# Patient Record
Sex: Male | Born: 1940 | Race: White | Hispanic: No | Smoking: Former smoker
Health system: Southern US, Community
[De-identification: ages and names within clinical notes are randomized; demographics above are authoritative.]

## PROBLEM LIST (undated history)

## (undated) DIAGNOSIS — I8393 Asymptomatic varicose veins of bilateral lower extremities: Secondary | ICD-10-CM

## (undated) DIAGNOSIS — F419 Anxiety disorder, unspecified: Secondary | ICD-10-CM

## (undated) DIAGNOSIS — Z9889 Other specified postprocedural states: Secondary | ICD-10-CM

## (undated) DIAGNOSIS — B029 Zoster without complications: Secondary | ICD-10-CM

## (undated) DIAGNOSIS — Z803 Family history of malignant neoplasm of breast: Secondary | ICD-10-CM

## (undated) DIAGNOSIS — M1990 Unspecified osteoarthritis, unspecified site: Secondary | ICD-10-CM

## (undated) DIAGNOSIS — R519 Headache, unspecified: Secondary | ICD-10-CM

## (undated) DIAGNOSIS — E039 Hypothyroidism, unspecified: Secondary | ICD-10-CM

## (undated) DIAGNOSIS — E785 Hyperlipidemia, unspecified: Secondary | ICD-10-CM

## (undated) DIAGNOSIS — N3281 Overactive bladder: Secondary | ICD-10-CM

## (undated) DIAGNOSIS — G4730 Sleep apnea, unspecified: Secondary | ICD-10-CM

## (undated) DIAGNOSIS — E079 Disorder of thyroid, unspecified: Secondary | ICD-10-CM

## (undated) DIAGNOSIS — C259 Malignant neoplasm of pancreas, unspecified: Secondary | ICD-10-CM

## (undated) DIAGNOSIS — I10 Essential (primary) hypertension: Secondary | ICD-10-CM

## (undated) DIAGNOSIS — Z801 Family history of malignant neoplasm of trachea, bronchus and lung: Secondary | ICD-10-CM

## (undated) DIAGNOSIS — Z8719 Personal history of other diseases of the digestive system: Secondary | ICD-10-CM

## (undated) DIAGNOSIS — J189 Pneumonia, unspecified organism: Secondary | ICD-10-CM

## (undated) DIAGNOSIS — F32A Depression, unspecified: Secondary | ICD-10-CM

## (undated) DIAGNOSIS — I7121 Aneurysm of the ascending aorta, without rupture: Secondary | ICD-10-CM

## (undated) DIAGNOSIS — I729 Aneurysm of unspecified site: Secondary | ICD-10-CM

## (undated) DIAGNOSIS — F329 Major depressive disorder, single episode, unspecified: Secondary | ICD-10-CM

## (undated) HISTORY — DX: Family history of malignant neoplasm of breast: Z80.3

## (undated) HISTORY — PX: ROTATOR CUFF REPAIR: SHX139

## (undated) HISTORY — DX: Depression, unspecified: F32.A

## (undated) HISTORY — DX: Family history of malignant neoplasm of trachea, bronchus and lung: Z80.1

## (undated) HISTORY — DX: Unspecified osteoarthritis, unspecified site: M19.90

## (undated) HISTORY — PX: JOINT REPLACEMENT: SHX530

## (undated) HISTORY — DX: Major depressive disorder, single episode, unspecified: F32.9

## (undated) HISTORY — DX: Hyperlipidemia, unspecified: E78.5

## (undated) HISTORY — PX: VARICOSE VEIN SURGERY: SHX832

## (undated) HISTORY — PX: ELBOW SURGERY: SHX618

## (undated) HISTORY — PX: BILATERAL CARPAL TUNNEL RELEASE: SHX6508

## (undated) HISTORY — PX: CATARACT EXTRACTION, BILATERAL: SHX1313

## (undated) HISTORY — PX: EYE SURGERY: SHX253

## (undated) MED FILL — Dexamethasone Sodium Phosphate Inj 100 MG/10ML: INTRAMUSCULAR | Qty: 1 | Status: AC

---
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
## 1999-02-02 ENCOUNTER — Ambulatory Visit (HOSPITAL_COMMUNITY): Admission: RE | Admit: 1999-02-02 | Discharge: 1999-02-02 | Payer: Self-pay | Admitting: Internal Medicine

## 2005-01-19 ENCOUNTER — Ambulatory Visit: Payer: Self-pay | Admitting: Internal Medicine

## 2005-01-25 ENCOUNTER — Ambulatory Visit: Payer: Self-pay | Admitting: Internal Medicine

## 2005-04-04 ENCOUNTER — Ambulatory Visit: Payer: Self-pay | Admitting: Internal Medicine

## 2006-04-24 ENCOUNTER — Ambulatory Visit: Payer: Self-pay | Admitting: Internal Medicine

## 2006-07-11 ENCOUNTER — Ambulatory Visit: Payer: Self-pay | Admitting: Internal Medicine

## 2006-07-11 LAB — CONVERTED CEMR LAB
AST: 29 units/L (ref 0–37)
Albumin: 3.8 g/dL (ref 3.5–5.2)
Alkaline Phosphatase: 94 units/L (ref 39–117)
Bilirubin, Direct: 0.2 mg/dL (ref 0.0–0.3)
Chol/HDL Ratio, serum: 5.1
Cholesterol: 191 mg/dL (ref 0–200)
LDL Cholesterol: 124 mg/dL — ABNORMAL HIGH (ref 0–99)
Total Bilirubin: 0.8 mg/dL (ref 0.3–1.2)
Total CK: 301 units/L (ref 7–195)
Triglyceride fasting, serum: 150 mg/dL — ABNORMAL HIGH (ref 0–149)
VLDL: 30 mg/dL (ref 0–40)

## 2006-12-25 ENCOUNTER — Ambulatory Visit: Payer: Self-pay | Admitting: Internal Medicine

## 2007-04-12 ENCOUNTER — Emergency Department (HOSPITAL_COMMUNITY): Admission: EM | Admit: 2007-04-12 | Discharge: 2007-04-12 | Payer: Self-pay | Admitting: Family Medicine

## 2007-06-13 ENCOUNTER — Encounter: Payer: Self-pay | Admitting: Internal Medicine

## 2007-10-04 ENCOUNTER — Ambulatory Visit: Payer: Self-pay | Admitting: Internal Medicine

## 2007-10-04 DIAGNOSIS — F411 Generalized anxiety disorder: Secondary | ICD-10-CM

## 2007-10-04 DIAGNOSIS — F329 Major depressive disorder, single episode, unspecified: Secondary | ICD-10-CM

## 2007-10-04 DIAGNOSIS — Z860100 Personal history of colon polyps, unspecified: Secondary | ICD-10-CM | POA: Insufficient documentation

## 2007-10-04 DIAGNOSIS — K219 Gastro-esophageal reflux disease without esophagitis: Secondary | ICD-10-CM

## 2007-10-04 DIAGNOSIS — E785 Hyperlipidemia, unspecified: Secondary | ICD-10-CM

## 2007-11-12 ENCOUNTER — Ambulatory Visit: Payer: Self-pay | Admitting: Internal Medicine

## 2007-11-20 ENCOUNTER — Ambulatory Visit: Payer: Self-pay | Admitting: Internal Medicine

## 2007-11-20 ENCOUNTER — Encounter: Payer: Self-pay | Admitting: Internal Medicine

## 2008-05-21 ENCOUNTER — Encounter: Payer: Self-pay | Admitting: Internal Medicine

## 2008-10-09 ENCOUNTER — Telehealth (INDEPENDENT_AMBULATORY_CARE_PROVIDER_SITE_OTHER): Payer: Self-pay | Admitting: *Deleted

## 2008-11-07 ENCOUNTER — Ambulatory Visit: Payer: Self-pay | Admitting: Internal Medicine

## 2008-11-07 DIAGNOSIS — K5730 Diverticulosis of large intestine without perforation or abscess without bleeding: Secondary | ICD-10-CM | POA: Insufficient documentation

## 2008-11-07 DIAGNOSIS — I10 Essential (primary) hypertension: Secondary | ICD-10-CM | POA: Insufficient documentation

## 2008-11-07 DIAGNOSIS — E0590 Thyrotoxicosis, unspecified without thyrotoxic crisis or storm: Secondary | ICD-10-CM | POA: Insufficient documentation

## 2008-11-12 ENCOUNTER — Encounter: Payer: Self-pay | Admitting: Internal Medicine

## 2008-11-27 ENCOUNTER — Ambulatory Visit: Payer: Self-pay | Admitting: Endocrinology

## 2008-12-30 ENCOUNTER — Encounter (HOSPITAL_COMMUNITY): Admission: RE | Admit: 2008-12-30 | Discharge: 2009-02-11 | Payer: Self-pay | Admitting: Endocrinology

## 2009-01-14 ENCOUNTER — Ambulatory Visit (HOSPITAL_COMMUNITY): Admission: RE | Admit: 2009-01-14 | Discharge: 2009-01-14 | Payer: Self-pay | Admitting: Endocrinology

## 2009-05-25 ENCOUNTER — Ambulatory Visit: Payer: Self-pay | Admitting: Internal Medicine

## 2009-05-25 DIAGNOSIS — R490 Dysphonia: Secondary | ICD-10-CM | POA: Insufficient documentation

## 2009-05-25 DIAGNOSIS — J309 Allergic rhinitis, unspecified: Secondary | ICD-10-CM | POA: Insufficient documentation

## 2009-06-11 ENCOUNTER — Telehealth: Payer: Self-pay | Admitting: Internal Medicine

## 2009-06-11 DIAGNOSIS — J029 Acute pharyngitis, unspecified: Secondary | ICD-10-CM | POA: Insufficient documentation

## 2009-06-25 ENCOUNTER — Encounter: Payer: Self-pay | Admitting: Internal Medicine

## 2009-07-06 ENCOUNTER — Ambulatory Visit: Payer: Self-pay | Admitting: Internal Medicine

## 2009-07-09 ENCOUNTER — Inpatient Hospital Stay (HOSPITAL_COMMUNITY): Admission: EM | Admit: 2009-07-09 | Discharge: 2009-07-11 | Payer: Self-pay | Admitting: Emergency Medicine

## 2009-07-15 ENCOUNTER — Ambulatory Visit: Payer: Self-pay | Admitting: Internal Medicine

## 2009-07-15 DIAGNOSIS — R5383 Other fatigue: Secondary | ICD-10-CM

## 2009-07-15 DIAGNOSIS — R5381 Other malaise: Secondary | ICD-10-CM

## 2009-07-15 DIAGNOSIS — E039 Hypothyroidism, unspecified: Secondary | ICD-10-CM

## 2009-11-30 ENCOUNTER — Ambulatory Visit: Payer: Self-pay | Admitting: Internal Medicine

## 2010-04-20 ENCOUNTER — Ambulatory Visit (HOSPITAL_BASED_OUTPATIENT_CLINIC_OR_DEPARTMENT_OTHER): Admission: RE | Admit: 2010-04-20 | Discharge: 2010-04-20 | Payer: Self-pay | Admitting: Orthopedic Surgery

## 2010-06-04 ENCOUNTER — Ambulatory Visit (HOSPITAL_BASED_OUTPATIENT_CLINIC_OR_DEPARTMENT_OTHER): Admission: RE | Admit: 2010-06-04 | Discharge: 2010-06-04 | Payer: Self-pay | Admitting: Orthopedic Surgery

## 2010-09-12 LAB — CONVERTED CEMR LAB
ALT: 20 units/L (ref 0–53)
Albumin: 3.9 g/dL (ref 3.5–5.2)
Alkaline Phosphatase: 80 units/L (ref 39–117)
Alkaline Phosphatase: 96 units/L (ref 39–117)
BUN: 13 mg/dL (ref 6–23)
Basophils Absolute: 0 10*3/uL (ref 0.0–0.1)
Basophils Relative: 0.1 % (ref 0.0–3.0)
Bilirubin, Direct: 0.1 mg/dL (ref 0.0–0.3)
Bilirubin, Direct: 0.1 mg/dL (ref 0.0–0.3)
CO2: 29 meq/L (ref 19–32)
Calcium: 9.1 mg/dL (ref 8.4–10.5)
Calcium: 9.2 mg/dL (ref 8.4–10.5)
Cholesterol: 239 mg/dL (ref 0–200)
Creatinine, Ser: 1.1 mg/dL (ref 0.4–1.5)
Direct LDL: 171.8 mg/dL
Eosinophils Absolute: 0.3 10*3/uL (ref 0.0–0.7)
Eosinophils Relative: 6.7 % — ABNORMAL HIGH (ref 0.0–5.0)
GFR calc non Af Amer: 64 mL/min
HCT: 40.2 % (ref 39.0–52.0)
HDL: 38.1 mg/dL — ABNORMAL LOW (ref >39.00)
Hemoglobin, Urine: NEGATIVE
Hemoglobin: 13.7 g/dL (ref 13.0–17.0)
Ketones, ur: NEGATIVE mg/dL
Leukocytes, UA: NEGATIVE
Lymphocytes Relative: 38.2 % (ref 12.0–46.0)
Lymphocytes Relative: 42.6 % (ref 12.0–46.0)
MCHC: 34.1 g/dL (ref 30.0–36.0)
MCV: 80.3 fL (ref 78.0–100.0)
Monocytes Absolute: 0.7 10*3/uL (ref 0.1–1.0)
Monocytes Relative: 11.7 % — ABNORMAL HIGH (ref 3.0–11.0)
Neutrophils Relative %: 39.2 % — ABNORMAL LOW (ref 43.0–77.0)
Neutrophils Relative %: 41.8 % — ABNORMAL LOW (ref 43.0–77.0)
PSA: 2.31 ng/mL (ref 0.10–4.00)
PSA: 2.72 ng/mL (ref 0.10–4.00)
Platelets: 191 10*3/uL (ref 150.0–400.0)
RBC: 4.95 M/uL (ref 4.22–5.81)
RDW: 14.3 % (ref 11.5–14.6)
Sodium: 140 meq/L (ref 135–145)
Specific Gravity, Urine: 1.015 (ref 1.000–1.030)
TSH: 0.05 microintl units/mL — ABNORMAL LOW (ref 0.35–5.50)
TSH: 4.17 microintl units/mL (ref 0.35–5.50)
Total Bilirubin: 1 mg/dL (ref 0.3–1.2)
Total CHOL/HDL Ratio: 6
Total Protein: 6.9 g/dL (ref 6.0–8.3)
Triglycerides: 110 mg/dL (ref 0.0–149.0)
Triglycerides: 132 mg/dL (ref 0–149)
Urine Glucose: NEGATIVE mg/dL
Urobilinogen, UA: 0.2 (ref 0.0–1.0)
VLDL: 26 mg/dL (ref 0–40)
WBC: 5 10*3/uL (ref 4.5–10.5)
WBC: 5.1 10*3/uL (ref 4.5–10.5)

## 2010-10-04 NOTE — Op Note (Signed)
NAME:  Angel Keller, Angel Keller                 ACCOUNT NO.:  [id number]  MEDICAL RECORD NO.:  [id number]          PATIENT TYPE:  AMB  LOCATION:  DSC                          FACILITY:  [facility]  PHYSICIAN:  Cindee Salt, M.D.       DATE OF BIRTH:  18-Mar-1941  DATE OF PROCEDURE:  06/04/2010 DATE OF DISCHARGE:                              OPERATIVE REPORT   PREOPERATIVE DIAGNOSES:  Carpal tunnel syndrome, right hand, cubital tunnel syndrome, right elbow.  POSTOPERATIVE DIAGNOSIS:  Carpal tunnel syndrome, right hand, cubital tunnel syndrome, right elbow.  OPERATION:  Decompression median nerve at the wrist, decompression ulnar nerve at the elbow, right arm.  SURGEON:  Cindee Salt, MD  ANESTHESIA:  General with local infiltration.  ANESTHESIOLOGIST:  Janetta Hora. Gelene Mink, MD  HISTORY:  The patient is a 70 year old male with a history of carpal tunnel syndrome, cubital tunnel syndrome, nerve conductions positive. This has not responded to conservative treatment.  He has elected to undergo surgical decompression.  Pre, peri, and postoperative course are discussed along with risks and complications.  He is aware that there is no guarantee with the surgery, possibility of infection, recurrence injury to arteries, nerves, tendons, incomplete relief of symptoms, dystrophy.  In the preoperative area, the patient is seen.  The extremity marked by both the patient and surgeon.  Antibiotics given.  PROCEDURE:  The patient was brought to the operating room where a general anesthetic was carried out without difficulty under the direction of Dr. Gelene Mink.  He was prepped using ChloraPrep, supine position, right arm free.  A 3-minute dry time was allowed.  Time-out taken confirming the patient and procedure.  The limb was exsanguinated with an Esmarch bandage, tourniquet placed high, and the arm was inflated 250 mmHg.  A longitudinal incision was made in the right palm, carried down through the  subcutaneous tissue.  Bleeders were electrocauterized.  Palmar fascia was split.  Superficial palmar arch identified.  The flexor tendon to the ring little finger identified. The ulnar side of median nerve carpal retinaculum was incised with sharp dissection.  A right-angle and Sewall retractor were placed between skin and forearm fascia.  The fascia was released for approximately 1.5 cm proximal to the wrist crease under direct vision.  The canal was explored.  Air compression to the nerve was apparent.  No further lesions were identified.  The wound was irrigated.  The skin was then closed with interrupted 5-0 Vicryl Rapide sutures.  Local infiltration was given with 0.25% Marcaine without epinephrine, approximately 5 mL was used.  A separate incision was then made over the medial epicondyle of the right elbow, carried down through the subcutaneous tissue. Bleeders again electrocauterized with bipolar.  Dissection carried down to [employer].  This was identified.  This was released in its most posterior aspect.  This allowed visualization of the ulnar nerve.  A subcutaneous dissection was then carried between the fascia, the subcutaneous fatty tissue.  Two retractors were placed allowing visualization of the flexor carpi ulnaris.  Flexor carpi ulnaris fasciotomy was then performed.  The muscle was then carefully dissected along its planes  separating the 2 muscle bellies.  The deep fascia was then released after placement of a KMI carpal tunnel retractor and the deep fascia cut with an ENT scissors.  The dissection was then carried proximally.  The knee retractor was placed after a subcutaneous dissection to the fascia.  The ulnar nerve was identified proximally. The KMI retractor placed on top of the nerve.  This was then retracted posteriorly and the fascia was released again with the ENT scissors for approximately 5-6 cm proximally.  The elbow placed through full flexion. No  subluxation to the ulnar nerve was noted.  The wounds were copiously irrigated with saline.  The Osborne fascia was then sutured to the skin, posterior flap with figure-of-eight 4-0 Vicryl sutures, and the skin with a subcuticular 4-0 Vicryl Rapide suture.  A local infiltration was then given with 0.25% Marcaine without epinephrine.  A sterile compressive dressing and long-arm splint applied with the elbow flexed approximately 30 degrees.  On deflation of the tourniquet, all fingers immediately pinked.  He was taken to the recovery room for observation in a satisfactory condition.  He will be discharged home to return to the [facility] of [place] in 1 week on Vicodin.          ______________________________ Cindee Salt, M.D.     GK/MEDQ  D:  06/04/2010  T:  06/05/2010  Job:  [id number]  cc:   Corwin Levins, MD  Electronically Signed by Cindee Salt M.D. on 10/04/2010 12:05:22 PM

## 2010-10-04 NOTE — Op Note (Signed)
NAME:  Angel Keller, Angel Keller                 ACCOUNT NO.:  [id number]  MEDICAL RECORD NO.:  [id number]          PATIENT TYPE:  AMB  LOCATION:  DSC                          FACILITY:  [facility]  PHYSICIAN:  Cindee Salt, M.D.       DATE OF BIRTH:  May 16, 1941  DATE OF PROCEDURE:  04/20/2010 DATE OF DISCHARGE:                              OPERATIVE REPORT  PREOPERATIVE DIAGNOSES:  Carpal tunnel syndrome, left hand.  Cubital tunnel syndrome, left elbow.  POSTOPERATIVE DIAGNOSES:  Carpal tunnel syndrome, left hand.  Cubital tunnel syndrome, left elbow.  OPERATION:  Decompression of left median nerve at the wrist and the ulnar nerve at the elbow.  SURGEON:  Cindee Salt, MD  ANESTHESIA:  General with local infiltration.  ANESTHESIOLOGIST:  Quita Skye. Krista Blue, MD  HISTORY:  The patient is a 70 year old male with history of carpal tunnel syndrome and cubital tunnel syndrome, left arm.  EMG nerve conductions are positive.  He does show moderate atrophy to the thenar intrinsics and intrinsics to his hand, but no clawing.  He is admitted for decompression, possible transposition to the ulnar nerve, decompression to the median nerve at the wrist.  Pre and postoperative course have been discussed along with risks and complications.  He is aware that there is no guarantee with surgery, possibility of infection, recurrence, injury to arteries, nerves, and tendons, incomplete relief of symptoms, and dystrophy.  In the preoperative area, the patient is seen.  The extremity marked by both the patient and surgeon.  Antibiotic given.  PROCEDURE:  The patient is brought to the operating room where a general anesthetic was carried out without difficulty under the direction of Dr. Krista Blue.  He was prepped using DuraPrep, supine position, left arm free. A 3-minute dry time was allowed.  Time-out taken confirming the patient and procedure.  A longitudinal incision was made in the palm, carried down through  subcutaneous tissue after exsanguination of the limb with an Esmarch bandage and inflation of tourniquet to 250 mmHg.  Bleeders were electrocauterized with bipolar.  The palmar fascia was split, superficial palmar arch identified, flexor tendon of the ring and little finger identified to the ulnar side of median nerve.  Carpal retinaculum was incised with sharp dissection.  Right angle and Sewall retractor were placed between skin and forearm fascia.  Fascia released for approximately a centimeter and half proximal to the wrist crease under direct vision.  Canal was explored.  Air compression to the nerve was apparent.  No further lesions were identified.  The wound was irrigated. Skin was closed with interrupted 5-0 Vicryl Rapide sutures.  A separate incision was then made in the medial epicondyle of the left elbow, carried down through subcutaneous tissue.  Bleeders again electrocauterized with bipolar.  The dissection was carried down to the posterior aspect of Osborne fascia.  This was incised, left attached to the epicondyle.  The ulnar nerve was identified.  A subcutaneous dissection between fat and fascia was then proceeded distally over the flexor carpi ulnaris.  Two knee retractors were then placed.  A fasciotomy was then performed to the flexor carpi ulnaris.  The muscle belly was then split with a blunt dissection.  A KMI skid for carpal tunnel release was then placed between the ulnar nerve and the deep fascia of the flexor carpi ulnaris using an angled ENT straight scissors, the fascia was released distally.  This was done for approximately 6 cm distally.  The dissection was then carried proximally again with blunt and sharp dissection.  The subcutaneous tissue was dissected free of the overlying fascia and the ulnar nerve proximally. The fascia was then released after placing the [facility] skid between the ulnar nerve and the fascia.  This was done with the angled ENT  scissors. The nerve was released proximally and distally to palpation.  With flexion of the elbow, no subluxation was noted.  The wound was irrigated and the subcutaneous tissue was closed with interrupted 2-0 Vicryl and skin with interrupted 4-0 Vicryl Rapide sutures.  Sterile compressive dressing and long-arm splint with the fingers free was applied.  The elbow was placed in approximately 40 degrees of flexion.  On deflation of the tourniquet, all fingers immediately pinked.  He was taken to the recovery room for observation in satisfactory condition.  He will be discharged home to return to [facility] [place] in 1 week on Vicodin.          ______________________________ Cindee Salt, M.D.    GK/MEDQ  D:  04/20/2010  T:  04/21/2010  Job:  [id number]  cc:   Oliver Barre, MD  Electronically Signed by Cindee Salt M.D. on 10/04/2010 12:05:19 PM

## 2010-10-27 LAB — BASIC METABOLIC PANEL
BUN: 14 mg/dL (ref 6–23)
CO2: 24 mEq/L (ref 19–32)
Chloride: 106 mEq/L (ref 96–112)
Glucose, Bld: 143 mg/dL — ABNORMAL HIGH (ref 70–99)
Potassium: 4.2 mEq/L (ref 3.5–5.1)
Sodium: 138 mEq/L (ref 135–145)

## 2010-10-28 LAB — BASIC METABOLIC PANEL
BUN: 16 mg/dL (ref 6–23)
CO2: 26 mEq/L (ref 19–32)
Chloride: 106 mEq/L (ref 96–112)
GFR calc non Af Amer: >60 mL/min (ref >60)
Glucose, Bld: 108 mg/dL — ABNORMAL HIGH (ref 70–99)
Potassium: 4.2 mEq/L (ref 3.5–5.1)
Sodium: 137 mEq/L (ref 135–145)

## 2010-11-17 LAB — BASIC METABOLIC PANEL
BUN: 10 mg/dL (ref 6–23)
BUN: 14 mg/dL (ref 6–23)
BUN: 8 mg/dL (ref 6–23)
CO2: 28 mEq/L (ref 19–32)
Calcium: 8.8 mg/dL (ref 8.4–10.5)
Chloride: 101 mEq/L (ref 96–112)
Chloride: 98 mEq/L (ref 96–112)
Creatinine, Ser: 1.69 mg/dL — ABNORMAL HIGH (ref 0.4–1.5)
Creatinine, Ser: 2.09 mg/dL — ABNORMAL HIGH (ref 0.4–1.5)
GFR calc non Af Amer: 40 mL/min — ABNORMAL LOW (ref >60)
Glucose, Bld: 102 mg/dL — ABNORMAL HIGH (ref 70–99)
Glucose, Bld: 122 mg/dL — ABNORMAL HIGH (ref 70–99)
Glucose, Bld: 97 mg/dL (ref 70–99)
Potassium: 3.7 mEq/L (ref 3.5–5.1)
Potassium: 3.9 mEq/L (ref 3.5–5.1)

## 2010-11-17 LAB — GLUCOSE, CAPILLARY: Glucose-Capillary: 135 mg/dL — ABNORMAL HIGH (ref 70–99)

## 2010-11-17 LAB — URINALYSIS, ROUTINE W REFLEX MICROSCOPIC
Glucose, UA: NEGATIVE mg/dL
Hgb urine dipstick: NEGATIVE
Ketones, ur: 15 mg/dL — AB
Protein, ur: NEGATIVE mg/dL
pH: 5 (ref 5.0–8.0)

## 2010-11-17 LAB — CBC
HCT: 38.3 % — ABNORMAL LOW (ref 39.0–52.0)
HCT: 40.7 % (ref 39.0–52.0)
MCHC: 35.3 g/dL (ref 30.0–36.0)
MCV: 87.2 fL (ref 78.0–100.0)
MCV: 88.7 fL (ref 78.0–100.0)
Platelets: 157 10*3/uL (ref 150–400)
Platelets: 167 10*3/uL (ref 150–400)
WBC: 4.6 10*3/uL (ref 4.0–10.5)
WBC: 5.2 10*3/uL (ref 4.0–10.5)

## 2010-11-17 LAB — CK TOTAL AND CKMB (NOT AT ARMC)
CK, MB: 46 ng/mL — ABNORMAL HIGH (ref 0.3–4.0)
Relative Index: 1 (ref 0.0–2.5)

## 2010-11-17 LAB — HEPATIC FUNCTION PANEL
AST: 153 U/L — ABNORMAL HIGH (ref 0–37)
Albumin: 4 g/dL (ref 3.5–5.2)
Total Protein: 7.5 g/dL (ref 6.0–8.3)

## 2010-11-17 LAB — CARDIAC PANEL(CRET KIN+CKTOT+MB+TROPI): Relative Index: 0.9 (ref 0.0–2.5)

## 2010-11-17 LAB — LIPID PANEL
HDL: 44 mg/dL (ref >39)
Total CHOL/HDL Ratio: 5.8 RATIO
Triglycerides: 264 mg/dL — ABNORMAL HIGH (ref <150)
VLDL: 53 mg/dL — ABNORMAL HIGH (ref 0–40)

## 2010-11-17 LAB — DIFFERENTIAL
Basophils Relative: 1 % (ref 0–1)
Eosinophils Absolute: 0.4 10*3/uL (ref 0.0–0.7)
Eosinophils Relative: 8 % — ABNORMAL HIGH (ref 0–5)
Lymphs Abs: 1.3 10*3/uL (ref 0.7–4.0)
Monocytes Relative: 10 % (ref 3–12)

## 2010-11-17 LAB — POCT CARDIAC MARKERS
Myoglobin, poc: >500 ng/mL (ref 12–200)
Troponin i, poc: <0.05 ng/mL (ref 0.00–0.09)

## 2010-11-17 LAB — MAGNESIUM: Magnesium: 2.7 mg/dL — ABNORMAL HIGH (ref 1.5–2.5)

## 2010-11-17 LAB — TSH: TSH: 137.15 u[IU]/mL — ABNORMAL HIGH (ref 0.350–4.500)

## 2014-10-22 ENCOUNTER — Encounter: Payer: Self-pay | Admitting: Internal Medicine

## 2015-03-03 ENCOUNTER — Encounter: Payer: Self-pay | Admitting: Internal Medicine

## 2015-05-07 ENCOUNTER — Encounter: Payer: Self-pay | Admitting: Internal Medicine

## 2015-09-08 ENCOUNTER — Other Ambulatory Visit: Payer: Self-pay | Admitting: Family Medicine

## 2015-09-08 DIAGNOSIS — R519 Headache, unspecified: Secondary | ICD-10-CM

## 2015-09-08 DIAGNOSIS — R51 Headache: Principal | ICD-10-CM

## 2015-09-14 ENCOUNTER — Ambulatory Visit
Admission: RE | Admit: 2015-09-14 | Discharge: 2015-09-14 | Disposition: A | Discharge status: Home / Self Care | Payer: Medicare Other | Source: Ambulatory Visit | Attending: Family Medicine | Admitting: Family Medicine

## 2015-09-14 DIAGNOSIS — R51 Headache: Principal | ICD-10-CM

## 2015-09-14 DIAGNOSIS — R519 Headache, unspecified: Secondary | ICD-10-CM

## 2015-09-15 ENCOUNTER — Emergency Department (HOSPITAL_COMMUNITY)
Admission: EM | Admit: 2015-09-15 | Discharge: 2015-09-15 | Disposition: A | Discharge status: Home / Self Care | Payer: Medicare Other | Attending: Emergency Medicine | Admitting: Emergency Medicine

## 2015-09-15 ENCOUNTER — Telehealth (HOSPITAL_COMMUNITY): Payer: Self-pay

## 2015-09-15 ENCOUNTER — Encounter (HOSPITAL_COMMUNITY): Payer: Self-pay | Admitting: Emergency Medicine

## 2015-09-15 DIAGNOSIS — R42 Dizziness and giddiness: Secondary | ICD-10-CM | POA: Insufficient documentation

## 2015-09-15 DIAGNOSIS — M542 Cervicalgia: Secondary | ICD-10-CM | POA: Insufficient documentation

## 2015-09-15 DIAGNOSIS — R51 Headache: Secondary | ICD-10-CM | POA: Insufficient documentation

## 2015-09-15 DIAGNOSIS — R519 Headache, unspecified: Secondary | ICD-10-CM

## 2015-09-15 DIAGNOSIS — Z79899 Other long term (current) drug therapy: Secondary | ICD-10-CM | POA: Insufficient documentation

## 2015-09-15 DIAGNOSIS — E079 Disorder of thyroid, unspecified: Secondary | ICD-10-CM | POA: Diagnosis not present

## 2015-09-15 DIAGNOSIS — I10 Essential (primary) hypertension: Secondary | ICD-10-CM | POA: Diagnosis not present

## 2015-09-15 DIAGNOSIS — Z7982 Long term (current) use of aspirin: Secondary | ICD-10-CM | POA: Diagnosis not present

## 2015-09-15 HISTORY — DX: Essential (primary) hypertension: I10

## 2015-09-15 HISTORY — DX: Disorder of thyroid, unspecified: E07.9

## 2015-09-15 NOTE — Discharge Instructions (Signed)
As discussed, our interventional radiologist will contact you in the morning regarding the next steps in evaluation for your aneurism.  Please call them if you are not contacted.  Return here for concerning changes in your condition.

## 2015-09-15 NOTE — ED Provider Notes (Signed)
CSN: [id number]     Arrival date & time 09/15/15  1422 History   First MD Initiated Contact with Patient 09/15/15 1553     Chief Complaint  Patient presents with  . Headache     HPI   Patient presents with concern of ongoing headache. Notably, the patient has been evaluated by his primary care physician, had MRI performed yesterday due to his headache. Patient is here with 2 children, who assist with the history of present illness. Over the past 3 months the patient has had new headache. Headache occurs with associated posterior head discomfort, and neck soreness, when the patient is exercising. With rest, symptoms resolved. During the exacerbations, there is no vision change, vomiting, asymmetric weakness. Prior to 3 months ago, the patient had no similar issues. Currently the patient has no symptoms. Yesterday, the patient had MRI. Today, he was informed that he has abnormal results and he is here for evaluation.   Past Medical History  Diagnosis Date  . Hypertension   . Thyroid disease    History reviewed. No pertinent past surgical history. History reviewed. No pertinent family history. Social History  Substance Use Topics  . Smoking status: Never Smoker   . Smokeless tobacco: None  . Alcohol Use: No    Review of Systems  Constitutional:       Per HPI, otherwise negative  HENT:       Per HPI, otherwise negative  Respiratory:       Per HPI, otherwise negative  Cardiovascular:       Per HPI, otherwise negative  Gastrointestinal: Negative for vomiting.  Endocrine:       Negative aside from HPI  Genitourinary:       Neg aside from HPI   Musculoskeletal:       Per HPI, otherwise negative  Skin: Negative.   Neurological: Positive for light-headedness and headaches. Negative for syncope.      Allergies  Review of patient's allergies indicates no known allergies.  Home Medications   Prior to Admission medications   Medication Sig Start Date End Date  Taking? Authorizing Provider  aspirin EC 325 MG tablet Take 325 mg by mouth daily.   Yes Historical Provider, MD  bisoprolol-hydrochlorothiazide (ZIAC) 2.5-6.25 MG tablet Take 1 tablet by mouth daily. 06/20/15  Yes Historical Provider, MD  citalopram (CELEXA) 20 MG tablet Take 20 mg by mouth daily. 06/17/15  Yes Historical Provider, MD  Cyanocobalamin (B-12 PO) Take 1 tablet by mouth daily.   Yes Historical Provider, MD  levothyroxine (SYNTHROID, LEVOTHROID) 150 MCG tablet Take 150 mcg by mouth daily. 09/07/15  Yes Historical Provider, MD  lovastatin (MEVACOR) 20 MG tablet Take 20 mg by mouth daily. 07/25/15  Yes Historical Provider, MD   BP 142/81 mmHg  Pulse 59  Temp(Src) 98.7 F (37.1 C) (Oral)  Resp 20  SpO2 97% Physical Exam  Constitutional: He is oriented to person, place, and time. He appears well-developed. No distress.  HENT:  Head: Normocephalic and atraumatic.  Eyes: Conjunctivae and EOM are normal.  Cardiovascular: Normal rate and regular rhythm.   Pulmonary/Chest: Effort normal. No stridor. No respiratory distress.  Abdominal: He exhibits no distension.  Musculoskeletal: He exhibits no edema.  Neurological: He is alert and oriented to person, place, and time.  Patient moves all extremities spontaneously, has appropriate grip strength, is awake, alert, oriented appropriately, with clear speech, no facial asymmetry  Skin: Skin is warm and dry.  Psychiatric: He has a normal mood and affect.  Nursing note and vitals reviewed.   ED Course  Procedures (including critical care time) Labs Review Labs Reviewed - No data to display  Imaging Review Mr Virgel Paling [id number] Contrast  09/15/2015  ADDENDUM REPORT: 09/15/2015 09:45 ADDENDUM: Study discussed by telephone with Dr. Jeneen Rinks LITTLE on 09/15/2015 at 0935 hours. I am arranging a follow-up conversation between Dr. [name] and Neurointerventionalist Dr. Juliet Rude. Electronically Signed   By: Genevie Ann M.D.   On: 09/15/2015  09:45  09/15/2015  CLINICAL DATA:  75 year old male with headaches after exercising at the gym, especially lifting weights. Initial encounter. EXAM: MRA HEAD WITHOUT CONTRAST TECHNIQUE: Angiographic images of the Circle of Willis were obtained using MRA technique without intravenous contrast. COMPARISON:  None. FINDINGS: Visible brain parenchyma with no ventriculomegaly or intracranial mass effect evident. Cerebral volume is within normal limits for age. Antegrade flow in the posterior fossa with codominant distal vertebral arteries. Normal PICA origins and vertebrobasilar junction. Normal AICA origins, basilar artery, SCA and PCA origins (shared right PCA and SCA origin common normal variant). Posterior communicating arteries are diminutive or absent. Bilateral PCA branches are within normal limits. Antegrade flow in both ICA siphons. No siphon stenosis. Ophthalmic artery origins appear normal. Patent carotid termini. Normal MCA and ACA origins. There is ectasia about the anterior communicating artery, but no associated saccular aneurysm. Mild ACA tortuosity. Visualized bilateral ACA branches otherwise are within normal limits. The left MCA M1 segment, bifurcation, and left MCA branches and visualized left MCA branches are within normal limits. Right MCA origin is within normal limits. Arising from the mid M1 segment and directed inferiorly and posteriorly there is a small 3 mm saccular aneurysm (series 2, image 76 and series 8, image 9). Distal to this the right MCA bifurcation appears normal. Visualized right MCA branches are within normal limits. IMPRESSION: 1. 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 Recommend Neuroendovascular consultation. 2. Ectasia of the anterior communicating artery but no additional intracranial aneurysm identified. 3. No intracranial stenosis. No intracranial atherosclerosis is evident. Electronically Signed: By: Genevie Ann M.D. On: 09/14/2015  17:41   I have personally reviewed and evaluated these images and lab results as part of my medical decision-making.  Update: I discussed the patient's case, MRI with our neuro interventionist. He indicates the patient can follow-up in the morning, and that they will arrange angiography for him, and he will be seen in the clinic tomorrow.  Update: I had a very lengthy conversation with the patient, 2 children about aneurysms today's findings, my conversation with neuro interventional radiology. I offered admission for monitoring overnight, with next day interventional evaluation versus discharge with outpatient follow-up, arranged by interventional radiology. Patient is asymptomatic, states that he would like to go home, will follow up tomorrow, states that he understands return precautions.   MDM   Final diagnoses:  Bad headache   aneurysm  Previously well male presents with 3 months of ongoing exertional headache. Here is awake, alert, asymptomatic. Patient has MRI performed as an outpatient demonstrated 3 mm MCA aneurysm. No evidence for bleed and absent any ongoing neurologic symptoms, there is low suspicion for occult bleed. After discussion with neuro interventional radiology, the patient was discharged in stable condition with next morning follow-up.    Carmin Muskrat, MD 09/15/15 [phone]

## 2015-09-15 NOTE — Telephone Encounter (Signed)
Called to schedule, left a message for pt to call back. AW

## 2015-09-15 NOTE — ED Notes (Signed)
Pt had MRI for HA, and tingling in arm which showed brain aneurysm; pt here for further eval

## 2015-09-16 ENCOUNTER — Ambulatory Visit (HOSPITAL_COMMUNITY)
Admission: RE | Admit: 2015-09-16 | Discharge: 2015-09-16 | Disposition: A | Discharge status: Home / Self Care | Payer: Medicare Other | Source: Ambulatory Visit | Attending: Interventional Radiology | Admitting: Interventional Radiology

## 2015-09-16 ENCOUNTER — Other Ambulatory Visit (HOSPITAL_COMMUNITY): Payer: Self-pay | Admitting: Interventional Radiology

## 2015-09-16 DIAGNOSIS — I671 Cerebral aneurysm, nonruptured: Secondary | ICD-10-CM

## 2015-09-19 ENCOUNTER — Emergency Department (HOSPITAL_COMMUNITY)
Admission: EM | Admit: 2015-09-19 | Discharge: 2015-09-19 | Disposition: A | Discharge status: Home / Self Care | Payer: Medicare Other | Attending: Emergency Medicine | Admitting: Emergency Medicine

## 2015-09-19 ENCOUNTER — Encounter (HOSPITAL_COMMUNITY): Payer: Self-pay | Admitting: Emergency Medicine

## 2015-09-19 DIAGNOSIS — B0233 Zoster keratitis: Secondary | ICD-10-CM | POA: Diagnosis not present

## 2015-09-19 DIAGNOSIS — E039 Hypothyroidism, unspecified: Secondary | ICD-10-CM | POA: Insufficient documentation

## 2015-09-19 DIAGNOSIS — Z79899 Other long term (current) drug therapy: Secondary | ICD-10-CM | POA: Diagnosis not present

## 2015-09-19 DIAGNOSIS — I10 Essential (primary) hypertension: Secondary | ICD-10-CM | POA: Diagnosis not present

## 2015-09-19 DIAGNOSIS — R21 Rash and other nonspecific skin eruption: Secondary | ICD-10-CM | POA: Diagnosis present

## 2015-09-19 DIAGNOSIS — Z7982 Long term (current) use of aspirin: Secondary | ICD-10-CM | POA: Insufficient documentation

## 2015-09-19 HISTORY — DX: Zoster without complications: B02.9

## 2015-09-19 HISTORY — DX: Aneurysm of unspecified site: I72.9

## 2015-09-19 MED ORDER — OXYCODONE-ACETAMINOPHEN 5-325 MG PO TABS: 1.0000 | ORAL_TABLET | Freq: Four times a day (QID) | ORAL | Status: DC | PRN

## 2015-09-19 MED ORDER — FLUORESCEIN SODIUM 1 MG OP STRP
1.0000 | ORAL_STRIP | Freq: Once | OPHTHALMIC | Status: AC
  Administered 2015-09-19: 1 via OPHTHALMIC
  Filled 2015-09-19: qty 1

## 2015-09-19 MED ORDER — VALACYCLOVIR HCL 1 G PO TABS: 1000.0000 mg | ORAL_TABLET | Freq: Three times a day (TID) | ORAL | Status: DC

## 2015-09-19 MED ORDER — PREDNISONE 20 MG PO TABS: 40.0000 mg | ORAL_TABLET | Freq: Every day | ORAL | Status: DC

## 2015-09-19 MED ORDER — ERYTHROMYCIN 5 MG/GM OP OINT: TOPICAL_OINTMENT | OPHTHALMIC | Status: DC

## 2015-09-19 MED ORDER — ONDANSETRON 4 MG PO TBDP
4.0000 mg | ORAL_TABLET | Freq: Once | ORAL | Status: AC
  Administered 2015-09-19: 4 mg via ORAL
  Filled 2015-09-19: qty 1

## 2015-09-19 MED ORDER — OXYCODONE-ACETAMINOPHEN 5-325 MG PO TABS
1.0000 | ORAL_TABLET | Freq: Once | ORAL | Status: AC
Start: 2015-09-19 — End: 2015-09-19
  Administered 2015-09-19: 1 via ORAL
  Filled 2015-09-19: qty 1

## 2015-09-19 MED ORDER — VALACYCLOVIR HCL 500 MG PO TABS
1000.0000 mg | ORAL_TABLET | Freq: Once | ORAL | Status: AC
  Administered 2015-09-19: 1000 mg via ORAL
  Filled 2015-09-19: qty 2

## 2015-09-19 MED ORDER — TETRACAINE HCL 0.5 % OP SOLN
1.0000 [drp] | Freq: Once | OPHTHALMIC | Status: AC
  Administered 2015-09-19: 1 [drp] via OPHTHALMIC
  Filled 2015-09-19: qty 2

## 2015-09-19 NOTE — ED Provider Notes (Signed)
CSN: [id number]     Arrival date & time 09/19/15  1313 History  By signing my name below, I, [name], attest that this documentation has been prepared under the direction and in the presence of Margarita Mail, PA-C. Electronically Signed: [name], ED Scribe. 09/19/2015. 2:33 PM.    Chief Complaint  Patient presents with  . Herpes Zoster  . Aneurysm   The history is provided by the patient, the spouse and a relative. No language interpreter was used.    HPI Comments: Angel Keller is a 75 y.o. male who presents to the Emergency Department complaining of a worsening, blistering, rash to his superior scalp onset 1 week ago. He was initially seen on 1/31 for an ongoing HA and had an MRI performed demonstrating a 11mm MCA aneurysm and has a clipping surgery scheduled with Dr. Estanislado Pandy. Pt now notes a worsening, 8/10, painful rash to his scalp. He is not on any steroid medications. Pt is currently taking medication for his HTN.   PCP: Dr. Tamsen Roers ([place])  Past Medical History  Diagnosis Date  . Hypertension   . Thyroid disease   . Shingles   . Aneurysm ([place])    History reviewed. No pertinent past surgical history. No family history on file. Social History  Substance Use Topics  . Smoking status: Never Smoker   . Smokeless tobacco: None  . Alcohol Use: No    Review of Systems  Skin: Positive for color change, rash and wound.  All other systems reviewed and are negative.   Allergies  Review of patient's allergies indicates no known allergies.  Home Medications   Prior to Admission medications   Medication Sig Start Date End Date Taking? Authorizing Provider  aspirin EC 325 MG tablet Take 325 mg by mouth daily.    Historical Provider, MD  bisoprolol-hydrochlorothiazide (ZIAC) 2.5-6.25 MG tablet Take 1 tablet by mouth daily. 06/20/15   Historical Provider, MD  citalopram (CELEXA) 20 MG tablet Take 20 mg by mouth daily. 06/17/15   Historical  Provider, MD  Cyanocobalamin (B-12 PO) Take 1 tablet by mouth daily.    Historical Provider, MD  levothyroxine (SYNTHROID, LEVOTHROID) 150 MCG tablet Take 150 mcg by mouth daily. 09/07/15   Historical Provider, MD  lovastatin (MEVACOR) 20 MG tablet Take 20 mg by mouth daily. 07/25/15   Historical Provider, MD   BP 145/77 mmHg  Pulse 56  Temp(Src) 97.2 F (36.2 C) (Oral)  Resp 16  Ht 5\' 10"  (1.778 m)  Wt 165 lb 8 oz (75.07 kg)  BMI 23.75 kg/m2  SpO2 97%    Physical Exam  Constitutional: He is oriented to person, place, and time. He appears well-developed.  HENT:  Head: Normocephalic and atraumatic.  Eyes: EOM are normal. Right eye exhibits no chemosis and no exudate. Left eye exhibits no chemosis and no exudate. Right conjunctiva is injected. Left conjunctiva is not injected.  Slit lamp exam:      The right eye shows no corneal flare, no corneal ulcer and no fluorescein uptake.       The left eye shows no corneal flare and no corneal ulcer.  No dendritic lesions.  Pressure  OD:13 OS 13  Neck: Normal range of motion.  Cardiovascular: Normal rate.   Pulmonary/Chest: Effort normal. No respiratory distress.  Abdominal: Soft.  Musculoskeletal: Normal range of motion.  Neurological: He is alert and oriented to person, place, and time.  Skin: Skin is warm and dry. Rash noted. There  is erythema.  Vesicular and crusting eruption on the left parietal and temporal scalp region extending to the face over the right eye  Psychiatric: He has a normal mood and affect.  Nursing note and vitals reviewed.   ED Course  Procedures  DIAGNOSTIC STUDIES: Oxygen Saturation is 97% on RA, normal by my interpretation.    COORDINATION OF CARE: 2:31 PM Discussed next steps with pt and his family. He agreed to the plan.   Labs Review Labs Reviewed - No data to display  Imaging Review No results found. I have personally reviewed and evaluated these images and lab results as part of my medical  decision-making.   EKG Interpretation None      MDM   Final diagnoses:  Herpes zoster keratitis or keratoconjunctivitis    Patient with shingles eruption in the C2 dermatome over the right parietal and temporal region. No dendritic lesions. However, patient had significant relief with tetracaine. No signs of uveitis or iritis. Spoke with Dr. Barnet Pall in 2 advises prednisone, Valtrex, pain medication, will see the patient tomorrow at 2 PM. He will also get erythromycin and in the eye 3 times daily. Discussed reasons to seek immediate medical care with the patient. Her safe for discharge at this time. I personally performed the services described in this documentation, which was scribed in my presence. The recorded information has been reviewed and is accurate.        Margarita Mail, PA-C 09/19/15 1920  Merrily Pew, MD 09/20/15 [phone]

## 2015-09-19 NOTE — ED Notes (Signed)
Pt c/o rash that developed on scalp and eye area this am. Blisters noted on scalp and eye area.

## 2015-09-19 NOTE — ED Notes (Signed)
Declined W/C at D/C and was escorted to lobby by RN. 

## 2015-09-19 NOTE — Discharge Instructions (Signed)
Shingles Shingles, which is also known as herpes zoster, is an infection that causes a painful skin rash and fluid-filled blisters. Shingles is not related to genital herpes, which is a sexually transmitted infection.   Shingles only develops in people who:  Have had chickenpox.  Have received the chickenpox vaccine. (This is rare.) CAUSES Shingles is caused by varicella-zoster virus (VZV). This is the same virus that causes chickenpox. After exposure to VZV, the virus stays in the body in an inactive (dormant) state. Shingles develops if the virus reactivates. This can happen many years after the initial exposure to VZV. It is not known what causes this virus to reactivate. RISK FACTORS People who have had chickenpox or received the chickenpox vaccine are at risk for shingles. Infection is more common in people who:  Are older than age 24.  Have a weakened defense (immune) system, such as those with HIV, AIDS, or cancer.  Are taking medicines that weaken the immune system, such as transplant medicines.  Are under great stress. SYMPTOMS Early symptoms of this condition include itching, tingling, and pain in an area on your skin. Pain may be described as burning, stabbing, or throbbing. A few days or weeks after symptoms start, a painful red rash appears, usually on one side of the body in a bandlike or beltlike pattern. The rash eventually turns into fluid-filled blisters that break open, scab over, and dry up in about 2-3 weeks. At any time during the infection, you may also develop:  A fever.  Chills.  A headache.  An upset stomach. DIAGNOSIS This condition is diagnosed with a skin exam. Sometimes, skin or fluid samples are taken from the blisters before a diagnosis is made. These samples are examined under a microscope or sent to a lab for testing. TREATMENT There is no specific cure for this condition. Your health care provider will probably prescribe medicines to help you  manage pain, recover more quickly, and avoid long-term problems. Medicines may include:  Antiviral drugs.  Anti-inflammatory drugs.  Pain medicines. If the area involved is on your face, you may be referred to a specialist, such as an eye doctor (ophthalmologist) or an ear, nose, and throat (ENT) doctor to help you avoid eye problems, chronic pain, or disability. HOME CARE INSTRUCTIONS Medicines  Take medicines only as directed by your health care provider.  Apply an anti-itch or numbing cream to the affected area as directed by your health care provider. Blister and Rash Care  Take a cool bath or apply cool compresses to the area of the rash or blisters as directed by your health care provider. This may help with pain and itching.  Keep your rash covered with a loose bandage (dressing). Wear loose-fitting clothing to help ease the pain of material rubbing against the rash.  Keep your rash and blisters clean with mild soap and cool water or as directed by your health care provider.  Check your rash every day for signs of infection. These include redness, swelling, and pain that lasts or increases.  Do not pick your blisters.  Do not scratch your rash. General Instructions  Rest as directed by your health care provider.  Keep all follow-up visits as directed by your health care provider. This is important.  Until your blisters scab over, your infection can cause chickenpox in people who have never had it or been vaccinated against it. To prevent this from happening, avoid contact with other people, especially:  Babies.  Pregnant women.  Children  who have eczema.  Elderly people who have transplants.  People who have chronic illnesses, such as leukemia or AIDS. SEEK MEDICAL CARE IF:  Your pain is not relieved with prescribed medicines.  Your pain does not get better after the rash heals.  Your rash looks infected. Signs of infection include redness, swelling, and pain  that lasts or increases. SEEK IMMEDIATE MEDICAL CARE IF:  The rash is on your face or nose.  You have facial pain, pain around your eye area, or loss of feeling on one side of your face.  You have ear pain or you have ringing in your ear.  You have loss of taste.  Your condition gets worse.   This information is not intended to replace advice given to you by your health care provider. Make sure you discuss any questions you have with your health care provider.   Document Released: 08/01/2005 Document Revised: 08/22/2014 Document Reviewed: 06/12/2014 Elsevier Interactive Patient Education 2016 Elsevier Inc.  Herpes Keratitis The term "keratitis" refers to an inflammation of the cornea (clear covering at the front of the eye). When you look at the color of a person's eye, you are looking through the clear cornea to the colored iris, which is inside the eye. The cornea is an extremely sensitive tissue. This is why you immediately blink when something touches your eye, or even if you think something is going to touch your eye.  One of the most common forms of keratitis is produced by the herpes virus. There are different types of herpes infections. Herpes Simplex Virus 1 (HSV-1) usually causes cold sores and eye infections. HSV-2 is usually, but not always, the cause of sexually transmitted herpes infections. There is another type of herpes virus called Herpes Zoster, which is the cause of a painful rash known as "shingles." When shingles affects the face, there may be an associated herpes infection or inflammation in the eye on the same side as the rash. This makes it very important to have your eye checked, but this is typically not a herpes keratitis infection.  Most people, at one time or another, have had some form of herpes virus in their system. Stress, fatigue, sunlight, surgery, illness, the use of certain drugs like steroids, and certain foods are all factors that may make the herpes  virus flare up again, causing a herpes-related illness. One of the places that the virus can cause inflammation is on the surface of the cornea. For this reason, if you have an active herpes infection, such as a cold sore, it is very important to avoid contact to your eyes with your fingers, since the virus may spread. SYMPTOMS  Herpes keratitis almost always occurs in only one eye at a time. Herpes keratitis causes:  Pain in the affected eye.  Extreme light sensitivity.  Eye is typically red and inflamed.  Vision may be blurred.  Eye may tear excessively. DIAGNOSIS  Herpes Keratitis has a very specific pattern of inflammation on the cornea. It is so typical that an eye specialist can tell almost immediately what is wrong, by simply looking at the eye with a special microscope and with a small amount of special green dye placed in the eye. Herpes keratitis forms a branch-like pattern of inflammation known as a "dendrite." For this reason, it is also called "dendritic keratitis." RELATED COMPLICATIONS Without treatment, herpes keratitis can cause scarring of the cornea and loss of vision. It can come back even after it has been successfully treated.  This usually happens during a generalized illness, when all herpes viruses are prone to flare up. The inflammation can also spread to the inside of the eye, causing scarring. The side effects of such scarring can result in complications and such conditions as glaucoma and cataracts. It is very important to know exactly what type of keratitis you have, and to know the cause of any red eye. This is because certain medicines, which are commonly used as eye drops, can make a herpes keratitis infection much worse very fast. Steroid drops (prednisone) for instance, can rapidly make a herpes infection worse, if used at the wrong time, while the virus is still active. TREATMENT  Medicines are available for the treatment of herpes keratitis. The medicines used  will depend on how much of the eye is involved. It may also depend on whether there is live virus still in the eye.  Your caregiver may want you to have a complete physical examination, to be sure that nothing else is going on in your body that allowed the herpes virus to flare up. Even after successful treatment, one third of people with herpes virus will have a recurrence.  HOME CARE INSTRUCTIONS   Take all medicines as instructed. Take pain medicines only as prescribed by your caregiver. Do not self medicate with eye drops, unless instructed to do so.  Keep all appointments as instructed. Remember, this illness is a leading cause of blindness. MAKE SURE YOU:   Understand these instructions.  Will watch your condition.  Will get help right away if you are not doing well or get worse.   This information is not intended to replace advice given to you by your health care provider. Make sure you discuss any questions you have with your health care provider.   Document Released: 04/26/2001 Document Revised: 10/24/2011 Document Reviewed: 01/19/2015 Elsevier Interactive Patient Education [employer].

## 2015-09-22 ENCOUNTER — Other Ambulatory Visit (HOSPITAL_COMMUNITY): Payer: Self-pay | Admitting: Interventional Radiology

## 2015-09-22 DIAGNOSIS — M542 Cervicalgia: Secondary | ICD-10-CM

## 2015-09-22 DIAGNOSIS — R202 Paresthesia of skin: Secondary | ICD-10-CM

## 2015-09-22 DIAGNOSIS — R200 Anesthesia of skin: Secondary | ICD-10-CM

## 2015-09-28 ENCOUNTER — Ambulatory Visit (HOSPITAL_COMMUNITY)
Admission: RE | Admit: 2015-09-28 | Discharge: 2015-09-28 | Disposition: A | Discharge status: Home / Self Care | Payer: Medicare Other | Source: Ambulatory Visit | Attending: Interventional Radiology | Admitting: Interventional Radiology

## 2015-09-28 DIAGNOSIS — R531 Weakness: Secondary | ICD-10-CM | POA: Insufficient documentation

## 2015-09-28 DIAGNOSIS — R200 Anesthesia of skin: Secondary | ICD-10-CM | POA: Insufficient documentation

## 2015-09-28 DIAGNOSIS — M5124 Other intervertebral disc displacement, thoracic region: Secondary | ICD-10-CM | POA: Insufficient documentation

## 2015-09-28 DIAGNOSIS — M47892 Other spondylosis, cervical region: Secondary | ICD-10-CM | POA: Diagnosis not present

## 2015-09-28 DIAGNOSIS — M79602 Pain in left arm: Secondary | ICD-10-CM | POA: Diagnosis not present

## 2015-09-28 DIAGNOSIS — M542 Cervicalgia: Secondary | ICD-10-CM | POA: Diagnosis not present

## 2015-09-28 DIAGNOSIS — R202 Paresthesia of skin: Secondary | ICD-10-CM

## 2015-09-30 ENCOUNTER — Telehealth (HOSPITAL_COMMUNITY): Payer: Self-pay | Admitting: Interventional Radiology

## 2015-09-30 NOTE — Telephone Encounter (Signed)
Called pt, left VM for him to call me back. JM

## 2015-10-01 ENCOUNTER — Other Ambulatory Visit (HOSPITAL_COMMUNITY): Payer: Self-pay | Admitting: Interventional Radiology

## 2015-10-01 DIAGNOSIS — I671 Cerebral aneurysm, nonruptured: Secondary | ICD-10-CM

## 2015-10-12 ENCOUNTER — Other Ambulatory Visit: Payer: Self-pay | Admitting: Radiology

## 2015-10-14 ENCOUNTER — Encounter (HOSPITAL_COMMUNITY)
Admission: RE | Admit: 2015-10-14 | Discharge: 2015-10-14 | Disposition: A | Discharge status: Home / Self Care | Payer: Medicare Other | Source: Ambulatory Visit | Attending: Interventional Radiology | Admitting: Interventional Radiology

## 2015-10-14 ENCOUNTER — Encounter (HOSPITAL_COMMUNITY): Payer: Self-pay

## 2015-10-14 DIAGNOSIS — Z7982 Long term (current) use of aspirin: Secondary | ICD-10-CM | POA: Insufficient documentation

## 2015-10-14 DIAGNOSIS — I10 Essential (primary) hypertension: Secondary | ICD-10-CM | POA: Diagnosis not present

## 2015-10-14 DIAGNOSIS — Z87891 Personal history of nicotine dependence: Secondary | ICD-10-CM | POA: Insufficient documentation

## 2015-10-14 DIAGNOSIS — Z01812 Encounter for preprocedural laboratory examination: Secondary | ICD-10-CM | POA: Diagnosis not present

## 2015-10-14 DIAGNOSIS — Z01818 Encounter for other preprocedural examination: Secondary | ICD-10-CM | POA: Diagnosis not present

## 2015-10-14 DIAGNOSIS — I671 Cerebral aneurysm, nonruptured: Secondary | ICD-10-CM | POA: Diagnosis not present

## 2015-10-14 DIAGNOSIS — E039 Hypothyroidism, unspecified: Secondary | ICD-10-CM | POA: Diagnosis not present

## 2015-10-14 DIAGNOSIS — Z79899 Other long term (current) drug therapy: Secondary | ICD-10-CM | POA: Insufficient documentation

## 2015-10-14 HISTORY — DX: Pneumonia, unspecified organism: J18.9

## 2015-10-14 HISTORY — DX: Overactive bladder: N32.81

## 2015-10-14 HISTORY — DX: Other specified postprocedural states: Z98.890

## 2015-10-14 HISTORY — DX: Hypothyroidism, unspecified: E03.9

## 2015-10-14 HISTORY — DX: Personal history of other diseases of the digestive system: Z87.19

## 2015-10-14 HISTORY — DX: Anxiety disorder, unspecified: F41.9

## 2015-10-14 LAB — CBC WITH DIFFERENTIAL/PLATELET
BASOS ABS: 0 10*3/uL (ref 0.0–0.1)
Basophils Relative: 0 %
EOS ABS: 0.2 10*3/uL (ref 0.0–0.7)
EOS PCT: 5 %
HCT: 41.2 % (ref 39.0–52.0)
Hemoglobin: 14 g/dL (ref 13.0–17.0)
Lymphocytes Relative: 32 %
Lymphs Abs: 1.5 10*3/uL (ref 0.7–4.0)
MCH: 28.5 pg (ref 26.0–34.0)
MCHC: 34 g/dL (ref 30.0–36.0)
MCV: 83.9 fL (ref 78.0–100.0)
Monocytes Absolute: 0.5 10*3/uL (ref 0.1–1.0)
Monocytes Relative: 10 %
Neutro Abs: 2.4 10*3/uL (ref 1.7–7.7)
Neutrophils Relative %: 53 %
PLATELETS: 146 10*3/uL — AB (ref 150–400)
RBC: 4.91 MIL/uL (ref 4.22–5.81)
RDW: 14.4 % (ref 11.5–15.5)
WBC: 4.6 10*3/uL (ref 4.0–10.5)

## 2015-10-14 LAB — COMPREHENSIVE METABOLIC PANEL
ALT: 17 U/L (ref 17–63)
ANION GAP: 8 (ref 5–15)
AST: 27 U/L (ref 15–41)
Albumin: 3.7 g/dL (ref 3.5–5.0)
Alkaline Phosphatase: 58 U/L (ref 38–126)
BILIRUBIN TOTAL: 0.8 mg/dL (ref 0.3–1.2)
BUN: 18 mg/dL (ref 6–20)
CALCIUM: 9.6 mg/dL (ref 8.9–10.3)
CO2: 28 mmol/L (ref 22–32)
Chloride: 104 mmol/L (ref 101–111)
Creatinine, Ser: 1.18 mg/dL (ref 0.61–1.24)
GFR, EST NON AFRICAN AMERICAN: 59 mL/min — AB (ref >60)
Glucose, Bld: 92 mg/dL (ref 65–99)
POTASSIUM: 4.1 mmol/L (ref 3.5–5.1)
SODIUM: 140 mmol/L (ref 135–145)
TOTAL PROTEIN: 6.9 g/dL (ref 6.5–8.1)

## 2015-10-14 LAB — PROTIME-INR
INR: 1.11 (ref 0.00–1.49)
PROTHROMBIN TIME: 14.5 s (ref 11.6–15.2)

## 2015-10-14 LAB — APTT: APTT: 27 s (ref 24–37)

## 2015-10-14 NOTE — Pre-Procedure Instructions (Signed)
Angel Keller  10/14/2015      [facility] [address], [place] [address] Phone: [phone] Fax: [phone]    Your procedure is scheduled on Wednesday October 21 2015.  Report to [facility] Admitting at 700 A.M.  Call this number if you have problems the morning of surgery:  [phone]   Remember:  Do not eat food or drink liquids after midnight Tuesday March 7th .  Take these medicines the morning of surgery with A SIP OF WATER citalopram (celexa), levothyroxine (synthroid), Plavix, aspirin.   Start Plavix on the 3rd per Dr. Arlean Hopping office, continue aspirin.  STOP: ALL Vitamins, Supplements, Effient and Herbal Medications, Fish Oils, NSAIDs (Nonsteroidal Anti-inflammatories such as Ibuprofen, Aleve, or Advil), and Goody's/BC Powders 7 days prior to surgery, until after surgery as directed by your physician.     Do not wear jewelry, make-up or nail polish.  Do not wear lotions, powders, or perfumes.  You may wear deodorant.  Do not shave 48 hours prior to surgery.  Men may shave face and neck.  Do not bring valuables to the hospital.  [facility] is not responsible for any belongings or valuables.  Contacts, dentures or bridgework may not be worn into surgery.  Leave your suitcase in the car.  After surgery it may be brought to your room.  For patients admitted to the hospital, discharge time will be determined by your treatment team.  Patients discharged the day of surgery will not be allowed to drive home.        Preparing for Surgery at [facility]  Before surgery, you can play an important role.  Because skin is not sterile, your skin needs to be as free of germs as possible.  You can reduce the number of germs on your skin by washing with CHG (chlorahexidine gluconate) Soap before surgery.  CHG is an antiseptic cleaner with kills germs and bonds with the skin to continue  killing germs even after washing.   Please do not use if you have an allergy to CHG or antibacterial soaps.  If your skin becomes reddened/irritated stop using the CHG.  Do not shave (including legs and underarms) for at least 48 hours prior to first CHG shower.  It is okay to shave your face.  Please follow these instructions carefully:  1. Shower with CHG Soap the night before surgery and the morning of Surgery. 2. If you choose to wash your hair, wash your hair first as usual with your normal shampoo. 3. After you shampoo, rinse your hair and body thoroughly to remove the Shampoo. 4. Use CHG as you would any other liquid soap. You can apply chg directly to the skin and wash gently with scrungie or a clean washcloth. 5. Apply the CHG Soap to your body ONLY FROM THE NECK DOWN. Do not use on open wounds or open sores. Avoid contact with your eyes, ears, mouth and genitals (private parts). Wash genitals (private parts) with your normal soap. 6. Wash thoroughly, paying special attention to the area where your surgery will be performed. 7. Thoroughly rinse your body with warm water from the neck down. 8. DO NOT shower/wash with your normal soap after using and rinsing off the CHG Soap. 9. Pat yourself dry with a clean towel.  10. Wear clean pajamas.  11. Place clean sheets on your bed the night of your first shower  and do not sleep with pets.  Day of Surgery  Do not apply any lotions/deodorants the morning of surgery. Please wear clean clothes to the hospital/surgery center.   Please read over the following fact sheets that you were given. Pain Booklet, Coughing and Deep Breathing, MRSA Information and Surgical Site Infection Prevention

## 2015-10-16 NOTE — Progress Notes (Signed)
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 Estanislado Pandy.   PCP is Dr. Cathlean Cower.   PMH includes:  HTN, cerebral aneurysm, hypothyroidism. Former smoker. BMI 24.   Medications include: ASA, bisoprolol-hctz, levothyroxine, lovastatin  Preoperative labs reviewed.    EKG 10/14/15: sinus bradycardia (47 bpm)  Nuclear stress test 09/14/15: Myocardial perfusion imaging normal. LV systolic function normal without regional wall motion abnormalities. EF 62%  If no changes, I anticipate pt can proceed with surgery as scheduled.   Willeen Cass, FNP-BC [facility]/Anesthesiology Phone: [phone] 10/16/2015 12:28 PM

## 2015-10-20 ENCOUNTER — Other Ambulatory Visit: Payer: Self-pay | Admitting: General Surgery

## 2015-10-21 ENCOUNTER — Encounter (HOSPITAL_COMMUNITY): Payer: Self-pay | Admitting: Certified Registered Nurse Anesthetist

## 2015-10-21 ENCOUNTER — Encounter (HOSPITAL_COMMUNITY)
Admission: AD | Disposition: A | Discharge status: Home / Self Care | Payer: Self-pay | Source: Ambulatory Visit | Attending: Interventional Radiology

## 2015-10-21 ENCOUNTER — Ambulatory Visit (HOSPITAL_COMMUNITY): Payer: Medicare Other | Admitting: Emergency Medicine

## 2015-10-21 ENCOUNTER — Ambulatory Visit (HOSPITAL_COMMUNITY)
Admission: RE | Admit: 2015-10-21 | Discharge: 2015-10-21 | Disposition: A | Discharge status: Home / Self Care | Payer: Medicare Other | Source: Ambulatory Visit | Attending: Interventional Radiology | Admitting: Interventional Radiology

## 2015-10-21 ENCOUNTER — Inpatient Hospital Stay (HOSPITAL_COMMUNITY)
Admission: AD | Admit: 2015-10-21 | Discharge: 2015-10-22 | DRG: 027 | Disposition: A | Discharge status: Home / Self Care | Payer: Medicare Other | Source: Ambulatory Visit | Attending: Interventional Radiology | Admitting: Interventional Radiology

## 2015-10-21 ENCOUNTER — Ambulatory Visit (HOSPITAL_COMMUNITY): Payer: Medicare Other | Admitting: Certified Registered Nurse Anesthetist

## 2015-10-21 DIAGNOSIS — Z7982 Long term (current) use of aspirin: Secondary | ICD-10-CM

## 2015-10-21 DIAGNOSIS — N3281 Overactive bladder: Secondary | ICD-10-CM | POA: Diagnosis present

## 2015-10-21 DIAGNOSIS — I10 Essential (primary) hypertension: Secondary | ICD-10-CM | POA: Diagnosis present

## 2015-10-21 DIAGNOSIS — Z7902 Long term (current) use of antithrombotics/antiplatelets: Secondary | ICD-10-CM

## 2015-10-21 DIAGNOSIS — I671 Cerebral aneurysm, nonruptured: Secondary | ICD-10-CM

## 2015-10-21 DIAGNOSIS — Z87891 Personal history of nicotine dependence: Secondary | ICD-10-CM

## 2015-10-21 DIAGNOSIS — E039 Hypothyroidism, unspecified: Secondary | ICD-10-CM | POA: Diagnosis present

## 2015-10-21 DIAGNOSIS — Z79899 Other long term (current) drug therapy: Secondary | ICD-10-CM

## 2015-10-21 DIAGNOSIS — F419 Anxiety disorder, unspecified: Secondary | ICD-10-CM | POA: Diagnosis present

## 2015-10-21 HISTORY — PX: RADIOLOGY WITH ANESTHESIA: SHX6223

## 2015-10-21 LAB — MRSA PCR SCREENING: MRSA by PCR: NEGATIVE

## 2015-10-21 LAB — PLATELET INHIBITION P2Y12: Platelet Function  P2Y12: 211 [PRU] (ref 194–418)

## 2015-10-21 LAB — POCT ACTIVATED CLOTTING TIME
ACTIVATED CLOTTING TIME: 193 s
Activated Clotting Time: 183 seconds

## 2015-10-21 LAB — HEPARIN LEVEL (UNFRACTIONATED): Heparin Unfractionated: 0.16 IU/mL — ABNORMAL LOW (ref 0.30–0.70)

## 2015-10-21 SURGERY — RADIOLOGY WITH ANESTHESIA
Anesthesia: Monitor Anesthesia Care

## 2015-10-21 MED ORDER — LIDOCAINE HCL (PF) 1 % IJ SOLN: 20.0000 mL | Freq: Once | INTRAMUSCULAR | Status: DC

## 2015-10-21 MED ORDER — HEPARIN SODIUM (PORCINE) 1000 UNIT/ML IJ SOLN
INTRAMUSCULAR | Status: DC | PRN
  Administered 2015-10-21: 500 [IU] via INTRAVENOUS
  Administered 2015-10-21: 3000 [IU] via INTRAVENOUS
  Administered 2015-10-21: 1000 [IU] via INTRAVENOUS
  Administered 2015-10-21: 500 [IU] via INTRAVENOUS

## 2015-10-21 MED ORDER — PROPOFOL 500 MG/50ML IV EMUL
INTRAVENOUS | Status: DC | PRN
  Administered 2015-10-21: 50 ug/kg/min via INTRAVENOUS

## 2015-10-21 MED ORDER — GLYCOPYRROLATE 0.2 MG/ML IJ SOLN
INTRAMUSCULAR | Status: DC | PRN
  Administered 2015-10-21: .6 mg via INTRAVENOUS

## 2015-10-21 MED ORDER — FENTANYL CITRATE (PF) 100 MCG/2ML IJ SOLN: 25.0000 ug | INTRAMUSCULAR | Status: DC | PRN

## 2015-10-21 MED ORDER — CEFAZOLIN SODIUM-DEXTROSE 2-3 GM-% IV SOLR
2.0000 g | INTRAVENOUS | Status: AC
  Administered 2015-10-21: 2 g via INTRAVENOUS
  Filled 2015-10-21: qty 50

## 2015-10-21 MED ORDER — ACETAMINOPHEN 500 MG PO TABS
1000.0000 mg | ORAL_TABLET | Freq: Four times a day (QID) | ORAL | Status: DC | PRN
  Administered 2015-10-21 – 2015-10-22 (×2): 1000 mg via ORAL
  Filled 2015-10-21 (×2): qty 2

## 2015-10-21 MED ORDER — LACTATED RINGERS IV SOLN
INTRAVENOUS | Status: DC | PRN
  Administered 2015-10-21 (×3): via INTRAVENOUS

## 2015-10-21 MED ORDER — PHENYLEPHRINE HCL 10 MG/ML IJ SOLN
INTRAMUSCULAR | Status: DC | PRN
  Administered 2015-10-21 (×5): 80 ug via INTRAVENOUS

## 2015-10-21 MED ORDER — CLOPIDOGREL BISULFATE 75 MG PO TABS
75.0000 mg | ORAL_TABLET | ORAL | Status: AC
  Administered 2015-10-21: 75 mg via ORAL
  Filled 2015-10-21: qty 1

## 2015-10-21 MED ORDER — SODIUM CHLORIDE 0.9 % IV SOLN
INTRAVENOUS | Status: DC
  Administered 2015-10-21 – 2015-10-22 (×3): via INTRAVENOUS

## 2015-10-21 MED ORDER — ACETAMINOPHEN 160 MG/5ML PO SOLN
325.0000 mg | ORAL | Status: DC | PRN
  Filled 2015-10-21: qty 20.3

## 2015-10-21 MED ORDER — IOHEXOL 300 MG/ML  SOLN
300.0000 mL | Freq: Once | INTRAMUSCULAR | Status: AC | PRN
  Administered 2015-10-21: 120 mL via INTRA_ARTERIAL

## 2015-10-21 MED ORDER — SODIUM CHLORIDE 0.9 % IV SOLN: Freq: Once | INTRAVENOUS | Status: DC

## 2015-10-21 MED ORDER — CLOPIDOGREL BISULFATE 75 MG PO TABS
ORAL_TABLET | ORAL | Status: AC
  Filled 2015-10-21: qty 1

## 2015-10-21 MED ORDER — NITROGLYCERIN 1 MG/10 ML FOR IR/CATH LAB
INTRA_ARTERIAL | Status: AC
  Filled 2015-10-21: qty 10

## 2015-10-21 MED ORDER — ONDANSETRON HCL 4 MG/2ML IJ SOLN
INTRAMUSCULAR | Status: DC | PRN
  Administered 2015-10-21: 4 mg via INTRAVENOUS

## 2015-10-21 MED ORDER — CLOPIDOGREL BISULFATE 75 MG PO TABS
75.0000 mg | ORAL_TABLET | Freq: Every day | ORAL | Status: DC
  Administered 2015-10-22: 75 mg via ORAL
  Filled 2015-10-21: qty 1

## 2015-10-21 MED ORDER — LIDOCAINE HCL 1 % IJ SOLN
INTRAMUSCULAR | Status: AC
  Filled 2015-10-21: qty 20

## 2015-10-21 MED ORDER — VECURONIUM BROMIDE 10 MG IV SOLR
INTRAVENOUS | Status: DC | PRN
  Administered 2015-10-21: 7 mg via INTRAVENOUS
  Administered 2015-10-21: 2 mg via INTRAVENOUS

## 2015-10-21 MED ORDER — FENTANYL CITRATE (PF) 100 MCG/2ML IJ SOLN
INTRAMUSCULAR | Status: DC | PRN
  Administered 2015-10-21 (×2): 50 ug via INTRAVENOUS

## 2015-10-21 MED ORDER — LIDOCAINE HCL (CARDIAC) 20 MG/ML IV SOLN
INTRAVENOUS | Status: DC | PRN
  Administered 2015-10-21: 40 mg via INTRAVENOUS

## 2015-10-21 MED ORDER — NICARDIPINE HCL IN NACL 20-0.86 MG/200ML-% IV SOLN
INTRAVENOUS | Status: DC | PRN
  Administered 2015-10-21: 5 mg/h via INTRAVENOUS

## 2015-10-21 MED ORDER — HEPARIN (PORCINE) IN NACL 100-0.45 UNIT/ML-% IJ SOLN
INTRAMUSCULAR | Status: AC
  Filled 2015-10-21: qty 250

## 2015-10-21 MED ORDER — ACETAMINOPHEN 325 MG PO TABS: 325.0000 mg | ORAL_TABLET | ORAL | Status: DC | PRN

## 2015-10-21 MED ORDER — PHENYLEPHRINE HCL 10 MG/ML IJ SOLN
10.0000 mg | INTRAMUSCULAR | Status: DC | PRN
  Administered 2015-10-21: 50 ug/min via INTRAVENOUS

## 2015-10-21 MED ORDER — EPHEDRINE SULFATE 50 MG/ML IJ SOLN
INTRAMUSCULAR | Status: DC | PRN
  Administered 2015-10-21 (×2): 10 mg via INTRAVENOUS
  Administered 2015-10-21: 5 mg via INTRAVENOUS

## 2015-10-21 MED ORDER — NICARDIPINE HCL IN NACL 20-0.86 MG/200ML-% IV SOLN
5.0000 mg/h | INTRAVENOUS | Status: DC
  Filled 2015-10-21: qty 200

## 2015-10-21 MED ORDER — ONDANSETRON HCL 4 MG/2ML IJ SOLN: 4.0000 mg | Freq: Four times a day (QID) | INTRAMUSCULAR | Status: DC | PRN

## 2015-10-21 MED ORDER — LIDOCAINE HCL (PF) 1 % IJ SOLN
INTRAMUSCULAR | Status: DC | PRN
  Administered 2015-10-21: 8 mL

## 2015-10-21 MED ORDER — ACETAMINOPHEN 650 MG RE SUPP: 650.0000 mg | Freq: Four times a day (QID) | RECTAL | Status: DC | PRN

## 2015-10-21 MED ORDER — NEOSTIGMINE METHYLSULFATE 10 MG/10ML IV SOLN
INTRAVENOUS | Status: DC | PRN
  Administered 2015-10-21: 4 mg via INTRAVENOUS

## 2015-10-21 MED ORDER — OXYCODONE HCL 5 MG/5ML PO SOLN: 5.0000 mg | Freq: Once | ORAL | Status: DC | PRN

## 2015-10-21 MED ORDER — PROPOFOL 10 MG/ML IV BOLUS
INTRAVENOUS | Status: DC | PRN
  Administered 2015-10-21: 70 mg via INTRAVENOUS

## 2015-10-21 MED ORDER — HEPARIN (PORCINE) IN NACL 100-0.45 UNIT/ML-% IJ SOLN
500.0000 [IU]/h | INTRAMUSCULAR | Status: AC
  Administered 2015-10-21: 500 [IU]/h via INTRAVENOUS
  Filled 2015-10-21: qty 250

## 2015-10-21 MED ORDER — ASPIRIN 325 MG PO TABS
325.0000 mg | ORAL_TABLET | Freq: Every day | ORAL | Status: DC
  Administered 2015-10-22: 325 mg via ORAL
  Filled 2015-10-21: qty 1

## 2015-10-21 MED ORDER — OXYCODONE HCL 5 MG PO TABS: 5.0000 mg | ORAL_TABLET | Freq: Once | ORAL | Status: DC | PRN

## 2015-10-21 MED ORDER — LABETALOL HCL 5 MG/ML IV SOLN
INTRAVENOUS | Status: DC | PRN
  Administered 2015-10-21: 10 mg via INTRAVENOUS

## 2015-10-21 MED ORDER — MIDAZOLAM HCL 5 MG/5ML IJ SOLN
INTRAMUSCULAR | Status: DC | PRN
  Administered 2015-10-21: 2 mg via INTRAVENOUS

## 2015-10-21 NOTE — Progress Notes (Signed)
6 [place] Exoseal closure device deployed Right groin by [employer], RT

## 2015-10-21 NOTE — Progress Notes (Signed)
[place] for heparin Indication: s/p cerebral angiogram  No Known Allergies  Patient Measurements: Height: 5\' 10"  (177.8 cm) Weight: 165 lb 12.6 oz (75.2 kg) IBW/kg (Calculated) : 73 Heparin Dosing Weight: 73kg  Vital Signs: Temp: 98.2 F (36.8 C) (03/08 2000) Temp Source: Oral (03/08 2000) BP: 124/77 mmHg (03/08 1900) Pulse Rate: 68 (03/08 1900)  Labs:  Recent Labs  10/21/15 1959  HEPARINUNFRC 0.16*    Estimated Creatinine Clearance: 55.8 mL/min (by C-G formula based on Cr of 1.18).   Assessment: 2 YOM s/p cerebral angiogram to be started on heparin. Started in neuro PACU at 500 units/hr.  No excessive bleeding noted from procedure.  Initial HL is at goal at 0.16 on heparin 500 units/hr. No issues with infusion or bleeding noted.   Goal of Therapy:  Heparin level 0.1-0.25 units/ml Monitor platelets by anticoagulation protocol: Yes   Plan:  Continue heparin 500 units/hr No daily heparin level as infusion to be stopped at 0700 on 3/9 in anticipation of sheath removal  Andrey Cota. Diona Foley, PharmD, [place] Clinical Pharmacist Pager [phone] 10/21/2015 8:26 PM

## 2015-10-21 NOTE — Progress Notes (Signed)
Anesthesia present for case 

## 2015-10-21 NOTE — Procedures (Signed)
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

## 2015-10-21 NOTE — Sedation Documentation (Signed)
6 [place] Exoseal closure device right groin by Marilynn Latino, RT

## 2015-10-21 NOTE — Anesthesia Preprocedure Evaluation (Addendum)
Anesthesia Evaluation  Patient identified by MRN, date of birth, ID band Patient awake    Reviewed: Allergy & Precautions, NPO status , Patient's Chart, lab work & pertinent test results, reviewed documented beta blocker date and time   Airway Mallampati: I  TM Distance: >3 FB Neck ROM: Full    Dental  (+) Partial Lower, Upper Dentures   Pulmonary neg shortness of breath, neg sleep apnea, neg COPD, neg recent URI, former smoker, neg PE   breath sounds clear to auscultation       Cardiovascular hypertension, Pt. on medications and Pt. on home beta blockers  Rhythm:Regular     Neuro/Psych PSYCHIATRIC DISORDERS Anxiety Depression Brain aneurysm with headaches  Neuromuscular disease    GI/Hepatic Neg liver ROS, GERD  Controlled,  Endo/Other  Hypothyroidism   Renal/GU negative Renal ROS     Musculoskeletal negative musculoskeletal ROS (+)   Abdominal   Peds  Hematology negative hematology ROS (+)   Anesthesia Other Findings   Reproductive/Obstetrics                            Anesthesia Physical Anesthesia Plan  ASA: III  Anesthesia Plan: MAC and General   Post-op Pain Management:    Induction: Intravenous  Airway Management Planned: Oral ETT, Natural Airway, Nasal Cannula and Simple Face Mask  Additional Equipment: Arterial line  Intra-op Plan:   Post-operative Plan: Extubation in OR  Informed Consent: I have reviewed the patients History and Physical, chart, labs and discussed the procedure including the risks, benefits and alternatives for the proposed anesthesia with the patient or authorized representative who has indicated his/her understanding and acceptance.   Dental advisory given  Plan Discussed with: CRNA and Surgeon  Anesthesia Plan Comments:         Anesthesia Quick Evaluation

## 2015-10-21 NOTE — Anesthesia Procedure Notes (Signed)
Procedure Name: Intubation Date/Time: 10/21/2015 11:14 AM Performed by: [place] Pink Pre-anesthesia Checklist: Patient identified, Emergency Drugs available, Suction available, Patient being monitored and Timeout performed Patient Re-evaluated:Patient Re-evaluated prior to inductionOxygen Delivery Method: Circle system utilized Preoxygenation: Pre-oxygenation with 100% oxygen Intubation Type: IV induction Ventilation: Oral airway inserted - appropriate to patient size and Two handed mask ventilation required Laryngoscope Size: McGraph and 3 Grade View: Grade I Tube type: Oral Tube size: 8.0 mm Number of attempts: 1 Airway Equipment and Method: Video-laryngoscopy Placement Confirmation: ETT inserted through vocal cords under direct vision,  positive ETCO2 and breath sounds checked- equal and bilateral Secured at: 24 cm Tube secured with: Tape Dental Injury: Teeth and Oropharynx as per pre-operative assessment

## 2015-10-21 NOTE — Anesthesia Postprocedure Evaluation (Signed)
Anesthesia Post Note  Patient: Angel Keller  Procedure(s) Performed: Procedure(s) (LRB): RADIOLOGY WITH ANESTHESIA (N/A)  Patient location during evaluation: PACU Anesthesia Type: General Level of consciousness: awake Pain management: pain level controlled Vital Signs Assessment: post-procedure vital signs reviewed and stable Respiratory status: spontaneous breathing Cardiovascular status: stable Postop Assessment: no signs of nausea or vomiting Anesthetic complications: no    Last Vitals:  Filed Vitals:   10/21/15 1500 10/21/15 1600  BP: 124/70 127/66  Pulse: 71 69  Temp:  36.7 C  Resp: 24 19    Last Pain: There were no vitals filed for this visit.               Allix Blomquist

## 2015-10-21 NOTE — Progress Notes (Signed)
Due to pulse of 54, this nurse asked Claiborne Billings, [place] with Dr. Estanislado Pandy about what dosage of Nimotop to give. Claiborne Billings, [place], paged Dr. Estanislado Pandy, awaiting call back.

## 2015-10-21 NOTE — Transfer of Care (Signed)
Immediate Anesthesia Transfer of Care Note  Patient: Angel Keller  Procedure(s) Performed: Procedure(s): RADIOLOGY WITH ANESTHESIA (N/A)  Patient Location: PACU  Anesthesia Type:General  Level of Consciousness: awake, alert , oriented and patient cooperative  Airway & Oxygen Therapy: Patient Spontanous Breathing and Patient connected to nasal cannula oxygen  Post-op Assessment: Report given to RN, Post -op Vital signs reviewed and stable, Patient moving all extremities X 4 and Patient able to stick tongue midline  Post vital signs: Reviewed and stable  Last Vitals:  Filed Vitals:   10/21/15 1334 10/21/15 1345  BP:    Pulse: 72 72  Temp: 36.3 C   Resp: 20 20    Complications: No apparent anesthesia complications

## 2015-10-21 NOTE — Progress Notes (Signed)
Per Claiborne Billings, [place] whom spoke with Dr. Estanislado Pandy, do not give any Nimotop preop.

## 2015-10-21 NOTE — Progress Notes (Signed)
Pt took 325 mg aspirin and 75 mg Plavix orally today at 0515.

## 2015-10-21 NOTE — H&P (Signed)
Chief Complaint: headaches, 71mm middle cerebral artery aneurysm.   Referring Physician: Dr. Tamsen Roers  Supervising Physician: Dr. Luanne Bras  HPI: Angel Keller is an 75 y.o. 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  He complains of pain in his posterior cervical spine as well as his posterior head after weight lifting.  He was evaluated with an MRI and the above findings were noted.  Dr. Estanislado Pandy sent him for a cervical MRI as well due to the location of pain.  This revealed multi-level neural impingement.  A discussion was had as to whether this small aneurysm was the source of his symptoms vs his neck.  The patient wanted to go ahead and proceed with intervention.  He has been taking his plavix and is present today for his procedure.  Past Medical History:  Past Medical History  Diagnosis Date  . Hypertension   . Thyroid disease   . Shingles   . Aneurysm ([place])   . Hypothyroidism   . Pneumonia   . Anxiety   . Overactive bladder   . Hx of inguinal hernia repair     Past Surgical History:  Past Surgical History  Procedure Laterality Date  . Hernia repair    . Rotator cuff repair Bilateral   . Elbow surgery Bilateral   . Varicose vein surgery    . Bilateral carpal tunnel release    . Eye surgery Bilateral     cataracts    Family History: History reviewed. No pertinent family history.  Social History:  reports that he quit smoking about 47 years ago. He has never used smokeless tobacco. He reports that he does not drink alcohol or use illicit drugs.  Allergies: No Known Allergies  Medications:   Medication List    ASK your doctor about these medications        aspirin EC 325 MG tablet  Take 325 mg by mouth daily.     B-12 PO  Take 1 tablet by mouth daily.     bisoprolol-hydrochlorothiazide 2.5-6.25 MG tablet  Commonly known as:  ZIAC  Take 1 tablet by mouth daily.     citalopram 20 MG tablet  Commonly known as:   CELEXA  Take 20 mg by mouth daily.     clopidogrel 75 MG tablet  Commonly known as:  PLAVIX  Take 75 mg by mouth daily.     levothyroxine 150 MCG tablet  Commonly known as:  SYNTHROID, LEVOTHROID  Take 150 mcg by mouth daily.     lovastatin 20 MG tablet  Commonly known as:  MEVACOR  Take 20 mg by mouth daily.        Please HPI for pertinent positives, otherwise complete 10 system ROS negative.  Mallampati Score: MD Evaluation Airway: WNL Heart: WNL Abdomen: WNL Chest/ Lungs: WNL ASA  Classification: 3 Mallampati/Airway Score: One  Physical Exam: BP 155/82 mmHg  Pulse 54  Temp(Src) 98.5 F (36.9 C) (Oral)  Resp 18  Ht 5\' 10"  (1.778 m)  Wt 163 lb (73.936 kg)  BMI 23.39 kg/m2  SpO2 96% Body mass index is 23.39 kg/(m^2). General: pleasant, WD, WN white male who is laying in bed in NAD HEENT: head is normocephalic, atraumatic.  Sclera are noninjected.  PERRL.  Ears and nose without any masses or lesions.  Mouth is pink but dry Heart: regular rhythm, slightly brady.  Normal s1,s2. No obvious murmurs, gallops, or rubs noted.  Palpable radial and pedal  pulses bilaterally Lungs: CTAB, no wheezes, rhonchi, or rales noted.  Respiratory effort nonlabored Abd: soft, NT, ND, +BS, no masses, hernias, or organomegaly MS: all 4 extremities are symmetrical with no cyanosis, clubbing, or edema. Skin: warm and dry with no masses, lesions, or rashes Psych: A&Ox3 with an appropriate affect.   Labs: No results found for this or any previous visit (from the past 48 hour(s)).  Imaging: No results found.  Assessment/Plan 1. 60mm right middle cerebral artery aneurysm -awaiting P2Y12 lab.  If this is in appropriate range, then will proceed with case today. -other labs have been reviewed from last week and are normal.  -the patient is NPO. - the procedure including risks and complications have been discussed with the patient by Dr. Estanislado Pandy.  The patient understands and is agreeable to  proceed.  Thank you for this interesting consult.  I greatly enjoyed meeting Angel Keller and look forward to participating in their care.  A copy of this report was sent to the requesting provider on this date.  Electronically Signed: Henreitta Cea 10/21/2015, 8:23 AM   I spent a total of 40 Minutes  in face to face in clinical consultation, greater than 50% of which was counseling/coordinating care for 62mm right middle cerebral artery aneurysm.

## 2015-10-21 NOTE — Progress Notes (Signed)
ANTICOAGULATION CONSULT NOTE - Initial Consult  Pharmacy Consult for heparin Indication: s/p cerebral angiogram  No Known Allergies  Patient Measurements: Height: 5\' 10"  (177.8 cm) Weight: 163 lb (73.936 kg) IBW/kg (Calculated) : 73 Heparin Dosing Weight: 73kg  Vital Signs: Temp: 97.3 F (36.3 C) (03/08 1334) Temp Source: Oral (03/08 0747) BP: 155/82 mmHg (03/08 0747) Pulse Rate: 72 (03/08 1345)  Labs: No results for input(s): HGB, HCT, PLT, APTT, LABPROT, INR, HEPARINUNFRC, HEPRLOWMOCWT, CREATININE, CKTOTAL, CKMB, TROPONINI in the last 72 hours.  Estimated Creatinine Clearance: 55.8 mL/min (by C-G formula based on Cr of 1.18).   Assessment: 89 YOM s/p cerebral angiogram to be started on heparin. Started in neuro PACU at 500 units/hr.  No excessive bleeding noted from procedure. No labs today.  Goal of Therapy:  Heparin level 0.1-0.25 units/ml Monitor platelets by anticoagulation protocol: Yes   Plan:  -continue heparin at 500 units/hr -check HL at 2000 tonight -no daily heparin level as infusion to be stopped at 0700 on 3/9 in anticipation of sheath removal  Rober Skeels D. Sylvester Minton, PharmD, BCPS Clinical Pharmacist Pager: [phone] 10/21/2015 2:07 PM

## 2015-10-21 NOTE — Progress Notes (Signed)
Same day post-procedure eval  S/p LVIS jr flow diverting stent of (R)MCA aneurysm Resting well in Neuro ICU Offers no c/o. Denies HA, N/V BP stable, no need for Cardene  BP 124/70 mmHg  Pulse 71  Temp(Src) 98.1 F (36.7 C) (Oral)  Resp 24  Ht 5\' 10"  (1.778 m)  Wt 165 lb 12.6 oz (75.2 kg)  BMI 23.79 kg/m2  SpO2 96% Gen: NAD Neuro: PEERLA, EOMI No drift, fine motor intact, no deficit Tongue midline, Face symmetric Strength 5/5 UE and LE  Lungs: CTA Heart: Regular  Groin: soft, NT, no hematoma Feet warm  S/p LVIS jr flow diverting stent of (R)MCA aneurysm  Anticipate DC in am   Ascencion Dike PA-C Interventional Radiology 10/21/2015 3:49 PM

## 2015-10-22 ENCOUNTER — Encounter (HOSPITAL_COMMUNITY): Payer: Self-pay | Admitting: Interventional Radiology

## 2015-10-22 ENCOUNTER — Other Ambulatory Visit: Payer: Self-pay | Admitting: Radiology

## 2015-10-22 DIAGNOSIS — I671 Cerebral aneurysm, nonruptured: Secondary | ICD-10-CM | POA: Diagnosis present

## 2015-10-22 DIAGNOSIS — I10 Essential (primary) hypertension: Secondary | ICD-10-CM | POA: Diagnosis present

## 2015-10-22 DIAGNOSIS — Z87891 Personal history of nicotine dependence: Secondary | ICD-10-CM | POA: Diagnosis not present

## 2015-10-22 DIAGNOSIS — Z79899 Other long term (current) drug therapy: Secondary | ICD-10-CM | POA: Diagnosis not present

## 2015-10-22 DIAGNOSIS — Z7902 Long term (current) use of antithrombotics/antiplatelets: Secondary | ICD-10-CM | POA: Diagnosis not present

## 2015-10-22 DIAGNOSIS — E039 Hypothyroidism, unspecified: Secondary | ICD-10-CM | POA: Diagnosis present

## 2015-10-22 DIAGNOSIS — N3281 Overactive bladder: Secondary | ICD-10-CM | POA: Diagnosis present

## 2015-10-22 DIAGNOSIS — F419 Anxiety disorder, unspecified: Secondary | ICD-10-CM | POA: Diagnosis present

## 2015-10-22 DIAGNOSIS — Z7982 Long term (current) use of aspirin: Secondary | ICD-10-CM | POA: Diagnosis not present

## 2015-10-22 LAB — CBC WITH DIFFERENTIAL/PLATELET
Basophils Absolute: 0 10*3/uL (ref 0.0–0.1)
Basophils Relative: 0 %
Eosinophils Absolute: 0.2 10*3/uL (ref 0.0–0.7)
Eosinophils Relative: 3 %
HCT: 35 % — ABNORMAL LOW (ref 39.0–52.0)
HEMOGLOBIN: 12 g/dL — AB (ref 13.0–17.0)
LYMPHS PCT: 29 %
Lymphs Abs: 1.5 10*3/uL (ref 0.7–4.0)
MCH: 28.8 pg (ref 26.0–34.0)
MCHC: 34.3 g/dL (ref 30.0–36.0)
MCV: 84.1 fL (ref 78.0–100.0)
MONO ABS: 0.6 10*3/uL (ref 0.1–1.0)
MONOS PCT: 12 %
NEUTROS ABS: 3 10*3/uL (ref 1.7–7.7)
Neutrophils Relative %: 56 %
Platelets: 123 10*3/uL — ABNORMAL LOW (ref 150–400)
RBC: 4.16 MIL/uL — ABNORMAL LOW (ref 4.22–5.81)
RDW: 14.7 % (ref 11.5–15.5)
WBC: 5.3 10*3/uL (ref 4.0–10.5)

## 2015-10-22 LAB — BASIC METABOLIC PANEL
ANION GAP: 7 (ref 5–15)
BUN: 7 mg/dL (ref 6–20)
CO2: 25 mmol/L (ref 22–32)
Calcium: 8.2 mg/dL — ABNORMAL LOW (ref 8.9–10.3)
Chloride: 107 mmol/L (ref 101–111)
Creatinine, Ser: 1 mg/dL (ref 0.61–1.24)
GFR calc Af Amer: >60 mL/min (ref >60)
GFR calc non Af Amer: >60 mL/min (ref >60)
GLUCOSE: 114 mg/dL — AB (ref 65–99)
POTASSIUM: 3.8 mmol/L (ref 3.5–5.1)
Sodium: 139 mmol/L (ref 135–145)

## 2015-10-22 NOTE — Progress Notes (Addendum)
Patient given d/c AVS including info on cerebral aneurysms, angiogram, embolization. Included education on stroke signs/symptoms and risk factors. Information reviewed including d/c medications and questions answered. Daughter present during discussion. Patient's IV d/c'd, pt dressed. Patient d/c home with daughter via wheelchair & RN escort. R thigh pain controlled. Patient belongings including dentures sent with patient & daughter.

## 2015-10-22 NOTE — Discharge Instructions (Signed)
Cerebral Angiogram, Care After °Refer to this sheet in the next few weeks. These instructions provide you with information on caring for yourself after your procedure. Your health care provider may also give you more specific instructions. Your treatment has been planned according to current medical practices, but problems sometimes occur. Call your health care provider if you have any problems or questions after your procedure. °WHAT TO EXPECT AFTER THE PROCEDURE °After your procedure, it is typical to have the following: °· Bruising at the catheter insertion site that usually fades within 1-2 weeks. °· Blood collecting in the tissue (hematoma) that may be painful to the touch. It should usually decrease in size and tenderness within 1-2 weeks. °· A mild headache. °HOME CARE INSTRUCTIONS °· Take medicines only as directed by your health care provider. °· You may shower 24-48 hours after the procedure or as directed by your health care provider. Remove the bandage (dressing) and gently wash the site with plain soap and water. Pat the area dry with a clean towel. Do not rub the site, because this may cause bleeding. °· Do not take baths, swim, or use a hot tub until your health care provider approves. °· Check your insertion site every day for redness, swelling, or drainage. °· Do not apply powder or lotion to the site. °· Do not lift over 10 lb (4.5 kg) for 5 days after your procedure or as directed by your health care provider. °· Ask your health care provider when it is okay to: °¨ Return to work or school. °¨ Resume usual physical activities or sports. °¨ Resume sexual activity. °· Do not drive home if you are discharged the same day as the procedure. Have someone else drive you. °· You may drive 24 hours after the procedure unless otherwise instructed by your health care provider. °· Do not operate machinery or power tools for 24 hours after the procedure or as directed by your health care provider. °· If your  procedure was done as an outpatient procedure, which means that you went home the same day as your procedure, a responsible adult should be with you for the first 24 hours after you arrive home. °· Keep all follow-up visits as directed by your health care provider. This is important. °SEEK MEDICAL CARE IF: °· You have a fever. °· You have chills. °· You have increased bleeding from the catheter insertion site. Hold pressure on the site. °SEEK IMMEDIATE MEDICAL CARE IF: °· You have vision changes or loss of vision. °· You have numbness or weakness on one side of your body. °· You have difficulty talking, or you have slurred speech or cannot speak (aphasia). °· You feel confused or have difficulty remembering. °· You have unusual pain at the catheter insertion site. °· You have redness, warmth, or swelling at the catheter insertion site. °· You have drainage (other than a small amount of blood on the dressing) from the catheter insertion site. °· The catheter insertion site is bleeding, and the bleeding does not stop after 30 minutes of holding steady pressure on the site. °These symptoms may represent a serious problem that is an emergency. Do not wait to see if the symptoms will go away. Get medical help right away. Call your local emergency services (911 in [place] Do not drive yourself to the hospital. °  °This information is not intended to replace advice given to you by your health care provider. Make sure you discuss any questions you have with your   health care provider. °  °Document Released: 12/16/2013 Document Revised: 05/20/2014 Document Reviewed: 12/16/2013 °Elsevier Interactive Patient Education ©2016 [employer] ° °

## 2015-10-22 NOTE — Discharge Summary (Signed)
Physician Discharge Summary      Patient ID: EVERTH LAPINE MRN: [id number] DOB/AGE: 02-08-1941 75 y.o.  Admit date: 10/21/2015 Discharge date: 10/22/2015  Admission Diagnoses: (R)MCA aneurysm  Discharge Diagnoses:  (R)MCA aneurysm   Procedures: Procedure(s): CEREBRAL ANGIOGRAM WITH PLACEMENT OF LVIS JR FLOW DIVERTING STENT ACROSS (R)MCA ANEURYSM  Discharged Condition: good  Hospital Course: Admitted to Neuro ICU post procedure in stable condition. No overnight issues or complications. POD#1 exam stable without deficit. Determined stable for discharge All medications, instructions, and follow up plan reviewed.   Consults: None   Discharge Exam: Blood pressure 110/59, pulse 70, temperature 98.1 F (36.7 C), temperature source Oral, resp. rate 22, height 5\' 10"  (1.778 m), weight 165 lb 12.6 oz (75.2 kg), SpO2 94 %.   Gen: NAD Neuro: PEERLA, EOMI No drift, fine motor intact, no deficit Tongue midline, Face symmetric Strength 5/5 UE and LE  Lungs: CTA Heart: Regular  Groin: soft, NT, no hematoma Feet warm  Disposition: 01-Home or Self Care  Discharge Instructions    Call MD for:  redness, tenderness, or signs of infection (pain, swelling, redness, odor or green/yellow discharge around incision site)    Complete by:  As directed      Call MD for:  severe uncontrolled pain    Complete by:  As directed      Diet - low sodium heart healthy    Complete by:  As directed      Increase activity slowly    Complete by:  As directed      May shower / Bathe    Complete by:  As directed      May walk up steps    Complete by:  As directed      Remove dressing in 24 hours    Complete by:  As directed             Medication List    TAKE these medications        aspirin EC 325 MG tablet  Take 325 mg by mouth daily.     B-12 PO  Take 1 tablet by mouth daily.     bisoprolol-hydrochlorothiazide 2.5-6.25 MG tablet  Commonly known as:  ZIAC  Take 1 tablet by mouth  daily.     citalopram 20 MG tablet  Commonly known as:  CELEXA  Take 20 mg by mouth daily.     clopidogrel 75 MG tablet  Commonly known as:  PLAVIX  Take 75 mg by mouth daily.     levothyroxine 150 MCG tablet  Commonly known as:  SYNTHROID, LEVOTHROID  Take 150 mcg by mouth daily.     lovastatin 20 MG tablet  Commonly known as:  MEVACOR  Take 20 mg by mouth daily.           Follow-up Information    Follow up with DEVESHWAR, Fritz Pickerel, MD. Go in 2 weeks.   Specialty:  Interventional Radiology   Why:  Post procedure eval. Office will call with appointment date/time   Contact information:   [address] [place] [address] [phone]       Signed: Ascencion Dike PA-C 10/22/2015, 11:24 AM

## 2015-10-23 ENCOUNTER — Telehealth: Payer: Self-pay | Admitting: *Deleted

## 2015-10-23 NOTE — Telephone Encounter (Signed)
Pt was on TCM list admitted for (R) MCA aneurysm he had a cerebral angiogram w/placement of LVIS JR flow diverting stent. Pt will be f/u with Dr. Threasa Alpha in 2 wks...Johny Chess

## 2015-10-27 ENCOUNTER — Other Ambulatory Visit: Payer: Self-pay | Admitting: Physician Assistant

## 2015-10-27 DIAGNOSIS — I671 Cerebral aneurysm, nonruptured: Secondary | ICD-10-CM

## 2015-10-27 LAB — PLATELET INHIBITION P2Y12: Platelet Function  P2Y12: 19 [PRU] — ABNORMAL LOW (ref 194–418)

## 2015-11-04 ENCOUNTER — Ambulatory Visit (HOSPITAL_COMMUNITY)
Admission: RE | Admit: 2015-11-04 | Discharge: 2015-11-04 | Disposition: A | Discharge status: Home / Self Care | Payer: Medicare Other | Source: Ambulatory Visit | Attending: Radiology | Admitting: Radiology

## 2015-11-04 DIAGNOSIS — I671 Cerebral aneurysm, nonruptured: Secondary | ICD-10-CM

## 2015-11-20 ENCOUNTER — Other Ambulatory Visit: Payer: Self-pay | Admitting: General Surgery

## 2015-11-20 LAB — PLATELET INHIBITION P2Y12: Platelet Function  P2Y12: 28 [PRU] — ABNORMAL LOW (ref 194–418)

## 2015-11-23 ENCOUNTER — Telehealth (HOSPITAL_COMMUNITY): Payer: Self-pay | Admitting: *Deleted

## 2015-11-23 NOTE — Telephone Encounter (Signed)
Called patient regarding medication per Dr. Arlean Hopping  He is to take ASA 81mg  and he is already taking 37.5mg  Plavix daily

## 2015-11-27 ENCOUNTER — Other Ambulatory Visit (HOSPITAL_COMMUNITY): Payer: Self-pay | Admitting: *Deleted

## 2015-12-04 ENCOUNTER — Telehealth (HOSPITAL_COMMUNITY): Payer: Self-pay | Admitting: *Deleted

## 2015-12-04 NOTE — Telephone Encounter (Signed)
Called and LM for patient to call me back regarding his medication

## 2015-12-07 ENCOUNTER — Telehealth (HOSPITAL_COMMUNITY): Payer: Self-pay | Admitting: *Deleted

## 2015-12-07 NOTE — Telephone Encounter (Signed)
Called patient and gave him the instructions per Dr. Estanislado Pandy that he is to take ASA 81 mg everyday and Plavix 37.5mg  every other day.  He repeated the instructions back to me.  He is to return in 2 weeks for repeat p2y12, he voiced understanding of this as well

## 2015-12-24 ENCOUNTER — Other Ambulatory Visit: Payer: Self-pay | Admitting: General Surgery

## 2015-12-24 LAB — PLATELET INHIBITION P2Y12: Platelet Function  P2Y12: 103 [PRU] — ABNORMAL LOW (ref 194–418)

## 2016-01-05 ENCOUNTER — Telehealth (HOSPITAL_COMMUNITY): Payer: Self-pay | Admitting: *Deleted

## 2016-01-05 NOTE — Telephone Encounter (Signed)
Called patient, per Dr. Estanislado Pandy pt is to take ASA 81mg  daily and Plavix 37.5 mg every other day.  He repeated instruction back to me.

## 2016-01-21 ENCOUNTER — Telehealth (HOSPITAL_COMMUNITY): Payer: Self-pay

## 2016-01-21 NOTE — Telephone Encounter (Signed)
Called to schedule 3 month f/u CT, no answer, voicemail was full. AW

## 2016-01-28 ENCOUNTER — Other Ambulatory Visit (HOSPITAL_COMMUNITY): Payer: Self-pay | Admitting: Interventional Radiology

## 2016-01-28 DIAGNOSIS — I671 Cerebral aneurysm, nonruptured: Secondary | ICD-10-CM

## 2016-02-05 ENCOUNTER — Ambulatory Visit (HOSPITAL_COMMUNITY): Payer: Medicare Other

## 2016-02-05 ENCOUNTER — Ambulatory Visit (HOSPITAL_COMMUNITY)
Admission: RE | Admit: 2016-02-05 | Discharge: 2016-02-05 | Disposition: A | Discharge status: Home / Self Care | Payer: Medicare Other | Source: Ambulatory Visit | Attending: Interventional Radiology | Admitting: Interventional Radiology

## 2016-02-05 DIAGNOSIS — I671 Cerebral aneurysm, nonruptured: Secondary | ICD-10-CM | POA: Diagnosis not present

## 2016-02-05 DIAGNOSIS — I739 Peripheral vascular disease, unspecified: Secondary | ICD-10-CM | POA: Diagnosis not present

## 2016-02-05 DIAGNOSIS — G319 Degenerative disease of nervous system, unspecified: Secondary | ICD-10-CM | POA: Insufficient documentation

## 2016-02-05 LAB — CREATININE, SERUM
CREATININE: 1.14 mg/dL (ref 0.61–1.24)
GFR calc Af Amer: >60 mL/min (ref >60)

## 2016-02-05 MED ORDER — GADOBENATE DIMEGLUMINE 529 MG/ML IV SOLN
10.0000 mL | Freq: Once | INTRAVENOUS | Status: AC | PRN
  Administered 2016-02-05: 10 mL via INTRAVENOUS

## 2016-02-18 ENCOUNTER — Telehealth (HOSPITAL_COMMUNITY): Payer: Self-pay

## 2016-02-18 NOTE — Telephone Encounter (Signed)
Pt agreed to f/u in 6 months with a MRI. AW

## 2016-04-08 ENCOUNTER — Telehealth (HOSPITAL_COMMUNITY): Payer: Self-pay | Admitting: Radiology

## 2016-04-08 NOTE — Telephone Encounter (Signed)
Pt's dentist faxed a request for pt to stop Plavix x3 days for dental extraction. Deveshwar reviewed the request and wants the pt to come have a P2Y12 blood draw first before he will sign the form and allow him to DC Plavix x3 days. I called the pt and left a VM with this information and asked him to call me and let me know when he would be coming by for his P2Y12. JM

## 2016-08-29 ENCOUNTER — Telehealth (HOSPITAL_COMMUNITY): Payer: Self-pay

## 2016-08-29 NOTE — Telephone Encounter (Signed)
Called to schedule 6 month f/u mri, left message. AW

## 2016-10-05 ENCOUNTER — Other Ambulatory Visit (HOSPITAL_COMMUNITY): Payer: Self-pay | Admitting: Interventional Radiology

## 2016-10-05 DIAGNOSIS — I729 Aneurysm of unspecified site: Secondary | ICD-10-CM

## 2016-10-13 ENCOUNTER — Telehealth (HOSPITAL_COMMUNITY): Payer: Self-pay

## 2016-10-13 NOTE — Telephone Encounter (Signed)
Called to cancel mri, left message for pt to return call. AW

## 2016-10-14 ENCOUNTER — Ambulatory Visit (HOSPITAL_COMMUNITY): Admission: RE | Admit: 2016-10-14 | Payer: Medicare HMO | Source: Ambulatory Visit

## 2016-10-14 ENCOUNTER — Other Ambulatory Visit (HOSPITAL_COMMUNITY): Payer: Self-pay | Admitting: Interventional Radiology

## 2016-10-14 DIAGNOSIS — I729 Aneurysm of unspecified site: Secondary | ICD-10-CM

## 2016-10-28 ENCOUNTER — Other Ambulatory Visit (HOSPITAL_COMMUNITY): Payer: Self-pay | Admitting: Radiology

## 2016-10-28 ENCOUNTER — Ambulatory Visit (HOSPITAL_COMMUNITY)
Admission: RE | Admit: 2016-10-28 | Discharge: 2016-10-28 | Disposition: A | Discharge status: Home / Self Care | Payer: Medicare HMO | Source: Ambulatory Visit | Attending: Interventional Radiology | Admitting: Interventional Radiology

## 2016-10-28 DIAGNOSIS — I729 Aneurysm of unspecified site: Secondary | ICD-10-CM

## 2016-10-28 HISTORY — PX: IR GENERIC HISTORICAL: IMG1180011

## 2016-10-28 LAB — PLATELET INHIBITION P2Y12: PLATELET FUNCTION P2Y12: 218 [PRU] (ref 194–418)

## 2016-10-31 ENCOUNTER — Telehealth (HOSPITAL_COMMUNITY): Payer: Self-pay | Admitting: *Deleted

## 2016-10-31 NOTE — Telephone Encounter (Signed)
LM with return number.  Per Dr. Estanislado Pandy pt is to take Plavix 37.5mg  daly and ASA 325mg  daily

## 2016-11-01 ENCOUNTER — Encounter (HOSPITAL_COMMUNITY): Payer: Self-pay | Admitting: Interventional Radiology

## 2017-02-23 ENCOUNTER — Other Ambulatory Visit: Payer: Self-pay | Admitting: Family Medicine

## 2017-03-01 ENCOUNTER — Other Ambulatory Visit: Payer: Self-pay | Admitting: Family Medicine

## 2017-03-02 ENCOUNTER — Other Ambulatory Visit: Payer: Self-pay | Admitting: Family Medicine

## 2017-03-02 DIAGNOSIS — J84115 Respiratory bronchiolitis interstitial lung disease: Secondary | ICD-10-CM

## 2017-03-06 ENCOUNTER — Ambulatory Visit
Admission: RE | Admit: 2017-03-06 | Discharge: 2017-03-06 | Disposition: A | Discharge status: Home / Self Care | Payer: Medicare HMO | Source: Ambulatory Visit | Attending: Family Medicine | Admitting: Family Medicine

## 2017-03-06 DIAGNOSIS — J84115 Respiratory bronchiolitis interstitial lung disease: Secondary | ICD-10-CM

## 2017-06-13 ENCOUNTER — Telehealth (HOSPITAL_COMMUNITY): Payer: Self-pay | Admitting: *Deleted

## 2017-06-13 NOTE — Telephone Encounter (Signed)
Per Jannifer Franklin PA-C called in generic prescription for Plavix.  Pt's pharmacy had faxed request in for this to be done.

## 2017-09-27 ENCOUNTER — Other Ambulatory Visit (HOSPITAL_COMMUNITY): Payer: Self-pay | Admitting: Interventional Radiology

## 2017-09-27 ENCOUNTER — Telehealth (HOSPITAL_COMMUNITY): Payer: Self-pay

## 2017-09-27 DIAGNOSIS — I729 Aneurysm of unspecified site: Secondary | ICD-10-CM

## 2017-09-27 DIAGNOSIS — G4452 New daily persistent headache (NDPH): Secondary | ICD-10-CM

## 2017-09-27 NOTE — Telephone Encounter (Signed)
Called daughter to inform her that we have sent in a request for f/u mri, I will give her a call when I get authorization. AW

## 2017-10-10 ENCOUNTER — Telehealth (HOSPITAL_COMMUNITY): Payer: Self-pay

## 2017-10-10 NOTE — Telephone Encounter (Signed)
Called to schedule mra, left message for daughter to return call. AW

## 2017-10-19 ENCOUNTER — Other Ambulatory Visit (HOSPITAL_COMMUNITY): Payer: Self-pay

## 2017-10-19 ENCOUNTER — Ambulatory Visit (HOSPITAL_COMMUNITY)
Admission: RE | Admit: 2017-10-19 | Discharge: 2017-10-19 | Disposition: A | Discharge status: Home / Self Care | Payer: Medicare HMO | Source: Ambulatory Visit | Attending: Interventional Radiology | Admitting: Interventional Radiology

## 2017-10-19 DIAGNOSIS — G4452 New daily persistent headache (NDPH): Secondary | ICD-10-CM | POA: Insufficient documentation

## 2017-10-19 DIAGNOSIS — I671 Cerebral aneurysm, nonruptured: Secondary | ICD-10-CM

## 2017-10-19 DIAGNOSIS — I729 Aneurysm of unspecified site: Secondary | ICD-10-CM

## 2017-10-19 LAB — PLATELET INHIBITION P2Y12: Platelet Function  P2Y12: 174 [PRU] — ABNORMAL LOW (ref 194–418)

## 2017-10-27 ENCOUNTER — Telehealth (HOSPITAL_COMMUNITY): Payer: Self-pay | Admitting: Radiology

## 2017-10-27 NOTE — Telephone Encounter (Signed)
Called pt's daughter, left VM. Pt will have f/u MRI/MRA in 6 month's time. P2Y12 test was fine. Continue as is per Dr. Estanislado Pandy. JM

## 2018-03-20 ENCOUNTER — Other Ambulatory Visit (HOSPITAL_COMMUNITY): Payer: Self-pay | Admitting: Interventional Radiology

## 2018-04-12 ENCOUNTER — Other Ambulatory Visit (HOSPITAL_COMMUNITY): Payer: Self-pay | Admitting: Interventional Radiology

## 2018-04-12 DIAGNOSIS — I729 Aneurysm of unspecified site: Secondary | ICD-10-CM

## 2018-04-20 ENCOUNTER — Ambulatory Visit (HOSPITAL_COMMUNITY)
Admission: RE | Admit: 2018-04-20 | Discharge: 2018-04-20 | Disposition: A | Discharge status: Home / Self Care | Payer: Medicare HMO | Source: Ambulatory Visit | Attending: Interventional Radiology | Admitting: Interventional Radiology

## 2018-04-20 DIAGNOSIS — I671 Cerebral aneurysm, nonruptured: Secondary | ICD-10-CM | POA: Diagnosis not present

## 2018-04-20 DIAGNOSIS — I729 Aneurysm of unspecified site: Secondary | ICD-10-CM | POA: Diagnosis present

## 2018-04-24 ENCOUNTER — Telehealth (HOSPITAL_COMMUNITY): Payer: Self-pay

## 2018-04-24 NOTE — Telephone Encounter (Signed)
Daughter agreed to have pt f/u in 6 months with mra head w/o. AW

## 2018-05-31 ENCOUNTER — Encounter: Payer: Self-pay | Admitting: Gastroenterology

## 2018-06-29 ENCOUNTER — Encounter (INDEPENDENT_AMBULATORY_CARE_PROVIDER_SITE_OTHER): Payer: Self-pay

## 2018-06-29 ENCOUNTER — Encounter: Payer: Self-pay | Admitting: Gastroenterology

## 2018-06-29 ENCOUNTER — Ambulatory Visit: Payer: Medicare HMO | Admitting: Gastroenterology

## 2018-06-29 ENCOUNTER — Other Ambulatory Visit (INDEPENDENT_AMBULATORY_CARE_PROVIDER_SITE_OTHER): Payer: Medicare HMO

## 2018-06-29 VITALS — BP 120/80 | HR 64 | Ht 66.75 in | Wt 160.1 lb

## 2018-06-29 DIAGNOSIS — R634 Abnormal weight loss: Secondary | ICD-10-CM

## 2018-06-29 LAB — COMPREHENSIVE METABOLIC PANEL
ALT: 12 U/L (ref 0–53)
AST: 21 U/L (ref 0–37)
Albumin: 4.4 g/dL (ref 3.5–5.2)
Alkaline Phosphatase: 68 U/L (ref 39–117)
BUN: 16 mg/dL (ref 6–23)
CALCIUM: 9.7 mg/dL (ref 8.4–10.5)
CHLORIDE: 100 meq/L (ref 96–112)
CO2: 30 meq/L (ref 19–32)
CREATININE: 1.3 mg/dL (ref 0.40–1.50)
GFR: 56.67 mL/min — ABNORMAL LOW (ref >60.00)
GLUCOSE: 91 mg/dL (ref 70–99)
Potassium: 4.3 mEq/L (ref 3.5–5.1)
Sodium: 139 mEq/L (ref 135–145)
Total Bilirubin: 0.7 mg/dL (ref 0.2–1.2)
Total Protein: 7.4 g/dL (ref 6.0–8.3)

## 2018-06-29 LAB — CBC WITH DIFFERENTIAL/PLATELET
BASOS ABS: 0 10*3/uL (ref 0.0–0.1)
BASOS PCT: 0.8 % (ref 0.0–3.0)
EOS ABS: 0.3 10*3/uL (ref 0.0–0.7)
Eosinophils Relative: 4.3 % (ref 0.0–5.0)
HEMATOCRIT: 41.5 % (ref 39.0–52.0)
Hemoglobin: 14.1 g/dL (ref 13.0–17.0)
LYMPHS PCT: 25.6 % (ref 12.0–46.0)
Lymphs Abs: 1.7 10*3/uL (ref 0.7–4.0)
MCHC: 34 g/dL (ref 30.0–36.0)
MCV: 82.2 fl (ref 78.0–100.0)
MONO ABS: 0.8 10*3/uL (ref 0.1–1.0)
Monocytes Relative: 12 % (ref 3.0–12.0)
NEUTROS ABS: 3.7 10*3/uL (ref 1.4–7.7)
NEUTROS PCT: 57.3 % (ref 43.0–77.0)
PLATELETS: 219 10*3/uL (ref 150.0–400.0)
RBC: 5.05 Mil/uL (ref 4.22–5.81)
RDW: 14.5 % (ref 11.5–15.5)
WBC: 6.4 10*3/uL (ref 4.0–10.5)

## 2018-06-29 LAB — TSH: TSH: 2.37 u[IU]/mL (ref 0.35–4.50)

## 2018-06-29 NOTE — Progress Notes (Signed)
Referring Provider: Tamsen Roers, MD Primary Care Physician:  Angel Roers, MD   Reason for Consultation: Weight loss   IMPRESSION:  Weight loss without localizing symptoms History of colon polyps    - Tubular adenomas on index colonoscopy in 2000.     - Hyperplastic polyp was removed in 2004    -   Colonoscopy performed 11/20/2007 showed diverticulosis Plavix after cerebral artery aneurysm embolization 10/21/15  PLAN: CMP, CBC, TSH CT chest/abd/pevis with and without contrast Return to the clinic after the CT scan Consider colonoscopy off Plavix if cross-sectional imaging is negative   HPI: Angel Keller is a 77 y.o. retiree who is seen in consultation at the request of Angel Keller for further evaluation of weight loss.  The history is obtained to the patient, review of his electronic health record, and records provided by Angel Keller.  He has had an unintentional weight loss.  Embolization of right middle cerebral artery region aneurysm 10/21/15 presenting.  He has hyperlipidemia, hypertension, anxiety, depression and a history of colon polyps. He notes significant stress in his life.    Angel Keller has noticed some recent weight loss. He notes some stress, but, doesn't believe this is the cause. At baseline he eats two meals daily. No appetite over the last several months. Continues to eat.  The weight loss started in the peak of the summer. Angel Keller feels it was too hot to eat.   01/29/2018 166 pounds 03/15/2018 161 pounds 04/05/2018 163 pounds 05/14/2018 155 pounds 06/29/18: 160 pounds  He reports a 3 week history of malodorous flatus. Associated large stools. No significant constipation or diarrhea. No other associated symptoms. No identified exacerbating or relieving features.    History of reflux. Does not require medications. Symptoms are rare and very specific to food choices.   Guaiac stool studies were negative x3.  He is worried that he has cancer.   The patient is  previously been seen by Dr. Olevia Keller.  Tubular adenomas were identified on his index colonoscopy in 2000. Hyperplastic polyp was removed in 2004.  Colonoscopy performed 11/20/2007 showed diverticulosis.  No further polyps were removed.   Past Medical History:  Diagnosis Date  . Aneurysm ([place])   . Anxiety   . Hx of inguinal hernia repair   . Hypertension   . Hypothyroidism   . Overactive bladder   . Pneumonia   . Shingles   . Thyroid disease     Past Surgical History:  Procedure Laterality Date  . BILATERAL CARPAL TUNNEL RELEASE    . ELBOW SURGERY Bilateral   . EYE SURGERY Bilateral    cataracts  . HERNIA REPAIR    . IR GENERIC HISTORICAL  10/28/2016   IR RADIOLOGIST EVAL & MGMT 10/28/2016 [facility]  . RADIOLOGY WITH ANESTHESIA N/A 10/21/2015   Procedure: RADIOLOGY WITH ANESTHESIA;  Surgeon: Luanne Bras, MD;  Location: [facility];  Service: Radiology;  Laterality: N/A;  . ROTATOR CUFF REPAIR Bilateral   . VARICOSE VEIN SURGERY      Current Outpatient Medications  Medication Sig Dispense Refill  . carbamazepine (TEGRETOL) 200 MG tablet Take 200 mg by mouth at bedtime.    Marland Kitchen aspirin EC 325 MG tablet Take 81 mg by mouth daily.    . bisoprolol-hydrochlorothiazide (ZIAC) 2.5-6.25 MG tablet Take 1 tablet by mouth daily.    . citalopram (CELEXA) 20 MG tablet Take 20 mg by mouth daily.    . clopidogrel (PLAVIX) 75 MG tablet Take 75 mg by  mouth daily.    . Cyanocobalamin (B-12 PO) Take 1 tablet by mouth daily.    Marland Kitchen levothyroxine (SYNTHROID, LEVOTHROID) 150 MCG tablet Take 150 mcg by mouth daily.    Marland Kitchen lovastatin (MEVACOR) 20 MG tablet Take 20 mg by mouth daily.     No current facility-administered medications for this visit.     Allergies as of 06/29/2018  . (No Known Allergies)    No family history on file.  Social History   Socioeconomic History  . Marital status: Widowed    Spouse name: Not on file  . Number of children: Not on file  . Years of education: Not on file  .  Highest education level: Not on file  Occupational History  . Not on file  Social Needs  . Financial resource strain: Not on file  . Food insecurity:    Worry: Not on file    Inability: Not on file  . Transportation needs:    Medical: Not on file    Non-medical: Not on file  Tobacco Use  . Smoking status: Former Smoker    Packs/day: 0.25    Years: 1.00    Pack years: 0.25    Last attempt to quit: 08/15/1968    Years since quitting: 49.9  . Smokeless tobacco: Never Used  Substance and Sexual Activity  . Alcohol use: No  . Drug use: No  . Sexual activity: Not on file  Lifestyle  . Physical activity:    Days per week: Not on file    Minutes per session: Not on file  . Stress: Not on file  Relationships  . Social connections:    Talks on phone: Not on file    Gets together: Not on file    Attends religious service: Not on file    Active member of club or organization: Not on file    Attends meetings of clubs or organizations: Not on file    Relationship status: Not on file  . Intimate partner violence:    Fear of current or ex partner: Not on file    Emotionally abused: Not on file    Physically abused: Not on file    Forced sexual activity: Not on file  Other Topics Concern  . Not on file  Social History Narrative  . Not on file    Review of Systems: 12 system ROS is negative except as noted above.  There were no vitals filed for this visit.  Physical Exam: Vital signs were reviewed. General:   Alert, well-nourished, pleasant and cooperative in NAD Head:  Normocephalic and atraumatic. Eyes:  Sclera clear, no icterus.   Conjunctiva pink. Mouth:  No deformity or lesions.   Neck:  Supple; no thyromegaly. Lungs:  Clear throughout to auscultation.   No wheezes.  Heart:  Regular rate and rhythm; no murmurs Abdomen:  Soft, nontender, normal bowel sounds. No rebound or guarding. No hepatosplenomegaly Rectal:  Deferred  Msk:  Symmetrical without gross  deformities. Extremities:  No gross deformities or edema. Neurologic:  Alert and  oriented x4;  grossly nonfocal Skin:  No rash or bruise. Multiple tattoos.  Psych:  Alert and cooperative. Normal mood and affect.   Angel Keller L. Tarri Glenn Md, MPH [facility] 06/29/2018, 8:08 AM

## 2018-06-29 NOTE — Patient Instructions (Addendum)
Your provider has requested that you go to the basement level for lab work before leaving today. Press "B" on the elevator. The lab is located at the first door on the left as you exit the elevator.  You have been scheduled for a CT scan of the abdomen and pelvis at [facility] ([address]---this is in the same building as Press photographer).   You are scheduled on 07/16/18 at 11:30am. You should arrive 15 minutes prior to your appointment time for registration. Please follow the written instructions below on the day of your exam:  WARNING: IF YOU ARE ALLERGIC TO IODINE/X-RAY DYE, PLEASE NOTIFY RADIOLOGY IMMEDIATELY AT [phone]! YOU WILL BE GIVEN A 13 HOUR PREMEDICATION PREP.  1) Do not eat or drink anything after 7:30am (4 hours prior to your test) 2) You have been given 2 bottles of oral contrast to drink. The solution may taste better if refrigerated, but do NOT add ice or any other liquid to this solution. Shake well before drinking.    Drink 1 bottle of contrast @ 9:30am (2 hours prior to your exam)  Drink 1 bottle of contrast @ 10:30am (1 hour prior to your exam)  You may take any medications as prescribed with a small amount of water, if necessary. If you take any of the following medications: METFORMIN, GLUCOPHAGE, GLUCOVANCE, AVANDAMET, RIOMET, FORTAMET, [place] MET, JANUMET, GLUMETZA or METAGLIP, you MAY be asked to HOLD this medication 48 hours AFTER the exam.  The purpose of you drinking the oral contrast is to aid in the visualization of your intestinal tract. The contrast solution may cause some diarrhea. Depending on your individual set of symptoms, you may also receive an intravenous injection of x-ray contrast/dye. Plan on being at [facility] for 30 minutes or longer, depending on the type of exam you are having performed.  This test typically takes 30-45 minutes to complete.  If you have any questions regarding your exam or if you need to  reschedule, you may call the CT department at [phone] between the hours of 8:00 am and 5:00 pm, Monday-Friday.  ________________________________________________________________________

## 2018-07-16 ENCOUNTER — Ambulatory Visit (INDEPENDENT_AMBULATORY_CARE_PROVIDER_SITE_OTHER)
Admission: RE | Admit: 2018-07-16 | Discharge: 2018-07-16 | Disposition: A | Discharge status: Home / Self Care | Payer: Medicare HMO | Source: Ambulatory Visit | Attending: Gastroenterology | Admitting: Gastroenterology

## 2018-07-16 DIAGNOSIS — R634 Abnormal weight loss: Secondary | ICD-10-CM

## 2018-07-16 MED ORDER — IOPAMIDOL (ISOVUE-300) INJECTION 61%
100.0000 mL | Freq: Once | INTRAVENOUS | Status: AC | PRN
  Administered 2018-07-16: 100 mL via INTRAVENOUS

## 2018-10-15 ENCOUNTER — Telehealth: Payer: Self-pay

## 2018-10-15 NOTE — Telephone Encounter (Signed)
-----   Message from Marlon Pel, RN sent at 07/16/2018  5:19 PM EST ----- Needs a CT follow up see results from 07/16/18- [place]

## 2018-10-15 NOTE — Telephone Encounter (Signed)
Left message for patient to call back  

## 2018-10-16 NOTE — Telephone Encounter (Signed)
Letter mailed asking patient to call back to schedule.

## 2018-10-17 ENCOUNTER — Telehealth: Payer: Self-pay | Admitting: Gastroenterology

## 2018-10-17 DIAGNOSIS — K769 Liver disease, unspecified: Secondary | ICD-10-CM

## 2018-10-18 NOTE — Telephone Encounter (Signed)
Patient has been scheduled for 11/07/18 2:30.  He will come pick up the contrast and instructions.

## 2018-10-24 ENCOUNTER — Other Ambulatory Visit (HOSPITAL_COMMUNITY): Payer: Self-pay | Admitting: Interventional Radiology

## 2018-10-24 DIAGNOSIS — I729 Aneurysm of unspecified site: Secondary | ICD-10-CM

## 2018-11-01 ENCOUNTER — Ambulatory Visit (HOSPITAL_COMMUNITY): Payer: Medicare HMO

## 2018-11-07 ENCOUNTER — Inpatient Hospital Stay: Admission: RE | Admit: 2018-11-07 | Payer: Medicare HMO | Source: Ambulatory Visit

## 2018-12-03 ENCOUNTER — Ambulatory Visit (HOSPITAL_COMMUNITY): Payer: Medicare HMO

## 2018-12-03 ENCOUNTER — Encounter (HOSPITAL_COMMUNITY): Payer: Self-pay

## 2018-12-27 ENCOUNTER — Other Ambulatory Visit: Payer: Self-pay

## 2018-12-27 DIAGNOSIS — K769 Liver disease, unspecified: Secondary | ICD-10-CM

## 2018-12-27 DIAGNOSIS — R634 Abnormal weight loss: Secondary | ICD-10-CM

## 2019-01-01 ENCOUNTER — Other Ambulatory Visit (INDEPENDENT_AMBULATORY_CARE_PROVIDER_SITE_OTHER): Payer: Medicare HMO

## 2019-01-01 DIAGNOSIS — K769 Liver disease, unspecified: Secondary | ICD-10-CM | POA: Diagnosis not present

## 2019-01-01 DIAGNOSIS — R634 Abnormal weight loss: Secondary | ICD-10-CM

## 2019-01-01 LAB — BUN: BUN: 17 mg/dL (ref 6–23)

## 2019-01-01 LAB — CREATININE, SERUM: Creatinine, Ser: 1.21 mg/dL (ref 0.40–1.50)

## 2019-01-02 ENCOUNTER — Telehealth: Payer: Self-pay | Admitting: Gastroenterology

## 2019-01-02 NOTE — Telephone Encounter (Signed)
Spoke with patient and arranged follow up after Ct on 01/09/2019

## 2019-01-02 NOTE — Telephone Encounter (Signed)
Pt returned your call, pls call him again. °

## 2019-01-03 ENCOUNTER — Telehealth (HOSPITAL_COMMUNITY): Payer: Self-pay

## 2019-01-03 NOTE — Telephone Encounter (Signed)
Called to schedule f/u mra, no answer, left vm. AW 

## 2019-01-08 ENCOUNTER — Telehealth: Payer: Self-pay | Admitting: *Deleted

## 2019-01-08 NOTE — Telephone Encounter (Signed)

## 2019-01-09 ENCOUNTER — Other Ambulatory Visit: Payer: Self-pay

## 2019-01-09 ENCOUNTER — Ambulatory Visit (INDEPENDENT_AMBULATORY_CARE_PROVIDER_SITE_OTHER)
Admission: RE | Admit: 2019-01-09 | Discharge: 2019-01-09 | Disposition: A | Discharge status: Home / Self Care | Payer: Medicare HMO | Source: Ambulatory Visit | Attending: Gastroenterology | Admitting: Gastroenterology

## 2019-01-09 DIAGNOSIS — K769 Liver disease, unspecified: Secondary | ICD-10-CM | POA: Diagnosis not present

## 2019-01-09 MED ORDER — IOHEXOL 300 MG/ML  SOLN
100.0000 mL | Freq: Once | INTRAMUSCULAR | Status: AC | PRN
  Administered 2019-01-09: 100 mL via INTRAVENOUS

## 2019-01-10 NOTE — Progress Notes (Signed)
TELEHEALTH VISIT  Referring Provider: Tamsen Roers, MD Primary Care Physician:  Tamsen Roers, MD   Tele-visit due to COVID-19 pandemic Patient requested visit virtually, consented to the virtual encounter via audio enabled telemedicine application (Doximity) Contact made at: 13:27 01/14/19 Patient verified by name and date of birth Location of patient: Home Location provider:al  Home Names of persons participating: Me, patient, Angel Keller CMA Time spent on telehealth visit: 20 minutes I discussed the limitations of evaluation and management by telemedicine. The patient expressed understanding and agreed to proceed.  Chief complaint:  Weight loss   IMPRESSION:  Weight loss without localizing symptoms - weight now stable    - labs 06/19/18: normal liver enzymes, normal CBC, normal TSH 2.37 Abnormal CT, not worrisome based on follow-up images     - 07/16/18: no malignancy or acute abnormality, subcentimeter low-density lesion right hepatic lobe    - 01/09/19: stable hypodensities in the left lobe and the right lobe thought to be cysts History of colon polyps    - Tubular adenomas on index colonoscopy in 2000.     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 Small cysts in the liver that are unchanged when compared to December.  Records from Dr. [name] show that his weight has stabilized. No further evaluation recommended at this time. Would recommend EGD and colonoscopy off Plavix if weight loss becomes a concern in the future.   PLAN: Colonoscopy and EGD if the weight loss occurs again Follow-up PRN   HPI: Angel Keller is a 78 y.o. male who has been under evaluation for unexplained weight loss.  I met him in consultation June 29, 2018.  The interval history is provided by the patient and review of his electronic health  record. Dr. [name] provided information about his objective weight since his last visit.   A CT of the chest, abdomen and pelvis with contrast was performed July 16, 2018.  The CT scan showed no evidence of malignancy or acute abnormality.  There was subcentimeter low-density lesion in the inferior right hepatic lobe that was too small to fully characterize.  There was also suspected hepatic steatosis, aortic atherosclerosis, and a large prostate. Labs from 06/29/2018 show a normal comprehensive metabolic panel and a normal CBC.  TSH was 2.37.  A follow-up CT of the abdomen and pelvis with contrast 01/09/2019 showed stable hypodensities in the left lobe and right lobe of the liver thought to represent small cysts.  There were no other intra-abdominal abnormalities.  He has been following a healthy diet and is trying exercise. He doesn't feel like his arm muscles are as prominent as they were before he lost weight. He is feeling good. Good energy. Good appetite. He feels like his weight is back to normal. No ongoing GI complaints.   Weight summary: 01/29/2018 166 pounds 03/15/2018 161 pounds 04/05/2018 163 pounds 05/14/2018 155 pounds 06/29/18: 160 pounds 08/28/18: 167 pounds 10/18/18: 168 pounds 11/15/18: 166 pounds  Past Medical History:  Diagnosis Date  . Aneurysm ([place])   . Anxiety   . Arthritis   . Depression   . HLD (hyperlipidemia)   . Hx of inguinal hernia repair   . Hypertension   . Hypothyroidism   . Overactive bladder   . Pneumonia   . Shingles   . Thyroid disease     Past Surgical  History:  Procedure Laterality Date  . BILATERAL CARPAL TUNNEL RELEASE    . CATARACT EXTRACTION, BILATERAL Bilateral   . ELBOW SURGERY Bilateral   . INGUINAL HERNIA REPAIR Right 1951  . IR GENERIC HISTORICAL  10/28/2016   IR RADIOLOGIST EVAL & MGMT 10/28/2016 [facility]-INTERV RAD  . RADIOLOGY WITH ANESTHESIA N/A 10/21/2015   Procedure: RADIOLOGY WITH ANESTHESIA;  Surgeon: Luanne Bras, MD;  Location:  [facility];  Service: Radiology;  Laterality: N/A;  . ROTATOR CUFF REPAIR Bilateral   . VARICOSE VEIN SURGERY      Current Outpatient Medications  Medication Sig Dispense Refill  . aspirin EC 325 MG tablet Take 81 mg by mouth daily.    . bisoprolol-hydrochlorothiazide (ZIAC) 2.5-6.25 MG tablet Take 1 tablet by mouth daily.    . carbamazepine (TEGRETOL) 200 MG tablet Take 200 mg by mouth at bedtime.    . citalopram (CELEXA) 20 MG tablet Take 20 mg by mouth daily.    . clopidogrel (PLAVIX) 75 MG tablet Take 37.5 mg by mouth daily.     . Cyanocobalamin (B-12 PO) Take 1 tablet by mouth daily.    Marland Kitchen levothyroxine (SYNTHROID, LEVOTHROID) 150 MCG tablet Take 150 mcg by mouth daily.    Marland Kitchen lovastatin (MEVACOR) 20 MG tablet Take 20 mg by mouth daily.     No current facility-administered medications for this visit.     Allergies as of 01/14/2019  . (No Known Allergies)    Family History  Problem Relation Age of Onset  . Breast cancer Mother   . Lung cancer Father   . Alcoholism Sister   . Cirrhosis Sister     Social History   Socioeconomic History  . Marital status: Widowed    Spouse name: Not on file  . Number of children: 5  . Years of education: Not on file  . Highest education level: Not on file  Occupational History  . Occupation: retired  Scientific laboratory technician  . Financial resource strain: Not on file  . Food insecurity:    Worry: Not on file    Inability: Not on file  . Transportation needs:    Medical: Not on file    Non-medical: Not on file  Tobacco Use  . Smoking status: Former Smoker    Packs/day: 0.25    Years: 1.00    Pack years: 0.25    Last attempt to quit: 08/15/1968    Years since quitting: 50.4  . Smokeless tobacco: Never Used  Substance and Sexual Activity  . Alcohol use: No  . Drug use: No  . Sexual activity: Not on file  Lifestyle  . Physical activity:    Days per week: Not on file    Minutes per session: Not on file  . Stress: Not on file  Relationships  .  Social connections:    Talks on phone: Not on file    Gets together: Not on file    Attends religious service: Not on file    Active member of club or organization: Not on file    Attends meetings of clubs or organizations: Not on file    Relationship status: Not on file  . Intimate partner violence:    Fear of current or ex partner: Not on file    Emotionally abused: Not on file    Physically abused: Not on file    Forced sexual activity: Not on file  Other Topics Concern  . Not on file  Social History Narrative  . Not on  file    Review of Systems: ALL ROS discussed and all others negative except listed in HPI.  Physical Exam: General: in no acute distress Neuro: Alert and appropriate Psych: Normal affect and normal insight   Brennley Curtice L. Tarri Glenn, MD, MPH [facility] 01/10/2019, 12:34 PM

## 2019-01-14 ENCOUNTER — Other Ambulatory Visit: Payer: Self-pay

## 2019-01-14 ENCOUNTER — Encounter: Payer: Self-pay | Admitting: Gastroenterology

## 2019-01-14 ENCOUNTER — Ambulatory Visit (INDEPENDENT_AMBULATORY_CARE_PROVIDER_SITE_OTHER): Payer: Medicare HMO | Admitting: Gastroenterology

## 2019-01-14 ENCOUNTER — Telehealth: Payer: Self-pay | Admitting: General Surgery

## 2019-01-14 VITALS — Ht 70.0 in | Wt 163.0 lb

## 2019-01-14 DIAGNOSIS — R634 Abnormal weight loss: Secondary | ICD-10-CM

## 2019-01-14 DIAGNOSIS — K7689 Other specified diseases of liver: Secondary | ICD-10-CM | POA: Diagnosis not present

## 2019-01-14 NOTE — Telephone Encounter (Signed)
Pt returned your call to prescreen.  °

## 2019-01-14 NOTE — Patient Instructions (Signed)
Your recent CT scan was very reassuring. The small spots in your liver are cysts. These are not associated with cancer and do not need to be followed over time.   Please let me know if I can provide any additional assistance in the future. In particular, please let me know if you have any additional weight loss.

## 2019-01-14 NOTE — Telephone Encounter (Signed)
Tried to contact the patient to pre-screen for televist. Left a message on voicemail.

## 2019-01-15 ENCOUNTER — Ambulatory Visit (HOSPITAL_COMMUNITY)
Admission: RE | Admit: 2019-01-15 | Discharge: 2019-01-15 | Disposition: A | Discharge status: Home / Self Care | Payer: Medicare HMO | Source: Ambulatory Visit | Attending: Interventional Radiology | Admitting: Interventional Radiology

## 2019-01-15 DIAGNOSIS — I671 Cerebral aneurysm, nonruptured: Secondary | ICD-10-CM | POA: Diagnosis not present

## 2019-01-15 DIAGNOSIS — I729 Aneurysm of unspecified site: Secondary | ICD-10-CM

## 2019-01-29 ENCOUNTER — Other Ambulatory Visit (HOSPITAL_COMMUNITY): Payer: Self-pay | Admitting: Interventional Radiology

## 2019-01-29 DIAGNOSIS — I729 Aneurysm of unspecified site: Secondary | ICD-10-CM

## 2019-02-05 ENCOUNTER — Ambulatory Visit (HOSPITAL_COMMUNITY)
Admission: RE | Admit: 2019-02-05 | Discharge: 2019-02-05 | Disposition: A | Discharge status: Home / Self Care | Payer: Medicare HMO | Source: Ambulatory Visit | Attending: Interventional Radiology | Admitting: Interventional Radiology

## 2019-02-05 ENCOUNTER — Other Ambulatory Visit: Payer: Self-pay

## 2019-02-05 DIAGNOSIS — I729 Aneurysm of unspecified site: Secondary | ICD-10-CM

## 2019-02-05 NOTE — Progress Notes (Signed)
Chief Complaint: Patient was seen in consultation today for right MCA segment aneurysm s/p embolization.  Referring Physician(s): Little, Jeneen Rinks  Supervising Physician: Luanne Bras  Patient Status: [facility] - Out-pt  History of Present Illness: Angel Keller is a 78 y.o. male with a past medical history as below, with pertinent past medical history including hypertension, hyperlipidemia, thyroid disease, depression, and anxiety. He is known to [facility] and has been followed by Dr. Estanislado Pandy since 09/2015. He first presented to our department at the request of Dr. [name] for management of an incidental finding of a right MCA aneurysm. He first consulted with Dr. Estanislado Pandy 09/16/2015 to discuss management options. At that time, he decided to pursue endovascular embolization for management of his aneurysm, and underwent an image-guided cerebral arteriogram with embolization of his right MCA temporal branch aneurysm using a LVIS Jr device 10/21/2015 by Dr. Estanislado Pandy. He has since been followed with routine imaging scans to monitor for changes.  MRA head 01/15/2019: 1. 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 Patent right M1 stent. 2. Otherwise stable and negative intracranial MRA.  Patient presents today for follow-up to discuss results of recent MRA head 01/15/2019. Patient awake and alert sitting in chair with no complaints at this time. Accompanied by daughter. Denies headache, weakness, numbness/tingling, dizziness, vision changes, hearing changes, tinnitus, or speech difficulty.  Patient is currently taking Plavix 75 mg once daily and Aspirin 325 mg once daily.   Past Medical History:  Diagnosis Date   Aneurysm ([place])    Anxiety    Arthritis    Depression    HLD (hyperlipidemia)    Hx of inguinal hernia repair    Hypertension    Hypothyroidism    Overactive bladder     Pneumonia    Shingles    Thyroid disease     Past Surgical History:  Procedure Laterality Date   BILATERAL CARPAL TUNNEL RELEASE     CATARACT EXTRACTION, BILATERAL Bilateral    ELBOW SURGERY Bilateral    INGUINAL HERNIA REPAIR Right 1951   IR GENERIC HISTORICAL  10/28/2016   IR RADIOLOGIST EVAL & MGMT 10/28/2016 [facility]-INTERV RAD   RADIOLOGY WITH ANESTHESIA N/A 10/21/2015   Procedure: RADIOLOGY WITH ANESTHESIA;  Surgeon: Luanne Bras, MD;  Location: [place];  Service: Radiology;  Laterality: N/A;   ROTATOR CUFF REPAIR Bilateral    VARICOSE VEIN SURGERY      Allergies: Patient has no known allergies.  Medications: Prior to Admission medications   Medication Sig Start Date End Date Taking? Authorizing Provider  aspirin EC 325 MG tablet Take 81 mg by mouth daily.    [provider]  bisoprolol-hydrochlorothiazide (ZIAC) 2.5-6.25 MG tablet Take 1 tablet by mouth daily. 06/20/15   [provider]  carbamazepine (TEGRETOL) 200 MG tablet Take 200 mg by mouth at bedtime.    [provider]  Cholecalciferol (D3 ADULT PO) Take 2,000 Units by mouth daily.    [provider]  citalopram (CELEXA) 20 MG tablet Take 20 mg by mouth daily. 06/17/15   [provider]  clopidogrel (PLAVIX) 75 MG tablet Take 37.5 mg by mouth daily.     [provider]  Cyanocobalamin (B-12 PO) Take 1 tablet by mouth daily.    [provider]  escitalopram (LEXAPRO) 20 MG tablet Take 20 mg by mouth daily. 12/22/18   [provider]  levothyroxine (SYNTHROID, LEVOTHROID) 150 MCG tablet  Take 150 mcg by mouth daily. 09/07/15   [provider]  lovastatin (MEVACOR) 20 MG tablet Take 20 mg by mouth daily. 07/25/15   [provider]     Family History  Problem Relation Age of Onset   Breast cancer Mother    Lung cancer Father    Alcoholism Sister    Cirrhosis Sister    Colon cancer Neg Hx    Esophageal cancer Neg Hx     Liver cancer Neg Hx    Pancreatic cancer Neg Hx    Prostate cancer Neg Hx     Social History   Socioeconomic History   Marital status: Widowed    Spouse name: Not on file   Number of children: 5   Years of education: Not on file   Highest education level: Not on file  Occupational History   Occupation: retired  Scientist, product/process development strain: Not on file   Food insecurity    Worry: Not on file    Inability: Not on Lexicographer needs    Medical: Not on file    Non-medical: Not on file  Tobacco Use   Smoking status: Former Smoker    Packs/day: 0.25    Years: 1.00    Pack years: 0.25    Quit date: 08/15/1968    Years since quitting: 50.5   Smokeless tobacco: Never Used  Substance and Sexual Activity   Alcohol use: No   Drug use: No   Sexual activity: Not on file  Lifestyle   Physical activity    Days per week: Not on file    Minutes per session: Not on file   Stress: Not on file  Relationships   Social connections    Talks on phone: Not on file    Gets together: Not on file    Attends religious service: Not on file    Active member of club or organization: Not on file    Attends meetings of clubs or organizations: Not on file    Relationship status: Not on file  Other Topics Concern   Not on file  Social History Narrative   Not on file     Review of Systems: A 12 point ROS discussed and pertinent positives are indicated in the HPI above.  All other systems are negative.  Review of Systems  Constitutional: Negative for chills and fever.  HENT: Negative for hearing loss and tinnitus.   Eyes: Negative for visual disturbance.  Respiratory: Negative for shortness of breath and wheezing.   Cardiovascular: Negative for chest pain and palpitations.  Neurological: Negative for dizziness, speech difficulty, weakness, numbness and headaches.  Psychiatric/Behavioral: Negative for behavioral problems and confusion.    Vital  Signs: BP 140/86 (BP Location: Right Arm)   Physical Exam Vitals signs reviewed.  Constitutional:      General: He is not in acute distress.    Appearance: Normal appearance.  Pulmonary:     Effort: Pulmonary effort is normal. No respiratory distress.  Skin:    General: Skin is warm and dry.  Neurological:     Mental Status: He is alert and oriented to person, place, and time.  Psychiatric:        Mood and Affect: Mood normal.        Behavior: Behavior normal.        Thought Content: Thought content normal.        Judgment: Judgment normal.      Imaging:  Mr Virgel Paling F[facility] Contrast  Result Date: 01/16/2019 CLINICAL DATA:  78 year old male status post endovascular stent treatment of right MCA aneurysm. Aneurysm enlargement suspected on 2019 follow-up. Subsequent encounter. EXAM: MRA HEAD WITHOUT CONTRAST TECHNIQUE: Angiographic images of the Circle of Willis were obtained using MRA technique without intravenous contrast. COMPARISON:  Intracranial MRA 04/20/2018 and earlier. FINDINGS: No intracranial mass effect or ventriculomegaly. Stable antegrade flow in the posterior circulation with fairly codominant vertebral arteries. Patent PICA origins. No distal vertebral or vertebrobasilar junction stenosis. Patent basilar artery without stenosis. Normal SCA and PCA origins (shared origin on the right, normal variant). Posterior communicating arteries are diminutive or absent. Bilateral PCA branches are stable and within normal limits. Stable antegrade flow in both ICA siphons. No ICA stenosis. Normal ophthalmic artery origins. Normal carotid termini. The left MCA bifurcates early. The left M1, bifurcation, and visible left MCA branches are stable and within normal limits. Mild susceptibility artifact at the right M1 segment corresponding to vascular stent. Residual posteriorly and inferiorly situated globular area of flow signal measures 3 millimeters today (series 5, image 133), and was 2 millimeters  in March 2019. Some of the contiguous flow signal between the lesion and a right M2 branch is less apparent today compared to the September exam. Otherwise the right MCA branches are stable and within normal limits. IMPRESSION: 1. 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 Patent right M1 stent. 2. Otherwise stable and negative intracranial MRA. Electronically Signed   By: Genevie Ann M.D.   On: 01/16/2019 09:50   Ct Abdomen Pelvis W Contrast  Result Date: 01/09/2019 CLINICAL DATA:  Follow-up hypodensity within the right hepatic lobe EXAM: CT ABDOMEN AND PELVIS WITH CONTRAST TECHNIQUE: Multidetector CT imaging of the abdomen and pelvis was performed using the standard protocol following bolus administration of intravenous contrast. CONTRAST:  152mL OMNIPAQUE IOHEXOL 300 MG/ML  SOLN COMPARISON:  07/16/2018 FINDINGS: Lower chest: No acute abnormality. Hepatobiliary: Fatty infiltration of the liver is noted. A few tiny hypodensities are noted within the liver 1 within the left lobe on image number 17 of series 2 and 1 within the right lobe on image number 30 of series 2. These are stable in appearance from the prior exam it statistically likely represent small cysts. The left lobe liver lesion was present on a prior CT from 2018. the gallbladder is within normal limits. Pancreas: Unremarkable. No pancreatic ductal dilatation or surrounding inflammatory changes. Spleen: Normal in size without focal abnormality. Adrenals/Urinary Tract: Adrenal glands are within normal limits. Kidneys are well visualized without renal calculi or obstructive changes. Bladder is within normal limits. Stomach/Bowel: Scattered diverticular changes noted without evidence of diverticulitis. No obstructive or inflammatory changes of the bowel are seen. The appendix is not well visualized. No inflammatory changes to suggest  appendicitis are noted. Small bowel is within normal limits. Stomach is unremarkable. Vascular/Lymphatic: Aortic atherosclerosis. No enlarged abdominal or pelvic lymph nodes. Reproductive: Prostate is unremarkable. Other: No abdominal wall hernia or abnormality. No abdominopelvic ascites. Musculoskeletal: Degenerative changes of lumbar spine are noted. IMPRESSION: Hypodensities are again seen within the left lobe and right lobe of the liver. The right lobe lesion is stable from the recent exam from 2019. Left lobe lesion is stable from 2018. Statistically these likely represent small cysts. Chronic changes without acute abnormality. Electronically Signed   By: Inez Catalina M.D.   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 Estanislado Pandy. Dr. Estanislado Pandy was present for consultation.  Discussed neurological symptoms. Patient states that he is asymptomatic at this time.   Discussed results of recent MRA and explained that his aneurysm has shrunk, but there is still residual filling- stable from MRA 6 months prior. Explained that there are two management options moving forward- either routine imaging scans to monitor for changes versus an image-guided cerebral arteriogram with possible embolization of right MCA aneurysm. Discussed procedure, including risks and benefits. Patient asks that he has time to think about which management option to pursue.  Discussed patients hypertension. Patient states that  he is unsure of his recent BP readings- today BP 140/86. Instructed patient to take anti-hypertension medications as prescribed and to follow-up with his PCP regularly for hypertensive management.  Plan for follow-up with an image-guided cerebral arteriogram with intent to treat right MCA aneurysm ASAP versus MRA head (without contrast) in 6 months.  Patient states that he will call us once he decides which management option to pursue. Instructed patient to continue taking Plavix 75 mg once daily and Aspirin 325 mg once daily.  All questions answered and concerns addressed. Patient and daughter convey understanding and agree with plan.  Thank you for this interesting consult.  I greatly enjoyed meeting Angel Keller and look forward to participating in their care.  A copy of this report was sent to the requesting provider on this date.   Electronically Signed: Earley Abide, PA-C 02/05/2019, 1:07 PM   I spent a total of 40 Minutes in face to face in clinical consultation, greater than 50% of which was counseling/coordinating care for right MCA segment aneurysm s/p embolization.

## 2019-02-07 ENCOUNTER — Other Ambulatory Visit (HOSPITAL_COMMUNITY): Payer: Self-pay | Admitting: Interventional Radiology

## 2019-02-07 DIAGNOSIS — I671 Cerebral aneurysm, nonruptured: Secondary | ICD-10-CM

## 2019-02-18 ENCOUNTER — Other Ambulatory Visit (HOSPITAL_COMMUNITY)
Admission: RE | Admit: 2019-02-18 | Discharge: 2019-02-18 | Disposition: A | Discharge status: Home / Self Care | Payer: Medicare HMO | Source: Ambulatory Visit | Attending: Interventional Radiology | Admitting: Interventional Radiology

## 2019-02-18 DIAGNOSIS — Z01812 Encounter for preprocedural laboratory examination: Secondary | ICD-10-CM | POA: Diagnosis not present

## 2019-02-18 DIAGNOSIS — Z1159 Encounter for screening for other viral diseases: Secondary | ICD-10-CM | POA: Insufficient documentation

## 2019-02-19 ENCOUNTER — Other Ambulatory Visit: Payer: Self-pay

## 2019-02-19 ENCOUNTER — Encounter (HOSPITAL_COMMUNITY): Payer: Self-pay | Admitting: *Deleted

## 2019-02-19 ENCOUNTER — Other Ambulatory Visit: Payer: Self-pay | Admitting: Student

## 2019-02-19 LAB — SARS CORONAVIRUS 2 (TAT 6-24 HRS): SARS Coronavirus 2: NEGATIVE

## 2019-02-19 NOTE — Progress Notes (Signed)
Spoke with pt's daughter, Lattie Haw for pre-op call. DPR on file. She states pt has no cardiac history and is not diabetic. Is treated for HTN. PCP is Nile Riggs, FNP. She states her dad has comprehension issues, has been more confused recently. She states she was told by Anderson Malta with Dr. Arlean Hopping office that she could come in with him the morning of surgery.   Pt had Covid 19 test done yesterday, it is negative.   Coronavirus Screening  Have you experienced the following symptoms:  Cough NO Fever (>100.56F)  NO Runny nose NO Sore throat NO Difficulty breathing/shortness of breath  NO  Have you or a family member traveled in the last 14 days and where? NO   Lattie Haw was reminded that hospital visitation restrictions are in effect and the importance of the restrictions. I explained to her that I do not know if she will be allowed to stay with him overnight.

## 2019-02-20 ENCOUNTER — Other Ambulatory Visit (HOSPITAL_COMMUNITY): Payer: Medicare HMO

## 2019-02-20 ENCOUNTER — Ambulatory Visit (HOSPITAL_COMMUNITY)
Admission: RE | Admit: 2019-02-20 | Discharge: 2019-02-20 | Disposition: A | Discharge status: Home / Self Care | Payer: Medicare HMO | Source: Ambulatory Visit | Attending: Interventional Radiology | Admitting: Interventional Radiology

## 2019-02-20 ENCOUNTER — Ambulatory Visit (HOSPITAL_COMMUNITY): Payer: Medicare HMO | Admitting: Anesthesiology

## 2019-02-20 ENCOUNTER — Encounter (HOSPITAL_COMMUNITY): Payer: Self-pay

## 2019-02-20 ENCOUNTER — Other Ambulatory Visit: Payer: Self-pay

## 2019-02-20 ENCOUNTER — Encounter (HOSPITAL_COMMUNITY)
Admission: RE | Disposition: A | Discharge status: Home / Self Care | Payer: Self-pay | Source: Home / Self Care | Attending: Interventional Radiology

## 2019-02-20 ENCOUNTER — Ambulatory Visit (HOSPITAL_COMMUNITY)
Admission: RE | Admit: 2019-02-20 | Discharge: 2019-02-20 | Disposition: A | Discharge status: Home / Self Care | Payer: Medicare HMO | Attending: Interventional Radiology | Admitting: Interventional Radiology

## 2019-02-20 DIAGNOSIS — I10 Essential (primary) hypertension: Secondary | ICD-10-CM | POA: Insufficient documentation

## 2019-02-20 DIAGNOSIS — M1990 Unspecified osteoarthritis, unspecified site: Secondary | ICD-10-CM | POA: Diagnosis not present

## 2019-02-20 DIAGNOSIS — E039 Hypothyroidism, unspecified: Secondary | ICD-10-CM | POA: Insufficient documentation

## 2019-02-20 DIAGNOSIS — Z7902 Long term (current) use of antithrombotics/antiplatelets: Secondary | ICD-10-CM | POA: Diagnosis not present

## 2019-02-20 DIAGNOSIS — I671 Cerebral aneurysm, nonruptured: Secondary | ICD-10-CM | POA: Diagnosis not present

## 2019-02-20 DIAGNOSIS — Z7982 Long term (current) use of aspirin: Secondary | ICD-10-CM | POA: Insufficient documentation

## 2019-02-20 DIAGNOSIS — F419 Anxiety disorder, unspecified: Secondary | ICD-10-CM | POA: Insufficient documentation

## 2019-02-20 DIAGNOSIS — Z87891 Personal history of nicotine dependence: Secondary | ICD-10-CM | POA: Diagnosis not present

## 2019-02-20 DIAGNOSIS — Z79899 Other long term (current) drug therapy: Secondary | ICD-10-CM | POA: Diagnosis not present

## 2019-02-20 DIAGNOSIS — E785 Hyperlipidemia, unspecified: Secondary | ICD-10-CM | POA: Insufficient documentation

## 2019-02-20 DIAGNOSIS — F329 Major depressive disorder, single episode, unspecified: Secondary | ICD-10-CM | POA: Diagnosis not present

## 2019-02-20 DIAGNOSIS — Z79890 Hormone replacement therapy (postmenopausal): Secondary | ICD-10-CM | POA: Insufficient documentation

## 2019-02-20 HISTORY — PX: RADIOLOGY WITH ANESTHESIA: SHX6223

## 2019-02-20 HISTORY — DX: Headache, unspecified: R51.9

## 2019-02-20 HISTORY — PX: IR ANGIO INTRA EXTRACRAN SEL COM CAROTID INNOMINATE BILAT MOD SED: IMG5360

## 2019-02-20 LAB — CBC WITH DIFFERENTIAL/PLATELET
Abs Immature Granulocytes: 0.01 10*3/uL (ref 0.00–0.07)
Basophils Absolute: 0 10*3/uL (ref 0.0–0.1)
Basophils Relative: 1 %
Eosinophils Absolute: 0.4 10*3/uL (ref 0.0–0.5)
Eosinophils Relative: 7 %
HCT: 39.5 % (ref 39.0–52.0)
Hemoglobin: 12.7 g/dL — ABNORMAL LOW (ref 13.0–17.0)
Immature Granulocytes: 0 %
Lymphocytes Relative: 27 %
Lymphs Abs: 1.3 10*3/uL (ref 0.7–4.0)
MCH: 27.4 pg (ref 26.0–34.0)
MCHC: 32.2 g/dL (ref 30.0–36.0)
MCV: 85.3 fL (ref 80.0–100.0)
Monocytes Absolute: 0.6 10*3/uL (ref 0.1–1.0)
Monocytes Relative: 12 %
Neutro Abs: 2.6 10*3/uL (ref 1.7–7.7)
Neutrophils Relative %: 53 %
Platelets: 172 10*3/uL (ref 150–400)
RBC: 4.63 MIL/uL (ref 4.22–5.81)
RDW: 13.8 % (ref 11.5–15.5)
WBC: 4.9 10*3/uL (ref 4.0–10.5)
nRBC: 0 % (ref 0.0–0.2)

## 2019-02-20 LAB — POCT ACTIVATED CLOTTING TIME: Activated Clotting Time: 131 seconds

## 2019-02-20 LAB — URINALYSIS, COMPLETE (UACMP) WITH MICROSCOPIC
Bilirubin Urine: NEGATIVE
Glucose, UA: NEGATIVE mg/dL
Hgb urine dipstick: NEGATIVE
Ketones, ur: NEGATIVE mg/dL
Leukocytes,Ua: NEGATIVE
Nitrite: NEGATIVE
Protein, ur: NEGATIVE mg/dL
Specific Gravity, Urine: 1.021 (ref 1.005–1.030)
pH: 5 (ref 5.0–8.0)

## 2019-02-20 LAB — BASIC METABOLIC PANEL
Anion gap: 11 (ref 5–15)
BUN: 17 mg/dL (ref 8–23)
CO2: 24 mmol/L (ref 22–32)
Calcium: 9.2 mg/dL (ref 8.9–10.3)
Chloride: 101 mmol/L (ref 98–111)
Creatinine, Ser: 1.41 mg/dL — ABNORMAL HIGH (ref 0.61–1.24)
GFR calc Af Amer: 55 mL/min — ABNORMAL LOW (ref >60)
GFR calc non Af Amer: 47 mL/min — ABNORMAL LOW (ref >60)
Glucose, Bld: 107 mg/dL — ABNORMAL HIGH (ref 70–99)
Potassium: 3.7 mmol/L (ref 3.5–5.1)
Sodium: 136 mmol/L (ref 135–145)

## 2019-02-20 LAB — PLATELET INHIBITION P2Y12: Platelet Function  P2Y12: 256 [PRU] (ref 182–335)

## 2019-02-20 LAB — APTT: aPTT: 29 seconds (ref 24–36)

## 2019-02-20 LAB — PROTIME-INR
INR: 1.1 (ref 0.8–1.2)
Prothrombin Time: 14.1 seconds (ref 11.4–15.2)

## 2019-02-20 SURGERY — IR WITH ANESTHESIA
Anesthesia: Monitor Anesthesia Care

## 2019-02-20 MED ORDER — LIDOCAINE HCL 1 % IJ SOLN
INTRAMUSCULAR | Status: AC
  Filled 2019-02-20: qty 20

## 2019-02-20 MED ORDER — ASPIRIN 81 MG PO CHEW
CHEWABLE_TABLET | ORAL | Status: AC
  Filled 2019-02-20: qty 3

## 2019-02-20 MED ORDER — ASPIRIN EC 325 MG PO TBEC
325.0000 mg | DELAYED_RELEASE_TABLET | ORAL | Status: DC
  Filled 2019-02-20: qty 1

## 2019-02-20 MED ORDER — LACTATED RINGERS IV SOLN
INTRAVENOUS | Status: DC | PRN
  Administered 2019-02-20: 09:00:00 via INTRAVENOUS

## 2019-02-20 MED ORDER — ONDANSETRON HCL 4 MG/2ML IJ SOLN: 4.0000 mg | Freq: Once | INTRAMUSCULAR | Status: DC | PRN

## 2019-02-20 MED ORDER — HEPARIN SODIUM (PORCINE) 1000 UNIT/ML IJ SOLN
INTRAMUSCULAR | Status: DC | PRN
  Administered 2019-02-20: 1000 [IU] via INTRAVENOUS

## 2019-02-20 MED ORDER — FENTANYL CITRATE (PF) 250 MCG/5ML IJ SOLN
INTRAMUSCULAR | Status: DC | PRN
  Administered 2019-02-20: 25 ug via INTRAVENOUS

## 2019-02-20 MED ORDER — CLOPIDOGREL BISULFATE 75 MG PO TABS
75.0000 mg | ORAL_TABLET | ORAL | Status: DC
  Filled 2019-02-20: qty 1

## 2019-02-20 MED ORDER — NIMODIPINE 30 MG PO CAPS
0.0000 mg | ORAL_CAPSULE | ORAL | Status: AC
  Administered 2019-02-20: 07:00:00 60 mg via ORAL
  Filled 2019-02-20: qty 2

## 2019-02-20 MED ORDER — MIDAZOLAM HCL 5 MG/5ML IJ SOLN
INTRAMUSCULAR | Status: DC | PRN
  Administered 2019-02-20: 1 mg via INTRAVENOUS

## 2019-02-20 MED ORDER — HYDRALAZINE HCL 20 MG/ML IJ SOLN
INTRAMUSCULAR | Status: DC | PRN
  Administered 2019-02-20 (×3): 5 mg via INTRAVENOUS

## 2019-02-20 MED ORDER — CLOPIDOGREL BISULFATE 300 MG PO TABS
300.0000 mg | ORAL_TABLET | Freq: Once | ORAL | Status: AC
  Administered 2019-02-20: 300 mg via ORAL
  Filled 2019-02-20: qty 1

## 2019-02-20 MED ORDER — ASPIRIN 81 MG PO CHEW
243.0000 mg | CHEWABLE_TABLET | Freq: Once | ORAL | Status: AC
  Administered 2019-02-20: 243 mg via ORAL

## 2019-02-20 MED ORDER — IOHEXOL 300 MG/ML  SOLN: 54.0000 mL | Freq: Once | INTRAMUSCULAR | Status: DC | PRN

## 2019-02-20 MED ORDER — ONDANSETRON HCL 4 MG/2ML IJ SOLN
INTRAMUSCULAR | Status: DC | PRN
  Administered 2019-02-20: 4 mg via INTRAVENOUS

## 2019-02-20 MED ORDER — SODIUM CHLORIDE 0.9 % IV SOLN: INTRAVENOUS | Status: DC

## 2019-02-20 MED ORDER — FENTANYL CITRATE (PF) 100 MCG/2ML IJ SOLN: 25.0000 ug | INTRAMUSCULAR | Status: DC | PRN

## 2019-02-20 MED ORDER — CEFAZOLIN SODIUM-DEXTROSE 2-4 GM/100ML-% IV SOLN
2.0000 g | INTRAVENOUS | Status: AC
  Administered 2019-02-20: 2 g via INTRAVENOUS
  Filled 2019-02-20: qty 100

## 2019-02-20 NOTE — Discharge Instructions (Signed)
Beginning tomorrow 02/21/2019, patient to start taking Plavix 75 mg once daily and Aspirin 325 mg once daily.   DRINK PLENTY OF FLUIDS FOR THE NEXT 2-3 DAYS TO KEEP HYDRATED.  Cerebral Angiogram, Care After This sheet gives you information about how to care for yourself after your procedure. Your health care provider may also give you more specific instructions. If you have problems or questions, contact your health care provider. What can I expect after the procedure? After your procedure, it is common to have:  Bruising and tenderness at the catheter insertion site.  A mild headache. Follow these instructions at home: Insertion site care   Follow instructions from your health care provider about how to take care of the insertion site. Make sure you: ? Wash your hands with soap and water before you change your bandage (dressing). If soap and water are not available, use hand sanitizer. ? Change your dressing as told by your health care provider.   Do not apply powder or lotion to the site.  Do not take baths, swim, or use a hot tub for 5 days.  You may shower 24-48 hours after the procedure or as told by your health care provider. ? Gently wash the site with plain soap and water. ? Pat the area dry with a clean towel. ? Do not rub the site. This may cause bleeding.  Check your incision area every day for signs of infection. Check for: ? Redness, swelling, or pain. ? Fluid or blood. ? Warmth. ? Pus or a bad smell. Activity  Rest as told by your health care provider.  Do not lift anything that is heavier than 10 lb (4.5 kg), or the limit that your health care provider tells you, until he or she says that it is safe.  Do not drive for 24 hours if you were given a medicine to help you relax (sedative).  Do not drive or use heavy machinery while taking prescription pain medicine. General instructions   Return to your normal activities as told by your health care provider.  Ask your health care provider what activities are safe for you.  If the catheter site starts to bleed, lie flat and put pressure on the site.  Drink enough fluid to keep your urine clear or pale yellow. This helps flush the contrast dye from your body.  Take over-the-counter and prescription medicines only as told by your health care provider.  Keep all follow-up visits as directed by your health care provider. This is important. Contact a health care provider if:  You have a fever or chills.  You have redness, swelling, or more pain around your insertion site.  You have fluid or blood coming from your insertion site.  The insertion site feels warm to the touch.  You have pus or a bad smell coming from your insertion site.  You have more bruising around the insertion site.  Blood collects in the tissue around the catheter site (hematoma), and the hematoma may be painful to the touch. Get help right away if:  You have vision changes or loss of vision.  The catheter insertion area swells very fast.  You have numbness or weakness on one side of your body.  You have trouble talking, or you have slurred speech or cannot speak (aphasia).  You feel confused or have trouble remembering.  You have severe pain at the catheter insertion area.  The catheter insertion area is bleeding, and the bleeding does not stop after  30 minutes of holding steady pressure on the site.  The arm or leg where the catheter was inserted is numb, tingling, or cold.  You have chest pain.  You have trouble breathing.  You have a rash.  You have trouble using the arm or leg where the catheter was inserted. These symptoms may represent a serious problem that is an emergency. Do not wait to see if the symptoms will go away. Get medical help right away. Call your local emergency services (911 in [place] Do not drive yourself to the hospital. Summary  After your procedure, it is common to have bruising  and tenderness at the site where the catheter was inserted.  If your catheter insertion site begins to bleed, put direct pressure on it until the bleeding stops.  After your procedure, it is important to rest and drink plenty of fluids.  Return to your normal activities as told by your health care provider. Ask your health care provider what activities are safe for you. This information is not intended to replace advice given to you by your health care provider. Make sure you discuss any questions you have with your health care provider. Document Released: 12/16/2013 Document Revised: 07/14/2017 Document Reviewed: 09/05/2016 Elsevier Patient Education  2020 [employer].

## 2019-02-20 NOTE — Anesthesia Preprocedure Evaluation (Addendum)
Anesthesia Evaluation  Patient identified by MRN, date of birth, ID band Patient awake    Reviewed: Allergy & Precautions, NPO status , Patient's Chart, lab work & pertinent test results, reviewed documented beta blocker date and time   Airway Mallampati: II  TM Distance: >3 FB Neck ROM: Full    Dental  (+) Dental Advisory Given, Partial Lower, Upper Dentures   Pulmonary former smoker,    Pulmonary exam normal breath sounds clear to auscultation       Cardiovascular hypertension, Pt. on home beta blockers and Pt. on medications Normal cardiovascular exam Rhythm:Regular Rate:Normal     Neuro/Psych  Headaches, PSYCHIATRIC DISORDERS Anxiety Depression  BRAIN ANEURYSM MRI 01/16/2019: IMPRESSION: 1. 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 Patent right M1 stent. 2. Otherwise stable and negative intracranial MRA.    GI/Hepatic Neg liver ROS, GERD  ,  Endo/Other  Hypothyroidism   Renal/GU negative Renal ROS     Musculoskeletal  (+) Arthritis ,   Abdominal   Peds  Hematology  (+) Blood dyscrasia (Plavix), ,   Anesthesia Other Findings Day of surgery medications reviewed with the patient.  Reproductive/Obstetrics                            Anesthesia Physical Anesthesia Plan  ASA: III  Anesthesia Plan: General   Post-op Pain Management:    Induction: Intravenous  PONV Risk Score and Plan: 2 and Dexamethasone and Ondansetron  Airway Management Planned: Oral ETT  Additional Equipment: Arterial line  Intra-op Plan:   Post-operative Plan: Extubation in OR  Informed Consent: I have reviewed the patients History and Physical, chart, labs and discussed the procedure including the risks, benefits and alternatives for the proposed anesthesia with the patient or authorized  representative who has indicated his/her understanding and acceptance.     Dental advisory given  Plan Discussed with: CRNA  Anesthesia Plan Comments:         Anesthesia Quick Evaluation

## 2019-02-20 NOTE — Sedation Documentation (Signed)
Patient under the care of Anesthesia

## 2019-02-20 NOTE — H&P (Signed)
Chief Complaint: Patient was seen in consultation today for cerebral arteriogram with possible R MCA aneurysm embolization   Referring Physician(s): Dr Rondel Oh  Supervising Physician: Luanne Bras  Patient Status: [place]  History of Present Illness: Angel Keller. is a 78 y.o. male   Known to [place] temporal branch embolization with LVIS Jr stent 10/2015 Follows with Dr Estanislado Pandy  MRA head 01/15/2019: 1. 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 Patent right M1 stent. 2. Otherwise stable and negative intracranial MRA.  Consulted with Dr Estanislado Pandy 02/05/19 Decision made to return for an image-guided cerebral arteriogram with intent to treat right MCA aneurysm  Scheduled for this today  Past Medical History:  Diagnosis Date  . Aneurysm ([place])   . Anxiety   . Arthritis   . Depression   . Headache   . HLD (hyperlipidemia)   . Hx of inguinal hernia repair   . Hypertension   . Hypothyroidism   . Overactive bladder   . Pneumonia   . Shingles   . Thyroid disease     Past Surgical History:  Procedure Laterality Date  . BILATERAL CARPAL TUNNEL RELEASE    . CATARACT EXTRACTION, BILATERAL Bilateral   . ELBOW SURGERY Bilateral   . INGUINAL HERNIA REPAIR Right 1951  . IR GENERIC HISTORICAL  10/28/2016   IR RADIOLOGIST EVAL & MGMT 10/28/2016 [facility]INTER[facility] RAD  . RADIOLOGY WITH ANESTHESIA N/A 10/21/2015   Procedure: RADIOLOGY WITH ANESTHESIA;  Surgeon: Luanne Bras, MD;  Location: [facility];  Service: Radiology;  Laterality: N/A;  . ROTATOR CUFF REPAIR Bilateral   . VARICOSE VEIN SURGERY      Allergies: Patient has no known allergies.  Medications: Prior to Admission medications   Medication Sig Start Date End Date Taking? Authorizing Provider  aspirin EC 81 MG tablet Take 81 mg by mouth daily.     [provider]   bisoprolol-hydrochlorothiazide (ZIAC) 2.5-6.25 MG tablet Take 1 tablet by mouth daily. 06/20/15   [provider]  Cholecalciferol (D3 ADULT PO) Take 2,000 Units by mouth daily.    [provider]  clopidogrel (PLAVIX) 75 MG tablet Take 37.5 mg by mouth every other day.     [provider]  Cyanocobalamin (B-12 PO) Take 2,000 mcg by mouth daily.     [provider]  escitalopram (LEXAPRO) 20 MG tablet Take 20 mg by mouth daily. 12/22/18   [provider]  levothyroxine (SYNTHROID) 100 MCG tablet Take 100 mcg by mouth daily before breakfast.  09/07/15   [provider]  lovastatin (MEVACOR) 20 MG tablet Take 20 mg by mouth daily. 07/25/15   [provider]     Family History  Problem Relation Age of Onset  . Breast cancer Mother   . Lung cancer Father   . Alcoholism Sister   . Cirrhosis Sister   . Colon cancer Neg Hx   . Esophageal cancer Neg Hx   . Liver cancer Neg Hx   . Pancreatic cancer Neg Hx   . Prostate cancer Neg Hx     Social History   Socioeconomic History  . Marital status: Widowed    Spouse name: Not on file  . Number of children: 5  . Years of education: Not on file  . Highest education level: Not on file  Occupational History  . Occupation: retired  Scientific laboratory technician  . Financial  resource strain: Not on file  . Food insecurity    Worry: Not on file    Inability: Not on file  . Transportation needs    Medical: Not on file    Non-medical: Not on file  Tobacco Use  . Smoking status: Former Smoker    Packs/day: 0.25    Years: 1.00    Pack years: 0.25    Quit date: 08/15/1968    Years since quitting: 50.5  . Smokeless tobacco: Never Used  Substance and Sexual Activity  . Alcohol use: No  . Drug use: No  . Sexual activity: Not on file  Lifestyle  . Physical activity    Days per week: Not on file    Minutes per session: Not on file  . Stress: Not on file  Relationships  . Social Herbalist  on phone: Not on file    Gets together: Not on file    Attends religious service: Not on file    Active member of club or organization: Not on file    Attends meetings of clubs or organizations: Not on file    Relationship status: Not on file  Other Topics Concern  . Not on file  Social History Narrative  . Not on file    Review of Systems: A 12 point ROS discussed and pertinent positives are indicated in the HPI above.  All other systems are negative.  Review of Systems  Constitutional: Negative for activity change, appetite change, fatigue and fever.  HENT: Negative for tinnitus and trouble swallowing.   Eyes: Negative for visual disturbance.  Respiratory: Negative for cough and shortness of breath.   Cardiovascular: Negative for chest pain.  Gastrointestinal: Negative for abdominal pain.  Musculoskeletal: Negative for back pain and gait problem.  Neurological: Negative for dizziness, tremors, seizures, syncope, facial asymmetry, speech difficulty, weakness, light-headedness, numbness and headaches.  Psychiatric/Behavioral: Negative for behavioral problems, confusion and decreased concentration.    Vital Signs: There were no vitals taken for this visit.  Physical Exam Vitals signs reviewed.  Constitutional:      Appearance: Normal appearance.  HENT:     Head: Atraumatic.     Mouth/Throat:     Mouth: Mucous membranes are moist.  Eyes:     Extraocular Movements: Extraocular movements intact.  Cardiovascular:     Rate and Rhythm: Normal rate and regular rhythm.     Heart sounds: Normal heart sounds.  Pulmonary:     Effort: Pulmonary effort is normal.     Breath sounds: Normal breath sounds.  Abdominal:     Palpations: Abdomen is soft.     Tenderness: There is no abdominal tenderness.  Musculoskeletal: Normal range of motion.     Right lower leg: No edema.     Left lower leg: No edema.  Skin:    General: Skin is warm and dry.  Neurological:     Mental Status: He is  alert. He is disoriented.  Psychiatric:        Mood and Affect: Mood normal.        Behavior: Behavior normal.        Thought Content: Thought content normal.        Judgment: Judgment normal.     Imaging: No results found.  Labs:  CBC: Recent Labs    06/29/18 1417 02/20/19 0608  WBC 6.4 4.9  HGB 14.1 12.7*  HCT 41.5 39.5  PLT 219.0 172    COAGS: Recent Labs  02/20/19 0608  INR 1.1  APTT 29    BMP: Recent Labs    06/29/18 1417 01/01/19 1504  NA 139  --   K 4.3  --   CL 100  --   CO2 30  --   GLUCOSE 91  --   BUN 16 17  CALCIUM 9.7  --   CREATININE 1.30 1.21    LIVER FUNCTION TESTS: Recent Labs    06/29/18 1417  BILITOT 0.7  AST 21  ALT 12  ALKPHOS 68  PROT 7.4  ALBUMIN 4.4    TUMOR MARKERS: No results for input(s): AFPTM, CEA, CA199, CHROMGRNA in the last 8760 hours.  Assessment and Plan:  Right middle cerebral artery aneurysm First treated LVIS Jr stent 10/2015 Follow up imaging now revealing enlargement of aneurysm Scheduled now for cerebral arteriogram with R MCA aneurysm embolization Risks and benefits of cerebral angiogram with intervention were discussed with the patient including, but not limited to bleeding, infection, vascular injury, contrast induced renal failure, stroke or even death.  This interventional procedure involves the use of X-rays and because of the nature of the planned procedure, it is possible that we will have prolonged use of X-ray fluoroscopy.  Potential radiation risks to you include (but are not limited to) the following: - A slightly elevated risk for cancer  several years later in life. This risk is typically less than 0.5% percent. This risk is low in comparison to the normal incidence of human cancer, which is 33% for women and 50% for men according to the [employer]. - Radiation induced injury can include skin redness, resembling a rash, tissue breakdown / ulcers and hair loss (which can be  temporary or permanent).   The likelihood of either of these occurring depends on the difficulty of the procedure and whether you are sensitive to radiation due to previous procedures, disease, or genetic conditions.   IF your procedure requires a prolonged use of radiation, you will be notified and given written instructions for further action.  It is your responsibility to monitor the irradiated area for the 2 weeks following the procedure and to notify your physician if you are concerned that you have suffered a radiation induced injury.    All of the patient's questions were answered, patient is agreeable to proceed.  Consent signed and in chart. Pt is aware- if intervention is performed-- he will be admitted overnight for observation into Neuro ICU. Agreeable to proceed  Thank you for this interesting consult.  I greatly enjoyed meeting VASHAWN EKSTEIN Sr. and look forward to participating in their care.  A copy of this report was sent to the requesting provider on this date.  Electronically Signed: Lavonia Drafts, PA-C 02/20/2019, 7:30 AM   I spent a total of    25 Minutes in face to face in clinical consultation, greater than 50% of which was counseling/coordinating care for cerebral arteriogram with possible intervention

## 2019-02-20 NOTE — Progress Notes (Signed)
NIR.  Patient scheduled for an image-guided diagnostic cerebral arteriogram with possible staged embolization of right MCA aneurysm tentatively for this AM with Dr. Estanislado Pandy.  P2Y12 256 PRU this AM. Patient reports taking Plavix 37.5 mg once every other day (although chart records state patient should be taking Plavix 75 mg once daily). Case discussed with Dr. Estanislado Pandy who recommends loading patient with Plavix 300 mg x1 and proceed with image-guided diagnostic cerebral arteriogram this AM- will proceed with staged embolization based on findings, but probable that patient will be rescheduled due to P2Y12 results.  Beginning tomorrow 02/21/2019, patient to start taking Plavix 75 mg once daily and Aspirin 325 mg once daily.  Bea Graff Sally-Anne Wamble, PA-C 02/20/2019, 8:41 AM

## 2019-02-20 NOTE — Procedures (Signed)
S/P bilateral common carotid arteriograms . RT CFA approach . Findings  Approx 2,2 mm x 1.75mm  RT MCA ant temp region aneurysm. Widely patent stent in MCA M1

## 2019-02-20 NOTE — H&P (Deleted)
  The note originally documented on this encounter has been moved the the encounter in which it belongs.  

## 2019-02-20 NOTE — Transfer of Care (Signed)
Immediate Anesthesia Transfer of Care Note  Patient: Angel SHELDON Sr.  Procedure(s) Performed: [employer] (N/A )  Patient Location: PACU  Anesthesia Type:MAC  Level of Consciousness: awake  Airway & Oxygen Therapy: Patient Spontanous Breathing  Post-op Assessment: Report given to RN and Post -op Vital signs reviewed and stable  Post vital signs: Reviewed and stable  Last Vitals:  Vitals Value Taken Time  BP 155/78 02/20/19 1011  Temp    Pulse 63 02/20/19 1014  Resp 17 02/20/19 1014  SpO2 98 % 02/20/19 1014  Vitals shown include unvalidated device data.  Last Pain:  Vitals:   02/20/19 0650  TempSrc:   PainSc: 5       Patients Stated Pain Goal: 3 (17/98/10 2548)  Complications: No apparent anesthesia complications

## 2019-02-20 NOTE — Anesthesia Procedure Notes (Signed)
Arterial Line Insertion Start/End7/03/2019 7:55 AM, 02/20/2019 8:10 AM Performed by: Catalina Gravel, MD, Wilburn Cornelia, CRNA, CRNA  Patient location: Pre-op. Preanesthetic checklist: patient identified, IV checked, site marked, risks and benefits discussed, surgical consent, monitors and equipment checked, pre-op evaluation, timeout performed and anesthesia consent Lidocaine 1% used for infiltration radial was placed Catheter size: 20 G Hand hygiene performed , maximum sterile barriers used  and Seldinger technique used  Attempts: 2 Procedure performed without using ultrasound guided technique. Following insertion, dressing applied. Post procedure assessment: normal and unchanged  Patient tolerated the procedure well with no immediate complications.

## 2019-02-20 NOTE — Anesthesia Postprocedure Evaluation (Signed)
Anesthesia Post Note  Patient: Angel Keller.  Procedure(s) Performed: [employer] (N/A )     Patient location during evaluation: PACU Anesthesia Type: MAC Level of consciousness: awake and alert Pain management: pain level controlled Vital Signs Assessment: post-procedure vital signs reviewed and stable Respiratory status: spontaneous breathing, nonlabored ventilation and respiratory function stable Cardiovascular status: stable and blood pressure returned to baseline Postop Assessment: no apparent nausea or vomiting Anesthetic complications: no    Last Vitals:  Vitals:   02/20/19 1130 02/20/19 1145  BP:    Pulse: (!) 52 (!) 54  Resp: 18 (!) 23  Temp:    SpO2: 97% 97%    Last Pain:  Vitals:   02/20/19 1145  TempSrc:   PainSc: 0-No pain                 Catalina Gravel

## 2019-02-21 ENCOUNTER — Encounter (HOSPITAL_COMMUNITY): Payer: Self-pay | Admitting: Interventional Radiology

## 2019-02-25 ENCOUNTER — Encounter (HOSPITAL_COMMUNITY): Payer: Self-pay | Admitting: Interventional Radiology

## 2019-03-04 ENCOUNTER — Telehealth: Payer: Self-pay | Admitting: Student

## 2019-03-04 NOTE — Telephone Encounter (Signed)
NIR.  Patient with history of right MCA temporal branch aneurysm s/p revascularization using a LVIS Jr device 10/21/2018 by Dr. Estanislado Pandy. MRA demonstrated mildly increased size of aneurysm. Patient then underwent an image-guided diagnostic cerebral arteriogram 02/20/2019 by Dr. Estanislado Pandy, results showed stent patent and stable with aneurysm decreased in size.  Called patient's daughter, Lattie Haw [phone]), at 1410 and reviewed above. Explained that based on findings of diagnostic cerebral arteriogram, Dr. Estanislado Pandy is recommending continued conservative management for aneurysm. Recommend that patient discontinue Plavix/Aspirin 325 mg use and begin taking Aspirin 81 mg once daily. Explained that we will monitor aneurysm with MRI/MRA brain/head (without contrast) 6 months from procedure 02/20/2019. Informed daughter that our schedulers will call her to set up this scan. Lattie Haw states that she will call her father (patient) and instruct on above.  All questions answered and concerns addressed. Daughter conveys understanding and agrees with plan. Please call NIR with questions/concerns.   Bea Graff Xoe Hoe, PA-C 03/04/2019, 2:20 PM

## 2019-09-09 ENCOUNTER — Telehealth (HOSPITAL_COMMUNITY): Payer: Self-pay

## 2019-09-09 NOTE — Telephone Encounter (Signed)
Called daughter to schedule f/u consult, no answer, left vm. AW

## 2019-09-10 ENCOUNTER — Other Ambulatory Visit (HOSPITAL_COMMUNITY): Payer: Self-pay | Admitting: Interventional Radiology

## 2019-09-10 DIAGNOSIS — I729 Aneurysm of unspecified site: Secondary | ICD-10-CM

## 2019-09-19 ENCOUNTER — Ambulatory Visit (HOSPITAL_COMMUNITY)
Admission: RE | Admit: 2019-09-19 | Discharge: 2019-09-19 | Disposition: A | Discharge status: Home / Self Care | Payer: Medicare HMO | Source: Ambulatory Visit | Attending: Interventional Radiology | Admitting: Interventional Radiology

## 2019-09-19 ENCOUNTER — Other Ambulatory Visit: Payer: Self-pay

## 2019-09-19 DIAGNOSIS — I729 Aneurysm of unspecified site: Secondary | ICD-10-CM

## 2019-09-19 NOTE — H&P (Signed)
Chief Complaint: Patient was seen in consultation today for hx of (R) middle cerebral aneurysm  Supervising Physician: Luanne Bras  Patient Status: [facility] - Out-pt  History of Present Illness: Angel Keller. is a 79 y.o. 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 He is here for follow up Last imaging was a diagnostic angiogram on 02/25/19, showing stability of the aneurysm, even a slight decrease in size. We discontinued his Plavix but he remains on ASA 81mg  daily. He reports some chronic posterior headaches that he mainly associates with neck pain, for which he sees an orthopedist. He's very active still. Denies any other neurologic sxs.   Past Medical History:  Diagnosis Date  . Aneurysm ([place])   . Anxiety   . Arthritis   . Depression   . Headache   . HLD (hyperlipidemia)   . Hx of inguinal hernia repair   . Hypertension   . Hypothyroidism   . Overactive bladder   . Pneumonia   . Shingles   . Thyroid disease     Past Surgical History:  Procedure Laterality Date  . BILATERAL CARPAL TUNNEL RELEASE    . CATARACT EXTRACTION, BILATERAL Bilateral   . ELBOW SURGERY Bilateral   . INGUINAL HERNIA REPAIR Right 1951  . IR ANGIO INTRA EXTRACRAN SEL COM CAROTID INNOMINATE BILAT MOD SED  02/20/2019  . IR GENERIC HISTORICAL  10/28/2016   IR RADIOLOGIST EVAL & MGMT 10/28/2016 [facility] RAD  . RADIOLOGY WITH ANESTHESIA N/A 10/21/2015   Procedure: RADIOLOGY WITH ANESTHESIA;  Surgeon: Luanne Bras, MD;  Location: [place];  Service: Radiology;  Laterality: N/A;  . RADIOLOGY WITH ANESTHESIA N/A 02/20/2019   Procedure: [employer];  Surgeon: Luanne Bras, MD;  Location: [place];  Service: Radiology;  Laterality: N/A;  . ROTATOR CUFF REPAIR Bilateral   . VARICOSE VEIN SURGERY      Allergies: Patient has no known allergies.  Medications: Prior to Admission medications   Medication Sig Start Date End Date Taking? Authorizing Provider    aspirin EC 81 MG tablet Take 81 mg by mouth daily.     [provider]  bisoprolol-hydrochlorothiazide (ZIAC) 2.5-6.25 MG tablet Take 1 tablet by mouth daily. 06/20/15   [provider]  Cholecalciferol (D3 ADULT PO) Take 2,000 Units by mouth daily.    [provider]  Cyanocobalamin (B-12 PO) Take 2,000 mcg by mouth daily.     [provider]  escitalopram (LEXAPRO) 20 MG tablet Take 20 mg by mouth daily. 12/22/18   [provider]  levothyroxine (SYNTHROID) 100 MCG tablet Take 100 mcg by mouth daily before breakfast.  09/07/15   [provider]  lovastatin (MEVACOR) 20 MG tablet Take 20 mg by mouth daily. 07/25/15   [provider]     Family History  Problem Relation Age of Onset  . Breast cancer Mother   . Lung cancer Father   . Alcoholism Sister   . Cirrhosis Sister   . Colon cancer Neg Hx   . Esophageal cancer Neg Hx   . Liver cancer Neg Hx   . Pancreatic cancer Neg Hx   . Prostate cancer Neg Hx     Social History   Socioeconomic History  . Marital status: Widowed    Spouse name: Not on file  . Number of children: 5  . Years of education: Not on file  . Highest education level: Not on file  Occupational History  . Occupation: retired  Tobacco Use  .  Smoking status: Former Smoker    Packs/day: 0.25    Years: 1.00    Pack years: 0.25    Quit date: 08/15/1968    Years since quitting: 51.1  . Smokeless tobacco: Never Used  Substance and Sexual Activity  . Alcohol use: No  . Drug use: No  . Sexual activity: Not on file  Other Topics Concern  . Not on file  Social History Narrative  . Not on file   Social Determinants of Health   Financial Resource Strain:   . Difficulty of Paying Living Expenses: Not on file  Food Insecurity:   . Worried About Charity fundraiser in the Last Year: Not on file  . Ran Out of Food in the Last Year: Not on file  Transportation Needs:   . Lack of Transportation (Medical):  Not on file  . Lack of Transportation (Non-Medical): Not on file  Physical Activity:   . Days of Exercise per Week: Not on file  . Minutes of Exercise per Session: Not on file  Stress:   . Feeling of Stress : Not on file  Social Connections:   . Frequency of Communication with Friends and Family: Not on file  . Frequency of Social Gatherings with Friends and Family: Not on file  . Attends Religious Services: Not on file  . Active Member of Clubs or Organizations: Not on file  . Attends Archivist Meetings: Not on file  . Marital Status: Not on file    Review of Systems: A 12 point ROS discussed and pertinent positives are indicated in the HPI above.  All other systems are negative.  Review of Systems  Vital Signs: There were no vitals taken for this visit.  Physical Exam Constitutional:      Appearance: Normal appearance. He is not ill-appearing.  HENT:     Mouth/Throat:     Mouth: Mucous membranes are moist.     Pharynx: Oropharynx is clear.  Cardiovascular:     Rate and Rhythm: Normal rate and regular rhythm.     Heart sounds: Normal heart sounds.  Pulmonary:     Effort: Pulmonary effort is normal. No respiratory distress.     Breath sounds: Normal breath sounds.  Skin:    General: Skin is warm and dry.  Neurological:     General: No focal deficit present.     Mental Status: He is alert and oriented to person, place, and time. Mental status is at baseline.     Cranial Nerves: No cranial nerve deficit.     Motor: No weakness.     Coordination: Coordination normal.     Gait: Gait normal.  Psychiatric:        Mood and Affect: Mood normal.        Thought Content: Thought content normal.     Imaging: No results found.  Labs:  CBC: Recent Labs    02/20/19 0608  WBC 4.9  HGB 12.7*  HCT 39.5  PLT 172    COAGS: Recent Labs    02/20/19 0608  INR 1.1  APTT 29    BMP: Recent Labs    01/01/19 1504 02/20/19 0608  NA  --  136  K  --  3.7    CL  --  101  CO2  --  24  GLUCOSE  --  107*  BUN 17 17  CALCIUM  --  9.2  CREATININE 1.21 1.41*  GFRNONAA  --  47*  GFRAA  --  19*    Assessment and Plan: Hx of (R)MCA aneurysm s/p prior endovascular stent treatment Last diagnostic angio in July was stable. He will continue his ASA 81mg  daily. Will set up MRI/MRA for follow up.   Thank you for this interesting consult.  I greatly enjoyed meeting MANARD GAILES Sr. and look forward to participating in their care.  A copy of this report was sent to the requesting provider on this date.  Electronically Signed: Ascencion Dike, PA-C 09/19/2019, 11:24 AM   I spent a total of 25 minutes in face to face in clinical consultation, greater than 50% of which was counseling/coordinating care for follow up MCA aneurysm

## 2019-10-01 ENCOUNTER — Telehealth (HOSPITAL_COMMUNITY): Payer: Self-pay

## 2019-10-01 ENCOUNTER — Other Ambulatory Visit (HOSPITAL_COMMUNITY): Payer: Self-pay | Admitting: Interventional Radiology

## 2019-10-01 DIAGNOSIS — I671 Cerebral aneurysm, nonruptured: Secondary | ICD-10-CM

## 2019-10-01 NOTE — Telephone Encounter (Signed)
Called daughter to schedule mri, no answer, left vm. AW

## 2019-11-14 ENCOUNTER — Ambulatory Visit (HOSPITAL_COMMUNITY): Admission: RE | Admit: 2019-11-14 | Payer: Medicare HMO | Source: Ambulatory Visit

## 2019-11-14 ENCOUNTER — Other Ambulatory Visit: Payer: Self-pay

## 2019-11-14 ENCOUNTER — Ambulatory Visit (HOSPITAL_COMMUNITY)
Admission: RE | Admit: 2019-11-14 | Discharge: 2019-11-14 | Disposition: A | Discharge status: Home / Self Care | Payer: Medicare HMO | Source: Ambulatory Visit | Attending: Interventional Radiology | Admitting: Interventional Radiology

## 2019-11-14 DIAGNOSIS — I671 Cerebral aneurysm, nonruptured: Secondary | ICD-10-CM | POA: Insufficient documentation

## 2019-11-18 ENCOUNTER — Telehealth (HOSPITAL_COMMUNITY): Payer: Self-pay

## 2019-11-18 NOTE — Telephone Encounter (Signed)
Pt agreed to f/u in 6 months with mra head wo. AW  

## 2019-12-11 ENCOUNTER — Other Ambulatory Visit: Payer: Self-pay | Admitting: Family Medicine

## 2019-12-11 ENCOUNTER — Ambulatory Visit
Admission: RE | Admit: 2019-12-11 | Discharge: 2019-12-11 | Disposition: A | Discharge status: Home / Self Care | Payer: Medicare HMO | Source: Ambulatory Visit | Attending: Family Medicine | Admitting: Family Medicine

## 2019-12-11 DIAGNOSIS — M25571 Pain in right ankle and joints of right foot: Secondary | ICD-10-CM

## 2019-12-11 DIAGNOSIS — M79671 Pain in right foot: Secondary | ICD-10-CM

## 2019-12-11 DIAGNOSIS — M25561 Pain in right knee: Secondary | ICD-10-CM

## 2020-01-19 ENCOUNTER — Other Ambulatory Visit: Payer: Self-pay

## 2020-01-19 ENCOUNTER — Encounter (HOSPITAL_COMMUNITY): Payer: Self-pay | Admitting: Emergency Medicine

## 2020-01-19 ENCOUNTER — Emergency Department (HOSPITAL_COMMUNITY)
Admission: EM | Admit: 2020-01-19 | Discharge: 2020-01-20 | Disposition: A | Discharge status: Home / Self Care | Payer: Medicare HMO | Attending: Emergency Medicine | Admitting: Emergency Medicine

## 2020-01-19 DIAGNOSIS — M25551 Pain in right hip: Secondary | ICD-10-CM | POA: Insufficient documentation

## 2020-01-19 DIAGNOSIS — Z7901 Long term (current) use of anticoagulants: Secondary | ICD-10-CM | POA: Insufficient documentation

## 2020-01-19 DIAGNOSIS — Z7982 Long term (current) use of aspirin: Secondary | ICD-10-CM | POA: Insufficient documentation

## 2020-01-19 DIAGNOSIS — Z87891 Personal history of nicotine dependence: Secondary | ICD-10-CM | POA: Insufficient documentation

## 2020-01-19 DIAGNOSIS — I10 Essential (primary) hypertension: Secondary | ICD-10-CM | POA: Insufficient documentation

## 2020-01-19 DIAGNOSIS — Z79899 Other long term (current) drug therapy: Secondary | ICD-10-CM | POA: Diagnosis not present

## 2020-01-19 DIAGNOSIS — M545 Low back pain: Secondary | ICD-10-CM | POA: Diagnosis present

## 2020-01-19 DIAGNOSIS — E079 Disorder of thyroid, unspecified: Secondary | ICD-10-CM | POA: Diagnosis not present

## 2020-01-19 DIAGNOSIS — M5431 Sciatica, right side: Secondary | ICD-10-CM

## 2020-01-19 DIAGNOSIS — M5441 Lumbago with sciatica, right side: Secondary | ICD-10-CM | POA: Insufficient documentation

## 2020-01-19 MED ORDER — TRAMADOL HCL 50 MG PO TABS
50.0000 mg | ORAL_TABLET | Freq: Once | ORAL | Status: AC
  Administered 2020-01-19: 50 mg via ORAL
  Filled 2020-01-19: qty 1

## 2020-01-19 MED ORDER — PREDNISONE 20 MG PO TABS
ORAL_TABLET | ORAL | 0 refills | Status: DC
Start: 2020-01-19 — End: 2020-07-27

## 2020-01-19 MED ORDER — PREDNISONE 20 MG PO TABS
60.0000 mg | ORAL_TABLET | Freq: Once | ORAL | Status: AC
  Administered 2020-01-19: 60 mg via ORAL
  Filled 2020-01-19: qty 3

## 2020-01-19 MED ORDER — TRAMADOL HCL 50 MG PO TABS: 50.0000 mg | ORAL_TABLET | Freq: Four times a day (QID) | ORAL | 0 refills | Status: DC | PRN

## 2020-01-19 NOTE — Discharge Instructions (Signed)
Take prednisone as prescribed.  You likely have a pinched nerve in your back.  Take Tylenol for pain and Ultram for severe pain  See your doctor and you likely need some physical therapy.  Return to ER if you have worse back pain, trouble walking, numbness, weakness, trouble urinating.

## 2020-01-19 NOTE — ED Provider Notes (Signed)
[place] EMERGENCY DEPARTMENT Provider Note   CSN: [id number] Arrival date & time: 01/19/20  1351     History Chief Complaint  Patient presents with  . Hip Pain  . Groin Pain    Burke Terry. is a 79 y.o. male hx of cerebral aneurysm, hyperlipidemia, hypertension, here presenting with back pain and right hip pain.  Patient states that he was moving heavy furniture.  He states that he has been having gradual onset of right-sided back pain radiating to the right hip the last 2 weeks.  Patient did not take any meds prior to arrival.  Patient denies trouble urinating or trouble walking.  Denies any weakness or numbness.  Denies any trauma or injury.  The history is provided by the patient.       Past Medical History:  Diagnosis Date  . Aneurysm ([place])   . Anxiety   . Arthritis   . Depression   . Headache   . HLD (hyperlipidemia)   . Hx of inguinal hernia repair   . Hypertension   . Hypothyroidism   . Overactive bladder   . Pneumonia   . Shingles   . Thyroid disease     Patient Active Problem List   Diagnosis Date Noted  . 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 HYPOTHYROIDISM 07/15/2009  . FATIGUE 07/15/2009  . SORE THROAT 06/11/2009  . ALLERGIC RHINITIS 05/25/2009  . HOARSENESS 05/25/2009  . HYPERTHYROIDISM 11/07/2008  . HYPERTENSION 11/07/2008  . DIVERTICULOSIS, COLON 11/07/2008  . HYPERLIPIDEMIA 10/04/2007  . ANXIETY 10/04/2007  . DEPRESSION 10/04/2007  . GERD 10/04/2007  . COLONIC POLYPS, HX OF 10/04/2007    Past Surgical History:  Procedure Laterality Date  . BILATERAL CARPAL TUNNEL RELEASE    . CATARACT EXTRACTION, BILATERAL Bilateral   . ELBOW SURGERY Bilateral   . INGUINAL HERNIA REPAIR Right 1951  . IR ANGIO INTRA EXTRACRAN SEL COM CAROTID INNOMINATE BILAT MOD SED  02/20/2019  . IR GENERIC HISTORICAL  10/28/2016   IR RADIOLOGIST EVAL & MGMT 10/28/2016 [facility]  . RADIOLOGY WITH  ANESTHESIA N/A 10/21/2015   Procedure: RADIOLOGY WITH ANESTHESIA;  Surgeon: Luanne Bras, MD;  Location: [place];  Service: Radiology;  Laterality: N/A;  . RADIOLOGY WITH ANESTHESIA N/A 02/20/2019   Procedure: [employer];  Surgeon: Luanne Bras, MD;  Location: [place];  Service: Radiology;  Laterality: N/A;  . ROTATOR CUFF REPAIR Bilateral   . VARICOSE VEIN SURGERY         Family History  Problem Relation Age of Onset  . Breast cancer Mother   . Lung cancer Father   . Alcoholism Sister   . Cirrhosis Sister   . Colon cancer Neg Hx   . Esophageal cancer Neg Hx   . Liver cancer Neg Hx   . Pancreatic cancer Neg Hx   . Prostate cancer Neg Hx     Social History   Tobacco Use  . Smoking status: Former Smoker    Packs/day: 0.25    Years: 1.00    Pack years: 0.25    Quit date: 08/15/1968    Years since quitting: 51.4  . Smokeless tobacco: Never Used  Substance Use Topics  . Alcohol use: No  . Drug use: No    Home Medications Prior to Admission medications   Medication Sig Start Date End Date Taking? Authorizing Provider  aspirin EC 81 MG tablet Take 81 mg by mouth daily.  [provider]  bisoprolol-hydrochlorothiazide (ZIAC) 2.5-6.25 MG tablet Take 1 tablet by mouth daily. 06/20/15   [provider]  Cholecalciferol (D3 ADULT PO) Take 2,000 Units by mouth daily.    [provider]  clopidogrel (PLAVIX) 75 MG tablet Take 37.5 mg by mouth every other day.     [provider]  Cyanocobalamin (B-12 PO) Take 2,000 mcg by mouth daily.     [provider]  escitalopram (LEXAPRO) 20 MG tablet Take 20 mg by mouth daily. 12/22/18   [provider]  levothyroxine (SYNTHROID) 100 MCG tablet Take 100 mcg by mouth daily before breakfast.  09/07/15   [provider]  lovastatin (MEVACOR) 20 MG tablet Take 20 mg by mouth daily. 07/25/15   [provider]    Allergies    Patient has no known allergies.  Review of  Systems   Review of Systems  Musculoskeletal: Positive for back pain.  All other systems reviewed and are negative.   Physical Exam Updated Vital Signs BP 131/75 (BP Location: Right Arm)   Pulse (!) 51   Temp 98.8 F (37.1 C) (Oral)   Resp 14   SpO2 96%   Physical Exam Vitals and nursing note reviewed.  Constitutional:      Comments: Slightly uncomfortable   HENT:     Head: Normocephalic.     Nose: Nose normal.     Mouth/Throat:     Mouth: Mucous membranes are moist.  Eyes:     Extraocular Movements: Extraocular movements intact.     Pupils: Pupils are equal, round, and reactive to light.  Cardiovascular:     Rate and Rhythm: Normal rate and regular rhythm.     Pulses: Normal pulses.     Heart sounds: Normal heart sounds.  Pulmonary:     Effort: Pulmonary effort is normal.     Breath sounds: Normal breath sounds.  Abdominal:     General: Abdomen is flat.     Palpations: Abdomen is soft.  Musculoskeletal:     Cervical back: Normal range of motion.     Comments: Right SI joint versus right paralumbar tenderness.  No saddle anesthesia.  Positive straight leg raise on the right.  Skin:    General: Skin is warm.     Capillary Refill: Capillary refill takes less than 2 seconds.  Neurological:     General: No focal deficit present.     Mental Status: He is alert and oriented to person, place, and time.     Comments: No saddle anesthesia patient has normal strength and sensation bilateral lower extremities.  Patient has normal reflexes bilateral legs.   Psychiatric:        Mood and Affect: Mood normal.        Behavior: Behavior normal.     ED Results / Procedures / Treatments   Labs (all labs ordered are listed, but only abnormal results are displayed) Labs Reviewed - No data to display  EKG None  Radiology No results found.  Procedures Procedures (including critical care time)  Medications Ordered in ED Medications  traMADol (ULTRAM) tablet 50 mg (has no  administration in time range)  predniSONE (DELTASONE) tablet 60 mg (has no administration in time range)    ED Course  I have reviewed the triage vital signs and the nursing notes.  Pertinent labs & imaging results that were available during my care of the patient were reviewed by me and considered in my medical decision making (see chart  for details).    MDM Rules/Calculators/A&P                      JCION BUDDENHAGEN Sr. is a 79 y.o. male here with back pain.  Likely radiculopathy versus SI joint pain.  Patient has no trauma or injury and has no saddle anesthesia.  Will discharge home with a course of steroids and tramadol for pain.  Also recommend physical therapy with his doctor.  Final Clinical Impression(s) / ED Diagnoses Final diagnoses:  None    Rx / DC Orders ED Discharge Orders    None       Drenda Freeze, MD 01/19/20 1624

## 2020-01-19 NOTE — ED Triage Notes (Signed)
C/o R hip and R groin pain x 2 weeks. States pain started after moving heavy furniture.  Denies urinary complaints.

## 2020-02-10 ENCOUNTER — Emergency Department (HOSPITAL_COMMUNITY)
Admission: EM | Admit: 2020-02-10 | Discharge: 2020-02-10 | Disposition: A | Discharge status: Home / Self Care | Payer: Medicare HMO | Attending: Emergency Medicine | Admitting: Emergency Medicine

## 2020-02-10 ENCOUNTER — Emergency Department (HOSPITAL_COMMUNITY): Payer: Medicare HMO

## 2020-02-10 ENCOUNTER — Encounter (HOSPITAL_COMMUNITY): Payer: Self-pay | Admitting: Emergency Medicine

## 2020-02-10 DIAGNOSIS — Z79899 Other long term (current) drug therapy: Secondary | ICD-10-CM | POA: Diagnosis not present

## 2020-02-10 DIAGNOSIS — G44319 Acute post-traumatic headache, not intractable: Secondary | ICD-10-CM | POA: Diagnosis not present

## 2020-02-10 DIAGNOSIS — I10 Essential (primary) hypertension: Secondary | ICD-10-CM

## 2020-02-10 DIAGNOSIS — Y92410 Unspecified street and highway as the place of occurrence of the external cause: Secondary | ICD-10-CM | POA: Diagnosis not present

## 2020-02-10 DIAGNOSIS — S8002XA Contusion of left knee, initial encounter: Secondary | ICD-10-CM | POA: Diagnosis not present

## 2020-02-10 DIAGNOSIS — E039 Hypothyroidism, unspecified: Secondary | ICD-10-CM | POA: Diagnosis not present

## 2020-02-10 DIAGNOSIS — Y999 Unspecified external cause status: Secondary | ICD-10-CM | POA: Insufficient documentation

## 2020-02-10 DIAGNOSIS — Y9389 Activity, other specified: Secondary | ICD-10-CM | POA: Insufficient documentation

## 2020-02-10 DIAGNOSIS — Z7902 Long term (current) use of antithrombotics/antiplatelets: Secondary | ICD-10-CM | POA: Diagnosis not present

## 2020-02-10 DIAGNOSIS — M542 Cervicalgia: Secondary | ICD-10-CM | POA: Diagnosis not present

## 2020-02-10 DIAGNOSIS — S0990XA Unspecified injury of head, initial encounter: Secondary | ICD-10-CM | POA: Diagnosis present

## 2020-02-10 DIAGNOSIS — S8001XA Contusion of right knee, initial encounter: Secondary | ICD-10-CM

## 2020-02-10 DIAGNOSIS — S199XXA Unspecified injury of neck, initial encounter: Secondary | ICD-10-CM | POA: Insufficient documentation

## 2020-02-10 MED ORDER — ACETAMINOPHEN 500 MG PO TABS
1000.0000 mg | ORAL_TABLET | Freq: Once | ORAL | Status: AC
  Administered 2020-02-10: 1000 mg via ORAL
  Filled 2020-02-10: qty 2
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
## 2020-02-10 NOTE — ED Notes (Signed)
Pt transported to Radiology 

## 2020-02-10 NOTE — ED Notes (Signed)
Patient verbalizes understanding of discharge instructions. Opportunity for questioning and answers were provided. Armband removed by staff, pt discharged from ED ambulatory to home.  
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
## 2020-07-27 ENCOUNTER — Other Ambulatory Visit: Payer: Self-pay

## 2020-07-27 ENCOUNTER — Inpatient Hospital Stay (HOSPITAL_COMMUNITY)
Admission: EM | Admit: 2020-07-27 | Discharge: 2020-07-31 | DRG: 177 | Disposition: A | Discharge status: Home / Self Care | Payer: Medicare HMO | Attending: Internal Medicine | Admitting: Internal Medicine

## 2020-07-27 ENCOUNTER — Encounter (HOSPITAL_COMMUNITY): Payer: Self-pay

## 2020-07-27 ENCOUNTER — Emergency Department (HOSPITAL_COMMUNITY): Payer: Medicare HMO

## 2020-07-27 DIAGNOSIS — R0902 Hypoxemia: Secondary | ICD-10-CM

## 2020-07-27 DIAGNOSIS — Z7902 Long term (current) use of antithrombotics/antiplatelets: Secondary | ICD-10-CM

## 2020-07-27 DIAGNOSIS — K219 Gastro-esophageal reflux disease without esophagitis: Secondary | ICD-10-CM | POA: Diagnosis present

## 2020-07-27 DIAGNOSIS — J9601 Acute respiratory failure with hypoxia: Secondary | ICD-10-CM | POA: Diagnosis present

## 2020-07-27 DIAGNOSIS — F419 Anxiety disorder, unspecified: Secondary | ICD-10-CM | POA: Diagnosis present

## 2020-07-27 DIAGNOSIS — E039 Hypothyroidism, unspecified: Secondary | ICD-10-CM | POA: Diagnosis present

## 2020-07-27 DIAGNOSIS — R0602 Shortness of breath: Secondary | ICD-10-CM | POA: Diagnosis present

## 2020-07-27 DIAGNOSIS — Z8619 Personal history of other infectious and parasitic diseases: Secondary | ICD-10-CM

## 2020-07-27 DIAGNOSIS — Z8701 Personal history of pneumonia (recurrent): Secondary | ICD-10-CM | POA: Diagnosis not present

## 2020-07-27 DIAGNOSIS — M109 Gout, unspecified: Secondary | ICD-10-CM | POA: Diagnosis present

## 2020-07-27 DIAGNOSIS — E860 Dehydration: Secondary | ICD-10-CM | POA: Diagnosis present

## 2020-07-27 DIAGNOSIS — Z7982 Long term (current) use of aspirin: Secondary | ICD-10-CM | POA: Diagnosis not present

## 2020-07-27 DIAGNOSIS — N3281 Overactive bladder: Secondary | ICD-10-CM | POA: Diagnosis present

## 2020-07-27 DIAGNOSIS — F32A Depression, unspecified: Secondary | ICD-10-CM | POA: Diagnosis present

## 2020-07-27 DIAGNOSIS — M1990 Unspecified osteoarthritis, unspecified site: Secondary | ICD-10-CM | POA: Diagnosis present

## 2020-07-27 DIAGNOSIS — Z79899 Other long term (current) drug therapy: Secondary | ICD-10-CM

## 2020-07-27 DIAGNOSIS — Z79890 Hormone replacement therapy (postmenopausal): Secondary | ICD-10-CM | POA: Diagnosis not present

## 2020-07-27 DIAGNOSIS — I10 Essential (primary) hypertension: Secondary | ICD-10-CM | POA: Diagnosis present

## 2020-07-27 DIAGNOSIS — U071 COVID-19: Secondary | ICD-10-CM

## 2020-07-27 DIAGNOSIS — N179 Acute kidney failure, unspecified: Secondary | ICD-10-CM | POA: Diagnosis present

## 2020-07-27 DIAGNOSIS — J9600 Acute respiratory failure, unspecified whether with hypoxia or hypercapnia: Secondary | ICD-10-CM

## 2020-07-27 DIAGNOSIS — J1282 Pneumonia due to coronavirus disease 2019: Secondary | ICD-10-CM | POA: Diagnosis present

## 2020-07-27 DIAGNOSIS — E785 Hyperlipidemia, unspecified: Secondary | ICD-10-CM | POA: Diagnosis present

## 2020-07-27 DIAGNOSIS — I82431 Acute embolism and thrombosis of right popliteal vein: Secondary | ICD-10-CM | POA: Diagnosis present

## 2020-07-27 DIAGNOSIS — E872 Acidosis: Secondary | ICD-10-CM | POA: Diagnosis present

## 2020-07-27 DIAGNOSIS — R7989 Other specified abnormal findings of blood chemistry: Secondary | ICD-10-CM | POA: Diagnosis not present

## 2020-07-27 LAB — CBC WITH DIFFERENTIAL/PLATELET
Abs Immature Granulocytes: 0.1 10*3/uL — ABNORMAL HIGH (ref 0.00–0.07)
Basophils Absolute: 0 10*3/uL (ref 0.0–0.1)
Basophils Relative: 0 %
Eosinophils Absolute: 0 10*3/uL (ref 0.0–0.5)
Eosinophils Relative: 0 %
HCT: 41.8 % (ref 39.0–52.0)
Hemoglobin: 14.1 g/dL (ref 13.0–17.0)
Immature Granulocytes: 1 %
Lymphocytes Relative: 9 %
Lymphs Abs: 1 10*3/uL (ref 0.7–4.0)
MCH: 27.5 pg (ref 26.0–34.0)
MCHC: 33.7 g/dL (ref 30.0–36.0)
MCV: 81.5 fL (ref 80.0–100.0)
Monocytes Absolute: 0.9 10*3/uL (ref 0.1–1.0)
Monocytes Relative: 8 %
Neutro Abs: 8.5 10*3/uL — ABNORMAL HIGH (ref 1.7–7.7)
Neutrophils Relative %: 82 %
Platelets: 322 10*3/uL (ref 150–400)
RBC: 5.13 MIL/uL (ref 4.22–5.81)
RDW: 13.3 % (ref 11.5–15.5)
WBC: 10.6 10*3/uL — ABNORMAL HIGH (ref 4.0–10.5)
nRBC: 0 % (ref 0.0–0.2)

## 2020-07-27 LAB — TRIGLYCERIDES: Triglycerides: 125 mg/dL (ref <150)

## 2020-07-27 LAB — FERRITIN: Ferritin: 147 ng/mL (ref 24–336)

## 2020-07-27 LAB — COMPREHENSIVE METABOLIC PANEL
ALT: 95 U/L — ABNORMAL HIGH (ref 0–44)
AST: 111 U/L — ABNORMAL HIGH (ref 15–41)
Albumin: 3.1 g/dL — ABNORMAL LOW (ref 3.5–5.0)
Alkaline Phosphatase: 67 U/L (ref 38–126)
Anion gap: 13 (ref 5–15)
BUN: 34 mg/dL — ABNORMAL HIGH (ref 8–23)
CO2: 25 mmol/L (ref 22–32)
Calcium: 9.4 mg/dL (ref 8.9–10.3)
Chloride: 99 mmol/L (ref 98–111)
Creatinine, Ser: 1.5 mg/dL — ABNORMAL HIGH (ref 0.61–1.24)
GFR, Estimated: 47 mL/min — ABNORMAL LOW (ref >60)
Glucose, Bld: 133 mg/dL — ABNORMAL HIGH (ref 70–99)
Potassium: 4.3 mmol/L (ref 3.5–5.1)
Sodium: 137 mmol/L (ref 135–145)
Total Bilirubin: 1.1 mg/dL (ref 0.3–1.2)
Total Protein: 7.9 g/dL (ref 6.5–8.1)

## 2020-07-27 LAB — D-DIMER, QUANTITATIVE: D-Dimer, Quant: 7.47 ug/mL-FEU — ABNORMAL HIGH (ref 0.00–0.50)

## 2020-07-27 LAB — RESP PANEL BY RT-PCR (FLU A&B, COVID) ARPGX2
Influenza A by PCR: NEGATIVE
Influenza B by PCR: NEGATIVE
SARS Coronavirus 2 by RT PCR: POSITIVE — AB

## 2020-07-27 LAB — C-REACTIVE PROTEIN: CRP: 11 mg/dL — ABNORMAL HIGH (ref <1.0)

## 2020-07-27 LAB — PROCALCITONIN: Procalcitonin: <0.1 ng/mL

## 2020-07-27 LAB — FIBRINOGEN: Fibrinogen: 674 mg/dL — ABNORMAL HIGH (ref 210–475)

## 2020-07-27 LAB — LACTIC ACID, PLASMA: Lactic Acid, Venous: 2.4 mmol/L (ref 0.5–1.9)

## 2020-07-27 LAB — LACTATE DEHYDROGENASE: LDH: 244 U/L — ABNORMAL HIGH (ref 98–192)

## 2020-07-27 MED ORDER — PREDNISONE 20 MG PO TABS: 50.0000 mg | ORAL_TABLET | Freq: Every day | ORAL | Status: DC

## 2020-07-27 MED ORDER — VITAMIN D 25 MCG (1000 UNIT) PO TABS
2000.0000 [IU] | ORAL_TABLET | Freq: Every day | ORAL | Status: DC
  Administered 2020-07-27 – 2020-07-31 (×5): 2000 [IU] via ORAL
  Filled 2020-07-27 (×5): qty 2

## 2020-07-27 MED ORDER — ASCORBIC ACID 500 MG PO TABS
500.0000 mg | ORAL_TABLET | Freq: Every day | ORAL | Status: DC
  Administered 2020-07-27 – 2020-07-31 (×5): 500 mg via ORAL
  Filled 2020-07-27 (×5): qty 1

## 2020-07-27 MED ORDER — POLYETHYLENE GLYCOL 3350 17 G PO PACK: 17.0000 g | PACK | Freq: Every day | ORAL | Status: DC | PRN

## 2020-07-27 MED ORDER — BISOPROLOL-HYDROCHLOROTHIAZIDE 2.5-6.25 MG PO TABS
1.0000 | ORAL_TABLET | Freq: Every day | ORAL | Status: DC
  Filled 2020-07-27: qty 1

## 2020-07-27 MED ORDER — GUAIFENESIN-DM 100-10 MG/5ML PO SYRP: 10.0000 mL | ORAL_SOLUTION | ORAL | Status: DC | PRN

## 2020-07-27 MED ORDER — SODIUM CHLORIDE 0.9 % IV SOLN
100.0000 mg | Freq: Every day | INTRAVENOUS | Status: AC
  Administered 2020-07-28 – 2020-07-31 (×4): 100 mg via INTRAVENOUS
  Filled 2020-07-27 (×5): qty 20

## 2020-07-27 MED ORDER — ASPIRIN EC 81 MG PO TBEC
81.0000 mg | DELAYED_RELEASE_TABLET | Freq: Every day | ORAL | Status: DC
  Administered 2020-07-27 – 2020-07-30 (×4): 81 mg via ORAL
  Filled 2020-07-27 (×4): qty 1

## 2020-07-27 MED ORDER — ALLOPURINOL 300 MG PO TABS
300.0000 mg | ORAL_TABLET | Freq: Every day | ORAL | Status: DC
  Administered 2020-07-28 – 2020-07-31 (×4): 300 mg via ORAL
  Filled 2020-07-27 (×5): qty 1

## 2020-07-27 MED ORDER — SODIUM CHLORIDE 0.9% FLUSH
3.0000 mL | Freq: Two times a day (BID) | INTRAVENOUS | Status: DC
  Administered 2020-07-27 – 2020-07-31 (×8): 3 mL via INTRAVENOUS

## 2020-07-27 MED ORDER — ONDANSETRON HCL 4 MG PO TABS: 4.0000 mg | ORAL_TABLET | Freq: Four times a day (QID) | ORAL | Status: DC | PRN

## 2020-07-27 MED ORDER — ESCITALOPRAM OXALATE 20 MG PO TABS
20.0000 mg | ORAL_TABLET | Freq: Every day | ORAL | Status: DC
  Administered 2020-07-27 – 2020-07-31 (×5): 20 mg via ORAL
  Filled 2020-07-27: qty 2
  Filled 2020-07-27 (×6): qty 1

## 2020-07-27 MED ORDER — LEVOTHYROXINE SODIUM 112 MCG PO TABS
112.0000 ug | ORAL_TABLET | Freq: Every day | ORAL | Status: DC
  Administered 2020-07-28 – 2020-07-31 (×4): 112 ug via ORAL
  Filled 2020-07-27 (×4): qty 1

## 2020-07-27 MED ORDER — ENOXAPARIN SODIUM 40 MG/0.4ML ~~LOC~~ SOLN
40.0000 mg | SUBCUTANEOUS | Status: DC
  Administered 2020-07-27: 22:00:00 40 mg via SUBCUTANEOUS
  Filled 2020-07-27: qty 0.4

## 2020-07-27 MED ORDER — LACTATED RINGERS IV SOLN: INTRAVENOUS | Status: DC

## 2020-07-27 MED ORDER — ACETAMINOPHEN 325 MG PO TABS
650.0000 mg | ORAL_TABLET | Freq: Four times a day (QID) | ORAL | Status: DC | PRN
  Filled 2020-07-27: qty 2

## 2020-07-27 MED ORDER — ONDANSETRON HCL 4 MG/2ML IJ SOLN: 4.0000 mg | Freq: Four times a day (QID) | INTRAMUSCULAR | Status: DC | PRN

## 2020-07-27 MED ORDER — METHYLPREDNISOLONE SODIUM SUCC 125 MG IJ SOLR
1.0000 mg/kg | Freq: Two times a day (BID) | INTRAMUSCULAR | Status: DC
  Administered 2020-07-27: 22:00:00 75 mg via INTRAVENOUS
  Filled 2020-07-27: qty 2

## 2020-07-27 MED ORDER — SODIUM CHLORIDE 0.9 % IV SOLN
200.0000 mg | Freq: Once | INTRAVENOUS | Status: AC
  Administered 2020-07-27: 200 mg via INTRAVENOUS
  Filled 2020-07-27: qty 40

## 2020-07-27 MED ORDER — PRAVASTATIN SODIUM 10 MG PO TABS
20.0000 mg | ORAL_TABLET | Freq: Every day | ORAL | Status: DC
  Administered 2020-07-28 – 2020-07-30 (×3): 20 mg via ORAL
  Filled 2020-07-27 (×3): qty 2

## 2020-07-27 MED ORDER — VITAMIN B-12 1000 MCG PO TABS
1000.0000 ug | ORAL_TABLET | Freq: Every day | ORAL | Status: DC
  Administered 2020-07-27 – 2020-07-31 (×5): 1000 ug via ORAL
  Filled 2020-07-27 (×5): qty 1

## 2020-07-27 MED ORDER — DEXAMETHASONE SODIUM PHOSPHATE 10 MG/ML IJ SOLN
10.0000 mg | Freq: Once | INTRAMUSCULAR | Status: AC
  Administered 2020-07-27: 19:00:00 10 mg via INTRAVENOUS
  Filled 2020-07-27: qty 1

## 2020-07-27 MED ORDER — HYDROCOD POLST-CPM POLST ER 10-8 MG/5ML PO SUER: 5.0000 mL | Freq: Two times a day (BID) | ORAL | Status: DC | PRN

## 2020-07-27 MED ORDER — ZINC SULFATE 220 (50 ZN) MG PO CAPS
220.0000 mg | ORAL_CAPSULE | Freq: Every day | ORAL | Status: DC
  Administered 2020-07-27 – 2020-07-31 (×5): 220 mg via ORAL
  Filled 2020-07-27 (×5): qty 1

## 2020-07-27 NOTE — ED Notes (Signed)
No pt in room at this time

## 2020-07-27 NOTE — H&P (Addendum)
History and Physical    Arad Burston NLG:[id number] DOB: Feb 25, 1941 DOA: 07/27/2020  PCP: Timoteo Gaul, FNP   Patient coming from: Home  I have personally briefly reviewed patient's old medical records in [place]  Chief Complaint: Shortness of breath, cough and fatigue  HPI: Angel Keller. is a 79 y.o. male with medical history significant of hypertension, hyperlipidemia, and hypothyroidism came to ED with worsening shortness of breath, fatigue and cough for 1-2 week.  Patient recently saw his PCP for upper respiratory symptoms and was given prednisone and Zithromax.  He was not tested for Covid.  Patient is vaccinated and received last dose 57-monthago.  Lives with his daughter who is not sick.  Denies any other sick contacts.  Denies any fever or chills.  Per patient he lost his appetite for the past couple of weeks, having some cough and worsening shortness of breath since then.  Had 1 episode of diarrhea today.  Denies any nausea or vomiting.  No urinary symptoms.  ED Course: On arrival he was febrile at 100.1, hypoxic at 88% requiring 2 L of oxygen, labs positive for COVID-19, mild leukocytosis, creatinine at 1.5 and lactic acid of 2.4 and chest x-ray with bilateral infiltrate consistent with COVID-19 pneumonia.  Elevated inflammatory markers, blood cultures obtained.  Review of Systems: As per HPI otherwise 10 point review of systems negative.   Past Medical History:  Diagnosis Date  . Aneurysm ([place]   . Anxiety   . Arthritis   . Depression   . Headache   . HLD (hyperlipidemia)   . Hx of inguinal hernia repair   . Hypertension   . Hypothyroidism   . Overactive bladder   . Pneumonia   . Shingles   . Thyroid disease     Past Surgical History:  Procedure Laterality Date  . BILATERAL CARPAL TUNNEL RELEASE    . CATARACT EXTRACTION, BILATERAL Bilateral   . ELBOW SURGERY Bilateral   . INGUINAL HERNIA REPAIR Right 1951  . IR ANGIO INTRA EXTRACRAN SEL COM  CAROTID INNOMINATE BILAT MOD SED  02/20/2019  . IR GENERIC HISTORICAL  10/28/2016   IR RADIOLOGIST EVAL & MGMT 10/28/2016 [facility]  . RADIOLOGY WITH ANESTHESIA N/A 10/21/2015   Procedure: RADIOLOGY WITH ANESTHESIA;  Surgeon: SLuanne Bras MD;  [place] [facility]  Service: Radiology;  Laterality: N/A;  . RADIOLOGY WITH ANESTHESIA N/A 02/20/2019   Procedure: [employer]  Surgeon: DLuanne Bras MD;  [place] [facility]  Service: Radiology;  Laterality: N/A;  . ROTATOR CUFF REPAIR Bilateral   . VARICOSE VEIN SURGERY       reports that he quit smoking about 51 years ago. He has a 0.25 pack-year smoking history. He has never used smokeless tobacco. He reports that he does not drink alcohol and does not use drugs.  No Known Allergies  Family History  Problem Relation Age of Onset  . Breast cancer Mother   . Lung cancer Father   . Alcoholism Sister   . Cirrhosis Sister   . Colon cancer Neg Hx   . Esophageal cancer Neg Hx   . Liver cancer Neg Hx   . Pancreatic cancer Neg Hx   . Prostate cancer Neg Hx     Prior to Admission medications   Medication Sig Start Date End Date Taking? Authorizing Provider  allopurinol (ZYLOPRIM) 300 MG tablet Take 300 mg by mouth daily. 02/04/20  Yes [provider]  aspirin EC 81 MG tablet Take  81 mg by mouth daily.    Yes [provider]  azithromycin (ZITHROMAX) 250 MG tablet Take 250 mg by mouth daily. 07/20/20  Yes [provider]  benzonatate (TESSALON) 100 MG capsule Take 100 mg by mouth 3 (three) times daily. 07/20/20  Yes [provider]  bisoprolol-hydrochlorothiazide (ZIAC) 2.5-6.25 MG tablet Take 1 tablet by mouth daily. 06/20/15  Yes [provider]  Cholecalciferol (D3 ADULT PO) Take 2,000 Units by mouth daily.   Yes [provider]  Cyanocobalamin (B-12 PO) Take 1,000 mcg by mouth daily.   Yes [provider]  escitalopram (LEXAPRO) 20 MG tablet Take 20 mg by mouth daily. 12/22/18  Yes  [provider]  lovastatin (MEVACOR) 20 MG tablet Take 20 mg by mouth daily. 07/25/15  Yes [provider]  predniSONE (DELTASONE) 5 MG tablet Take 5-30 mg by mouth See admin instructions. Take 6 tablets (30 mg totally) by mouth in day 1; 5 tablets (25 mg totally) by mouth in day 2; 4 tablets (20 mg totally) by mouth in day 3; 3 tablets (15 mg totally) by mouth in day 4; 2 tablets (10 mg totally) by mouth in day 5; 1 tablets (5 mg totally) in day 6   Yes [provider]  SYNTHROID 112 MCG tablet Take 112 mcg by mouth daily. 07/02/20  Yes [provider]  traMADol (ULTRAM) 50 MG tablet Take 1 tablet (50 mg total) by mouth every 6 (six) hours as needed. Patient not taking: No sig reported 01/19/20   Drenda Freeze, MD    Physical Exam: Vitals:   07/27/20 1532 07/27/20 1735 07/27/20 1815 07/27/20 1900  BP: 139/85 (!) 171/81 133/72 (!) 146/76  Pulse: (!) 53 60 (!) 58 (!) 56  Resp: 19 18 (!) 25 15  Temp: 98.7 F (37.1 C)     TempSrc: Oral     SpO2: 94% 93% 98% 95%    General: Vital signs reviewed.  Patient is well-developed and well-nourished, in no acute distress and cooperative with exam.  Head: Normocephalic and atraumatic. Eyes: EOMI, conjunctivae normal, no scleral icterus.  ENMT: Mucous membranes are dry. Neck: Supple, trachea midline, normal ROM,  Cardiovascular: RRR, S1 normal, S2 normal, no murmurs, gallops, or rubs. Pulmonary/Chest: Few scattered rhonchi. Abdominal: Soft, non-tender, non-distended, BS +,  Extremities: No lower extremity edema bilaterally,  pulses symmetric and intact bilaterally. No cyanosis or clubbing. Neurological: A&O x3, Strength is normal and symmetric bilaterally, cranial nerve II-XII are grossly intact, no focal motor deficit, sensory intact to light touch bilaterally.  Skin: Warm, dry and intact. No rashes or erythema. Psychiatric: Normal mood and affect. speech and behavior is normal. Cognition and memory are  normal.   Labs on Admission: I have personally reviewed following labs and imaging studies  CBC: Recent Labs  Lab 07/27/20 1327  WBC 10.6*  NEUTROABS 8.5*  HGB 14.1  HCT 41.8  MCV 81.5  PLT 498   Basic Metabolic Panel: Recent Labs  Lab 07/27/20 1327  NA 137  K 4.3  CL 99  CO2 25  GLUCOSE 133*  BUN 34*  CREATININE 1.50*  CALCIUM 9.4   GFR: CrCl cannot be calculated (Unknown ideal weight.). Liver Function Tests: Recent Labs  Lab 07/27/20 1327  AST 111*  ALT 95*  ALKPHOS 67  BILITOT 1.1  PROT 7.9  ALBUMIN 3.1*   No results for input(s): LIPASE, AMYLASE in the last 168 hours. No results for input(s): AMMONIA in the last 168 hours. Coagulation  Profile: No results for input(s): INR, PROTIME in the last 168 hours. Cardiac Enzymes: No results for input(s): CKTOTAL, CKMB, CKMBINDEX, TROPONINI in the last 168 hours. BNP (last 3 results) No results for input(s): PROBNP in the last 8760 hours. HbA1C: No results for input(s): HGBA1C in the last 72 hours. CBG: No results for input(s): GLUCAP in the last 168 hours. Lipid Profile: Recent Labs    07/27/20 1730  TRIG 125   Thyroid Function Tests: No results for input(s): TSH, T4TOTAL, FREET4, T3FREE, THYROIDAB in the last 72 hours. Anemia Panel: Recent Labs    07/27/20 1730  FERRITIN 147   Urine analysis:    Component Value Date/Time   COLORURINE YELLOW 02/20/2019 0608   APPEARANCEUR CLEAR 02/20/2019 0608   LABSPEC 1.021 02/20/2019 0608   PHURINE 5.0 02/20/2019 0608   GLUCOSEU NEGATIVE 02/20/2019 0608   GLUCOSEU NEGATIVE 11/07/2008 0000   HGBUR NEGATIVE 02/20/2019 0608   BILIRUBINUR NEGATIVE 02/20/2019 0608   KETONESUR NEGATIVE 02/20/2019 0608   PROTEINUR NEGATIVE 02/20/2019 0608   UROBILINOGEN 1.0 07/09/2009 2019   NITRITE NEGATIVE 02/20/2019 0608   LEUKOCYTESUR NEGATIVE 02/20/2019 0240    Radiological Exams on Admission: DG Chest 2 View  Result Date: 07/27/2020 CLINICAL DATA:  Shortness of  breath EXAM: CHEST - 2 VIEW COMPARISON:  03/06/2017 FINDINGS: The heart size and mediastinal contours are within normal limits. Patchy perihilar and bibasilar airspace opacities, left greater than right. No pleural effusion or pneumothorax. The visualized skeletal structures are unremarkable. IMPRESSION: Patchy perihilar and bibasilar airspace opacities, left greater than right. Findings are suspicious for multifocal atypical/viral pneumonia. Electronically Signed   By: Davina Poke D.O.   On: 07/27/2020 12:44    EKG: Independently reviewed.  Sinus rhythm, no acute abnormality.  Assessment/Plan Active Problems:   Pneumonia due to COVID-19 virus   Acute hypoxic respiratory failure secondary to COVID-19 pneumonia.  Patient initially met sepsis criteria with fever, leukocytosis, hypoxia, AKI and lactic acidosis.  Procalcitonin negative.  Blood cultures pending. Elevated inflammatory markers.  CTA was not ordered due to AKI, currently stable but need to rule out PE if worsening hypoxia. -Start him on remdesivir. -Solu-Medrol followed by prednisone. -Monitor inflammatory markers. -Supportive care with antitussives and vitamins. -Supplemental oxygen to keep the saturation above 90%. -Monitor lactic acid. -Follow-up blood cultures.  Hypertension.  Blood pressure within goal at this time.  Patient's takes bisoprolol and HCTZ at home. -Holding home meds with AKI and concern of sepsis. -Hydralazine as needed.  Hypothyroidism. -Continue home dose of Synthroid  History of depression.  No acute concern. -Continue home dose of Lexapro  AKI .  Patient appears dry, poor p.o. intake. -Give him some gentle fluid. -Monitor renal function. -Avoid nephrotoxins   DVT prophylaxis: Lovenox Code Status: Full code Family Communication: Discussed with patient Disposition Plan: Home Consults called: None Admission status: Inpatient   Lorella Nimrod MD [facility]  If 7PM-7AM, please  contact night-coverage [email or web address]  07/27/2020, 8:08 PM   This record has been created using Systems analyst. Errors have been sought and corrected,but may not always be located. Such creation errors do not reflect on the standard of care.

## 2020-07-27 NOTE — ED Triage Notes (Signed)
Pt BIB [facility] from home with ongoing cough, congestion, fatigue and SOB w/exertion x4 weeks. Pt seen his PCP 3 weeks ago, prescribed, prednisone and azithromycin, took 1 pill of each but didn't want to finish the full prescription. Pt reports approx 10 lb weight loss and increased lethargy, usually very active but unable to do anything but sleep lately. Family reports a productive cough with greenish/yellow phlegm. PCP did not test for covid, pt unvaccinated.  90% RA dropped to 88-89% while walking. 96% with 2L [place]   BP 140/74 HR 68 RR 20 98.2 temp

## 2020-07-27 NOTE — ED Provider Notes (Addendum)
[facility] EMERGENCY DEPARTMENT Provider Note   CSN: [id number] Arrival date & time: 07/27/20  1157     History Chief Complaint  Patient presents with  . Shortness of Breath  . Cough  . Fatigue    Angel Keller. is a 79 y.o. male.  HPI   This patient is an 79 year old male, he has a known history of hyperlipidemia, hypothyroidism he currently takes Plavix, antihypertensives, statin and Synthroid and according to the patient was recently given prednisone and Zithromax for a respiratory illness though he was not tested for Covid.  He was vaccinated about 3 months ago, states that he lives with his daughter who has not been sick but over the last month he has had some progressive symptoms, predominantly over the last 2 weeks where he has lost his appetite, lost weight and now become more short of breath with more frequent coughing.  He does not have objective fevers has had occasional diarrhea and nausea, denies rashes, denies headaches, denies blurred vision, no meds given prior to arrival for the specific illness.    Past Medical History:  Diagnosis Date  . Aneurysm ([place])   . Anxiety   . Arthritis   . Depression   . Headache   . HLD (hyperlipidemia)   . Hx of inguinal hernia repair   . Hypertension   . Hypothyroidism   . Overactive bladder   . Pneumonia   . Shingles   . Thyroid disease     Patient Active Problem List   Diagnosis Date Noted  . 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 HYPOTHYROIDISM 07/15/2009  . FATIGUE 07/15/2009  . SORE THROAT 06/11/2009  . ALLERGIC RHINITIS 05/25/2009  . HOARSENESS 05/25/2009  . HYPERTHYROIDISM 11/07/2008  . HYPERTENSION 11/07/2008  . DIVERTICULOSIS, COLON 11/07/2008  . HYPERLIPIDEMIA 10/04/2007  . ANXIETY 10/04/2007  . DEPRESSION 10/04/2007  . GERD 10/04/2007  . COLONIC POLYPS, HX OF 10/04/2007    Past Surgical History:  Procedure Laterality Date  . BILATERAL  CARPAL TUNNEL RELEASE    . CATARACT EXTRACTION, BILATERAL Bilateral   . ELBOW SURGERY Bilateral   . INGUINAL HERNIA REPAIR Right 1951  . IR ANGIO INTRA EXTRACRAN SEL COM CAROTID INNOMINATE BILAT MOD SED  02/20/2019  . IR GENERIC HISTORICAL  10/28/2016   IR RADIOLOGIST EVAL & MGMT 10/28/2016 [facility]  . RADIOLOGY WITH ANESTHESIA N/A 10/21/2015   Procedure: RADIOLOGY WITH ANESTHESIA;  Surgeon: Luanne Bras, MD;  Location: [facility];  Service: Radiology;  Laterality: N/A;  . RADIOLOGY WITH ANESTHESIA N/A 02/20/2019   Procedure: [employer];  Surgeon: Luanne Bras, MD;  Location: [place];  Service: Radiology;  Laterality: N/A;  . ROTATOR CUFF REPAIR Bilateral   . VARICOSE VEIN SURGERY         Family History  Problem Relation Age of Onset  . Breast cancer Mother   . Lung cancer Father   . Alcoholism Sister   . Cirrhosis Sister   . Colon cancer Neg Hx   . Esophageal cancer Neg Hx   . Liver cancer Neg Hx   . Pancreatic cancer Neg Hx   . Prostate cancer Neg Hx     Social History   Tobacco Use  . Smoking status: Former Smoker    Packs/day: 0.25    Years: 1.00    Pack years: 0.25    Quit date: 08/15/1968    Years since quitting: 51.9  . Smokeless tobacco: Never Used  Vaping Use  . Vaping Use: Never used  Substance Use Topics  . Alcohol use: No  . Drug use: No    Home Medications Prior to Admission medications   Medication Sig Start Date End Date Taking? Authorizing Provider  allopurinol (ZYLOPRIM) 300 MG tablet Take 300 mg by mouth daily. 02/04/20  Yes [provider]  aspirin EC 81 MG tablet Take 81 mg by mouth daily.    Yes [provider]  azithromycin (ZITHROMAX) 250 MG tablet Take 250 mg by mouth daily. 07/20/20  Yes [provider]  benzonatate (TESSALON) 100 MG capsule Take 100 mg by mouth 3 (three) times daily. 07/20/20  Yes [provider]  bisoprolol-hydrochlorothiazide (ZIAC) 2.5-6.25 MG tablet Take 1 tablet by mouth daily.  06/20/15  Yes [provider]  Cholecalciferol (D3 ADULT PO) Take 2,000 Units by mouth daily.   Yes [provider]  Cyanocobalamin (B-12 PO) Take 1,000 mcg by mouth daily.   Yes [provider]  escitalopram (LEXAPRO) 20 MG tablet Take 20 mg by mouth daily. 12/22/18  Yes [provider]  lovastatin (MEVACOR) 20 MG tablet Take 20 mg by mouth daily. 07/25/15  Yes [provider]  predniSONE (DELTASONE) 5 MG tablet Take 5-30 mg by mouth See admin instructions. Take 6 tablets (30 mg totally) by mouth in day 1; 5 tablets (25 mg totally) by mouth in day 2; 4 tablets (20 mg totally) by mouth in day 3; 3 tablets (15 mg totally) by mouth in day 4; 2 tablets (10 mg totally) by mouth in day 5; 1 tablets (5 mg totally) in day 6   Yes [provider]  SYNTHROID 112 MCG tablet Take 112 mcg by mouth daily. 07/02/20  Yes [provider]  traMADol (ULTRAM) 50 MG tablet Take 1 tablet (50 mg total) by mouth every 6 (six) hours as needed. Patient not taking: No sig reported 01/19/20   Drenda Freeze, MD    Allergies    Patient has no known allergies.  Review of Systems   Review of Systems  All other systems reviewed and are negative.   Physical Exam Updated Vital Signs BP 133/72   Pulse (!) 58   Temp 98.7 F (37.1 C) (Oral)   Resp (!) 25   SpO2 98%   Physical Exam Vitals and nursing note reviewed.  Constitutional:      General: He is in acute distress.     Appearance: He is well-developed and well-nourished.  HENT:     Head: Normocephalic and atraumatic.     Mouth/Throat:     Mouth: Oropharynx is clear and moist.     Pharynx: No oropharyngeal exudate.  Eyes:     General: No scleral icterus.       Right eye: No discharge.        Left eye: No discharge.     Extraocular Movements: EOM normal.     Conjunctiva/sclera: Conjunctivae normal.     Pupils: Pupils are equal, round, and reactive to light.  Neck:     Thyroid: No thyromegaly.      Vascular: No JVD.  Cardiovascular:     Rate and Rhythm: Normal rate and regular rhythm.     Pulses: Intact distal pulses.     Heart sounds: Normal heart sounds. No murmur heard. No friction rub. No gallop.   Pulmonary:     Effort: Respiratory distress present.     Breath sounds: Rales present. No wheezing.  Abdominal:  General: Bowel sounds are normal. There is no distension.     Palpations: Abdomen is soft. There is no mass.     Tenderness: There is no abdominal tenderness.  Musculoskeletal:        General: No tenderness or edema. Normal range of motion.     Cervical back: Normal range of motion and neck supple.  Lymphadenopathy:     Cervical: No cervical adenopathy.  Skin:    General: Skin is warm and dry.     Findings: No erythema or rash.  Neurological:     Mental Status: He is alert.     Coordination: Coordination normal.  Psychiatric:        Mood and Affect: Mood and affect normal.        Behavior: Behavior normal.     ED Results / Procedures / Treatments   Labs (all labs ordered are listed, but only abnormal results are displayed) Labs Reviewed  RESP PANEL BY RT-PCR (FLU A&B, COVID) ARPGX2 - Abnormal; Notable for the following components:      Result Value   SARS Coronavirus 2 by RT PCR POSITIVE (*)    All other components within normal limits  CBC WITH DIFFERENTIAL/PLATELET - Abnormal; Notable for the following components:   WBC 10.6 (*)    Neutro Abs 8.5 (*)    Abs Immature Granulocytes 0.10 (*)    All other components within normal limits  COMPREHENSIVE METABOLIC PANEL - Abnormal; Notable for the following components:   Glucose, Bld 133 (*)    BUN 34 (*)    Creatinine, Ser 1.50 (*)    Albumin 3.1 (*)    AST 111 (*)    ALT 95 (*)    GFR, Estimated 47 (*)    All other components within normal limits  LACTIC ACID, PLASMA - Abnormal; Notable for the following components:   Lactic Acid, Venous 2.4 (*)    All other components within normal limits   LACTATE DEHYDROGENASE - Abnormal; Notable for the following components:   LDH 244 (*)    All other components within normal limits  C-REACTIVE PROTEIN - Abnormal; Notable for the following components:   CRP 11.0 (*)    All other components within normal limits  CULTURE, BLOOD (ROUTINE X 2)  CULTURE, BLOOD (ROUTINE X 2)  FERRITIN  TRIGLYCERIDES  LACTIC ACID, PLASMA  D-DIMER, QUANTITATIVE (NOT AT [facility])  PROCALCITONIN  FIBRINOGEN    EKG EKG Interpretation  Date/Time:  Monday July 27 2020 19:03:52 EST Ventricular Rate:  56 PR Interval:    QRS Duration: 100 QT Interval:  494 QTC Calculation: 477 R Axis:   39 Text Interpretation: Sinus rhythm Abnormal R-wave progression, early transition Borderline prolonged QT interval Confirmed by Noemi Chapel [phone]) on 07/27/2020 7:07:04 PM   Radiology DG Chest 2 View  Result Date: 07/27/2020 CLINICAL DATA:  Shortness of breath EXAM: CHEST - 2 VIEW COMPARISON:  03/06/2017 FINDINGS: The heart size and mediastinal contours are within normal limits. Patchy perihilar and bibasilar airspace opacities, left greater than right. No pleural effusion or pneumothorax. The visualized skeletal structures are unremarkable. IMPRESSION: Patchy perihilar and bibasilar airspace opacities, left greater than right. Findings are suspicious for multifocal atypical/viral pneumonia. Electronically Signed   By: Davina Poke D.O.   On: 07/27/2020 12:44    Procedures .Critical Care Performed by: Noemi Chapel, MD Authorized by: Noemi Chapel, MD   Critical care provider statement:    Critical care time (minutes):  35   Critical care time  was exclusive of:  Separately billable procedures and treating other patients and teaching time   Critical care was necessary to treat or prevent imminent or life-threatening deterioration of the following conditions:  Respiratory failure   Critical care was time spent personally by me on the following activities:  Blood  draw for specimens, development of treatment plan with patient or surrogate, discussions with consultants, evaluation of patient's response to treatment, examination of patient, obtaining history from patient or surrogate, ordering and performing treatments and interventions, ordering and review of laboratory studies, ordering and review of radiographic studies, pulse oximetry, re-evaluation of patient's condition and review of old charts   (including critical care time)  Medications Ordered in ED Medications  dexamethasone (DECADRON) injection 10 mg (10 mg Intravenous Given 07/27/20 1853)    ED Course  I have reviewed the triage vital signs and the nursing notes.  Pertinent labs & imaging results that were available during my care of the patient were reviewed by me and considered in my medical decision making (see chart for details).    MDM Rules/Calculators/A&P                          This patient appears to be in mild respiratory distress, he has an oxygen level that dropped to 88% with any movement, he is tachypneic and has rales right greater than left.  This is consistent with his chest x-ray which I personally seen viewed and interpreted which show some bibasilar airspace opacities.  His Covid test is positive, his labs are overall unremarkable, he does have some renal insufficiency which is close to his baseline but has now a transaminitis as well.  He will need to be admitted to the hospital for this new diagnosis of COVID-19 acute hypoxic respiratory failure.  Angel Celeste Sr. was evaluated in Emergency Department on 07/27/2020 for the symptoms described in the history of present illness. He was evaluated in the context of the global COVID-19 pandemic, which necessitated consideration that the patient might be at risk for infection with the SARS-CoV-2 virus that causes COVID-19. Institutional protocols and algorithms that pertain to the evaluation of patients at risk for COVID-19 are  in a state of rapid change based on information released by regulatory bodies including the CDC and federal and state organizations. These policies and algorithms were followed during the patient's care in the ED.  Albuterol, Tessalon, Decadron Admission to the hospital Discussed with hospitalist at 6:43 who will admit  Final Clinical Impression(s) / ED Diagnoses Final diagnoses:  Acute respiratory failure due to COVID-19 [facility])  Hypoxia      Noemi Chapel, MD 07/27/20 Daneil Dolin    Noemi Chapel, MD 07/27/20 [phone]

## 2020-07-28 ENCOUNTER — Inpatient Hospital Stay (HOSPITAL_COMMUNITY): Payer: Medicare HMO

## 2020-07-28 DIAGNOSIS — U071 COVID-19: Secondary | ICD-10-CM

## 2020-07-28 DIAGNOSIS — R7989 Other specified abnormal findings of blood chemistry: Secondary | ICD-10-CM

## 2020-07-28 LAB — COMPREHENSIVE METABOLIC PANEL
ALT: 85 U/L — ABNORMAL HIGH (ref 0–44)
AST: 88 U/L — ABNORMAL HIGH (ref 15–41)
Albumin: 2.6 g/dL — ABNORMAL LOW (ref 3.5–5.0)
Alkaline Phosphatase: 58 U/L (ref 38–126)
Anion gap: 10 (ref 5–15)
BUN: 24 mg/dL — ABNORMAL HIGH (ref 8–23)
CO2: 26 mmol/L (ref 22–32)
Calcium: 8.9 mg/dL (ref 8.9–10.3)
Chloride: 100 mmol/L (ref 98–111)
Creatinine, Ser: 1 mg/dL (ref 0.61–1.24)
GFR, Estimated: >60 mL/min (ref >60)
Glucose, Bld: 115 mg/dL — ABNORMAL HIGH (ref 70–99)
Potassium: 4 mmol/L (ref 3.5–5.1)
Sodium: 136 mmol/L (ref 135–145)
Total Bilirubin: 0.7 mg/dL (ref 0.3–1.2)
Total Protein: 6.5 g/dL (ref 6.5–8.1)

## 2020-07-28 LAB — BLOOD CULTURE ID PANEL (REFLEXED) - BCID2

## 2020-07-28 LAB — CBC WITH DIFFERENTIAL/PLATELET
Abs Immature Granulocytes: 0.08 10*3/uL — ABNORMAL HIGH (ref 0.00–0.07)
Basophils Absolute: 0 10*3/uL (ref 0.0–0.1)
Basophils Relative: 0 %
Eosinophils Absolute: 0 10*3/uL (ref 0.0–0.5)
Eosinophils Relative: 0 %
HCT: 38.2 % — ABNORMAL LOW (ref 39.0–52.0)
Hemoglobin: 12.8 g/dL — ABNORMAL LOW (ref 13.0–17.0)
Immature Granulocytes: 1 %
Lymphocytes Relative: 9 %
Lymphs Abs: 0.6 10*3/uL — ABNORMAL LOW (ref 0.7–4.0)
MCH: 26.9 pg (ref 26.0–34.0)
MCHC: 33.5 g/dL (ref 30.0–36.0)
MCV: 80.4 fL (ref 80.0–100.0)
Monocytes Absolute: 0.4 10*3/uL (ref 0.1–1.0)
Monocytes Relative: 6 %
Neutro Abs: 5.8 10*3/uL (ref 1.7–7.7)
Neutrophils Relative %: 84 %
Platelets: 305 10*3/uL (ref 150–400)
RBC: 4.75 MIL/uL (ref 4.22–5.81)
RDW: 13.1 % (ref 11.5–15.5)
WBC: 6.9 10*3/uL (ref 4.0–10.5)
nRBC: 0 % (ref 0.0–0.2)

## 2020-07-28 LAB — OSMOLALITY, URINE: Osmolality, Ur: 970 mOsm/kg — ABNORMAL HIGH (ref 300–900)

## 2020-07-28 LAB — CREATININE, SERUM
Creatinine, Ser: 1.14 mg/dL (ref 0.61–1.24)
GFR, Estimated: >60 mL/min (ref >60)

## 2020-07-28 LAB — OSMOLALITY: Osmolality: 296 mOsm/kg — ABNORMAL HIGH (ref 275–295)

## 2020-07-28 LAB — CBC
HCT: 36.4 % — ABNORMAL LOW (ref 39.0–52.0)
Hemoglobin: 12.3 g/dL — ABNORMAL LOW (ref 13.0–17.0)
MCH: 27.4 pg (ref 26.0–34.0)
MCHC: 33.8 g/dL (ref 30.0–36.0)
MCV: 81.1 fL (ref 80.0–100.0)
Platelets: 288 10*3/uL (ref 150–400)
RBC: 4.49 MIL/uL (ref 4.22–5.81)
RDW: 13.3 % (ref 11.5–15.5)
WBC: 6 10*3/uL (ref 4.0–10.5)
nRBC: 0 % (ref 0.0–0.2)

## 2020-07-28 LAB — URINALYSIS, ROUTINE W REFLEX MICROSCOPIC
Bacteria, UA: NONE SEEN
Bilirubin Urine: NEGATIVE
Glucose, UA: NEGATIVE mg/dL
Ketones, ur: NEGATIVE mg/dL
Leukocytes,Ua: NEGATIVE
Nitrite: NEGATIVE
Protein, ur: NEGATIVE mg/dL
Specific Gravity, Urine: 1.029 (ref 1.005–1.030)
pH: 5 (ref 5.0–8.0)

## 2020-07-28 LAB — MAGNESIUM: Magnesium: 2.3 mg/dL (ref 1.7–2.4)

## 2020-07-28 LAB — BRAIN NATRIURETIC PEPTIDE: B Natriuretic Peptide: 129.9 pg/mL — ABNORMAL HIGH (ref 0.0–100.0)

## 2020-07-28 LAB — CREATININE, URINE, RANDOM: Creatinine, Urine: 146.89 mg/dL

## 2020-07-28 LAB — SODIUM, URINE, RANDOM: Sodium, Ur: 71 mmol/L

## 2020-07-28 LAB — D-DIMER, QUANTITATIVE: D-Dimer, Quant: 3.1 ug/mL-FEU — ABNORMAL HIGH (ref 0.00–0.50)

## 2020-07-28 LAB — C-REACTIVE PROTEIN: CRP: 10.7 mg/dL — ABNORMAL HIGH (ref <1.0)

## 2020-07-28 LAB — MRSA PCR SCREENING: MRSA by PCR: NEGATIVE

## 2020-07-28 LAB — TSH: TSH: 2.145 u[IU]/mL (ref 0.350–4.500)

## 2020-07-28 MED ORDER — BISOPROLOL FUMARATE 5 MG PO TABS
5.0000 mg | ORAL_TABLET | Freq: Every day | ORAL | Status: DC
  Administered 2020-07-28 – 2020-07-31 (×4): 5 mg via ORAL
  Filled 2020-07-28 (×5): qty 1

## 2020-07-28 MED ORDER — PANTOPRAZOLE SODIUM 40 MG PO TBEC
40.0000 mg | DELAYED_RELEASE_TABLET | Freq: Every day | ORAL | Status: DC
  Administered 2020-07-28 – 2020-07-31 (×4): 40 mg via ORAL
  Filled 2020-07-28 (×4): qty 1

## 2020-07-28 MED ORDER — METHYLPREDNISOLONE SODIUM SUCC 125 MG IJ SOLR
60.0000 mg | Freq: Two times a day (BID) | INTRAMUSCULAR | Status: DC
  Administered 2020-07-28 – 2020-07-31 (×7): 60 mg via INTRAVENOUS
  Filled 2020-07-28 (×7): qty 2

## 2020-07-28 MED ORDER — ENOXAPARIN SODIUM 300 MG/3ML IJ SOLN
0.5000 mg/kg | Freq: Two times a day (BID) | INTRAMUSCULAR | Status: DC
  Administered 2020-07-28: 11:00:00 35 mg via SUBCUTANEOUS
  Filled 2020-07-28 (×6): qty 0.35

## 2020-07-28 MED ORDER — ENOXAPARIN SODIUM 80 MG/0.8ML ~~LOC~~ SOLN
75.0000 mg | Freq: Two times a day (BID) | SUBCUTANEOUS | Status: DC
  Administered 2020-07-28 – 2020-07-30 (×4): 75 mg via SUBCUTANEOUS
  Filled 2020-07-28 (×4): qty 0.8

## 2020-07-28 NOTE — Plan of Care (Signed)

## 2020-07-28 NOTE — Progress Notes (Signed)
PHARMACY - PHYSICIAN COMMUNICATION CRITICAL VALUE ALERT - BLOOD CULTURE IDENTIFICATION (BCID)  Angel Keller. is an 79 y.o. male who presented to [facility] on 07/27/2020  Assessment:  89 yom presenting with acute hypoxic respiratory failure due COVID-19 PNA. 1 of 4 BCx now growing coag negative staph.  Name of physician (or Provider) ContactedMarlowe Keller, V via text page  Current antibiotics: none  Changes to prescribed antibiotics recommended:  No change - likely contaminant  Results for orders placed or performed during the hospital encounter of 07/27/20  Blood Culture ID Panel (Reflexed) (Collected: 07/27/2020  5:30 PM)  Result Value Ref Range   Enterococcus faecalis NOT DETECTED NOT DETECTED   Enterococcus Faecium NOT DETECTED NOT DETECTED   Listeria monocytogenes NOT DETECTED NOT DETECTED   Staphylococcus species DETECTED (A) NOT DETECTED   Staphylococcus aureus (BCID) NOT DETECTED NOT DETECTED   Staphylococcus epidermidis NOT DETECTED NOT DETECTED   Staphylococcus lugdunensis NOT DETECTED NOT DETECTED   Streptococcus species NOT DETECTED NOT DETECTED   Streptococcus agalactiae NOT DETECTED NOT DETECTED   Streptococcus pneumoniae NOT DETECTED NOT DETECTED   Streptococcus pyogenes NOT DETECTED NOT DETECTED   A.calcoaceticus-baumannii NOT DETECTED NOT DETECTED   Bacteroides fragilis NOT DETECTED NOT DETECTED   Enterobacterales NOT DETECTED NOT DETECTED   Enterobacter cloacae complex NOT DETECTED NOT DETECTED   Escherichia coli NOT DETECTED NOT DETECTED   Klebsiella aerogenes NOT DETECTED NOT DETECTED   Klebsiella oxytoca NOT DETECTED NOT DETECTED   Klebsiella pneumoniae NOT DETECTED NOT DETECTED   Proteus species NOT DETECTED NOT DETECTED   Salmonella species NOT DETECTED NOT DETECTED   Serratia marcescens NOT DETECTED NOT DETECTED   Haemophilus influenzae NOT DETECTED NOT DETECTED   Neisseria meningitidis NOT DETECTED NOT DETECTED   Pseudomonas aeruginosa NOT DETECTED  NOT DETECTED   Stenotrophomonas maltophilia NOT DETECTED NOT DETECTED   Candida albicans NOT DETECTED NOT DETECTED   Candida auris NOT DETECTED NOT DETECTED   Candida glabrata NOT DETECTED NOT DETECTED   Candida krusei NOT DETECTED NOT DETECTED   Candida parapsilosis NOT DETECTED NOT DETECTED   Candida tropicalis NOT DETECTED NOT DETECTED   Cryptococcus neoformans/gattii NOT DETECTED NOT DETECTED    Angel Keller 07/28/2020  8:02 PM

## 2020-07-28 NOTE — Evaluation (Signed)
Physical Therapy Evaluation Patient Details Name: Angel SMICK Sr. MRN: [id number] DOB: 12-31-1940 Today's Date: 07/28/2020   History of Present Illness  79yo male c/o progressive SOB, fatigue, cough. Hypoxic in the ED on RA, and tested positive for covid. Admitted with acute hypoxic respiratory failure secondary to Covid pneumonia. PMH aneurysm, HLD, HTN, carpal tunnel release, elbow fracture, B rotator cuff repair  Clinical Impression   Patient received in bed, rather surprised to see PT and to learn about need for therapy with Covid, states "I'll be able to whip myself back into shape when I can get back in the gym, I'll be fine.". Pleasant and polite, but needed a lot of encouragement and education for participation in session. Able to mobilize with Min-ModA and HHA of one person (declined using RW today), and demonstrates strong posterior lean with dynamic tasks in standing. Tolerated lateral sidesteps alongside EOB with min-modA and noted difficulty in weight shifting for lateral side stepping. SpO2 desat to 84% with dynamic activities, able to recover well with seated rest/PLB, HR and RR WNL. Left in bed positioned to comfort with all needs met, bed alarm active and RN aware of patient status. Would really benefit from intensive therapies in the CIR setting moving forward.     Follow Up Recommendations CIR;Supervision for mobility/OOB    Equipment Recommendations  Rolling walker with 5" wheels;3in1 (PT)    Recommendations for Other Services       Precautions / Restrictions Precautions Precautions: Fall;Other (comment) Precaution Comments: watch sats, Covid + Restrictions Weight Bearing Restrictions: No      Mobility  Bed Mobility Overal bed mobility: Needs Assistance Bed Mobility: Supine to Sit;Sit to Supine     Supine to sit: Min assist;HOB elevated Sit to supine: Min guard   General bed mobility comments: MinA for BLE management getting OOB, min guard for return to  supine and assist for line management    Transfers Overall transfer level: Needs assistance Equipment used: 1 person hand held assist Transfers: Sit to/from Stand Sit to Stand: Min assist;Mod assist         General transfer comment: ModA to come to upright standing on first attempt with heavy assist for balance due to strong posterior lean; only needed MinA on following attempts but still with posterior bias/unsteadiness  Ambulation/Gait     Assistive device: 1 person hand held assist       General Gait Details: able to take lateral side steps alongside EOB with MinA; desat to 84% and needed seated rest/coached PLB to recover  Stairs            Wheelchair Mobility    Modified Rankin (Stroke Patients Only)       Balance Overall balance assessment: Needs assistance Sitting-balance support: Feet supported;Bilateral upper extremity supported Sitting balance-Leahy Scale: Good     Standing balance support: No upper extremity supported;During functional activity Standing balance-Leahy Scale: Poor Standing balance comment: strong posterior lean and tendency to lose balance backwards                             Pertinent Vitals/Pain Pain Assessment: No/denies pain    Home Living Family/patient expects to be discharged to:: Private residence Living Arrangements: Children Available Help at Discharge: Family;Available 24 hours/day Type of Home: House Home Access: Stairs to enter Entrance Stairs-Rails: Can reach both Entrance Stairs-Number of Steps: 2 on deck Home Layout: One level Home Equipment: None  Prior Function Level of Independence: Independent         Comments: very independent and usually has a workout routine 3 days per week at the gym including strength training     Hand Dominance        Extremity/Trunk Assessment   Upper Extremity Assessment Upper Extremity Assessment: Defer to OT evaluation    Lower Extremity  Assessment Lower Extremity Assessment: Generalized weakness    Cervical / Trunk Assessment Cervical / Trunk Assessment: Kyphotic  Communication   Communication: No difficulties  Cognition Arousal/Alertness: Awake/alert Behavior During Therapy: WFL for tasks assessed/performed Overall Cognitive Status: Within Functional Limits for tasks assessed                                 General Comments: polite and follows cues well, but definitely with severe lack of insight into impairments and covid recovery- keeps telling me that he doesn't feel like he needs PT because "I can get myself back up and running in the gym when I get back to working out just fine"      General Comments General comments (skin integrity, edema, etc.): Spo2 desat to as low as 84% on 2LPM but recovered well with seated rest and PLB, HR and RR WNL    Exercises     Assessment/Plan    PT Assessment Patient needs continued PT services  PT Problem List Decreased strength;Decreased knowledge of use of DME;Decreased activity tolerance;Decreased safety awareness;Decreased balance;Decreased knowledge of precautions;Decreased mobility;Decreased coordination;Cardiopulmonary status limiting activity       PT Treatment Interventions DME instruction;Balance training;Gait training;Stair training;Functional mobility training;Patient/family education;Therapeutic activities;Therapeutic exercise    PT Goals (Current goals can be found in the Care Plan section)  Acute Rehab PT Goals Patient Stated Goal: just go back to the gym and get myself back in shape PT Goal Formulation: With patient Time For Goal Achievement: 08/11/20 Potential to Achieve Goals: Fair    Frequency Min 4X/week   Barriers to discharge        Co-evaluation               AM-PAC PT "6 Clicks" Mobility  Outcome Measure Help needed turning from your back to your side while in a flat bed without using bedrails?: A Little Help needed  moving from lying on your back to sitting on the side of a flat bed without using bedrails?: A Little Help needed moving to and from a bed to a chair (including a wheelchair)?: A Lot Help needed standing up from a chair using your arms (e.g., wheelchair or bedside chair)?: A Lot Help needed to walk in hospital room?: A Lot Help needed climbing 3-5 steps with a railing? : A Lot 6 Click Score: 14    End of Session Equipment Utilized During Treatment: Oxygen Activity Tolerance: Patient tolerated treatment well;Patient limited by fatigue Patient left: in bed;with call bell/phone within reach;with bed alarm set Nurse Communication: Mobility status PT Visit Diagnosis: Unsteadiness on feet (R26.81);Difficulty in walking, not elsewhere classified (R26.2);Muscle weakness (generalized) (M62.81)    Time: 2725-3664 PT Time Calculation (min) (ACUTE ONLY): 25 min   Charges:   PT Evaluation $PT Eval Moderate Complexity: 1 Mod PT Treatments $Therapeutic Activity: 8-22 mins        Windell Norfolk, DPT, PN1   Supplemental Physical Therapist [place]    Pager [phone] Acute Rehab Office [phone]

## 2020-07-28 NOTE — Progress Notes (Signed)
Lower extremity venous has been completed.   Preliminary results in CV Proc.   Abram Sander 07/28/2020 11:44 AM

## 2020-07-28 NOTE — Progress Notes (Signed)
ANTICOAGULATION CONSULT NOTE - Follow Up Consult  Pharmacy Consult for Lovenox Indication: COVID and DVT  No Known Allergies  Patient Measurements: Height: 5\' 10"  (177.8 cm) Weight: 74.8 kg (165 lb) IBW/kg (Calculated) : 73  Vital Signs: Temp: 98.1 F (36.7 C) (12/14 1100) Temp Source: Oral (12/14 1100) BP: 140/90 (12/14 1100) Pulse Rate: 60 (12/14 1100)  Labs: Recent Labs    07/27/20 1327 07/28/20 0111 07/28/20 1238  HGB 14.1 12.3* 12.8*  HCT 41.8 36.4* 38.2*  PLT 322 288 305  CREATININE 1.50* 1.14  --     Estimated Creatinine Clearance: 53.4 mL/min (by C-G formula based on SCr of 1.14 mg/dL).  Assessment: 79 year old male with COVID to begin Lovenox for DVT treatment  Goal of Therapy:  Anti-Xa level 0.6-1 units/ml 4hrs after LMWH dose given Monitor platelets by anticoagulation protocol: Yes   Plan:  Lovenox 30 mg iv x 1 dose now to make 65 mg dose this AM Lovenox 75 mg sq Q 12 hours - to begin tonight  Thank you Anette Guarneri, PharmD  Tad Moore 07/28/2020,1:32 PM

## 2020-07-28 NOTE — Progress Notes (Signed)
PROGRESS NOTE                                                                                                                                                                                                             Patient Demographics:    Angel Keller, is a 79 y.o. male, DOB - 03-10-1941, LYY:[id number]  Outpatient Primary MD for the patient is Timoteo Gaul, FNP    LOS - 1  Admit date - 07/27/2020    Chief Complaint  Patient presents with  . Shortness of Breath  . Cough  . Fatigue       Brief Narrative (HPI from H&P)   Angel Poot. is a 79 y.o. male with medical history significant of hypertension, hyperlipidemia, and hypothyroidism came to ED with worsening shortness of breath, fatigue and cough for 1-2 week.  Patient recently saw his PCP for upper respiratory symptoms and was given prednisone and Zithromax.  To get worse and finally was diagnosed with acute hypoxic respiratory failure due to COVID-19 pneumonia along with severe dehydration and AKI admitted to the hospital   Subjective:    Angel Keller today has, No headache, No chest pain, No abdominal pain - No Nausea, No new weakness tingling or numbness, mild SOB   Assessment  & Plan :     1. Acute Hypoxic Resp. Failure due to Acute Covid 19 Viral Pneumonitis during the ongoing 2020 Covid 19 Pandemic - he is vaccinated according to H&P, seems to have breakthrough infection, currently mild to moderate parenchymal injury.  Placed on IV steroids and Remdesivir.  Continue to monitor.  He has consented for Actemra use if he gets worse.  He is not septic.  Encouraged the patient to sit up in chair in the daytime use I-S and flutter valve for pulmonary toiletry and then prone in bed when at night.  Will advance activity and titrate down oxygen as possible.  Actemra/Baricitinib  off label use - patient was told that if COVID-19 pneumonitis gets worse we might  potentially use Actemra off label, patient denies any known history of active diverticulitis, tuberculosis or hepatitis, understands the risks and benefits and wants to proceed with Actemra treatment if required.     SpO2: 92 % O2 Flow Rate (L/min): 2 L/min  Recent Labs  Lab 07/27/20 1250 07/27/20 1327  07/27/20 1730 07/27/20 1746 07/28/20 0111  WBC  --  10.6*  --   --  6.0  HGB  --  14.1  --   --  12.3*  HCT  --  41.8  --   --  36.4*  PLT  --  322  --   --  288  CRP  --   --  11.0*  --   --   DDIMER  --   --  7.47*  --   --   PROCALCITON  --   --  <0.10  --   --   AST  --  111*  --   --   --   ALT  --  95*  --   --   --   ALKPHOS  --  67  --   --   --   BILITOT  --  1.1  --   --   --   ALBUMIN  --  3.1*  --   --   --   LATICACIDVEN  --   --   --  2.4*  --   SARSCOV2NAA POSITIVE*  --   --   --   --     2.  Elevated D-dimer.  Likely due to inflammation, check leg ultrasound, if hypoxia worsens will check CTA as well for now moderate dose Lovenox weight-based and monitor.   3.  GERD.  On PPI.  4.  Gout.  On allopurinol.  5.  AKI.  Appears prerenal, check renal ultrasound along with urine electrolytes, hydrate, renal function improving.  6.  Dyslipidemia.  On statin.  7. Hypertension.  Resume home dose bisoprolol, hold diuretic.  As needed hydralazine.  8.  Hypothyroidism.  On Synthroid check TSH.  9.  History of depression.  Continue Lexapro.      Condition - Extremely Guarded  Family Communication  :   Daughter Lattie Haw on [phone] -07/28/2020 at 10:02 AM - message left  son Dametri [phone] 07/28/2020 at 10:02 AM.  Mailbox full.  Code Status :  Full  Consults  :  None  Procedures  :   Leg [place] -   PUD Prophylaxis : PPI  Disposition Plan  :    Status is: Inpatient  Remains inpatient appropriate because:IV treatments appropriate due to intensity of illness or inability to take PO   Dispo: The patient is from: Home              Anticipated d/c  is to: Home              Anticipated d/c date is: > 3 days              Patient currently is not medically stable to d/c.  DVT Prophylaxis  :  Lovenox    Lab Results  Component Value Date   PLT 288 07/28/2020    Diet :  Diet Order            Diet Heart Room service appropriate? Yes; Fluid consistency: Thin  Diet effective now                  Inpatient Medications  Scheduled Meds: . allopurinol  300 mg Oral Daily  . vitamin C  500 mg Oral Daily  . aspirin EC  81 mg Oral Daily  . cholecalciferol  2,000 Units Oral Daily  . enoxaparin (LOVENOX) injection  0.5 mg/kg Subcutaneous Q12H  . escitalopram  20 mg Oral Daily  .  levothyroxine  112 mcg Oral Daily  . methylPREDNISolone (SOLU-MEDROL) injection  60 mg Intravenous Q12H  . pantoprazole  40 mg Oral Daily  . pravastatin  20 mg Oral q1800  . sodium chloride flush  3 mL Intravenous Q12H  . vitamin B-12  1,000 mcg Oral Daily  . zinc sulfate  220 mg Oral Daily   Continuous Infusions: . lactated ringers 75 mL/hr at 07/28/20 0608  . remdesivir 100 mg in NS 100 mL     PRN Meds:.acetaminophen, chlorpheniramine-HYDROcodone, guaiFENesin-dextromethorphan, [DISCONTINUED] ondansetron **OR** ondansetron (ZOFRAN) IV, polyethylene glycol  Antibiotics  :    Anti-infectives (From admission, onward)   Start     Dose/Rate Route Frequency Ordered Stop   07/28/20 1000  remdesivir 100 mg in sodium chloride 0.9 % 100 mL IVPB       "Followed by" Linked Group Details   100 mg 200 mL/hr over 30 Minutes Intravenous Daily 07/27/20 1942 08/01/20 0959   07/27/20 1945  remdesivir 200 mg in sodium chloride 0.9% 250 mL IVPB       "Followed by" Linked Group Details   200 mg 580 mL/hr over 30 Minutes Intravenous Once 07/27/20 1942 07/28/20 0010       Time Spent in minutes  30   Lala Lund M.D on 07/28/2020 at 10:01 AM  To page go to [email or web address]   [facility] -  Office  [phone]    See all Orders from today for further  details    Objective:   Vitals:   07/28/20 0745 07/28/20 0830 07/28/20 0900 07/28/20 0940  BP: (!) 146/76 113/64 139/67 (!) 158/80  Pulse: (!) 54 71 100 61  Resp:  14 (!) 23 17  Temp:    98.1 F (36.7 C)  TempSrc:    Oral  SpO2: 94% 90% 98% 92%  Weight:      Height:        Wt Readings from Last 3 Encounters:  07/27/20 74.8 kg  02/20/19 73.9 kg  01/14/19 73.9 kg     Intake/Output Summary (Last 24 hours) at 07/28/2020 1001 Last data filed at 07/27/2020 2145 Gross per 24 hour  Intake 3 ml  Output --  Net 3 ml     Physical Exam  Awake Alert, No new F.N deficits, Normal affect [place].AT,PERRAL Supple Neck,No JVD, No cervical lymphadenopathy appriciated.  Symmetrical Chest wall movement, Good air movement bilaterally, CTAB RRR,No Gallops,Rubs or new Murmurs, No Parasternal Heave +ve B.Sounds, Abd Soft, No tenderness, No organomegaly appriciated, No rebound - guarding or rigidity. No Cyanosis, Clubbing or edema, No new Rash or bruise       Data Review:    CBC Recent Labs  Lab 07/27/20 1327 07/28/20 0111  WBC 10.6* 6.0  HGB 14.1 12.3*  HCT 41.8 36.4*  PLT 322 288  MCV 81.5 81.1  MCH 27.5 27.4  MCHC 33.7 33.8  RDW 13.3 13.3  LYMPHSABS 1.0  --   MONOABS 0.9  --   EOSABS 0.0  --   BASOSABS 0.0  --     Recent Labs  Lab 07/27/20 1327 07/27/20 1730 07/27/20 1746 07/28/20 0111  NA 137  --   --   --   K 4.3  --   --   --   CL 99  --   --   --   CO2 25  --   --   --   GLUCOSE 133*  --   --   --   BUN 34*  --   --   --  CREATININE 1.50*  --   --  1.14  CALCIUM 9.4  --   --   --   AST 111*  --   --   --   ALT 95*  --   --   --   ALKPHOS 67  --   --   --   BILITOT 1.1  --   --   --   ALBUMIN 3.1*  --   --   --   CRP  --  11.0*  --   --   DDIMER  --  7.47*  --   --   PROCALCITON  --  <0.10  --   --   LATICACIDVEN  --   --  2.4*  --      ------------------------------------------------------------------------------------------------------------------ Recent Labs    07/27/20 1730  TRIG 125    No results found for: HGBA1C ------------------------------------------------------------------------------------------------------------------ No results for input(s): TSH, T4TOTAL, T3FREE, THYROIDAB in the last 72 hours.  Invalid input(s): FREET3  Cardiac Enzymes No results for input(s): CKMB, TROPONINI, MYOGLOBIN in the last 168 hours.  Invalid input(s): CK ------------------------------------------------------------------------------------------------------------------ No results found for: BNP  Micro Results Recent Results (from the past 240 hour(s))  Resp Panel by RT-PCR (Flu A&B, Covid) Nasopharyngeal Swab     Status: Abnormal   Collection Time: 07/27/20 12:50 PM   Specimen: Nasopharyngeal Swab; Nasopharyngeal(NP) swabs in vial transport medium  Result Value Ref Range Status   SARS Coronavirus 2 by RT PCR POSITIVE (A) NEGATIVE Final    Comment: emailed L. Berdik RN 14:35 07/27/20 ([email or web address]) (NOTE) SARS-CoV-2 target nucleic acids are DETECTED.  The SARS-CoV-2 RNA is generally detectable in upper respiratory specimens during the acute phase of infection. Positive results are indicative of the presence of the identified virus, but do not rule out bacterial infection or co-infection with other pathogens not detected by the test. Clinical correlation with patient history and other diagnostic information is necessary to determine patient infection status. The expected result is Negative.  Fact Sheet for Patients: EntrepreneurPulse.com.au  Fact Sheet for Healthcare Providers: IncredibleEmployment.be  This test is not yet approved or cleared by the [place] FDA and  has been authorized for detection and/or diagnosis of SARS-CoV-2 by FDA under an Emergency Use Authorization  (EUA).  This EUA will remain in effect (meaning this test can be used) for the duration of  the COVID- 19 declaration under Section 564(b)(1) of the Act, 21 U.S.C. section 360bbb-3(b)(1), unless the authorization is terminated or revoked sooner.     Influenza A by PCR NEGATIVE NEGATIVE Final   Influenza B by PCR NEGATIVE NEGATIVE Final    Comment: (NOTE) The Xpert Xpress SARS-CoV-2/FLU/RSV plus assay is intended as an aid in the diagnosis of influenza from Nasopharyngeal swab specimens and should not be used as a sole basis for treatment. Nasal washings and aspirates are unacceptable for Xpert Xpress SARS-CoV-2/FLU/RSV testing.  Fact Sheet for Patients: EntrepreneurPulse.com.au  Fact Sheet for Healthcare Providers: IncredibleEmployment.be  This test is not yet approved or cleared by the [place] FDA and has been authorized for detection and/or diagnosis of SARS-CoV-2 by FDA under an Emergency Use Authorization (EUA). This EUA will remain in effect (meaning this test can be used) for the duration of the COVID-19 declaration under Section 564(b)(1) of the Act, 21 U.S.C. section 360bbb-3(b)(1), unless the authorization is terminated or revoked.  Performed at [facility], [place] [address], [place], [place] [address]     Radiology Reports DG Chest 2 View  Result Date: 07/27/2020 CLINICAL DATA:  Shortness of breath EXAM: CHEST - 2 VIEW COMPARISON:  03/06/2017 FINDINGS: The heart size and mediastinal contours are within normal limits. Patchy perihilar and bibasilar airspace opacities, left greater than right. No pleural effusion or pneumothorax. The visualized skeletal structures are unremarkable. IMPRESSION: Patchy perihilar and bibasilar airspace opacities, left greater than right. Findings are suspicious for multifocal atypical/viral pneumonia. Electronically Signed   By: Davina Poke D.O.   On: 07/27/2020 12:44

## 2020-07-29 DIAGNOSIS — R0902 Hypoxemia: Secondary | ICD-10-CM

## 2020-07-29 DIAGNOSIS — N179 Acute kidney failure, unspecified: Secondary | ICD-10-CM

## 2020-07-29 LAB — COMPREHENSIVE METABOLIC PANEL
ALT: 98 U/L — ABNORMAL HIGH (ref 0–44)
AST: 100 U/L — ABNORMAL HIGH (ref 15–41)
Albumin: 2.5 g/dL — ABNORMAL LOW (ref 3.5–5.0)
Alkaline Phosphatase: 55 U/L (ref 38–126)
Anion gap: 8 (ref 5–15)
BUN: 25 mg/dL — ABNORMAL HIGH (ref 8–23)
CO2: 25 mmol/L (ref 22–32)
Calcium: 8.8 mg/dL — ABNORMAL LOW (ref 8.9–10.3)
Chloride: 100 mmol/L (ref 98–111)
Creatinine, Ser: 1.01 mg/dL (ref 0.61–1.24)
GFR, Estimated: >60 mL/min (ref >60)
Glucose, Bld: 122 mg/dL — ABNORMAL HIGH (ref 70–99)
Potassium: 4.5 mmol/L (ref 3.5–5.1)
Sodium: 133 mmol/L — ABNORMAL LOW (ref 135–145)
Total Bilirubin: 0.6 mg/dL (ref 0.3–1.2)
Total Protein: 6 g/dL — ABNORMAL LOW (ref 6.5–8.1)

## 2020-07-29 LAB — CULTURE, BLOOD (ROUTINE X 2)

## 2020-07-29 LAB — CBC WITH DIFFERENTIAL/PLATELET
Abs Immature Granulocytes: 0.08 10*3/uL — ABNORMAL HIGH (ref 0.00–0.07)
Basophils Absolute: 0 10*3/uL (ref 0.0–0.1)
Basophils Relative: 0 %
Eosinophils Absolute: 0 10*3/uL (ref 0.0–0.5)
Eosinophils Relative: 0 %
HCT: 36.1 % — ABNORMAL LOW (ref 39.0–52.0)
Hemoglobin: 12 g/dL — ABNORMAL LOW (ref 13.0–17.0)
Immature Granulocytes: 1 %
Lymphocytes Relative: 7 %
Lymphs Abs: 0.6 10*3/uL — ABNORMAL LOW (ref 0.7–4.0)
MCH: 26.8 pg (ref 26.0–34.0)
MCHC: 33.2 g/dL (ref 30.0–36.0)
MCV: 80.8 fL (ref 80.0–100.0)
Monocytes Absolute: 0.5 10*3/uL (ref 0.1–1.0)
Monocytes Relative: 6 %
Neutro Abs: 7.7 10*3/uL (ref 1.7–7.7)
Neutrophils Relative %: 86 %
Platelets: 304 10*3/uL (ref 150–400)
RBC: 4.47 MIL/uL (ref 4.22–5.81)
RDW: 13 % (ref 11.5–15.5)
WBC: 8.9 10*3/uL (ref 4.0–10.5)
nRBC: 0 % (ref 0.0–0.2)

## 2020-07-29 LAB — MAGNESIUM: Magnesium: 2.2 mg/dL (ref 1.7–2.4)

## 2020-07-29 LAB — D-DIMER, QUANTITATIVE: D-Dimer, Quant: 3 ug/mL-FEU — ABNORMAL HIGH (ref 0.00–0.50)

## 2020-07-29 LAB — C-REACTIVE PROTEIN: CRP: 7.9 mg/dL — ABNORMAL HIGH (ref <1.0)

## 2020-07-29 LAB — BRAIN NATRIURETIC PEPTIDE: B Natriuretic Peptide: 138.3 pg/mL — ABNORMAL HIGH (ref 0.0–100.0)

## 2020-07-29 MED ORDER — ENSURE ENLIVE PO LIQD
237.0000 mL | Freq: Two times a day (BID) | ORAL | Status: DC
  Administered 2020-07-29 – 2020-07-31 (×4): 237 mL via ORAL

## 2020-07-29 MED ORDER — HYDRALAZINE HCL 20 MG/ML IJ SOLN
10.0000 mg | Freq: Four times a day (QID) | INTRAMUSCULAR | Status: DC | PRN
  Administered 2020-07-30: 12:00:00 10 mg via INTRAVENOUS
  Filled 2020-07-29 (×2): qty 1

## 2020-07-29 NOTE — Progress Notes (Signed)
Patient is in no distress. Will recheck vitals within the hour. Charge Nurse Vicente Males notified. Will continue to monitor.

## 2020-07-29 NOTE — Evaluation (Signed)
Occupational Therapy Evaluation Patient Details Name: Angel LEICHT Sr. MRN: [id number] DOB: [birth year] Today's Date: 07/29/2020    History of Present Illness 79yo male c/o progressive SOB, fatigue, cough. Hypoxic in the ED on RA, and tested positive for covid. Admitted with acute hypoxic respiratory failure secondary to Covid pneumonia. PMH aneurysm, HLD, HTN, carpal tunnel release, elbow fracture, B rotator cuff repair   Clinical Impression   PTA pt living with daughter and functioning at independent community level. Pt shares that he exercises at the gym 3x per week, drives, and remains quite active. At time of eval, pt up in chair on 2L [place]. He was able to complete sit <> stands with min guard assist. He then complete >500 ft of mobility in hallway on 4L [place] to maintain SpO2 >88%. Educated pt on pursed lip breathing, rest breaks, and pacing self. Issued pt incentive spirometer and provided training for consistent usage. Given current status, currently recommend HHOT at d/c- but pt may progress without. Will continue to follow per POC listed below.     Follow Up Recommendations  Home health OT;Supervision - Intermittent (may progress without)   Equipment Recommendations  Tub/shower seat    Recommendations for Other Services       Precautions / Restrictions Precautions Precautions: Fall Restrictions Weight Bearing Restrictions: (P) No      Mobility Bed Mobility               General bed mobility comments: up in chair, returned to chair    Transfers Overall transfer level: Needs assistance Equipment used: 1 person hand held assist Transfers: Sit to/from Stand Sit to Stand: Min guard         General transfer comment: close guarding for safety and line management    Balance Overall balance assessment: Needs assistance Sitting-balance support: Feet supported;Bilateral upper extremity supported Sitting balance-Leahy Scale: Good     Standing balance support: No  upper extremity supported;During functional activity Standing balance-Leahy Scale: Fair Standing balance comment: high guarded posture. More supportive with some UE support                           ADL either performed or assessed with clinical judgement   ADL Overall ADL's : Needs assistance/impaired Eating/Feeding: Independent;Sitting   Grooming: Supervision/safety;Standing   Upper Body Bathing: Set up;Sitting   Lower Body Bathing: Min guard;Sitting/lateral leans;Sit to/from stand   Upper Body Dressing : Set up;Sitting   Lower Body Dressing: Min guard;Sitting/lateral leans;Sit to/from stand   Toilet Transfer: Min guard;Ambulation;Regular Toilet;Grab bars   Toileting- Clothing Manipulation and Hygiene: Set up;Sitting/lateral lean       Functional mobility during ADLs: Min guard;Cueing for safety       Vision Baseline Vision/History: No visual deficits Patient Visual Report: No change from baseline       Perception     Praxis      Pertinent Vitals/Pain Pain Assessment: No/denies pain     Hand Dominance     Extremity/Trunk Assessment Upper Extremity Assessment Upper Extremity Assessment: Overall WFL for tasks assessed   Lower Extremity Assessment Lower Extremity Assessment: Defer to PT evaluation       Communication Communication Communication: No difficulties   Cognition Arousal/Alertness: Awake/alert Behavior During Therapy: WFL for tasks assessed/performed Overall Cognitive Status: No family/caregiver present to determine baseline cognitive functioning Area of Impairment: Safety/judgement  Safety/Judgement: Decreased awareness of safety;Decreased awareness of deficits     General Comments: overall decreased awareness of current functional status and trajectory of illness. Requires cues as to why therapy is important   General Comments       Exercises Other Exercises Other Exercises: IS training:  ~1750 mL with good technique and min coughing   Shoulder Instructions      Home Living Family/patient expects to be discharged to:: Private residence Living Arrangements: Children Available Help at Discharge: Family;Available 24 hours/day Type of Home: House Home Access: Stairs to enter [employer] of Steps: 2 on deck Entrance Stairs-Rails: Can reach both Home Layout: One level     Bathroom Shower/Tub: Teacher, early years/pre: Standard     Home Equipment: None          Prior Functioning/Environment Level of Independence: Independent        Comments: very independent and usually has a workout routine 3 days per week at the gym including strength training        OT Problem List: Decreased strength;Decreased knowledge of use of DME or AE;Decreased knowledge of precautions;Decreased activity tolerance;Cardiopulmonary status limiting activity;Impaired balance (sitting and/or standing)      OT Treatment/Interventions: Self-care/ADL training;Therapeutic exercise;Patient/family education;Balance training;Energy conservation;Therapeutic activities;DME and/or AE instruction    OT Goals(Current goals can be found in the care plan section) Acute Rehab OT Goals Patient Stated Goal: get back to independent self- going to gym OT Goal Formulation: With patient Time For Goal Achievement: 08/12/20 Potential to Achieve Goals: Good  OT Frequency: Min 3X/week   Barriers to D/C:            Co-evaluation              AM-PAC OT "6 Clicks" Daily Activity     Outcome Measure Help from another person eating meals?: None Help from another person taking care of personal grooming?: None Help from another person toileting, which includes using toliet, bedpan, or urinal?: A Little Help from another person bathing (including washing, rinsing, drying)?: A Little Help from another person to put on and taking off regular upper body clothing?: None Help from another  person to put on and taking off regular lower body clothing?: A Little 6 Click Score: 21   End of Session Equipment Utilized During Treatment: Oxygen Nurse Communication: Mobility status  Activity Tolerance: Patient tolerated treatment well Patient left: in chair  OT Visit Diagnosis: Unsteadiness on feet (R26.81);Other abnormalities of gait and mobility (R26.89);Muscle weakness (generalized) (M62.81)                Time: 6553-7482 OT Time Calculation (min): 60 min Charges:  OT General Charges $OT Visit: 1 Visit OT Evaluation $OT Eval Moderate Complexity: 1 Mod OT Treatments $Self Care/Home Management : 38-52 mins  Zenovia Jarred, MSOT, OTR/L Acute Rehabilitation Services [facility] Office Number: [phone] Pager: [phone]  Zenovia Jarred 07/29/2020, 12:55 PM

## 2020-07-29 NOTE — Progress Notes (Signed)
Vital signs are stable and MEWS score is back in green. Charge Nurse Vicente Males notified. Will continue to monitor.

## 2020-07-29 NOTE — TOC Progression Note (Signed)
Transition of Care (TOC) - Progression Note    Patient Details  Name: Angel KALMAN Sr. MRN: [id number] Date of Birth: 06/25/1941  [facility] CM/SW Contact  Carles Collet, RN Phone Number: 07/29/2020, 3:31 PM  Clinical Narrative:   PT and OT worked w patient again today, he is making great progress. They discussed possible disposition. Home w home health is anticipated.          Expected Discharge Plan and Services                                                 Social Determinants of Health (SDOH) Interventions    Readmission Risk Interventions No flowsheet data found.

## 2020-07-29 NOTE — Progress Notes (Signed)
Initial Nutrition Assessment  DOCUMENTATION CODES:   Not applicable  INTERVENTION:  Provide Ensure Enlive po BID, each supplement provides 350 kcal and 20 grams of protein  Encourage adequate PO intake.   NUTRITION DIAGNOSIS:   Increased nutrient needs related to catabolic illness (COVID) as evidenced by estimated needs.  GOAL:   Patient will meet greater than or equal to 90% of their needs  MONITOR:   PO intake,Supplement acceptance,Skin,Weight trends,Labs,I & O's  REASON FOR ASSESSMENT:   Malnutrition Screening Tool    ASSESSMENT:   79 y.o. male with medical history significant of hypertension, hyperlipidemia, and hypothyroidism presents with shortness of breath, cough and fatigue. Pt diagnosed with acute hypoxic respiratory failure due to COVID-19 pneumonia along with severe dehydration and AKI.  Pt is currently on 2 L nasal cannula. Meal completion has been 75-100%. Pt has been tolerating his po diet well. RD to order nutritional supplements to aid in caloric and protein needs. Pt encouraged to eat his food at meals and to drink his supplements. Unable to complete Nutrition-Focused physical exam at this time. Labs and medications reviewed.   Diet Order:   Diet Order            Diet Heart Room service appropriate? Yes; Fluid consistency: Thin  Diet effective now                 EDUCATION NEEDS:   Not appropriate for education at this time  Skin:  Skin Assessment: Reviewed RN Assessment  Last BM:  Unknown  Height:   Ht Readings from Last 1 Encounters:  07/27/20 5\' 10"  (1.778 m)    Weight:   Wt Readings from Last 1 Encounters:  07/27/20 74.8 kg   BMI:  Body mass index is 23.68 kg/m.  Estimated Nutritional Needs:   Kcal:  1800-2000  Protein:  85-100 grams  Fluid:  >/= 1.8 L/day  Corrin Parker, MS, RD, LDN RD pager number/after hours weekend pager number on Amion.

## 2020-07-29 NOTE — TOC Benefit Eligibility Note (Signed)
Transition of Care [facility]) Benefit Eligibility Note    Patient Details  Name: Angel MUSICK Sr. MRN: [id number] Date of Birth: Feb 03, 1941   Medication/Dose: 1. Xarelto / 2. Eliquis  Covered?: Yes     Prescription Coverage Preferred Pharmacy: [facility] [place] with Person/Company/Phone Number:: [employer]  Co-Pay: $47.00 for 30 day retail (both medications have the same co-pay)  Prior Approval: No      Angel Keller Phone Number: 07/29/2020, 11:53 AM

## 2020-07-29 NOTE — Progress Notes (Signed)
[facility]                                                                              Patient Demographics  Angel Keller, is a 79 y.o. male, DOB - [birth year], WUX:[id number]  Admit date - 07/27/2020   Admitting Physician Lorella Nimrod, MD  Outpatient Primary MD for the patient is Timoteo Gaul, FNP  Outpatient specialists:   LOS - 2  days   Medical records reviewed and are as summarized below:    Chief Complaint  Patient presents with  . Shortness of Breath  . Cough  . Fatigue       Brief summary   Angel Kelleris a 79 y.o.malewith medical history significant ofhypertension, hyperlipidemia, and hypothyroidism came to ED with worsening shortness of breath, fatigue and cough for 1-2week. Patient recently saw his PCP for upper respiratory symptoms and was given prednisone and Zithromax.  To get worse and finally was diagnosed with acute hypoxic respiratory failure due to COVID-19 pneumonia along with severe dehydration and AKI admitted to the hospital   Assessment & Plan     Acute hypoxic respiratory failure due to acute COVID-19 viral pneumonia during the ongoing COVID-19 pandemic- POA - Patient presented with worsening shortness of breath, fatigue and cough for 1 to 2 weeks PTA, diagnosed with COVID-19 pneumonia along with severe dehydration and AKI.  Vaccinated according to the H&P.  - Continue IV Solu-Medrol, remdesivir per pharmacy protocol.  Patient has consented for Actemra use if he gets worse. - Continue Supportive care: vitamin C/zinc, albuterol, Tylenol. - Continue to wean oxygen, ambulatory O2 screening daily as tolerated  - Oxygen - SpO2: 90 % O2 Flow Rate (L/min): 2 L/min - Continue to follow labs as below  Lab Results  Component Value Date   SARSCOV2NAA POSITIVE (A) 07/27/2020   [place] NEGATIVE 02/18/2019     Recent Labs  Lab 07/27/20 1327 07/27/20 1730 07/28/20 1238 07/29/20 0034  DDIMER  --  7.47* 3.10* 3.00*   FERRITIN  --  147  --   --   CRP  --  11.0* 10.7* 7.9*  ALT 95*  --  85* 98*  PROCALCITON  --  <0.10  --   --      Elevated D-dimer, +DVT -Likely due to COVID-19, venous Dopplers lower extremity 07/28/2020 showed age indeterminate DVT involving the right popliteal vein -Continue Lovenox and TOC consulted for benefits check for eliquis -If worsening of hypoxia, will check CTA  GERD Continue PPI  Gout Continue allopurinol  Acute kidney injury, dehydration, lactic acidosis -Creatinine 1.5 at the time of admission, lactic acid 2.4 -Renal ultrasound negative for any obstructive uropathy, appears prerenal -Improving, creatinine 1.0  Dyslipidemia Continue statin  Essential hypertension - continue bisoprolol, as needed hydralazine  Generalized debility PT OT evaluation recommended CIR, consult placed  Code Status: Full CODE STATUS DVT Prophylaxis:  Lovenox  Family Communication: Discussed all imaging results, lab results, explained to the patient. Called patient's daughter Angel Keller [phone], unable to contact, left voicemail message  Disposition Plan:     Status is: Inpatient  Remains inpatient appropriate because:Inpatient level of care appropriate  due to severity of illness   Dispo: The patient is from: Home              Anticipated d/c is to: CIR              Anticipated d/c date is: 2 days              Patient currently is not medically stable to d/c.      Time Spent in minutes   35 minutes  Procedures:  Doppler ultrasound lower extremity  Consultants:   None  Antimicrobials:   Anti-infectives (From admission, onward)   Start     Dose/Rate Route Frequency Ordered Stop   07/28/20 1000  remdesivir 100 mg in sodium chloride 0.9 % 100 mL IVPB       "Followed by" Linked Group Details   100 mg 200 mL/hr over 30 Minutes Intravenous Daily 07/27/20 1942 08/01/20 0959   07/27/20 1945  remdesivir 200 mg in sodium chloride 0.9% 250 mL IVPB       "Followed by"  Linked Group Details   200 mg 580 mL/hr over 30 Minutes Intravenous Once 07/27/20 1942 07/28/20 2315          Medications  Scheduled Meds: . allopurinol  300 mg Oral Daily  . vitamin C  500 mg Oral Daily  . aspirin EC  81 mg Oral Daily  . bisoprolol  5 mg Oral Daily  . cholecalciferol  2,000 Units Oral Daily  . enoxaparin (LOVENOX) injection  75 mg Subcutaneous Q12H  . escitalopram  20 mg Oral Daily  . levothyroxine  112 mcg Oral Daily  . methylPREDNISolone (SOLU-MEDROL) injection  60 mg Intravenous Q12H  . pantoprazole  40 mg Oral Daily  . pravastatin  20 mg Oral q1800  . sodium chloride flush  3 mL Intravenous Q12H  . vitamin B-12  1,000 mcg Oral Daily  . zinc sulfate  220 mg Oral Daily   Continuous Infusions: . lactated ringers 75 mL/hr at 07/29/20 1228  . remdesivir 100 mg in NS 100 mL 100 mg (07/29/20 0807)   PRN Meds:.acetaminophen, chlorpheniramine-HYDROcodone, guaiFENesin-dextromethorphan, [DISCONTINUED] ondansetron **OR** ondansetron (ZOFRAN) IV, polyethylene glycol      Subjective:   [name] was seen and examined today.  No acute complaints, sitting up in the chair.  No  fevers or chills.  Shortness of breath is improving.  Patient denies dizziness, chest pain, abdominal pain, N/V/D/C, new weakness, numbess, tingling. No acute events overnight.    Objective:   Vitals:   07/28/20 1943 07/29/20 0500 07/29/20 0546 07/29/20 0800  BP: 111/66 130/75 131/70 (!) 171/75  Pulse: (!) 55 (!) 46 (!) 45 (!) 51  Resp: 15 (!) 23 19 (!) 22  Temp: 98.1 F (36.7 C) 98 F (36.7 C) 98 F (36.7 C) 98 F (36.7 C)  TempSrc: Axillary Axillary Axillary Oral  SpO2: 95% 94% 91% 90%  Weight:      Height:        Intake/Output Summary (Last 24 hours) at 07/29/2020 1336 Last data filed at 07/29/2020 1334 Gross per 24 hour  Intake 1236.6 ml  Output 1750 ml  Net -513.4 ml     Wt Readings from Last 3 Encounters:  07/27/20 74.8 kg  02/20/19 73.9 kg  01/14/19 73.9 kg      Exam  General: Alert and oriented x 3, NAD  Cardiovascular: S1 S2 auscultated, no murmurs, RRR  Respiratory: Clear to auscultation bilaterally, no wheezing, rales or rhonchi  Gastrointestinal:  Soft, nontender, nondistended, + bowel sounds  Ext: no pedal edema bilaterally  Neuro: no new deficits  Musculoskeletal: No digital cyanosis, clubbing  Skin: No rashes  Psych: Normal affect and demeanor, alert and oriented x3    Data Reviewed:  I have personally reviewed following labs and imaging studies  Micro Results Recent Results (from the past 240 hour(s))  Resp Panel by RT-PCR (Flu A&B, Covid) Nasopharyngeal Swab     Status: Abnormal   Collection Time: 07/27/20 12:50 PM   Specimen: Nasopharyngeal Swab; Nasopharyngeal(NP) swabs in vial transport medium  Result Value Ref Range Status   SARS Coronavirus 2 by RT PCR POSITIVE (A) NEGATIVE Final    Comment: emailed L. Berdik RN 14:35 07/27/20 ([email or web address]) (NOTE) SARS-CoV-2 target nucleic acids are DETECTED.  The SARS-CoV-2 RNA is generally detectable in upper respiratory specimens during the acute phase of infection. Positive results are indicative of the presence of the identified virus, but do not rule out bacterial infection or co-infection with other pathogens not detected by the test. Clinical correlation with patient history and other diagnostic information is necessary to determine patient infection status. The expected result is Negative.  Fact Sheet for Patients: EntrepreneurPulse.com.au  Fact Sheet for Healthcare Providers: IncredibleEmployment.be  This test is not yet approved or cleared by the [place] FDA and  has been authorized for detection and/or diagnosis of SARS-CoV-2 by FDA under an Emergency Use Authorization (EUA).  This EUA will remain in effect (meaning this test can be used) for the duration of  the COVID- 19 declaration under Section 564(b)(1) of the Act,  21 U.S.C. section 360bbb-3(b)(1), unless the authorization is terminated or revoked sooner.     Influenza A by PCR NEGATIVE NEGATIVE Final   Influenza B by PCR NEGATIVE NEGATIVE Final    Comment: (NOTE) The Xpert Xpress SARS-CoV-2/FLU/RSV plus assay is intended as an aid in the diagnosis of influenza from Nasopharyngeal swab specimens and should not be used as a sole basis for treatment. Nasal washings and aspirates are unacceptable for Xpert Xpress SARS-CoV-2/FLU/RSV testing.  Fact Sheet for Patients: EntrepreneurPulse.com.au  Fact Sheet for Healthcare Providers: IncredibleEmployment.be  This test is not yet approved or cleared by the [place] FDA and has been authorized for detection and/or diagnosis of SARS-CoV-2 by FDA under an Emergency Use Authorization (EUA). This EUA will remain in effect (meaning this test can be used) for the duration of the COVID-19 declaration under Section 564(b)(1) of the Act, 21 U.S.C. section 360bbb-3(b)(1), unless the authorization is terminated or revoked.  Performed at [facility], [place] [address] [place], [place] [address]   Blood Culture (routine x 2)     Status: Abnormal   Collection Time: 07/27/20  5:30 PM   Specimen: BLOOD  Result Value Ref Range Status   Specimen Description BLOOD SITE NOT SPECIFIED  Final   Special Requests   Final    BOTTLES DRAWN AEROBIC AND ANAEROBIC Blood Culture results may not be optimal due to an inadequate volume of blood received in culture bottles   Culture  Setup Time   Final    GRAM POSITIVE COCCI IN CLUSTERS AEROBIC BOTTLE ONLY Organism ID to follow CRITICAL RESULT CALLED TO, READ BACK BY AND VERIFIED WITH: PHARMD H VAN DOHLEN 07/28/20 AT 1958 SK    Culture (A)  Final    STAPHYLOCOCCUS HOMINIS THE SIGNIFICANCE OF ISOLATING THIS ORGANISM FROM A SINGLE SET OF BLOOD CULTURES WHEN MULTIPLE SETS ARE DRAWN IS UNCERTAIN. PLEASE NOTIFY THE MICROBIOLOGY  DEPARTMENT WITHIN ONE WEEK IF SPECIATION AND SENSITIVITIES ARE REQUIRED. Performed at [facility], [place] [address] [place], [place] [address]    Report Status 07/29/2020 FINAL  Final  Blood Culture ID Panel (Reflexed)     Status: Abnormal   Collection Time: 07/27/20  5:30 PM  Result Value Ref Range Status   Enterococcus faecalis NOT DETECTED NOT DETECTED Final   Enterococcus Faecium NOT DETECTED NOT DETECTED Final   Listeria monocytogenes NOT DETECTED NOT DETECTED Final   Staphylococcus species DETECTED (A) NOT DETECTED Final    Comment: CRITICAL RESULT CALLED TO, READ BACK BY AND VERIFIED WITH: PHARMD H VAN DOHLEN 07/28/20 AT 1958 SK    Staphylococcus aureus (BCID) NOT DETECTED NOT DETECTED Final   Staphylococcus epidermidis NOT DETECTED NOT DETECTED Final   Staphylococcus lugdunensis NOT DETECTED NOT DETECTED Final   Streptococcus species NOT DETECTED NOT DETECTED Final   Streptococcus agalactiae NOT DETECTED NOT DETECTED Final   Streptococcus pneumoniae NOT DETECTED NOT DETECTED Final   Streptococcus pyogenes NOT DETECTED NOT DETECTED Final   A.calcoaceticus-baumannii NOT DETECTED NOT DETECTED Final   Bacteroides fragilis NOT DETECTED NOT DETECTED Final   Enterobacterales NOT DETECTED NOT DETECTED Final   Enterobacter cloacae complex NOT DETECTED NOT DETECTED Final   Escherichia coli NOT DETECTED NOT DETECTED Final   Klebsiella aerogenes NOT DETECTED NOT DETECTED Final   Klebsiella oxytoca NOT DETECTED NOT DETECTED Final   Klebsiella pneumoniae NOT DETECTED NOT DETECTED Final   Proteus species NOT DETECTED NOT DETECTED Final   Salmonella species NOT DETECTED NOT DETECTED Final   Serratia marcescens NOT DETECTED NOT DETECTED Final   Haemophilus influenzae NOT DETECTED NOT DETECTED Final   Neisseria meningitidis NOT DETECTED NOT DETECTED Final   Pseudomonas aeruginosa NOT DETECTED NOT DETECTED Final   Stenotrophomonas maltophilia NOT DETECTED NOT DETECTED Final   Candida  albicans NOT DETECTED NOT DETECTED Final   Candida auris NOT DETECTED NOT DETECTED Final   Candida glabrata NOT DETECTED NOT DETECTED Final   Candida krusei NOT DETECTED NOT DETECTED Final   Candida parapsilosis NOT DETECTED NOT DETECTED Final   Candida tropicalis NOT DETECTED NOT DETECTED Final   Cryptococcus neoformans/gattii NOT DETECTED NOT DETECTED Final    Comment: Performed at [facility], [address] [place], [place] [address]  Blood Culture (routine x 2)     Status: None (Preliminary result)   Collection Time: 07/27/20  5:35 PM   Specimen: BLOOD LEFT ARM  Result Value Ref Range Status   Specimen Description BLOOD LEFT ARM  Final   Special Requests   Final    BOTTLES DRAWN AEROBIC AND ANAEROBIC Blood Culture results may not be optimal due to an excessive volume of blood received in culture bottles   Culture   Final    NO GROWTH 2 DAYS Performed at [facility], [place] [address], [place], [place] [address]    Report Status PENDING  Incomplete  MRSA PCR Screening     Status: None   Collection Time: 07/28/20 12:14 PM   Specimen: Nasopharyngeal  Result Value Ref Range Status   MRSA by PCR NEGATIVE NEGATIVE Final    Comment:        The GeneXpert MRSA Assay (FDA approved for NASAL specimens only), is one component of a comprehensive MRSA colonization surveillance program. It is not intended to diagnose MRSA infection nor to guide or monitor treatment for MRSA infections. Performed at [facility], [place],  [place] [address]     Radiology Reports DG Chest 2 View  Result Date: 07/27/2020 CLINICAL DATA:  Shortness of breath EXAM: CHEST - 2 VIEW COMPARISON:  03/06/2017 FINDINGS: The heart size and mediastinal contours are within normal limits. Patchy perihilar and bibasilar airspace opacities, left greater than right. No pleural effusion or pneumothorax. The visualized skeletal structures are unremarkable. IMPRESSION: Patchy perihilar and  bibasilar airspace opacities, left greater than right. Findings are suspicious for multifocal atypical/viral pneumonia. Electronically Signed   By: Davina Poke D.O.   On: 07/27/2020 12:44   US RENAL  Result Date: 07/28/2020 CLINICAL DATA:  Acute kidney injury. EXAM: RENAL / URINARY TRACT ULTRASOUND COMPLETE COMPARISON:  None. FINDINGS: Right Kidney: Renal measurements: 10.3 x 4.0 x 5.2 cm = volume: 112 mL. Echogenicity within normal limits. No mass or hydronephrosis visualized. Left Kidney: Renal measurements: 10.3 x 5.2 x 4.3 cm = volume: 122 mL. Echogenicity within normal limits. No mass or hydronephrosis visualized. Bladder: Not visualized on the current exam. Other: None. IMPRESSION: 1. Unremarkable sonographic appearance of the kidneys. 2. Urinary bladder not visualized, may be decompressed. Electronically Signed   By: Keith Rake M.D.   On: 07/28/2020 16:32   VAS [place] LOWER EXTREMITY VENOUS (DVT)  Result Date: 07/28/2020  Lower Venous DVT Study Indications: Covid, rapidly rising ddimer.  Comparison Study: no prior Performing Technologist: Abram Sander RVS  Examination Guidelines: A complete evaluation includes B-mode imaging, spectral Doppler, color Doppler, and power Doppler as needed of all accessible portions of each vessel. Bilateral testing is considered an integral part of a complete examination. Limited examinations for reoccurring indications may be performed as noted. The reflux portion of the exam is performed with the patient in reverse Trendelenburg.  +---------+---------------+---------+-----------+----------+-----------------+ RIGHT    CompressibilityPhasicitySpontaneityPropertiesThrombus Aging    +---------+---------------+---------+-----------+----------+-----------------+ CFV      Full           Yes      Yes                                    +---------+---------------+---------+-----------+----------+-----------------+ SFJ      Full                                                            +---------+---------------+---------+-----------+----------+-----------------+ FV Prox  Full                                                           +---------+---------------+---------+-----------+----------+-----------------+ FV Mid   Full                                                           +---------+---------------+---------+-----------+----------+-----------------+ FV DistalFull                                                           +---------+---------------+---------+-----------+----------+-----------------+  PFV      Full                                                           +---------+---------------+---------+-----------+----------+-----------------+ POP      None           No       No                   Age Indeterminate +---------+---------------+---------+-----------+----------+-----------------+ PTV      Full                                                           +---------+---------------+---------+-----------+----------+-----------------+ PERO     Full                                                           +---------+---------------+---------+-----------+----------+-----------------+   +---------+---------------+---------+-----------+----------+--------------+ LEFT     CompressibilityPhasicitySpontaneityPropertiesThrombus Aging +---------+---------------+---------+-----------+----------+--------------+ CFV      Full           Yes      Yes                                 +---------+---------------+---------+-----------+----------+--------------+ SFJ      Full                                                        +---------+---------------+---------+-----------+----------+--------------+ FV Prox  Full                                                        +---------+---------------+---------+-----------+----------+--------------+ FV Mid   Full                                                         +---------+---------------+---------+-----------+----------+--------------+ FV DistalFull                                                        +---------+---------------+---------+-----------+----------+--------------+ PFV      Full                                                        +---------+---------------+---------+-----------+----------+--------------+  POP      Full           Yes      Yes                                 +---------+---------------+---------+-----------+----------+--------------+ PTV      Full                                                        +---------+---------------+---------+-----------+----------+--------------+ PERO     Full                                                        +---------+---------------+---------+-----------+----------+--------------+     Summary: RIGHT: - Findings consistent with age indeterminate deep vein thrombosis involving the right popliteal vein. - No cystic structure found in the popliteal fossa.  LEFT: - There is no evidence of deep vein thrombosis in the lower extremity.  - No cystic structure found in the popliteal fossa.  *See table(s) above for measurements and observations. Electronically signed by Deitra Mayo MD on 07/28/2020 at 1:46:15 PM.    Final (Updated)     Lab Data:  CBC: Recent Labs  Lab 07/27/20 1327 07/28/20 0111 07/28/20 1238 07/29/20 0034  WBC 10.6* 6.0 6.9 8.9  NEUTROABS 8.5*  --  5.8 7.7  HGB 14.1 12.3* 12.8* 12.0*  HCT 41.8 36.4* 38.2* 36.1*  MCV 81.5 81.1 80.4 80.8  PLT 322 288 305 449   Basic Metabolic Panel: Recent Labs  Lab 07/27/20 1327 07/28/20 0111 07/28/20 1238 07/29/20 0034  NA 137  --  136 133*  K 4.3  --  4.0 4.5  CL 99  --  100 100  CO2 25  --  26 25  GLUCOSE 133*  --  115* 122*  BUN 34*  --  24* 25*  CREATININE 1.50* 1.14 1.00 1.01  CALCIUM 9.4  --  8.9 8.8*  MG  --   --  2.3 2.2   GFR: Estimated Creatinine  Clearance: 60.2 mL/min (by C-G formula based on SCr of 1.01 mg/dL). Liver Function Tests: Recent Labs  Lab 07/27/20 1327 07/28/20 1238 07/29/20 0034  AST 111* 88* 100*  ALT 95* 85* 98*  ALKPHOS 67 58 55  BILITOT 1.1 0.7 0.6  PROT 7.9 6.5 6.0*  ALBUMIN 3.1* 2.6* 2.5*   No results for input(s): LIPASE, AMYLASE in the last 168 hours. No results for input(s): AMMONIA in the last 168 hours. Coagulation Profile: No results for input(s): INR, PROTIME in the last 168 hours. Cardiac Enzymes: No results for input(s): CKTOTAL, CKMB, CKMBINDEX, TROPONINI in the last 168 hours. BNP (last 3 results) No results for input(s): PROBNP in the last 8760 hours. HbA1C: No results for input(s): HGBA1C in the last 72 hours. CBG: No results for input(s): GLUCAP in the last 168 hours. Lipid Profile: Recent Labs    07/27/20 1730  TRIG 125   Thyroid Function Tests: Recent Labs    07/28/20 1238  TSH 2.145   Anemia Panel: Recent Labs    07/27/20 1730  FERRITIN 147   Urine analysis:  Component Value Date/Time   COLORURINE YELLOW 07/28/2020 1256   APPEARANCEUR HAZY (A) 07/28/2020 1256   LABSPEC 1.029 07/28/2020 1256   PHURINE 5.0 07/28/2020 1256   GLUCOSEU NEGATIVE 07/28/2020 1256   GLUCOSEU NEGATIVE 11/07/2008 0000   HGBUR SMALL (A) 07/28/2020 1256   BILIRUBINUR NEGATIVE 07/28/2020 1256   KETONESUR NEGATIVE 07/28/2020 1256   PROTEINUR NEGATIVE 07/28/2020 1256   UROBILINOGEN 1.0 07/09/2009 2019   NITRITE NEGATIVE 07/28/2020 1256   LEUKOCYTESUR NEGATIVE 07/28/2020 1256     Melainie Krinsky M.D. [facility] 07/29/2020, 1:36 PM   Call night coverage person covering after 7pm

## 2020-07-29 NOTE — Progress Notes (Signed)
Inpatient Rehab Admissions Coordinator Note:   Per PT recommendations, pt was screened for CIR candidacy by Shann Medal, PT, DPT.  Note pt positive for Covid on 12/14.  Patients are eligible to be considered for admit to the [place] when cleared from airborne precautions by Acute MD, or Infectious Disease MD.  Otherwise, they will need to be >20 days from their positive test with recovery/improvement in symptoms or 2 negative tests.    If patient meets one of the above criteria while still admitted to the hospital, please feel free to contact me and I will re-assess.   Please contact me with questions.   Shann Medal, PT, DPT [phone] 07/29/20 11:34 AM

## 2020-07-29 NOTE — Progress Notes (Signed)
Physical Therapy Treatment Patient Details Name: Angel WASKO Sr. MRN: [id number] DOB: 1941/01/04 Today's Date: 07/29/2020    History of Present Illness Pt is an 79 y.o. male admitted 07/27/20 with acute hypoxic respiratory failure secondary to COVID PNA. PMH includes HTN, bilateral rotator cuff repair.   PT Comments    Pt progressing well with mobility. Able to perform multiple bouts of transfers and hallway ambulation without DME, supervision for safety with lines and balance. SpO2 92% on 2L O2 [place] (not always reliable pleth) and DOE 2/4. Pt pleasantly declining follow-up with HHPT services. Will continue to follow acutely to address established goals. Encouraged more frequent hallway ambulation with nursing staff.   Follow Up Recommendations  No PT follow up;Supervision - Intermittent (declined HHPT)     Equipment Recommendations  None recommended by PT    Recommendations for Other Services       Precautions / Restrictions Precautions Precautions: Fall;Other (comment) Precaution Comments: Watch SpO2 (difficulty getting reliable pleth 12/15) Restrictions Weight Bearing Restrictions: No    Mobility  Bed Mobility               General bed mobility comments: received sitting in recliner  Transfers Overall transfer level: Needs assistance Equipment used: 1 person hand held assist Transfers: Sit to/from Stand Sit to Stand: Supervision         General transfer comment: close guarding for safety and line management  Ambulation/Gait Ambulation/Gait assistance: Supervision Gait Distance (Feet): 500 Feet Assistive device: None Gait Pattern/deviations: Step-through pattern;Decreased stride length Gait velocity: Decreased   General Gait Details: Slow, mostly steady gait without DME, supervision for safety and assist to manage lines; 2x self-corrected instability, cues for gait mechanics to improve balance, especially with turns; DOE 2/4. Difficulty getting reliable  pleth reading for SpO2, reading 92% on 2L upon return to room   Stairs             Wheelchair Mobility    Modified Rankin (Stroke Patients Only)       Balance Overall balance assessment: Needs assistance Sitting-balance support: Feet supported;Bilateral upper extremity supported Sitting balance-Leahy Scale: Good     Standing balance support: No upper extremity supported;During functional activity Standing balance-Leahy Scale: Good Standing balance comment: high guarded posture. More supportive with some UE support                            Cognition Arousal/Alertness: Awake/alert Behavior During Therapy: WFL for tasks assessed/performed Overall Cognitive Status: No family/caregiver present to determine baseline cognitive functioning Area of Impairment: Safety/judgement                         Safety/Judgement: Decreased awareness of deficits            Exercises Other Exercises Other Exercises: Incentive spirometer x10 (good technique without coughing) - pulling 1000-1282mL after walk (1x up to 2000 mL)    General Comments General comments (skin integrity, edema, etc.): Post-ambulation BP 168/86, HR 47, SpO2 96% on 2L O2 at rest      Pertinent Vitals/Pain Pain Assessment: No/denies pain    Home Living                      Prior Function            PT Goals (current goals can now be found in the care plan section) Acute Rehab PT Goals Patient Stated Goal:  get back to independent self- going to gym Progress towards PT goals: Progressing toward goals    Frequency    Min 3X/week      PT Plan Discharge plan needs to be updated;Frequency needs to be updated    Co-evaluation              AM-PAC PT "6 Clicks" Mobility   Outcome Measure  Help needed turning from your back to your side while in a flat bed without using bedrails?: None Help needed moving from lying on your back to sitting on the side of a flat  bed without using bedrails?: None Help needed moving to and from a bed to a chair (including a wheelchair)?: A Little Help needed standing up from a chair using your arms (e.g., wheelchair or bedside chair)?: A Little Help needed to walk in hospital room?: A Little Help needed climbing 3-5 steps with a railing? : A Little 6 Click Score: 20    End of Session Equipment Utilized During Treatment: Oxygen Activity Tolerance: Patient tolerated treatment well Patient left: in chair;with call bell/phone within reach Nurse Communication: Mobility status PT Visit Diagnosis: Unsteadiness on feet (R26.81);Difficulty in walking, not elsewhere classified (R26.2);Muscle weakness (generalized) (M62.81)     Time: 2010-0712 PT Time Calculation (min) (ACUTE ONLY): 24 min  Charges:  $Gait Training: 8-22 mins $Therapeutic Exercise: 8-22 mins                     Mabeline Caras, PT, DPT Acute Rehabilitation Services  Pager [phone] Office [place] 07/29/2020, 4:45 PM

## 2020-07-29 NOTE — Care Management (Signed)
Bene check sent for Eliquis and Jennye Moccasin

## 2020-07-30 LAB — CBC WITH DIFFERENTIAL/PLATELET
Abs Immature Granulocytes: 0.1 10*3/uL — ABNORMAL HIGH (ref 0.00–0.07)
Basophils Absolute: 0 10*3/uL (ref 0.0–0.1)
Basophils Relative: 0 %
Eosinophils Absolute: 0 10*3/uL (ref 0.0–0.5)
Eosinophils Relative: 0 %
HCT: 36.4 % — ABNORMAL LOW (ref 39.0–52.0)
Hemoglobin: 12.1 g/dL — ABNORMAL LOW (ref 13.0–17.0)
Immature Granulocytes: 1 %
Lymphocytes Relative: 8 %
Lymphs Abs: 0.7 10*3/uL (ref 0.7–4.0)
MCH: 26.9 pg (ref 26.0–34.0)
MCHC: 33.2 g/dL (ref 30.0–36.0)
MCV: 80.9 fL (ref 80.0–100.0)
Monocytes Absolute: 0.3 10*3/uL (ref 0.1–1.0)
Monocytes Relative: 3 %
Neutro Abs: 7.6 10*3/uL (ref 1.7–7.7)
Neutrophils Relative %: 88 %
Platelets: 301 10*3/uL (ref 150–400)
RBC: 4.5 MIL/uL (ref 4.22–5.81)
RDW: 13.2 % (ref 11.5–15.5)
WBC: 8.7 10*3/uL (ref 4.0–10.5)
nRBC: 0 % (ref 0.0–0.2)

## 2020-07-30 LAB — COMPREHENSIVE METABOLIC PANEL
ALT: 74 U/L — ABNORMAL HIGH (ref 0–44)
AST: 45 U/L — ABNORMAL HIGH (ref 15–41)
Albumin: 2.3 g/dL — ABNORMAL LOW (ref 3.5–5.0)
Alkaline Phosphatase: 50 U/L (ref 38–126)
Anion gap: 10 (ref 5–15)
BUN: 28 mg/dL — ABNORMAL HIGH (ref 8–23)
CO2: 25 mmol/L (ref 22–32)
Calcium: 8.6 mg/dL — ABNORMAL LOW (ref 8.9–10.3)
Chloride: 102 mmol/L (ref 98–111)
Creatinine, Ser: 1.04 mg/dL (ref 0.61–1.24)
GFR, Estimated: >60 mL/min (ref >60)
Glucose, Bld: 145 mg/dL — ABNORMAL HIGH (ref 70–99)
Potassium: 4.2 mmol/L (ref 3.5–5.1)
Sodium: 137 mmol/L (ref 135–145)
Total Bilirubin: 0.6 mg/dL (ref 0.3–1.2)
Total Protein: 5.4 g/dL — ABNORMAL LOW (ref 6.5–8.1)

## 2020-07-30 LAB — D-DIMER, QUANTITATIVE: D-Dimer, Quant: 2.43 ug/mL-FEU — ABNORMAL HIGH (ref 0.00–0.50)

## 2020-07-30 LAB — C-REACTIVE PROTEIN: CRP: 3.7 mg/dL — ABNORMAL HIGH (ref <1.0)

## 2020-07-30 LAB — MAGNESIUM: Magnesium: 2.2 mg/dL (ref 1.7–2.4)

## 2020-07-30 LAB — BRAIN NATRIURETIC PEPTIDE: B Natriuretic Peptide: 121.8 pg/mL — ABNORMAL HIGH (ref 0.0–100.0)

## 2020-07-30 MED ORDER — PREDNISONE 10 MG PO TABS: ORAL_TABLET | ORAL | 0 refills | Status: DC

## 2020-07-30 MED ORDER — APIXABAN 5 MG PO TABS: 5.0000 mg | ORAL_TABLET | Freq: Two times a day (BID) | ORAL | Status: DC

## 2020-07-30 MED ORDER — ASPIRIN EC 81 MG PO TBEC: 81.0000 mg | DELAYED_RELEASE_TABLET | Freq: Every day | ORAL | 11 refills | Status: DC

## 2020-07-30 MED ORDER — GUAIFENESIN-DM 100-10 MG/5ML PO SYRP: 10.0000 mL | ORAL_SOLUTION | ORAL | 0 refills | Status: DC | PRN

## 2020-07-30 MED ORDER — ASCORBIC ACID 500 MG PO TABS: 500.0000 mg | ORAL_TABLET | Freq: Every day | ORAL | 0 refills | Status: DC

## 2020-07-30 MED ORDER — APIXABAN 5 MG PO TABS
10.0000 mg | ORAL_TABLET | Freq: Two times a day (BID) | ORAL | Status: DC
  Administered 2020-07-30 – 2020-07-31 (×2): 10 mg via ORAL
  Filled 2020-07-30 (×2): qty 2

## 2020-07-30 MED ORDER — PANTOPRAZOLE SODIUM 40 MG PO TBEC: 40.0000 mg | DELAYED_RELEASE_TABLET | Freq: Every day | ORAL | 0 refills | Status: DC

## 2020-07-30 MED ORDER — ZINC SULFATE 220 (50 ZN) MG PO CAPS: 220.0000 mg | ORAL_CAPSULE | Freq: Every day | ORAL | 0 refills | Status: DC

## 2020-07-30 MED ORDER — APIXABAN 5 MG PO TABS: ORAL_TABLET | ORAL | 4 refills | Status: DC

## 2020-07-30 NOTE — Progress Notes (Signed)
[facility]                                                                              Patient Demographics  Angel Keller, is a 79 y.o. male, DOB - [birth year], JJO:[id number]  Admit date - 07/27/2020   Admitting Physician Lorella Nimrod, MD  Outpatient Primary MD for the patient is Timoteo Gaul, FNP  Outpatient specialists:   LOS - 3  days   Medical records reviewed and are as summarized below:    Chief Complaint  Patient presents with  . Shortness of Breath  . Cough  . Fatigue       Brief summary   Angel Kelleris a 79 y.o.malewith medical history significant ofhypertension, hyperlipidemia, and hypothyroidism came to ED with worsening shortness of breath, fatigue and cough for 1-2week. Patient recently saw his PCP for upper respiratory symptoms and was given prednisone and Zithromax.  To get worse and finally was diagnosed with acute hypoxic respiratory failure due to COVID-19 pneumonia along with severe dehydration and AKI admitted to the hospital   Assessment & Plan     Acute hypoxic respiratory failure due to acute COVID-19 viral pneumonia during the ongoing COVID-19 pandemic- POA - Patient presented with worsening shortness of breath, fatigue and cough for 1 to 2 weeks PTA, diagnosed with COVID-19 pneumonia along with severe dehydration and AKI.  Vaccinated according to the H&P.  - Continue IV Solu-Medrol, remdesivir, 4/5 per pharmacy protocol.  Patient has consented for Actemra use if he gets worse. - Continue Supportive care: vitamin C/zinc, albuterol, Tylenol. -   Home O2 evaluation done, needs 3 L on ambulation.  We will continue to wean O2 as tolerated  - Oxygen - SpO2: 94 % O2 Flow Rate (L/min): (S) 1 L/min - Continue to follow labs as below.  D-dimer trending down.  Lab Results  Component Value Date   SARSCOV2NAA POSITIVE (A) 07/27/2020   [place] NEGATIVE 02/18/2019     Recent Labs  Lab 07/27/20 1327 07/27/20 1730  07/28/20 1238 07/29/20 0034 07/30/20 0253  DDIMER  --  7.47* 3.10* 3.00* 2.43*  FERRITIN  --  147  --   --   --   CRP  --  11.0* 10.7* 7.9* 3.7*  ALT 95*  --  85* 98* 74*  PROCALCITON  --  <0.10  --   --   --      Elevated D-dimer, +DVT -Likely due to COVID-19, venous Dopplers lower extremity 07/28/2020 showed age indeterminate DVT involving the right popliteal vein -started on Eliquis per pharmacy. No worsening hypoxia, so no CTA needed now that he is on full AC.   -Hold aspirin 81 mg daily while on Eliquis  GERD Continue PPI  Gout Continue allopurinol  Acute kidney injury, dehydration, lactic acidosis -Creatinine 1.5 at the time of admission, lactic acid 2.4 -Renal ultrasound negative for any obstructive uropathy, appears prerenal -Creatinine stable 1.0  Dyslipidemia Continue statin  Essential hypertension - continue bisoprolol, as needed hydralazine  Generalized debility Much improving, PT evaluation done, recommended home health  Code Status: Full CODE STATUS DVT Prophylaxis:  Lovenox  Family Communication:  Discussed all imaging results, lab results, explained to the patient. Called patient's daughter Lattie Haw [phone], discussed the plan.  Called back again to update that patient will be discharging home tomorrow once remdesivir 5/5 doses completed  Disposition Plan:     Status is: Inpatient  Remains inpatient appropriate because:Inpatient level of care appropriate due to severity of illness   Dispo: The patient is from: Home              Anticipated d/c is to: Home              Anticipated d/c date is: 1 day              Patient currently is not medically stable to d/c.  On remdesivir 4/5 dose today, likely DC home in a.m.   Time Spent in minutes   35 minutes  Procedures:  Doppler ultrasound lower extremity  Consultants:   None  Antimicrobials:   Anti-infectives (From admission, onward)   Start     Dose/Rate Route Frequency Ordered Stop   07/28/20  1000  remdesivir 100 mg in sodium chloride 0.9 % 100 mL IVPB       "Followed by" Linked Group Details   100 mg 200 mL/hr over 30 Minutes Intravenous Daily 07/27/20 1942 08/01/20 0959   07/27/20 1945  remdesivir 200 mg in sodium chloride 0.9% 250 mL IVPB       "Followed by" Linked Group Details   200 mg 580 mL/hr over 30 Minutes Intravenous Once 07/27/20 1942 07/28/20 2315         Medications  Scheduled Meds: . allopurinol  300 mg Oral Daily  . apixaban  10 mg Oral BID   Followed by  . [START ON 08/07/2020] apixaban  5 mg Oral BID  . vitamin C  500 mg Oral Daily  . bisoprolol  5 mg Oral Daily  . cholecalciferol  2,000 Units Oral Daily  . escitalopram  20 mg Oral Daily  . feeding supplement  237 mL Oral BID BM  . levothyroxine  112 mcg Oral Daily  . methylPREDNISolone (SOLU-MEDROL) injection  60 mg Intravenous Q12H  . pantoprazole  40 mg Oral Daily  . pravastatin  20 mg Oral q1800  . sodium chloride flush  3 mL Intravenous Q12H  . vitamin B-12  1,000 mcg Oral Daily  . zinc sulfate  220 mg Oral Daily   Continuous Infusions: . remdesivir 100 mg in NS 100 mL Stopped (07/30/20 0930)   PRN Meds:.acetaminophen, chlorpheniramine-HYDROcodone, guaiFENesin-dextromethorphan, hydrALAZINE, [DISCONTINUED] ondansetron **OR** ondansetron (ZOFRAN) IV, polyethylene glycol      Subjective:   Angel Keller was seen and examined today.  Sitting up in the chair, not on any O2, feels a whole lot better now.  States his weakness is improving and hoping that he would be able to go home upon discharge.  Shortness of breath is improving.  No fevers. Patient denies dizziness, chest pain, abdominal pain, N/V/D/C, new weakness, numbess, tingling. No acute events overnight.    Objective:   Vitals:   07/30/20 0521 07/30/20 0745 07/30/20 0902 07/30/20 1135  BP: (!) 144/80 (!) 167/92  (!) 176/75  Pulse: (!) 43 (!) 51    Resp: 18 16    Temp: 98 F (36.7 C) 98.7 F (37.1 C)    TempSrc: Axillary Oral     SpO2: 95% 97% 94% 94%  Weight:      Height:        Intake/Output Summary (Last 24 hours) at  07/30/2020 1322 Last data filed at 07/30/2020 1144 Gross per 24 hour  Intake 3949.08 ml  Output 200 ml  Net 3749.08 ml     Wt Readings from Last 3 Encounters:  07/27/20 74.8 kg  02/20/19 73.9 kg  01/14/19 73.9 kg   Physical Exam  General: Alert and oriented x 3, NAD  Cardiovascular: S1 S2 clear, RRR. No pedal edema b/l  Respiratory: CTAB, no wheezing, rales or rhonchi  Gastrointestinal: Soft, nontender, nondistended, NBS  Ext: no pedal edema bilaterally  Neuro: no new deficits  Musculoskeletal: No cyanosis, clubbing  Skin: No rashes  Psych: Normal affect and demeanor, alert and oriented x3     Data Reviewed:  I have personally reviewed following labs and imaging studies  Micro Results Recent Results (from the past 240 hour(s))  Resp Panel by RT-PCR (Flu A&B, Covid) Nasopharyngeal Swab     Status: Abnormal   Collection Time: 07/27/20 12:50 PM   Specimen: Nasopharyngeal Swab; Nasopharyngeal(NP) swabs in vial transport medium  Result Value Ref Range Status   SARS Coronavirus 2 by RT PCR POSITIVE (A) NEGATIVE Final    Comment: emailed L. Berdik RN 14:35 07/27/20 ([email or web address]) (NOTE) SARS-CoV-2 target nucleic acids are DETECTED.  The SARS-CoV-2 RNA is generally detectable in upper respiratory specimens during the acute phase of infection. Positive results are indicative of the presence of the identified virus, but do not rule out bacterial infection or co-infection with other pathogens not detected by the test. Clinical correlation with patient history and other diagnostic information is necessary to determine patient infection status. The expected result is Negative.  Fact Sheet for Patients: EntrepreneurPulse.com.au  Fact Sheet for Healthcare Providers: IncredibleEmployment.be  This test is not yet approved or cleared by the  [place] FDA and  has been authorized for detection and/or diagnosis of SARS-CoV-2 by FDA under an Emergency Use Authorization (EUA).  This EUA will remain in effect (meaning this test can be used) for the duration of  the COVID- 19 declaration under Section 564(b)(1) of the Act, 21 U.S.C. section 360bbb-3(b)(1), unless the authorization is terminated or revoked sooner.     Influenza A by PCR NEGATIVE NEGATIVE Final   Influenza B by PCR NEGATIVE NEGATIVE Final    Comment: (NOTE) The Xpert Xpress SARS-CoV-2/FLU/RSV plus assay is intended as an aid in the diagnosis of influenza from Nasopharyngeal swab specimens and should not be used as a sole basis for treatment. Nasal washings and aspirates are unacceptable for Xpert Xpress SARS-CoV-2/FLU/RSV testing.  Fact Sheet for Patients: EntrepreneurPulse.com.au  Fact Sheet for Healthcare Providers: IncredibleEmployment.be  This test is not yet approved or cleared by the [place] FDA and has been authorized for detection and/or diagnosis of SARS-CoV-2 by FDA under an Emergency Use Authorization (EUA). This EUA will remain in effect (meaning this test can be used) for the duration of the COVID-19 declaration under Section 564(b)(1) of the Act, 21 U.S.C. section 360bbb-3(b)(1), unless the authorization is terminated or revoked.  Performed at [facility], [place] [address], [place], [place] [address]   Blood Culture (routine x 2)     Status: Abnormal   Collection Time: 07/27/20  5:30 PM   Specimen: BLOOD  Result Value Ref Range Status   Specimen Description BLOOD SITE NOT SPECIFIED  Final   Special Requests   Final    BOTTLES DRAWN AEROBIC AND ANAEROBIC Blood Culture results may not be optimal due to an inadequate volume of blood received in culture bottles   Culture  Setup Time   Final    GRAM POSITIVE COCCI IN CLUSTERS AEROBIC BOTTLE ONLY Organism ID to follow CRITICAL RESULT CALLED  TO, READ BACK BY AND VERIFIED WITH: PHARMD H VAN DOHLEN 07/28/20 AT 1958 SK    Culture (A)  Final    STAPHYLOCOCCUS HOMINIS THE SIGNIFICANCE OF ISOLATING THIS ORGANISM FROM A SINGLE SET OF BLOOD CULTURES WHEN MULTIPLE SETS ARE DRAWN IS UNCERTAIN. PLEASE NOTIFY THE MICROBIOLOGY DEPARTMENT WITHIN ONE WEEK IF SPECIATION AND SENSITIVITIES ARE REQUIRED. Performed at [facility], [place] [address], [place], [place] [address]    Report Status 07/29/2020 FINAL  Final  Blood Culture ID Panel (Reflexed)     Status: Abnormal   Collection Time: 07/27/20  5:30 PM  Result Value Ref Range Status   Enterococcus faecalis NOT DETECTED NOT DETECTED Final   Enterococcus Faecium NOT DETECTED NOT DETECTED Final   Listeria monocytogenes NOT DETECTED NOT DETECTED Final   Staphylococcus species DETECTED (A) NOT DETECTED Final    Comment: CRITICAL RESULT CALLED TO, READ BACK BY AND VERIFIED WITH: PHARMD H VAN DOHLEN 07/28/20 AT 1958 SK    Staphylococcus aureus (BCID) NOT DETECTED NOT DETECTED Final   Staphylococcus epidermidis NOT DETECTED NOT DETECTED Final   Staphylococcus lugdunensis NOT DETECTED NOT DETECTED Final   Streptococcus species NOT DETECTED NOT DETECTED Final   Streptococcus agalactiae NOT DETECTED NOT DETECTED Final   Streptococcus pneumoniae NOT DETECTED NOT DETECTED Final   Streptococcus pyogenes NOT DETECTED NOT DETECTED Final   A.calcoaceticus-baumannii NOT DETECTED NOT DETECTED Final   Bacteroides fragilis NOT DETECTED NOT DETECTED Final   Enterobacterales NOT DETECTED NOT DETECTED Final   Enterobacter cloacae complex NOT DETECTED NOT DETECTED Final   Escherichia coli NOT DETECTED NOT DETECTED Final   Klebsiella aerogenes NOT DETECTED NOT DETECTED Final   Klebsiella oxytoca NOT DETECTED NOT DETECTED Final   Klebsiella pneumoniae NOT DETECTED NOT DETECTED Final   Proteus species NOT DETECTED NOT DETECTED Final   Salmonella species NOT DETECTED NOT DETECTED Final   Serratia marcescens  NOT DETECTED NOT DETECTED Final   Haemophilus influenzae NOT DETECTED NOT DETECTED Final   Neisseria meningitidis NOT DETECTED NOT DETECTED Final   Pseudomonas aeruginosa NOT DETECTED NOT DETECTED Final   Stenotrophomonas maltophilia NOT DETECTED NOT DETECTED Final   Candida albicans NOT DETECTED NOT DETECTED Final   Candida auris NOT DETECTED NOT DETECTED Final   Candida glabrata NOT DETECTED NOT DETECTED Final   Candida krusei NOT DETECTED NOT DETECTED Final   Candida parapsilosis NOT DETECTED NOT DETECTED Final   Candida tropicalis NOT DETECTED NOT DETECTED Final   Cryptococcus neoformans/gattii NOT DETECTED NOT DETECTED Final    Comment: Performed at [facility], [address], [place], [place] [address]  Blood Culture (routine x 2)     Status: None (Preliminary result)   Collection Time: 07/27/20  5:35 PM   Specimen: BLOOD LEFT ARM  Result Value Ref Range Status   Specimen Description BLOOD LEFT ARM  Final   Special Requests   Final    BOTTLES DRAWN AEROBIC AND ANAEROBIC Blood Culture results may not be optimal due to an excessive volume of blood received in culture bottles   Culture   Final    NO GROWTH 2 DAYS Performed at [facility], [place] [address] [place], [place] [address]    Report Status PENDING  Incomplete  MRSA PCR Screening     Status: None   Collection Time: 07/28/20 12:14 PM  Specimen: Nasopharyngeal  Result Value Ref Range Status   MRSA by PCR NEGATIVE NEGATIVE Final    Comment:        The GeneXpert MRSA Assay (FDA approved for NASAL specimens only), is one component of a comprehensive MRSA colonization surveillance program. It is not intended to diagnose MRSA infection nor to guide or monitor treatment for MRSA infections. Performed at [facility], [place] [address] [place], [place] [address]     Radiology Reports DG Chest 2 View  Result Date: 07/27/2020 CLINICAL DATA:  Shortness of breath EXAM: CHEST - 2 VIEW COMPARISON:   03/06/2017 FINDINGS: The heart size and mediastinal contours are within normal limits. Patchy perihilar and bibasilar airspace opacities, left greater than right. No pleural effusion or pneumothorax. The visualized skeletal structures are unremarkable. IMPRESSION: Patchy perihilar and bibasilar airspace opacities, left greater than right. Findings are suspicious for multifocal atypical/viral pneumonia. Electronically Signed   By: Davina Poke D.O.   On: 07/27/2020 12:44   US RENAL  Result Date: 07/28/2020 CLINICAL DATA:  Acute kidney injury. EXAM: RENAL / URINARY TRACT ULTRASOUND COMPLETE COMPARISON:  None. FINDINGS: Right Kidney: Renal measurements: 10.3 x 4.0 x 5.2 cm = volume: 112 mL. Echogenicity within normal limits. No mass or hydronephrosis visualized. Left Kidney: Renal measurements: 10.3 x 5.2 x 4.3 cm = volume: 122 mL. Echogenicity within normal limits. No mass or hydronephrosis visualized. Bladder: Not visualized on the current exam. Other: None. IMPRESSION: 1. Unremarkable sonographic appearance of the kidneys. 2. Urinary bladder not visualized, may be decompressed. Electronically Signed   By: Keith Rake M.D.   On: 07/28/2020 16:32   VAS [place] LOWER EXTREMITY VENOUS (DVT)  Result Date: 07/28/2020  Lower Venous DVT Study Indications: Covid, rapidly rising ddimer.  Comparison Study: no prior Performing Technologist: Abram Sander RVS  Examination Guidelines: A complete evaluation includes B-mode imaging, spectral Doppler, color Doppler, and power Doppler as needed of all accessible portions of each vessel. Bilateral testing is considered an integral part of a complete examination. Limited examinations for reoccurring indications may be performed as noted. The reflux portion of the exam is performed with the patient in reverse Trendelenburg.  +---------+---------------+---------+-----------+----------+-----------------+ RIGHT    CompressibilityPhasicitySpontaneityPropertiesThrombus  Aging    +---------+---------------+---------+-----------+----------+-----------------+ CFV      Full           Yes      Yes                                    +---------+---------------+---------+-----------+----------+-----------------+ SFJ      Full                                                           +---------+---------------+---------+-----------+----------+-----------------+ FV Prox  Full                                                           +---------+---------------+---------+-----------+----------+-----------------+ FV Mid   Full                                                           +---------+---------------+---------+-----------+----------+-----------------+  FV DistalFull                                                           +---------+---------------+---------+-----------+----------+-----------------+ PFV      Full                                                           +---------+---------------+---------+-----------+----------+-----------------+ POP      None           No       No                   Age Indeterminate +---------+---------------+---------+-----------+----------+-----------------+ PTV      Full                                                           +---------+---------------+---------+-----------+----------+-----------------+ PERO     Full                                                           +---------+---------------+---------+-----------+----------+-----------------+   +---------+---------------+---------+-----------+----------+--------------+ LEFT     CompressibilityPhasicitySpontaneityPropertiesThrombus Aging +---------+---------------+---------+-----------+----------+--------------+ CFV      Full           Yes      Yes                                 +---------+---------------+---------+-----------+----------+--------------+ SFJ      Full                                                         +---------+---------------+---------+-----------+----------+--------------+ FV Prox  Full                                                        +---------+---------------+---------+-----------+----------+--------------+ FV Mid   Full                                                        +---------+---------------+---------+-----------+----------+--------------+ FV DistalFull                                                        +---------+---------------+---------+-----------+----------+--------------+  PFV      Full                                                        +---------+---------------+---------+-----------+----------+--------------+ POP      Full           Yes      Yes                                 +---------+---------------+---------+-----------+----------+--------------+ PTV      Full                                                        +---------+---------------+---------+-----------+----------+--------------+ PERO     Full                                                        +---------+---------------+---------+-----------+----------+--------------+     Summary: RIGHT: - Findings consistent with age indeterminate deep vein thrombosis involving the right popliteal vein. - No cystic structure found in the popliteal fossa.  LEFT: - There is no evidence of deep vein thrombosis in the lower extremity.  - No cystic structure found in the popliteal fossa.  *See table(s) above for measurements and observations. Electronically signed by Deitra Mayo MD on 07/28/2020 at 1:46:15 PM.    Final (Updated)     Lab Data:  CBC: Recent Labs  Lab 07/27/20 1327 07/28/20 0111 07/28/20 1238 07/29/20 0034 07/30/20 0253  WBC 10.6* 6.0 6.9 8.9 8.7  NEUTROABS 8.5*  --  5.8 7.7 7.6  HGB 14.1 12.3* 12.8* 12.0* 12.1*  HCT 41.8 36.4* 38.2* 36.1* 36.4*  MCV 81.5 81.1 80.4 80.8 80.9  PLT 322 288 305 304 315   Basic Metabolic  Panel: Recent Labs  Lab 07/27/20 1327 07/28/20 0111 07/28/20 1238 07/29/20 0034 07/30/20 0253  NA 137  --  136 133* 137  K 4.3  --  4.0 4.5 4.2  CL 99  --  100 100 102  CO2 25  --  26 25 25   GLUCOSE 133*  --  115* 122* 145*  BUN 34*  --  24* 25* 28*  CREATININE 1.50* 1.14 1.00 1.01 1.04  CALCIUM 9.4  --  8.9 8.8* 8.6*  MG  --   --  2.3 2.2 2.2   GFR: Estimated Creatinine Clearance: 58.5 mL/min (by C-G formula based on SCr of 1.04 mg/dL). Liver Function Tests: Recent Labs  Lab 07/27/20 1327 07/28/20 1238 07/29/20 0034 07/30/20 0253  AST 111* 88* 100* 45*  ALT 95* 85* 98* 74*  ALKPHOS 67 58 55 50  BILITOT 1.1 0.7 0.6 0.6  PROT 7.9 6.5 6.0* 5.4*  ALBUMIN 3.1* 2.6* 2.5* 2.3*   No results for input(s): LIPASE, AMYLASE in the last 168 hours. No results for input(s): AMMONIA in the last 168 hours. Coagulation Profile: No results for input(s): INR, PROTIME in the last 168 hours. Cardiac Enzymes: No results for input(s):  CKTOTAL, CKMB, CKMBINDEX, TROPONINI in the last 168 hours. BNP (last 3 results) No results for input(s): PROBNP in the last 8760 hours. HbA1C: No results for input(s): HGBA1C in the last 72 hours. CBG: No results for input(s): GLUCAP in the last 168 hours. Lipid Profile: Recent Labs    07/27/20 1730  TRIG 125   Thyroid Function Tests: Recent Labs    07/28/20 1238  TSH 2.145   Anemia Panel: Recent Labs    07/27/20 1730  FERRITIN 147   Urine analysis:    Component Value Date/Time   COLORURINE YELLOW 07/28/2020 1256   APPEARANCEUR HAZY (A) 07/28/2020 1256   LABSPEC 1.029 07/28/2020 1256   PHURINE 5.0 07/28/2020 1256   GLUCOSEU NEGATIVE 07/28/2020 1256   GLUCOSEU NEGATIVE 11/07/2008 0000   HGBUR SMALL (A) 07/28/2020 1256   BILIRUBINUR NEGATIVE 07/28/2020 1256   KETONESUR NEGATIVE 07/28/2020 1256   PROTEINUR NEGATIVE 07/28/2020 1256   UROBILINOGEN 1.0 07/09/2009 2019   NITRITE NEGATIVE 07/28/2020 1256   LEUKOCYTESUR NEGATIVE 07/28/2020  1256     Maricela Kawahara M.D. [facility] 07/30/2020, 1:22 PM   Call night coverage person covering after 7pm

## 2020-07-30 NOTE — Progress Notes (Signed)
ANTICOAGULATION CONSULT NOTE - Follow Up Consult  Pharmacy Consult for Lovenox transition to Eliquis Indication: COVID and DVT  No Known Allergies  Patient Measurements: Height: 5\' 10"  (177.8 cm) Weight: 74.8 kg (165 lb) IBW/kg (Calculated) : 73  Vital Signs: Temp: 98.7 F (37.1 C) (12/16 0745) Temp Source: Oral (12/16 0745) BP: 176/75 (12/16 1135) Pulse Rate: 51 (12/16 0745)  Labs: Recent Labs    07/28/20 1238 07/29/20 0034 07/30/20 0253  HGB 12.8* 12.0* 12.1*  HCT 38.2* 36.1* 36.4*  PLT 305 304 301  CREATININE 1.00 1.01 1.04    Estimated Creatinine Clearance: 58.5 mL/min (by C-G formula based on SCr of 1.04 mg/dL).  Assessment: 79 year old male with COVID currently on  Lovenox for DVT treatment for acute RLE DVT.   Pharmacy consulted 12/16 to transition anticoagulation to oral Eliquis.    Lovenox dose last given at 08:50 AM 12/16  Goal of Therapy:  Anti-Xa level 0.6-1 units/ml 4hrs after LMWH dose given Monitor platelets by anticoagulation protocol: Yes   Plan:  Discontinue lovenox now and this evening start Eliquis 10 mg PO BID x 7 days then 5 mg  PO BID for + RLE DVT Co-pay $47 per case manager's benefits assessment.  Thank you Nicole Cella, RPh Clinical Pharmacist [phone] Please check AMION for all [facility] phone numbers After 10:00 PM, call [place] [phone] 07/30/2020,12:52 PM

## 2020-07-30 NOTE — Progress Notes (Signed)
SATURATION QUALIFICATIONS: (This note is used to comply with regulatory documentation for home oxygen)  Patient Saturations on Room Air at Rest = 95%  Patient Saturations on Room Air while Ambulating = 86%  Patient Saturations on 3 Liters of oxygen while Ambulating = 92%  Please briefly explain why patient needs home oxygen:

## 2020-07-30 NOTE — TOC Progression Note (Addendum)
Transition of Care (TOC) - Progression Note    Patient Details  Name: Angel TIMKO Sr. MRN: [id number] Date of Birth: [birth year]  Transition of [facility]) CM/SW [place], RN Phone Number: 07/30/2020, 11:12 AM  Clinical Narrative:    Asked RN for oxygen qualifiers so that oxygen can be ordered.   1250 Spoke to patient, oxygen qualifiers done, oxygen ordered. Patient states his daughter can come pick him up if discharged. Discussed DME< he states not needed, does not feel he needs HH. He has a walk in bathtub.for bathing, showering, Help from daughter. Discussed and educated on oxygen and obtaining oxygen saturation device. Patient has an appointment with primary MD on January 2 already. Will follow up then.        Expected Discharge Plan and Services                                                 Social Determinants of Health (SDOH) Interventions    Readmission Risk Interventions No flowsheet data found.

## 2020-07-31 LAB — CBC WITH DIFFERENTIAL/PLATELET
Abs Immature Granulocytes: 0.12 10*3/uL — ABNORMAL HIGH (ref 0.00–0.07)
Basophils Absolute: 0 10*3/uL (ref 0.0–0.1)
Basophils Relative: 0 %
Eosinophils Absolute: 0 10*3/uL (ref 0.0–0.5)
Eosinophils Relative: 0 %
HCT: 39.1 % (ref 39.0–52.0)
Hemoglobin: 12.8 g/dL — ABNORMAL LOW (ref 13.0–17.0)
Immature Granulocytes: 2 %
Lymphocytes Relative: 8 %
Lymphs Abs: 0.6 10*3/uL — ABNORMAL LOW (ref 0.7–4.0)
MCH: 26.4 pg (ref 26.0–34.0)
MCHC: 32.7 g/dL (ref 30.0–36.0)
MCV: 80.6 fL (ref 80.0–100.0)
Monocytes Absolute: 0.3 10*3/uL (ref 0.1–1.0)
Monocytes Relative: 3 %
Neutro Abs: 6.7 10*3/uL (ref 1.7–7.7)
Neutrophils Relative %: 87 %
Platelets: 339 10*3/uL (ref 150–400)
RBC: 4.85 MIL/uL (ref 4.22–5.81)
RDW: 13.2 % (ref 11.5–15.5)
WBC: 7.7 10*3/uL (ref 4.0–10.5)
nRBC: 0 % (ref 0.0–0.2)

## 2020-07-31 LAB — COMPREHENSIVE METABOLIC PANEL
ALT: 71 U/L — ABNORMAL HIGH (ref 0–44)
AST: 37 U/L (ref 15–41)
Albumin: 2.6 g/dL — ABNORMAL LOW (ref 3.5–5.0)
Alkaline Phosphatase: 47 U/L (ref 38–126)
Anion gap: 10 (ref 5–15)
BUN: 27 mg/dL — ABNORMAL HIGH (ref 8–23)
CO2: 26 mmol/L (ref 22–32)
Calcium: 8.9 mg/dL (ref 8.9–10.3)
Chloride: 101 mmol/L (ref 98–111)
Creatinine, Ser: 1.02 mg/dL (ref 0.61–1.24)
GFR, Estimated: >60 mL/min (ref >60)
Glucose, Bld: 140 mg/dL — ABNORMAL HIGH (ref 70–99)
Potassium: 4.6 mmol/L (ref 3.5–5.1)
Sodium: 137 mmol/L (ref 135–145)
Total Bilirubin: 0.3 mg/dL (ref 0.3–1.2)
Total Protein: 6 g/dL — ABNORMAL LOW (ref 6.5–8.1)

## 2020-07-31 LAB — D-DIMER, QUANTITATIVE: D-Dimer, Quant: 2.18 ug/mL-FEU — ABNORMAL HIGH (ref 0.00–0.50)

## 2020-07-31 LAB — MAGNESIUM: Magnesium: 2.4 mg/dL (ref 1.7–2.4)

## 2020-07-31 LAB — BRAIN NATRIURETIC PEPTIDE: B Natriuretic Peptide: 98.4 pg/mL (ref 0.0–100.0)

## 2020-07-31 LAB — C-REACTIVE PROTEIN: CRP: 2.5 mg/dL — ABNORMAL HIGH (ref <1.0)

## 2020-07-31 NOTE — Discharge Summary (Signed)
Physician Discharge Summary   Patient ID: Angel SUPPLE Sr. MRN: [id number] DOB/AGE: 80-Oct-1942 80 y.o.  Admit date: 07/27/2020 Discharge date: 07/31/2020  Primary Care Physician:  Timoteo Gaul, FNP   Recommendations for Outpatient Follow-up:  1. Follow up with PCP in 1-2 weeks  Home Health: Home O2 3 L Equipment/Devices:  SATURATION QUALIFICATIONS: (This note is used to comply with regulatory documentation for home oxygen)  Patient Saturations on Room Air at Rest = 95%  Patient Saturations on Room Air while Ambulating = 86%  Patient Saturations on 3 Liters of oxygen while Ambulating = 92%   Discharge Condition: stable  CODE STATUS: FULL Diet recommendation: Heart healthy diet   Discharge Diagnoses:   Acute hypoxic respiratory failure due to acute COVID-19 viral pneumonia Acute right popliteal vein DVT GERD Gout Acute kidney injury dehydration, lactic acidosis Dyslipidemia Essential hypertension Generalizability  Consults:  {none    Allergies:  No Known Allergies   DISCHARGE MEDICATIONS: Allergies as of 07/31/2020   No Known Allergies     Medication List    STOP taking these medications   azithromycin 250 MG tablet Commonly known as: ZITHROMAX     TAKE these medications   allopurinol 300 MG tablet Commonly known as: ZYLOPRIM Take 300 mg by mouth daily.   apixaban 5 MG Tabs tablet Commonly known as: ELIQUIS Take eliquis 10 mg (2 tablets) twice a day for 1 week.  Then on 08/07/2020, taper eliquis to 5 mg (1 tab) twice a day for 3 months or until advised to stop by PCP   ascorbic acid 500 MG tablet Commonly known as: VITAMIN C Take 1 tablet (500 mg total) by mouth daily.   aspirin EC 81 MG tablet Take 1 tablet (81 mg total) by mouth daily. Hold while on blood thinner/eliquis for 3 months, then resume What changed: additional instructions Notes to patient: Start back taking when Eliquis therapy has completed.   B-12 PO Take 1,000 mcg  by mouth daily.   benzonatate 100 MG capsule Commonly known as: TESSALON Take 100 mg by mouth 3 (three) times daily. Notes to patient: Can take as needed if coughing has subsided.   bisoprolol-hydrochlorothiazide 2.5-6.25 MG tablet Commonly known as: ZIAC Take 1 tablet by mouth daily.   D3 ADULT PO Take 2,000 Units by mouth daily.   escitalopram 20 MG tablet Commonly known as: LEXAPRO Take 20 mg by mouth daily.   guaiFENesin-dextromethorphan 100-10 MG/5ML syrup Commonly known as: ROBITUSSIN DM Take 10 mLs by mouth every 4 (four) hours as needed for cough.   lovastatin 20 MG tablet Commonly known as: MEVACOR Take 20 mg by mouth daily.   pantoprazole 40 MG tablet Commonly known as: PROTONIX Take 1 tablet (40 mg total) by mouth daily. While on steroids   predniSONE 10 MG tablet Commonly known as: DELTASONE Prednisone dosing: Take  Prednisone 40mg  (4 tabs) x 3 days, then taper to 30mg  (3 tabs) x 3 days, then 20mg  (2 tabs) x 3days, then 10mg  (1 tab) x 3days, then OFF. What changed:   medication strength  how much to take  how to take this  when to take this  additional instructions   Synthroid 112 MCG tablet Generic drug: levothyroxine Take 112 mcg by mouth daily.   zinc sulfate 220 (50 Zn) MG capsule Take 1 capsule (220 mg total) by mouth daily.            Durable Medical Equipment  (From admission, onward)  Start     Ordered   07/30/20 1320  For home use only DME oxygen  Once       Question Answer Comment  Length of Need 12 Months   Mode or (Route) Nasal cannula   Liters per Minute 2   Frequency Continuous (stationary and portable oxygen unit needed)   Oxygen conserving device Yes   Oxygen delivery system Gas      07/30/20 1319           Brief H and P: For complete details please refer to admission H and P, but in brief Angel Kelleris a 79 y.o.malewith medical history significant ofhypertension, hyperlipidemia, and  hypothyroidism came to ED with worsening shortness of breath, fatigue and cough for 1-2week. Patient recently saw his PCP for upper respiratory symptoms and was given prednisone and Zithromax.To get worse and finally was diagnosed with acute hypoxic respiratory failure due to COVID-19 pneumonia along with severe dehydration and AKI admitted to the hospital  Hospital Course:   Acute hypoxic respiratory failure due to acute COVID-19 viral pneumonia during the ongoing COVID-19 pandemic- POA - Patient presented with worsening shortness of breath, fatigue and cough for 1 to 2 weeks PTA, diagnosed with COVID-19 pneumonia along with severe dehydration and AKI.  Vaccinated according to the H&P.  -Patient was placed on IV Solu-Medrol during hospitalization, patient continues on weekly.  The time of discharge.  Patient received four doses of remdesivir, completed on 12/17 . -Placed on Supportive care: vitamin C/zinc, albuterol, Tylenol, incentive spirometry, flutter valve. -  Home O2 evaluation done, needs 3 L on ambulation.  We will continue to wean O2 as tolerated  - Oxygen - SpO2: 94 % O2 Flow Rate (L/min): (S) 1 L/min -D-dimer trending down.  Lab Results  Component Value Date   SARSCOV2NAA POSITIVE (A) 07/27/2020   [place] NEGATIVE 02/18/2019     Recent Labs  Lab 07/27/20 1327 07/27/20 1730 07/28/20 1238 07/29/20 0034 07/30/20 0253 07/31/20 0044  DDIMER  --  7.47* 3.10* 3.00* 2.43* 2.18*  FERRITIN  --  147  --   --   --   --   CRP  --  11.0* 10.7* 7.9* 3.7* 2.5*  ALT 95*  --  85* 98* 74* 71*  PROCALCITON  --  <0.10  --   --   --   --       Elevated D-dimer, +DVT -Likely due to COVID-19, venous Dopplers lower extremity 07/28/2020 showed age indeterminate DVT involving the right popliteal vein -started on Eliquis per pharmacy. No worsening hypoxia, so no CTA needed now that he is on full AC.   -Hold aspirin 81 mg daily while on Eliquis  GERD Continue PPI  Gout Continue  allopurinol  Acute kidney injury, dehydration, lactic acidosis -Creatinine 1.5 at the time of admission, lactic acid 2.4 -Renal ultrasound negative for any obstructive uropathy, appears prerenal -Creatinine stable 1.0 at the time of discharge.  Dyslipidemia Continue statin  Essential hypertension - continue bisoprolol, as needed hydralazine  Generalized debility Much improving, PT evaluation done, recommended home health       Day of Discharge S doing well, no fevers chills chest pain or coughing.  Shortness of breath is improving, excited to go home today  BP (!) 158/86 (BP Location: Right Arm)   Pulse 69   Temp 98.2 F (36.8 C) (Oral)   Resp (!) 21   Ht 5\' 10"  (1.778 m)   Wt 74.8 kg   SpO2 91%  BMI 23.68 kg/m   Physical Exam: General: Alert and awake oriented x3 not in any acute distress. HEENT: anicteric sclera, pupils reactive to light and accommodation CVS: S1-S2 clear no murmur rubs or gallops Chest: clear to auscultation bilaterally, no wheezing rales or rhonchi Abdomen: soft nontender, nondistended, normal bowel sounds Extremities: no cyanosis, clubbing or edema noted bilaterally Neuro: Cranial nerves II-XII intact, no focal neurological deficits    Get Medicines reviewed and adjusted: Please take all your medications with you for your next visit with your Primary MD  Please request your Primary MD to go over all hospital tests and procedure/radiological results at the follow up. Please ask your Primary MD to get all Hospital records sent to his/her office.  If you experience worsening of your admission symptoms, develop shortness of breath, life threatening emergency, suicidal or homicidal thoughts you must seek medical attention immediately by calling 911 or calling your MD immediately  if symptoms less severe.  You must read complete instructions/literature along with all the possible adverse reactions/side effects for all the Medicines you take  and that have been prescribed to you. Take any new Medicines after you have completely understood and accept all the possible adverse reactions/side effects.   Do not drive when taking pain medications.   Do not take more than prescribed Pain, Sleep and Anxiety Medications  Special Instructions: If you have smoked or chewed Tobacco  in the last 2 yrs please stop smoking, stop any regular Alcohol  and or any Recreational drug use.  Wear Seat belts while driving.  Please note  You were cared for by a hospitalist during your hospital stay. Once you are discharged, your primary care physician will handle any further medical issues. Please note that NO REFILLS for any discharge medications will be authorized once you are discharged, as it is imperative that you return to your primary care physician (or establish a relationship with a primary care physician if you do not have one) for your aftercare needs so that they can reassess your need for medications and monitor your lab values.   The results of significant diagnostics from this hospitalization (including imaging, microbiology, ancillary and laboratory) are listed below for reference.      Procedures/Studies:  DG Chest 2 View  Result Date: 07/27/2020 CLINICAL DATA:  Shortness of breath EXAM: CHEST - 2 VIEW COMPARISON:  03/06/2017 FINDINGS: The heart size and mediastinal contours are within normal limits. Patchy perihilar and bibasilar airspace opacities, left greater than right. No pleural effusion or pneumothorax. The visualized skeletal structures are unremarkable. IMPRESSION: Patchy perihilar and bibasilar airspace opacities, left greater than right. Findings are suspicious for multifocal atypical/viral pneumonia. Electronically Signed   By: Davina Poke D.O.   On: 07/27/2020 12:44   US RENAL  Result Date: 07/28/2020 CLINICAL DATA:  Acute kidney injury. EXAM: RENAL / URINARY TRACT ULTRASOUND COMPLETE COMPARISON:  None. FINDINGS:  Right Kidney: Renal measurements: 10.3 x 4.0 x 5.2 cm = volume: 112 mL. Echogenicity within normal limits. No mass or hydronephrosis visualized. Left Kidney: Renal measurements: 10.3 x 5.2 x 4.3 cm = volume: 122 mL. Echogenicity within normal limits. No mass or hydronephrosis visualized. Bladder: Not visualized on the current exam. Other: None. IMPRESSION: 1. Unremarkable sonographic appearance of the kidneys. 2. Urinary bladder not visualized, may be decompressed. Electronically Signed   By: Keith Rake M.D.   On: 07/28/2020 16:32   VAS [place] LOWER EXTREMITY VENOUS (DVT)  Result Date: 07/28/2020  Lower Venous DVT Study Indications:  Covid, rapidly rising ddimer.  Comparison Study: no prior Performing Technologist: Abram Sander RVS  Examination Guidelines: A complete evaluation includes B-mode imaging, spectral Doppler, color Doppler, and power Doppler as needed of all accessible portions of each vessel. Bilateral testing is considered an integral part of a complete examination. Limited examinations for reoccurring indications may be performed as noted. The reflux portion of the exam is performed with the patient in reverse Trendelenburg.  +---------+---------------+---------+-----------+----------+-----------------+ RIGHT    CompressibilityPhasicitySpontaneityPropertiesThrombus Aging    +---------+---------------+---------+-----------+----------+-----------------+ CFV      Full           Yes      Yes                                    +---------+---------------+---------+-----------+----------+-----------------+ SFJ      Full                                                           +---------+---------------+---------+-----------+----------+-----------------+ FV Prox  Full                                                           +---------+---------------+---------+-----------+----------+-----------------+ FV Mid   Full                                                            +---------+---------------+---------+-----------+----------+-----------------+ FV DistalFull                                                           +---------+---------------+---------+-----------+----------+-----------------+ PFV      Full                                                           +---------+---------------+---------+-----------+----------+-----------------+ POP      None           No       No                   Age Indeterminate +---------+---------------+---------+-----------+----------+-----------------+ PTV      Full                                                           +---------+---------------+---------+-----------+----------+-----------------+ PERO     Full                                                           +---------+---------------+---------+-----------+----------+-----------------+   +---------+---------------+---------+-----------+----------+--------------+  LEFT     CompressibilityPhasicitySpontaneityPropertiesThrombus Aging +---------+---------------+---------+-----------+----------+--------------+ CFV      Full           Yes      Yes                                 +---------+---------------+---------+-----------+----------+--------------+ SFJ      Full                                                        +---------+---------------+---------+-----------+----------+--------------+ FV Prox  Full                                                        +---------+---------------+---------+-----------+----------+--------------+ FV Mid   Full                                                        +---------+---------------+---------+-----------+----------+--------------+ FV DistalFull                                                        +---------+---------------+---------+-----------+----------+--------------+ PFV      Full                                                         +---------+---------------+---------+-----------+----------+--------------+ POP      Full           Yes      Yes                                 +---------+---------------+---------+-----------+----------+--------------+ PTV      Full                                                        +---------+---------------+---------+-----------+----------+--------------+ PERO     Full                                                        +---------+---------------+---------+-----------+----------+--------------+     Summary: RIGHT: - Findings consistent with age indeterminate deep vein thrombosis involving the right popliteal vein. - No cystic structure found in the popliteal fossa.  LEFT: - There is no evidence of deep vein thrombosis in the lower extremity.  - No  cystic structure found in the popliteal fossa.  *See table(s) above for measurements and observations. Electronically signed by Deitra Mayo MD on 07/28/2020 at 1:46:15 PM.    Final (Updated)        LAB RESULTS: Basic Metabolic Panel: Recent Labs  Lab 07/30/20 0253 07/31/20 0044  NA 137 137  K 4.2 4.6  CL 102 101  CO2 25 26  GLUCOSE 145* 140*  BUN 28* 27*  CREATININE 1.04 1.02  CALCIUM 8.6* 8.9  MG 2.2 2.4   Liver Function Tests: Recent Labs  Lab 07/30/20 0253 07/31/20 0044  AST 45* 37  ALT 74* 71*  ALKPHOS 50 47  BILITOT 0.6 0.3  PROT 5.4* 6.0*  ALBUMIN 2.3* 2.6*   No results for input(s): LIPASE, AMYLASE in the last 168 hours. No results for input(s): AMMONIA in the last 168 hours. CBC: Recent Labs  Lab 07/30/20 0253 07/31/20 0044  WBC 8.7 7.7  NEUTROABS 7.6 6.7  HGB 12.1* 12.8*  HCT 36.4* 39.1  MCV 80.9 80.6  PLT 301 339   Cardiac Enzymes: No results for input(s): CKTOTAL, CKMB, CKMBINDEX, TROPONINI in the last 168 hours. BNP: Invalid input(s): POCBNP CBG: No results for input(s): GLUCAP in the last 168 hours.     Disposition and Follow-up: Discharge Instructions    Diet -  low sodium heart healthy   Complete by: As directed    Increase activity slowly   Complete by: As directed        DISPOSITION: Home with home health   [place]    Tamsen Roers, MD. Schedule an appointment as soon as possible for a visit.   Specialty: Family Medicine Why: Has an appointment already on January 2nd  Contact information: [address] [place] [address] [phone]                Time coordinating discharge:  35 min  Signed:   Estill Cotta M.D. [facility] 07/31/2020, 1:00 PM

## 2020-07-31 NOTE — Progress Notes (Signed)
Pt's discharge paperwork reviewed with pt. Pt verbalized understanding. Paperwork given to pt and placed with his belongings to take home with him. Pt's daughter notified that pt is up for discharge. Daughter stated she would be here within the hour to pick up pt to transport home.

## 2020-07-31 NOTE — Progress Notes (Signed)
Physical Therapy Treatment Patient Details Name: Angel JABLONOWSKI Sr. MRN: [id number] DOB: Jan 09, 1941 Today's Date: 07/31/2020    History of Present Illness Pt is an 79 y.o. male admitted 07/27/20 with acute hypoxic respiratory failure secondary to COVID PNA. PMH includes HTN, bilateral rotator cuff repair.   PT Comments    Pt progressing well with mobility. Stability and cardiorespiratory function significant improving with activity. SpO2 down to 85-88% on RA with ambulation, pleth unreliable (via finger probe) and pt asymptomatic. With switch to ear probe, SpO2 immediately reading 98% on RA at rest. Further mobility deferred; RN to ambulate pt later with ear probe to monitor for SpO2 needs at d/c.     Follow Up Recommendations  No PT follow up;Supervision - Intermittent     Equipment Recommendations  None recommended by PT    Recommendations for Other Services       Precautions / Restrictions Precautions Precautions: Fall;Other (comment) Precaution Comments: Watch SpO2 (difficulty getting reliable pleth 12/17) - switched to ear probe after walk and reading much better Restrictions Weight Bearing Restrictions: No    Mobility  Bed Mobility               General bed mobility comments: received sitting in recliner  Transfers Overall transfer level: Needs assistance Equipment used: None Transfers: Sit to/from Stand Sit to Stand: Supervision         General transfer comment: Supervision for safety/line management; no overt instability  Ambulation/Gait Ambulation/Gait assistance: Supervision Gait Distance (Feet): 350 Feet Assistive device: None Gait Pattern/deviations: Step-through pattern;Decreased stride length Gait velocity: Decreased   General Gait Details: Slow, steady gait without DME, supervision for safety/line management; no overt instability or LOB. SpO2 down to 85% on RA (unreliable pleth; pt asymptomatic)   Stairs             Wheelchair  Mobility    Modified Rankin (Stroke Patients Only)       Balance Overall balance assessment: Needs assistance Sitting-balance support: Feet supported;Bilateral upper extremity supported Sitting balance-Leahy Scale: Good       Standing balance-Leahy Scale: Good                              Cognition Arousal/Alertness: Awake/alert Behavior During Therapy: WFL for tasks assessed/performed Overall Cognitive Status: No family/caregiver present to determine baseline cognitive functioning Area of Impairment: Safety/judgement                         Safety/Judgement: Decreased awareness of deficits            Exercises      General Comments General comments (skin integrity, edema, etc.): SpO2 85-88% on RA, upon return to room switched to ear probe and immediately reading 98% on RA; pt declined additional mobility secondary to wanting to eat breakfast and RN present to administer meds; RN agreed to have pt walk again later with ear probe to check O2 needs      Pertinent Vitals/Pain Pain Assessment: Faces Faces Pain Scale: Hurts a little bit Pain Location: Eyes Pain Descriptors / Indicators: Tiring Pain Intervention(s): Monitored during session    Home Living                      Prior Function            PT Goals (current goals can now be found in the care plan section) Progress  towards PT goals: Progressing toward goals    Frequency    Min 3X/week      PT Plan Current plan remains appropriate    Co-evaluation              AM-PAC PT "6 Clicks" Mobility   Outcome Measure  Help needed turning from your back to your side while in a flat bed without using bedrails?: None Help needed moving from lying on your back to sitting on the side of a flat bed without using bedrails?: None Help needed moving to and from a bed to a chair (including a wheelchair)?: A Little Help needed standing up from a chair using your arms (e.g.,  wheelchair or bedside chair)?: A Little Help needed to walk in hospital room?: A Little Help needed climbing 3-5 steps with a railing? : A Little 6 Click Score: 20    End of Session   Activity Tolerance: Patient tolerated treatment well Patient left: in chair;with call bell/phone within reach;with nursing/sitter in room Nurse Communication: Mobility status PT Visit Diagnosis: Unsteadiness on feet (R26.81);Difficulty in walking, not elsewhere classified (R26.2);Muscle weakness (generalized) (M62.81)     Time: 1287-8676 PT Time Calculation (min) (ACUTE ONLY): 15 min  Charges:  $Therapeutic Exercise: 8-22 mins                    Angel Keller, PT, DPT Acute Rehabilitation Services  Pager [phone] Office [phone]  Derry Lory 07/31/2020, 9:38 AM

## 2020-07-31 NOTE — Progress Notes (Signed)
SATURATION QUALIFICATIONS: (This note is used to comply with regulatory documentation for home oxygen)  Patient Saturations on Room Air at Rest = 95%  Patient Saturations on Room Air while Ambulating = 84%  Patient Saturations on 2 Liters of oxygen while Ambulating = 93%  Please briefly explain why patient needs home oxygen: Pt needs O2 because he desats while ambulating on room air.

## 2020-07-31 NOTE — Care Management Important Message (Signed)
Important Message  Patient Details  Name: Angel COSENS Sr. MRN: [id number] Date of Birth: [birth year]   Medicare Important Message Given:  Yes - Important Message mailed due to current National Emergency   Verbal consent obtained due to current National Emergency  Relationship to patient: Self Contact Name: Jearld Hemp Call Date: 07/31/20  Time: 1409 Phone: [phone] Outcome: No Answer/Busy Important Message mailed to: Patient address on file    Delorse Lek 07/31/2020, 2:10 PM

## 2020-07-31 NOTE — Progress Notes (Signed)
Pt's daughter has arrived and awaiting down in the lobby to transport pt home via private vehicle. Pt alert and oriented x4 upon discharge in no acute distress. Pt has taken belongings with him. NT rolled pt down via wheelchair.

## 2020-07-31 NOTE — Discharge Instructions (Signed)
Acute Respiratory Failure, Adult  Acute respiratory failure occurs when there is not enough oxygen passing from your lungs to your body. When this happens, your lungs have trouble removing carbon dioxide from the blood. This causes your blood oxygen level to drop too low as carbon dioxide builds up. Acute respiratory failure is a medical emergency. It can develop quickly, but it is temporary if treated promptly. Your lung capacity, or how much air your lungs can hold, may improve with time, exercise, and treatment. What are the causes? There are many possible causes of acute respiratory failure, including:  Lung injury.  Chest injury or damage to the ribs or tissues near the lungs.  Lung conditions that affect the flow of air and blood into and out of the lungs, such as pneumonia, acute respiratory distress syndrome, and cystic fibrosis.  Medical conditions, such as strokes or spinal cord injuries, that affect the muscles and nerves that control breathing.  Blood infection (sepsis).  Inflammation of the pancreas (pancreatitis).  A blood clot in the lungs (pulmonary embolism).  A large-volume blood transfusion.  Burns.  Near-drowning.  Seizure.  Smoke inhalation.  Reaction to medicines.  Alcohol or drug overdose. What increases the risk? This condition is more likely to develop in people who have:  A blocked airway.  Asthma.  A condition or disease that damages or weakens the muscles, nerves, bones, or tissues that are involved in breathing.  A serious infection.  A health problem that blocks the unconscious reflex that is involved in breathing, such as hypothyroidism or sleep apnea.  A lung injury or trauma. What are the signs or symptoms? Trouble breathing is the main symptom of acute respiratory failure. Symptoms may also include:  Rapid breathing.  Restlessness or anxiety.  Skin, lips, or fingernails that appear blue (cyanosis).  Rapid heart  rate.  Abnormal heart rhythms (arrhythmias).  Confusion or changes in behavior.  Tiredness or loss of energy.  Feeling sleepy or having a loss of consciousness. How is this diagnosed? Your health care provider can diagnose acute respiratory failure with a medical history and physical exam. During the exam, your health care provider will listen to your heart and check for crackling or wheezing sounds in your lungs. Your may also have tests to confirm the diagnosis and determine what is causing respiratory failure. These tests may include:  Measuring the amount of oxygen in your blood (pulse oximetry). The measurement comes from a small device that is placed on your finger, earlobe, or toe.  Other blood tests to measure blood gases and to look for signs of infection.  Sampling your cerebral spinal fluid or tracheal fluid to check for infections.  Chest X-ray to look for fluid in spaces that should be filled with air.  Electrocardiogram (ECG) to look at the heart's electrical activity. How is this treated? Treatment for this condition usually takes places in a hospital intensive care unit (ICU). Treatment depends on what is causing the condition. It may include one or more treatments until your symptoms improve. Treatment may include:  Supplemental oxygen. Extra oxygen is given through a tube in the nose, a face mask, or a hood.  A device such as a continuous positive airway pressure (CPAP) or bi-level positive airway pressure (BiPAP or BPAP) machine. This treatment uses mild air pressure to keep the airways open. A mask or other device will be placed over your nose or mouth. A tube that is connected to a motor will deliver oxygen through  the mask.  Ventilator. This treatment helps move air into and out of the lungs. This may be done with a bag and mask or a machine. For this treatment, a tube is placed in your windpipe (trachea) so air and oxygen can flow to the lungs.  Extracorporeal  membrane oxygenation (ECMO). This treatment temporarily takes over the function of the heart and lungs, supplying oxygen and removing carbon dioxide. ECMO gives the lungs a chance to recover. It may be used if a ventilator is not effective.  Tracheostomy. This is a procedure that creates a hole in the neck to insert a breathing tube.  Receiving fluids and medicines.  Rocking the bed to help breathing. Follow these instructions at home:  Take over-the-counter and prescription medicines only as told by your health care provider.  Return to normal activities as told by your health care provider. Ask your health care provider what activities are safe for you.  Keep all follow-up visits as told by your health care provider. This is important. How is this prevented? Treating infections and medical conditions that may lead to acute respiratory failure can help prevent the condition from developing. Contact a health care provider if:  You have a fever.  Your symptoms do not improve or they get worse. Get help right away if:  You are having trouble breathing.  You lose consciousness.  Your have cyanosis or turn blue.  You develop a rapid heart rate.  You are confused. These symptoms may represent a serious problem that is an emergency. Do not wait to see if the symptoms will go away. Get medical help right away. Call your local emergency services (911 in the [place] Do not drive yourself to the hospital. This information is not intended to replace advice given to you by your health care provider. Make sure you discuss any questions you have with your health care provider. Document Revised: 07/14/2017 Document Reviewed: 02/17/2016 Elsevier Patient Education  [place] on my medicine - ELIQUIS (apixaban)  This medication education was reviewed with me or my healthcare representative as part of my discharge preparation.  Why was Eliquis prescribed for you? Eliquis  was prescribed to treat blood clots that may have been found in the veins of your legs (deep vein thrombosis) or in your lungs (pulmonary embolism) and to reduce the risk of them occurring again.  What do You need to know about Eliquis ? The starting dose is 10 mg (two 5 mg tablets) taken TWICE daily for the FIRST SEVEN (7) DAYS, then on 08/07/2020  the dose is reduced to ONE 5 mg tablet taken TWICE daily.  Eliquis may be taken with or without food.   Try to take the dose about the same time in the morning and in the evening. If you have difficulty swallowing the tablet whole please discuss with your pharmacist how to take the medication safely.  Take Eliquis exactly as prescribed and DO NOT stop taking Eliquis without talking to the doctor who prescribed the medication.  Stopping may increase your risk of developing a new blood clot.  Refill your prescription before you run out.  After discharge, you should have regular check-up appointments with your healthcare provider that is prescribing your Eliquis.    What do you do if you miss a dose? If a dose of ELIQUIS is not taken at the scheduled time, take it as soon as possible on the same day and twice-daily administration should be resumed. The dose should  not be doubled to make up for a missed dose.  Important Safety Information A possible side effect of Eliquis is bleeding. You should call your healthcare provider right away if you experience any of the following: ? Bleeding from an injury or your nose that does not stop. ? Unusual colored urine (red or dark brown) or unusual colored stools (red or black). ? Unusual bruising for unknown reasons. ? A serious fall or if you hit your head (even if there is no bleeding).  Some medicines may interact with Eliquis and might increase your risk of bleeding or clotting while on Eliquis. To help avoid this, consult your healthcare provider or pharmacist prior to using any new prescription or  non-prescription medications, including herbals, vitamins, non-steroidal anti-inflammatory drugs (NSAIDs) and supplements.  This website has more information on Eliquis (apixaban): http://www.eliquis.com/eliquis/home

## 2020-08-01 LAB — CULTURE, BLOOD (ROUTINE X 2): Culture: NO GROWTH

## 2020-08-12 ENCOUNTER — Other Ambulatory Visit (HOSPITAL_COMMUNITY): Payer: Self-pay | Admitting: Interventional Radiology

## 2020-08-12 DIAGNOSIS — I729 Aneurysm of unspecified site: Secondary | ICD-10-CM

## 2020-09-04 ENCOUNTER — Other Ambulatory Visit: Payer: Self-pay

## 2020-09-04 ENCOUNTER — Ambulatory Visit (HOSPITAL_COMMUNITY)
Admission: RE | Admit: 2020-09-04 | Discharge: 2020-09-04 | Disposition: A | Discharge status: Home / Self Care | Payer: Medicare HMO | Source: Ambulatory Visit | Attending: Interventional Radiology | Admitting: Interventional Radiology

## 2020-09-04 DIAGNOSIS — I729 Aneurysm of unspecified site: Secondary | ICD-10-CM

## 2020-09-04 DIAGNOSIS — I671 Cerebral aneurysm, nonruptured: Secondary | ICD-10-CM | POA: Insufficient documentation

## 2020-09-07 ENCOUNTER — Telehealth (HOSPITAL_COMMUNITY): Payer: Self-pay

## 2020-09-07 NOTE — Telephone Encounter (Signed)
Pt's daughter agreed to f/u in 6 months with [facility] head wo. AW 

## 2021-07-02 ENCOUNTER — Encounter: Payer: Self-pay | Admitting: Nurse Practitioner

## 2021-07-02 ENCOUNTER — Other Ambulatory Visit: Payer: Self-pay

## 2021-07-02 ENCOUNTER — Ambulatory Visit (INDEPENDENT_AMBULATORY_CARE_PROVIDER_SITE_OTHER): Payer: Medicare HMO | Admitting: Nurse Practitioner

## 2021-07-02 VITALS — BP 133/67 | HR 53 | Temp 97.5°F | Ht 70.0 in | Wt 171.0 lb

## 2021-07-02 DIAGNOSIS — I10 Essential (primary) hypertension: Secondary | ICD-10-CM | POA: Diagnosis not present

## 2021-07-02 DIAGNOSIS — E039 Hypothyroidism, unspecified: Secondary | ICD-10-CM | POA: Diagnosis not present

## 2021-07-02 DIAGNOSIS — K219 Gastro-esophageal reflux disease without esophagitis: Secondary | ICD-10-CM

## 2021-07-02 DIAGNOSIS — J0140 Acute pansinusitis, unspecified: Secondary | ICD-10-CM | POA: Diagnosis not present

## 2021-07-02 DIAGNOSIS — R0602 Shortness of breath: Secondary | ICD-10-CM

## 2021-07-02 DIAGNOSIS — Z7689 Persons encountering health services in other specified circumstances: Secondary | ICD-10-CM

## 2021-07-02 MED ORDER — OMEPRAZOLE 20 MG PO CPDR: 20.0000 mg | DELAYED_RELEASE_CAPSULE | Freq: Every day | ORAL | 3 refills | Status: DC

## 2021-07-02 MED ORDER — AMOXICILLIN 875 MG PO TABS
875.0000 mg | ORAL_TABLET | Freq: Two times a day (BID) | ORAL | 0 refills | Status: DC
Start: 2021-07-02 — End: 2021-07-23

## 2021-07-02 NOTE — Progress Notes (Signed)
New Patient Office Visit  Subjective:  Patient ID: Angel Keller., male    DOB: [birth year]  Age: 80 y.o. MRN: [id number]  CC:  Chief Complaint  Patient presents with   New Patient (Initial Visit)     HPI [place] presents to establish new primary care provider. He is coming from another local provider who has moved most of his care to remote visits. He states that he has been "feeling lousy" for past two weeks. Has been short-winded. Has a bit of a cough. He states that he did have COVID 19 earlier this year with pneumonia in the left lower lung. He states that since he had covid, he has been a little short winded. He states that feeling like this is very unusual as he works out several days per week and has been avid in [employer]. He has history of acid reflux. He attributes this to having ill fitting dentures. Has trouble chewing his food.  The patient states that about two weeks ago he did have blood work to check his thyroid and sugar levels. He has never heard the results of the lab work. He has had overactive thyroid. Does take levothyroxine for this.   Past Medical History:  Diagnosis Date   Aneurysm ([place])    Anxiety    Arthritis    Depression    Headache    HLD (hyperlipidemia)    Hx of inguinal hernia repair    Hypertension    Hypothyroidism    Overactive bladder    Pneumonia    Shingles    Thyroid disease     Past Surgical History:  Procedure Laterality Date   BILATERAL CARPAL TUNNEL RELEASE     CATARACT EXTRACTION, BILATERAL Bilateral    ELBOW SURGERY Bilateral    INGUINAL HERNIA REPAIR Right 1951   IR ANGIO INTRA EXTRACRAN SEL COM CAROTID INNOMINATE BILAT MOD SED  02/20/2019   IR GENERIC HISTORICAL  10/28/2016   IR RADIOLOGIST EVAL & MGMT 10/28/2016 [facility]   RADIOLOGY WITH ANESTHESIA N/A 10/21/2015   Procedure: RADIOLOGY WITH ANESTHESIA;  Surgeon: Luanne Bras, MD;  Location: [facility];  Service: Radiology;  Laterality: N/A;   RADIOLOGY WITH  ANESTHESIA N/A 02/20/2019   Procedure: [employer];  Surgeon: Luanne Bras, MD;  Location: [facility];  Service: Radiology;  Laterality: N/A;   ROTATOR CUFF REPAIR Bilateral    VARICOSE VEIN SURGERY      Family History  Problem Relation Age of Onset   Breast cancer Mother    Lung cancer Father    Alcoholism Sister    Cirrhosis Sister    Colon cancer Neg Hx    Esophageal cancer Neg Hx    Liver cancer Neg Hx    Pancreatic cancer Neg Hx    Prostate cancer Neg Hx     Social History   Socioeconomic History   Marital status: Widowed    Spouse name: Not on file   Number of children: 5   Years of education: Not on file   Highest education level: Not on file  Occupational History   Occupation: retired  Tobacco Use   Smoking status: Former    Packs/day: 0.25    Years: 1.00    Pack years: 0.25    Types: Cigarettes    Quit date: 08/15/1968    Years since quitting: 52.9   Smokeless tobacco: Never  Vaping Use   Vaping Use: Never used  Substance and Sexual Activity  Alcohol use: No   Drug use: No   Sexual activity: Not Currently  Other Topics Concern   Not on file  Social History Narrative   Not on file   Social Determinants of Health   Financial Resource Strain: Not on file  Food Insecurity: Not on file  Transportation Needs: Not on file  Physical Activity: Not on file  Stress: Not on file  Social Connections: Not on file  Intimate Partner Violence: Not on file    ROS Review of Systems  Constitutional:  Positive for fatigue. Negative for activity change, chills and fever.  HENT:  Negative for congestion, postnasal drip, rhinorrhea, sinus pressure, sinus pain, sneezing and sore throat.   Eyes: Negative.   Respiratory:  Positive for cough and shortness of breath. Negative for wheezing.   Cardiovascular:  Positive for chest pain. Negative for palpitations.  Gastrointestinal:  Negative for constipation, diarrhea, nausea and vomiting.       Gerd and heartburn    Endocrine: Negative for cold intolerance, heat intolerance, polydipsia and polyuria.       History of overactive thyroid. Has been treated with radioactive iodine and now takes levothyroxine daily.   Genitourinary:  Negative for dysuria, frequency and urgency.  Musculoskeletal:  Negative for back pain and myalgias.  Skin:  Negative for rash.  Allergic/Immunologic: Negative for environmental allergies.  Neurological:  Negative for dizziness, weakness and headaches.  Psychiatric/Behavioral:  The patient is not nervous/anxious.    Objective:   Today's Vitals   07/02/21 1059  BP: 133/67  Pulse: (!) 53  Temp: (!) 97.5 F (36.4 C)  SpO2: 94%  Weight: 171 lb (77.6 kg)  Height: 5\' 10"  (1.778 m)   Body mass index is 24.54 kg/m.   Physical Exam Vitals and nursing note reviewed.  Constitutional:      Appearance: Normal appearance. He is well-developed.  HENT:     Head: Normocephalic and atraumatic.     Nose: Congestion and rhinorrhea present.     Mouth/Throat:     Mouth: Mucous membranes are moist.     Pharynx: Oropharynx is clear.  Eyes:     Extraocular Movements: Extraocular movements intact.     Conjunctiva/sclera: Conjunctivae normal.     Pupils: Pupils are equal, round, and reactive to light.  Cardiovascular:     Rate and Rhythm: Normal rate and regular rhythm.     Pulses: Normal pulses.     Heart sounds: Normal heart sounds.     Comments: ECG during today's visit showing sinus bradycardia. Is otherwise normal.  Pulmonary:     Effort: Pulmonary effort is normal.     Breath sounds: Wheezing present.  Abdominal:     Palpations: Abdomen is soft.  Musculoskeletal:        General: Normal range of motion.     Cervical back: Normal range of motion and neck supple.  Lymphadenopathy:     Cervical: No cervical adenopathy.  Skin:    General: Skin is warm and dry.     Capillary Refill: Capillary refill takes less than 2 seconds.  Neurological:     General: No focal deficit  present.     Mental Status: He is alert and oriented to person, place, and time.  Psychiatric:        Mood and Affect: Mood normal.        Behavior: Behavior normal.        Thought Content: Thought content normal.        Judgment: Judgment  normal.    Assessment & Plan:  1. Encounter to establish care Appointment today to establish new primary care provider. Will get labs and progress notes from previous primary care provider and update patient chart,   2. Acute non-recurrent pansinusitis Start amoxicillin 875 mg twice daily for next 10 days. Rest and increase fluids. Continue using OTC medication to control symptoms.   - amoxicillin (AMOXIL) 875 MG tablet; Take 1 tablet (875 mg total) by mouth 2 (two) times daily.  Dispense: 20 tablet; Refill: 0  3. Shortness of breath EKG today showing sinus bradycardia without other abnormalities.  Will get chest x-ray for further evaluation.  Trial omeprazole 20 mg daily. - DG Chest 2 View; Future - EKG 12-Lead  4. Gastroesophageal reflux disease without esophagitis Trial of omeprazole 20 mg daily.  Encourage patient to avoid trigger foods and to sleep with head of bed raised at 30 degrees. - omeprazole (PRILOSEC) 20 MG capsule; Take 1 capsule (20 mg total) by mouth daily.  Dispense: 30 capsule; Refill: 3  5. Acquired hypothyroidism Will get most recent labs including thyroid panel.  Adjust levothyroxine as indicated.  6. Essential hypertension Blood pressure stable.  Continue medication as prescribed.   Problem List Items Addressed This Visit       Cardiovascular and Mediastinum   Essential hypertension     Respiratory   Acute non-recurrent pansinusitis   Relevant Medications   amoxicillin (AMOXIL) 875 MG tablet     Digestive   GERD   Relevant Medications   omeprazole (PRILOSEC) 20 MG capsule     Endocrine   Acquired hypothyroidism     Other   Shortness of breath   Relevant Orders   DG Chest 2 View   EKG 12-Lead (Completed)    Other Visit Diagnoses     Encounter to establish care    -  Primary       Outpatient Encounter Medications as of 07/02/2021  Medication Sig   allopurinol (ZYLOPRIM) 300 MG tablet Take 300 mg by mouth daily.   amoxicillin (AMOXIL) 875 MG tablet Take 1 tablet (875 mg total) by mouth 2 (two) times daily.   apixaban (ELIQUIS) 5 MG TABS tablet Take eliquis 10 mg (2 tablets) twice a day for 1 week.  Then on 08/07/2020, taper eliquis to 5 mg (1 tab) twice a day for 3 months or until advised to stop by PCP   ascorbic acid (VITAMIN C) 500 MG tablet Take 1 tablet (500 mg total) by mouth daily.   aspirin EC 81 MG tablet Take 1 tablet (81 mg total) by mouth daily. Hold while on blood thinner/eliquis for 3 months, then resume   benzonatate (TESSALON) 100 MG capsule Take 100 mg by mouth 3 (three) times daily.   bisoprolol-hydrochlorothiazide (ZIAC) 2.5-6.25 MG tablet Take 1 tablet by mouth daily.   Cholecalciferol (D3 ADULT PO) Take 2,000 Units by mouth daily.   Cyanocobalamin (B-12 PO) Take 1,000 mcg by mouth daily.   escitalopram (LEXAPRO) 20 MG tablet Take 20 mg by mouth daily.   guaiFENesin-dextromethorphan (ROBITUSSIN DM) 100-10 MG/5ML syrup Take 10 mLs by mouth every 4 (four) hours as needed for cough.   lovastatin (MEVACOR) 20 MG tablet Take 20 mg by mouth daily.   omeprazole (PRILOSEC) 20 MG capsule Take 1 capsule (20 mg total) by mouth daily.   predniSONE (DELTASONE) 10 MG tablet Prednisone dosing: Take  Prednisone 40mg  (4 tabs) x 3 days, then taper to 30mg  (3 tabs) x 3 days, then 20mg  (  2 tabs) x 3days, then 10mg  (1 tab) x 3days, then OFF.   SYNTHROID 112 MCG tablet Take 112 mcg by mouth daily.   zinc sulfate 220 (50 Zn) MG capsule Take 1 capsule (220 mg total) by mouth daily.   [DISCONTINUED] pantoprazole (PROTONIX) 40 MG tablet Take 1 tablet (40 mg total) by mouth daily. While on steroids   No facility-administered encounter medications on file as of 07/02/2021.    Follow-up: Return in  about 4 weeks (around 07/30/2021) for health maintenance exam - we need to get records and recent labs from Dr. Eddie Dibbles office.   Ronnell Freshwater, NP  This note was dictated using Systems analyst. Rapid proofreading was performed to expedite the delivery of the information. Despite proofreading, phonetic errors will occur which are common with this voice recognition software. Please take this into consideration. If there are any concerns, please contact our office.

## 2021-07-05 NOTE — Progress Notes (Signed)
Reviewed with patient during visit

## 2021-07-11 DIAGNOSIS — R0602 Shortness of breath: Secondary | ICD-10-CM | POA: Insufficient documentation

## 2021-07-11 DIAGNOSIS — J0140 Acute pansinusitis, unspecified: Secondary | ICD-10-CM | POA: Insufficient documentation

## 2021-07-12 ENCOUNTER — Other Ambulatory Visit: Payer: Self-pay | Admitting: Nurse Practitioner

## 2021-07-12 ENCOUNTER — Telehealth: Payer: Self-pay | Admitting: Nurse Practitioner

## 2021-07-12 DIAGNOSIS — K219 Gastro-esophageal reflux disease without esophagitis: Secondary | ICD-10-CM

## 2021-07-12 MED ORDER — PANTOPRAZOLE SODIUM 40 MG PO TBEC: 40.0000 mg | DELAYED_RELEASE_TABLET | Freq: Every day | ORAL | 2 refills | Status: DC

## 2021-07-12 NOTE — Telephone Encounter (Signed)
Patient called back and stated he was on the phone when you tried calling him, when you get a chance do you mind calling him back? [phone]

## 2021-07-12 NOTE — Telephone Encounter (Signed)
Called pt no answer no VM to leave a message

## 2021-07-12 NOTE — Telephone Encounter (Signed)
Patient called stating that he has indigestion extremely bad and the omeprazole is not helping at all. Is there anything else he can take? [phone]

## 2021-07-12 NOTE — Telephone Encounter (Signed)
Please let the patient know that he can stop omeprazole. Will do trial of pantoprazole 40mg  daily. I sent new prescription to his pharmacy, [facility] [address]. Thanks.

## 2021-07-13 NOTE — Telephone Encounter (Signed)
This has been taken care of. AS, CMA

## 2021-07-14 ENCOUNTER — Ambulatory Visit: Payer: Medicare HMO | Admitting: Nurse Practitioner

## 2021-07-18 ENCOUNTER — Emergency Department (HOSPITAL_COMMUNITY): Payer: Medicare HMO

## 2021-07-18 ENCOUNTER — Inpatient Hospital Stay (HOSPITAL_COMMUNITY)
Admission: EM | Admit: 2021-07-18 | Discharge: 2021-07-23 | DRG: 445 | Disposition: A | Discharge status: Home / Self Care | Payer: Medicare HMO | Attending: Internal Medicine | Admitting: Internal Medicine

## 2021-07-18 ENCOUNTER — Other Ambulatory Visit: Payer: Self-pay

## 2021-07-18 ENCOUNTER — Encounter (HOSPITAL_COMMUNITY): Payer: Self-pay | Admitting: Emergency Medicine

## 2021-07-18 DIAGNOSIS — K760 Fatty (change of) liver, not elsewhere classified: Secondary | ICD-10-CM | POA: Diagnosis present

## 2021-07-18 DIAGNOSIS — K8689 Other specified diseases of pancreas: Secondary | ICD-10-CM

## 2021-07-18 DIAGNOSIS — E039 Hypothyroidism, unspecified: Secondary | ICD-10-CM | POA: Diagnosis not present

## 2021-07-18 DIAGNOSIS — Z8619 Personal history of other infectious and parasitic diseases: Secondary | ICD-10-CM

## 2021-07-18 DIAGNOSIS — E785 Hyperlipidemia, unspecified: Secondary | ICD-10-CM | POA: Diagnosis present

## 2021-07-18 DIAGNOSIS — I2510 Atherosclerotic heart disease of native coronary artery without angina pectoris: Secondary | ICD-10-CM | POA: Diagnosis present

## 2021-07-18 DIAGNOSIS — K219 Gastro-esophageal reflux disease without esophagitis: Secondary | ICD-10-CM | POA: Diagnosis present

## 2021-07-18 DIAGNOSIS — K831 Obstruction of bile duct: Principal | ICD-10-CM | POA: Diagnosis present

## 2021-07-18 DIAGNOSIS — Z539 Procedure and treatment not carried out, unspecified reason: Secondary | ICD-10-CM | POA: Diagnosis not present

## 2021-07-18 DIAGNOSIS — Z7982 Long term (current) use of aspirin: Secondary | ICD-10-CM

## 2021-07-18 DIAGNOSIS — Z79890 Hormone replacement therapy (postmenopausal): Secondary | ICD-10-CM

## 2021-07-18 DIAGNOSIS — R718 Other abnormality of red blood cells: Secondary | ICD-10-CM | POA: Diagnosis present

## 2021-07-18 DIAGNOSIS — Z79899 Other long term (current) drug therapy: Secondary | ICD-10-CM

## 2021-07-18 DIAGNOSIS — G8929 Other chronic pain: Secondary | ICD-10-CM | POA: Diagnosis present

## 2021-07-18 DIAGNOSIS — F32A Depression, unspecified: Secondary | ICD-10-CM | POA: Diagnosis not present

## 2021-07-18 DIAGNOSIS — Z87891 Personal history of nicotine dependence: Secondary | ICD-10-CM

## 2021-07-18 DIAGNOSIS — Z8616 Personal history of COVID-19: Secondary | ICD-10-CM

## 2021-07-18 DIAGNOSIS — Z800 Family history of malignant neoplasm of digestive organs: Secondary | ICD-10-CM

## 2021-07-18 DIAGNOSIS — I10 Essential (primary) hypertension: Secondary | ICD-10-CM | POA: Diagnosis present

## 2021-07-18 DIAGNOSIS — M549 Dorsalgia, unspecified: Secondary | ICD-10-CM | POA: Diagnosis present

## 2021-07-18 DIAGNOSIS — F419 Anxiety disorder, unspecified: Secondary | ICD-10-CM | POA: Diagnosis not present

## 2021-07-18 DIAGNOSIS — N3281 Overactive bladder: Secondary | ICD-10-CM | POA: Diagnosis present

## 2021-07-18 DIAGNOSIS — Z8701 Personal history of pneumonia (recurrent): Secondary | ICD-10-CM

## 2021-07-18 DIAGNOSIS — D689 Coagulation defect, unspecified: Secondary | ICD-10-CM | POA: Diagnosis present

## 2021-07-18 DIAGNOSIS — E876 Hypokalemia: Secondary | ICD-10-CM | POA: Diagnosis present

## 2021-07-18 DIAGNOSIS — M1990 Unspecified osteoarthritis, unspecified site: Secondary | ICD-10-CM | POA: Diagnosis present

## 2021-07-18 DIAGNOSIS — R1013 Epigastric pain: Secondary | ICD-10-CM | POA: Diagnosis not present

## 2021-07-18 DIAGNOSIS — R17 Unspecified jaundice: Secondary | ICD-10-CM

## 2021-07-18 DIAGNOSIS — Z7901 Long term (current) use of anticoagulants: Secondary | ICD-10-CM

## 2021-07-18 DIAGNOSIS — Z20822 Contact with and (suspected) exposure to covid-19: Secondary | ICD-10-CM | POA: Diagnosis present

## 2021-07-18 DIAGNOSIS — E871 Hypo-osmolality and hyponatremia: Secondary | ICD-10-CM | POA: Diagnosis present

## 2021-07-18 DIAGNOSIS — I700 Atherosclerosis of aorta: Secondary | ICD-10-CM | POA: Diagnosis present

## 2021-07-18 DIAGNOSIS — D490 Neoplasm of unspecified behavior of digestive system: Secondary | ICD-10-CM | POA: Diagnosis present

## 2021-07-18 LAB — CBC WITH DIFFERENTIAL/PLATELET
Abs Immature Granulocytes: 0.07 10*3/uL (ref 0.00–0.07)
Basophils Absolute: 0 10*3/uL (ref 0.0–0.1)
Basophils Relative: 0 %
Eosinophils Absolute: 0.1 10*3/uL (ref 0.0–0.5)
Eosinophils Relative: 1 %
HCT: 37.3 % — ABNORMAL LOW (ref 39.0–52.0)
Hemoglobin: 12.3 g/dL — ABNORMAL LOW (ref 13.0–17.0)
Immature Granulocytes: 1 %
Lymphocytes Relative: 18 %
Lymphs Abs: 1.5 10*3/uL (ref 0.7–4.0)
MCH: 26.9 pg (ref 26.0–34.0)
MCHC: 33 g/dL (ref 30.0–36.0)
MCV: 81.6 fL (ref 80.0–100.0)
Monocytes Absolute: 1.1 10*3/uL — ABNORMAL HIGH (ref 0.1–1.0)
Monocytes Relative: 12 %
Neutro Abs: 5.9 10*3/uL (ref 1.7–7.7)
Neutrophils Relative %: 68 %
Platelets: 214 10*3/uL (ref 150–400)
RBC: 4.57 MIL/uL (ref 4.22–5.81)
RDW: 20.2 % — ABNORMAL HIGH (ref 11.5–15.5)
WBC: 8.6 10*3/uL (ref 4.0–10.5)
nRBC: 0 % (ref 0.0–0.2)

## 2021-07-18 LAB — URINALYSIS, ROUTINE W REFLEX MICROSCOPIC
Bacteria, UA: NONE SEEN
Glucose, UA: NEGATIVE mg/dL
Ketones, ur: NEGATIVE mg/dL
Leukocytes,Ua: NEGATIVE
Nitrite: NEGATIVE
Protein, ur: 100 mg/dL — AB
Specific Gravity, Urine: 1.026 (ref 1.005–1.030)
pH: 5 (ref 5.0–8.0)

## 2021-07-18 LAB — COMPREHENSIVE METABOLIC PANEL
ALT: 134 U/L — ABNORMAL HIGH (ref 0–44)
AST: 92 U/L — ABNORMAL HIGH (ref 15–41)
Albumin: 3.2 g/dL — ABNORMAL LOW (ref 3.5–5.0)
Alkaline Phosphatase: 385 U/L — ABNORMAL HIGH (ref 38–126)
Anion gap: 8 (ref 5–15)
BUN: 14 mg/dL (ref 8–23)
CO2: 25 mmol/L (ref 22–32)
Calcium: 9.2 mg/dL (ref 8.9–10.3)
Chloride: 100 mmol/L (ref 98–111)
Creatinine, Ser: 1.27 mg/dL — ABNORMAL HIGH (ref 0.61–1.24)
GFR, Estimated: 57 mL/min — ABNORMAL LOW (ref >60)
Glucose, Bld: 156 mg/dL — ABNORMAL HIGH (ref 70–99)
Potassium: 4 mmol/L (ref 3.5–5.1)
Sodium: 133 mmol/L — ABNORMAL LOW (ref 135–145)
Total Bilirubin: 15.9 mg/dL — ABNORMAL HIGH (ref 0.3–1.2)
Total Protein: 7 g/dL (ref 6.5–8.1)

## 2021-07-18 LAB — HEPATITIS PANEL, ACUTE
HCV Ab: NONREACTIVE
Hep A IgM: NONREACTIVE
Hep B C IgM: NONREACTIVE
Hepatitis B Surface Ag: NONREACTIVE

## 2021-07-18 LAB — RESP PANEL BY RT-PCR (FLU A&B, COVID) ARPGX2
Influenza A by PCR: NEGATIVE
Influenza B by PCR: NEGATIVE
SARS Coronavirus 2 by RT PCR: NEGATIVE

## 2021-07-18 LAB — ETHANOL: Alcohol, Ethyl (B): <10 mg/dL (ref <10)

## 2021-07-18 LAB — LIPASE, BLOOD: Lipase: 69 U/L — ABNORMAL HIGH (ref 11–51)

## 2021-07-18 LAB — LACTATE DEHYDROGENASE: LDH: 172 U/L (ref 98–192)

## 2021-07-18 LAB — TROPONIN I (HIGH SENSITIVITY): Troponin I (High Sensitivity): 11 ng/L (ref <18)

## 2021-07-18 LAB — ACETAMINOPHEN LEVEL: Acetaminophen (Tylenol), Serum: <10 ug/mL — ABNORMAL LOW (ref 10–30)

## 2021-07-18 MED ORDER — PRAVASTATIN SODIUM 10 MG PO TABS: 20.0000 mg | ORAL_TABLET | Freq: Every day | ORAL | Status: DC

## 2021-07-18 MED ORDER — BISOPROLOL-HYDROCHLOROTHIAZIDE 2.5-6.25 MG PO TABS
1.0000 | ORAL_TABLET | Freq: Every day | ORAL | Status: DC
  Administered 2021-07-19 – 2021-07-23 (×5): 1 via ORAL
  Filled 2021-07-18 (×5): qty 1

## 2021-07-18 MED ORDER — ESCITALOPRAM OXALATE 20 MG PO TABS
20.0000 mg | ORAL_TABLET | Freq: Every day | ORAL | Status: DC
  Administered 2021-07-19 – 2021-07-23 (×5): 20 mg via ORAL
  Filled 2021-07-18 (×2): qty 1
  Filled 2021-07-18: qty 2
  Filled 2021-07-18 (×3): qty 1

## 2021-07-18 MED ORDER — SODIUM CHLORIDE 0.9% FLUSH
3.0000 mL | Freq: Two times a day (BID) | INTRAVENOUS | Status: DC
  Administered 2021-07-19 – 2021-07-23 (×9): 3 mL via INTRAVENOUS

## 2021-07-18 MED ORDER — IOHEXOL 300 MG/ML  SOLN
80.0000 mL | Freq: Once | INTRAMUSCULAR | Status: AC | PRN
  Administered 2021-07-18: 15:00:00 80 mL via INTRAVENOUS

## 2021-07-18 MED ORDER — ASPIRIN EC 81 MG PO TBEC: 81.0000 mg | DELAYED_RELEASE_TABLET | Freq: Every day | ORAL | Status: DC

## 2021-07-18 MED ORDER — SODIUM CHLORIDE 0.9 % IV SOLN: INTRAVENOUS | Status: AC

## 2021-07-18 MED ORDER — LEVOTHYROXINE SODIUM 112 MCG PO TABS: 112.0000 ug | ORAL_TABLET | Freq: Every day | ORAL | Status: DC

## 2021-07-18 MED ORDER — ONDANSETRON HCL 4 MG/2ML IJ SOLN
4.0000 mg | Freq: Four times a day (QID) | INTRAMUSCULAR | Status: DC | PRN
  Administered 2021-07-21: 4 mg via INTRAVENOUS
  Filled 2021-07-18: qty 2

## 2021-07-18 MED ORDER — PANTOPRAZOLE SODIUM 40 MG PO TBEC
40.0000 mg | DELAYED_RELEASE_TABLET | Freq: Every day | ORAL | Status: DC
  Administered 2021-07-19 – 2021-07-23 (×5): 40 mg via ORAL
  Filled 2021-07-18 (×5): qty 1

## 2021-07-18 MED ORDER — ALLOPURINOL 300 MG PO TABS: 300.0000 mg | ORAL_TABLET | Freq: Every day | ORAL | Status: DC

## 2021-07-18 MED ORDER — ACETAMINOPHEN 650 MG RE SUPP: 650.0000 mg | Freq: Four times a day (QID) | RECTAL | Status: DC | PRN

## 2021-07-18 MED ORDER — LEVOTHYROXINE SODIUM 112 MCG PO TABS
112.0000 ug | ORAL_TABLET | Freq: Every day | ORAL | Status: DC
Start: 2021-07-19 — End: 2021-07-23
  Administered 2021-07-19 – 2021-07-23 (×5): 112 ug via ORAL
  Filled 2021-07-18 (×6): qty 1

## 2021-07-18 MED ORDER — POLYETHYLENE GLYCOL 3350 17 G PO PACK: 17.0000 g | PACK | Freq: Every day | ORAL | Status: DC | PRN

## 2021-07-18 MED ORDER — ACETAMINOPHEN 325 MG PO TABS
650.0000 mg | ORAL_TABLET | Freq: Four times a day (QID) | ORAL | Status: DC | PRN
  Administered 2021-07-18 – 2021-07-22 (×3): 650 mg via ORAL
  Filled 2021-07-18 (×4): qty 2

## 2021-07-18 MED ORDER — FAMOTIDINE 20 MG PO TABS
20.0000 mg | ORAL_TABLET | Freq: Every day | ORAL | Status: DC
  Administered 2021-07-18 – 2021-07-23 (×6): 20 mg via ORAL
  Filled 2021-07-18 (×6): qty 1

## 2021-07-18 NOTE — ED Notes (Signed)
Pt alert, NAD, calm, itneractive, resps e/u. EDP at [facility]. Family at [facility].

## 2021-07-18 NOTE — ED Provider Notes (Signed)
Emergency Medicine Provider Triage Evaluation Note  Angel Keller Sr. , Keller 81 y.o. male  was evaluated in triage.  Pt complains of continuous epigastric pain x3 weeks.  His family is at bedside and notes that he has appeared jaundiced since Thanksgiving.  He has associated nausea.  He denies alcohol use, Tylenol, IV drug use, or hepatitis.  He denies chest pain, shortness of breath, vomiting, fever, chills.  He still has his gallbladder.  Review of Systems  Positive: Epigastric pain Negative: Fever, chills  Physical Exam  BP (!) 171/79   Pulse (!) 52   Temp (!) 97.4 F (36.3 C) (Oral)   Resp 12   SpO2 97%  Gen:   Awake, no distress   Resp:  Normal effort  MSK:   Moves extremities without difficulty  Other:  Mild tenderness to palpation to epigastric region.  Jaundice noted to entire body.  Scleral icterus noted bilaterally.  Medical Decision Making  Medically screening exam initiated at 12:23 PM.  Appropriate orders placed.  Angel Celeste Sr. was informed that the remainder of the evaluation will be completed by another provider, this initial triage assessment does not replace that evaluation, and the importance of remaining in the ED until their evaluation is complete.    Angel Earwood A, PA-C 07/18/21 1226    Angel Muskrat, MD 07/18/21 1323

## 2021-07-18 NOTE — ED Notes (Signed)
Gold save tube sent to main lab

## 2021-07-18 NOTE — ED Provider Notes (Signed)
[facility] EMERGENCY DEPARTMENT Provider Note   CSN: [id number] Arrival date & time: 07/18/21  1150     History Chief Complaint  Patient presents with   Abdominal Pain   Jaundice    Angel Keller. is a 80 y.o. male.  HPI Patient presents with his son who assists with history.  Patient notes that he is stubborn, thus has waited 3 weeks for evaluation.  About 3 weeks ago he began having dyspepsia.  He saw a physician was started on appropriate meds, but has had no change in his condition.  He describes discomfort in the xiphoid region, nonradiating, unsettled, uncomfortable.  There is associated new early satiety, and jaundice with scleral icterus that developed over the past 2 days.    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[place]) 07/18/2021   Pancreatic mass 07/18/2021   Acute non-recurrent pansinusitis 07/11/2021   Shortness of breath 07/11/2021   Acute respiratory failure due to COVID-19 [facility]) 07/27/2020   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   Acquired hypothyroidism 07/15/2009   FATIGUE 07/15/2009   SORE THROAT 06/11/2009   ALLERGIC RHINITIS 05/25/2009   HOARSENESS 05/25/2009   HYPERTHYROIDISM 11/07/2008   Essential hypertension 11/07/2008   DIVERTICULOSIS, COLON 11/07/2008   HLD (hyperlipidemia) 10/04/2007   Anxiety 10/04/2007   Depression 10/04/2007   GERD 10/04/2007   COLONIC POLYPS, HX OF 10/04/2007    Past Surgical History:  Procedure Laterality Date   BILATERAL CARPAL TUNNEL RELEASE     CATARACT EXTRACTION, BILATERAL Bilateral    ELBOW SURGERY Bilateral    INGUINAL HERNIA  REPAIR Right 1951   IR ANGIO INTRA EXTRACRAN SEL COM CAROTID INNOMINATE BILAT MOD SED  02/20/2019   IR GENERIC HISTORICAL  10/28/2016   IR RADIOLOGIST EVAL & MGMT 10/28/2016 [facility]-INTERV RAD   RADIOLOGY WITH ANESTHESIA N/A 10/21/2015   Procedure: RADIOLOGY WITH ANESTHESIA;  Surgeon: Luanne Bras, MD;  Location: [place];  Service: Radiology;  Laterality: N/A;   RADIOLOGY WITH ANESTHESIA N/A 02/20/2019   Procedure: [employer];  Surgeon: Luanne Bras, MD;  Location: [place];  Service: Radiology;  Laterality: N/A;   ROTATOR CUFF REPAIR Bilateral    VARICOSE VEIN SURGERY         Family History  Problem Relation Age of Onset   Breast cancer Mother    Lung cancer Father    Alcoholism Sister    Cirrhosis Sister    Colon cancer Neg Hx    Esophageal cancer Neg Hx    Liver cancer Neg Hx    Pancreatic cancer Neg Hx    Prostate cancer Neg Hx     Social History   Tobacco Use   Smoking status: Former    Packs/day: 0.25    Years: 1.00    Pack years: 0.25    Types: Cigarettes    Quit date: 08/15/1968    Years since quitting: 52.9   Smokeless tobacco: Never  Vaping Use   Vaping Use: Never used  Substance Use Topics  Alcohol use: No   Drug use: No    Home Medications Prior to Admission medications   Medication Sig Start Date End Date Taking? Authorizing Provider  allopurinol (ZYLOPRIM) 300 MG tablet Take 300 mg by mouth daily. 02/04/20   [provider]  amoxicillin (AMOXIL) 875 MG tablet Take 1 tablet (875 mg total) by mouth 2 (two) times daily. 07/02/21   Ronnell Freshwater, NP  apixaban (ELIQUIS) 5 MG TABS tablet Take eliquis 10 mg (2 tablets) twice a day for 1 week.  Then on 08/07/2020, taper eliquis to 5 mg (1 tab) twice a day for 3 months or until advised to stop by PCP 07/30/20   Rai, Vernelle Emerald, MD  ascorbic acid (VITAMIN C) 500 MG tablet Take 1 tablet (500 mg total) by mouth daily. 07/31/20   Rai, Vernelle Emerald, MD  aspirin EC 81 MG tablet Take 1 tablet (81 mg total) by  mouth daily. Hold while on blood thinner/eliquis for 3 months, then resume 07/30/20   Rai, Ripudeep K, MD  benzonatate (TESSALON) 100 MG capsule Take 100 mg by mouth 3 (three) times daily. 07/20/20   [provider]  bisoprolol-hydrochlorothiazide (ZIAC) 2.5-6.25 MG tablet Take 1 tablet by mouth daily. 06/20/15   [provider]  Cholecalciferol (D3 ADULT PO) Take 2,000 Units by mouth daily.    [provider]  Cyanocobalamin (B-12 PO) Take 1,000 mcg by mouth daily.    [provider]  escitalopram (LEXAPRO) 20 MG tablet Take 20 mg by mouth daily. 12/22/18   [provider]  guaiFENesin-dextromethorphan (ROBITUSSIN DM) 100-10 MG/5ML syrup Take 10 mLs by mouth every 4 (four) hours as needed for cough. 07/30/20   Rai, Vernelle Emerald, MD  lovastatin (MEVACOR) 20 MG tablet Take 20 mg by mouth daily. 07/25/15   [provider]  pantoprazole (PROTONIX) 40 MG tablet Take 1 tablet (40 mg total) by mouth daily. 07/12/21   Ronnell Freshwater, NP  predniSONE (DELTASONE) 10 MG tablet Prednisone dosing: Take  Prednisone 40mg  (4 tabs) x 3 days, then taper to 30mg  (3 tabs) x 3 days, then 20mg  (2 tabs) x 3days, then 10mg  (1 tab) x 3days, then OFF. 07/30/20   Rai, Ripudeep K, MD  SYNTHROID 112 MCG tablet Take 112 mcg by mouth daily. 07/02/20   [provider]  zinc sulfate 220 (50 Zn) MG capsule Take 1 capsule (220 mg total) by mouth daily. 07/31/20   Mendel Corning, MD    Allergies    Patient has no known allergies.  Review of Systems   Review of Systems  Constitutional:        Per HPI, otherwise negative  HENT:         Per HPI, otherwise negative  Respiratory:         Per HPI, otherwise negative  Cardiovascular:        Per HPI, otherwise negative  Gastrointestinal:  Positive for nausea. Negative for vomiting.  Endocrine:       Negative aside from HPI  Genitourinary:        Neg aside from HPI   Musculoskeletal:        Per HPI, otherwise negative   Skin:  Positive for color change.  Neurological:  Negative for syncope.   Physical Exam Updated Vital Signs BP (!) 155/72   Pulse (!) 57   Temp (!) 97.5 F (36.4 C) (Oral)   Resp (!) 24   SpO2 99%   Physical Exam Vitals and nursing  note reviewed.  Constitutional:      General: He is not in acute distress.    Appearance: He is well-developed.  HENT:     Head: Normocephalic and atraumatic.  Eyes:     General: Scleral icterus present.     Conjunctiva/sclera: Conjunctivae normal.  Cardiovascular:     Rate and Rhythm: Normal rate and regular rhythm.  Pulmonary:     Effort: Pulmonary effort is normal. No respiratory distress.     Breath sounds: No stridor.  Abdominal:     General: There is no distension.     Tenderness: There is no abdominal tenderness.  Skin:    General: Skin is warm and dry.     Coloration: Skin is jaundiced.  Neurological:     Mental Status: He is alert and oriented to person, place, and time.    ED Results / Procedures / Treatments   Labs (all labs ordered are listed, but only abnormal results are displayed) Labs Reviewed  CBC WITH DIFFERENTIAL/PLATELET - Abnormal; Notable for the following components:      Result Value   Hemoglobin 12.3 (*)    HCT 37.3 (*)    RDW 20.2 (*)    Monocytes Absolute 1.1 (*)    All other components within normal limits  COMPREHENSIVE METABOLIC PANEL - Abnormal; Notable for the following components:   Sodium 133 (*)    Glucose, Bld 156 (*)    Creatinine, Ser 1.27 (*)    Albumin 3.2 (*)    AST 92 (*)    ALT 134 (*)    Alkaline Phosphatase 385 (*)    Total Bilirubin 15.9 (*)    GFR, Estimated 57 (*)    All other components within normal limits  LIPASE, BLOOD - Abnormal; Notable for the following components:   Lipase 69 (*)    All other components within normal limits  URINALYSIS, ROUTINE W REFLEX MICROSCOPIC - Abnormal; Notable for the following components:   Color, Urine AMBER (*)    Hgb urine dipstick SMALL  (*)    Bilirubin Urine MODERATE (*)    Protein, ur 100 (*)    All other components within normal limits  ACETAMINOPHEN LEVEL - Abnormal; Notable for the following components:   Acetaminophen (Tylenol), Serum <10 (*)    All other components within normal limits  RESP PANEL BY RT-PCR (FLU A&B, COVID) ARPGX2  ETHANOL  HEPATITIS PANEL, ACUTE  LACTATE DEHYDROGENASE  HAPTOGLOBIN  COMPREHENSIVE METABOLIC PANEL  CBC  CANCER ANTIGEN 19-9  TROPONIN I (HIGH SENSITIVITY)    EKG None  Radiology DG Chest 2 View  Result Date: 07/18/2021 CLINICAL DATA:  Epigastric pain and nausea. EXAM: CHEST - 2 VIEW COMPARISON:  July 27, 2020 FINDINGS: Tortuosity of the aorta. Cardiomediastinal silhouette is normal. Mediastinal contours appear intact. Linear peribronchial opacities in bilateral lung bases, left greater than right. Osseous structures are without acute abnormality. Soft tissues are grossly normal. IMPRESSION: Linear peribronchial opacities in bilateral lung bases, left greater than right. This may represent atelectasis, bronchitic changes or atypical pneumonia. Electronically Signed   By: Fidela Salisbury M.D.   On: 07/18/2021 13:50   CT Abdomen Pelvis W Contrast  Result Date: 07/18/2021 CLINICAL DATA:  Epigastric pain. EXAM: CT ABDOMEN AND PELVIS WITH CONTRAST TECHNIQUE: Multidetector CT imaging of the abdomen and pelvis was performed using the standard protocol following bolus administration of intravenous contrast. CONTRAST:  82mL OMNIPAQUE IOHEXOL 300 MG/ML  SOLN COMPARISON:  Jan 09, 2019 FINDINGS: Lower chest: Heavy calcific atherosclerotic  disease of the coronary arteries. Hepatobiliary: Intrahepatic and extrahepatic biliary ductal dilation. Otherwise normal attenuation of the liver. The gallbladder is normally distended. The extrahepatic common bile duct measures 1.4 cm. Pancreas: Suspected hypoattenuated mass within the proximal body of the pancreas measures 2.3 x 2.6 x 2.0 cm. There is a  downstream dilation of the main pancreatic duct and its branches. It is likely the cause for the biliary ductal dilation as well as it compresses the ampulla. Spleen: Normal in size without focal abnormality. Adrenals/Urinary Tract: Adrenal glands are unremarkable. Kidneys are normal, without renal calculi, focal lesion, or hydronephrosis. Bladder is unremarkable. Stomach/Bowel: Stomach is within normal limits. No evidence of bowel wall thickening, distention, or inflammatory changes. Vascular/Lymphatic: Aortic atherosclerosis. No enlarged abdominal or pelvic lymph nodes. Reproductive: Prostate is unremarkable. Other: No abdominal wall hernia or abnormality. No abdominopelvic ascites. Musculoskeletal: Spondylosis of the thoracolumbar spine. IMPRESSION: 1. Suspected solid mass within the proximal body of the pancreas measures 2.3 x 2.6 x 2.0 cm. There is downstream dilation of the main pancreatic duct and its branches. There is also intra and extrahepatic biliary ductal dilation. Further evaluation with MRI of the abdomen, pancreatic protocol, when clinically feasible, may be considered. 2. 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 3. Aortic atherosclerosis. Aortic Atherosclerosis (ICD10-I70.0). These results were called by telephone at the time of interpretation on 07/18/2021 at 4:03 pm to provider Carmin Muskrat , who verbally acknowledged these results. Electronically Signed   By: Fidela Salisbury M.D.   On: 07/18/2021 16:03    Procedures Procedures   Medications Ordered in ED Medications  allopurinol (ZYLOPRIM) tablet 300 mg (has no administration in time range)  aspirin EC tablet 81 mg (has no administration in time range)  bisoprolol-hydrochlorothiazide (ZIAC) 2.5-6.25 MG per tablet 1 tablet (has no administration in time range)  pravastatin (PRAVACHOL) tablet 20 mg (has no administration in time range)  levothyroxine (SYNTHROID) tablet 112 mcg (has no administration in time  range)  pantoprazole (PROTONIX) EC tablet 40 mg (has no administration in time range)  sodium chloride flush (NS) 0.9 % injection 3 mL (has no administration in time range)  acetaminophen (TYLENOL) tablet 650 mg (has no administration in time range)    Or  acetaminophen (TYLENOL) suppository 650 mg (has no administration in time range)  0.9 %  sodium chloride infusion (has no administration in time range)  polyethylene glycol (MIRALAX / GLYCOLAX) packet 17 g (has no administration in time range)  iohexol (OMNIPAQUE) 300 MG/ML solution 80 mL (80 mLs Intravenous Contrast Given 07/18/21 1448)    ED Course  I have reviewed the triage vital signs and the nursing notes.  Pertinent labs & imaging results that were available during my care of the patient were reviewed by me and considered in my medical decision making (see chart for details).  Cardiac 50s sinus bradycardia abnormal Pulse ox 99% room air normal Update: I discussed the patient's case with the radiologist.  I reviewed the images myself, patient found to have intra-abdominal mass concern for pancreatic cancer with ductal dilation.  Discussed the patient's case with our gastroenterology colleague, Dr. Benson Norway.  GI will follow as a consulting team with anticipated stenting, further evaluation of malignancy.  Update: Patient in no distress, he, his son are aware of all findings, need for admission.  I also discussed his case with our internal medicine colleagues.  MDM Rules/Calculators/A&P Adult male presents with jaundice, early satiety, clinical concern for malignancy consistent with findings on today's evaluation with  hyperbilirubinemia, pancreatic mass all consistent/concerning for pancreatic cancer.  After consultation with multiple colleagues patient admitted for further monitoring, management. Final Clinical Impression(s) / ED Diagnoses Final diagnoses:  Pancreatic mass  Hyperbilirubinemia  Jaundice  MDM Number of Diagnoses or  Management Options Hyperbilirubinemia: new, needed workup Jaundice: new, needed workup Pancreatic mass: new, needed workup   Amount and/or Complexity of Data Reviewed Clinical lab tests: reviewed and ordered Tests in the radiology section of CPT: ordered and reviewed Tests in the medicine section of CPT: ordered and reviewed Discussion of test results with the performing providers: yes Decide to obtain previous medical records or to obtain history from someone other than the patient: yes Obtain history from someone other than the patient: yes Review and summarize past medical records: yes Discuss the patient with other providers: yes Independent visualization of images, tracings, or specimens: yes  Risk of Complications, Morbidity, and/or Mortality Presenting problems: high Diagnostic procedures: high Management options: high  Critical Care Total time providing critical care: 30-74 minutes (45)  Patient Progress Patient progress: stable     Carmin Muskrat, MD 07/18/21 2009

## 2021-07-18 NOTE — ED Triage Notes (Signed)
C/o epigastric pain x 3 weeks with SOB.  Pt jaundice- he didn't realize he was.  Family noticed it around Thanksgiving.  No ETOH.

## 2021-07-18 NOTE — ED Notes (Addendum)
EDP into see, pt updated, pending GI consult, and admitting.

## 2021-07-18 NOTE — H&P (Addendum)
History and Physical   [name] TIW:[id number] DOB: 1940-12-19 DOA: 07/18/2021  PCP: Ronnell Freshwater, NP   Patient coming from: Home  Chief Complaint: Epigastric pain  HPI: Angel Keller. is a 80 y.o. male with medical history significant of thyroid disease, anxiety, depression, diverticulosis, hypertension, GERD, hyperlipidemia, aneurysm presenting with ongoing epigastric pain.  Patient has had 3 weeks of epigastric pain.  Son also helps provide history.  He saw Dr. On 18 November was started on a PPI.  This did not seem to improve his symptoms.  He had some skin changes since around Thanksgiving but has been getting worse and has early satiety as well now.  He also reports some nausea in addition to epigastric pain.  Patient has fevers, chills, chest pain, shortness of breath, constipation, diarrhea, vomiting.   ED Course: Vital signs in the ED significant for blood pressure in the 250N 397 systolic, heart rate in the 40s to 50s, temperature 97.5.  Lab work-up showed CMP with sodium 133, creatinine 1.27 from baseline around 1, glucose 156, AST and ALT chronically elevated at 92 and 134 respectively.  Alk phos now significantly abated at 385 and T bili 15.9.  Lipase elevated at 69.  CBC with hemoglobin stable at 12.3.  Troponin normal.  LDH normal.  Rest were panel flu and COVID pending.  Ethanol level and Tylenol level normal.  Hepatitis panel negative.  Urinalysis showed hemoglobin, bilirubin, protein.  Haptoglobin level pending.  Chest x-ray showed linear basilar opacities left greater than right suspicious for atelectasis versus chronic sciatic changes versus atypical pneumonia.  CT of the abdomen pelvis shows no suspicious solid mass in the body of the pancreas 2 x 2 x 2 cm with pancreatic ductal dilation and biliary ductal dilation.  Recommending MRI for further follow-up.  GI consulted in the ED will see the patient we will plan to do a stent for relief of this obstructive  symptoms and likely the above-mentioned MRI.   Review of Systems: As per HPI otherwise all other systems reviewed and are negative.  Past Medical History:  Diagnosis Date   Aneurysm ([place])    Anxiety    Arthritis    Depression    Headache    HLD (hyperlipidemia)    Hx of inguinal hernia repair    Hypertension    Hypothyroidism    Overactive bladder    Pneumonia    Shingles    Thyroid disease     Past Surgical History:  Procedure Laterality Date   BILATERAL CARPAL TUNNEL RELEASE     CATARACT EXTRACTION, BILATERAL Bilateral    ELBOW SURGERY Bilateral    INGUINAL HERNIA REPAIR Right 1951   IR ANGIO INTRA EXTRACRAN SEL COM CAROTID INNOMINATE BILAT MOD SED  02/20/2019   IR GENERIC HISTORICAL  10/28/2016   IR RADIOLOGIST EVAL & MGMT 10/28/2016 [facility]   RADIOLOGY WITH ANESTHESIA N/A 10/21/2015   Procedure: RADIOLOGY WITH ANESTHESIA;  Surgeon: Luanne Bras, MD;  Location: [facility];  Service: Radiology;  Laterality: N/A;   RADIOLOGY WITH ANESTHESIA N/A 02/20/2019   Procedure: [employer];  Surgeon: Luanne Bras, MD;  Location: [facility];  Service: Radiology;  Laterality: N/A;   ROTATOR CUFF REPAIR Bilateral    VARICOSE VEIN SURGERY      Social History  reports that he quit smoking about 52 years ago. He has a 0.25 pack-year smoking history. He has never used smokeless tobacco. He reports that he does not drink alcohol and does  not use drugs.  No Known Allergies  Family History  Problem Relation Age of Onset   Breast cancer Mother    Lung cancer Father    Alcoholism Sister    Cirrhosis Sister    Colon cancer Neg Hx    Esophageal cancer Neg Hx    Liver cancer Neg Hx    Pancreatic cancer Neg Hx    Prostate cancer Neg Hx   Reviewed on admission  Prior to Admission medications   Medication Sig Start Date End Date Taking? Authorizing Provider  allopurinol (ZYLOPRIM) 300 MG tablet Take 300 mg by mouth daily. 02/04/20   [provider]  amoxicillin (AMOXIL) 875 MG  tablet Take 1 tablet (875 mg total) by mouth 2 (two) times daily. 07/02/21   Ronnell Freshwater, NP  apixaban (ELIQUIS) 5 MG TABS tablet Take eliquis 10 mg (2 tablets) twice a day for 1 week.  Then on 08/07/2020, taper eliquis to 5 mg (1 tab) twice a day for 3 months or until advised to stop by PCP 07/30/20   Rai, Vernelle Emerald, MD  ascorbic acid (VITAMIN C) 500 MG tablet Take 1 tablet (500 mg total) by mouth daily. 07/31/20   Rai, Vernelle Emerald, MD  aspirin EC 81 MG tablet Take 1 tablet (81 mg total) by mouth daily. Hold while on blood thinner/eliquis for 3 months, then resume 07/30/20   Rai, Ripudeep K, MD  benzonatate (TESSALON) 100 MG capsule Take 100 mg by mouth 3 (three) times daily. 07/20/20   [provider]  bisoprolol-hydrochlorothiazide (ZIAC) 2.5-6.25 MG tablet Take 1 tablet by mouth daily. 06/20/15   [provider]  Cholecalciferol (D3 ADULT PO) Take 2,000 Units by mouth daily.    [provider]  Cyanocobalamin (B-12 PO) Take 1,000 mcg by mouth daily.    [provider]  escitalopram (LEXAPRO) 20 MG tablet Take 20 mg by mouth daily. 12/22/18   [provider]  guaiFENesin-dextromethorphan (ROBITUSSIN DM) 100-10 MG/5ML syrup Take 10 mLs by mouth every 4 (four) hours as needed for cough. 07/30/20   Rai, Vernelle Emerald, MD  lovastatin (MEVACOR) 20 MG tablet Take 20 mg by mouth daily. 07/25/15   [provider]  pantoprazole (PROTONIX) 40 MG tablet Take 1 tablet (40 mg total) by mouth daily. 07/12/21   Ronnell Freshwater, NP  predniSONE (DELTASONE) 10 MG tablet Prednisone dosing: Take  Prednisone 80m (4 tabs) x 3 days, then taper to 35m(3 tabs) x 3 days, then 2032m2 tabs) x 3days, then 35m65m tab) x 3days, then OFF. 07/30/20   Rai, Ripudeep K, MD  SYNTHROID 112 MCG tablet Take 112 mcg by mouth daily. 07/02/20   [provider]  zinc sulfate 220 (50 Zn) MG capsule Take 1 capsule (220 mg total) by mouth daily. 07/31/20   Rai,Mendel Corning     Physical Exam: Vitals:   07/18/21 1845 07/18/21 1900 07/18/21 1927 07/18/21 1945  BP: (!) 193/95 (!) 193/97  (!) 155/72  Pulse: (!) 55 (!) 52  (!) 57  Resp: 17 19  (!) 24  Temp:      TempSrc:      SpO2: 97% 97% 97% 99%   Physical Exam Constitutional:      General: He is not in acute distress.    Appearance: Normal appearance.  HENT:     Head: Normocephalic and atraumatic.     Mouth/Throat:     Mouth: Mucous membranes are moist.     Pharynx:  Oropharynx is clear.  Eyes:     General: Scleral icterus present.     Extraocular Movements: Extraocular movements intact.     Pupils: Pupils are equal, round, and reactive to light.  Cardiovascular:     Rate and Rhythm: Regular rhythm. Bradycardia present.     Pulses: Normal pulses.     Heart sounds: Normal heart sounds.  Pulmonary:     Effort: Pulmonary effort is normal. No respiratory distress.     Breath sounds: Normal breath sounds.  Abdominal:     General: Bowel sounds are normal. There is no distension.     Palpations: Abdomen is soft.     Tenderness: There is abdominal tenderness.  Musculoskeletal:        General: No swelling or deformity.  Skin:    General: Skin is warm and dry.     Coloration: Skin is jaundiced.  Neurological:     General: No focal deficit present.     Mental Status: Mental status is at baseline.   Labs on Admission: I have personally reviewed following labs and imaging studies  CBC: Recent Labs  Lab 07/18/21 1225  WBC 8.6  NEUTROABS 5.9  HGB 12.3*  HCT 37.3*  MCV 81.6  PLT 778    Basic Metabolic Panel: Recent Labs  Lab 07/18/21 1225  NA 133*  K 4.0  CL 100  CO2 25  GLUCOSE 156*  BUN 14  CREATININE 1.27*  CALCIUM 9.2    GFR: CrCl cannot be calculated (Unknown ideal weight.).  Liver Function Tests: Recent Labs  Lab 07/18/21 1225  AST 92*  ALT 134*  ALKPHOS 385*  BILITOT 15.9*  PROT 7.0  ALBUMIN 3.2*    Urine analysis:    Component Value Date/Time   COLORURINE  AMBER (A) 07/18/2021 Dewy Rose 07/18/2021 1223   LABSPEC 1.026 07/18/2021 1223   PHURINE 5.0 07/18/2021 1223   GLUCOSEU NEGATIVE 07/18/2021 1223   GLUCOSEU NEGATIVE 11/07/2008 0000   HGBUR SMALL (A) 07/18/2021 1223   BILIRUBINUR MODERATE (A) 07/18/2021 1223   KETONESUR NEGATIVE 07/18/2021 1223   PROTEINUR 100 (A) 07/18/2021 1223   UROBILINOGEN 1.0 07/09/2009 2019   NITRITE NEGATIVE 07/18/2021 1223   LEUKOCYTESUR NEGATIVE 07/18/2021 1223    Radiological Exams on Admission: DG Chest 2 View  Result Date: 07/18/2021 CLINICAL DATA:  Epigastric pain and nausea. EXAM: CHEST - 2 VIEW COMPARISON:  July 27, 2020 FINDINGS: Tortuosity of the aorta. Cardiomediastinal silhouette is normal. Mediastinal contours appear intact. Linear peribronchial opacities in bilateral lung bases, left greater than right. Osseous structures are without acute abnormality. Soft tissues are grossly normal. IMPRESSION: Linear peribronchial opacities in bilateral lung bases, left greater than right. This may represent atelectasis, bronchitic changes or atypical pneumonia. Electronically Signed   By: Fidela Salisbury M.D.   On: 07/18/2021 13:50   CT Abdomen Pelvis W Contrast  Result Date: 07/18/2021 CLINICAL DATA:  Epigastric pain. EXAM: CT ABDOMEN AND PELVIS WITH CONTRAST TECHNIQUE: Multidetector CT imaging of the abdomen and pelvis was performed using the standard protocol following bolus administration of intravenous contrast. 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 Hepatobiliary: Intrahepatic and extrahepatic biliary ductal dilation. Otherwise normal attenuation of the liver. The gallbladder is normally distended. The extrahepatic common bile duct measures 1.4 cm. Pancreas: Suspected hypoattenuated mass within the proximal body of the pancreas measures 2.3 x 2.6 x 2.0 cm. There is  a  downstream dilation of the main pancreatic duct and its branches. It is likely the cause for the biliary ductal dilation as well as it compresses the ampulla. Spleen: Normal in size without focal abnormality. Adrenals/Urinary Tract: Adrenal glands are unremarkable. Kidneys are normal, without renal calculi, focal lesion, or hydronephrosis. Bladder is unremarkable. Stomach/Bowel: Stomach is within normal limits. No evidence of bowel wall thickening, distention, or inflammatory changes. Vascular/Lymphatic: Aortic atherosclerosis. No enlarged abdominal or pelvic lymph nodes. Reproductive: Prostate is unremarkable. Other: No abdominal wall hernia or abnormality. No abdominopelvic ascites. Musculoskeletal: Spondylosis of the thoracolumbar spine. IMPRESSION: 1. Suspected solid mass within the proximal body of the pancreas measures 2.3 x 2.6 x 2.0 cm. There is downstream dilation of the main pancreatic duct and its branches. There is also intra and extrahepatic biliary ductal dilation. Further evaluation with MRI of the abdomen, pancreatic protocol, when clinically feasible, may be considered. 2. 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 3. Aortic atherosclerosis. Aortic Atherosclerosis (ICD10-I70.0). These results were called by telephone at the time of interpretation on 07/18/2021 at 4:03 pm to provider Carmin Muskrat , who verbally acknowledged these results. Electronically Signed   By: Fidela Salisbury M.D.   On: 07/18/2021 16:03    EKG: Independently reviewed.  Sinus bradycardia at 49 bpm.  Similar to previous.  Assessment/Plan Principal Problem:   Obstructive jaundice Active Problems:   Acquired hypothyroidism   HLD (hyperlipidemia)   Anxiety   Depression   Essential hypertension   Pancreatic mass  Obstructive jaundice Pancreatic mass > Patient presented with epigastric pain with newly developing jaundice and early satiety. > CT in the ED showed evidence of pancreatic mass  with pancreatic and biliary ductal dilation. > GI consulted in the ED plan for stenting to relieve obstruction and likely MRI. - During admission patient will benefit from getting follow-up arranged with oncology. - Admit to telemetry - Appreciate GI recommendations - Trend LFTs - CA 19-9   Hypothyroidism - Continue home Synthroid  Hypertension - Continue home bisoprolol-hydrochlorothiazide  Hyperlipidemia - Continue home statin  Anxiety Depression - Continue home Lexapro  DVT prophylaxis: SCDs Code Status:   Full Family Communication: Son updated at bedside Disposition Plan:   Patient is from:  Home  Anticipated DC to:  Home  Anticipated DC date:  1 to 5 days  Anticipated DC barriers: None  Consults called:  GI, consulted by EDP.   Admission status:  Observation, telemetry   Severity of Illness: The appropriate patient status for this patient is OBSERVATION. Observation status is judged to be reasonable and necessary in order to provide the required intensity of service to ensure the patient's safety. The patient's presenting symptoms, physical exam findings, and initial radiographic and laboratory data in the context of their medical condition is felt to place them at decreased risk for further clinical deterioration. Furthermore, it is anticipated that the patient will be medically stable for discharge from the hospital within 2 midnights of admission.    Marcelyn Bruins MD [facility]  How to contact the [facility] Attending or Consulting provider [place] or covering provider during after hours [place], for this patient?   Check the care team in [facility] and look for a) attending/consulting [facility] provider listed and b) the [facility] team listed Log into [email or web address] and use [place]'s universal password to access. If you do not have the password, please contact the hospital operator. Locate the [facility] provider you are looking for under [facility] and  page to a number that  you can be directly reached. If you still have difficulty reaching the provider, please page the [facility] (Director on Call) for the Hospitalists listed on amion for assistance.  07/18/2021, 8:52 PM

## 2021-07-18 NOTE — ED Notes (Signed)
Pt ambulated to restroom with steady gait.

## 2021-07-19 ENCOUNTER — Encounter (HOSPITAL_COMMUNITY): Payer: Self-pay | Admitting: Internal Medicine

## 2021-07-19 DIAGNOSIS — F32A Depression, unspecified: Secondary | ICD-10-CM | POA: Diagnosis present

## 2021-07-19 DIAGNOSIS — K760 Fatty (change of) liver, not elsewhere classified: Secondary | ICD-10-CM | POA: Diagnosis present

## 2021-07-19 DIAGNOSIS — D689 Coagulation defect, unspecified: Secondary | ICD-10-CM | POA: Diagnosis present

## 2021-07-19 DIAGNOSIS — F419 Anxiety disorder, unspecified: Secondary | ICD-10-CM | POA: Diagnosis present

## 2021-07-19 DIAGNOSIS — I10 Essential (primary) hypertension: Secondary | ICD-10-CM | POA: Diagnosis present

## 2021-07-19 DIAGNOSIS — Z87891 Personal history of nicotine dependence: Secondary | ICD-10-CM | POA: Diagnosis not present

## 2021-07-19 DIAGNOSIS — I2510 Atherosclerotic heart disease of native coronary artery without angina pectoris: Secondary | ICD-10-CM | POA: Diagnosis present

## 2021-07-19 DIAGNOSIS — Z8616 Personal history of COVID-19: Secondary | ICD-10-CM | POA: Diagnosis not present

## 2021-07-19 DIAGNOSIS — E785 Hyperlipidemia, unspecified: Secondary | ICD-10-CM | POA: Diagnosis present

## 2021-07-19 DIAGNOSIS — Z800 Family history of malignant neoplasm of digestive organs: Secondary | ICD-10-CM | POA: Diagnosis not present

## 2021-07-19 DIAGNOSIS — R634 Abnormal weight loss: Secondary | ICD-10-CM

## 2021-07-19 DIAGNOSIS — K831 Obstruction of bile duct: Secondary | ICD-10-CM | POA: Diagnosis present

## 2021-07-19 DIAGNOSIS — R1013 Epigastric pain: Secondary | ICD-10-CM | POA: Diagnosis present

## 2021-07-19 DIAGNOSIS — K8689 Other specified diseases of pancreas: Secondary | ICD-10-CM | POA: Diagnosis not present

## 2021-07-19 DIAGNOSIS — G8929 Other chronic pain: Secondary | ICD-10-CM | POA: Diagnosis present

## 2021-07-19 DIAGNOSIS — E871 Hypo-osmolality and hyponatremia: Secondary | ICD-10-CM | POA: Diagnosis present

## 2021-07-19 DIAGNOSIS — E876 Hypokalemia: Secondary | ICD-10-CM | POA: Diagnosis present

## 2021-07-19 DIAGNOSIS — R718 Other abnormality of red blood cells: Secondary | ICD-10-CM | POA: Diagnosis present

## 2021-07-19 DIAGNOSIS — D490 Neoplasm of unspecified behavior of digestive system: Secondary | ICD-10-CM | POA: Diagnosis present

## 2021-07-19 DIAGNOSIS — K219 Gastro-esophageal reflux disease without esophagitis: Secondary | ICD-10-CM | POA: Diagnosis present

## 2021-07-19 DIAGNOSIS — N3281 Overactive bladder: Secondary | ICD-10-CM | POA: Diagnosis present

## 2021-07-19 DIAGNOSIS — Z8701 Personal history of pneumonia (recurrent): Secondary | ICD-10-CM | POA: Diagnosis not present

## 2021-07-19 DIAGNOSIS — I700 Atherosclerosis of aorta: Secondary | ICD-10-CM | POA: Diagnosis present

## 2021-07-19 DIAGNOSIS — E039 Hypothyroidism, unspecified: Secondary | ICD-10-CM | POA: Diagnosis present

## 2021-07-19 DIAGNOSIS — Z20822 Contact with and (suspected) exposure to covid-19: Secondary | ICD-10-CM | POA: Diagnosis present

## 2021-07-19 DIAGNOSIS — Z539 Procedure and treatment not carried out, unspecified reason: Secondary | ICD-10-CM | POA: Diagnosis not present

## 2021-07-19 DIAGNOSIS — M549 Dorsalgia, unspecified: Secondary | ICD-10-CM | POA: Diagnosis present

## 2021-07-19 LAB — COMPREHENSIVE METABOLIC PANEL
ALT: 98 U/L — ABNORMAL HIGH (ref 0–44)
AST: 65 U/L — ABNORMAL HIGH (ref 15–41)
Albumin: 2.5 g/dL — ABNORMAL LOW (ref 3.5–5.0)
Alkaline Phosphatase: 315 U/L — ABNORMAL HIGH (ref 38–126)
Anion gap: 8 (ref 5–15)
BUN: 10 mg/dL (ref 8–23)
CO2: 24 mmol/L (ref 22–32)
Calcium: 8.2 mg/dL — ABNORMAL LOW (ref 8.9–10.3)
Chloride: 102 mmol/L (ref 98–111)
Creatinine, Ser: 0.97 mg/dL (ref 0.61–1.24)
GFR, Estimated: >60 mL/min (ref >60)
Glucose, Bld: 105 mg/dL — ABNORMAL HIGH (ref 70–99)
Potassium: 3.3 mmol/L — ABNORMAL LOW (ref 3.5–5.1)
Sodium: 134 mmol/L — ABNORMAL LOW (ref 135–145)
Total Bilirubin: 12.9 mg/dL — ABNORMAL HIGH (ref 0.3–1.2)
Total Protein: 5.8 g/dL — ABNORMAL LOW (ref 6.5–8.1)

## 2021-07-19 LAB — CBC
HCT: 33.4 % — ABNORMAL LOW (ref 39.0–52.0)
Hemoglobin: 11 g/dL — ABNORMAL LOW (ref 13.0–17.0)
MCH: 27 pg (ref 26.0–34.0)
MCHC: 32.9 g/dL (ref 30.0–36.0)
MCV: 82.1 fL (ref 80.0–100.0)
Platelets: 172 10*3/uL (ref 150–400)
RBC: 4.07 MIL/uL — ABNORMAL LOW (ref 4.22–5.81)
RDW: 20.3 % — ABNORMAL HIGH (ref 11.5–15.5)
WBC: 5.6 10*3/uL (ref 4.0–10.5)
nRBC: 0 % (ref 0.0–0.2)

## 2021-07-19 LAB — PROTIME-INR
INR: 2 — ABNORMAL HIGH (ref 0.8–1.2)
Prothrombin Time: 22.8 seconds — ABNORMAL HIGH (ref 11.4–15.2)

## 2021-07-19 LAB — HAPTOGLOBIN: Haptoglobin: 152 mg/dL (ref 38–329)

## 2021-07-19 MED ORDER — PHYTONADIONE 5 MG PO TABS
5.0000 mg | ORAL_TABLET | Freq: Once | ORAL | Status: AC
  Administered 2021-07-19: 5 mg via ORAL
  Filled 2021-07-19: qty 1

## 2021-07-19 MED ORDER — HYDRALAZINE HCL 20 MG/ML IJ SOLN: 10.0000 mg | Freq: Once | INTRAMUSCULAR | Status: AC

## 2021-07-19 MED ORDER — HYDRALAZINE HCL 20 MG/ML IJ SOLN
INTRAMUSCULAR | Status: AC
  Administered 2021-07-19: 10 mg via INTRAVENOUS
  Filled 2021-07-19: qty 1

## 2021-07-19 MED ORDER — POTASSIUM CHLORIDE CRYS ER 20 MEQ PO TBCR
20.0000 meq | EXTENDED_RELEASE_TABLET | Freq: Once | ORAL | Status: AC
  Administered 2021-07-19: 20 meq via ORAL
  Filled 2021-07-19: qty 1

## 2021-07-19 NOTE — Consult Note (Addendum)
[facility] Consult: 8:13 AM 07/19/2021  LOS: 0 days    Referring Provider: Dr Starla Link  Primary Care Physician:  Ronnell Freshwater, NP Primary Gastroenterologist:  Dr. Delfin Edis >> [place] MD.      Reason for Consultation:  Obstructive jaundice, pancreatic mass.     HPI: Angel Keller. is a 80 y.o. male.  PMH htn.  Hld.  Hypothyroidism.   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  Covid 19 PNA, admission for 5 d in 07/2020.   2009 Colonoscopy.  Fup adenomatous polyps 2004.  Int hemorrhoids.  Diverticulosis.  No polyps.  Hypodensity in right hepatic lobe per CT of 07/2018, follow-up CTAP 12/2018 showed fatty liver and stable hypodensities in the left and right hepatic lobes, statistically likely to represent small cysts.  Aortic atherosclerosis, no aneurysm.  For about 2 weeks experiencing what he felt was heartburn.  This was refractory to his usual daily omeprazole.  PCP initiated trial pantoprazole 40 mg daily 11/28, this also gave no relief.  Did not take any pain meds.  Nausea but no emesis.  Anorexia.  Dark-colored urine.  Not sure if he has lost weight.  Fatigue.  Normally goes to the gym 3 x weekly and does weight work but not much cardio, but lately does not have the energy to do this.  Gets fatigued and somewhat short of breath with activities around the house.  No cough.  Family members noticed jaundice around Thanksgiving, 10 days ago. Tachycardic in the 40s to 33s.  Normotensive.  No fever.  No tachypnea.  No hypoxia. T bili 12.9.  Alk phos 315.  AST/ALT 65/98.  Renal function, glucose WNL.  Sodium 134.  Potassium 3.3.  Hgb 11.  Acute hepatitis serologies negative/nonreactive.  ETOH <10.   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  Associated dilation of main PD  and branches as well as intra and extrahepatic biliary ductal system.  Heavy burden of atherosclerotic CAD and aortic atherosclerosis.  Retired from working in a [employer].  Quit smoking cigarettes and drinking alcohol in the 1960s.  Widowed since 2007.  Lives with his unemployed 79 something y/O daughter who sounds like she drinks alcohol excessively.  Has another daughter as well. Family history of pancreatic cancer in his father.  Cirrhosis, alcoholism in sister.     Past Medical History:  Diagnosis Date   Aneurysm ([place])    Anxiety    Arthritis    Depression    Headache    HLD (hyperlipidemia)    Hx of inguinal hernia repair    Hypertension    Hypothyroidism    Overactive bladder    Pneumonia    Shingles    Thyroid disease     Past Surgical History:  Procedure Laterality Date   BILATERAL CARPAL TUNNEL RELEASE     CATARACT EXTRACTION, BILATERAL Bilateral    ELBOW SURGERY Bilateral    INGUINAL HERNIA REPAIR Right 1951   IR ANGIO INTRA EXTRACRAN SEL COM CAROTID INNOMINATE BILAT  MOD SED  02/20/2019   IR GENERIC HISTORICAL  10/28/2016   IR RADIOLOGIST EVAL & MGMT 10/28/2016 [facility]-INTERV RAD   RADIOLOGY WITH ANESTHESIA N/A 10/21/2015   Procedure: RADIOLOGY WITH ANESTHESIA;  Surgeon: Luanne Bras, MD;  Location: [place];  Service: Radiology;  Laterality: N/A;   RADIOLOGY WITH ANESTHESIA N/A 02/20/2019   Procedure: [employer];  Surgeon: Luanne Bras, MD;  Location: [place];  Service: Radiology;  Laterality: N/A;   ROTATOR CUFF REPAIR Bilateral    VARICOSE VEIN SURGERY      Prior to Admission medications   Medication Sig Start Date End Date Taking? Authorizing Provider  aspirin EC 81 MG tablet Take 1 tablet (81 mg total) by mouth daily. Hold while on blood thinner/eliquis for 3 months, then resume Patient taking differently: Take 81 mg by mouth daily. 07/30/20  Yes Rai, Ripudeep K, MD  bisoprolol-hydrochlorothiazide (ZIAC) 2.5-6.25 MG tablet Take 1 tablet by mouth every  morning. 06/20/15  Yes [provider]  Cholecalciferol (VITAMIN D3) 50 MCG (2000 UT) TABS Take 2,000 Units by mouth daily.   Yes [provider]  escitalopram (LEXAPRO) 20 MG tablet Take 20 mg by mouth daily. 12/22/18  Yes [provider]  lovastatin (MEVACOR) 20 MG tablet Take 20 mg by mouth at bedtime. 07/25/15  Yes [provider]  pantoprazole (PROTONIX) 40 MG tablet Take 1 tablet (40 mg total) by mouth daily. 07/12/21  Yes Ronnell Freshwater, NP  SYNTHROID 112 MCG tablet Take 112 mcg by mouth daily before breakfast. 07/02/20  Yes [provider]  vitamin B-12 (CYANOCOBALAMIN) 1000 MCG tablet Take 2,000 mcg by mouth daily.   Yes [provider]  allopurinol (ZYLOPRIM) 300 MG tablet Take 300 mg by mouth daily. Patient not taking: Reported on 07/18/2021 02/04/20   [provider]  amoxicillin (AMOXIL) 875 MG tablet Take 1 tablet (875 mg total) by mouth 2 (two) times daily. Patient not taking: Reported on 07/18/2021 07/02/21   Ronnell Freshwater, NP    Scheduled Meds:  bisoprolol-hydrochlorothiazide  1 tablet Oral Daily   escitalopram  20 mg Oral Daily   famotidine  20 mg Oral Daily   levothyroxine  112 mcg Oral Daily   pantoprazole  40 mg Oral Daily   sodium chloride flush  3 mL Intravenous Q12H   Infusions:  PRN Meds: acetaminophen **OR** acetaminophen, ondansetron (ZOFRAN) IV, polyethylene glycol   Allergies as of 07/18/2021   (No Known Allergies)    Family History  Problem Relation Age of Onset   Breast cancer Mother    Lung cancer Father    Alcoholism Sister    Cirrhosis Sister    Colon cancer Neg Hx    Esophageal cancer Neg Hx    Liver cancer Neg Hx    Pancreatic cancer Neg Hx    Prostate cancer Neg Hx     Social History   Socioeconomic History   Marital status: Widowed    Spouse name: Not on file   Number of children: 5   Years of education: Not on file   Highest education level: Not on file   Occupational History   Occupation: retired  Tobacco Use   Smoking status: Former    Packs/day: 0.25    Years: 1.00    Pack years: 0.25    Types: Cigarettes    Quit date: 08/15/1968    Years since quitting: 52.9   Smokeless tobacco: Never  Vaping Use   Vaping Use: Never used  Substance and Sexual  Activity   Alcohol use: No   Drug use: No   Sexual activity: Not Currently  Other Topics Concern   Not on file  Social History Narrative   Not on file   Social Determinants of Health   Financial Resource Strain: Not on file  Food Insecurity: Not on file  Transportation Needs: Not on file  Physical Activity: Not on file  Stress: Not on file  Social Connections: Not on file  Intimate Partner Violence: Not on file    REVIEW OF SYSTEMS: Constitutional: Fatigue as per HPI. ENT:  No nose bleeds Pulm: Mild shortness of breath but more just fatigue with activity.  No productive cough. CV:  No palpitations, no LE edema.  No angina GU: Dark-colored urine.  No dysuria.  No hematuria, no frequency GI: See HPI. Heme: No unusual bleeding or bruising. Transfusions: None Neuro:  No headaches, no peripheral tingling or numbness.  No syncope, no seizures. Derm:  No pruritus. no rash or sores.  Endocrine:  No sweats or chills.  No polyuria or dysuria Immunization: Received 1 COVID-19 vaccination prior to having contracted the disease a year ago, no subsequent immunizations. Travel: Not queried.   PHYSICAL EXAM: Vital signs in last 24 hours: Vitals:   07/19/21 0645 07/19/21 0700  BP: (!) 150/81 (!) 178/77  Pulse: (!) 48 (!) 48  Resp: (!) 22 18  Temp:    SpO2: 96% 97%   Wt Readings from Last 3 Encounters:  07/02/21 77.6 kg  07/27/20 74.8 kg  02/20/19 73.9 kg    General: Patient is jaundiced but otherwise looks well and is resting quietly in the ER Head: No facial asymmetry or swelling.  No signs of head trauma. Eyes: Scleral icterus Ears: Mild hearing loss. Nose: No congestion  or discharge Mouth: Oral mucosa is moist, pink, clear.  Tongue is midline.  Full denture above, partial denture below. Neck: No JVD, no masses, no thyromegaly Lungs: Clear bilaterally without labored breathing or cough. Heart: RRR.  No MRG.  S1, S2 present. Abdomen: Soft, nondistended.  Abdominal musculature firm.  No tenderness.  No masses, organomegaly, hernias appreciated..   Rectal: Deferred Musc/Skeltl: No muscular atrophy.  No gross joint deformities other than some arthritic changes in the fingers. Extremities: No CCE. Neurologic: Alert.  Oriented x3.  Moves all 4 limbs.  No tremors or gross weakness. Skin: Jaundiced.  No telangiectasia, no rash, no sores. Tattoos: Multiple tattoos on both arms, professional quality. Nodes: No cervical or inguinal adenopathy Psych: Pleasant, cooperative, fluid speech.  Intake/Output from previous day: 12/04 0701 - 12/05 0700 In: 1000 [I.V.:1000] Out: -  Intake/Output this shift: No intake/output data recorded.  LAB RESULTS: Recent Labs    07/18/21 1225 07/19/21 0431  WBC 8.6 5.6  HGB 12.3* 11.0*  HCT 37.3* 33.4*  PLT 214 172   BMET Lab Results  Component Value Date   NA 134 (L) 07/19/2021   NA 133 (L) 07/18/2021   NA 137 07/31/2020   K 3.3 (L) 07/19/2021   K 4.0 07/18/2021   K 4.6 07/31/2020   CL 102 07/19/2021   CL 100 07/18/2021   CL 101 07/31/2020   CO2 24 07/19/2021   CO2 25 07/18/2021   CO2 26 07/31/2020   GLUCOSE 105 (H) 07/19/2021   GLUCOSE 156 (H) 07/18/2021   GLUCOSE 140 (H) 07/31/2020   BUN 10 07/19/2021   BUN 14 07/18/2021   BUN 27 (H) 07/31/2020   CREATININE 0.97 07/19/2021   CREATININE 1.27 (H)  07/18/2021   CREATININE 1.02 07/31/2020   CALCIUM 8.2 (L) 07/19/2021   CALCIUM 9.2 07/18/2021   CALCIUM 8.9 07/31/2020   LFT Recent Labs    07/18/21 1225 07/19/21 0431  PROT 7.0 5.8*  ALBUMIN 3.2* 2.5*  AST 92* 65*  ALT 134* 98*  ALKPHOS 385* 315*  BILITOT 15.9* 12.9*   PT/INR Lab Results  Component  Value Date   INR 1.1 02/20/2019   INR 1.11 10/14/2015   Hepatitis Panel Recent Labs    07/18/21 1229  HEPBSAG NON REACTIVE  HCVAB NON REACTIVE  HEPAIGM NON REACTIVE  HEPBIGM NON REACTIVE   C-Diff No components found for: CDIFF Lipase     Component Value Date/Time   LIPASE 69 (H) 07/18/2021 1225    Drugs of Abuse  No results found for: LABOPIA, COCAINSCRNUR, LABBENZ, AMPHETMU, THCU, LABBARB   RADIOLOGY STUDIES: DG Chest 2 View  Result Date: 07/18/2021 CLINICAL DATA:  Epigastric pain and nausea. EXAM: CHEST - 2 VIEW COMPARISON:  July 27, 2020 FINDINGS: Tortuosity of the aorta. Cardiomediastinal silhouette is normal. Mediastinal contours appear intact. Linear peribronchial opacities in bilateral lung bases, left greater than right. Osseous structures are without acute abnormality. Soft tissues are grossly normal. IMPRESSION: Linear peribronchial opacities in bilateral lung bases, left greater than right. This may represent atelectasis, bronchitic changes or atypical pneumonia. Electronically Signed   By: Fidela Salisbury M.D.   On: 07/18/2021 13:50   CT Abdomen Pelvis W Contrast  Result Date: 07/18/2021 CLINICAL DATA:  Epigastric pain. EXAM: CT ABDOMEN AND PELVIS WITH CONTRAST TECHNIQUE: Multidetector CT imaging of the abdomen and pelvis was performed using the standard protocol following bolus administration of intravenous contrast. CONTRAST:  81m OMNIPAQUE IOHEXOL 300 MG/ML  SOLN COMPARISON:  Jan 09, 2019 FINDINGS: Lower chest: Heavy calcific atherosclerotic disease of the coronary arteries. Hepatobiliary: Intrahepatic and extrahepatic biliary ductal dilation. Otherwise normal attenuation of the liver. The gallbladder is normally distended. The extrahepatic common bile duct measures 1.4 cm. Pancreas: Suspected hypoattenuated mass within the proximal body of the pancreas measures 2.3 x 2.6 x 2.0 cm. There is a downstream dilation of the main pancreatic duct and its branches. It  is likely the cause for the biliary ductal dilation as well as it compresses the ampulla. Spleen: Normal in size without focal abnormality. Adrenals/Urinary Tract: Adrenal glands are unremarkable. Kidneys are normal, without renal calculi, focal lesion, or hydronephrosis. Bladder is unremarkable. Stomach/Bowel: Stomach is within normal limits. No evidence of bowel wall thickening, distention, or inflammatory changes. Vascular/Lymphatic: Aortic atherosclerosis. No enlarged abdominal or pelvic lymph nodes. Reproductive: Prostate is unremarkable. Other: No abdominal wall hernia or abnormality. No abdominopelvic ascites. Musculoskeletal: Spondylosis of the thoracolumbar spine. IMPRESSION: 1. Suspected solid mass within the proximal body of the pancreas measures 2.3 x 2.6 x 2.0 cm. There is downstream dilation of the main pancreatic duct and its branches. There is also intra and extrahepatic biliary ductal dilation. Further evaluation with MRI of the abdomen, pancreatic protocol, when clinically feasible, may be considered. 2. 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 3. Aortic atherosclerosis. Aortic Atherosclerosis (ICD10-I70.0). These results were called by telephone at the time of interpretation on 07/18/2021 at 4:03 pm to provider RCarmin Muskrat, who verbally acknowledged these results. Electronically Signed   By: DFidela SalisburyM.D.   On: 07/18/2021 16:03     IMPRESSION:     Pancreatic mass w obst jaundice.  Sxs started w epigastric discomfort. Heartburn refractory to PPI.  CA 19-9 and INR are  pndg.      Hyponatremia, non-critical.    Hypokalemia.      PLAN:       ERCP w attempt to stent and brush cytology, hopefully tmrw.  Allow po in take til midnight today.      Please do not order Lovenox, SQ Heparin for now.  None in place currently.  Ok for PAS stockings.      Ordered 20 meq Potassium, recheck levels in AM.     Azucena Freed  07/19/2021, 8:13 AM Phone [phone]  [phone]  I have reviewed the entire case in detail with the above APP and discussed the plan in detail.  Therefore, I agree with the diagnoses recorded above. In addition,  I have personally interviewed and examined the patient and have personally reviewed any abdominal/pelvic CT scan images.  My additional thoughts are as follows:  This patient's daughter was at the bedside throughout the entire visit. He is jaundiced and otherwise not acutely ill-appearing.  Lately has had a burning epigastric discomfort, decreased oral intake due to the abdominal pain and some weight loss of unspecified amount.  His daughter noticed that he seemed to be losing weight.  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 The obstruction is also causing mild coagulopathy with INR of 2.0 today.  CA 19-9 reportedly pending.  I discussed the overall situation with Mr. Nakanishi and his daughter.  He is aware of the plan for an ERCP tomorrow with my partner Dr. Silvano Rusk.  The goal will be for biliary access, obtain brushings in place a plastic biliary stent.  5 mg of oral vitamin K ordered for today, repeat INR tomorrow morning along with LFTs.  ERCP described in detail along with risks and benefits and he was agreeable.  The benefits and risks of the planned procedure were described in detail with the patient or (when appropriate) their health care proxy.  Risks were outlined as including, but not limited to, bleeding, infection, perforation, adverse medication reaction leading to cardiac or pulmonary decompensation, pancreatitis (if ERCP).  The limitation of incomplete mucosal visualization was also discussed.  No guarantees or warranties were given.  He is aware that if biliary cannulation is unsuccessful, percutaneous biliary access with drain will be needed.  Patient may need outpatient EUS in near future if brushings nondiagnostic and for staging.   Nelida Meuse  III [phone]

## 2021-07-19 NOTE — Progress Notes (Signed)
   07/19/21 2151  Assess: MEWS Score  Temp 98 F (36.7 C)  BP (!) 181/75  Pulse Rate (!) 57  ECG Heart Rate (!) 56  Resp 20  Level of Consciousness Alert  SpO2 98 %  Assess: MEWS Score  MEWS Temp 0  MEWS Systolic 0  MEWS Pulse 0  MEWS RR 0  MEWS LOC 0  MEWS Score 0  MEWS Score Color Green    Second set of Yellows MEWS vitals late due to patient in bathroom. VS due at 2144

## 2021-07-19 NOTE — Progress Notes (Signed)
Patient oriented to the unit. Skin assessment performed with CN Denali Sharma B. Patient hooked up to wall monitor. Patient c/o of being cold and extra blankets provided.

## 2021-07-19 NOTE — Progress Notes (Signed)
   07/19/21 1944  Assess: MEWS Score  Temp 98.3 F (36.8 C)  BP (!) 202/88  Pulse Rate (!) 59  ECG Heart Rate (!) 59  Resp 19  Level of Consciousness Alert  SpO2 98 %  Assess: MEWS Score  MEWS Temp 0  MEWS Systolic 2  MEWS Pulse 0  MEWS RR 0  MEWS LOC 0  MEWS Score 2  MEWS Score Color Yellow  Assess: if the MEWS score is Yellow or Red  Were vital signs taken at a resting state? Yes  Focused Assessment No change from prior assessment  Early Detection of Sepsis Score *See Row Information* Low  MEWS guidelines implemented *See Row Information* Yes  Treat  MEWS Interventions Escalated (See documentation below)  Pain Scale 0-10  Pain Score 0  Take Vital Signs  Increase Vital Sign Frequency  Yellow: Q 2hr X 2 then Q 4hr X 2, if remains yellow, continue Q 4hrs  Escalate  MEWS: Escalate Yellow: discuss with charge nurse/RN and consider discussing with provider and RRT  Notify: Charge Nurse/RN  Name of Charge Nurse/RN Notified Elmyra Ricks RN  Date Charge Nurse/RN Notified 07/19/21  Time Charge Nurse/RN Notified 2030  Notify: Provider  Provider Name/Title Dr. Tonie Griffith  Date Provider Notified 07/19/21  Time Provider Notified 2000  Notification Type Page  Notification Reason Other (Comment) (sBP >200)  Provider response See new orders  Date of Provider Response 07/19/21  Time of Provider Response 2005  Document  Patient Outcome Stabilized after interventions    MEWS Yellow. In part due to low HR, which pt runs bradycardiac. As well as elevated BP. Called provider to make aware of BP and HR. Charge RN made aware of MEWS. Hydralazine 10mg  IV given once to patient. Will continue to follows MEWS protocol.

## 2021-07-19 NOTE — ED Notes (Addendum)
Na

## 2021-07-19 NOTE — H&P (View-Only) (Signed)
[facility] Consult: 8:13 AM 07/19/2021  LOS: 0 days    Referring Provider: Dr Starla Link  Primary Care Physician:  Ronnell Freshwater, NP Primary Gastroenterologist:  Dr. Delfin Edis >> [place] MD.      Reason for Consultation:  Obstructive jaundice, pancreatic mass.     HPI: Angel Keller. is a 80 y.o. male.  PMH htn.  Hld.  Hypothyroidism.   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  Covid 19 PNA, admission for 5 d in 07/2020.   2009 Colonoscopy.  Fup adenomatous polyps 2004.  Int hemorrhoids.  Diverticulosis.  No polyps.  Hypodensity in right hepatic lobe per CT of 07/2018, follow-up CTAP 12/2018 showed fatty liver and stable hypodensities in the left and right hepatic lobes, statistically likely to represent small cysts.  Aortic atherosclerosis, no aneurysm.  For about 2 weeks experiencing what he felt was heartburn.  This was refractory to his usual daily omeprazole.  PCP initiated trial pantoprazole 40 mg daily 11/28, this also gave no relief.  Did not take any pain meds.  Nausea but no emesis.  Anorexia.  Dark-colored urine.  Not sure if he has lost weight.  Fatigue.  Normally goes to the gym 3 x weekly and does weight work but not much cardio, but lately does not have the energy to do this.  Gets fatigued and somewhat short of breath with activities around the house.  No cough.  Family members noticed jaundice around Thanksgiving, 10 days ago. Tachycardic in the 40s to 33s.  Normotensive.  No fever.  No tachypnea.  No hypoxia. T bili 12.9.  Alk phos 315.  AST/ALT 65/98.  Renal function, glucose WNL.  Sodium 134.  Potassium 3.3.  Hgb 11.  Acute hepatitis serologies negative/nonreactive.  ETOH <10.   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  Associated dilation of main PD  and branches as well as intra and extrahepatic biliary ductal system.  Heavy burden of atherosclerotic CAD and aortic atherosclerosis.  Retired from working in a [employer].  Quit smoking cigarettes and drinking alcohol in the 1960s.  Widowed since 2007.  Lives with his unemployed 79 something y/O daughter who sounds like she drinks alcohol excessively.  Has another daughter as well. Family history of pancreatic cancer in his father.  Cirrhosis, alcoholism in sister.     Past Medical History:  Diagnosis Date   Aneurysm ([place])    Anxiety    Arthritis    Depression    Headache    HLD (hyperlipidemia)    Hx of inguinal hernia repair    Hypertension    Hypothyroidism    Overactive bladder    Pneumonia    Shingles    Thyroid disease     Past Surgical History:  Procedure Laterality Date   BILATERAL CARPAL TUNNEL RELEASE     CATARACT EXTRACTION, BILATERAL Bilateral    ELBOW SURGERY Bilateral    INGUINAL HERNIA REPAIR Right 1951   IR ANGIO INTRA EXTRACRAN SEL COM CAROTID INNOMINATE BILAT  MOD SED  02/20/2019   IR GENERIC HISTORICAL  10/28/2016   IR RADIOLOGIST EVAL & MGMT 10/28/2016 [facility]INTER[facility] RAD   RADIOLOGY WITH ANESTHESIA N/A 10/21/2015   Procedure: RADIOLOGY WITH ANESTHESIA;  Surgeon: Luanne Bras, MD;  Location: [place];  Service: Radiology;  Laterality: N/A;   RADIOLOGY WITH ANESTHESIA N/A 02/20/2019   Procedure: [employer];  Surgeon: Luanne Bras, MD;  Location: [place];  Service: Radiology;  Laterality: N/A;   ROTATOR CUFF REPAIR Bilateral    VARICOSE VEIN SURGERY      Prior to Admission medications   Medication Sig Start Date End Date Taking? Authorizing Provider  aspirin EC 81 MG tablet Take 1 tablet (81 mg total) by mouth daily. Hold while on blood thinner/eliquis for 3 months, then resume Patient taking differently: Take 81 mg by mouth daily. 07/30/20  Yes Rai, Ripudeep K, MD  bisoprolol-hydrochlorothiazide (ZIAC) 2.5-6.25 MG tablet Take 1 tablet by mouth every  morning. 06/20/15  Yes [provider]  Cholecalciferol (VITAMIN D3) 50 MCG (2000 UT) TABS Take 2,000 Units by mouth daily.   Yes [provider]  escitalopram (LEXAPRO) 20 MG tablet Take 20 mg by mouth daily. 12/22/18  Yes [provider]  lovastatin (MEVACOR) 20 MG tablet Take 20 mg by mouth at bedtime. 07/25/15  Yes [provider]  pantoprazole (PROTONIX) 40 MG tablet Take 1 tablet (40 mg total) by mouth daily. 07/12/21  Yes Ronnell Freshwater, NP  SYNTHROID 112 MCG tablet Take 112 mcg by mouth daily before breakfast. 07/02/20  Yes [provider]  vitamin B-12 (CYANOCOBALAMIN) 1000 MCG tablet Take 2,000 mcg by mouth daily.   Yes [provider]  allopurinol (ZYLOPRIM) 300 MG tablet Take 300 mg by mouth daily. Patient not taking: Reported on 07/18/2021 02/04/20   [provider]  amoxicillin (AMOXIL) 875 MG tablet Take 1 tablet (875 mg total) by mouth 2 (two) times daily. Patient not taking: Reported on 07/18/2021 07/02/21   Ronnell Freshwater, NP    Scheduled Meds:  bisoprolol-hydrochlorothiazide  1 tablet Oral Daily   escitalopram  20 mg Oral Daily   famotidine  20 mg Oral Daily   levothyroxine  112 mcg Oral Daily   pantoprazole  40 mg Oral Daily   sodium chloride flush  3 mL Intravenous Q12H   Infusions:  PRN Meds: acetaminophen **OR** acetaminophen, ondansetron (ZOFRAN) IV, polyethylene glycol   Allergies as of 07/18/2021   (No Known Allergies)    Family History  Problem Relation Age of Onset   Breast cancer Mother    Lung cancer Father    Alcoholism Sister    Cirrhosis Sister    Colon cancer Neg Hx    Esophageal cancer Neg Hx    Liver cancer Neg Hx    Pancreatic cancer Neg Hx    Prostate cancer Neg Hx     Social History   Socioeconomic History   Marital status: Widowed    Spouse name: Not on file   Number of children: 5   Years of education: Not on file   Highest education level: Not on file   Occupational History   Occupation: retired  Tobacco Use   Smoking status: Former    Packs/day: 0.25    Years: 1.00    Pack years: 0.25    Types: Cigarettes    Quit date: 08/15/1968    Years since quitting: 52.9   Smokeless tobacco: Never  Vaping Use   Vaping Use: Never used  Substance and Sexual  Activity   Alcohol use: No   Drug use: No   Sexual activity: Not Currently  Other Topics Concern   Not on file  Social History Narrative   Not on file   Social Determinants of Health   Financial Resource Strain: Not on file  Food Insecurity: Not on file  Transportation Needs: Not on file  Physical Activity: Not on file  Stress: Not on file  Social Connections: Not on file  Intimate Partner Violence: Not on file    REVIEW OF SYSTEMS: Constitutional: Fatigue as per HPI. ENT:  No nose bleeds Pulm: Mild shortness of breath but more just fatigue with activity.  No productive cough. CV:  No palpitations, no LE edema.  No angina GU: Dark-colored urine.  No dysuria.  No hematuria, no frequency GI: See HPI. Heme: No unusual bleeding or bruising. Transfusions: None Neuro:  No headaches, no peripheral tingling or numbness.  No syncope, no seizures. Derm:  No pruritus. no rash or sores.  Endocrine:  No sweats or chills.  No polyuria or dysuria Immunization: Received 1 COVID-19 vaccination prior to having contracted the disease a year ago, no subsequent immunizations. Travel: Not queried.   PHYSICAL EXAM: Vital signs in last 24 hours: Vitals:   07/19/21 0645 07/19/21 0700  BP: (!) 150/81 (!) 178/77  Pulse: (!) 48 (!) 48  Resp: (!) 22 18  Temp:    SpO2: 96% 97%   Wt Readings from Last 3 Encounters:  07/02/21 77.6 kg  07/27/20 74.8 kg  02/20/19 73.9 kg    General: Patient is jaundiced but otherwise looks well and is resting quietly in the ER Head: No facial asymmetry or swelling.  No signs of head trauma. Eyes: Scleral icterus Ears: Mild hearing loss. Nose: No congestion  or discharge Mouth: Oral mucosa is moist, pink, clear.  Tongue is midline.  Full denture above, partial denture below. Neck: No JVD, no masses, no thyromegaly Lungs: Clear bilaterally without labored breathing or cough. Heart: RRR.  No MRG.  S1, S2 present. Abdomen: Soft, nondistended.  Abdominal musculature firm.  No tenderness.  No masses, organomegaly, hernias appreciated..   Rectal: Deferred Musc/Skeltl: No muscular atrophy.  No gross joint deformities other than some arthritic changes in the fingers. Extremities: No CCE. Neurologic: Alert.  Oriented x3.  Moves all 4 limbs.  No tremors or gross weakness. Skin: Jaundiced.  No telangiectasia, no rash, no sores. Tattoos: Multiple tattoos on both arms, professional quality. Nodes: No cervical or inguinal adenopathy Psych: Pleasant, cooperative, fluid speech.  Intake/Output from previous day: 12/04 0701 - 12/05 0700 In: 1000 [I.V.:1000] Out: -  Intake/Output this shift: No intake/output data recorded.  LAB RESULTS: Recent Labs    07/18/21 1225 07/19/21 0431  WBC 8.6 5.6  HGB 12.3* 11.0*  HCT 37.3* 33.4*  PLT 214 172   BMET Lab Results  Component Value Date   NA 134 (L) 07/19/2021   NA 133 (L) 07/18/2021   NA 137 07/31/2020   K 3.3 (L) 07/19/2021   K 4.0 07/18/2021   K 4.6 07/31/2020   CL 102 07/19/2021   CL 100 07/18/2021   CL 101 07/31/2020   CO2 24 07/19/2021   CO2 25 07/18/2021   CO2 26 07/31/2020   GLUCOSE 105 (H) 07/19/2021   GLUCOSE 156 (H) 07/18/2021   GLUCOSE 140 (H) 07/31/2020   BUN 10 07/19/2021   BUN 14 07/18/2021   BUN 27 (H) 07/31/2020   CREATININE 0.97 07/19/2021   CREATININE 1.27 (H)  07/18/2021   CREATININE 1.02 07/31/2020   CALCIUM 8.2 (L) 07/19/2021   CALCIUM 9.2 07/18/2021   CALCIUM 8.9 07/31/2020   LFT Recent Labs    07/18/21 1225 07/19/21 0431  PROT 7.0 5.8*  ALBUMIN 3.2* 2.5*  AST 92* 65*  ALT 134* 98*  ALKPHOS 385* 315*  BILITOT 15.9* 12.9*   PT/INR Lab Results  Component  Value Date   INR 1.1 02/20/2019   INR 1.11 10/14/2015   Hepatitis Panel Recent Labs    07/18/21 1229  HEPBSAG NON REACTIVE  HCVAB NON REACTIVE  HEPAIGM NON REACTIVE  HEPBIGM NON REACTIVE   C-Diff No components found for: CDIFF Lipase     Component Value Date/Time   LIPASE 69 (H) 07/18/2021 1225    Drugs of Abuse  No results found for: LABOPIA, COCAINSCRNUR, LABBENZ, AMPHETMU, THCU, LABBARB   RADIOLOGY STUDIES: DG Chest 2 View  Result Date: 07/18/2021 CLINICAL DATA:  Epigastric pain and nausea. EXAM: CHEST - 2 VIEW COMPARISON:  July 27, 2020 FINDINGS: Tortuosity of the aorta. Cardiomediastinal silhouette is normal. Mediastinal contours appear intact. Linear peribronchial opacities in bilateral lung bases, left greater than right. Osseous structures are without acute abnormality. Soft tissues are grossly normal. IMPRESSION: Linear peribronchial opacities in bilateral lung bases, left greater than right. This may represent atelectasis, bronchitic changes or atypical pneumonia. Electronically Signed   By: Fidela Salisbury M.D.   On: 07/18/2021 13:50   CT Abdomen Pelvis W Contrast  Result Date: 07/18/2021 CLINICAL DATA:  Epigastric pain. EXAM: CT ABDOMEN AND PELVIS WITH CONTRAST TECHNIQUE: Multidetector CT imaging of the abdomen and pelvis was performed using the standard protocol following bolus administration of intravenous contrast. CONTRAST:  81m OMNIPAQUE IOHEXOL 300 MG/ML  SOLN COMPARISON:  Jan 09, 2019 FINDINGS: Lower chest: Heavy calcific atherosclerotic disease of the coronary arteries. Hepatobiliary: Intrahepatic and extrahepatic biliary ductal dilation. Otherwise normal attenuation of the liver. The gallbladder is normally distended. The extrahepatic common bile duct measures 1.4 cm. Pancreas: Suspected hypoattenuated mass within the proximal body of the pancreas measures 2.3 x 2.6 x 2.0 cm. There is a downstream dilation of the main pancreatic duct and its branches. It  is likely the cause for the biliary ductal dilation as well as it compresses the ampulla. Spleen: Normal in size without focal abnormality. Adrenals/Urinary Tract: Adrenal glands are unremarkable. Kidneys are normal, without renal calculi, focal lesion, or hydronephrosis. Bladder is unremarkable. Stomach/Bowel: Stomach is within normal limits. No evidence of bowel wall thickening, distention, or inflammatory changes. Vascular/Lymphatic: Aortic atherosclerosis. No enlarged abdominal or pelvic lymph nodes. Reproductive: Prostate is unremarkable. Other: No abdominal wall hernia or abnormality. No abdominopelvic ascites. Musculoskeletal: Spondylosis of the thoracolumbar spine. IMPRESSION: 1. Suspected solid mass within the proximal body of the pancreas measures 2.3 x 2.6 x 2.0 cm. There is downstream dilation of the main pancreatic duct and its branches. There is also intra and extrahepatic biliary ductal dilation. Further evaluation with MRI of the abdomen, pancreatic protocol, when clinically feasible, may be considered. 2. 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 3. Aortic atherosclerosis. Aortic Atherosclerosis (ICD10-I70.0). These results were called by telephone at the time of interpretation on 07/18/2021 at 4:03 pm to provider RCarmin Muskrat, who verbally acknowledged these results. Electronically Signed   By: DFidela SalisburyM.D.   On: 07/18/2021 16:03     IMPRESSION:     Pancreatic mass w obst jaundice.  Sxs started w epigastric discomfort. Heartburn refractory to PPI.  CA 19-9 and INR are  pndg.      Hyponatremia, non-critical.    Hypokalemia.      PLAN:       ERCP w attempt to stent and brush cytology, hopefully tmrw.  Allow po in take til midnight today.      Please do not order Lovenox, SQ Heparin for now.  None in place currently.  Ok for PAS stockings.      Ordered 20 meq Potassium, recheck levels in AM.     Azucena Freed  07/19/2021, 8:13 AM Phone [phone]  [phone]  I have reviewed the entire case in detail with the above APP and discussed the plan in detail.  Therefore, I agree with the diagnoses recorded above. In addition,  I have personally interviewed and examined the patient and have personally reviewed any abdominal/pelvic CT scan images.  My additional thoughts are as follows:  This patient's daughter was at the bedside throughout the entire visit. He is jaundiced and otherwise not acutely ill-appearing.  Lately has had a burning epigastric discomfort, decreased oral intake due to the abdominal pain and some weight loss of unspecified amount.  His daughter noticed that he seemed to be losing weight.  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 The obstruction is also causing mild coagulopathy with INR of 2.0 today.  CA 19-9 reportedly pending.  I discussed the overall situation with Angel Keller and his daughter.  He is aware of the plan for an ERCP tomorrow with my partner Dr. Silvano Rusk.  The goal will be for biliary access, obtain brushings in place a plastic biliary stent.  5 mg of oral vitamin K ordered for today, repeat INR tomorrow morning along with LFTs.  ERCP described in detail along with risks and benefits and he was agreeable.  The benefits and risks of the planned procedure were described in detail with the patient or (when appropriate) their health care proxy.  Risks were outlined as including, but not limited to, bleeding, infection, perforation, adverse medication reaction leading to cardiac or pulmonary decompensation, pancreatitis (if ERCP).  The limitation of incomplete mucosal visualization was also discussed.  No guarantees or warranties were given.  He is aware that if biliary cannulation is unsuccessful, percutaneous biliary access with drain will be needed.  Patient may need outpatient EUS in near future if brushings nondiagnostic and for staging.   Nelida Meuse  III [phone]

## 2021-07-19 NOTE — Progress Notes (Signed)
Patient ID: Angel Baze., male   DOB: 08-28-1940, 80 y.o.   MRN: [id number]  PROGRESS NOTE    Angel Keller  [id number]:[id number] DOB: 09/16/1940 DOA: 07/18/2021 PCP: Ronnell Freshwater, NP   Brief Narrative:  80 y.o. male with medical history significant of thyroid disease, anxiety, depression, diverticulosis, hypertension, GERD, hyperlipidemia presented with worsening abdominal pain.  On presentation, AST was 92 and ALT was 134; alkaline phosphatase was 385, total bili of 15.9, lipase of 69.  CT of the abdomen and pelvis showed suspicious mass in the body of pancreas 2 x 2 x 2 cm with pancreatic ductal dilatation and biliary ductal dilatation; recommending MRI for further follow-up.  GI was consulted.  Assessment & Plan:   Obstructive jaundice Pancreatic mass Elevated LFTs -Presented with bilirubin of 15.9; bilirubin 12.9 this morning.  LFTs mildly improving. -GI evaluation pending: Might need stenting and likely MRI of the abdomen. -Trend LFTs  Hypothyroidism -Continue Synthroid  Hypertension -Blood pressure on the higher side.  Continue bisoprolol/hydrochlorothiazide  Anxiety/depression -Continue Lexapro  Hyperlipidemia -Hold pravastatin because of increased LFTs  DVT prophylaxis: SCDs Code Status: Full Family Communication: None at bedside Disposition Plan: Status is: Observation  The patient will require care spanning > 2 midnights and should be moved to inpatient because: Of elevated LFTs.  Need for possible biliary stenting/MRI of abdomen  Consultants: GI  Procedures: None  Antimicrobials: None   Subjective: Patient seen and examined at bedside.  Denies any overnight fever, nausea, vomiting.  Still complains of intermittent abdominal pain.  Objective: Vitals:   07/19/21 0615 07/19/21 0630 07/19/21 0645 07/19/21 0700  BP: (!) 163/78 (!) 160/75 (!) 150/81 (!) 178/77  Pulse: (!) 48 (!) 47 (!) 48 (!) 48  Resp: 18 17 (!) 22 18  Temp:      TempSrc:      SpO2:  97% 97% 96% 97%    Intake/Output Summary (Last 24 hours) at 07/19/2021 0741 Last data filed at 07/19/2021 0453 Gross per 24 hour  Intake 1000 ml  Output --  Net 1000 ml   There were no vitals filed for this visit.  Examination:  General exam: Appears calm and comfortable.  Currently on room air.  Mildly tender in the epigastric region. Respiratory system: Bilateral decreased breath sounds at bases Cardiovascular system: S1 & S2 heard, mildly bradycardic intermittently Gastrointestinal system: Abdomen is nondistended, soft and nontender. Normal bowel sounds heard. Extremities: No cyanosis, clubbing, edema  Central nervous system: Alert and oriented. No focal neurological deficits. Moving extremities Skin: No rashes, lesions or ulcers Psychiatry: Judgement and insight appear normal. Mood & affect appropriate.     Data Reviewed: I have personally reviewed following labs and imaging studies  CBC: Recent Labs  Lab 07/18/21 1225 07/19/21 0431  WBC 8.6 5.6  NEUTROABS 5.9  --   HGB 12.3* 11.0*  HCT 37.3* 33.4*  MCV 81.6 82.1  PLT 214 413   Basic Metabolic Panel: Recent Labs  Lab 07/18/21 1225 07/19/21 0431  NA 133* 134*  K 4.0 3.3*  CL 100 102  CO2 25 24  GLUCOSE 156* 105*  BUN 14 10  CREATININE 1.27* 0.97  CALCIUM 9.2 8.2*   GFR: CrCl cannot be calculated (Unknown ideal weight.). Liver Function Tests: Recent Labs  Lab 07/18/21 1225 07/19/21 0431  AST 92* 65*  ALT 134* 98*  ALKPHOS 385* 315*  BILITOT 15.9* 12.9*  PROT 7.0 5.8*  ALBUMIN 3.2* 2.5*   Recent Labs  Lab 07/18/21 1225  LIPASE 69*   No results for input(s): AMMONIA in the last 168 hours. Coagulation Profile: No results for input(s): INR, PROTIME in the last 168 hours. Cardiac Enzymes: No results for input(s): CKTOTAL, CKMB, CKMBINDEX, TROPONINI in the last 168 hours. BNP (last 3 results) No results for input(s): PROBNP in the last 8760 hours. HbA1C: No results for input(s): HGBA1C in the  last 72 hours. CBG: No results for input(s): GLUCAP in the last 168 hours. Lipid Profile: No results for input(s): CHOL, HDL, LDLCALC, TRIG, CHOLHDL, LDLDIRECT in the last 72 hours. Thyroid Function Tests: No results for input(s): TSH, T4TOTAL, FREET4, T3FREE, THYROIDAB in the last 72 hours. Anemia Panel: No results for input(s): VITAMINB12, FOLATE, FERRITIN, TIBC, IRON, RETICCTPCT in the last 72 hours. Sepsis Labs: No results for input(s): PROCALCITON, LATICACIDVEN in the last 168 hours.  Recent Results (from the past 240 hour(s))  Resp Panel by RT-PCR (Flu A&B, Covid) Nasopharyngeal Swab     Status: None   Collection Time: 07/18/21  4:13 PM   Specimen: Nasopharyngeal Swab; Nasopharyngeal(NP) swabs in vial transport medium  Result Value Ref Range Status   SARS Coronavirus 2 by RT PCR NEGATIVE NEGATIVE Final    Comment: (NOTE) SARS-CoV-2 target nucleic acids are NOT DETECTED.  The SARS-CoV-2 RNA is generally detectable in upper respiratory specimens during the acute phase of infection. The lowest concentration of SARS-CoV-2 viral copies this assay can detect is 138 copies/mL. A negative result does not preclude SARS-Cov-2 infection and should not be used as the sole basis for treatment or other patient management decisions. A negative result may occur with  improper specimen collection/handling, submission of specimen other than nasopharyngeal swab, presence of viral mutation(s) within the areas targeted by this assay, and inadequate number of viral copies(<138 copies/mL). A negative result must be combined with clinical observations, patient history, and epidemiological information. The expected result is Negative.  Fact Sheet for Patients:  EntrepreneurPulse.com.au  Fact Sheet for Healthcare Providers:  IncredibleEmployment.be  This test is no t yet approved or cleared by the [place] FDA and  has been authorized for detection and/or  diagnosis of SARS-CoV-2 by FDA under an Emergency Use Authorization (EUA). This EUA will remain  in effect (meaning this test can be used) for the duration of the COVID-19 declaration under Section 564(b)(1) of the Act, 21 U.S.C.section 360bbb-3(b)(1), unless the authorization is terminated  or revoked sooner.       Influenza A by PCR NEGATIVE NEGATIVE Final   Influenza B by PCR NEGATIVE NEGATIVE Final    Comment: (NOTE) The Xpert Xpress SARS-CoV-2/FLU/RSV plus assay is intended as an aid in the diagnosis of influenza from Nasopharyngeal swab specimens and should not be used as a sole basis for treatment. Nasal washings and aspirates are unacceptable for Xpert Xpress SARS-CoV-2/FLU/RSV testing.  Fact Sheet for Patients: EntrepreneurPulse.com.au  Fact Sheet for Healthcare Providers: IncredibleEmployment.be  This test is not yet approved or cleared by the [place] FDA and has been authorized for detection and/or diagnosis of SARS-CoV-2 by FDA under an Emergency Use Authorization (EUA). This EUA will remain in effect (meaning this test can be used) for the duration of the COVID-19 declaration under Section 564(b)(1) of the Act, 21 U.S.C. section 360bbb-3(b)(1), unless the authorization is terminated or revoked.  Performed at [facility], [place] [address], [place], [place] [address]          Radiology Studies: DG Chest 2 View  Result Date: 07/18/2021 CLINICAL DATA:  Epigastric pain  and nausea. EXAM: CHEST - 2 VIEW COMPARISON:  July 27, 2020 FINDINGS: Tortuosity of the aorta. Cardiomediastinal silhouette is normal. Mediastinal contours appear intact. Linear peribronchial opacities in bilateral lung bases, left greater than right. Osseous structures are without acute abnormality. Soft tissues are grossly normal. IMPRESSION: Linear peribronchial opacities in bilateral lung bases, left greater than right. This may represent  atelectasis, bronchitic changes or atypical pneumonia. Electronically Signed   By: Fidela Salisbury M.D.   On: 07/18/2021 13:50   CT Abdomen Pelvis W Contrast  Result Date: 07/18/2021 CLINICAL DATA:  Epigastric pain. EXAM: CT ABDOMEN AND PELVIS WITH CONTRAST TECHNIQUE: Multidetector CT imaging of the abdomen and pelvis was performed using the standard protocol following bolus administration of intravenous contrast. 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 Hepatobiliary: Intrahepatic and extrahepatic biliary ductal dilation. Otherwise normal attenuation of the liver. The gallbladder is normally distended. The extrahepatic common bile duct measures 1.4 cm. Pancreas: Suspected hypoattenuated mass within the proximal body of the pancreas measures 2.3 x 2.6 x 2.0 cm. There is a downstream dilation of the main pancreatic duct and its branches. It is likely the cause for the biliary ductal dilation as well as it compresses the ampulla. Spleen: Normal in size without focal abnormality. Adrenals/Urinary Tract: Adrenal glands are unremarkable. Kidneys are normal, without renal calculi, focal lesion, or hydronephrosis. Bladder is unremarkable. Stomach/Bowel: Stomach is within normal limits. No evidence of bowel wall thickening, distention, or inflammatory changes. Vascular/Lymphatic: Aortic atherosclerosis. No enlarged abdominal or pelvic lymph nodes. Reproductive: Prostate is unremarkable. Other: No abdominal wall hernia or abnormality. No abdominopelvic ascites. Musculoskeletal: Spondylosis of the thoracolumbar spine. IMPRESSION: 1. Suspected solid mass within the proximal body of the pancreas measures 2.3 x 2.6 x 2.0 cm. There is downstream dilation of the main pancreatic duct and its branches. There is also intra and extrahepatic biliary ductal dilation. Further evaluation with MRI of the  abdomen, pancreatic protocol, when clinically feasible, may be considered. 2. 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 3. Aortic atherosclerosis. Aortic Atherosclerosis (ICD10-I70.0). These results were called by telephone at the time of interpretation on 07/18/2021 at 4:03 pm to provider Carmin Muskrat , who verbally acknowledged these results. Electronically Signed   By: Fidela Salisbury M.D.   On: 07/18/2021 16:03        Scheduled Meds:  aspirin EC  81 mg Oral Daily   bisoprolol-hydrochlorothiazide  1 tablet Oral Daily   escitalopram  20 mg Oral Daily   famotidine  20 mg Oral Daily   levothyroxine  112 mcg Oral Daily   pantoprazole  40 mg Oral Daily   pravastatin  20 mg Oral q1800   sodium chloride flush  3 mL Intravenous Q12H   Continuous Infusions:  sodium chloride 125 mL/hr at 07/19/21 0453          Aline August, MD [facility] 07/19/2021, 7:41 AM

## 2021-07-20 ENCOUNTER — Encounter (HOSPITAL_COMMUNITY)
Admission: EM | Disposition: A | Discharge status: Home / Self Care | Payer: Self-pay | Source: Home / Self Care | Attending: Internal Medicine

## 2021-07-20 ENCOUNTER — Inpatient Hospital Stay (HOSPITAL_COMMUNITY): Payer: Medicare HMO | Admitting: Certified Registered"

## 2021-07-20 ENCOUNTER — Inpatient Hospital Stay (HOSPITAL_COMMUNITY): Payer: Medicare HMO

## 2021-07-20 ENCOUNTER — Encounter (HOSPITAL_COMMUNITY): Payer: Self-pay | Admitting: Internal Medicine

## 2021-07-20 DIAGNOSIS — K831 Obstruction of bile duct: Secondary | ICD-10-CM | POA: Diagnosis not present

## 2021-07-20 DIAGNOSIS — I10 Essential (primary) hypertension: Secondary | ICD-10-CM | POA: Diagnosis not present

## 2021-07-20 DIAGNOSIS — K8689 Other specified diseases of pancreas: Secondary | ICD-10-CM | POA: Diagnosis not present

## 2021-07-20 HISTORY — PX: ENDOSCOPIC RETROGRADE CHOLANGIOPANCREATOGRAPHY (ERCP) WITH PROPOFOL: SHX5810

## 2021-07-20 LAB — CBC WITH DIFFERENTIAL/PLATELET
Abs Immature Granulocytes: 0.02 10*3/uL (ref 0.00–0.07)
Basophils Absolute: 0 10*3/uL (ref 0.0–0.1)
Basophils Relative: 0 %
Eosinophils Absolute: 0.3 10*3/uL (ref 0.0–0.5)
Eosinophils Relative: 4 %
HCT: 35.3 % — ABNORMAL LOW (ref 39.0–52.0)
Hemoglobin: 12.3 g/dL — ABNORMAL LOW (ref 13.0–17.0)
Immature Granulocytes: 0 %
Lymphocytes Relative: 15 %
Lymphs Abs: 1 10*3/uL (ref 0.7–4.0)
MCH: 27.8 pg (ref 26.0–34.0)
MCHC: 34.8 g/dL (ref 30.0–36.0)
MCV: 79.7 fL — ABNORMAL LOW (ref 80.0–100.0)
Monocytes Absolute: 0.8 10*3/uL (ref 0.1–1.0)
Monocytes Relative: 12 %
Neutro Abs: 4.6 10*3/uL (ref 1.7–7.7)
Neutrophils Relative %: 69 %
Platelets: 196 10*3/uL (ref 150–400)
RBC: 4.43 MIL/uL (ref 4.22–5.81)
RDW: 20.4 % — ABNORMAL HIGH (ref 11.5–15.5)
WBC: 6.7 10*3/uL (ref 4.0–10.5)
nRBC: 0 % (ref 0.0–0.2)

## 2021-07-20 LAB — MAGNESIUM: Magnesium: 2.1 mg/dL (ref 1.7–2.4)

## 2021-07-20 LAB — PROTIME-INR
INR: 2 — ABNORMAL HIGH (ref 0.8–1.2)
Prothrombin Time: 22.6 seconds — ABNORMAL HIGH (ref 11.4–15.2)

## 2021-07-20 LAB — COMPREHENSIVE METABOLIC PANEL
ALT: 100 U/L — ABNORMAL HIGH (ref 0–44)
AST: 72 U/L — ABNORMAL HIGH (ref 15–41)
Albumin: 2.6 g/dL — ABNORMAL LOW (ref 3.5–5.0)
Alkaline Phosphatase: 344 U/L — ABNORMAL HIGH (ref 38–126)
Anion gap: 8 (ref 5–15)
BUN: 9 mg/dL (ref 8–23)
CO2: 25 mmol/L (ref 22–32)
Calcium: 8.8 mg/dL — ABNORMAL LOW (ref 8.9–10.3)
Chloride: 100 mmol/L (ref 98–111)
Creatinine, Ser: 1.15 mg/dL (ref 0.61–1.24)
GFR, Estimated: >60 mL/min (ref >60)
Glucose, Bld: 147 mg/dL — ABNORMAL HIGH (ref 70–99)
Potassium: 3.8 mmol/L (ref 3.5–5.1)
Sodium: 133 mmol/L — ABNORMAL LOW (ref 135–145)
Total Bilirubin: 13.2 mg/dL — ABNORMAL HIGH (ref 0.3–1.2)
Total Protein: 6.2 g/dL — ABNORMAL LOW (ref 6.5–8.1)

## 2021-07-20 LAB — CANCER ANTIGEN 19-9: CA 19-9: 203 U/mL — ABNORMAL HIGH (ref 0–35)

## 2021-07-20 SURGERY — ENDOSCOPIC RETROGRADE CHOLANGIOPANCREATOGRAPHY (ERCP) WITH PROPOFOL
Anesthesia: General

## 2021-07-20 MED ORDER — ROCURONIUM BROMIDE 10 MG/ML (PF) SYRINGE
PREFILLED_SYRINGE | INTRAVENOUS | Status: DC | PRN
  Administered 2021-07-20: 60 mg via INTRAVENOUS

## 2021-07-20 MED ORDER — GLUCAGON HCL RDNA (DIAGNOSTIC) 1 MG IJ SOLR
INTRAMUSCULAR | Status: AC
  Filled 2021-07-20: qty 2

## 2021-07-20 MED ORDER — SUGAMMADEX SODIUM 200 MG/2ML IV SOLN
INTRAVENOUS | Status: DC | PRN
  Administered 2021-07-20: 200 mg via INTRAVENOUS

## 2021-07-20 MED ORDER — SODIUM CHLORIDE 0.9 % IV SOLN
1.0000 g | Freq: Once | INTRAVENOUS | Status: DC
  Filled 2021-07-20: qty 10

## 2021-07-20 MED ORDER — INDOMETHACIN 50 MG RE SUPP
RECTAL | Status: AC
  Filled 2021-07-20: qty 2

## 2021-07-20 MED ORDER — PHENYLEPHRINE HCL-NACL 20-0.9 MG/250ML-% IV SOLN
INTRAVENOUS | Status: DC | PRN
  Administered 2021-07-20: 25 ug/min via INTRAVENOUS

## 2021-07-20 MED ORDER — SODIUM CHLORIDE 0.9 % IV SOLN
INTRAVENOUS | Status: AC
  Filled 2021-07-20: qty 8

## 2021-07-20 MED ORDER — DEXAMETHASONE SODIUM PHOSPHATE 10 MG/ML IJ SOLN
INTRAMUSCULAR | Status: DC | PRN
  Administered 2021-07-20: 4 mg via INTRAVENOUS

## 2021-07-20 MED ORDER — ONDANSETRON HCL 4 MG/2ML IJ SOLN
INTRAMUSCULAR | Status: DC | PRN
  Administered 2021-07-20: 4 mg via INTRAVENOUS

## 2021-07-20 MED ORDER — HYDRALAZINE HCL 20 MG/ML IJ SOLN: 5.0000 mg | INTRAMUSCULAR | Status: DC | PRN

## 2021-07-20 MED ORDER — EPHEDRINE SULFATE-NACL 50-0.9 MG/10ML-% IV SOSY
PREFILLED_SYRINGE | INTRAVENOUS | Status: DC | PRN
  Administered 2021-07-20: 10 mg via INTRAVENOUS
  Administered 2021-07-20: 5 mg via INTRAVENOUS

## 2021-07-20 MED ORDER — INDOMETHACIN 50 MG RE SUPP: 100.0000 mg | Freq: Once | RECTAL | Status: DC

## 2021-07-20 MED ORDER — PROPOFOL 10 MG/ML IV BOLUS
INTRAVENOUS | Status: DC | PRN
  Administered 2021-07-20: 140 mg via INTRAVENOUS
  Administered 2021-07-20: 20 mg via INTRAVENOUS

## 2021-07-20 MED ORDER — SODIUM CHLORIDE 0.9 % IV SOLN
3.0000 g | Freq: Once | INTRAVENOUS | Status: AC
  Administered 2021-07-20: 3 g via INTRAVENOUS

## 2021-07-20 MED ORDER — LACTATED RINGERS IV SOLN: INTRAVENOUS | Status: DC

## 2021-07-20 NOTE — Interval H&P Note (Signed)
History and Physical Interval Note:  07/20/2021 1:40 PM  Angel Celeste Sr.  has presented today for surgery, with the diagnosis of Pancreatic mass.  Obstructive jaundice..  The various methods of treatment have been discussed with the patient and family. After consideration of risks, benefits and other options for treatment, the patient has consented to  Procedure(s): ENDOSCOPIC RETROGRADE CHOLANGIOPANCREATOGRAPHY (ERCP) WITH PROPOFOL (N/A) as a surgical intervention.  The patient's history has been reviewed, patient examined, no change in status, stable for surgery.  I have reviewed the patient's chart and labs.  Questions were answered to the patient's satisfaction.     Silvano Rusk

## 2021-07-20 NOTE — Progress Notes (Signed)
Patient ID: Angel Keller., male   DOB: [birth year], 79 y.o.   MRN: [id number]  PROGRESS NOTE    Marcin Holte  [id number]:[id number] DOB: May 20, 1941 DOA: 07/18/2021 PCP: Ronnell Freshwater, NP   Brief Narrative:  80 y.o. male with medical history significant of thyroid disease, anxiety, depression, diverticulosis, hypertension, GERD, hyperlipidemia presented with worsening abdominal pain.  On presentation, AST was 92 and ALT was 134; alkaline phosphatase was 385, total bili of 15.9, lipase of 69.  CT of the abdomen and pelvis showed suspicious mass in the body of pancreas 2 x 2 x 2 cm with pancreatic ductal dilatation and biliary ductal dilatation; recommending MRI for further follow-up.  GI was consulted.  Assessment & Plan:   Obstructive jaundice Pancreatic mass Elevated LFTs -Presented with bilirubin of 15.9; bilirubin 13.2 this morning.  AST/ALT and alkaline phosphatase also elevated -GI following and planning for possible ERCP today -Trend LFTs  Hypothyroidism -Continue Synthroid  Hypertension -Blood pressure on the higher side.  Continue bisoprolol/hydrochlorothiazide.  We will use hydralazine as needed  Anxiety/depression -Continue Lexapro  Hyperlipidemia -Hold pravastatin because of increased LFTs  DVT prophylaxis: SCDs Code Status: Full Family Communication: None at bedside Disposition Plan: Status is: inpatient because: Of elevated LFTs.  Need for possible ERCP today Consultants: GI  Procedures: None  Antimicrobials: None   Subjective: Patient seen and examined at bedside.  No worsening shortness of breath, vomiting, fever reported.  Still complains of upper abdominal pain intermittently. Objective: Vitals:   07/19/21 2344 07/20/21 0344 07/20/21 0347 07/20/21 0649  BP: (!) 167/78 (!) 152/89  (!) 194/83  Pulse: (!) 54 (!) 56  (!) 50  Resp: 18 20  18   Temp: 98.2 F (36.8 C) 98.1 F (36.7 C)    TempSrc: Axillary Axillary    SpO2: 96% 97%  97%  Weight:   80.4  kg     Intake/Output Summary (Last 24 hours) at 07/20/2021 0719 Last data filed at 07/20/2021 0422 Gross per 24 hour  Intake 480 ml  Output 1675 ml  Net -1195 ml    Filed Weights   07/20/21 0347  Weight: 80.4 kg    Examination:  General exam: On room air.  No distress.   Respiratory system: Decreased breath sounds at bases bilaterally cardiovascular system: Bradycardic; S1-S2 heard  gastrointestinal system: Abdomen is distended slightly, soft and mildly tender in the upper quadrant.  Bowel sounds are heard Extremities: No clubbing or lower extremity edema   Data Reviewed: I have personally reviewed following labs and imaging studies  CBC: Recent Labs  Lab 07/18/21 1225 07/19/21 0431 07/20/21 0049  WBC 8.6 5.6 6.7  NEUTROABS 5.9  --  4.6  HGB 12.3* 11.0* 12.3*  HCT 37.3* 33.4* 35.3*  MCV 81.6 82.1 79.7*  PLT 214 172 810    Basic Metabolic Panel: Recent Labs  Lab 07/18/21 1225 07/19/21 0431 07/20/21 0049  NA 133* 134* 133*  K 4.0 3.3* 3.8  CL 100 102 100  CO2 25 24 25   GLUCOSE 156* 105* 147*  BUN 14 10 9   CREATININE 1.27* 0.97 1.15  CALCIUM 9.2 8.2* 8.8*  MG  --   --  2.1    GFR: Estimated Creatinine Clearance: 52 mL/min (by C-G formula based on SCr of 1.15 mg/dL). Liver Function Tests: Recent Labs  Lab 07/18/21 1225 07/19/21 0431 07/20/21 0049  AST 92* 65* 72*  ALT 134* 98* 100*  ALKPHOS 385* 315* 344*  BILITOT 15.9* 12.9* 13.2*  PROT  7.0 5.8* 6.2*  ALBUMIN 3.2* 2.5* 2.6*    Recent Labs  Lab 07/18/21 1225  LIPASE 69*    No results for input(s): AMMONIA in the last 168 hours. Coagulation Profile: Recent Labs  Lab 07/19/21 1007 07/20/21 0049  INR 2.0* 2.0*   Cardiac Enzymes: No results for input(s): CKTOTAL, CKMB, CKMBINDEX, TROPONINI in the last 168 hours. BNP (last 3 results) No results for input(s): PROBNP in the last 8760 hours. HbA1C: No results for input(s): HGBA1C in the last 72 hours. CBG: No results for input(s): GLUCAP  in the last 168 hours. Lipid Profile: No results for input(s): CHOL, HDL, LDLCALC, TRIG, CHOLHDL, LDLDIRECT in the last 72 hours. Thyroid Function Tests: No results for input(s): TSH, T4TOTAL, FREET4, T3FREE, THYROIDAB in the last 72 hours. Anemia Panel: No results for input(s): VITAMINB12, FOLATE, FERRITIN, TIBC, IRON, RETICCTPCT in the last 72 hours. Sepsis Labs: No results for input(s): PROCALCITON, LATICACIDVEN in the last 168 hours.  Recent Results (from the past 240 hour(s))  Resp Panel by RT-PCR (Flu A&B, Covid) Nasopharyngeal Swab     Status: None   Collection Time: 07/18/21  4:13 PM   Specimen: Nasopharyngeal Swab; Nasopharyngeal(NP) swabs in vial transport medium  Result Value Ref Range Status   SARS Coronavirus 2 by RT PCR NEGATIVE NEGATIVE Final    Comment: (NOTE) SARS-CoV-2 target nucleic acids are NOT DETECTED.  The SARS-CoV-2 RNA is generally detectable in upper respiratory specimens during the acute phase of infection. The lowest concentration of SARS-CoV-2 viral copies this assay can detect is 138 copies/mL. A negative result does not preclude SARS-Cov-2 infection and should not be used as the sole basis for treatment or other patient management decisions. A negative result may occur with  improper specimen collection/handling, submission of specimen other than nasopharyngeal swab, presence of viral mutation(s) within the areas targeted by this assay, and inadequate number of viral copies(<138 copies/mL). A negative result must be combined with clinical observations, patient history, and epidemiological information. The expected result is Negative.  Fact Sheet for Patients:  EntrepreneurPulse.com.au  Fact Sheet for Healthcare Providers:  IncredibleEmployment.be  This test is no t yet approved or cleared by the [place] FDA and  has been authorized for detection and/or diagnosis of SARS-CoV-2 by FDA under an Emergency Use  Authorization (EUA). This EUA will remain  in effect (meaning this test can be used) for the duration of the COVID-19 declaration under Section 564(b)(1) of the Act, 21 U.S.C.section 360bbb-3(b)(1), unless the authorization is terminated  or revoked sooner.       Influenza A by PCR NEGATIVE NEGATIVE Final   Influenza B by PCR NEGATIVE NEGATIVE Final    Comment: (NOTE) The Xpert Xpress SARS-CoV-2/FLU/RSV plus assay is intended as an aid in the diagnosis of influenza from Nasopharyngeal swab specimens and should not be used as a sole basis for treatment. Nasal washings and aspirates are unacceptable for Xpert Xpress SARS-CoV-2/FLU/RSV testing.  Fact Sheet for Patients: EntrepreneurPulse.com.au  Fact Sheet for Healthcare Providers: IncredibleEmployment.be  This test is not yet approved or cleared by the [place] FDA and has been authorized for detection and/or diagnosis of SARS-CoV-2 by FDA under an Emergency Use Authorization (EUA). This EUA will remain in effect (meaning this test can be used) for the duration of the COVID-19 declaration under Section 564(b)(1) of the Act, 21 U.S.C. section 360bbb-3(b)(1), unless the authorization is terminated or revoked.  Performed at [facility], [place] [address] [place], [place] [address]  Radiology Studies: DG Chest 2 View  Result Date: 07/18/2021 CLINICAL DATA:  Epigastric pain and nausea. EXAM: CHEST - 2 VIEW COMPARISON:  July 27, 2020 FINDINGS: Tortuosity of the aorta. Cardiomediastinal silhouette is normal. Mediastinal contours appear intact. Linear peribronchial opacities in bilateral lung bases, left greater than right. Osseous structures are without acute abnormality. Soft tissues are grossly normal. IMPRESSION: Linear peribronchial opacities in bilateral lung bases, left greater than right. This may represent atelectasis, bronchitic changes or atypical pneumonia.  Electronically Signed   By: Fidela Salisbury M.D.   On: 07/18/2021 13:50   CT Abdomen Pelvis W Contrast  Result Date: 07/18/2021 CLINICAL DATA:  Epigastric pain. EXAM: CT ABDOMEN AND PELVIS WITH CONTRAST TECHNIQUE: Multidetector CT imaging of the abdomen and pelvis was performed using the standard protocol following bolus administration of intravenous contrast. 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 Hepatobiliary: Intrahepatic and extrahepatic biliary ductal dilation. Otherwise normal attenuation of the liver. The gallbladder is normally distended. The extrahepatic common bile duct measures 1.4 cm. Pancreas: Suspected hypoattenuated mass within the proximal body of the pancreas measures 2.3 x 2.6 x 2.0 cm. There is a downstream dilation of the main pancreatic duct and its branches. It is likely the cause for the biliary ductal dilation as well as it compresses the ampulla. Spleen: Normal in size without focal abnormality. Adrenals/Urinary Tract: Adrenal glands are unremarkable. Kidneys are normal, without renal calculi, focal lesion, or hydronephrosis. Bladder is unremarkable. Stomach/Bowel: Stomach is within normal limits. No evidence of bowel wall thickening, distention, or inflammatory changes. Vascular/Lymphatic: Aortic atherosclerosis. No enlarged abdominal or pelvic lymph nodes. Reproductive: Prostate is unremarkable. Other: No abdominal wall hernia or abnormality. No abdominopelvic ascites. Musculoskeletal: Spondylosis of the thoracolumbar spine. IMPRESSION: 1. Suspected solid mass within the proximal body of the pancreas measures 2.3 x 2.6 x 2.0 cm. There is downstream dilation of the main pancreatic duct and its branches. There is also intra and extrahepatic biliary ductal dilation. Further evaluation with MRI of the abdomen, pancreatic protocol, when clinically feasible, may  be considered. 2. 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 3. Aortic atherosclerosis. Aortic Atherosclerosis (ICD10-I70.0). These results were called by telephone at the time of interpretation on 07/18/2021 at 4:03 pm to provider Carmin Muskrat , who verbally acknowledged these results. Electronically Signed   By: Fidela Salisbury M.D.   On: 07/18/2021 16:03        Scheduled Meds:  bisoprolol-hydrochlorothiazide  1 tablet Oral Daily   escitalopram  20 mg Oral Daily   famotidine  20 mg Oral Daily   levothyroxine  112 mcg Oral Daily   pantoprazole  40 mg Oral Daily   sodium chloride flush  3 mL Intravenous Q12H   Continuous Infusions:          Aline August, MD [facility] 07/20/2021, 7:19 AM

## 2021-07-20 NOTE — Op Note (Signed)
[facility] Patient Name: Angel Keller Procedure Date : 07/20/2021 MRN: [id number] Attending MD: Gatha Mayer , MD Date of Birth: 11/05/1940 CSN: [id number] Age: 80 Admit Type: Inpatient Procedure:                ERCP Indications:              Jaundice, Tumor of the head of pancreas Providers:                Gatha Mayer, MD, Lurline Del, RN, Lodema Hong                            Technician, Technician, Tyna Jaksch Technician Referring MD:              Medicines:                General Anesthesia, Unasyn 1.5 g IV Complications:            No immediate complications. Estimated Blood Loss:     Estimated blood loss was minimal. Procedure:                Pre-Anesthesia Assessment:                           - Prior to the procedure, a History and Physical                            was performed, and patient medications and                            allergies were reviewed. The patient's tolerance of                            previous anesthesia was also reviewed. The risks                            and benefits of the procedure and the sedation                            options and risks were discussed with the patient.                            All questions were answered, and informed consent                            was obtained. Prior Anticoagulants: The patient has                            taken no previous anticoagulant or antiplatelet                            agents. ASA Grade Assessment: III - A patient with                            severe systemic disease. After reviewing the risks  and benefits, the patient was deemed in                            satisfactory condition to undergo the procedure.                           After obtaining informed consent, the scope was                            passed under direct vision. Throughout the                            procedure, the patient's blood pressure, pulse, and                             oxygen saturations were monitored continuously. The                            [id number]-Q190V ([id number]) Olympus duodenoscope was                            introduced through the mouth, The procedure was                            aborted. The scope was not inserted. bile ducts                            Medications were duodenum. The ERCP was performed                            with difficulty due to challenging cannulation. The                            patient tolerated the procedure well. Scope In: Scope Out: Findings:      The scout film was normal. Esophagus not seen well. Stomach grossly       normal. Papilla seen in duodenum, normal size. Some white mucoid fluid       draining at times but no bile. Easily seated sphincterotome in papilla.       Hydrotome wire passed multiple times without success and also tried a       IT trainer. Wire would pass and abut suspected tumor I think then       would track in submucosal areas. It passed through more proximal mucosa       to re-enter the lumen 2 times and so did the sphincterotome on one       occasion when Revolution wire used. Procedure aborted. Impression:               - The examination was suspicious for a biliary                            stricture in the bile duct.                           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                           - Attempts at a cholangiogram failed. Recommendation:           - Resume regular diet.                           - Return patient to hospital ward for ongoing care.                           - Next options would be ERCP by another physician                            with advanced cannulation skills (needle-knife may                            be needed) vs IR and percutaneous approach. We will                            sort out by tomorrow AM.                           Will make NPO after  MN Procedure Code(s):        --- Professional ---                           20355, 53, Endoscopic retrograde                            cholangiopancreatography (ERCP); diagnostic,                            including collection of specimen(s) by brushing or                            washing, when performed (separate procedure) Diagnosis Code(s):        --- Professional ---                           R17, Unspecified jaundice                           D49.0, Neoplasm of unspecified behavior of                            digestive system CPT copyright 2019 American Medical Association. All rights reserved. The codes documented in this report are preliminary and upon coder review may  be revised to meet current compliance requirements. Gatha Mayer, MD 07/20/2021 3:09:16 PM This report has been signed electronically. Number of Addenda: 0

## 2021-07-20 NOTE — Anesthesia Procedure Notes (Signed)

## 2021-07-20 NOTE — Progress Notes (Signed)
Initial Nutrition Assessment  DOCUMENTATION CODES:   Not applicable  INTERVENTION:   Advance diet as medically appropriate Encourage good PO intake  NUTRITION DIAGNOSIS:   Increased nutrient needs related to acute illness as evidenced by estimated needs.  GOAL:   Patient will meet greater than or equal to 90% of their needs  MONITOR:   Diet advancement, Skin, Labs, PO intake  REASON FOR ASSESSMENT:   Malnutrition Screening Tool    ASSESSMENT:   80 y.o. male presented with ongoing epigastric pain; pt was seen during November and started on a PPI. PMH includes GERD, HTN, hypothyroidism, and diverticulitis. Pt admitted with a new pancreatic mass and obstructive jaundice.   12/06 - Endoscopic retrograde cholangiopancreatography  RD attempted to see pt x2.  Per EMR, pt consumed 75% of his dinner on 12/05.   Per EMR, pt has not had any weight loss within the past year.   Medications reviewed and include: Pepcid, Protonix Labs reviewed: Sodium 133  NUTRITION - FOCUSED PHYSICAL EXAM:  Deferred to follow-up.  Diet Order:   Diet Order             Diet NPO time specified Except for: Sips with Meds  Diet effective midnight                   EDUCATION NEEDS:   No education needs have been identified at this time  Skin:  Skin Assessment: Reviewed RN Assessment  Last BM:  07/19/2021  Height:   Ht Readings from Last 1 Encounters:  07/20/21 5\' 10"  (1.778 m)    Weight:   Wt Readings from Last 1 Encounters:  07/20/21 80.4 kg    Ideal Body Weight:  75.5 kg  BMI:  Body mass index is 25.43 kg/m.  Estimated Nutritional Needs:   Kcal:  2000-2200  Protein:  100-115 grams  Fluid:  > 2 L    Aaryanna Hyden Louie Casa, RD, LDN Clinical Dietitian See [facility] for contact information.

## 2021-07-20 NOTE — Progress Notes (Signed)
IR procedure request for biliary drain placement.   80 y.o. male inpatient. History of Hx of diverticulosis, HTN, GERD, HLD, presented to the ED at [facility] with abdominal pain X 3 weeks. Found to have a pancreatic mass with biliary obstruction.CT abd pelvis from 12.4.22 reads  Suspected solid mass within the proximal body of the pancreas measures 2.3 x 2.6 x 2.0 cm. There is downstream dilation of the main pancreatic duct and its branches. There is also intra and extrahepatic biliary ductal dilation. Further evaluation with MRI of the abdomen, pancreatic protocol, when clinically feasible, may be considered. Attempted ERCP stent placement on 12.6.22 was unsuccessful. Total bilirubin 13.2, AST 72, ALT 100, GFR 56.67, Alkaline phos 344, lipase 69. NKDA.  IR consulted for possible bilary drain placement. Case has been reviewed and procedure approved by Dr. Everlean Alstrom.  Patient tentatively scheduled for 12.7.22.  Team instructed to: Keep Patient to be NPO after midnight Hold prophylactic anticoagulation 24 hours prior to scheduled procedure.  IR will call patient when ready.

## 2021-07-20 NOTE — Anesthesia Preprocedure Evaluation (Addendum)
Anesthesia Evaluation  Patient identified by MRN, date of birth, ID band Patient awake    Reviewed: Allergy & Precautions, NPO status , Patient's Chart, lab work & pertinent test results, reviewed documented beta blocker date and time   History of Anesthesia Complications Negative for: history of anesthetic complications  Airway Mallampati: II  TM Distance: >3 FB Neck ROM: Full    Dental  (+) Dental Advisory Given, Edentulous Upper, Edentulous Lower   Pulmonary shortness of breath, neg COPD, former smoker,    breath sounds clear to auscultation       Cardiovascular hypertension, Pt. on medications and Pt. on home beta blockers (-) angina(-) Past MI and (-) CHF  Rhythm:Regular     Neuro/Psych  Headaches, PSYCHIATRIC DISORDERS Anxiety Depression    GI/Hepatic GERD  Medicated and Controlled,Lab Results      Component                Value               Date                      ALT                      100 (H)             07/20/2021                AST                      72 (H)              07/20/2021                ALKPHOS                  344 (H)             07/20/2021                BILITOT                  13.2 (H)            07/20/2021              Endo/Other  Hypothyroidism   Renal/GU Lab Results      Component                Value               Date                      CREATININE               1.15                07/20/2021                Musculoskeletal  (+) Arthritis ,   Abdominal   Peds  Hematology   Anesthesia Other Findings   Reproductive/Obstetrics                            Anesthesia Physical Anesthesia Plan  ASA: 3  Anesthesia Plan: General   Post-op Pain Management:    Induction: Intravenous  PONV Risk Score and Plan: 2 and Ondansetron and Dexamethasone  Airway Management Planned: Oral ETT  Additional Equipment: None  Intra-op Plan:   Post-operative Plan:  Extubation in OR  Informed Consent: I have reviewed the patients History and Physical, chart, labs and discussed the procedure including the risks, benefits and alternatives for the proposed anesthesia with the patient or authorized representative who has indicated his/her understanding and acceptance.     Dental advisory given  Plan Discussed with: CRNA and Anesthesiologist  Anesthesia Plan Comments:         Anesthesia Quick Evaluation

## 2021-07-20 NOTE — Transfer of Care (Signed)
Immediate Anesthesia Transfer of Care Note  Patient: Angel SEAMAN Sr.  Procedure(s) Performed: ENDOSCOPIC RETROGRADE CHOLANGIOPANCREATOGRAPHY (ERCP) WITH PROPOFOL ERCP aborted  Patient Location: PACU  Anesthesia Type:General  Level of Consciousness: drowsy  Airway & Oxygen Therapy: Patient Spontanous Breathing and Patient connected to face mask oxygen  Post-op Assessment: Report given to RN and Post -op Vital signs reviewed and stable  Post vital signs: Reviewed and stable  Last Vitals:  Vitals Value Taken Time  BP 189/72 07/20/21 1458  Temp    Pulse 62 07/20/21 1459  Resp 20 07/20/21 1459  SpO2 100 % 07/20/21 1459  Vitals shown include unvalidated device data.  Last Pain:  Vitals:   07/20/21 0800  TempSrc:   PainSc: 0-No pain         Complications: No notable events documented.

## 2021-07-20 NOTE — Plan of Care (Signed)

## 2021-07-21 ENCOUNTER — Encounter (HOSPITAL_COMMUNITY): Payer: Self-pay | Admitting: Internal Medicine

## 2021-07-21 DIAGNOSIS — E039 Hypothyroidism, unspecified: Secondary | ICD-10-CM | POA: Diagnosis not present

## 2021-07-21 DIAGNOSIS — I10 Essential (primary) hypertension: Secondary | ICD-10-CM | POA: Diagnosis not present

## 2021-07-21 DIAGNOSIS — K831 Obstruction of bile duct: Secondary | ICD-10-CM | POA: Diagnosis not present

## 2021-07-21 DIAGNOSIS — F419 Anxiety disorder, unspecified: Secondary | ICD-10-CM | POA: Diagnosis not present

## 2021-07-21 DIAGNOSIS — K8689 Other specified diseases of pancreas: Secondary | ICD-10-CM | POA: Diagnosis not present

## 2021-07-21 LAB — CBC
HCT: 36.7 % — ABNORMAL LOW (ref 39.0–52.0)
Hemoglobin: 12.3 g/dL — ABNORMAL LOW (ref 13.0–17.0)
MCH: 26.6 pg (ref 26.0–34.0)
MCHC: 33.5 g/dL (ref 30.0–36.0)
MCV: 79.4 fL — ABNORMAL LOW (ref 80.0–100.0)
Platelets: 235 10*3/uL (ref 150–400)
RBC: 4.62 MIL/uL (ref 4.22–5.81)
RDW: 20.2 % — ABNORMAL HIGH (ref 11.5–15.5)
WBC: 5.6 10*3/uL (ref 4.0–10.5)
nRBC: 0 % (ref 0.0–0.2)

## 2021-07-21 LAB — COMPREHENSIVE METABOLIC PANEL
ALT: 103 U/L — ABNORMAL HIGH (ref 0–44)
AST: 91 U/L — ABNORMAL HIGH (ref 15–41)
Albumin: 2.5 g/dL — ABNORMAL LOW (ref 3.5–5.0)
Alkaline Phosphatase: 415 U/L — ABNORMAL HIGH (ref 38–126)
Anion gap: 10 (ref 5–15)
BUN: 13 mg/dL (ref 8–23)
CO2: 26 mmol/L (ref 22–32)
Calcium: 9.1 mg/dL (ref 8.9–10.3)
Chloride: 98 mmol/L (ref 98–111)
Creatinine, Ser: 1.23 mg/dL (ref 0.61–1.24)
GFR, Estimated: 59 mL/min — ABNORMAL LOW (ref >60)
Glucose, Bld: 168 mg/dL — ABNORMAL HIGH (ref 70–99)
Potassium: 4.9 mmol/L (ref 3.5–5.1)
Sodium: 134 mmol/L — ABNORMAL LOW (ref 135–145)
Total Bilirubin: 12.9 mg/dL — ABNORMAL HIGH (ref 0.3–1.2)
Total Protein: 6.3 g/dL — ABNORMAL LOW (ref 6.5–8.1)

## 2021-07-21 LAB — PROTIME-INR
INR: 1.7 — ABNORMAL HIGH (ref 0.8–1.2)
Prothrombin Time: 20.4 seconds — ABNORMAL HIGH (ref 11.4–15.2)

## 2021-07-21 LAB — MAGNESIUM: Magnesium: 2.4 mg/dL (ref 1.7–2.4)

## 2021-07-21 MED ORDER — LOPERAMIDE HCL 2 MG PO CAPS
2.0000 mg | ORAL_CAPSULE | Freq: Once | ORAL | Status: AC
  Administered 2021-07-21: 2 mg via ORAL
  Filled 2021-07-21: qty 1

## 2021-07-21 MED ORDER — VITAMIN K1 10 MG/ML IJ SOLN
5.0000 mg | Freq: Every day | INTRAVENOUS | Status: AC
  Administered 2021-07-21 – 2021-07-23 (×3): 5 mg via INTRAVENOUS
  Filled 2021-07-21 (×3): qty 0.5

## 2021-07-21 MED ORDER — SODIUM CHLORIDE 0.9 % IV SOLN
2.0000 g | INTRAVENOUS | Status: AC
  Administered 2021-07-22: 2 g via INTRAVENOUS
  Filled 2021-07-21 (×2): qty 2

## 2021-07-21 NOTE — Progress Notes (Addendum)
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 He is an 80 y.o. male  with PMH of diverticulosis, anxiety/depression, hypothyroidism, HTN, GERD, HLD, who presented to the ED at [facility] on 12/4 with abdominal pain X 3 weeks. He was found to have a pancreatic mass with biliary obstruction.CT abd pelvis from 07/18/21 revealed: suspected solid mass within the proximal body of the pancreas measures 2.3 x 2.6 x 2.0 cm. There is downstream dilation of the main pancreatic duct and its branches. There is also intra and extrahepatic biliary ductal dilation. Attempted ERCP stent placement on 12/6 was unsuccessful. Request now received for PTC with biliary drain placement. Current labs include WBC 5.6, HGB 12.3, PLTS 235K, PT 20.4/INR 1.7, CREAT 1.23, T BILI 12.9(13.2). He denies fever,HA,CP,dyspnea, cough, vomiting or bleeding. He does have epigastric pain, occ nausea, chronic back pain.    Past Medical History:  Diagnosis Date   Aneurysm ([place])    Anxiety    Arthritis    Depression    Headache    HLD (hyperlipidemia)    Hx of inguinal hernia repair    Hypertension    Hypothyroidism    Overactive bladder    Pneumonia    Shingles    Thyroid disease    Past Surgical History:  Procedure Laterality Date   BILATERAL CARPAL TUNNEL RELEASE     CATARACT EXTRACTION, BILATERAL Bilateral    ELBOW SURGERY Bilateral    INGUINAL HERNIA REPAIR Right 1951   IR ANGIO INTRA EXTRACRAN SEL COM CAROTID INNOMINATE BILAT MOD SED  02/20/2019   IR GENERIC HISTORICAL  10/28/2016   IR RADIOLOGIST EVAL & MGMT 10/28/2016 [facility]-INTERV RAD   RADIOLOGY WITH ANESTHESIA N/A 10/21/2015   Procedure: RADIOLOGY WITH ANESTHESIA;  Surgeon: Luanne Bras, MD;  Location: [place];  Service:  Radiology;  Laterality: N/A;   RADIOLOGY WITH ANESTHESIA N/A 02/20/2019   Procedure: [employer];  Surgeon: Luanne Bras, MD;  Location: [place];  Service: Radiology;  Laterality: N/A;   ROTATOR CUFF REPAIR Bilateral    VARICOSE VEIN SURGERY        Allergies: Patient has no known allergies.  Medications: Prior to Admission medications   Medication Sig Start Date End Date Taking? Authorizing Provider  aspirin EC 81 MG tablet Take 1 tablet (81 mg total) by mouth daily. Hold while on blood thinner/eliquis for 3 months, then resume Patient taking differently: Take 81 mg by mouth daily. 07/30/20  Yes Rai, Ripudeep K, MD  bisoprolol-hydrochlorothiazide (ZIAC) 2.5-6.25 MG tablet Take 1 tablet by mouth every morning. 06/20/15  Yes [provider]  Cholecalciferol (VITAMIN D3) 50 MCG (2000 UT) TABS Take 2,000 Units by mouth daily.   Yes [provider]  escitalopram (LEXAPRO) 20 MG tablet Take 20 mg by mouth daily. 12/22/18  Yes [provider]  lovastatin (MEVACOR) 20 MG tablet Take 20 mg by mouth at bedtime. 07/25/15  Yes [provider]  pantoprazole (PROTONIX) 40 MG tablet Take 1 tablet (40 mg total) by mouth daily. 07/12/21  Yes Ronnell Freshwater, NP  SYNTHROID 112 MCG tablet Take 112 mcg by mouth daily before breakfast. 07/02/20  Yes [provider]  vitamin B-12 (CYANOCOBALAMIN) 1000 MCG tablet Take 2,000 mcg by mouth daily.   Yes [provider]  allopurinol (ZYLOPRIM) 300 MG tablet Take 300  mg by mouth daily. Patient not taking: Reported on 07/18/2021 02/04/20   [provider]  amoxicillin (AMOXIL) 875 MG tablet Take 1 tablet (875 mg total) by mouth 2 (two) times daily. Patient not taking: Reported on 07/18/2021 07/02/21   Ronnell Freshwater, NP     Vital Signs: BP (!) 163/127 (BP Location: Left Arm)   Pulse 62   Temp 97.6 F (36.4 C) (Oral)   Resp 17   Ht _0  (1.778 m)   Wt 174 lb 9.7 oz (79.2 kg)   SpO2 95%   BMI  25.05 kg/m   Physical Exam awake/alert; scleral icterus; chest- sl dim BS bases; heart- RRR; abd- soft,+BS, tender epigastric region to palpation; no LE edema  Imaging: DG Chest 2 View  Result Date: 07/18/2021 CLINICAL DATA:  Epigastric pain and nausea. EXAM: CHEST - 2 VIEW COMPARISON:  July 27, 2020 FINDINGS: Tortuosity of the aorta. Cardiomediastinal silhouette is normal. Mediastinal contours appear intact. Linear peribronchial opacities in bilateral lung bases, left greater than right. Osseous structures are without acute abnormality. Soft tissues are grossly normal. IMPRESSION: Linear peribronchial opacities in bilateral lung bases, left greater than right. This may represent atelectasis, bronchitic changes or atypical pneumonia. Electronically Signed   By: Fidela Salisbury M.D.   On: 07/18/2021 13:50   CT Abdomen Pelvis W Contrast  Result Date: 07/18/2021 CLINICAL DATA:  Epigastric pain. EXAM: CT ABDOMEN AND PELVIS WITH CONTRAST TECHNIQUE: Multidetector CT imaging of the abdomen and pelvis was performed using the standard protocol following bolus administration of intravenous contrast. 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 Hepatobiliary: Intrahepatic and extrahepatic biliary ductal dilation. Otherwise normal attenuation of the liver. The gallbladder is normally distended. The extrahepatic common bile duct measures 1.4 cm. Pancreas: Suspected hypoattenuated mass within the proximal body of the pancreas measures 2.3 x 2.6 x 2.0 cm. There is a downstream dilation of the main pancreatic duct and its branches. It is likely the cause for the biliary ductal dilation as well as it compresses the ampulla. Spleen: Normal in size without focal abnormality. Adrenals/Urinary Tract: Adrenal glands are unremarkable. Kidneys are normal, without renal calculi, focal lesion, or  hydronephrosis. Bladder is unremarkable. Stomach/Bowel: Stomach is within normal limits. No evidence of bowel wall thickening, distention, or inflammatory changes. Vascular/Lymphatic: Aortic atherosclerosis. No enlarged abdominal or pelvic lymph nodes. Reproductive: Prostate is unremarkable. Other: No abdominal wall hernia or abnormality. No abdominopelvic ascites. Musculoskeletal: Spondylosis of the thoracolumbar spine. IMPRESSION: 1. Suspected solid mass within the proximal body of the pancreas measures 2.3 x 2.6 x 2.0 cm. There is downstream dilation of the main pancreatic duct and its branches. There is also intra and extrahepatic biliary ductal dilation. Further evaluation with MRI of the abdomen, pancreatic protocol, when clinically feasible, may be considered. 2. 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 3. Aortic atherosclerosis. Aortic Atherosclerosis (ICD10-I70.0). These results were called by telephone at the time of interpretation on 07/18/2021 at 4:03 pm to provider RCarmin Muskrat, who verbally acknowledged these results. Electronically Signed   By: DFidela SalisburyM.D.   On: 07/18/2021 16:03   DG ERCP  Result Date: 07/20/2021 CLINICAL DATA:  Pancreatic mass.  ERCP. EXAM: ERCP TECHNIQUE: Multiple spot images obtained with the fluoroscopic device and submitted for interpretation post-procedure. FLUOROSCOPY TIME:  1 minute, 58 seconds (23.6 mGy) COMPARISON:  CT abdomen and pelvis - 07/18/2021 FINDINGS: Single spot  intraoperative fluoroscopic image of the right upper abdominal quadrant is provided for review. There is no definitive opacification the CBD or pancreatic duct. IMPRESSION: Nondiagnostic ERCP as above. Correlation with the operative report is advised. These images were submitted for radiologic interpretation only. Please see the procedural report for the amount of contrast and the fluoroscopy time utilized. Electronically Signed   By: Sandi Mariscal M.D.   On:  07/20/2021 15:01    Labs:  CBC: Recent Labs    07/18/21 1225 07/19/21 0431 07/20/21 0049 07/21/21 0114  WBC 8.6 5.6 6.7 5.6  HGB 12.3* 11.0* 12.3* 12.3*  HCT 37.3* 33.4* 35.3* 36.7*  PLT 214 172 196 235    COAGS: Recent Labs    07/19/21 1007 07/20/21 0049 07/21/21 0114  INR 2.0* 2.0* 1.7*    BMP: Recent Labs    07/18/21 1225 07/19/21 0431 07/20/21 0049 07/21/21 0114  NA 133* 134* 133* 134*  K 4.0 3.3* 3.8 4.9  CL 100 102 100 98  CO2 _0 GLUCOSE 156* 105* 147* 168*  BUN _1 CALCIUM 9.2 8.2* 8.8* 9.1  CREATININE 1.27* 0.97 1.15 1.23  GFRNONAA 57* >60 >60 59*    LIVER FUNCTION TESTS: Recent Labs    07/18/21 1225 07/19/21 0431 07/20/21 0049 07/21/21 0114  BILITOT 15.9* 12.9* 13.2* 12.9*  AST 92* 65* 72* 91*  ALT 134* 98* 100* 103*  ALKPHOS 385* 315* 344* 415*  PROT 7.0 5.8* 6.2* 6.3*  ALBUMIN 3.2* 2.5* 2.6* 2.5*    Assessment and Plan: Pt known to NIR service from 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 He is an 80 y.o. male  with PMH of diverticulosis, anxiety/depression, hypothyroidism, HTN, GERD, HLD, who presented to the ED at [facility] on 12/4 with abdominal pain X 3 weeks. He was found to have a pancreatic mass with biliary obstruction.CT abd pelvis from 07/18/21 revealed: suspected solid mass within the proximal body of the pancreas measures 2.3 x 2.6 x 2.0 cm. There is downstream dilation of the main pancreatic duct and its branches. There is also intra and extrahepatic biliary ductal dilation. Attempted ERCP stent placement on 12/6 was unsuccessful. Request now received for PTC with biliary drain placement. Current labs include WBC 5.6, HGB 12.3, PLTS 235K, PT 20.4/INR 1.7, CREAT 1.23, T BILI 12.9(13.2). CA 19-9 203. Imaging studies have been reviewed by Dr. Kathlene Cote. Details/risks of procedure, incl but not limited to, internal bleeding, infection, injury to adjacent structures d/w pt with his understanding  and consent. Dr. Kathlene Cote would like INR lower than 1.7 before proceeding with case. GI team notified and they plan to order vitamin K. If INR remains elevated may need FFP. Will tent plan case for 12/8.   Electronically Signed: D. Rowe Robert, PA-C 07/21/2021, 8:58 AM   I spent a total of 30 minutes at the the patient's bedside AND on the patient's hospital floor or unit, greater than 50% of which was counseling/coordinating care for percutaneous transhepatic cholangiogram with biliary drain placement    Patient ID: [name], male   DOB: 18-May-1941, 80 y.o.   MRN: [id number]

## 2021-07-21 NOTE — Anesthesia Postprocedure Evaluation (Signed)
Anesthesia Post Note  Patient: Angel WAAS Sr.  Procedure(s) Performed: ENDOSCOPIC RETROGRADE CHOLANGIOPANCREATOGRAPHY (ERCP) WITH PROPOFOL ERCP aborted     Patient location during evaluation: Endoscopy Anesthesia Type: General Level of consciousness: patient cooperative and awake Pain management: pain level controlled Vital Signs Assessment: post-procedure vital signs reviewed and stable Respiratory status: spontaneous breathing, nonlabored ventilation, respiratory function stable and patient connected to nasal cannula oxygen Cardiovascular status: blood pressure returned to baseline and stable Postop Assessment: no apparent nausea or vomiting Anesthetic complications: no   No notable events documented.  Last Vitals:  Vitals:   07/21/21 0747 07/21/21 1227  BP: (!) 163/127 129/70  Pulse: 62 (!) 50  Resp: 17 18  Temp: 36.4 C 36.7 C  SpO2: 95%     Last Pain:  Vitals:   07/21/21 1227  TempSrc: Oral  PainSc:                  Jamesetta Greenhalgh

## 2021-07-21 NOTE — Progress Notes (Signed)
PROGRESS NOTE        PATIENT DETAILS Name: Angel Keller. Age: 80 y.o. Sex: male Date of Birth: Dec 10, 1940 Admit Date: 07/18/2021 Admitting Physician Aline August, MD [id number], Greer Ee, NP  Brief Narrative: Patient is a 80 y.o. 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  See below for further details.  Subjective: Continues to have upper abdominal discomfort.  Objective: Vitals: Blood pressure (!) 163/127, pulse 62, temperature 97.6 F (36.4 C), temperature source Oral, resp. rate 17, height 5\' 10"  (1.778 m), weight 79.2 kg, SpO2 95 %.   Exam: Gen Exam:Alert awake-not in any distress HEENT:atraumatic, normocephalic Chest: B/L clear to auscultation anteriorly CVS:S1S2 regular Abdomen:soft non tender, non distended Extremities:no edema Neurology: Non focal Skin: no rash  Pertinent Labs/Radiology: Recent Labs  Lab 07/18/21 1225 07/19/21 0431 07/20/21 0049 07/21/21 0114  WBC 8.6 5.6 6.7 5.6  HGB 12.3* 11.0* 12.3* 12.3*  PLT 214 172 196 235  NA 133* 134* 133* 134*  K 4.0 3.3* 3.8 4.9  CREATININE 1.27* 0.97 1.15 1.23  AST 92* 65* 72* 91*  ALT 134* 98* 100* 103*  ALKPHOS 385* 315* 344* 415*  BILITOT 15.9* 12.9* 13.2* 12.9*     Assessment/Plan: 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  Hypothyroidism: Continue Synthroid  HTN: BP relatively stable-continue bisoprolol and HCTZ.  HLD: Hold statins due to elevated LFTs.  Mood disorder: Stable-continue Lexapro  Nutrition Status: Nutrition Problem: Increased nutrient needs Etiology: acute illness Signs/Symptoms: estimated needs Interventions: MVI  BMI Estimated body mass index is 25.05 kg/m as calculated from the following:   Height as of  this encounter: 5\' 10"  (1.778 m).   Weight as of this encounter: 79.2 kg.   Procedures: None Consults: GI, IR DVT Prophylaxis: SCDs Code Status:Full code  Family Communication: None at bedside  Time spent: 25- minutes-Greater than 50% of this time was spent in counseling, explanation of diagnosis, planning of further management, and coordination of care.   Disposition Plan: Status is: Inpatient  Remains inpatient appropriate because: Obstructive jaundice due to pancreatic mass-failed ERCP-for percutaneous biliary drain placement by IR on 12/8.    Diet: Diet Order             Diet NPO time specified Except for: Sips with Meds  Diet effective midnight           Diet Carb Modified Fluid consistency: Thin; Room service appropriate? Yes  Diet effective now                     Antimicrobial agents: Anti-infectives (From admission, onward)    Start     Dose/Rate Route Frequency Ordered Stop   07/22/21 0600  cefOXitin (MEFOXIN) 2 g in sodium chloride 0.9 % 100 mL IVPB        2 g 200 mL/hr over 30 Minutes Intravenous On call 07/21/21 0936 07/23/21 0600   07/21/21 0600  cefTRIAXone (ROCEPHIN) 1 g in sodium chloride 0.9 % 100 mL IVPB        1 g 200 mL/hr over 30 Minutes Intravenous  Once 07/20/21 1651     07/20/21 1245  Ampicillin-Sulbactam (UNASYN) 3 g in sodium chloride 0.9 % 100 mL  IVPB        3 g 200 mL/hr over 30 Minutes Intravenous  Once 07/20/21 1240 07/20/21 1439        MEDICATIONS: Scheduled Meds:  bisoprolol-hydrochlorothiazide  1 tablet Oral Daily   escitalopram  20 mg Oral Daily   famotidine  20 mg Oral Daily   indomethacin  100 mg Rectal Once   levothyroxine  112 mcg Oral Daily   pantoprazole  40 mg Oral Daily   sodium chloride flush  3 mL Intravenous Q12H   Continuous Infusions:  [START ON 07/22/2021] cefOXitin     cefTRIAXone (ROCEPHIN)  IV     phytonadione (VITAMIN K) IV     PRN Meds:.acetaminophen **OR** acetaminophen, hydrALAZINE, ondansetron  (ZOFRAN) IV, polyethylene glycol   I have personally reviewed following labs and imaging studies  LABORATORY DATA: CBC: Recent Labs  Lab 07/18/21 1225 07/19/21 0431 07/20/21 0049 07/21/21 0114  WBC 8.6 5.6 6.7 5.6  NEUTROABS 5.9  --  4.6  --   HGB 12.3* 11.0* 12.3* 12.3*  HCT 37.3* 33.4* 35.3* 36.7*  MCV 81.6 82.1 79.7* 79.4*  PLT 214 172 196 902    Basic Metabolic Panel: Recent Labs  Lab 07/18/21 1225 07/19/21 0431 07/20/21 0049 07/21/21 0114  NA 133* 134* 133* 134*  K 4.0 3.3* 3.8 4.9  CL 100 102 100 98  CO2 25 24 25 26   GLUCOSE 156* 105* 147* 168*  BUN 14 10 9 13   CREATININE 1.27* 0.97 1.15 1.23  CALCIUM 9.2 8.2* 8.8* 9.1  MG  --   --  2.1 2.4    GFR: Estimated Creatinine Clearance: 48.6 mL/min (by C-G formula based on SCr of 1.23 mg/dL).  Liver Function Tests: Recent Labs  Lab 07/18/21 1225 07/19/21 0431 07/20/21 0049 07/21/21 0114  AST 92* 65* 72* 91*  ALT 134* 98* 100* 103*  ALKPHOS 385* 315* 344* 415*  BILITOT 15.9* 12.9* 13.2* 12.9*  PROT 7.0 5.8* 6.2* 6.3*  ALBUMIN 3.2* 2.5* 2.6* 2.5*   Recent Labs  Lab 07/18/21 1225  LIPASE 69*   No results for input(s): AMMONIA in the last 168 hours.  Coagulation Profile: Recent Labs  Lab 07/19/21 1007 07/20/21 0049 07/21/21 0114  INR 2.0* 2.0* 1.7*    Cardiac Enzymes: No results for input(s): CKTOTAL, CKMB, CKMBINDEX, TROPONINI in the last 168 hours.  BNP (last 3 results) No results for input(s): PROBNP in the last 8760 hours.  Lipid Profile: No results for input(s): CHOL, HDL, LDLCALC, TRIG, CHOLHDL, LDLDIRECT in the last 72 hours.  Thyroid Function Tests: No results for input(s): TSH, T4TOTAL, FREET4, T3FREE, THYROIDAB in the last 72 hours.  Anemia Panel: No results for input(s): VITAMINB12, FOLATE, FERRITIN, TIBC, IRON, RETICCTPCT in the last 72 hours.  Urine analysis:    Component Value Date/Time   COLORURINE AMBER (A) 07/18/2021 1223   APPEARANCEUR CLEAR 07/18/2021 1223    LABSPEC 1.026 07/18/2021 1223   PHURINE 5.0 07/18/2021 1223   GLUCOSEU NEGATIVE 07/18/2021 1223   GLUCOSEU NEGATIVE 11/07/2008 0000   HGBUR SMALL (A) 07/18/2021 1223   BILIRUBINUR MODERATE (A) 07/18/2021 1223   KETONESUR NEGATIVE 07/18/2021 1223   PROTEINUR 100 (A) 07/18/2021 1223   UROBILINOGEN 1.0 07/09/2009 2019   NITRITE NEGATIVE 07/18/2021 1223   LEUKOCYTESUR NEGATIVE 07/18/2021 1223    Sepsis Labs: Lactic Acid, Venous    Component Value Date/Time   LATICACIDVEN 2.4 (HH) 07/27/2020 1746    MICROBIOLOGY: Recent Results (from the past 240 hour(s))  Resp Panel by RT-PCR (Flu  A&B, Covid) Nasopharyngeal Swab     Status: None   Collection Time: 07/18/21  4:13 PM   Specimen: Nasopharyngeal Swab; Nasopharyngeal(NP) swabs in vial transport medium  Result Value Ref Range Status   SARS Coronavirus 2 by RT PCR NEGATIVE NEGATIVE Final    Comment: (NOTE) SARS-CoV-2 target nucleic acids are NOT DETECTED.  The SARS-CoV-2 RNA is generally detectable in upper respiratory specimens during the acute phase of infection. The lowest concentration of SARS-CoV-2 viral copies this assay can detect is 138 copies/mL. A negative result does not preclude SARS-Cov-2 infection and should not be used as the sole basis for treatment or other patient management decisions. A negative result may occur with  improper specimen collection/handling, submission of specimen other than nasopharyngeal swab, presence of viral mutation(s) within the areas targeted by this assay, and inadequate number of viral copies(<138 copies/mL). A negative result must be combined with clinical observations, patient history, and epidemiological information. The expected result is Negative.  Fact Sheet for Patients:  EntrepreneurPulse.com.au  Fact Sheet for Healthcare Providers:  IncredibleEmployment.be  This test is no t yet approved or cleared by the [place] FDA and  has been  authorized for detection and/or diagnosis of SARS-CoV-2 by FDA under an Emergency Use Authorization (EUA). This EUA will remain  in effect (meaning this test can be used) for the duration of the COVID-19 declaration under Section 564(b)(1) of the Act, 21 U.S.C.section 360bbb-3(b)(1), unless the authorization is terminated  or revoked sooner.       Influenza A by PCR NEGATIVE NEGATIVE Final   Influenza B by PCR NEGATIVE NEGATIVE Final    Comment: (NOTE) The Xpert Xpress SARS-CoV-2/FLU/RSV plus assay is intended as an aid in the diagnosis of influenza from Nasopharyngeal swab specimens and should not be used as a sole basis for treatment. Nasal washings and aspirates are unacceptable for Xpert Xpress SARS-CoV-2/FLU/RSV testing.  Fact Sheet for Patients: EntrepreneurPulse.com.au  Fact Sheet for Healthcare Providers: IncredibleEmployment.be  This test is not yet approved or cleared by the [place] FDA and has been authorized for detection and/or diagnosis of SARS-CoV-2 by FDA under an Emergency Use Authorization (EUA). This EUA will remain in effect (meaning this test can be used) for the duration of the COVID-19 declaration under Section 564(b)(1) of the Act, 21 U.S.C. section 360bbb-3(b)(1), unless the authorization is terminated or revoked.  Performed at [facility], [place] [address], [place], [place] [address]     RADIOLOGY STUDIES/RESULTS: DG ERCP  Result Date: 07/20/2021 CLINICAL DATA:  Pancreatic mass.  ERCP. EXAM: ERCP TECHNIQUE: Multiple spot images obtained with the fluoroscopic device and submitted for interpretation post-procedure. FLUOROSCOPY TIME:  1 minute, 58 seconds (23.6 mGy) COMPARISON:  CT abdomen and pelvis - 07/18/2021 FINDINGS: Single spot intraoperative fluoroscopic image of the right upper abdominal quadrant is provided for review. There is no definitive opacification the CBD or pancreatic duct. IMPRESSION:  Nondiagnostic ERCP as above. Correlation with the operative report is advised. These images were submitted for radiologic interpretation only. Please see the procedural report for the amount of contrast and the fluoroscopy time utilized. Electronically Signed   By: Sandi Mariscal M.D.   On: 07/20/2021 15:01     LOS: 2 days   Oren Binet, MD  [facility]    To contact the attending provider between 7A-7P or the covering provider during after hours 7P-7A, please log into the web site [email or web address] and access using universal [place] password for that web site. If you  do not have the password, please call the hospital operator.  07/21/2021, 11:46 AM

## 2021-07-21 NOTE — Progress Notes (Signed)
Daily Rounding Note  07/21/2021, 12:00 PM  LOS: 2 days   SUBJECTIVE:   Chief complaint:  jaundice.  nausea   Stools loose and watery.  No signif abd pain.  Tolerating solid food though feels nauseated at times. IR delayed placing perc drain due to INR of 1.7, prefer it be 1.6 or less.  On schedule for tmrw.  IV Vit K ordered along w blood type in case FFP needed to correct INR.    OBJECTIVE:         Vital signs in last 24 hours:    Temp:  [97.5 F (36.4 C)-98.6 F (37 C)] 97.6 F (36.4 C) (12/07 0747) Pulse Rate:  [50-67] 62 (12/07 0747) Resp:  [13-22] 17 (12/07 0747) BP: (118-189)/(64-127) 163/127 (12/07 0747) SpO2:  [94 %-100 %] 95 % (12/07 0747) Weight:  [79.2 kg-80.4 kg] 79.2 kg (12/07 0454) Last BM Date: 07/20/21 Filed Weights   07/20/21 0347 07/20/21 1252 07/21/21 0454  Weight: 80.4 kg 80.4 kg 79.2 kg   General: Jaundiced.  Comfortable.  Looks somewhat frail Heart: RRR. Chest: Clear bilaterally without labored breathing Abdomen: Soft without tenderness.  No distention.  Active bowel sounds Extremities: No CCE Neuro/Psych: Appropriate.  Follows commands.  Hard of hearing.  Intake/Output from previous day: 12/06 0701 - 12/07 0700 In: 600 [I.V.:500; IV Piggyback:100] Out: 900 [Urine:900]  Intake/Output this shift: Total I/O In: -  Out: 700 [Urine:700]  Lab Results: Recent Labs    07/19/21 0431 07/20/21 0049 07/21/21 0114  WBC 5.6 6.7 5.6  HGB 11.0* 12.3* 12.3*  HCT 33.4* 35.3* 36.7*  PLT 172 196 235   BMET Recent Labs    07/19/21 0431 07/20/21 0049 07/21/21 0114  NA 134* 133* 134*  K 3.3* 3.8 4.9  CL 102 100 98  CO2 24 25 26   GLUCOSE 105* 147* 168*  BUN 10 9 13   CREATININE 0.97 1.15 1.23  CALCIUM 8.2* 8.8* 9.1   LFT Recent Labs    07/19/21 0431 07/20/21 0049 07/21/21 0114  PROT 5.8* 6.2* 6.3*  ALBUMIN 2.5* 2.6* 2.5*  AST 65* 72* 91*  ALT 98* 100* 103*  ALKPHOS 315* 344*  415*  BILITOT 12.9* 13.2* 12.9*   PT/INR Recent Labs    07/20/21 0049 07/21/21 0114  LABPROT 22.6* 20.4*  INR 2.0* 1.7*   Hepatitis Panel Recent Labs    07/18/21 1229  HEPBSAG NON REACTIVE  HCVAB NON REACTIVE  HEPAIGM NON REACTIVE  HEPBIGM NON REACTIVE    Studies/Results: DG ERCP  Result Date: 07/20/2021 CLINICAL DATA:  Pancreatic mass.  ERCP. EXAM: ERCP TECHNIQUE: Multiple spot images obtained with the fluoroscopic device and submitted for interpretation post-procedure. FLUOROSCOPY TIME:  1 minute, 58 seconds (23.6 mGy) COMPARISON:  CT abdomen and pelvis - 07/18/2021 FINDINGS: Single spot intraoperative fluoroscopic image of the right upper abdominal quadrant is provided for review. There is no definitive opacification the CBD or pancreatic duct. IMPRESSION: Nondiagnostic ERCP as above. Correlation with the operative report is advised. These images were submitted for radiologic interpretation only. Please see the procedural report for the amount of contrast and the fluoroscopy time utilized. Electronically Signed   By: Sandi Mariscal M.D.   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  Plans for IR to place percutaneous drain delayed  by elevated INR.  Overall rising and or stable LFTs.  Coagulopathy.  INR did improve from 2 to 1.7 after receiving vitamin K 5 mg p.o. yesterday, 3 days of IV vitamin K initiated today.  Hyponatremia, noncritical.  Na 134.  Microcytosis with mild anemia, Hb 12.3   PLAN   Recheck PT/INR in the morning.  Type and screen ordered in case we need to give FFP to correct INR.    Angel Keller  07/21/2021, 12:00 PM Phone [phone]

## 2021-07-22 ENCOUNTER — Inpatient Hospital Stay (HOSPITAL_COMMUNITY): Payer: Medicare HMO

## 2021-07-22 DIAGNOSIS — E039 Hypothyroidism, unspecified: Secondary | ICD-10-CM | POA: Diagnosis not present

## 2021-07-22 DIAGNOSIS — K831 Obstruction of bile duct: Secondary | ICD-10-CM | POA: Diagnosis not present

## 2021-07-22 DIAGNOSIS — I10 Essential (primary) hypertension: Secondary | ICD-10-CM | POA: Diagnosis not present

## 2021-07-22 DIAGNOSIS — F419 Anxiety disorder, unspecified: Secondary | ICD-10-CM | POA: Diagnosis not present

## 2021-07-22 HISTORY — PX: IR INT EXT BILIARY DRAIN WITH CHOLANGIOGRAM: IMG6044

## 2021-07-22 HISTORY — PX: IR ENDOLUMINAL BX OF BILIARY TREE: IMG6053

## 2021-07-22 LAB — COMPREHENSIVE METABOLIC PANEL
ALT: 98 U/L — ABNORMAL HIGH (ref 0–44)
AST: 90 U/L — ABNORMAL HIGH (ref 15–41)
Albumin: 2.5 g/dL — ABNORMAL LOW (ref 3.5–5.0)
Alkaline Phosphatase: 408 U/L — ABNORMAL HIGH (ref 38–126)
Anion gap: 9 (ref 5–15)
BUN: 18 mg/dL (ref 8–23)
CO2: 25 mmol/L (ref 22–32)
Calcium: 8.7 mg/dL — ABNORMAL LOW (ref 8.9–10.3)
Chloride: 99 mmol/L (ref 98–111)
Creatinine, Ser: 1.17 mg/dL (ref 0.61–1.24)
GFR, Estimated: >60 mL/min (ref >60)
Glucose, Bld: 114 mg/dL — ABNORMAL HIGH (ref 70–99)
Potassium: 3.5 mmol/L (ref 3.5–5.1)
Sodium: 133 mmol/L — ABNORMAL LOW (ref 135–145)
Total Bilirubin: 13.5 mg/dL — ABNORMAL HIGH (ref 0.3–1.2)
Total Protein: 5.9 g/dL — ABNORMAL LOW (ref 6.5–8.1)

## 2021-07-22 LAB — PROTIME-INR
INR: 1.2 (ref 0.8–1.2)
Prothrombin Time: 15.5 seconds — ABNORMAL HIGH (ref 11.4–15.2)

## 2021-07-22 MED ORDER — SODIUM CHLORIDE 0.9% FLUSH
5.0000 mL | Freq: Three times a day (TID) | INTRAVENOUS | Status: DC
  Administered 2021-07-22 – 2021-07-23 (×3): 5 mL

## 2021-07-22 MED ORDER — MORPHINE SULFATE (PF) 2 MG/ML IV SOLN
1.0000 mg | INTRAVENOUS | Status: DC | PRN
  Administered 2021-07-22 (×2): 1 mg via INTRAVENOUS
  Filled 2021-07-22 (×2): qty 1

## 2021-07-22 MED ORDER — MIDAZOLAM HCL 2 MG/2ML IJ SOLN
INTRAMUSCULAR | Status: AC | PRN
  Administered 2021-07-22: .5 mg via INTRAVENOUS
  Administered 2021-07-22: 1 mg via INTRAVENOUS
  Administered 2021-07-22: .5 mg via INTRAVENOUS

## 2021-07-22 MED ORDER — LIDOCAINE HCL 1 % IJ SOLN
INTRAMUSCULAR | Status: AC
  Administered 2021-07-22: 4 mL via SUBCUTANEOUS
  Filled 2021-07-22: qty 20

## 2021-07-22 MED ORDER — FENTANYL CITRATE (PF) 100 MCG/2ML IJ SOLN
INTRAMUSCULAR | Status: AC
  Filled 2021-07-22: qty 4

## 2021-07-22 MED ORDER — MIDAZOLAM HCL 2 MG/2ML IJ SOLN
INTRAMUSCULAR | Status: AC
  Filled 2021-07-22: qty 4

## 2021-07-22 MED ORDER — HYDRALAZINE HCL 20 MG/ML IJ SOLN
INTRAMUSCULAR | Status: AC
  Administered 2021-07-22: 5 mg via INTRAVENOUS
  Filled 2021-07-22: qty 1

## 2021-07-22 MED ORDER — FENTANYL CITRATE (PF) 100 MCG/2ML IJ SOLN
INTRAMUSCULAR | Status: AC | PRN
  Administered 2021-07-22: 50 ug via INTRAVENOUS
  Administered 2021-07-22: 25 ug via INTRAVENOUS
  Administered 2021-07-22: 50 ug via INTRAVENOUS

## 2021-07-22 NOTE — Care Management Important Message (Signed)
Important Message  Patient Details  Name: Angel VASQUES Sr. MRN: [id number] Date of Birth: 06-07-1941   Medicare Important Message Given:  Yes     [name] 07/22/2021, 1:56 PM

## 2021-07-22 NOTE — Progress Notes (Signed)
PROGRESS NOTE        PATIENT DETAILS Name: Angel Keller. Age: 80 y.o. Sex: male Date of Birth: March 15, 1941 Admit Date: 07/18/2021 Admitting Physician Aline August, MD ION:GEXBMW, Greer Ee, NP  Brief Narrative: Patient is a 80 y.o. 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  See below for further details.  Subjective: Some mild epigastric discomfort continues.  But no major issues overnight.  Objective: Vitals: Blood pressure (!) 167/81, pulse (!) 52, temperature 97.7 F (36.5 C), temperature source Oral, resp. rate 17, height 5\' 10"  (1.778 m), weight 75.9 kg, SpO2 97 %.   Exam: Gen Exam:Alert awake-not in any distress HEENT:atraumatic, normocephalic Chest: B/L clear to auscultation anteriorly CVS:S1S2 regular Abdomen: Mild upper abdominal discomfort without any peritoneal signs. 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  LFTs slowly downtrending.  Hypothyroidism: Continue Synthroid  HTN: BP relatively stable-continue bisoprolol and HCTZ.  HLD: Hold statins due to elevated LFTs.  Mood disorder: Stable-continue Lexapro  Nutrition Status: Nutrition Problem: Increased nutrient needs Etiology: acute  illness Signs/Symptoms: estimated needs Interventions: MVI  BMI Estimated body mass index is 24.01 kg/m as calculated from the following:   Height as of this encounter: 5\' 10"  (1.778 m).   Weight as of this encounter: 75.9 kg.   Procedures: None Consults: GI, IR DVT Prophylaxis: SCDs Code Status:Full code  Family Communication: None at bedside  Time spent: 25- minutes-Greater than 50% of this time was spent in counseling, explanation of diagnosis, planning of further management, and coordination of care.   Disposition Plan: Status is: Inpatient  Remains inpatient appropriate because: Obstructive jaundice due to pancreatic mass-failed ERCP-for percutaneous biliary drain placement by IR on 12/8.    Diet: Diet Order             Diet NPO time specified Except for: Sips with Meds  Diet effective midnight                     Antimicrobial agents: Anti-infectives (From admission, onward)    Start     Dose/Rate Route Frequency Ordered Stop   07/22/21 0600  cefOXitin (MEFOXIN) 2 g in sodium chloride 0.9 % 100 mL IVPB        2 g 200 mL/hr over 30 Minutes Intravenous On call 07/21/21 0936 07/23/21 0600   07/21/21 0600  cefTRIAXone (ROCEPHIN) 1 g in sodium chloride 0.9 % 100 mL IVPB        1 g 200 mL/hr over 30 Minutes Intravenous  Once 07/20/21 1651     07/20/21 1245  Ampicillin-Sulbactam (UNASYN)  3 g in sodium chloride 0.9 % 100 mL IVPB        3 g 200 mL/hr over 30 Minutes Intravenous  Once 07/20/21 1240 07/20/21 1439        MEDICATIONS: Scheduled Meds:  bisoprolol-hydrochlorothiazide  1 tablet Oral Daily   escitalopram  20 mg Oral Daily   famotidine  20 mg Oral Daily   indomethacin  100 mg Rectal Once   levothyroxine  112 mcg Oral Daily   pantoprazole  40 mg Oral Daily   sodium chloride flush  3 mL Intravenous Q12H   Continuous Infusions:  cefOXitin Stopped (07/22/21 1004)   cefTRIAXone (ROCEPHIN)  IV     phytonadione (VITAMIN K) IV 5 mg (07/22/21 1000)    PRN Meds:.acetaminophen **OR** acetaminophen, hydrALAZINE, ondansetron (ZOFRAN) IV, polyethylene glycol   I have personally reviewed following labs and imaging studies  LABORATORY DATA: CBC: Recent Labs  Lab 07/18/21 1225 07/19/21 0431 07/20/21 0049 07/21/21 0114  WBC 8.6 5.6 6.7 5.6  NEUTROABS 5.9  --  4.6  --   HGB 12.3* 11.0* 12.3* 12.3*  HCT 37.3* 33.4* 35.3* 36.7*  MCV 81.6 82.1 79.7* 79.4*  PLT 214 172 196 235     Basic Metabolic Panel: Recent Labs  Lab 07/18/21 1225 07/19/21 0431 07/20/21 0049 07/21/21 0114 07/22/21 0157  NA 133* 134* 133* 134* 133*  K 4.0 3.3* 3.8 4.9 3.5  CL 100 102 100 98 99  CO2 25 24 25 26 25   GLUCOSE 156* 105* 147* 168* 114*  BUN 14 10 9 13 18   CREATININE 1.27* 0.97 1.15 1.23 1.17  CALCIUM 9.2 8.2* 8.8* 9.1 8.7*  MG  --   --  2.1 2.4  --      GFR: Estimated Creatinine Clearance: 51.1 mL/min (by C-G formula based on SCr of 1.17 mg/dL).  Liver Function Tests: Recent Labs  Lab 07/18/21 1225 07/19/21 0431 07/20/21 0049 07/21/21 0114 07/22/21 0157  AST 92* 65* 72* 91* 90*  ALT 134* 98* 100* 103* 98*  ALKPHOS 385* 315* 344* 415* 408*  BILITOT 15.9* 12.9* 13.2* 12.9* 13.5*  PROT 7.0 5.8* 6.2* 6.3* 5.9*  ALBUMIN 3.2* 2.5* 2.6* 2.5* 2.5*    Recent Labs  Lab 07/18/21 1225  LIPASE 69*    No results for input(s): AMMONIA in the last 168 hours.  Coagulation Profile: Recent Labs  Lab 07/19/21 1007 07/20/21 0049 07/21/21 0114 07/22/21 0157  INR 2.0* 2.0* 1.7* 1.2     Cardiac Enzymes: No results for input(s): CKTOTAL, CKMB, CKMBINDEX, TROPONINI in the last 168 hours.  BNP (last 3 results) No results for input(s): PROBNP in the last 8760 hours.  Lipid Profile: No results for input(s): CHOL, HDL, LDLCALC, TRIG, CHOLHDL, LDLDIRECT in the last 72 hours.  Thyroid Function Tests: No results for input(s): TSH, T4TOTAL, FREET4, T3FREE, THYROIDAB in the last 72 hours.  Anemia Panel: No results for input(s):  VITAMINB12, FOLATE, FERRITIN, TIBC, IRON, RETICCTPCT in the last 72 hours.  Urine analysis:    Component Value Date/Time   COLORURINE AMBER (A) 07/18/2021 1223   APPEARANCEUR CLEAR 07/18/2021 1223   LABSPEC 1.026 07/18/2021 1223   PHURINE 5.0 07/18/2021 1223   GLUCOSEU NEGATIVE 07/18/2021 1223   GLUCOSEU NEGATIVE 11/07/2008 0000   HGBUR SMALL (A) 07/18/2021 1223   BILIRUBINUR MODERATE (A) 07/18/2021 1223   KETONESUR NEGATIVE 07/18/2021 1223   PROTEINUR 100 (A) 07/18/2021 1223   UROBILINOGEN 1.0 07/09/2009 2019   NITRITE NEGATIVE 07/18/2021 1223   LEUKOCYTESUR NEGATIVE 07/18/2021 1223  Sepsis Labs: Lactic Acid, Venous    Component Value Date/Time   LATICACIDVEN 2.4 (HH) 07/27/2020 1746    MICROBIOLOGY: Recent Results (from the past 240 hour(s))  Resp Panel by RT-PCR (Flu A&B, Covid) Nasopharyngeal Swab     Status: None   Collection Time: 07/18/21  4:13 PM   Specimen: Nasopharyngeal Swab; Nasopharyngeal(NP) swabs in vial transport medium  Result Value Ref Range Status   SARS Coronavirus 2 by RT PCR NEGATIVE NEGATIVE Final    Comment: (NOTE) SARS-CoV-2 target nucleic acids are NOT DETECTED.  The SARS-CoV-2 RNA is generally detectable in upper respiratory specimens during the acute phase of infection. The lowest concentration of SARS-CoV-2 viral copies this assay can detect is 138 copies/mL. A negative result does not preclude SARS-Cov-2 infection and should not be used as the sole basis for treatment or other patient management decisions. A negative result may occur with  improper specimen collection/handling, submission of specimen other than nasopharyngeal swab, presence of viral mutation(s) within the areas targeted by this assay, and inadequate number of viral copies(<138 copies/mL). A negative result must be combined with clinical observations, patient history, and epidemiological information. The expected result is Negative.  Fact Sheet for Patients:   EntrepreneurPulse.com.au  Fact Sheet for Healthcare Providers:  IncredibleEmployment.be  This test is no t yet approved or cleared by the [place] FDA and  has been authorized for detection and/or diagnosis of SARS-CoV-2 by FDA under an Emergency Use Authorization (EUA). This EUA will remain  in effect (meaning this test can be used) for the duration of the COVID-19 declaration under Section 564(b)(1) of the Act, 21 U.S.C.section 360bbb-3(b)(1), unless the authorization is terminated  or revoked sooner.       Influenza A by PCR NEGATIVE NEGATIVE Final   Influenza B by PCR NEGATIVE NEGATIVE Final    Comment: (NOTE) The Xpert Xpress SARS-CoV-2/FLU/RSV plus assay is intended as an aid in the diagnosis of influenza from Nasopharyngeal swab specimens and should not be used as a sole basis for treatment. Nasal washings and aspirates are unacceptable for Xpert Xpress SARS-CoV-2/FLU/RSV testing.  Fact Sheet for Patients: EntrepreneurPulse.com.au  Fact Sheet for Healthcare Providers: IncredibleEmployment.be  This test is not yet approved or cleared by the [place] FDA and has been authorized for detection and/or diagnosis of SARS-CoV-2 by FDA under an Emergency Use Authorization (EUA). This EUA will remain in effect (meaning this test can be used) for the duration of the COVID-19 declaration under Section 564(b)(1) of the Act, 21 U.S.C. section 360bbb-3(b)(1), unless the authorization is terminated or revoked.  Performed at [facility], [place] [address] [place], [place] [address]     RADIOLOGY STUDIES/RESULTS: DG ERCP  Result Date: 07/20/2021 CLINICAL DATA:  Pancreatic mass.  ERCP. EXAM: ERCP TECHNIQUE: Multiple spot images obtained with the fluoroscopic device and submitted for interpretation post-procedure. FLUOROSCOPY TIME:  1 minute, 58 seconds (23.6 mGy) COMPARISON:  CT abdomen and pelvis  - 07/18/2021 FINDINGS: Single spot intraoperative fluoroscopic image of the right upper abdominal quadrant is provided for review. There is no definitive opacification the CBD or pancreatic duct. IMPRESSION: Nondiagnostic ERCP as above. Correlation with the operative report is advised. These images were submitted for radiologic interpretation only. Please see the procedural report for the amount of contrast and the fluoroscopy time utilized. Electronically Signed   By: Sandi Mariscal M.D.   On: 07/20/2021 15:01     LOS: 3 days   Oren Binet, MD  [facility]  To contact the attending provider between 7A-7P or the covering provider during after hours 7P-7A, please log into the web site [email or web address] and access using universal [place] password for that web site. If you do not have the password, please call the hospital operator.  07/22/2021, 10:55 AM

## 2021-07-22 NOTE — Procedures (Signed)
Interventional Radiology Procedure Note  Procedure:  1.) Percutaneous transhepatic cholangiogram 2.) Transhepatic brush biopsy x3 3.) Placement of 39F int/ext biliary drain  Complications: None  Estimated Blood Loss: None  Recommendations: - Path sent - Drain to bag until Br trending down, can then cap   Signed,  Criselda Peaches, MD

## 2021-07-23 DIAGNOSIS — I10 Essential (primary) hypertension: Secondary | ICD-10-CM | POA: Diagnosis not present

## 2021-07-23 DIAGNOSIS — E039 Hypothyroidism, unspecified: Secondary | ICD-10-CM | POA: Diagnosis not present

## 2021-07-23 DIAGNOSIS — K831 Obstruction of bile duct: Secondary | ICD-10-CM | POA: Diagnosis not present

## 2021-07-23 LAB — COMPREHENSIVE METABOLIC PANEL
ALT: 99 U/L — ABNORMAL HIGH (ref 0–44)
AST: 82 U/L — ABNORMAL HIGH (ref 15–41)
Albumin: 2.6 g/dL — ABNORMAL LOW (ref 3.5–5.0)
Alkaline Phosphatase: 460 U/L — ABNORMAL HIGH (ref 38–126)
Anion gap: 11 (ref 5–15)
BUN: 18 mg/dL (ref 8–23)
CO2: 26 mmol/L (ref 22–32)
Calcium: 8.7 mg/dL — ABNORMAL LOW (ref 8.9–10.3)
Chloride: 96 mmol/L — ABNORMAL LOW (ref 98–111)
Creatinine, Ser: 1.13 mg/dL (ref 0.61–1.24)
GFR, Estimated: >60 mL/min (ref >60)
Glucose, Bld: 157 mg/dL — ABNORMAL HIGH (ref 70–99)
Potassium: 3.3 mmol/L — ABNORMAL LOW (ref 3.5–5.1)
Sodium: 133 mmol/L — ABNORMAL LOW (ref 135–145)
Total Bilirubin: 10.1 mg/dL — ABNORMAL HIGH (ref 0.3–1.2)
Total Protein: 6.4 g/dL — ABNORMAL LOW (ref 6.5–8.1)

## 2021-07-23 MED ORDER — POTASSIUM CHLORIDE CRYS ER 20 MEQ PO TBCR
40.0000 meq | EXTENDED_RELEASE_TABLET | Freq: Once | ORAL | Status: AC
  Administered 2021-07-23: 40 meq via ORAL
  Filled 2021-07-23: qty 2

## 2021-07-23 MED ORDER — OXYCODONE HCL 5 MG PO TABS: 5.0000 mg | ORAL_TABLET | Freq: Four times a day (QID) | ORAL | 0 refills | Status: DC | PRN

## 2021-07-23 MED ORDER — OXYCODONE HCL 5 MG PO TABS
5.0000 mg | ORAL_TABLET | Freq: Four times a day (QID) | ORAL | Status: DC | PRN
  Administered 2021-07-23: 5 mg via ORAL
  Filled 2021-07-23: qty 1

## 2021-07-23 NOTE — Progress Notes (Signed)
[facility] Progress Note  CC:  Jaundice, pancreas mass  Subjective:  Feels ok.  Pain in RUQ where PTC placed and some epigastric discomfort.  Objective:  Vital signs in last 24 hours: Temp:  [97.7 F (36.5 C)-98.6 F (37 C)] 97.7 F (36.5 C) (12/09 0725) Pulse Rate:  [52-85] 58 (12/09 0725) Resp:  [15-22] 15 (12/09 0725) BP: (114-206)/(70-95) 155/75 (12/09 0725) SpO2:  [91 %-100 %] 91 % (12/09 0725) Weight:  [79.5 kg] 79.5 kg (12/09 0500) Last BM Date: 07/22/21 General:  Alert, in NAD; jaundice noted. Heart:  Regular rate and rhythm; no murmurs Pulm:  CTAB.  No W/R/R. Abdomen:  Soft, non-distended.  BS present.  Mild RUQ and epigastric TTP. Extremities:  Without edema. Neurologic:  Alert and oriented x 4;  grossly normal neurologically. Psych:  Alert and cooperative. Normal mood and affect.  Intake/Output from previous day: 12/08 0701 - 12/09 0700 In: 200 [IV Piggyback:200] Out: 1960 [Urine:1350; Drains:610] Intake/Output this shift: Total I/O In: -  Out: 200 [Urine:200]  Lab Results: Recent Labs    07/21/21 0114  WBC 5.6  HGB 12.3*  HCT 36.7*  PLT 235   BMET Recent Labs    07/21/21 0114 07/22/21 0157 07/23/21 0253  NA 134* 133* 133*  K 4.9 3.5 3.3*  CL 98 99 96*  CO2 '26 25 26  ' GLUCOSE 168* 114* 157*  BUN '13 18 18  ' CREATININE 1.23 1.17 1.13  CALCIUM 9.1 8.7* 8.7*   LFT Recent Labs    07/23/21 0253  PROT 6.4*  ALBUMIN 2.6*  AST 82*  ALT 99*  ALKPHOS 460*  BILITOT 10.1*   PT/INR Recent Labs    07/21/21 0114 07/22/21 0157  LABPROT 20.4* 15.5*  INR 1.7* 1.2   IR INT EXT BILIARY DRAIN WITH CHOLANGIOGRAM  Result Date: 07/22/2021 INDICATION: 80 year old male with obstructed jaundice and 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 He presents for percutaneous transhepatic cholangiogram, percutaneous biliary drain placement and brush biopsy if possible. EXAM: IR ENDOLUMINAL BIOPSY OF BILIARY TREE; IR INT-EXT BILIARY DRAIN W/  CHOLANGIOGRAM MEDICATIONS: In patient currently on antibiotics. ANESTHESIA/SEDATION: Moderate (conscious) sedation was employed during this procedure. A total of Versed 2 mg and Fentanyl 150 mcg was administered intravenously. Moderate Sedation Time: 33 minutes. The patient's level of consciousness and vital signs were monitored continuously by radiology nursing throughout the procedure under my direct supervision. FLUOROSCOPY TIME:  Fluoroscopy Time: 8 minutes 6 seconds (68 mGy). COMPLICATIONS: None immediate. PROCEDURE: Informed written consent was obtained from the patient. After a thorough discussion of the procedural risks, benefits and alternatives. All questions were addressed. Maximal Sterile Barrier Technique was utilized including caps, mask, sterile gowns, sterile gloves, sterile drape, hand hygiene and skin antiseptic. A timeout was performed prior to the initiation of the procedure. the liver was interrogated with ultrasound. Intrahepatic biliary ductal dilatation is visualized. A suitable peripheral radicle in the right hemi liver was identified. Local anesthesia was attained by infiltration with 1% lidocaine. A small dermatotomy was made. Under real-time ultrasound guidance, a 21 gauge trocar needle was used to puncture the peripheral biliary radicle. A transhepatic percutaneous cholangiogram was then performed confirming good access of the biliary tree. There is marked intra and extrahepatic biliary ductal dilatation. The cystic duct is patent. Contrast opacifies the gallbladder. Both the right and left ducts are opacified. There is complete occlusion of the common bile duct distally. A 0.018 wire was advanced into the common bile duct. The soft tissue tract was dilated utilizing  the transitional sheath. An angled 4 French glide catheter and roadrunner wire were then advanced coaxially and used to successfully navigate through the obstructed distal common bile duct and into the duodenum. The road  runner wire was exchanged for an Amplatz wire. The transitional sheath was exchanged for an 8 [place] Brite tip sheath which was advanced into the distal common bile duct just proximal to the stenosis. Brush biopsy was then performed 3 times. Specimens were placed in both formalin and saline and delivered to pathology for further analysis. The 8 French sheath was removed. A 10 French internal/external biliary drainage catheter was then advanced over the wire and formed with the locking loop in the duodenum. Contrast injection confirms excellent placement and adequate drainage. The catheter was connected to gravity bag drainage and secured to the skin with 0 Prolene suture. The patient tolerated the procedure well. IMPRESSION: 1. 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 2. Successful brush biopsy of obstructing lesion x3. 3. Successful placement of a 10 French internal/external biliary drainage catheter. PLAN: 1. Follow bilirubin. When significant downward trend is clear, the bag should be capped. Recommend capping bag before discharge home if possible. 2. Follow-up in IR in 4-6 weeks for initial biliary tube check and exchange. If initial brush biopsies are negative, repeat biopsy could be considered at that time. Signed, Criselda Peaches, MD, [facility] Radiology Electronically Signed   By: Jacqulynn Cadet M.D.   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 He presents for percutaneous transhepatic cholangiogram, percutaneous biliary drain placement and brush biopsy if possible. EXAM: IR ENDOLUMINAL BIOPSY OF BILIARY TREE; IR INT-EXT BILIARY DRAIN W/ CHOLANGIOGRAM MEDICATIONS: In patient currently on antibiotics. ANESTHESIA/SEDATION: Moderate (conscious)  sedation was employed during this procedure. A total of Versed 2 mg and Fentanyl 150 mcg was administered intravenously. Moderate Sedation Time: 33 minutes. The patient's level of consciousness and vital signs were monitored continuously by radiology nursing throughout the procedure under my direct supervision. FLUOROSCOPY TIME:  Fluoroscopy Time: 8 minutes 6 seconds (68 mGy). COMPLICATIONS: None immediate. PROCEDURE: Informed written consent was obtained from the patient. After a thorough discussion of the procedural risks, benefits and alternatives. All questions were addressed. Maximal Sterile Barrier Technique was utilized including caps, mask, sterile gowns, sterile gloves, sterile drape, hand hygiene and skin antiseptic. A timeout was performed prior to the initiation of the procedure. the liver was interrogated with ultrasound. Intrahepatic biliary ductal dilatation is visualized. A suitable peripheral radicle in the right hemi liver was identified. Local anesthesia was attained by infiltration with 1% lidocaine. A small dermatotomy was made. Under real-time ultrasound guidance, a 21 gauge trocar needle was used to puncture the peripheral biliary radicle. A transhepatic percutaneous cholangiogram was then performed confirming good access of the biliary tree. There is marked intra and extrahepatic biliary ductal dilatation. The cystic duct is patent. Contrast opacifies the gallbladder. Both the right and left ducts are opacified. There is complete occlusion of the common bile duct distally. A 0.018 wire was advanced into the common bile duct. The soft tissue tract was dilated utilizing the transitional sheath. An angled 4 French glide catheter and roadrunner wire were then advanced coaxially and used to successfully navigate through the obstructed distal common bile duct and into the duodenum. The road runner wire was exchanged for an Amplatz wire. The  transitional sheath was exchanged for an 8 [place] Brite  tip sheath which was advanced into the distal common bile duct just proximal to the stenosis. Brush biopsy was then performed 3 times. Specimens were placed in both formalin and saline and delivered to pathology for further analysis. The 8 French sheath was removed. A 10 French internal/external biliary drainage catheter was then advanced over the wire and formed with the locking loop in the duodenum. Contrast injection confirms excellent placement and adequate drainage. The catheter was connected to gravity bag drainage and secured to the skin with 0 Prolene suture. The patient tolerated the procedure well. IMPRESSION: 1. 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 2. Successful brush biopsy of obstructing lesion x3. 3. Successful placement of a 10 French internal/external biliary drainage catheter. PLAN: 1. Follow bilirubin. When significant downward trend is clear, the bag should be capped. Recommend capping bag before discharge home if possible. 2. Follow-up in IR in 4-6 weeks for initial biliary tube check and exchange. If initial brush biopsies are negative, repeat biopsy could be considered at that time. Signed, Criselda Peaches, MD, [facility] Radiology Electronically Signed   By: Jacqulynn Cadet M.D.   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  Plans for IR to place percutaneous drain delayed by elevated INR.  PTC finally placed on 12/8.  Total bili down-trending.   Coagulopathy.  INR did improve from 2 to 1.7 after receiving vitamin K 5 mg p.o. yesterday, 3 days of IV vitamin K initiated 12/7.   Hyponatremia, noncritical.  Na 133 today.   Microcytosis with mild anemia, Hb 12.3 grams on 12/7.  -IR  to follow-up with the patient for drain management. -Dr. Rush Landmark is aware of the patient and will likely arrange for EUS as outpatient.   LOS: 4 days   Laban Emperor. Dynastie Knoop  07/23/2021, 11:06 AM

## 2021-07-23 NOTE — Progress Notes (Signed)
Patient discharging home. Vital signs stable at time of discharge as reflected in discharge summary. Discharge instructions given and verbal understanding returned. Patient discharging with drainage bag. No questions at this time.

## 2021-07-23 NOTE — TOC Transition Note (Signed)
Transition of Care [facility]) - CM/SW Discharge Note   Patient Details  Name: Angel MINUS Sr. MRN: [id number] Date of Birth: [birth year]  [facility] CM/SW Contact:  Verdell Carmine, RN Phone Number: 07/23/2021, 1:05 PM   Clinical Narrative:     IR was in room giving patient instructions about the drain. He will DC tonight or tomorrow. He states he has no needs for DME. He feels he has had good instruction. And knows what to do with T tube.   Final next level of care: Home/Self Care Barriers to Discharge: No Barriers Identified   Patient Goals and CMS Choice        Discharge Placement             Home self- care          Discharge Plan and Services   Discharge Planning Services: CM Consult                                 Social Determinants of Health (SDOH) Interventions     Readmission Risk Interventions No flowsheet data found.

## 2021-07-23 NOTE — Progress Notes (Signed)
Referring Physician(s): Dr. Carlean Purl  Supervising Physician: Mir, Sharen Heck  Patient Status:  [facility] - In-pt  Chief Complaint: Abdominal pain/obstructive jaundice/pancreatic mass s/p percutaneous transhepatic cholangiogram, transhepatic brush biopsy x3 and placement of a 10 Fr internal/external biliary drain.   Subjective: Patient awake/alert and sitting up in bed finishing his lunch. He states he is feeling better today. Approximately 200 ml of dark green bile in gravity bag.   Allergies: Patient has no known allergies.  Medications: Prior to Admission medications   Medication Sig Start Date End Date Taking? Authorizing Provider  aspirin EC 81 MG tablet Take 1 tablet (81 mg total) by mouth daily. Hold while on blood thinner/eliquis for 3 months, then resume Patient taking differently: Take 81 mg by mouth daily. 07/30/20  Yes Rai, Ripudeep K, MD  bisoprolol-hydrochlorothiazide (ZIAC) 2.5-6.25 MG tablet Take 1 tablet by mouth every morning. 06/20/15  Yes [provider]  Cholecalciferol (VITAMIN D3) 50 MCG (2000 UT) TABS Take 2,000 Units by mouth daily.   Yes [provider]  escitalopram (LEXAPRO) 20 MG tablet Take 20 mg by mouth daily. 12/22/18  Yes [provider]  lovastatin (MEVACOR) 20 MG tablet Take 20 mg by mouth at bedtime. 07/25/15  Yes [provider]  pantoprazole (PROTONIX) 40 MG tablet Take 1 tablet (40 mg total) by mouth daily. 07/12/21  Yes Ronnell Freshwater, NP  SYNTHROID 112 MCG tablet Take 112 mcg by mouth daily before breakfast. 07/02/20  Yes [provider]  vitamin B-12 (CYANOCOBALAMIN) 1000 MCG tablet Take 2,000 mcg by mouth daily.   Yes [provider]  allopurinol (ZYLOPRIM) 300 MG tablet Take 300 mg by mouth daily. Patient not taking: Reported on 07/18/2021 02/04/20   [provider]  amoxicillin (AMOXIL) 875 MG tablet Take 1 tablet (875 mg total) by mouth 2 (two) times daily. Patient not taking: Reported  on 07/18/2021 07/02/21   Ronnell Freshwater, NP     Vital Signs: BP (!) 155/75 (BP Location: Left Arm)   Pulse (!) 58   Temp 97.7 F (36.5 C) (Oral)   Resp 15   Ht 5\' 10"  (1.778 m)   Wt 175 lb 4.3 oz (79.5 kg)   SpO2 91%   BMI 25.15 kg/m   Physical Exam Constitutional:      General: He is not in acute distress.    Appearance: He is underweight. He is not ill-appearing.  Abdominal:     Comments: RUQ drain to gravity. Approximately 200 ml of dark green bile in gravity bag. Dressing is clean and dry.   Skin:    General: Skin is warm and dry.     Coloration: Skin is jaundiced.  Neurological:     Mental Status: He is alert and oriented to person, place, and time.    Imaging: DG ERCP  Result Date: 07/20/2021 CLINICAL DATA:  Pancreatic mass.  ERCP. EXAM: ERCP TECHNIQUE: Multiple spot images obtained with the fluoroscopic device and submitted for interpretation post-procedure. FLUOROSCOPY TIME:  1 minute, 58 seconds (23.6 mGy) COMPARISON:  CT abdomen and pelvis - 07/18/2021 FINDINGS: Single spot intraoperative fluoroscopic image of the right upper abdominal quadrant is provided for review. There is no definitive opacification the CBD or pancreatic duct. IMPRESSION: Nondiagnostic ERCP as above. Correlation with the operative report is advised. These images were submitted for radiologic interpretation only. Please see the procedural report for the amount of contrast and the fluoroscopy time utilized. Electronically Signed   By: Eldridge Abrahams.D.  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 He presents for percutaneous transhepatic cholangiogram, percutaneous biliary drain placement and brush biopsy if possible. EXAM: IR ENDOLUMINAL BIOPSY OF BILIARY TREE; IR INT-EXT BILIARY DRAIN W/ CHOLANGIOGRAM MEDICATIONS: In patient currently on antibiotics.  ANESTHESIA/SEDATION: Moderate (conscious) sedation was employed during this procedure. A total of Versed 2 mg and Fentanyl 150 mcg was administered intravenously. Moderate Sedation Time: 33 minutes. The patient's level of consciousness and vital signs were monitored continuously by radiology nursing throughout the procedure under my direct supervision. FLUOROSCOPY TIME:  Fluoroscopy Time: 8 minutes 6 seconds (68 mGy). COMPLICATIONS: None immediate. PROCEDURE: Informed written consent was obtained from the patient. After a thorough discussion of the procedural risks, benefits and alternatives. All questions were addressed. Maximal Sterile Barrier Technique was utilized including caps, mask, sterile gowns, sterile gloves, sterile drape, hand hygiene and skin antiseptic. A timeout was performed prior to the initiation of the procedure. the liver was interrogated with ultrasound. Intrahepatic biliary ductal dilatation is visualized. A suitable peripheral radicle in the right hemi liver was identified. Local anesthesia was attained by infiltration with 1% lidocaine. A small dermatotomy was made. Under real-time ultrasound guidance, a 21 gauge trocar needle was used to puncture the peripheral biliary radicle. A transhepatic percutaneous cholangiogram was then performed confirming good access of the biliary tree. There is marked intra and extrahepatic biliary ductal dilatation. The cystic duct is patent. Contrast opacifies the gallbladder. Both the right and left ducts are opacified. There is complete occlusion of the common bile duct distally. A 0.018 wire was advanced into the common bile duct. The soft tissue tract was dilated utilizing the transitional sheath. An angled 4 French glide catheter and roadrunner wire were then advanced coaxially and used to successfully navigate through the obstructed distal common bile duct and into the duodenum. The road runner wire was exchanged for an Amplatz wire. The transitional  sheath was exchanged for an 8 [place] Brite tip sheath which was advanced into the distal common bile duct just proximal to the stenosis. Brush biopsy was then performed 3 times. Specimens were placed in both formalin and saline and delivered to pathology for further analysis. The 8 French sheath was removed. A 10 French internal/external biliary drainage catheter was then advanced over the wire and formed with the locking loop in the duodenum. Contrast injection confirms excellent placement and adequate drainage. The catheter was connected to gravity bag drainage and secured to the skin with 0 Prolene suture. The patient tolerated the procedure well. IMPRESSION: 1. 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 2. Successful brush biopsy of obstructing lesion x3. 3. Successful placement of a 10 French internal/external biliary drainage catheter. PLAN: 1. Follow bilirubin. When significant downward trend is clear, the bag should be capped. Recommend capping bag before discharge home if possible. 2. Follow-up in IR in 4-6 weeks for initial biliary tube check and exchange. If initial brush biopsies are negative, repeat biopsy could be considered at that time. Signed, Criselda Peaches, MD, [facility] Radiology Electronically Signed   By: Jacqulynn Cadet M.D.   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 He presents for percutaneous transhepatic cholangiogram, percutaneous biliary drain placement and brush biopsy if possible. EXAM:  IR ENDOLUMINAL BIOPSY OF BILIARY TREE; IR INT-EXT BILIARY DRAIN W/ CHOLANGIOGRAM MEDICATIONS: In patient currently on antibiotics. ANESTHESIA/SEDATION: Moderate (conscious) sedation was employed during this procedure. A total of Versed 2 mg  and Fentanyl 150 mcg was administered intravenously. Moderate Sedation Time: 33 minutes. The patient's level of consciousness and vital signs were monitored continuously by radiology nursing throughout the procedure under my direct supervision. FLUOROSCOPY TIME:  Fluoroscopy Time: 8 minutes 6 seconds (68 mGy). COMPLICATIONS: None immediate. PROCEDURE: Informed written consent was obtained from the patient. After a thorough discussion of the procedural risks, benefits and alternatives. All questions were addressed. Maximal Sterile Barrier Technique was utilized including caps, mask, sterile gowns, sterile gloves, sterile drape, hand hygiene and skin antiseptic. A timeout was performed prior to the initiation of the procedure. the liver was interrogated with ultrasound. Intrahepatic biliary ductal dilatation is visualized. A suitable peripheral radicle in the right hemi liver was identified. Local anesthesia was attained by infiltration with 1% lidocaine. A small dermatotomy was made. Under real-time ultrasound guidance, a 21 gauge trocar needle was used to puncture the peripheral biliary radicle. A transhepatic percutaneous cholangiogram was then performed confirming good access of the biliary tree. There is marked intra and extrahepatic biliary ductal dilatation. The cystic duct is patent. Contrast opacifies the gallbladder. Both the right and left ducts are opacified. There is complete occlusion of the common bile duct distally. A 0.018 wire was advanced into the common bile duct. The soft tissue tract was dilated utilizing the transitional sheath. An angled 4 French glide catheter and roadrunner wire were then advanced coaxially and used to successfully navigate through the obstructed distal common bile duct and into the duodenum. The road runner wire was exchanged for an Amplatz wire. The transitional sheath was exchanged for an 8 [place] Brite tip sheath which was advanced into the distal common bile duct just  proximal to the stenosis. Brush biopsy was then performed 3 times. Specimens were placed in both formalin and saline and delivered to pathology for further analysis. The 8 French sheath was removed. A 10 French internal/external biliary drainage catheter was then advanced over the wire and formed with the locking loop in the duodenum. Contrast injection confirms excellent placement and adequate drainage. The catheter was connected to gravity bag drainage and secured to the skin with 0 Prolene suture. The patient tolerated the procedure well. IMPRESSION: 1. 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 2. Successful brush biopsy of obstructing lesion x3. 3. Successful placement of a 10 French internal/external biliary drainage catheter. PLAN: 1. Follow bilirubin. When significant downward trend is clear, the bag should be capped. Recommend capping bag before discharge home if possible. 2. Follow-up in IR in 4-6 weeks for initial biliary tube check and exchange. If initial brush biopsies are negative, repeat biopsy could be considered at that time. Signed, Criselda Peaches, MD, [facility] Radiology Electronically Signed   By: Jacqulynn Cadet M.D.   On: 07/22/2021 17:16    Labs:  CBC: Recent Labs    07/18/21 1225 07/19/21 0431 07/20/21 0049 07/21/21 0114  WBC 8.6 5.6 6.7 5.6  HGB 12.3* 11.0* 12.3* 12.3*  HCT 37.3* 33.4* 35.3* 36.7*  PLT 214 172 196 235    COAGS: Recent Labs    07/19/21 1007 07/20/21 0049 07/21/21 0114 07/22/21 0157  INR 2.0* 2.0* 1.7* 1.2    BMP: Recent Labs    07/20/21 0049 07/21/21 0114 07/22/21 0157 07/23/21  0253  NA 133* 134* 133* 133*  K 3.8 4.9 3.5 3.3*  CL 100 98 99 96*  CO2 25 26 25 26   GLUCOSE 147* 168* 114* 157*  BUN 9 13 18 18   CALCIUM 8.8* 9.1 8.7* 8.7*  CREATININE 1.15 1.23 1.17 1.13  GFRNONAA >60 59* >60 >60    LIVER FUNCTION  TESTS: Recent Labs    07/20/21 0049 07/21/21 0114 07/22/21 0157 07/23/21 0253  BILITOT 13.2* 12.9* 13.5* 10.1*  AST 72* 91* 90* 82*  ALT 100* 103* 98* 99*  ALKPHOS 344* 415* 408* 460*  PROT 6.2* 6.3* 5.9* 6.4*  ALBUMIN 2.6* 2.5* 2.5* 2.6*    Assessment and Plan:  Abdominal pain/obstructive jaundice/pancreatic mass s/p percutaneous transhepatic cholangiogram, transhepatic brush biopsy x3 and placement of a 10 Fr internal/external biliary drain.   Drain Location: RUQ Size: Fr size: 10 Fr Date of placement: 07/22/21 Currently to: Drain collection device: gravity 24 hour output:  Output by Drain (mL) 07/21/21 0701 - 07/21/21 1900 07/21/21 1901 - 07/22/21 0700 07/22/21 0701 - 07/22/21 1900 07/22/21 1901 - 07/23/21 0700 07/23/21 0701 - 07/23/21 1107  Biliary Tube Cook slip-coat 10 Fr. RLQ   60 550 200 ml    Interval imaging/drain manipulation:  None  Current examination: Insertion site unremarkable. Dressed appropriately.  Approximately 200 ml dark green bile in gravity bag.   Plan:  Patient has pending discharge plans for this afternoon. Bilirubin level is down to 10.1 (13.5 yesterday). IR has capped the drain and a gravity bag was given to the patient with instructions to reconnect the drain to the gravity bag if he develops abdominal pain or leaking around the drain site. He has been instructed to call IR if this happens so that he can receive further instructions.   The drain site must remain clean/dry. Ok to shower as long as an occlusive dressing is placed over the site. The patient was instructed to change the dressing daily or as needed.   This information was shared with his bedside nurse Claiborne Billings. I called the patient's daughter Lattie Haw to also share these discharge instructions and left a voice mail message requesting a call back. She has the number to the IR APP office at [facility].   If the patient tolerates the drain being capped IR will plan to see him in approximately 4  weeks for a drain evaluation, to check labs including bilirubin level and perform possible repeat brush biopsy depending on the results of the samples obtained yesterday.   A scheduler from our office will call the patient with a date/time of his follow up appointment.    Electronically Signed: Soyla Dryer, AGACNP-BC [phone] 07/23/2021, 10:47 AM   I spent a total of 25 Minutes at the the patient's bedside AND on the patient's hospital floor or unit, greater than 50% of which was counseling/coordinating care for internal/external biliary drain.

## 2021-07-23 NOTE — TOC Initial Note (Signed)
Transition of Care (TOC) - Initial/Assessment Note    Patient Details  Name: Angel WOOLEVER Sr. MRN: [id number] Date of Birth: 21-Dec-1940  Transition of Care [facility]) CM/SW Contact:    Verdell Carmine, RN Phone Number: 07/23/2021, 11:33 AM  Clinical Narrative:                 80 year old patient presented with jaundice CT scan reveals mass pancreatic. Had a failed ERCP, IR placed a T-tube for drainage. Called patient to discuss discharge planning. IR NP in with patient currently. Will call patient back  Expected Discharge Plan: [place] Barriers to Discharge: Continued Medical Work up   Patient Goals and CMS Choice        Expected Discharge Plan and Services Expected Discharge Plan: [place]   Discharge Planning Services: CM Consult   Living arrangements for the past 2 months: Single Family Home                                      Prior Living Arrangements/Services Living arrangements for the past 2 months: Single Family Home   Patient language and need for interpreter reviewed:: Yes        Need for Family Participation in Patient Care: Yes (Comment) Care giver support system in place?: Yes (comment)   Criminal Activity/Legal Involvement Pertinent to Current Situation/Hospitalization: No - Comment as needed  Activities of Daily Living Home Assistive Devices/Equipment: None ADL Screening (condition at time of admission) Patient's cognitive ability adequate to safely complete daily activities?: Yes Is the patient deaf or have difficulty hearing?: No Does the patient have difficulty seeing, even when wearing glasses/contacts?: No Does the patient have difficulty concentrating, remembering, or making decisions?: No Patient able to express need for assistance with ADLs?: Yes Does the patient have difficulty dressing or bathing?: No Independently performs ADLs?: Yes (appropriate for developmental age) Does the patient have  difficulty walking or climbing stairs?: No Weakness of Legs: None Weakness of Arms/Hands: None  Permission Sought/Granted                  Emotional Assessment       Orientation: : Oriented to Self, Oriented to Place, Oriented to  Time, Oriented to Situation Alcohol / Substance Use: Not Applicable Psych Involvement: No (comment)  Admission diagnosis:  Hyperbilirubinemia [E80.6] Jaundice [R17] Pancreatic mass [K86.89] Obstructive jaundice [K83.1] 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 ([place]) [K83.1, C80.1] Patient Active Problem List   Diagnosis Date Noted   Obstructive jaundice 07/18/2021   Pancreatic mass 07/18/2021   Acute non-recurrent pansinusitis 07/11/2021   Shortness of breath 07/11/2021   Acute respiratory failure due to COVID-19 [facility]) 07/27/2020   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   Acquired hypothyroidism 07/15/2009   FATIGUE 07/15/2009   SORE THROAT 06/11/2009   ALLERGIC RHINITIS 05/25/2009   HOARSENESS 05/25/2009   HYPERTHYROIDISM 11/07/2008   Essential hypertension 11/07/2008   DIVERTICULOSIS, COLON 11/07/2008   HLD (hyperlipidemia) 10/04/2007   Anxiety 10/04/2007   Depression 10/04/2007   GERD 10/04/2007   COLONIC POLYPS, HX OF 10/04/2007   PCP:  Ronnell Freshwater, NP Pharmacy:   [facility] [place], [place] [address] Phone: [phone] Fax: [phone]  [facility] [place], [place]  [place] [address] Phone: [phone] Fax: [phone]  [place], [place] [address] Phone: [phone] Fax: [phone]     Social Determinants of Health (SDOH) Interventions    Readmission Risk Interventions No flowsheet data found.

## 2021-07-23 NOTE — Discharge Instructions (Signed)
If you develop abdominal pain please reconnect the drainage catheter to the gravity bag provided to you at discharge and call our office with further instructions. The drain site must be kept clean and dry. Change the dressing daily or as needed. Do not submerge the site in water. It's ok to shower but the site must be covered with an occlusive dressing. If you notice any pain, redness, swelling or leakage around the drain site please call our office.

## 2021-07-23 NOTE — Discharge Summary (Signed)
PATIENT DETAILS Name: Angel Keller. Age: 80 y.o. Sex: male Date of Birth: 1941-02-28 MRN: [id number]. Admitting Physician: Aline August, MD SJG:GEZMOQ, Greer Ee, NP  Admit Date: 07/18/2021 Discharge date: 07/23/2021  Recommendations for Outpatient Follow-up:  Follow up with PCP in 1-2 weeks Please obtain CMP/CBC in one week Please ensure follow-up with IR and gastroenterology. Follow cytology from bile duct brushing  Admitted From:  Home  Disposition: [place]: No  Equipment/Devices: None  Discharge Condition: Stable  CODE STATUS: FULL CODE  Diet recommendation:  Diet Order             Diet - low sodium heart healthy           Diet regular Room service appropriate? Yes; Fluid consistency: Thin  Diet effective now                    Brief Summary: Patient is a 80 y.o. 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  See below for further details.  Brief Hospital Course: Obstructive jaundice: Due to pancreatic neoplasm/mass-unsuccessful ERCP on 12/6-IR performed percutaneous drain placement on 12/8.  Thankfully-LFTs downtrending after drain placement.Evaluated by both IR and GI on 12/9-we will plan for discharge later today-patient will follow-up with IR and GI in the outpatient setting for further continued care.  Spoke with case manager-patient feels comfortable taking care of biliary drain himself and does not want home health RN.  Cytology from bile duct brushings pending at the time of discharge and will need to be followed.  Once formal diagnosis of malignancy obtained-we will need outpatient oncology evaluation.    Hypokalemia: Will be repleted prior to discharge-PCP to repeat electrolytes next week.  Hyponatremia: Mild-asymptomatic-continue to monitor closely.  Hypothyroidism: Continue  Synthroid  HTN: BP relatively stable-continue bisoprolol and HCTZ.   HLD: Hold statins due to elevated LFTs.  Mood disorder: Stable-continue Lexapro   Nutrition Status: Nutrition Problem: Increased nutrient needs Etiology: acute illness Signs/Symptoms: estimated needs Interventions: MVI   BMI Estimated body mass index is 24.01 kg/m as calculated from the following:   Height as of this encounter: 5\' 10"  (1.778 m).   Weight as of this encounter: 75.9 kg.    Procedures 12/6>> ERCP-unsuccessful 12/8>> percutaneous transhepatic cholangiogram with internal/external biliary drain placement.  Transhepatic brush biopsy x3.  Discharge Diagnoses:  Principal Problem:   Obstructive jaundice Active Problems:   Acquired hypothyroidism   HLD (hyperlipidemia)   Anxiety   Depression   Essential hypertension   Pancreatic mass   Discharge Instructions:  Activity:  As tolerated with Full fall precautions use walker/cane & assistance as needed   Discharge Instructions     Call MD for:  persistant nausea and vomiting   Complete by: As directed    Call MD for:  redness, tenderness, or signs of infection (pain, swelling, redness, odor or green/yellow discharge around incision site)   Complete by: As directed    Call MD for:  severe uncontrolled pain   Complete by: As directed    Diet - low sodium heart healthy   Complete by: As directed    Discharge instructions   Complete by: As directed    Follow with Primary MD  Ronnell Freshwater, NP in 1-2 weeks  Please get a complete blood count and chemistry panel checked by your Primary MD at your next visit,  and again as instructed by your Primary MD.  Get Medicines reviewed and adjusted: Please take all your medications with you for your next visit with your Primary MD  Laboratory/radiological data: Please request your Primary MD to go over all hospital tests and procedure/radiological results at the follow up, please ask your Primary  MD to get all Hospital records sent to his/her office.  In some cases, they will be blood work, cultures and biopsy results pending at the time of your discharge. Please request that your primary care M.D. follows up on these results.  Also Note the following: If you experience worsening of your admission symptoms, develop shortness of breath, life threatening emergency, suicidal or homicidal thoughts you must seek medical attention immediately by calling 911 or calling your MD immediately  if symptoms less severe.  You must read complete instructions/literature along with all the possible adverse reactions/side effects for all the Medicines you take and that have been prescribed to you. Take any new Medicines after you have completely understood and accpet all the possible adverse reactions/side effects.   Do not drive when taking Pain medications or sleeping medications (Benzodaizepines)  Do not take more than prescribed Pain, Sleep and Anxiety Medications. It is not advisable to combine anxiety,sleep and pain medications without talking with your primary care practitioner  Special Instructions: If you have smoked or chewed Tobacco  in the last 2 yrs please stop smoking, stop any regular Alcohol  and or any Recreational drug use.  Wear Seat belts while driving.  Please note: You were cared for by a hospitalist during your hospital stay. Once you are discharged, your primary care physician will handle any further medical issues. Please note that NO REFILLS for any discharge medications will be authorized once you are discharged, as it is imperative that you return to your primary care physician (or establish a relationship with a primary care physician if you do not have one) for your post hospital discharge needs so that they can reassess your need for medications and monitor your lab values.   1.  You will receive follow-up care at [place] [facility] and GI clinic.  2.  Pathology/cytology from brush  biopsy is still pending-please have your primary care practitioner follow-up on these results.  3.The drain site must remain clean/dry. Ok to shower as long as an occlusive dressing is placed over the site.Change the dressing daily or as needed.   Increase activity slowly   Complete by: As directed    No dressing needed   Complete by: As directed       Allergies as of 07/23/2021   No Known Allergies      Medication List     STOP taking these medications    allopurinol 300 MG tablet Commonly known as: ZYLOPRIM   amoxicillin 875 MG tablet Commonly known as: AMOXIL   lovastatin 20 MG tablet Commonly known as: MEVACOR       TAKE these medications    aspirin EC 81 MG tablet Take 1 tablet (81 mg total) by mouth daily. Hold while on blood thinner/eliquis for 3 months, then resume What changed: additional instructions   bisoprolol-hydrochlorothiazide 2.5-6.25 MG tablet Commonly known as: ZIAC Take 1 tablet by mouth every morning.   escitalopram 20 MG tablet Commonly known as: LEXAPRO Take 20 mg by mouth daily.   oxyCODONE 5 MG immediate release tablet Commonly known as: Oxy IR/ROXICODONE Take 1 tablet (5 mg total) by mouth every 6 (six) hours as needed for  moderate pain.   pantoprazole 40 MG tablet Commonly known as: PROTONIX Take 1 tablet (40 mg total) by mouth daily.   Synthroid 112 MCG tablet Generic drug: levothyroxine Take 112 mcg by mouth daily before breakfast.   vitamin B-12 1000 MCG tablet Commonly known as: CYANOCOBALAMIN Take 2,000 mcg by mouth daily.   Vitamin D3 50 MCG (2000 UT) Tabs Take 2,000 Units by mouth daily.               Discharge Care Instructions  (From admission, onward)           Start     Ordered   07/23/21 0000  No dressing needed        07/23/21 1402            Follow-up Information     [place] Follow up.   Why: Please follow up with [facility] in approximately 4  weeks. A scheduler from our office will call you with a date/time of your appointment. If you develop abdominal pain please re-connect the drain to the gravity bag and call our office. Please call our office with any questions prior to your visit. Contact information: [place] [address] [phone]                No Known Allergies    Consultations: GI, interventional radiology.  Other Procedures/Studies: DG Chest 2 View  Result Date: 07/18/2021 CLINICAL DATA:  Epigastric pain and nausea. EXAM: CHEST - 2 VIEW COMPARISON:  July 27, 2020 FINDINGS: Tortuosity of the aorta. Cardiomediastinal silhouette is normal. Mediastinal contours appear intact. Linear peribronchial opacities in bilateral lung bases, left greater than right. Osseous structures are without acute abnormality. Soft tissues are grossly normal. IMPRESSION: Linear peribronchial opacities in bilateral lung bases, left greater than right. This may represent atelectasis, bronchitic changes or atypical pneumonia. Electronically Signed   By: Fidela Salisbury M.D.   On: 07/18/2021 13:50   CT Abdomen Pelvis W Contrast  Result Date: 07/18/2021 CLINICAL DATA:  Epigastric pain. EXAM: CT ABDOMEN AND PELVIS WITH CONTRAST TECHNIQUE: Multidetector CT imaging of the abdomen and pelvis was performed using the standard protocol following bolus administration of intravenous contrast. 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 Hepatobiliary: Intrahepatic and extrahepatic biliary ductal dilation. Otherwise normal attenuation of the liver. The gallbladder is normally distended. The extrahepatic common bile duct measures 1.4 cm. Pancreas: Suspected hypoattenuated mass within the proximal body of the pancreas measures 2.3 x 2.6 x 2.0 cm. There is a downstream dilation of the main pancreatic duct and its  branches. It is likely the cause for the biliary ductal dilation as well as it compresses the ampulla. Spleen: Normal in size without focal abnormality. Adrenals/Urinary Tract: Adrenal glands are unremarkable. Kidneys are normal, without renal calculi, focal lesion, or hydronephrosis. Bladder is unremarkable. Stomach/Bowel: Stomach is within normal limits. No evidence of bowel wall thickening, distention, or inflammatory changes. Vascular/Lymphatic: Aortic atherosclerosis. No enlarged abdominal or pelvic lymph nodes. Reproductive: Prostate is unremarkable. Other: No abdominal wall hernia or abnormality. No abdominopelvic ascites. Musculoskeletal: Spondylosis of the thoracolumbar spine. IMPRESSION: 1. Suspected solid mass within the proximal body of the pancreas measures 2.3 x 2.6 x 2.0 cm. There is downstream dilation of the main pancreatic duct and its branches. There is also intra and extrahepatic biliary ductal dilation. Further evaluation with MRI of the abdomen,  pancreatic protocol, when clinically feasible, may be considered. 2. 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 3. Aortic atherosclerosis. Aortic Atherosclerosis (ICD10-I70.0). These results were called by telephone at the time of interpretation on 07/18/2021 at 4:03 pm to provider Carmin Muskrat , who verbally acknowledged these results. Electronically Signed   By: Fidela Salisbury M.D.   On: 07/18/2021 16:03   DG ERCP  Result Date: 07/20/2021 CLINICAL DATA:  Pancreatic mass.  ERCP. EXAM: ERCP TECHNIQUE: Multiple spot images obtained with the fluoroscopic device and submitted for interpretation post-procedure. FLUOROSCOPY TIME:  1 minute, 58 seconds (23.6 mGy) COMPARISON:  CT abdomen and pelvis - 07/18/2021 FINDINGS: Single spot intraoperative fluoroscopic image of the right upper abdominal quadrant is provided for review. There is no definitive opacification the CBD or pancreatic duct. IMPRESSION: Nondiagnostic ERCP as  above. Correlation with the operative report is advised. These images were submitted for radiologic interpretation only. Please see the procedural report for the amount of contrast and the fluoroscopy time utilized. Electronically Signed   By: Sandi Mariscal M.D.   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 He presents for percutaneous transhepatic cholangiogram, percutaneous biliary drain placement and brush biopsy if possible. EXAM: IR ENDOLUMINAL BIOPSY OF BILIARY TREE; IR INT-EXT BILIARY DRAIN W/ CHOLANGIOGRAM MEDICATIONS: In patient currently on antibiotics. ANESTHESIA/SEDATION: Moderate (conscious) sedation was employed during this procedure. A total of Versed 2 mg and Fentanyl 150 mcg was administered intravenously. Moderate Sedation Time: 33 minutes. The patient's level of consciousness and vital signs were monitored continuously by radiology nursing throughout the procedure under my direct supervision. FLUOROSCOPY TIME:  Fluoroscopy Time: 8 minutes 6 seconds (68 mGy). COMPLICATIONS: None immediate. PROCEDURE: Informed written consent was obtained from the patient. After a thorough discussion of the procedural risks, benefits and alternatives. All questions were addressed. Maximal Sterile Barrier Technique was utilized including caps, mask, sterile gowns, sterile gloves, sterile drape, hand hygiene and skin antiseptic. A timeout was performed prior to the initiation of the procedure. the liver was interrogated with ultrasound. Intrahepatic biliary ductal dilatation is visualized. A suitable peripheral radicle in the right hemi liver was identified. Local anesthesia was attained by infiltration with 1% lidocaine. A small dermatotomy was made. Under real-time ultrasound guidance, a 21 gauge trocar needle was used to puncture the peripheral biliary radicle. A  transhepatic percutaneous cholangiogram was then performed confirming good access of the biliary tree. There is marked intra and extrahepatic biliary ductal dilatation. The cystic duct is patent. Contrast opacifies the gallbladder. Both the right and left ducts are opacified. There is complete occlusion of the common bile duct distally. A 0.018 wire was advanced into the common bile duct. The soft tissue tract was dilated utilizing the transitional sheath. An angled 4 French glide catheter and roadrunner wire were then advanced coaxially and used to successfully navigate through the obstructed distal common bile duct and into the duodenum. The road runner wire was exchanged for an Amplatz wire. The transitional sheath was exchanged for an 8 [place] Brite tip sheath which was advanced into the distal common bile duct just proximal to the stenosis. Brush biopsy was then performed 3 times. Specimens were placed in both formalin and saline and delivered to pathology for further analysis. The 8 French sheath was removed. A 10 French internal/external biliary drainage catheter was then advanced over the wire and formed with the locking loop in  the duodenum. Contrast injection confirms excellent placement and adequate drainage. The catheter was connected to gravity bag drainage and secured to the skin with 0 Prolene suture. The patient tolerated the procedure well. IMPRESSION: 1. 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 2. Successful brush biopsy of obstructing lesion x3. 3. Successful placement of a 10 French internal/external biliary drainage catheter. PLAN: 1. Follow bilirubin. When significant downward trend is clear, the bag should be capped. Recommend capping bag before discharge home if possible. 2. Follow-up in IR in 4-6 weeks for initial biliary tube check and exchange. If initial brush biopsies are negative, repeat biopsy could be considered at that  time. Signed, Criselda Peaches, MD, [place] [facility] Electronically Signed   By: Jacqulynn Cadet M.D.   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 He presents for percutaneous transhepatic cholangiogram, percutaneous biliary drain placement and brush biopsy if possible. EXAM: IR ENDOLUMINAL BIOPSY OF BILIARY TREE; IR INT-EXT BILIARY DRAIN W/ CHOLANGIOGRAM MEDICATIONS: In patient currently on antibiotics. ANESTHESIA/SEDATION: Moderate (conscious) sedation was employed during this procedure. A total of Versed 2 mg and Fentanyl 150 mcg was administered intravenously. Moderate Sedation Time: 33 minutes. The patient's level of consciousness and vital signs were monitored continuously by radiology nursing throughout the procedure under my direct supervision. FLUOROSCOPY TIME:  Fluoroscopy Time: 8 minutes 6 seconds (68 mGy). COMPLICATIONS: None immediate. PROCEDURE: Informed written consent was obtained from the patient. After a thorough discussion of the procedural risks, benefits and alternatives. All questions were addressed. Maximal Sterile Barrier Technique was utilized including caps, mask, sterile gowns, sterile gloves, sterile drape, hand hygiene and skin antiseptic. A timeout was performed prior to the initiation of the procedure. the liver was interrogated with ultrasound. Intrahepatic biliary ductal dilatation is visualized. A suitable peripheral radicle in the right hemi liver was identified. Local anesthesia was attained by infiltration with 1% lidocaine. A small dermatotomy was made. Under real-time ultrasound guidance, a 21 gauge trocar needle was used to puncture the peripheral biliary radicle. A transhepatic percutaneous cholangiogram was then performed confirming good access of the biliary tree. There is  marked intra and extrahepatic biliary ductal dilatation. The cystic duct is patent. Contrast opacifies the gallbladder. Both the right and left ducts are opacified. There is complete occlusion of the common bile duct distally. A 0.018 wire was advanced into the common bile duct. The soft tissue tract was dilated utilizing the transitional sheath. An angled 4 French glide catheter and roadrunner wire were then advanced coaxially and used to successfully navigate through the obstructed distal common bile duct and into the duodenum. The road runner wire was exchanged for an Amplatz wire. The transitional sheath was exchanged for an 8 [place] Brite tip sheath which was advanced into the distal common bile duct just proximal to the stenosis. Brush biopsy was then performed 3 times. Specimens were placed in both formalin and saline and delivered to pathology for further analysis. The 8 French sheath was removed. A 10 French internal/external biliary drainage catheter was then advanced over the wire and formed with the locking loop in the duodenum. Contrast injection confirms excellent placement and adequate drainage. The catheter was connected to gravity bag drainage and secured to the skin with 0 Prolene suture. The patient tolerated the procedure well. IMPRESSION: 1. Percutaneous transhepatic cholangiogram demonstrates complete occlusion of the  distal common bile duct. 2. Successful brush biopsy of obstructing lesion x3. 3. Successful placement of a 10 French internal/external biliary drainage catheter. PLAN: 1. Follow bilirubin. When significant downward trend is clear, the bag should be capped. Recommend capping bag before discharge home if possible. 2. Follow-up in IR in 4-6 weeks for initial biliary tube check and exchange. If initial brush biopsies are negative, repeat biopsy could be considered at that time. Signed, Criselda Peaches, MD, [place] [facility]  Electronically Signed   By: Jacqulynn Cadet M.D.   On: 07/22/2021 17:16     TODAY-DAY OF DISCHARGE:  Subjective:   Virgle Arth today has no headache,no chest abdominal pain,no new weakness tingling or numbness, feels much better wants to go home today.   Objective:   Blood pressure 122/70, pulse (!) 52, temperature 98 F (36.7 C), temperature source Oral, resp. rate 17, height 5\' 10"  (1.778 m), weight 79.5 kg, SpO2 96 %.  Intake/Output Summary (Last 24 hours) at 07/23/2021 1403 Last data filed at 07/23/2021 1201 Gross per 24 hour  Intake 440 ml  Output 1360 ml  Net -920 ml   Filed Weights   07/21/21 0454 07/22/21 0500 07/23/21 0500  Weight: 79.2 kg 75.9 kg 79.5 kg    Exam: Awake Alert, Oriented *3, No new F.N deficits, Normal affect [place].AT,PERRAL Supple Neck,No JVD, No cervical lymphadenopathy appriciated.  Symmetrical Chest wall movement, Good air movement bilaterally, CTAB RRR,No Gallops,Rubs or new Murmurs, No Parasternal Heave +ve B.Sounds, Abd Soft, Non tender, No organomegaly appriciated, No rebound -guarding or rigidity. No Cyanosis, Clubbing or edema, No new Rash or bruise   PERTINENT RADIOLOGIC STUDIES: IR INT EXT BILIARY DRAIN WITH CHOLANGIOGRAM  Result Date: 07/22/2021 INDICATION: 80 year old male with obstructed jaundice and 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 He presents for percutaneous transhepatic cholangiogram, percutaneous biliary drain placement and brush biopsy if possible. EXAM: IR ENDOLUMINAL BIOPSY OF BILIARY TREE; IR INT-EXT BILIARY DRAIN W/ CHOLANGIOGRAM MEDICATIONS: In patient currently on antibiotics. ANESTHESIA/SEDATION: Moderate (conscious) sedation was employed during this procedure. A total of Versed 2 mg and Fentanyl 150 mcg was administered intravenously. Moderate Sedation Time: 33 minutes. The patient's level of consciousness and vital signs were monitored continuously by radiology nursing throughout the procedure under my direct  supervision. FLUOROSCOPY TIME:  Fluoroscopy Time: 8 minutes 6 seconds (68 mGy). COMPLICATIONS: None immediate. PROCEDURE: Informed written consent was obtained from the patient. After a thorough discussion of the procedural risks, benefits and alternatives. All questions were addressed. Maximal Sterile Barrier Technique was utilized including caps, mask, sterile gowns, sterile gloves, sterile drape, hand hygiene and skin antiseptic. A timeout was performed prior to the initiation of the procedure. the liver was interrogated with ultrasound. Intrahepatic biliary ductal dilatation is visualized. A suitable peripheral radicle in the right hemi liver was identified. Local anesthesia was attained by infiltration with 1% lidocaine. A small dermatotomy was made. Under real-time ultrasound guidance, a 21 gauge trocar needle was used to puncture the peripheral biliary radicle. A transhepatic percutaneous cholangiogram was then performed confirming good access of the biliary tree. There is marked intra and extrahepatic biliary ductal dilatation. The cystic duct is patent. Contrast opacifies the gallbladder. Both the right and left ducts are opacified. There is complete occlusion of the common bile duct distally. A 0.018 wire was advanced into the common bile duct. The soft tissue tract was dilated utilizing the transitional sheath. An angled 4 French glide catheter and roadrunner wire were then advanced coaxially and  used to successfully navigate through the obstructed distal common bile duct and into the duodenum. The road runner wire was exchanged for an Amplatz wire. The transitional sheath was exchanged for an 8 [place] Brite tip sheath which was advanced into the distal common bile duct just proximal to the stenosis. Brush biopsy was then performed 3 times. Specimens were placed in both formalin and saline and delivered to pathology for further analysis. The 8 French sheath was removed. A 10 French internal/external  biliary drainage catheter was then advanced over the wire and formed with the locking loop in the duodenum. Contrast injection confirms excellent placement and adequate drainage. The catheter was connected to gravity bag drainage and secured to the skin with 0 Prolene suture. The patient tolerated the procedure well. IMPRESSION: 1. 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 2. Successful brush biopsy of obstructing lesion x3. 3. Successful placement of a 10 French internal/external biliary drainage catheter. PLAN: 1. Follow bilirubin. When significant downward trend is clear, the bag should be capped. Recommend capping bag before discharge home if possible. 2. Follow-up in IR in 4-6 weeks for initial biliary tube check and exchange. If initial brush biopsies are negative, repeat biopsy could be considered at that time. Signed, Criselda Peaches, MD, [facility] Radiology Electronically Signed   By: Jacqulynn Cadet M.D.   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 He presents for percutaneous transhepatic cholangiogram, percutaneous biliary drain placement and brush biopsy if possible. EXAM: IR ENDOLUMINAL BIOPSY OF BILIARY TREE; IR INT-EXT BILIARY DRAIN W/ CHOLANGIOGRAM MEDICATIONS: In patient currently on antibiotics. ANESTHESIA/SEDATION: Moderate (conscious) sedation was employed during this procedure. A total of Versed 2 mg and Fentanyl 150 mcg was administered intravenously. Moderate Sedation Time: 33 minutes. The patient's level of consciousness and vital signs were monitored continuously by radiology nursing throughout the procedure under my direct supervision. FLUOROSCOPY TIME:  Fluoroscopy Time: 8 minutes 6 seconds (68 mGy). COMPLICATIONS: None  immediate. PROCEDURE: Informed written consent was obtained from the patient. After a thorough discussion of the procedural risks, benefits and alternatives. All questions were addressed. Maximal Sterile Barrier Technique was utilized including caps, mask, sterile gowns, sterile gloves, sterile drape, hand hygiene and skin antiseptic. A timeout was performed prior to the initiation of the procedure. the liver was interrogated with ultrasound. Intrahepatic biliary ductal dilatation is visualized. A suitable peripheral radicle in the right hemi liver was identified. Local anesthesia was attained by infiltration with 1% lidocaine. A small dermatotomy was made. Under real-time ultrasound guidance, a 21 gauge trocar needle was used to puncture the peripheral biliary radicle. A transhepatic percutaneous cholangiogram was then performed confirming good access of the biliary tree. There is marked intra and extrahepatic biliary ductal dilatation. The cystic duct is patent. Contrast opacifies the gallbladder. Both the right and left ducts are opacified. There is complete occlusion of the common bile duct distally. A 0.018 wire was advanced into the common bile duct. The soft tissue tract was dilated utilizing the transitional sheath. An angled 4 French glide catheter and roadrunner wire were then advanced coaxially and used to successfully navigate through the obstructed distal common bile duct and into the duodenum. The road runner wire was exchanged for an Amplatz wire. The transitional sheath was exchanged for an 8 [place] Brite tip sheath which was advanced into the distal  common bile duct just proximal to the stenosis. Brush biopsy was then performed 3 times. Specimens were placed in both formalin and saline and delivered to pathology for further analysis. The 8 French sheath was removed. A 10 French internal/external biliary drainage catheter was then advanced over the wire and formed with the locking loop in the  duodenum. Contrast injection confirms excellent placement and adequate drainage. The catheter was connected to gravity bag drainage and secured to the skin with 0 Prolene suture. The patient tolerated the procedure well. IMPRESSION: 1. 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 2. Successful brush biopsy of obstructing lesion x3. 3. Successful placement of a 10 French internal/external biliary drainage catheter. PLAN: 1. Follow bilirubin. When significant downward trend is clear, the bag should be capped. Recommend capping bag before discharge home if possible. 2. Follow-up in IR in 4-6 weeks for initial biliary tube check and exchange. If initial brush biopsies are negative, repeat biopsy could be considered at that time. Signed, Criselda Peaches, MD, [facility] Radiology Electronically Signed   By: Jacqulynn Cadet M.D.   On: 07/22/2021 17:16     PERTINENT LAB RESULTS: CBC: Recent Labs    07/21/21 0114  WBC 5.6  HGB 12.3*  HCT 36.7*  PLT 235   CMET CMP     Component Value Date/Time   NA 133 (L) 07/23/2021 0253   K 3.3 (L) 07/23/2021 0253   CL 96 (L) 07/23/2021 0253   CO2 26 07/23/2021 0253   GLUCOSE 157 (H) 07/23/2021 0253   BUN 18 07/23/2021 0253   CREATININE 1.13 07/23/2021 0253   CALCIUM 8.7 (L) 07/23/2021 0253   PROT 6.4 (L) 07/23/2021 0253   ALBUMIN 2.6 (L) 07/23/2021 0253   AST 82 (H) 07/23/2021 0253   ALT 99 (H) 07/23/2021 0253   ALKPHOS 460 (H) 07/23/2021 0253   BILITOT 10.1 (H) 07/23/2021 0253   GFRNONAA >60 07/23/2021 0253   GFRAA 55 (L) 02/20/2019 0608    GFR Estimated Creatinine Clearance: 52.9 mL/min (by C-G formula based on SCr of 1.13 mg/dL). No results for input(s): LIPASE, AMYLASE in the last 72 hours. No results for input(s): CKTOTAL, CKMB, CKMBINDEX, TROPONINI in the last 72 hours. Invalid input(s): POCBNP No results for input(s): DDIMER  in the last 72 hours. No results for input(s): HGBA1C in the last 72 hours. No results for input(s): CHOL, HDL, LDLCALC, TRIG, CHOLHDL, LDLDIRECT in the last 72 hours. No results for input(s): TSH, T4TOTAL, T3FREE, THYROIDAB in the last 72 hours.  Invalid input(s): FREET3 No results for input(s): VITAMINB12, FOLATE, FERRITIN, TIBC, IRON, RETICCTPCT in the last 72 hours. Coags: Recent Labs    07/21/21 0114 07/22/21 0157  INR 1.7* 1.2   Microbiology: Recent Results (from the past 240 hour(s))  Resp Panel by RT-PCR (Flu A&B, Covid) Nasopharyngeal Swab     Status: None   Collection Time: 07/18/21  4:13 PM   Specimen: Nasopharyngeal Swab; Nasopharyngeal(NP) swabs in vial transport medium  Result Value Ref Range Status   SARS Coronavirus 2 by RT PCR NEGATIVE NEGATIVE Final    Comment: (NOTE) SARS-CoV-2 target nucleic acids are NOT DETECTED.  The SARS-CoV-2 RNA is generally detectable in upper respiratory specimens during the acute phase of infection. The lowest concentration of SARS-CoV-2 viral copies this assay can detect is 138 copies/mL. A negative result does not preclude SARS-Cov-2 infection and should not be used as the sole basis for treatment or other  patient management decisions. A negative result may occur with  improper specimen collection/handling, submission of specimen other than nasopharyngeal swab, presence of viral mutation(s) within the areas targeted by this assay, and inadequate number of viral copies(<138 copies/mL). A negative result must be combined with clinical observations, patient history, and epidemiological information. The expected result is Negative.  Fact Sheet for Patients:  EntrepreneurPulse.com.au  Fact Sheet for Healthcare Providers:  IncredibleEmployment.be  This test is no t yet approved or cleared by the [place] FDA and  has been authorized for detection and/or diagnosis of SARS-CoV-2 by FDA under  an Emergency Use Authorization (EUA). This EUA will remain  in effect (meaning this test can be used) for the duration of the COVID-19 declaration under Section 564(b)(1) of the Act, 21 U.S.C.section 360bbb-3(b)(1), unless the authorization is terminated  or revoked sooner.       Influenza A by PCR NEGATIVE NEGATIVE Final   Influenza B by PCR NEGATIVE NEGATIVE Final    Comment: (NOTE) The Xpert Xpress SARS-CoV-2/FLU/RSV plus assay is intended as an aid in the diagnosis of influenza from Nasopharyngeal swab specimens and should not be used as a sole basis for treatment. Nasal washings and aspirates are unacceptable for Xpert Xpress SARS-CoV-2/FLU/RSV testing.  Fact Sheet for Patients: EntrepreneurPulse.com.au  Fact Sheet for Healthcare Providers: IncredibleEmployment.be  This test is not yet approved or cleared by the [place] FDA and has been authorized for detection and/or diagnosis of SARS-CoV-2 by FDA under an Emergency Use Authorization (EUA). This EUA will remain in effect (meaning this test can be used) for the duration of the COVID-19 declaration under Section 564(b)(1) of the Act, 21 U.S.C. section 360bbb-3(b)(1), unless the authorization is terminated or revoked.  Performed at [facility], [place] [address] [place], [place] [address]     FURTHER DISCHARGE INSTRUCTIONS:  Get Medicines reviewed and adjusted: Please take all your medications with you for your next visit with your Primary MD  Laboratory/radiological data: Please request your Primary MD to go over all hospital tests and procedure/radiological results at the follow up, please ask your Primary MD to get all Hospital records sent to his/her office.  In some cases, they will be blood work, cultures and biopsy results pending at the time of your discharge. Please request that your primary care M.D. goes through all the records of your hospital data and follows up  on these results.  Also Note the following: If you experience worsening of your admission symptoms, develop shortness of breath, life threatening emergency, suicidal or homicidal thoughts you must seek medical attention immediately by calling 911 or calling your MD immediately  if symptoms less severe.  You must read complete instructions/literature along with all the possible adverse reactions/side effects for all the Medicines you take and that have been prescribed to you. Take any new Medicines after you have completely understood and accpet all the possible adverse reactions/side effects.   Do not drive when taking Pain medications or sleeping medications (Benzodaizepines)  Do not take more than prescribed Pain, Sleep and Anxiety Medications. It is not advisable to combine anxiety,sleep and pain medications without talking with your primary care practitioner  Special Instructions: If you have smoked or chewed Tobacco  in the last 2 yrs please stop smoking, stop any regular Alcohol  and or any Recreational drug use.  Wear Seat belts while driving.  Please note: You were cared for by a hospitalist during your hospital stay. Once you are discharged, your primary  care physician will handle any further medical issues. Please note that NO REFILLS for any discharge medications will be authorized once you are discharged, as it is imperative that you return to your primary care physician (or establish a relationship with a primary care physician if you do not have one) for your post hospital discharge needs so that they can reassess your need for medications and monitor your lab values.  Total Time spent coordinating discharge including counseling, education and face to face time equals 35 minutes.  SignedOren Binet 07/23/2021 2:03 PM

## 2021-07-27 ENCOUNTER — Other Ambulatory Visit: Payer: Self-pay

## 2021-07-27 ENCOUNTER — Encounter: Payer: Medicare HMO | Admitting: Nurse Practitioner

## 2021-07-27 ENCOUNTER — Ambulatory Visit (INDEPENDENT_AMBULATORY_CARE_PROVIDER_SITE_OTHER): Payer: Medicare HMO | Admitting: Nurse Practitioner

## 2021-07-27 VITALS — BP 152/65 | HR 50 | Temp 95.4°F | Ht 70.0 in | Wt 155.5 lb

## 2021-07-27 DIAGNOSIS — R791 Abnormal coagulation profile: Secondary | ICD-10-CM

## 2021-07-27 DIAGNOSIS — R110 Nausea: Secondary | ICD-10-CM

## 2021-07-27 DIAGNOSIS — R945 Abnormal results of liver function studies: Secondary | ICD-10-CM

## 2021-07-27 DIAGNOSIS — K831 Obstruction of bile duct: Secondary | ICD-10-CM | POA: Diagnosis not present

## 2021-07-27 DIAGNOSIS — E039 Hypothyroidism, unspecified: Secondary | ICD-10-CM

## 2021-07-27 DIAGNOSIS — Z09 Encounter for follow-up examination after completed treatment for conditions other than malignant neoplasm: Secondary | ICD-10-CM

## 2021-07-27 DIAGNOSIS — D378 Neoplasm of uncertain behavior of other specified digestive organs: Secondary | ICD-10-CM

## 2021-07-27 DIAGNOSIS — D509 Iron deficiency anemia, unspecified: Secondary | ICD-10-CM

## 2021-07-27 LAB — CYTOLOGY - NON PAP

## 2021-07-27 MED ORDER — ONDANSETRON 4 MG PO TBDP: 4.0000 mg | ORAL_TABLET | Freq: Three times a day (TID) | ORAL | 1 refills | Status: DC | PRN

## 2021-07-27 NOTE — Progress Notes (Signed)
Established Patient Office Visit  Subjective:  Patient ID: Angel Reali., male    DOB: March 26, 1941  Age: 80 y.o. MRN: [id number]  CC:  Chief Complaint  Patient presents with   Follow-up     HPI Angel LANCON Sr. presents for hospital follow up. Was in the hospital for six days due to obstructive jaundice. Did have CT scan showing the following: 1. Suspected solid mass within the proximal body of the pancreas measures 2.3 x 2.6 x 2.0 cm. There is downstream dilation of the main pancreatic duct and its branches. There is also intra and extrahepatic biliary ductal dilation. Further evaluation with MRI of the abdomen, pancreatic protocol, when clinically feasible, may be considered. 2. 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 3. Aortic atherosclerosis. 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  Patient unable to eat or drink. Feels like there is a great deal of pressure on upper abdomen preventing him from eating. Losing weight. Feels weak. He is very nauseated also.  Needs to have recheck of CMP, CBC, and thyroid panel.   Past Medical History:  Diagnosis Date   Aneurysm ([place])    2017   Anxiety    Arthritis    Depression    Headache    HLD (hyperlipidemia)    Hx of inguinal hernia repair    Hypertension    Hypothyroidism    Overactive bladder    Pneumonia    Shingles    Sleep apnea    Thyroid disease    Varicose veins of both lower extremities     Past Surgical History:  Procedure Laterality Date   BILATERAL CARPAL TUNNEL RELEASE     CATARACT EXTRACTION, BILATERAL Bilateral    ELBOW SURGERY Bilateral    ENDOSCOPIC RETROGRADE CHOLANGIOPANCREATOGRAPHY (ERCP) WITH PROPOFOL N/A 07/20/2021   Procedure: ENDOSCOPIC RETROGRADE CHOLANGIOPANCREATOGRAPHY (ERCP) WITH PROPOFOL;  Surgeon: Gatha Mayer, MD;  Location: [facility]COPY;  Service: Endoscopy;  Laterality: N/A;   ESOPHAGOGASTRODUODENOSCOPY (EGD) WITH  PROPOFOL N/A 08/05/2021   Procedure: ESOPHAGOGASTRODUODENOSCOPY (EGD) WITH PROPOFOL;  Surgeon: Rush Landmark Telford Nab., MD;  Location: [place];  Service: Gastroenterology;  Laterality: N/A;   EUS N/A 08/05/2021   Procedure: UPPER ENDOSCOPIC ULTRASOUND (EUS) RADIAL;  Surgeon: Irving Copas., MD;  Location: [place];  Service: Gastroenterology;  Laterality: N/A;   FINE NEEDLE ASPIRATION  08/05/2021   Procedure: FINE NEEDLE ASPIRATION (FNA) LINEAR;  Surgeon: Irving Copas., MD;  Location: [place];  Service: Gastroenterology;;   INGUINAL HERNIA REPAIR Right 1951   IR ANGIO INTRA EXTRACRAN SEL COM CAROTID INNOMINATE BILAT MOD SED  02/20/2019   IR ENDOLUMINAL BX OF BILIARY TREE  07/22/2021   IR GENERIC HISTORICAL  10/28/2016   IR RADIOLOGIST EVAL & MGMT 10/28/2016 [facility]   IR INT EXT BILIARY DRAIN WITH CHOLANGIOGRAM  07/22/2021   RADIOLOGY WITH ANESTHESIA N/A 10/21/2015   Procedure: RADIOLOGY WITH ANESTHESIA;  Surgeon: Luanne Bras, MD;  Location: [place];  Service: Radiology;  Laterality: N/A;   RADIOLOGY WITH ANESTHESIA N/A 02/20/2019   Procedure: [employer];  Surgeon: Luanne Bras, MD;  Location: [facility];  Service: Radiology;  Laterality: N/A;   ROTATOR CUFF REPAIR Bilateral    VARICOSE VEIN SURGERY      Family History  Problem Relation Age of Onset   Breast cancer Mother    Lung cancer Father    Alcoholism Sister    Cirrhosis Sister    Colon cancer Neg Hx    Esophageal  cancer Neg Hx    Liver cancer Neg Hx    Pancreatic cancer Neg Hx    Prostate cancer Neg Hx     Social History   Socioeconomic History   Marital status: Widowed    Spouse name: Not on file   Number of children: 5   Years of education: Not on file   Highest education level: Not on file  Occupational History   Occupation: retired  Tobacco Use   Smoking status: Former    Packs/day: 0.25    Years: 1.00    Pack years: 0.25    Types: Cigarettes    Quit date: 08/15/1961     Years since quitting: 60.0   Smokeless tobacco: Never  Vaping Use   Vaping Use: Never used  Substance and Sexual Activity   Alcohol use: No   Drug use: No   Sexual activity: Not Currently  Other Topics Concern   Not on file  Social History Narrative   Not on file   Social Determinants of Health   Financial Resource Strain: Not on file  Food Insecurity: Not on file  Transportation Needs: Not on file  Physical Activity: Not on file  Stress: Not on file  Social Connections: Not on file  Intimate Partner Violence: Not on file    Outpatient Medications Prior to Visit  Medication Sig Dispense Refill   aspirin EC 81 MG tablet Take 1 tablet (81 mg total) by mouth daily. Hold while on blood thinner/eliquis for 3 months, then resume (Patient taking differently: Take 81 mg by mouth daily.) 30 tablet 11   bisoprolol-hydrochlorothiazide (ZIAC) 2.5-6.25 MG tablet Take 1 tablet by mouth every morning.     Cholecalciferol (VITAMIN D3) 50 MCG (2000 UT) TABS Take 2,000 Units by mouth daily.     escitalopram (LEXAPRO) 20 MG tablet Take 20 mg by mouth daily.     oxyCODONE (OXY IR/ROXICODONE) 5 MG immediate release tablet Take 1 tablet (5 mg total) by mouth every 6 (six) hours as needed for moderate pain. 10 tablet 0   pantoprazole (PROTONIX) 40 MG tablet Take 1 tablet (40 mg total) by mouth daily. 30 tablet 2   SYNTHROID 112 MCG tablet Take 112 mcg by mouth daily before breakfast.     vitamin B-12 (CYANOCOBALAMIN) 1000 MCG tablet Take 2,000 mcg by mouth daily.     No facility-administered medications prior to visit.    No Known Allergies  ROS Review of Systems  Constitutional:  Positive for fatigue and unexpected weight change. Negative for activity change, chills and fever.       Patient has had a 21 pound weight loss over the past few weeks. Has been unable to eat due to abdominal discomfort and nausea .  HENT:  Negative for congestion, postnasal drip, rhinorrhea, sinus pressure, sinus  pain, sneezing and sore throat.   Eyes: Negative.   Respiratory:  Negative for cough, shortness of breath and wheezing.   Cardiovascular:  Negative for chest pain and palpitations.  Gastrointestinal:  Positive for abdominal distention, abdominal pain and nausea. Negative for constipation, diarrhea and vomiting.  Endocrine: Negative for cold intolerance, heat intolerance, polydipsia and polyuria.  Genitourinary:  Negative for dysuria, frequency and urgency.  Musculoskeletal:  Negative for back pain and myalgias.  Skin:  Positive for color change. Negative for rash.       Yellow tint to the skin. Patient does have obstructive jaundice.   Allergic/Immunologic: Negative for environmental allergies.  Neurological:  Positive for weakness. Negative  for dizziness and headaches.  Psychiatric/Behavioral:  The patient is not nervous/anxious.      Objective:    Physical Exam Vitals and nursing note reviewed.  Constitutional:      Appearance: Normal appearance. He is well-developed. He is ill-appearing.  HENT:     Head: Normocephalic and atraumatic.     Nose: Nose normal.     Mouth/Throat:     Mouth: Mucous membranes are moist.  Eyes:     Extraocular Movements: Extraocular movements intact.     Conjunctiva/sclera: Conjunctivae normal.     Pupils: Pupils are equal, round, and reactive to light.  Neck:     Vascular: No carotid bruit.  Cardiovascular:     Rate and Rhythm: Normal rate and regular rhythm.     Pulses: Normal pulses.     Heart sounds: Normal heart sounds.  Pulmonary:     Effort: Pulmonary effort is normal.     Breath sounds: Normal breath sounds.  Abdominal:     Palpations: Abdomen is soft. There is no mass.     Tenderness: There is abdominal tenderness. There is no guarding or rebound.     Hernia: No hernia is present.     Comments: Intact drainage catheter in right upper quadrant of the abdomen. Covered with clean and dry dressing .  Musculoskeletal:        General: Normal  range of motion.     Cervical back: Normal range of motion and neck supple.  Lymphadenopathy:     Cervical: No cervical adenopathy.  Skin:    General: Skin is warm and dry.     Capillary Refill: Capillary refill takes less than 2 seconds.  Neurological:     General: No focal deficit present.     Mental Status: He is alert and oriented to person, place, and time.  Psychiatric:        Mood and Affect: Mood normal.        Behavior: Behavior normal.        Thought Content: Thought content normal.        Judgment: Judgment normal.    Today's Vitals   07/27/21 1615  BP: (!) 152/65  Pulse: (!) 50  Temp: (!) 95.4 F (35.2 C)  SpO2: 95%  Weight: 155 lb 8 oz (70.5 kg)  Height: '5\' 10"'  (1.778 m)   Body mass index is 22.31 kg/m.   Wt Readings from Last 3 Encounters:  08/05/21 154 lb 5.2 oz (70 kg)  07/30/21 154 lb 12.8 oz (70.2 kg)  07/27/21 155 lb 8 oz (70.5 kg)     Health Maintenance Due  Topic Date Due   Zoster Vaccines- Shingrix (1 of 2) Never done   Pneumonia Vaccine 62+ Years old (2 - PCV) 11/07/2009   TETANUS/TDAP  11/08/2018   COVID-19 Vaccine (3 - Booster for Pfizer series) 03/11/2020   INFLUENZA VACCINE  03/15/2021    There are no preventive care reminders to display for this patient.  Lab Results  Component Value Date   TSH 4.060 07/27/2021   Lab Results  Component Value Date   WBC 8.5 07/27/2021   HGB 13.2 07/27/2021   HCT 39.8 07/27/2021   MCV 82 07/27/2021   PLT 356 07/27/2021   Lab Results  Component Value Date   NA 135 07/27/2021   K 4.5 07/27/2021   CO2 26 07/27/2021   GLUCOSE 133 (H) 07/27/2021   BUN 21 07/27/2021   CREATININE 1.18 07/27/2021   BILITOT 7.8 (H) 07/27/2021  ALKPHOS 460 (H) 07/27/2021   AST 92 (H) 07/27/2021   ALT 99 (H) 07/27/2021   PROT 7.0 07/27/2021   ALBUMIN 4.1 07/27/2021   CALCIUM 9.5 07/27/2021   ANIONGAP 11 07/23/2021   EGFR 62 07/27/2021   GFR 56.67 (L) 06/29/2018   Lab Results  Component Value Date   CHOL  (H) 07/10/2009    257        ATP III CLASSIFICATION:  <200     mg/dL   Desirable  200-239  mg/dL   Borderline High  >=240    mg/dL   High          Lab Results  Component Value Date   HDL 44 07/10/2009   Lab Results  Component Value Date   LDLCALC (H) 07/10/2009    160        Total Cholesterol/HDL:CHD Risk Coronary Heart Disease Risk Table                     Men   Women  1/2 Average Risk   3.4   3.3  Average Risk       5.0   4.4  2 X Average Risk   9.6   7.1  3 X Average Risk  23.4   11.0        Use the calculated Patient Ratio above and the CHD Risk Table to determine the patient's CHD Risk.        ATP III CLASSIFICATION (LDL):  <100     mg/dL   Optimal  100-129  mg/dL   Near or Above                    Optimal  130-159  mg/dL   Borderline  160-189  mg/dL   High  >190     mg/dL   Very High   Lab Results  Component Value Date   TRIG 125 07/27/2020   Lab Results  Component Value Date   CHOLHDL 5.8 07/10/2009   No results found for: HGBA1C    Assessment & Plan:  1. Hospital discharge follow-up Recent 6 day hospital stay due to obstructive jaundice. reviewed progress notes, labs, and imaging studies from hospitalization.   2. Obstructive jaundice Obstructive jaundice with unclear cause. Refer to interventional radiology and GI for further evaluation and treatment. CMP drawn during today's office visit.  - Ambulatory referral to Interventional Radiology - Ambulatory referral to Gastroenterology - Comp Met (CMET)  3. Nonspecific abnormal results of liver function study CMP drawn during today's visit.  - Comp Met (CMET)  4. Neoplasm of uncertain behavior of pancreas Reviewed results of biopsy done during patient's hospitalization. There were rare cells seen which were consistent with biliary malignancy. Will refer to oncology and GI for further evaluation and treatment.  - Ambulatory referral to Gastroenterology - Ambulatory referral to Hematology /  Oncology  5. Nausea Zofran 9m ODT started. May take up to three times daily if needed for nausea. The 'BRAT' diet is suggested, then progress to diet as tolerated as symptoms abate. - ondansetron (ZOFRAN-ODT) 4 MG disintegrating tablet; Take 1 tablet (4 mg total) by mouth every 8 (eight) hours as needed for nausea or vomiting.  Dispense: 30 tablet; Refill: 1  6. Acquired hypothyroidism Check thyroid panel and adjust levothyroxine dosing as indicated.  - TSH + free T4  7. Iron deficiency anemia, unspecified iron deficiency anemia type Check CBC for further evaluation.  - CBC  8. Abnormal  INR Check INR for further evaluation.  - Protime-INR; Future   Problem List Items Addressed This Visit       Digestive   Obstructive jaundice   Relevant Orders   Ambulatory referral to Interventional Radiology   Ambulatory referral to Gastroenterology   Comp Met (CMET) (Completed)   Neoplasm of uncertain behavior of pancreas   Relevant Orders   Ambulatory referral to Gastroenterology   Ambulatory referral to Hematology / Oncology     Endocrine   Acquired hypothyroidism   Relevant Orders   TSH + free T4 (Completed)     Other   Nonspecific abnormal results of liver function study   Relevant Orders   Comp Met (CMET) (Completed)   Nausea   Relevant Medications   ondansetron (ZOFRAN-ODT) 4 MG disintegrating tablet   Iron deficiency anemia   Relevant Orders   CBC (Completed)   Abnormal INR   Relevant Orders   Protime-INR   Other Visit Diagnoses     Hospital discharge follow-up    -  Primary       Meds ordered this encounter  Medications   ondansetron (ZOFRAN-ODT) 4 MG disintegrating tablet    Sig: Take 1 tablet (4 mg total) by mouth every 8 (eight) hours as needed for nausea or vomiting.    Dispense:  30 tablet    Refill:  1    Order Specific Question:   Supervising Provider    Answer:   Beatrice Lecher D [[id number]]     Follow-up: Return for as scheduled.    Ronnell Freshwater, NP

## 2021-07-28 ENCOUNTER — Telehealth: Payer: Self-pay | Admitting: Physician Assistant

## 2021-07-28 LAB — COMPREHENSIVE METABOLIC PANEL WITH GFR
ALT: 99 IU/L — ABNORMAL HIGH (ref 0–44)
AST: 92 IU/L — ABNORMAL HIGH (ref 0–40)
Albumin/Globulin Ratio: 1.4 (ref 1.2–2.2)
Albumin: 4.1 g/dL (ref 3.6–4.6)
Alkaline Phosphatase: 460 IU/L — ABNORMAL HIGH (ref 44–121)
BUN/Creatinine Ratio: 18 (ref 10–24)
BUN: 21 mg/dL (ref 8–27)
Bilirubin Total: 7.8 mg/dL — ABNORMAL HIGH (ref 0.0–1.2)
CO2: 26 mmol/L (ref 20–29)
Calcium: 9.5 mg/dL (ref 8.6–10.2)
Chloride: 92 mmol/L — ABNORMAL LOW (ref 96–106)
Creatinine, Ser: 1.18 mg/dL (ref 0.76–1.27)
Globulin, Total: 2.9 g/dL (ref 1.5–4.5)
Glucose: 133 mg/dL — ABNORMAL HIGH (ref 70–99)
Potassium: 4.5 mmol/L (ref 3.5–5.2)
Sodium: 135 mmol/L (ref 134–144)
Total Protein: 7 g/dL (ref 6.0–8.5)
eGFR: 62 mL/min/1.73

## 2021-07-28 LAB — CBC
Hematocrit: 39.8 % (ref 37.5–51.0)
Hemoglobin: 13.2 g/dL (ref 13.0–17.7)
MCH: 27.2 pg (ref 26.6–33.0)
MCHC: 33.2 g/dL (ref 31.5–35.7)
MCV: 82 fL (ref 79–97)
Platelets: 356 x10E3/uL (ref 150–450)
RBC: 4.85 x10E6/uL (ref 4.14–5.80)
RDW: 16.5 % — ABNORMAL HIGH (ref 11.6–15.4)
WBC: 8.5 x10E3/uL (ref 3.4–10.8)

## 2021-07-28 LAB — TSH+FREE T4
Free T4: 1.81 ng/dL — ABNORMAL HIGH (ref 0.82–1.77)
TSH: 4.06 u[IU]/mL (ref 0.450–4.500)

## 2021-07-28 NOTE — Telephone Encounter (Signed)
Scheduled appt per 12/13 referral. Called pt, no answer. Left msg with appt date and time. Requested for pt to call me back to confirm appt.

## 2021-07-28 NOTE — Telephone Encounter (Signed)
Spoke to pt's daughter, she is aware of appt date and time.

## 2021-07-29 ENCOUNTER — Telehealth: Payer: Self-pay | Admitting: Gastroenterology

## 2021-07-29 NOTE — Progress Notes (Signed)
[place] [phone]   [phone]  CONSULT NOTE  REFERRING PHYSICIAN: Leretha Pol NP  REASON FOR CONSULTATION:  Suspicious Pancreatic Cancer   HPI Angel Keller. is a 80 y.o. male with a past medical history significant for hypertension, hyperlipidemia, middle cerebral artery aneurysm, GERD, diverticulosis, and hypothyroidism is referred to the clinic for suspicious pancreatic cancer.   The patient establish care with his new primary care provider on 07/02/2021.  The patient reported feeling "lousy" at an appointment. He reported 2 weeks of  dyspnea on exertion and mild cough.  He also had been reporting indigestion.  He was trialed on omeprazole. He continued to have persistent heartburn and his PCP initiated trial pantoprazole 40 mg daily 11/28 without significant relief.   The patient then presented to the emergency room on 07/18/2021 with a chief complaint of 3 weeks of epigastric pain, shortness of breath, early satiety, dyspepsia.  The patient was jaundice upon admission without realizing it. The patient's family reports that he started appearing jaundice around Thanksgiving with scleral icterus 2 days prior to admission. He denies any history of alcohol use, Tylenol, IV drug use, or hepatitis.  He localizes the pain to the xiphoid region and describes it is a nonradiating unsettling/uncomfortable sensation.    A CT scan of the abdomen and pelvis was performed in the emergency room on 07/18/2021 which showed a suspected solid mass within the proximal body of the pancreas measuring 2.3 x 2.6 x 2.0 cm.  There was some downstream dilation of the main pancreatic duct and its branches.  There is also intra and extrahepatic biliary ductal dilation.  Labs on admission showed slightly low hemoglobin at 12.3, creatinine 1.27, albumin 3.2, AST 92, and ALT 134, Alk phos 385, and bilirubin 15.9.  His baseline CA 19.9 was 203.  The patient had an ERCP on  07/20/2021 which was unsuccessful.  The exam was suspicious for biliary stricture in the bile duct and suspicious for carcinoma of the head of the pancreas but attempts at cholangiogram failed.  IR placed a percutaneous drain on 07/22/2021. He is scheduled to see IR again on 09/18/21.  It is documented in the hospital note that home health was offered to the patient as well as education on how to drain the biliary catheter.  He reportedly declined home health due to feeling like he could drain it himself.  The patient and his daughter do not have any recollection of this discussion.  They reportedly have not drained this since being in the hospital.  They have 1 bag with the biliary drain.  The final pathology ([id number]) is suspicious malignancy with rare cells with cytologic atypia. This is suspicious for adenocarcinoma but not diagnostic.   Overall, the patient feels "the same" and "no better but no worse".  He still reports some shortness of breath.  He also reports persistent indigestion/heartburn but he states that he is stubborn and does not like taking pills and only resumed taking his Protonix 2 days ago.  He rates his indigestion/epigastric discomfort as an 8 out of 10.  He was given a prescription for oxycodone at the hospital but only took 1 tablet upon returning home.  He thinks it may have helped.  He continues to endorse fatigue.  He also reports some nausea without vomiting.  His primary care provider gave him prescription for Zofran a few days ago but he is not taking that at this time.  His last bowel  movement was last night but reports some constipation.  He was encouraged to take MiraLAX previously.  Over the last 3 weeks, he estimates that he has lost close to 20 pounds.  His daughter is going to pick up some Ensure on the way home today.  He has some dark urine but this is improving.  He denies any fever, chills, or night sweats.  Denies any chest pain or significant cough.  The patient  reports that his family history consists of a mother who had metastatic breast cancer in her 17s.  The patient's father passed away secondary to tuberculosis.  The patient sister passed away secondary to liver cirrhosis.  He denies any other family history of cancer.  Up until recently when he "got sick", the patient was very active.  He went to the gym 3 times a week and did weight exercises.  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  Lives with his daughter, Angel Keller.  The patient is accompanied by his daughter Angel Keller today.  Angel Keller lives less than 10 minutes away from the patient.  He has 2 other living children.  He is widowed.  He used to work for an [employer].  The patient reports a brief smoking history less than a pack a day of cigarettes for only a few months in the year 1963.  He also quit drinking alcohol in the 1960s.  Denies any street drug use.  The patient states that he does not use any assistive devices for ambulation and he is independent with his activities of daily living, although he has been more lightheaded with standing too quickly lately which has slowed him down from his baseline.    Past Medical History:  Diagnosis Date   Aneurysm ([place])    Anxiety    Arthritis    Depression    Headache    HLD (hyperlipidemia)    Hx of inguinal hernia repair    Hypertension    Hypothyroidism    Overactive bladder    Pneumonia    Shingles    Thyroid disease     Past Surgical History:  Procedure Laterality Date   BILATERAL CARPAL TUNNEL RELEASE     CATARACT EXTRACTION, BILATERAL Bilateral    ELBOW SURGERY Bilateral    ENDOSCOPIC RETROGRADE CHOLANGIOPANCREATOGRAPHY (ERCP) WITH PROPOFOL N/A 07/20/2021   Procedure: ENDOSCOPIC RETROGRADE CHOLANGIOPANCREATOGRAPHY (ERCP) WITH PROPOFOL;  Surgeon: Gatha Mayer, MD;  Location: [facility] ENDOSCOPY;  Service: Endoscopy;  Laterality: N/A;   INGUINAL HERNIA REPAIR Right 1951   IR ANGIO INTRA EXTRACRAN SEL COM  CAROTID INNOMINATE BILAT MOD SED  02/20/2019   IR ENDOLUMINAL BX OF BILIARY TREE  07/22/2021   IR GENERIC HISTORICAL  10/28/2016   IR RADIOLOGIST EVAL & MGMT 10/28/2016 [facility]   IR INT EXT BILIARY DRAIN WITH CHOLANGIOGRAM  07/22/2021   RADIOLOGY WITH ANESTHESIA N/A 10/21/2015   Procedure: RADIOLOGY WITH ANESTHESIA;  Surgeon: Luanne Bras, MD;  Location: [place];  Service: Radiology;  Laterality: N/A;   RADIOLOGY WITH ANESTHESIA N/A 02/20/2019   Procedure: [employer];  Surgeon: Luanne Bras, MD;  Location: [place];  Service: Radiology;  Laterality: N/A;   ROTATOR CUFF REPAIR Bilateral    VARICOSE VEIN SURGERY      Family History  Problem Relation Age of Onset   Breast cancer Mother    Lung cancer Father    Alcoholism Sister    Cirrhosis Sister    Colon cancer Neg Hx  Esophageal cancer Neg Hx    Liver cancer Neg Hx    Pancreatic cancer Neg Hx    Prostate cancer Neg Hx     Social History Social History   Tobacco Use   Smoking status: Former    Packs/day: 0.25    Years: 1.00    Pack years: 0.25    Types: Cigarettes    Quit date: 08/15/1961    Years since quitting: 59.9   Smokeless tobacco: Never  Vaping Use   Vaping Use: Never used  Substance Use Topics   Alcohol use: No   Drug use: No    No Known Allergies  Current Outpatient Medications  Medication Sig Dispense Refill   aspirin EC 81 MG tablet Take 1 tablet (81 mg total) by mouth daily. Hold while on blood thinner/eliquis for 3 months, then resume (Patient taking differently: Take 81 mg by mouth daily.) 30 tablet 11   bisoprolol-hydrochlorothiazide (ZIAC) 2.5-6.25 MG tablet Take 1 tablet by mouth every morning.     Cholecalciferol (VITAMIN D3) 50 MCG (2000 UT) TABS Take 2,000 Units by mouth daily.     escitalopram (LEXAPRO) 20 MG tablet Take 20 mg by mouth daily.     ondansetron (ZOFRAN-ODT) 4 MG disintegrating tablet Take 1 tablet (4 mg total) by mouth every 8 (eight) hours as needed for nausea or vomiting. 30  tablet 1   oxyCODONE (OXY IR/ROXICODONE) 5 MG immediate release tablet Take 1 tablet (5 mg total) by mouth every 6 (six) hours as needed for moderate pain. 10 tablet 0   pantoprazole (PROTONIX) 40 MG tablet Take 1 tablet (40 mg total) by mouth daily. 30 tablet 2   SYNTHROID 112 MCG tablet Take 112 mcg by mouth daily before breakfast.     vitamin B-12 (CYANOCOBALAMIN) 1000 MCG tablet Take 2,000 mcg by mouth daily.     No current facility-administered medications for this visit.    REVIEW OF SYSTEMS:   Review of Systems  Constitutional: Positive for fatigue, decreased appetite, and weight loss.  Negative for appetite change, chills, and fever.  HENT: Negative for mouth sores, nosebleeds, sore throat and trouble swallowing.   Eyes: Positive for scleral icterus.  Respiratory: Positive for shortness of breath.  Negative for cough, hemoptysis,  and wheezing.   Cardiovascular: Negative for chest pain and leg swelling.  Gastrointestinal: Positive for epigastric discomfort.  Positive for mild nausea.  Positive for occasional constipation.  Negative for diarrhea and vomiting.  Genitourinary:  Positive for dark urine (improving compared to prior ). Negative for bladder incontinence, difficulty urinating, dysuria, frequency and hematuria.   Musculoskeletal: Negative for back pain, gait problem, neck pain and neck stiffness.  Skin: Negative for itching and rash.  Positive for jaundice. Neurological: Negative for dizziness, extremity weakness, gait problem, headaches, light-headedness and seizures.  Hematological: Negative for adenopathy. Does not bruise/bleed easily.  Psychiatric/Behavioral: Negative for confusion, depression and sleep disturbance. The patient is not nervous/anxious.     PHYSICAL EXAMINATION:  Blood pressure 114/66, pulse 61, temperature 98.2 F (36.8 C), temperature source Tympanic, resp. rate 18, height '5\' 10"'  (1.778 m), weight 154 lb 12.8 oz (70.2 kg), SpO2 100 %.  ECOG  PERFORMANCE STATUS: 1-2  Physical Exam  Constitutional: Oriented to person, place, and time and thin appearing male and in no distress.  HENT:  Head: Normocephalic and atraumatic.  Mouth/Throat: Oropharynx is clear and moist. No oropharyngeal exudate.  Eyes: Conjunctivae are normal. Right eye exhibits no discharge. Left eye exhibits no discharge.  Scleral  icterus noted Neck: Normal range of motion. Neck supple.  Cardiovascular: Bradycardic, regular rhythm, normal heart sounds and intact distal pulses.   Pulmonary/Chest: Effort normal and breath sounds normal. No respiratory distress. No wheezes. No rales.  Abdominal: Soft. Bowel sounds are normal. Exhibits no distension and no mass. There is no tenderness.  Biliary drainage tube in place.Tube capped.  Musculoskeletal: Normal range of motion. Exhibits no edema.  Lymphadenopathy:    No cervical adenopathy.  Neurological: Alert and oriented to person, place, and time. Exhibits muscle wasting. Gait normal. Coordination normal.  Skin: Patient is visibly jaundiced. skin is warm and dry. No rash noted. Not diaphoretic. No erythema. No pallor.  Psychiatric: Mood, memory and judgment normal.  Vitals reviewed.  LABORATORY DATA: Lab Results  Component Value Date   WBC 8.5 07/27/2021   HGB 13.2 07/27/2021   HCT 39.8 07/27/2021   MCV 82 07/27/2021   PLT 356 07/27/2021      Chemistry      Component Value Date/Time   NA 135 07/27/2021 1627   K 4.5 07/27/2021 1627   CL 92 (L) 07/27/2021 1627   CO2 26 07/27/2021 1627   BUN 21 07/27/2021 1627   CREATININE 1.18 07/27/2021 1627      Component Value Date/Time   CALCIUM 9.5 07/27/2021 1627   ALKPHOS 460 (H) 07/27/2021 1627   AST 92 (H) 07/27/2021 1627   ALT 99 (H) 07/27/2021 1627   BILITOT 7.8 (H) 07/27/2021 1627       RADIOGRAPHIC STUDIES: DG Chest 2 View  Result Date: 07/18/2021 CLINICAL DATA:  Epigastric pain and nausea. EXAM: CHEST - 2 VIEW COMPARISON:  July 27, 2020 FINDINGS:  Tortuosity of the aorta. Cardiomediastinal silhouette is normal. Mediastinal contours appear intact. Linear peribronchial opacities in bilateral lung bases, left greater than right. Osseous structures are without acute abnormality. Soft tissues are grossly normal. IMPRESSION: Linear peribronchial opacities in bilateral lung bases, left greater than right. This may represent atelectasis, bronchitic changes or atypical pneumonia. Electronically Signed   By: Fidela Salisbury M.D.   On: 07/18/2021 13:50   CT Abdomen Pelvis W Contrast  Result Date: 07/18/2021 CLINICAL DATA:  Epigastric pain. EXAM: CT ABDOMEN AND PELVIS WITH CONTRAST TECHNIQUE: Multidetector CT imaging of the abdomen and pelvis was performed using the standard protocol following bolus administration of intravenous contrast. 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 Hepatobiliary: Intrahepatic and extrahepatic biliary ductal dilation. Otherwise normal attenuation of the liver. The gallbladder is normally distended. The extrahepatic common bile duct measures 1.4 cm. Pancreas: Suspected hypoattenuated mass within the proximal body of the pancreas measures 2.3 x 2.6 x 2.0 cm. There is a downstream dilation of the main pancreatic duct and its branches. It is likely the cause for the biliary ductal dilation as well as it compresses the ampulla. Spleen: Normal in size without focal abnormality. Adrenals/Urinary Tract: Adrenal glands are unremarkable. Kidneys are normal, without renal calculi, focal lesion, or hydronephrosis. Bladder is unremarkable. Stomach/Bowel: Stomach is within normal limits. No evidence of bowel wall thickening, distention, or inflammatory changes. Vascular/Lymphatic: Aortic atherosclerosis. No enlarged abdominal or pelvic lymph nodes. Reproductive: Prostate is unremarkable. Other: No abdominal wall hernia or  abnormality. No abdominopelvic ascites. Musculoskeletal: Spondylosis of the thoracolumbar spine. IMPRESSION: 1. Suspected solid mass within the proximal body of the pancreas measures 2.3 x 2.6 x 2.0 cm. There is downstream dilation of the main pancreatic duct  and its branches. There is also intra and extrahepatic biliary ductal dilation. Further evaluation with MRI of the abdomen, pancreatic protocol, when clinically feasible, may be considered. 2. 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 3. Aortic atherosclerosis. Aortic Atherosclerosis (ICD10-I70.0). These results were called by telephone at the time of interpretation on 07/18/2021 at 4:03 pm to provider Carmin Muskrat , who verbally acknowledged these results. Electronically Signed   By: Fidela Salisbury M.D.   On: 07/18/2021 16:03   DG ERCP  Result Date: 07/20/2021 CLINICAL DATA:  Pancreatic mass.  ERCP. EXAM: ERCP TECHNIQUE: Multiple spot images obtained with the fluoroscopic device and submitted for interpretation post-procedure. FLUOROSCOPY TIME:  1 minute, 58 seconds (23.6 mGy) COMPARISON:  CT abdomen and pelvis - 07/18/2021 FINDINGS: Single spot intraoperative fluoroscopic image of the right upper abdominal quadrant is provided for review. There is no definitive opacification the CBD or pancreatic duct. IMPRESSION: Nondiagnostic ERCP as above. Correlation with the operative report is advised. These images were submitted for radiologic interpretation only. Please see the procedural report for the amount of contrast and the fluoroscopy time utilized. Electronically Signed   By: Sandi Mariscal M.D.   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 He presents for percutaneous transhepatic cholangiogram, percutaneous biliary drain placement and brush biopsy if possible. EXAM: IR ENDOLUMINAL  BIOPSY OF BILIARY TREE; IR INT-EXT BILIARY DRAIN W/ CHOLANGIOGRAM MEDICATIONS: In patient currently on antibiotics. ANESTHESIA/SEDATION: Moderate (conscious) sedation was employed during this procedure. A total of Versed 2 mg and Fentanyl 150 mcg was administered intravenously. Moderate Sedation Time: 33 minutes. The patient's level of consciousness and vital signs were monitored continuously by radiology nursing throughout the procedure under my direct supervision. FLUOROSCOPY TIME:  Fluoroscopy Time: 8 minutes 6 seconds (68 mGy). COMPLICATIONS: None immediate. PROCEDURE: Informed written consent was obtained from the patient. After a thorough discussion of the procedural risks, benefits and alternatives. All questions were addressed. Maximal Sterile Barrier Technique was utilized including caps, mask, sterile gowns, sterile gloves, sterile drape, hand hygiene and skin antiseptic. A timeout was performed prior to the initiation of the procedure. the liver was interrogated with ultrasound. Intrahepatic biliary ductal dilatation is visualized. A suitable peripheral radicle in the right hemi liver was identified. Local anesthesia was attained by infiltration with 1% lidocaine. A small dermatotomy was made. Under real-time ultrasound guidance, a 21 gauge trocar needle was used to puncture the peripheral biliary radicle. A transhepatic percutaneous cholangiogram was then performed confirming good access of the biliary tree. There is marked intra and extrahepatic biliary ductal dilatation. The cystic duct is patent. Contrast opacifies the gallbladder. Both the right and left ducts are opacified. There is complete occlusion of the common bile duct distally. A 0.018 wire was advanced into the common bile duct. The soft tissue tract was dilated utilizing the transitional sheath. An angled 4 French glide catheter and roadrunner wire were then advanced coaxially and used to successfully navigate through the obstructed distal  common bile duct and into the duodenum. The road runner wire was exchanged for an Amplatz wire. The transitional sheath was exchanged for an 8 [place] Brite tip sheath which was advanced into the distal common bile duct just proximal to the stenosis. Brush biopsy was then performed 3 times. Specimens were placed in both formalin and saline and delivered to pathology for further analysis. The 8 French sheath was removed. A  10 French internal/external biliary drainage catheter was then advanced over the wire and formed with the locking loop in the duodenum. Contrast injection confirms excellent placement and adequate drainage. The catheter was connected to gravity bag drainage and secured to the skin with 0 Prolene suture. The patient tolerated the procedure well. IMPRESSION: 1. 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 2. Successful brush biopsy of obstructing lesion x3. 3. Successful placement of a 10 French internal/external biliary drainage catheter. PLAN: 1. Follow bilirubin. When significant downward trend is clear, the bag should be capped. Recommend capping bag before discharge home if possible. 2. Follow-up in IR in 4-6 weeks for initial biliary tube check and exchange. If initial brush biopsies are negative, repeat biopsy could be considered at that time. Signed, Criselda Peaches, MD, [facility] Electronically Signed   By: Jacqulynn Cadet M.D.   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 He presents for percutaneous transhepatic cholangiogram, percutaneous biliary drain placement and brush biopsy if possible. EXAM: IR ENDOLUMINAL BIOPSY OF BILIARY TREE; IR INT-EXT BILIARY DRAIN W/ CHOLANGIOGRAM MEDICATIONS: In patient currently on  antibiotics. ANESTHESIA/SEDATION: Moderate (conscious) sedation was employed during this procedure. A total of Versed 2 mg and Fentanyl 150 mcg was administered intravenously. Moderate Sedation Time: 33 minutes. The patient's level of consciousness and vital signs were monitored continuously by radiology nursing throughout the procedure under my direct supervision. FLUOROSCOPY TIME:  Fluoroscopy Time: 8 minutes 6 seconds (68 mGy). COMPLICATIONS: None immediate. PROCEDURE: Informed written consent was obtained from the patient. After a thorough discussion of the procedural risks, benefits and alternatives. All questions were addressed. Maximal Sterile Barrier Technique was utilized including caps, mask, sterile gowns, sterile gloves, sterile drape, hand hygiene and skin antiseptic. A timeout was performed prior to the initiation of the procedure. the liver was interrogated with ultrasound. Intrahepatic biliary ductal dilatation is visualized. A suitable peripheral radicle in the right hemi liver was identified. Local anesthesia was attained by infiltration with 1% lidocaine. A small dermatotomy was made. Under real-time ultrasound guidance, a 21 gauge trocar needle was used to puncture the peripheral biliary radicle. A transhepatic percutaneous cholangiogram was then performed confirming good access of the biliary tree. There is marked intra and extrahepatic biliary ductal dilatation. The cystic duct is patent. Contrast opacifies the gallbladder. Both the right and left ducts are opacified. There is complete occlusion of the common bile duct distally. A 0.018 wire was advanced into the common bile duct. The soft tissue tract was dilated utilizing the transitional sheath. An angled 4 French glide catheter and roadrunner wire were then advanced coaxially and used to successfully navigate through the obstructed distal common bile duct and into the duodenum. The road runner wire was exchanged for an Amplatz wire. The  transitional sheath was exchanged for an 8 [place] Brite tip sheath which was advanced into the distal common bile duct just proximal to the stenosis. Brush biopsy was then performed 3 times. Specimens were placed in both formalin and saline and delivered to pathology for further analysis. The 8 French sheath was removed. A 10 French internal/external biliary drainage catheter was then advanced over the wire and formed with the locking loop in the duodenum. Contrast injection confirms excellent placement and adequate drainage. The catheter was connected to gravity bag drainage and secured to the skin with  0 Prolene suture. The patient tolerated the procedure well. IMPRESSION: 1. 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 2. Successful brush biopsy of obstructing lesion x3. 3. Successful placement of a 10 French internal/external biliary drainage catheter. PLAN: 1. Follow bilirubin. When significant downward trend is clear, the bag should be capped. Recommend capping bag before discharge home if possible. 2. Follow-up in IR in 4-6 weeks for initial biliary tube check and exchange. If initial brush biopsies are negative, repeat biopsy could be considered at that time. Signed, Criselda Peaches, MD, [facility] Radiology Electronically Signed   By: Jacqulynn Cadet M.D.   On: 07/22/2021 17:16    ASSESSMENT: This is a very pleasant 80 year old male with:  Suspicious for pancreatic adenocarcinoma -The patient presented with epigastric pain and obstructive jaundice, bilirubin upon admission 15.9 -07/18/2021 which showed a suspected solid mass within the proximal body of the pancreas measuring 2.3 x 2.6 x 2.0 cm with some downstream dilation of the main pancreatic duct and its branches and intra and extrahepatic biliary ductal dilation.  -Attempted ERCP which was unsuccessful.  Patient has  percutaneous biliary drain. The patient and his daughter have not drained this since being discharged from the hospital. -The final pathology from attempted ERCP shows ([id number]) suspicious for malignancy with rare cells with cytologic atypia suspicious for adenocarcinoma the pancreas, although nondiagnostic. -Baseline CA 19.9 elevated at 203 -The patient was seen with Dr. Burr Medico today.  Dr. Burr Medico had a lengthy discussion with the patient about his current condition and recommended treatment options. -Dr. Burr Medico discussed that this is an aggressive cancer in nature, prognosis, and risk of recurrence.  -The patient requires a CT of the chest to complete the staging work-up.  Additionally, Dr. Burr Medico has reached out to Dr. Zenia Resides from [facility] surgery who recommended a CT abdomen pelvis with pancreatic protocol to better assess if any vascular involvement for positive operative planning.  I have placed the orders.  Dr. Zenia Resides stated she would review the patient's imaging early next week and will be happy to see the patient for consultation.  We will hold off on having the patient have EUS at that this time until the patient can be evaluated by surgery.  Since the biopsies are non diagnostic and as long as he is in whole to undergo surgery and if there is no vascular invasion, surgery mention he may be a candidate for upfront surgery but she will determine this after reviewing his imaging.  I have placed a referral to [place] surgery. -Dr. Burr Medico discussed the role of neoadjuvant or adjuvant chemotherapy.  Dr. Burr Medico briefly discussed that she would consider the patient for the 2 drug regimen (gemcitabine and Abraxane) if Dr. Zenia Resides recommends proceeding with neoadjuvant chemotherapy.  She will discuss this in more detail at a future appointment when the time is appropriate -We will see the patient back for follow-up visit after evaluated by surgery and after he has completed the staging work-up with  imaging. -He had labs performed earlier this week on 07/27/2021 by his PCP.  His bilirubin is downtrending at 7.8.  We will arrange for repeat CBC and CMP before his CT scan next week. -Follow-up after surgery consultation and imaging  2.  Epigastric pain and obstructive jaundice -The patient has a percutaneous biliary drain due to jaundice.  Per hospital notes, the patient declined home health services. -The patient and his daughter have not drained his biliary  drain since being in the hospital.  No significant tenderness to palpation on exam today. -The patient and his daughter were given the number to the [facility] drain clinic for further instructions regarding his external biliary drain. -The patient has oxycodone if needed for pain.  He is only taking 1 tablet since being discharged in the hospital.  Most of his discomfort is related to heartburn/indigestion.  Advised to take Tums and Protonix if needed.  -If he needs to take oxycodone for pain, discussed that he may do so every 6 hours as needed.  If he needs a refill of this, advised to call the clinic for refill. -Reviewed constipation is a common side effect of pain medication and reviewed constipation education with the patient.  3.  Family history of malignancy -Dr. Burr Medico discussed that 5% of pancreatic cancer may have a heritable component which would be important for his family/children -We will refer the patient to genetic counseling  4. Weight loss -The patient estimates that he lost 20 pounds in the last 3 weeks -The patient's daughter is going to pick up Ensure on the way home from the clinic today -Dr. Burr Medico encouraged the patient to have a high-calorie high-protein diet -Referral placed for nutritionist evaluation  PLAN: -CT chest ordered -CT abdomen and pelvis with pancreatic protocol ordered -Referral to central [place] surgery -Referral to nutrition -Referral to genetic counseling -Follow up after surgery evaluation    The patient voices understanding of current disease status and treatment options and is in agreement with the current care plan.  All questions were answered. The patient knows to call the clinic with any problems, questions or concerns. We can certainly see the patient much sooner if necessary.  Thank you so much for allowing me to participate in the care of Angel Keller.. I will continue to follow up the patient with you and assist in his care.  Disclaimer: This note was dictated with voice recognition software. Similar sounding words can inadvertently be transcribed and may not be corrected upon review.   Drexler Maland L Wynette Jersey July 30, 2021, 4:14 PM  Addendum  I have seen the patient, examined him. I agree with the assessment and and plan and have edited the notes.   80 yo male with minimal past medical history and a good baseline health, independent and active, presented with epigastric pain and significant weight loss.  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 Saralyn Pilar today), but not diagnostic.  His staging CT abdomen pelvis was not done with pancreatic protocol, will need to repeat a CT with pancreatic protocol to evaluate vascular involvement, and get a CT chest to complete staging.  The resectability is to be determined.  I discussed the overall high risk of recurrence after surgical resection, and the role of neoadjuvant or adjuvant chemotherapy.  If neoadjuvant chemo is needed, patient would not need EUS for pancreatic mass biopsy for definitive diagnosis, and more tissue for MMR/MSI.  Although he is 84, he still has good performance status, will be a candidate for moderate intensive chemotherapy, such as gemcitabine and Abraxane, and probably a candidate for [facility]  surgery. I will see him back after his CT scan.  I have reached out to pancreatobiliary surgeon Dr. Zenia Resides and Dr. Barry Dienes for their input.  Case will be reviewed in our  GI tumor board after CT scan. All questions were answered.   Truitt Merle MD 07/30/2021

## 2021-07-29 NOTE — Telephone Encounter (Signed)
Patient's daughter, Lattie Haw, called and scheduled an appointment for patient 08/23/21.  She feels he needs to be seen sooner rather than later.  He was discharged from the hospital this past Friday.  He has a tumor on his pancreas, a tube in his bowel duct, and is still very jaundiced.  Can you please call patient's daughter and advise?  Thank you.

## 2021-07-30 ENCOUNTER — Inpatient Hospital Stay: Payer: Medicare HMO | Attending: Physician Assistant | Admitting: Physician Assistant

## 2021-07-30 ENCOUNTER — Ambulatory Visit: Payer: Medicare HMO | Admitting: Physician Assistant

## 2021-07-30 ENCOUNTER — Encounter: Payer: Self-pay | Admitting: Physician Assistant

## 2021-07-30 ENCOUNTER — Telehealth: Payer: Self-pay

## 2021-07-30 ENCOUNTER — Other Ambulatory Visit: Payer: Self-pay

## 2021-07-30 VITALS — BP 114/66 | HR 61 | Temp 98.2°F | Resp 18 | Ht 70.0 in | Wt 154.8 lb

## 2021-07-30 DIAGNOSIS — R0602 Shortness of breath: Secondary | ICD-10-CM | POA: Diagnosis not present

## 2021-07-30 DIAGNOSIS — Z801 Family history of malignant neoplasm of trachea, bronchus and lung: Secondary | ICD-10-CM | POA: Insufficient documentation

## 2021-07-30 DIAGNOSIS — Z87891 Personal history of nicotine dependence: Secondary | ICD-10-CM | POA: Insufficient documentation

## 2021-07-30 DIAGNOSIS — K8689 Other specified diseases of pancreas: Secondary | ICD-10-CM | POA: Diagnosis present

## 2021-07-30 DIAGNOSIS — R634 Abnormal weight loss: Secondary | ICD-10-CM | POA: Insufficient documentation

## 2021-07-30 DIAGNOSIS — R5383 Other fatigue: Secondary | ICD-10-CM | POA: Insufficient documentation

## 2021-07-30 DIAGNOSIS — Z803 Family history of malignant neoplasm of breast: Secondary | ICD-10-CM | POA: Insufficient documentation

## 2021-07-30 DIAGNOSIS — K831 Obstruction of bile duct: Secondary | ICD-10-CM | POA: Diagnosis not present

## 2021-07-30 DIAGNOSIS — R110 Nausea: Secondary | ICD-10-CM | POA: Diagnosis not present

## 2021-07-30 DIAGNOSIS — K5900 Constipation, unspecified: Secondary | ICD-10-CM | POA: Insufficient documentation

## 2021-07-30 NOTE — Telephone Encounter (Signed)
The patient is seeing C Heilingotter, PA-C of oncology today  He has cytology suspicious for cancer so may need additional diagnostic eval depending upon what is decided with oncology  That would be EUS most likely   Jaundice will take a while to go away  Next steps in GI would be procedures - possibly as above   I do not think he needs appointment sooner at this time until we have the next diagnostic +/- therapeutic steps mapped out  He probably will not need the [facility] appointment in January, actually

## 2021-07-30 NOTE — Telephone Encounter (Signed)
-----  Message from Milus Banister, MD sent at 07/30/2021 10:46 AM EST ----- Regarding: RE: EUS [place], let us know if you want, need EUS after you review it next week.   ----- Message ----- From: Dwan Bolt, MD Sent: 07/30/2021  10:44 AM EST To: Milus Banister, MD, Stark Klein, MD, # Subject: RE: EUS                                        Was his CT done with a pancreas protocol? Thats the best imaging for operative planning, if he has already had that an MRI isnt necessary for surgical planning. Im leaving town at the moment and cant look at the imaging, but if biopsies are nondiagnostic and he doesnt have vascular invasion, he is a candidate for up front surgery. I will look at the imaging as soon as I get back Monday morning and am happy to see him. Thanks, Wilburn Cornelia ----- Message ----- From: Truitt Merle, MD Sent: 07/30/2021  10:34 AM EST To: Milus Banister, MD, Stark Klein, MD, # Subject: RE: EUS                                        Dellia Cloud and Chester Holstein,  I discussed with Dr. Saralyn Pilar, his bile duct brushing is suspicious for adenocarcinoma, but not diagnostic. I also need tissue for MMR, could you get him in for EUS and biopsy of his pancreatic cancer? Unless Dorris Fetch or Wilburn Cornelia can take him to OR upfront.   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 He is 80 yo.   Thanks   Krista Blue  ----- Message ----- From: Royston Bake, RN Sent: 07/30/2021   9:09 AM EST To: Timothy Lasso, RN, Truitt Merle, MD, # Subject: EUS                                            Chong Sicilian,  We are seeing Mr Mcelreath this afternoon for a consult.  What is the status of his EUS?  Thanks  KB

## 2021-07-30 NOTE — Telephone Encounter (Signed)
Dr. Carlean Purl,  This pt POT appears complex and certainly ongoing with many providers involved in his care. Given pt dtr concern, does this pt f/u appt need to be expedited or is 08/23/21 appropriate. Dr. Tarri Glenn does not have any availabilities sooner than 1/9 and if needing to be expedited, will likely need to be scheduled with an APP. Please advise.

## 2021-07-30 NOTE — Patient Instructions (Signed)
Summary: -It is nice meeting both of you today.  I know we covered a lot of important information at this visit today.  Here is a summary of the main take away points: -When they did the seizure in the hospital and attempted for biopsy, a pathologist will look at this under microscope.  They looked at it under the microscope, it looks like the sample was suspicious for pancreatic cancer but not confirmatory. -A surgeon may be able to take this out depending on the location, blood vessel involvement, and her overall health.  We will refer you to a surgeon, likely Dr. Zenia Resides, at [facility] surgery for evaluation.  Sometimes the surgeon may like you to get chemotherapy before or after surgery.  Additionally, we discussed your case and reviewed the imaging with multiple different types of doctors including surgeons, medical oncologist, radiation doctors, gastrointestinal doctors, etc.  All of these different doctors come up with a consensus of the best treatment option for you.  Imaging: -There is still some work to be done with scans before determining what treatment is the best for you.  We need to do a scan of your chest/lungs to ensure that none of the presumed cancer has spread to the lungs.  Additionally, we need to rescan the abdomen and pelvis with a certain protocol that helps the surgeon see your blood vessels better.  We are hoping to have this scan done next week.  Santiago Glad will be working behind the scenes to ensure that your scan gets scheduled appropriately, however, if you need to call the radiology/scanning department for some reason, there [phone]  Referrals: -I have placed an urgent referral to [place] surgery to meet with the surgeon. -I have also referred you to a member the nutritionist team.  Please ensure that you try to drink boost or Ensure.  The goal is to eat high-calorie/high-protein foods -I have also referred you to a genetic counselor as 5% of pancreatic  cancers may be heritable.  This would be important information for your family/children regarding their cancer risk  Medication: -Please call our clinic [phone] if you need a refill of your pain medication.  This is to be taken every 6 hours if needed for significant pain.  Continue to take your Protonix.   Follow up: Santiago Glad will arrange for follow-up visit after we have the imaging and after the surgeon has assessed you.  We would like to do lab work before your scan to recheck your liver labs and bilirubin  Please make sure that you follow-up with interventional radiology regarding draining from the catheter.  Santiago Glad has provided you with the number.

## 2021-07-30 NOTE — Telephone Encounter (Signed)
Called pt dtr and informed her of Dr. Celesta Aver response. For now we will plan to keep appt as scheduled and will await further direction by general surgery. It is possible that this appt may need to be canceled and rescheduled to a later date. Verbalized acceptance and understanding.

## 2021-07-30 NOTE — Progress Notes (Signed)
I met with Mr Runkles and his daughter, Lattie Haw during his consultation with Cassie Heillingoepter, PA and Dr Burr Medico.  I explained my role as a nurse navigator and provided my contact information.  I provided the number to the [facility] clinic [phone] for further instructions for his external biliary drain.  I explained I will schedule his CT scan of the chest, abdomen, and pelvis and call Lattie Haw with the appt information.  Oral Ct contrast was given to them as well as verbal and written instructions. All questions were answered.  Lattie Haw verbalized understanding.

## 2021-08-02 ENCOUNTER — Telehealth: Payer: Self-pay | Admitting: Physician Assistant

## 2021-08-02 ENCOUNTER — Other Ambulatory Visit: Payer: Self-pay

## 2021-08-02 DIAGNOSIS — K8689 Other specified diseases of pancreas: Secondary | ICD-10-CM

## 2021-08-02 NOTE — Telephone Encounter (Signed)
Mansouraty, Telford Nab., MD  Dwan Bolt, MD; Milus Banister, MD; Royston Bake, RN; Timothy Lasso, RN Cc: Stark Klein, MD; Truitt Merle, MD; Gatha Mayer, MD; Timothy Lasso, RN; Heilingoetter, Cassandra L, PA-C; 1 other SA,  We will work on getting the patient scheduled for EUS.  Lucas Winograd, can you see about adding this patient on for Thursday of this week (at [facility]) so that I can attempt EUS with FNA/FNB of pancreatic mass.  Otherwise please offer the patient next available EUS with DJ or myself.  Please update [place] when able.  Thanks.  GM

## 2021-08-02 NOTE — Telephone Encounter (Signed)
-----  Message from Dwan Bolt, MD sent at 08/02/2021  7:39 AM EST ----- Regarding: RE: EUS I reviewed his imaging, I think it would be helpful to have EUS to obtain a tissue diagnosis. It doesn't look like he has vascular invasion but based on the size of the mass I would prefer for him to have neoadjuvant chemo prior to surgery. Thank you, Wilburn Cornelia   ----- Message ----- From: Milus Banister, MD Sent: 07/30/2021  10:47 AM EST To: Stark Klein, MD, Dwan Bolt, MD, # Subject: RE: EUS                                        Osvaldo Shipper, let us know if you want, need EUS after you review it next week.   ----- Message ----- From: Dwan Bolt, MD Sent: 07/30/2021  10:44 AM EST To: Milus Banister, MD, Stark Klein, MD, # Subject: RE: EUS                                        Was his CT done with a pancreas protocol? Thats the best imaging for operative planning, if he has already had that an MRI isnt necessary for surgical planning. Im leaving town at the moment and cant look at the imaging, but if biopsies are nondiagnostic and he doesnt have vascular invasion, he is a candidate for up front surgery. I will look at the imaging as soon as I get back Monday morning and am happy to see him. Thanks, Wilburn Cornelia ----- Message ----- From: Truitt Merle, MD Sent: 07/30/2021  10:34 AM EST To: Milus Banister, MD, Stark Klein, MD, # Subject: RE: EUS                                        Dellia Cloud and Chester Holstein,  I discussed with Dr. Saralyn Pilar, his bile duct brushing is suspicious for adenocarcinoma, but not diagnostic. I also need tissue for MMR, could you get him in for EUS and biopsy of his pancreatic cancer? Unless Dorris Fetch or Wilburn Cornelia can take him to OR upfront.   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 He is 80 yo.   Thanks   Krista Blue  -----  Message ----- From: Royston Bake, RN Sent: 07/30/2021   9:09 AM EST To: Timothy Lasso, RN, Truitt Merle, MD, # Subject: EUS                                            Chong Sicilian,  We are seeing Mr Xu this afternoon for a consult.  What is the status of his EUS?  Thanks  KB

## 2021-08-02 NOTE — Telephone Encounter (Signed)
Scheduled per sch msg. Called, not able to leave msg. Mailed printout  °

## 2021-08-02 NOTE — Progress Notes (Signed)
Please lett he patient know that his liver enzymes continue to be moderately elevated. He should have appointment with GI in near future. His blood count has improved. Thyroid panel is stable and no changes need to be made to levothyroxine dose at this time. Thanks so much.   -HB

## 2021-08-02 NOTE — Telephone Encounter (Signed)
The pt has been scheduled for 08/05/21 at 10 am at [facility] with GM.  The pt's daughter has been advised and instructed.  She will call if she has any questions or concerns.

## 2021-08-02 NOTE — Telephone Encounter (Signed)
FYI the pt has been scheduled for EUS on 12/22 with GM.

## 2021-08-03 ENCOUNTER — Ambulatory Visit: Payer: Medicare HMO | Admitting: Nurse Practitioner

## 2021-08-03 ENCOUNTER — Other Ambulatory Visit: Payer: Self-pay

## 2021-08-03 ENCOUNTER — Encounter (HOSPITAL_COMMUNITY): Payer: Self-pay | Admitting: Gastroenterology

## 2021-08-03 NOTE — Progress Notes (Signed)
Angel Keller wants  his daughter  to talk with me, patient is very hard of hearing. Angel Keller, daughter states that patient does not complain of shortness of breath at rest, denies chest pain.  Angel Keller wants to take medications after procedure, I encouraged daughter to have patient  take Synthroid.  I instructed patient to shower with antibiotic soap, if it is available.  Dry off with a clean towel. Do not put lotion, powder, cologne or deodorant or makeup.No jewelry or piercings. Men may shave their face and neck. Woman should not shave. No nail polish, artificial or acrylic nails. Wear clean clothes, brush your teeth. Glasses, contact lens,dentures or partials may not be worn in the OR. If you need to wear them, please bring a case for glasses, do not wear contacts or bring a case, the hospital does not have contact cases, dentures or partials will have to be removed , make sure they are clean, we will provide a denture cup to put them in. You will need some one to drive you home and a responsible person over the age of 74 to stay with you for the first 24 hours after surgery.

## 2021-08-04 NOTE — Progress Notes (Signed)
I spoke with Angel Keller' daughter Angel Keller.  I reviewed Ct scan and lab date time, location, and instructions as well as f/u appt with Dr Burr Medico.  She verbalized understanding.

## 2021-08-05 ENCOUNTER — Ambulatory Visit (HOSPITAL_COMMUNITY): Payer: Medicare HMO | Admitting: Anesthesiology

## 2021-08-05 ENCOUNTER — Ambulatory Visit (HOSPITAL_COMMUNITY)
Admission: RE | Admit: 2021-08-05 | Discharge: 2021-08-05 | Disposition: A | Discharge status: Home / Self Care | Payer: Medicare HMO | Attending: Gastroenterology | Admitting: Gastroenterology

## 2021-08-05 ENCOUNTER — Encounter (HOSPITAL_COMMUNITY): Payer: Self-pay | Admitting: Gastroenterology

## 2021-08-05 ENCOUNTER — Encounter (HOSPITAL_COMMUNITY)
Admission: RE | Disposition: A | Discharge status: Home / Self Care | Payer: Self-pay | Source: Home / Self Care | Attending: Gastroenterology

## 2021-08-05 ENCOUNTER — Other Ambulatory Visit: Payer: Self-pay

## 2021-08-05 ENCOUNTER — Telehealth: Payer: Self-pay | Admitting: Gastroenterology

## 2021-08-05 DIAGNOSIS — C250 Malignant neoplasm of head of pancreas: Secondary | ICD-10-CM | POA: Diagnosis present

## 2021-08-05 DIAGNOSIS — K7689 Other specified diseases of liver: Secondary | ICD-10-CM | POA: Diagnosis not present

## 2021-08-05 DIAGNOSIS — G4730 Sleep apnea, unspecified: Secondary | ICD-10-CM | POA: Insufficient documentation

## 2021-08-05 DIAGNOSIS — I10 Essential (primary) hypertension: Secondary | ICD-10-CM | POA: Diagnosis not present

## 2021-08-05 DIAGNOSIS — E039 Hypothyroidism, unspecified: Secondary | ICD-10-CM | POA: Diagnosis not present

## 2021-08-05 DIAGNOSIS — F32A Depression, unspecified: Secondary | ICD-10-CM | POA: Insufficient documentation

## 2021-08-05 DIAGNOSIS — M1990 Unspecified osteoarthritis, unspecified site: Secondary | ICD-10-CM | POA: Insufficient documentation

## 2021-08-05 DIAGNOSIS — K8689 Other specified diseases of pancreas: Secondary | ICD-10-CM

## 2021-08-05 DIAGNOSIS — K3189 Other diseases of stomach and duodenum: Secondary | ICD-10-CM | POA: Insufficient documentation

## 2021-08-05 DIAGNOSIS — F419 Anxiety disorder, unspecified: Secondary | ICD-10-CM | POA: Diagnosis not present

## 2021-08-05 DIAGNOSIS — K869 Disease of pancreas, unspecified: Secondary | ICD-10-CM

## 2021-08-05 DIAGNOSIS — Z9689 Presence of other specified functional implants: Secondary | ICD-10-CM | POA: Diagnosis not present

## 2021-08-05 DIAGNOSIS — R519 Headache, unspecified: Secondary | ICD-10-CM | POA: Insufficient documentation

## 2021-08-05 DIAGNOSIS — Z87891 Personal history of nicotine dependence: Secondary | ICD-10-CM | POA: Diagnosis not present

## 2021-08-05 DIAGNOSIS — K2289 Other specified disease of esophagus: Secondary | ICD-10-CM | POA: Diagnosis not present

## 2021-08-05 DIAGNOSIS — K831 Obstruction of bile duct: Secondary | ICD-10-CM

## 2021-08-05 DIAGNOSIS — K30 Functional dyspepsia: Secondary | ICD-10-CM | POA: Diagnosis not present

## 2021-08-05 DIAGNOSIS — K219 Gastro-esophageal reflux disease without esophagitis: Secondary | ICD-10-CM | POA: Diagnosis not present

## 2021-08-05 HISTORY — DX: Sleep apnea, unspecified: G47.30

## 2021-08-05 HISTORY — PX: ESOPHAGOGASTRODUODENOSCOPY (EGD) WITH PROPOFOL: SHX5813

## 2021-08-05 HISTORY — PX: EUS: SHX5427

## 2021-08-05 HISTORY — DX: Asymptomatic varicose veins of bilateral lower extremities: I83.93

## 2021-08-05 HISTORY — PX: FINE NEEDLE ASPIRATION: SHX5430

## 2021-08-05 SURGERY — UPPER ENDOSCOPIC ULTRASOUND (EUS) RADIAL
Anesthesia: General

## 2021-08-05 MED ORDER — SODIUM CHLORIDE 0.9 % IV SOLN: 3.0000 g | Freq: Once | INTRAVENOUS | Status: DC

## 2021-08-05 MED ORDER — AMISULPRIDE (ANTIEMETIC) 5 MG/2ML IV SOLN: 10.0000 mg | Freq: Once | INTRAVENOUS | Status: DC | PRN

## 2021-08-05 MED ORDER — OXYCODONE HCL 5 MG/5ML PO SOLN: 5.0000 mg | Freq: Once | ORAL | Status: DC | PRN

## 2021-08-05 MED ORDER — EPHEDRINE SULFATE-NACL 50-0.9 MG/10ML-% IV SOSY
PREFILLED_SYRINGE | INTRAVENOUS | Status: DC | PRN
  Administered 2021-08-05: 5 mg via INTRAVENOUS

## 2021-08-05 MED ORDER — SODIUM CHLORIDE 0.9 % IV SOLN: INTRAVENOUS | Status: DC

## 2021-08-05 MED ORDER — PROPOFOL 500 MG/50ML IV EMUL
INTRAVENOUS | Status: DC | PRN
  Administered 2021-08-05: 100 ug/kg/min via INTRAVENOUS

## 2021-08-05 MED ORDER — FENTANYL CITRATE (PF) 100 MCG/2ML IJ SOLN
INTRAMUSCULAR | Status: DC | PRN
  Administered 2021-08-05: 50 ug via INTRAVENOUS

## 2021-08-05 MED ORDER — SUCRALFATE 1 GM/10ML PO SUSP: 1.0000 g | Freq: Two times a day (BID) | ORAL | 2 refills | Status: DC

## 2021-08-05 MED ORDER — LACTATED RINGERS IV SOLN
INTRAVENOUS | Status: DC
Start: 2021-08-05 — End: 2021-08-05

## 2021-08-05 MED ORDER — OXYCODONE HCL 5 MG PO TABS: 5.0000 mg | ORAL_TABLET | Freq: Once | ORAL | Status: DC | PRN

## 2021-08-05 MED ORDER — PROMETHAZINE HCL 25 MG/ML IJ SOLN: 6.2500 mg | INTRAMUSCULAR | Status: DC | PRN

## 2021-08-05 MED ORDER — PROPOFOL 10 MG/ML IV BOLUS
INTRAVENOUS | Status: DC | PRN
  Administered 2021-08-05: 20 mg via INTRAVENOUS

## 2021-08-05 MED ORDER — HYDROMORPHONE HCL 1 MG/ML IJ SOLN: 0.2500 mg | INTRAMUSCULAR | Status: DC | PRN

## 2021-08-05 MED ORDER — INDOMETHACIN 50 MG RE SUPP: 100.0000 mg | Freq: Once | RECTAL | Status: DC

## 2021-08-05 NOTE — Anesthesia Preprocedure Evaluation (Addendum)
Anesthesia Evaluation  Patient identified by MRN, date of birth, ID band Patient awake    Reviewed: Allergy & Precautions, NPO status , Patient's Chart, lab work & pertinent test results, reviewed documented beta blocker date and time   History of Anesthesia Complications Negative for: history of anesthetic complications  Airway Mallampati: II  TM Distance: >3 FB Neck ROM: Full    Dental  (+) Dental Advisory Given, Edentulous Upper, Edentulous Lower   Pulmonary shortness of breath, sleep apnea , neg COPD, former smoker,    breath sounds clear to auscultation       Cardiovascular hypertension, Pt. on medications and Pt. on home beta blockers (-) angina(-) Past MI and (-) CHF  Rhythm:Regular     Neuro/Psych  Headaches, PSYCHIATRIC DISORDERS Anxiety Depression    GI/Hepatic GERD  Medicated and Controlled,Lab Results      Component                Value               Date                      ALT                      100 (H)             07/20/2021                AST                      72 (H)              07/20/2021                ALKPHOS                  344 (H)             07/20/2021                BILITOT                  13.2 (H)            07/20/2021              Endo/Other  Hypothyroidism   Renal/GU Lab Results      Component                Value               Date                      CREATININE               1.15                07/20/2021                Musculoskeletal  (+) Arthritis , Osteoarthritis,    Abdominal   Peds  Hematology   Anesthesia Other Findings   Reproductive/Obstetrics                             Anesthesia Physical  Anesthesia Plan  ASA: 3  Anesthesia Plan: MAC   Post-op Pain Management:    Induction: Intravenous  PONV Risk Score and Plan: 1 and Ondansetron and Treatment may vary due  to age or medical condition  Airway Management Planned: Nasal  Cannula  Additional Equipment: None  Intra-op Plan:   Post-operative Plan:   Informed Consent: I have reviewed the patients History and Physical, chart, labs and discussed the procedure including the risks, benefits and alternatives for the proposed anesthesia with the patient or authorized representative who has indicated his/her understanding and acceptance.     Dental advisory given  Plan Discussed with: CRNA and Anesthesiologist  Anesthesia Plan Comments:        Anesthesia Quick Evaluation

## 2021-08-05 NOTE — H&P (Signed)
GASTROENTEROLOGY PROCEDURE H&P NOTE   Primary Care Physician: Ronnell Freshwater, NP  HPI: Angel Keller. is a 80 y.o. 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  Patient has issues with chronic indigestion for months that is not helped with PPI therapy.  Past Medical History:  Diagnosis Date   Aneurysm ([place])    2017   Anxiety    Arthritis    Depression    Headache    HLD (hyperlipidemia)    Hx of inguinal hernia repair    Hypertension    Hypothyroidism    Overactive bladder    Pneumonia    Shingles    Sleep apnea    Thyroid disease    Varicose veins of both lower extremities    Past Surgical History:  Procedure Laterality Date   BILATERAL CARPAL TUNNEL RELEASE     CATARACT EXTRACTION, BILATERAL Bilateral    ELBOW SURGERY Bilateral    ENDOSCOPIC RETROGRADE CHOLANGIOPANCREATOGRAPHY (ERCP) WITH PROPOFOL N/A 07/20/2021   Procedure: ENDOSCOPIC RETROGRADE CHOLANGIOPANCREATOGRAPHY (ERCP) WITH PROPOFOL;  Surgeon: Gatha Mayer, MD;  Location: [facility] ENDOSCOPY;  Service: Endoscopy;  Laterality: N/A;   INGUINAL HERNIA REPAIR Right 1951   IR ANGIO INTRA EXTRACRAN SEL COM CAROTID INNOMINATE BILAT MOD SED  02/20/2019   IR ENDOLUMINAL BX OF BILIARY TREE  07/22/2021   IR GENERIC HISTORICAL  10/28/2016   IR RADIOLOGIST EVAL & MGMT 10/28/2016 [facility]INTER[facility] RAD   IR INT EXT BILIARY DRAIN WITH CHOLANGIOGRAM  07/22/2021   RADIOLOGY WITH ANESTHESIA N/A 10/21/2015   Procedure: RADIOLOGY WITH ANESTHESIA;  Surgeon: Luanne Bras, MD;  Location: [place];  Service: Radiology;  Laterality: N/A;   RADIOLOGY WITH ANESTHESIA N/A 02/20/2019   Procedure: [employer];  Surgeon: Luanne Bras, MD;  Location: [place];  Service: Radiology;  Laterality: N/A;   ROTATOR CUFF REPAIR Bilateral    VARICOSE VEIN SURGERY     Current Facility-Administered Medications   Medication Dose Route Frequency Provider Last Rate Last Admin   0.9 %  sodium chloride infusion   Intravenous Continuous Mansouraty, Telford Nab., MD       amisulpride (BARHEMSYS) injection 10 mg  10 mg Intravenous Once PRN Lynda Rainwater, MD       Ampicillin-Sulbactam (UNASYN) 3 g in sodium chloride 0.9 % 100 mL IVPB  3 g Intravenous Once Gribbin, Sarah J, PA-C       HYDROmorphone (DILAUDID) injection 0.25-0.5 mg  0.25-0.5 mg Intravenous Q5 min PRN Lynda Rainwater, MD       indomethacin (INDOCIN) 50 MG suppository 100 mg  100 mg Rectal Once Vena Rua, PA-C       lactated ringers infusion   Intravenous Continuous Mansouraty, Telford Nab., MD 600 mL/hr at 08/05/21 1028 Rate Change at 08/05/21 1028   oxyCODONE (Oxy IR/ROXICODONE) immediate release tablet 5 mg  5 mg Oral Once PRN Lynda Rainwater, MD       Or   oxyCODONE (ROXICODONE) 5 MG/5ML solution 5 mg  5 mg Oral Once PRN Lynda Rainwater, MD       promethazine [facility]) injection 6.25-12.5 mg  6.25-12.5 mg Intravenous Q15 min PRN Lynda Rainwater, MD       Current Outpatient Medications  Medication Sig Dispense Refill   aspirin EC 81 MG tablet Take 1 tablet (81 mg total) by mouth daily. Hold while on blood thinner/eliquis for 3 months,  then resume (Patient taking differently: Take 81 mg by mouth daily.) 30 tablet 11   bisoprolol-hydrochlorothiazide (ZIAC) 2.5-6.25 MG tablet Take 1 tablet by mouth every morning.     Cholecalciferol (VITAMIN D3) 50 MCG (2000 UT) TABS Take 2,000 Units by mouth daily.     escitalopram (LEXAPRO) 20 MG tablet Take 20 mg by mouth daily.     ondansetron (ZOFRAN-ODT) 4 MG disintegrating tablet Take 1 tablet (4 mg total) by mouth every 8 (eight) hours as needed for nausea or vomiting. 30 tablet 1   oxyCODONE (OXY IR/ROXICODONE) 5 MG immediate release tablet Take 1 tablet (5 mg total) by mouth every 6 (six) hours as needed for moderate pain. 10 tablet 0   pantoprazole (PROTONIX) 40 MG tablet Take 1  tablet (40 mg total) by mouth daily. 30 tablet 2   sucralfate (CARAFATE) 1 GM/10ML suspension Take 10 mLs (1 g total) by mouth 2 (two) times daily. 420 mL 2   SYNTHROID 112 MCG tablet Take 112 mcg by mouth daily before breakfast.     vitamin B-12 (CYANOCOBALAMIN) 1000 MCG tablet Take 2,000 mcg by mouth daily.      Current Facility-Administered Medications:    0.9 %  sodium chloride infusion, , Intravenous, Continuous, Mansouraty, Telford Nab., MD   amisulpride (BARHEMSYS) injection 10 mg, 10 mg, Intravenous, Once PRN, Lynda Rainwater, MD   Ampicillin-Sulbactam (UNASYN) 3 g in sodium chloride 0.9 % 100 mL IVPB, 3 g, Intravenous, Once, Gribbin, Sarah J, PA-C   HYDROmorphone (DILAUDID) injection 0.25-0.5 mg, 0.25-0.5 mg, Intravenous, Q5 min PRN, Lynda Rainwater, MD   indomethacin (INDOCIN) 50 MG suppository 100 mg, 100 mg, Rectal, Once, Gribbin, Sarah J, PA-C   lactated ringers infusion, , Intravenous, Continuous, Mansouraty, Telford Nab., MD, Last Rate: 600 mL/hr at 08/05/21 1028, Rate Change at 08/05/21 1028   oxyCODONE (Oxy IR/ROXICODONE) immediate release tablet 5 mg, 5 mg, Oral, Once PRN **OR** oxyCODONE (ROXICODONE) 5 MG/5ML solution 5 mg, 5 mg, Oral, Once PRN, Lynda Rainwater, MD   promethazine [facility]) injection 6.25-12.5 mg, 6.25-12.5 mg, Intravenous, Q15 min PRN, Lynda Rainwater, MD  Current Outpatient Medications:    aspirin EC 81 MG tablet, Take 1 tablet (81 mg total) by mouth daily. Hold while on blood thinner/eliquis for 3 months, then resume (Patient taking differently: Take 81 mg by mouth daily.), Disp: 30 tablet, Rfl: 11   bisoprolol-hydrochlorothiazide (ZIAC) 2.5-6.25 MG tablet, Take 1 tablet by mouth every morning., Disp: , Rfl:    Cholecalciferol (VITAMIN D3) 50 MCG (2000 UT) TABS, Take 2,000 Units by mouth daily., Disp: , Rfl:    escitalopram (LEXAPRO) 20 MG tablet, Take 20 mg by mouth daily., Disp: , Rfl:    ondansetron (ZOFRAN-ODT) 4 MG disintegrating tablet,  Take 1 tablet (4 mg total) by mouth every 8 (eight) hours as needed for nausea or vomiting., Disp: 30 tablet, Rfl: 1   oxyCODONE (OXY IR/ROXICODONE) 5 MG immediate release tablet, Take 1 tablet (5 mg total) by mouth every 6 (six) hours as needed for moderate pain., Disp: 10 tablet, Rfl: 0   pantoprazole (PROTONIX) 40 MG tablet, Take 1 tablet (40 mg total) by mouth daily., Disp: 30 tablet, Rfl: 2   sucralfate (CARAFATE) 1 GM/10ML suspension, Take 10 mLs (1 g total) by mouth 2 (two) times daily., Disp: 420 mL, Rfl: 2   SYNTHROID 112 MCG tablet, Take 112 mcg by mouth daily before breakfast., Disp: , Rfl:    vitamin B-12 (CYANOCOBALAMIN) 1000 MCG tablet, Take 2,000  mcg by mouth daily., Disp: , Rfl:  No Known Allergies Family History  Problem Relation Age of Onset   Breast cancer Mother    Lung cancer Father    Alcoholism Sister    Cirrhosis Sister    Colon cancer Neg Hx    Esophageal cancer Neg Hx    Liver cancer Neg Hx    Pancreatic cancer Neg Hx    Prostate cancer Neg Hx    Social History   Socioeconomic History   Marital status: Widowed    Spouse name: Not on file   Number of children: 5   Years of education: Not on file   Highest education level: Not on file  Occupational History   Occupation: retired  Tobacco Use   Smoking status: Former    Packs/day: 0.25    Years: 1.00    Pack years: 0.25    Types: Cigarettes    Quit date: 08/15/1961    Years since quitting: 60.0   Smokeless tobacco: Never  Vaping Use   Vaping Use: Never used  Substance and Sexual Activity   Alcohol use: No   Drug use: No   Sexual activity: Not Currently  Other Topics Concern   Not on file  Social History Narrative   Not on file   Social Determinants of Health   Financial Resource Strain: Not on file  Food Insecurity: Not on file  Transportation Needs: Not on file  Physical Activity: Not on file  Stress: Not on file  Social Connections: Not on file  Intimate Partner Violence: Not on file     Physical Exam: Today's Vitals   08/05/21 0848 08/05/21 1033 08/05/21 1048  BP: (!) 163/69 (!) 146/75 (!) 163/77  Pulse: (!) 54 (!) 52 (!) 52  Resp: 20 20 19   Temp: (!) 97.4 F (36.3 C) 97.9 F (36.6 C) 97.9 F (36.6 C)  TempSrc: Temporal    SpO2: 98% 100% 99%  Weight: 70 kg    Height: 5\' 10"  (1.778 m)    PainSc: 0-No pain 0-No pain 0-No pain   Body mass index is 22.14 kg/m. GEN: NAD EYE: Sclerae anicteric ENT: MMM CV: Non-tachycardic GI: Soft, NT/ND NEURO:  Alert & Oriented x 3  Lab Results: No results for input(s): WBC, HGB, HCT, PLT in the last 72 hours. BMET No results for input(s): NA, K, CL, CO2, GLUCOSE, BUN, CREATININE, CALCIUM in the last 72 hours. LFT No results for input(s): PROT, ALBUMIN, AST, ALT, ALKPHOS, BILITOT, BILIDIR, IBILI in the last 72 hours. PT/INR No results for input(s): LABPROT, INR in the last 72 hours.   Impression / Plan: This is a 80 y.o.male who presents for EGD/EUS to evaluate and confirm pancreatic malignancy with recent failed ERCP by other provider requiring PBD placement with brushings suspicious for adenocarcinoma.  Patient has issues with chronic indigestion for months that is not helped with PPI therapy.  The risks of an EUS including intestinal perforation, bleeding, infection, aspiration, and medication effects were discussed as was the possibility it may not give a definitive diagnosis if a biopsy is performed.  When a biopsy of the pancreas is done as part of the EUS, there is an additional risk of pancreatitis at the rate of about 1-2%.  It was explained that procedure related pancreatitis is typically mild, although it can be severe and even life threatening, which is why we do not perform random pancreatic biopsies and only biopsy a lesion/area we feel is concerning enough to warrant the  risk.   The risks and benefits of endoscopic evaluation/treatment were discussed with the patient and/or family; these include but are not  limited to the risk of perforation, infection, bleeding, missed lesions, lack of diagnosis, severe illness requiring hospitalization, as well as anesthesia and sedation related illnesses.  The patient's history has been reviewed, patient examined, no change in status, and deemed stable for procedure.  The patient and/or family is agreeable to proceed.    Justice Britain, MD [facility] Office # [phone]

## 2021-08-05 NOTE — Op Note (Addendum)
[facility] Patient Name: Angel Keller Procedure Date : 08/05/2021 MRN: [id number] Attending MD: Justice Britain , MD Date of Birth: 20-Mar-1941 CSN: [id number] Age: 80 Admit Type: Outpatient Procedure:                Upper EUS Indications:              Abnormal liver function test, Suspected pancreatic                            neoplasm Providers:                Justice Britain, MD, Grace Isaac, RN, Benetta Spar, Technician, Virgilio Belling. Huel Cote, CRNA Referring MD:             Gatha Mayer, MD, Thornton Park MD, MD,                            [place] Verdene Lennert MD, MD, Truitt Merle Medicines:                Monitored Anesthesia Care Complications:            No immediate complications. Estimated Blood Loss:     Estimated blood loss was minimal. Procedure:                Pre-Anesthesia Assessment:                           - Prior to the procedure, a History and Physical                            was performed, and patient medications and                            allergies were reviewed. The patient's tolerance of                            previous anesthesia was also reviewed. The risks                            and benefits of the procedure and the sedation                            options and risks were discussed with the patient.                            All questions were answered, and informed consent                            was obtained. Prior Anticoagulants: The patient has                            taken no previous anticoagulant or antiplatelet  agents except for aspirin. ASA Grade Assessment:                            III - A patient with severe systemic disease. After                            reviewing the risks and benefits, the patient was                            deemed in satisfactory condition to undergo the                            procedure.                            After obtaining informed consent, the endoscope was                            passed under direct vision. Throughout the                            procedure, the patient's blood pressure, pulse, and                            oxygen saturations were monitored continuously. The                            [id number]-[id number] ([id number]) Olympus endoscope was introduced                            through the mouth, and advanced to the second part                            of duodenum. The [id number]-[id number] ([id number]) Olympus                            duodenoscope was introduced through the mouth, and                            advanced to the area of papilla. The [id number]                            ([id number]) Olympus linear ultrasound scope was                            introduced through the mouth, and advanced to the                            duodenum for ultrasound examination from the                            stomach and duodenum. The upper EUS was  accomplished without difficulty. The patient                            tolerated the procedure. Scope In: Scope Out: Findings:      ENDOSCOPIC FINDING: :      No gross lesions were noted in the entire esophagus.      The Z-line was irregular and was found 40 cm from the incisors.      Patchy mildly erythematous mucosa without bleeding was found in the       gastric antrum.      No other gross lesions were noted in the entire examined stomach.      A moderate extrinsic deformity was found in the duodenal bulb.      A percutaneous biliary drainage catheter was found at the major papilla.      ENDOSONOGRAPHIC FINDING: :      An irregular mass was identified in the pancreatic head. The mass was       hypoechoic. The mass measured 38 mm by 28 mm in maximal cross-sectional       diameter. The outer margins were irregular. There was sonographic       evidence suggesting abutment of the likely left gastric artery. An        intact interface was seen between the mass and the superior mesenteric       artery, celiac trunk and hepatic artery suggesting a lack of invasion.       The remainder of the pancreas was examined. The endosonographic       appearance of parenchyma and the upstream pancreatic duct indicated duct       dilation (PDN - 5.4 mm, PDB - 6.0 mm, PDT - 3.2 mm), parenchymal       atrophy, lobularity, and parenchymal calcifications (noted only       distally). Fine needle biopsy was performed. Color Doppler imaging was       utilized prior to needle puncture to confirm a lack of significant       vascular structures within the needle path. Seven passes were made with       the Acquire 22 gauge ultrasound core biopsy needle using a transduodenal       approach. Visible cores of tissue were obtained. Preliminary cytologic       examination and touch preps were performed. Final cytology results are       pending.      There was dilation in the common bile duct which measured up to 15 mm.      Moderate hyperechoic material consistent with sludge was visualized       endosonographically in the common bile duct.      One percutaneous biliary drain was visualized endosonographically in the       common bile duct and in the common hepatic duct with extension into the       duodenum.      A small cyst was found in the visualized portion of the liver and       measured 5 mm by 5 mm in maximal cross-sectional diameter. The cyst was       anechoic. It was without septae.      Endosonographic imaging in the visualized portion of the liver showed no       other mass-lesions.      No malignant-appearing lymph nodes were visualized in the celiac region       (  level 20), peripancreatic region and porta hepatis region.      The celiac region was visualized. Impression:               EGD Impression:                           - No gross lesions in esophagus. Z-line irregular,                            40 cm from the  incisors.                           - Erythematous mucosa in the antrum. No other gross                            lesions in the stomach.                           - Duodenal extrinsic impression in bulb.                           - Percutaneous biliary drain at ampulla.                           EUS Impression:                           - A mass was identified in the pancreatic head.                            Cytology results are pending. However, the                            endosonographic appearance is suspicious for                            adenocarcinoma. This was staged T2 N0 Mx by                            endosonographic criteria. The staging applies if                            malignancy is confirmed. Fine needle biopsy                            performed. See above for arterial and venous                            discussion                           - There was dilation in the common bile duct which                            measured up to 15 mm. Hyperechoic material  consistent with sludge was visualized                            endosonographically in the common bile duct.                            Plastic biliary drain was visualized                            endosonographically in the common bile duct and in                            the common hepatic duct.                           - A small cyst was found in the visualized portion                            of the liver and measured 5 mm by 5 mm but no other                            masses/lesions noted.                           - No malignant-appearing lymph nodes were                            visualized in the celiac region (level 20),                            peripancreatic region and porta hepatis region. Recommendation:           - The patient will be observed post-procedure,                            until all discharge criteria are met.                            - Discharge patient to home.                           - Patient has a contact number available for                            emergencies. The signs and symptoms of potential                            delayed complications were discussed with the                            patient. Return to normal activities tomorrow.                            Written discharge instructions were provided to the  patient.                           - Low fat diet.                           - Monitor for signs/symptoms of pancreatitis,                            bleeding, perforation, and infection. If issues                            please call our number to get further assistance as                            needed.                           - Observe patient's clinical course.                           - Start Carafate twice daily and increase up to                            four times daily. He has been experieincing                            heartburn/indigestion for weeks and has not had                            improvement with PPI twice daily therapy. I suspect                            this is more than just indigestion/functional                            dyspepsia and probably a result of his underlying                            likely malignancy.                           - Continue current medications as prescribed.                           - Await cytology results.                           - The findings and recommendations were discussed                            with the patient.                           - The findings and recommendations were discussed  with the patient's family. Procedure Code(s):        --- Professional ---                           [phone], Esophagogastroduodenoscopy, flexible,                            transoral; with transendoscopic ultrasound-guided                            intramural or transmural  fine needle                            aspiration/biopsy(s), (includes endoscopic                            ultrasound examination limited to the esophagus,                            stomach or duodenum, and adjacent structures) Diagnosis Code(s):        --- Professional ---                           K22.8, Other specified diseases of esophagus                           K31.89, Other diseases of stomach and duodenum                           T18.3XXS, Foreign body in small intestine, sequela                           K86.89, Other specified diseases of pancreas                           K83.8, Other specified diseases of biliary tract                           K76.89, Other specified diseases of liver                           I89.9, Noninfective disorder of lymphatic vessels                            and lymph nodes, unspecified                           R94.5, Abnormal results of liver function studies CPT copyright 2019 American Medical Association. All rights reserved. The codes documented in this report are preliminary and upon coder review may  be revised to meet current compliance requirements. Justice Britain, MD 08/05/2021 10:51:10 AM Number of Addenda: 0

## 2021-08-05 NOTE — Transfer of Care (Signed)
Immediate Anesthesia Transfer of Care Note  Patient: Angel NAVES Sr.  Procedure(s) Performed: UPPER ENDOSCOPIC ULTRASOUND (EUS) RADIAL FINE NEEDLE ASPIRATION (FNA) LINEAR  Patient Location: PACU  Anesthesia Type:MAC  Level of Consciousness: awake, alert  and oriented  Airway & Oxygen Therapy: Patient Spontanous Breathing and Patient connected to nasal cannula oxygen  Post-op Assessment: Report given to RN and Post -op Vital signs reviewed and stable  Post vital signs: Reviewed and stable  Last Vitals:  Vitals Value Taken Time  BP 146/75 08/05/21 1033  Temp    Pulse 52 08/05/21 1036  Resp 20 08/05/21 1036  SpO2 99 % 08/05/21 1036  Vitals shown include unvalidated device data.  Last Pain:  Vitals:   08/05/21 0848  TempSrc: Temporal  PainSc: 0-No pain         Complications: No notable events documented.

## 2021-08-05 NOTE — Anesthesia Postprocedure Evaluation (Signed)
Anesthesia Post Note  Patient: RAN TULLIS Sr.  Procedure(s) Performed: UPPER ENDOSCOPIC ULTRASOUND (EUS) RADIAL FINE NEEDLE ASPIRATION (FNA) LINEAR     Patient location during evaluation: PACU Anesthesia Type: General Level of consciousness: awake and alert Pain management: pain level controlled Vital Signs Assessment: post-procedure vital signs reviewed and stable Respiratory status: spontaneous breathing, nonlabored ventilation and respiratory function stable Cardiovascular status: blood pressure returned to baseline and stable Postop Assessment: no apparent nausea or vomiting Anesthetic complications: no   No notable events documented.  Last Vitals:  Vitals:   08/05/21 1033 08/05/21 1048  BP: (!) 146/75 (!) 163/77  Pulse: (!) 52 (!) 52  Resp: 20 19  Temp: 36.6 C 36.6 C  SpO2: 100% 99%    Last Pain:  Vitals:   08/05/21 1048  TempSrc:   PainSc: 0-No pain                 Lynda Rainwater

## 2021-08-05 NOTE — Anesthesia Procedure Notes (Signed)
Procedure Name: MAC Date/Time: 08/05/2021 9:21 AM Performed by: Mariea Clonts, CRNA Pre-anesthesia Checklist: Patient identified, Emergency Drugs available, Suction available, Patient being monitored and Timeout performed Patient Re-evaluated:Patient Re-evaluated prior to induction Oxygen Delivery Method: Nasal cannula

## 2021-08-05 NOTE — Telephone Encounter (Signed)
Please send Pepcid 20 mg to be used once at bedtime and once at another time during the day. Thanks. GM

## 2021-08-05 NOTE — Telephone Encounter (Signed)
Inbound call from patient daughter states Sucralfate is $95 and patient can not afford it.

## 2021-08-05 NOTE — Telephone Encounter (Signed)
Please send Carafate tablets. Please give them information on how to make it a slurry. This should not be as much of a cost issue. Thanks. GM

## 2021-08-06 ENCOUNTER — Other Ambulatory Visit: Payer: Self-pay

## 2021-08-06 LAB — CYTOLOGY - NON PAP

## 2021-08-06 MED ORDER — FAMOTIDINE 20 MG PO TABS: 20.0000 mg | ORAL_TABLET | Freq: Two times a day (BID) | ORAL | 1 refills | Status: DC

## 2021-08-06 NOTE — Telephone Encounter (Signed)
Left VM for patient's daughter to call back.

## 2021-08-09 ENCOUNTER — Encounter (HOSPITAL_COMMUNITY): Payer: Self-pay | Admitting: Gastroenterology

## 2021-08-09 DIAGNOSIS — R110 Nausea: Secondary | ICD-10-CM | POA: Insufficient documentation

## 2021-08-09 DIAGNOSIS — D509 Iron deficiency anemia, unspecified: Secondary | ICD-10-CM | POA: Insufficient documentation

## 2021-08-09 DIAGNOSIS — R945 Abnormal results of liver function studies: Secondary | ICD-10-CM | POA: Insufficient documentation

## 2021-08-09 DIAGNOSIS — D378 Neoplasm of uncertain behavior of other specified digestive organs: Secondary | ICD-10-CM | POA: Insufficient documentation

## 2021-08-09 DIAGNOSIS — R791 Abnormal coagulation profile: Secondary | ICD-10-CM | POA: Insufficient documentation

## 2021-08-10 ENCOUNTER — Encounter: Payer: Self-pay | Admitting: Gastroenterology

## 2021-08-10 NOTE — Telephone Encounter (Signed)
Called pharmacy and confirmed that patient has picked up the prescription of Pepcid 20mg  BID.  Called and left another message for daughter regarding substitution for Carafate due to cost of suspension and unavailability of tablets with Pepcid BID. Asked her to call with any questions.

## 2021-08-11 ENCOUNTER — Other Ambulatory Visit: Payer: Self-pay

## 2021-08-11 NOTE — Progress Notes (Signed)
The proposed treatment discussed in conference is for discussion purpose only and is not a binding recommendation.  The patients have not been physically examined, or presented with their treatment options.  Therefore, final treatment plans cannot be decided.  

## 2021-08-12 ENCOUNTER — Ambulatory Visit (HOSPITAL_COMMUNITY): Admission: RE | Admit: 2021-08-12 | Payer: Medicare HMO | Source: Ambulatory Visit

## 2021-08-12 ENCOUNTER — Ambulatory Visit (HOSPITAL_COMMUNITY)
Admission: RE | Admit: 2021-08-12 | Discharge: 2021-08-12 | Disposition: A | Discharge status: Home / Self Care | Payer: Medicare HMO | Source: Ambulatory Visit | Attending: Physician Assistant | Admitting: Physician Assistant

## 2021-08-12 ENCOUNTER — Other Ambulatory Visit: Payer: Self-pay

## 2021-08-12 ENCOUNTER — Other Ambulatory Visit (HOSPITAL_COMMUNITY): Payer: Self-pay

## 2021-08-12 ENCOUNTER — Inpatient Hospital Stay: Payer: Medicare HMO

## 2021-08-12 DIAGNOSIS — K8689 Other specified diseases of pancreas: Secondary | ICD-10-CM | POA: Diagnosis present

## 2021-08-12 LAB — CBC WITH DIFFERENTIAL (CANCER CENTER ONLY)
Abs Immature Granulocytes: 0.03 10*3/uL (ref 0.00–0.07)
Basophils Absolute: 0 10*3/uL (ref 0.0–0.1)
Basophils Relative: 0 %
Eosinophils Absolute: 0.3 10*3/uL (ref 0.0–0.5)
Eosinophils Relative: 4 %
HCT: 37.2 % — ABNORMAL LOW (ref 39.0–52.0)
Hemoglobin: 12.3 g/dL — ABNORMAL LOW (ref 13.0–17.0)
Immature Granulocytes: 0 %
Lymphocytes Relative: 40 %
Lymphs Abs: 2.9 10*3/uL (ref 0.7–4.0)
MCH: 26.7 pg (ref 26.0–34.0)
MCHC: 33.1 g/dL (ref 30.0–36.0)
MCV: 80.9 fL (ref 80.0–100.0)
Monocytes Absolute: 0.7 10*3/uL (ref 0.1–1.0)
Monocytes Relative: 10 %
Neutro Abs: 3.3 10*3/uL (ref 1.7–7.7)
Neutrophils Relative %: 46 %
Platelet Count: 217 10*3/uL (ref 150–400)
RBC: 4.6 MIL/uL (ref 4.22–5.81)
RDW: 15.4 % (ref 11.5–15.5)
WBC Count: 7.2 10*3/uL (ref 4.0–10.5)
nRBC: 0 % (ref 0.0–0.2)

## 2021-08-12 LAB — CMP (CANCER CENTER ONLY)
ALT: 36 U/L (ref 0–44)
AST: 35 U/L (ref 15–41)
Albumin: 3.7 g/dL (ref 3.5–5.0)
Alkaline Phosphatase: 176 U/L — ABNORMAL HIGH (ref 38–126)
Anion gap: 8 (ref 5–15)
BUN: 25 mg/dL — ABNORMAL HIGH (ref 8–23)
CO2: 28 mmol/L (ref 22–32)
Calcium: 9.4 mg/dL (ref 8.9–10.3)
Chloride: 99 mmol/L (ref 98–111)
Creatinine: 1.39 mg/dL — ABNORMAL HIGH (ref 0.61–1.24)
GFR, Estimated: 51 mL/min — ABNORMAL LOW (ref >60)
Glucose, Bld: 125 mg/dL — ABNORMAL HIGH (ref 70–99)
Potassium: 4.8 mmol/L (ref 3.5–5.1)
Sodium: 135 mmol/L (ref 135–145)
Total Bilirubin: 3.2 mg/dL — ABNORMAL HIGH (ref 0.3–1.2)
Total Protein: 7 g/dL (ref 6.5–8.1)

## 2021-08-12 MED ORDER — SODIUM CHLORIDE (PF) 0.9 % IJ SOLN
INTRAMUSCULAR | Status: AC
  Filled 2021-08-12: qty 50

## 2021-08-12 MED ORDER — IOHEXOL 350 MG/ML SOLN
80.0000 mL | Freq: Once | INTRAVENOUS | Status: AC | PRN
  Administered 2021-08-12: 16:00:00 80 mL via INTRAVENOUS

## 2021-08-12 MED ORDER — NORMAL SALINE FLUSH 0.9 % IV SOLN
10.0000 mL | Freq: Every day | INTRAVENOUS | 0 refills | Status: DC
  Filled 2021-08-12: qty 300, 30d supply, fill #0

## 2021-08-13 ENCOUNTER — Encounter (HOSPITAL_COMMUNITY): Payer: Self-pay | Admitting: Surgery

## 2021-08-13 ENCOUNTER — Other Ambulatory Visit: Payer: Self-pay

## 2021-08-13 ENCOUNTER — Ambulatory Visit: Payer: Self-pay | Admitting: Surgery

## 2021-08-13 NOTE — Progress Notes (Addendum)
COVID swab appointment: N/A  COVID Vaccine Completed:  Yes x2 Date COVID Vaccine completed: 12-18-19 01-15-20 Has received booster: COVID vaccine manufacturer: Pfizer      Date of COVID positive in last 90 days:  No  PCP - Leretha Pol, NP Cardiologist - N/A  Chest x-ray - 07-18-21 Epic EKG - 07-20-21 Epic Stress Test - N/A ECHO - N/A Cardiac Cath - N/A Pacemaker/ICD device last checked: Spinal Cord Stimulator:  Sleep Study - Denies sleep apnea CPAP -   Fasting Blood Sugar - N/A Checks Blood Sugar _____ times a day  Blood Thinner Instructions: Aspirin Instructions:  ASA 81 mg.  Daughter will check to see if he needs to stop Last Dose:  08-13-21  Activity level:   Current difficulty climbing stairs and doing ADLs due to fatigue, weakness and shortness of breath.    Anesthesia review: Shortness of breath recently with diagnosis of pancreatic cancer.  Hx of cerebral artery aneurysm.    Patient has mild shortness of breath.  Denies fever, cough and chest pain at PAT appointment (completed over the phone)  Patient verbalized understanding of instructions that were given to them at the PAT appointment. Patient was also instructed that they will need to review over the PAT instructions again at home before surgery.

## 2021-08-14 ENCOUNTER — Other Ambulatory Visit: Payer: Self-pay | Admitting: Hematology

## 2021-08-14 DIAGNOSIS — C250 Malignant neoplasm of head of pancreas: Secondary | ICD-10-CM

## 2021-08-14 DIAGNOSIS — C251 Malignant neoplasm of body of pancreas: Secondary | ICD-10-CM | POA: Insufficient documentation

## 2021-08-14 DIAGNOSIS — C259 Malignant neoplasm of pancreas, unspecified: Secondary | ICD-10-CM | POA: Insufficient documentation

## 2021-08-14 MED ORDER — PROCHLORPERAZINE MALEATE 10 MG PO TABS: 10.0000 mg | ORAL_TABLET | Freq: Four times a day (QID) | ORAL | 1 refills | Status: DC | PRN

## 2021-08-14 MED ORDER — ONDANSETRON HCL 8 MG PO TABS: 8.0000 mg | ORAL_TABLET | Freq: Two times a day (BID) | ORAL | 1 refills | Status: DC | PRN

## 2021-08-14 MED ORDER — LIDOCAINE-PRILOCAINE 2.5-2.5 % EX CREA: TOPICAL_CREAM | CUTANEOUS | 3 refills | Status: DC

## 2021-08-14 NOTE — Progress Notes (Signed)
START ON PATHWAY REGIMEN - Pancreatic Adenocarcinoma     A cycle is every 28 days:     Nab-paclitaxel (protein bound)      Gemcitabine   **Always confirm dose/schedule in your pharmacy ordering system**  Patient Characteristics: Preoperative (Clinical Staging), Borderline Resectable, PS = 0,1, BRCA1/2 and PALB2 Mutation Absent/Unknown Therapeutic Status: Preoperative (Clinical Staging) AJCC T Category: cT2 AJCC N Category: cN0 Resectability Status: Borderline Resectable AJCC M Category: cM0 AJCC 8 Stage Grouping: IB ECOG Performance Status: 1 BRCA1/2 Mutation Status: Awaiting Test Results PALB2 Mutation Status: Awaiting Test Results Intent of Therapy: Curative Intent, Discussed with Patient

## 2021-08-17 ENCOUNTER — Encounter (HOSPITAL_COMMUNITY): Payer: Self-pay | Admitting: Surgery

## 2021-08-17 ENCOUNTER — Inpatient Hospital Stay: Payer: Medicare HMO | Attending: Physician Assistant | Admitting: Hematology

## 2021-08-17 ENCOUNTER — Telehealth: Payer: Self-pay | Admitting: Hematology

## 2021-08-17 ENCOUNTER — Telehealth: Payer: Medicare HMO | Admitting: Hematology

## 2021-08-17 ENCOUNTER — Inpatient Hospital Stay: Payer: Medicare HMO | Admitting: Dietician

## 2021-08-17 ENCOUNTER — Encounter: Payer: Self-pay | Admitting: Hematology

## 2021-08-17 DIAGNOSIS — C251 Malignant neoplasm of body of pancreas: Secondary | ICD-10-CM | POA: Diagnosis not present

## 2021-08-17 DIAGNOSIS — C250 Malignant neoplasm of head of pancreas: Secondary | ICD-10-CM | POA: Insufficient documentation

## 2021-08-17 DIAGNOSIS — Z79899 Other long term (current) drug therapy: Secondary | ICD-10-CM | POA: Insufficient documentation

## 2021-08-17 DIAGNOSIS — Z5111 Encounter for antineoplastic chemotherapy: Secondary | ICD-10-CM | POA: Insufficient documentation

## 2021-08-17 NOTE — Progress Notes (Signed)
[place]   Telephone:(336) [phone] Fax:(336) [phone]   Clinic Follow up Note   Patient Care Team: Ronnell Freshwater, NP as PCP - General (Family Medicine) Truitt Merle, MD as Consulting Physician (Oncology) Royston Bake, RN as Oncology Nurse Navigator (Oncology) 08/17/2021  I connected with Angel Keller on 08/17/21 at 12:00 PM EST by telephone and verified that I am speaking with the correct person using two identifiers.   I discussed the limitations, risks, security and privacy concerns of performing an evaluation and management service by telephone and the availability of in person appointments. I also discussed with the patient that there may be a patient responsible charge related to this service. The patient expressed understanding and agreed to proceed.   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 -Pancreatic protocol CT scan showed 3.6 x 2.6 cm pancreatic head mass, partially involving multiple vessels, including SMV, portal vein, and hepatic artery.  This is borderline resectable. -Patient was evaluated by pancreatobiliary surgeon Dr. Zenia Resides, she recommends neoadjuvant chemotherapy, which I agree. -Due to her advanced age, I do not think she is a candidate for FOLFIRINOX.  I recommend gemcitabine and Abraxane on day 1, 8 every 21 days as a neoadjuvant chemo.  If he does not tolerate well, will change to every 2 weeks. --Chemotherapy consent: Side effects including but does not not limited to, fatigue, nausea, vomiting, diarrhea, hair loss, neuropathy, fluid retention, renal and kidney dysfunction, neutropenic fever, needed for blood transfusion,  bleeding, were discussed with patient in great detail. He agrees to proceed. -The goal of chemo is curative -Class tomorrow -Port placement on January 5th, plan to start chemo on January 6  2.  Epigastric pain and obstructive jaundice -Status post PTC, currently not draining -Repeated lab last week on August 12, 2021 bilirubin was 3.2 which is much improved   PLAN -recent biopsy and CT scan findings discussed with patient and his daughter -Plan to start first cycle chemotherapy gemcitabine and Abraxane on 1/6. Due to her advanced age, will reduce Abraxane to 100 mg/m -f/u on C1D8   CHIEF COMPLAINT: f/u pancreatic cancer   SUMMARY OF ONCOLOGIC HISTORY: Oncology History Overview Note   Cancer Staging  Pancreatic cancer [facility]) Staging form: Exocrine Pancreas, AJCC 8th Edition - Clinical stage from 08/05/2021: Stage IB (cT2, cN0, cM0) - Signed by Truitt Merle, MD on 08/17/2021 Stage prefix: Initial diagnosis Total positive nodes: 0     Pancreatic mass  07/18/2021 Initial Diagnosis   Pancreatic mass   07/18/2021 Imaging   CT Abdomen Pelvis W Contrast  IMPRESSION: 1. Suspected solid mass within the proximal body of the pancreas measures 2.3 x 2.6 x 2.0 cm. There is downstream dilation of the main pancreatic duct and its branches. There is also intra and extrahepatic biliary ductal dilation. Further evaluation with MRI of the abdomen, pancreatic protocol, when clinically feasible, may be considered. 2. 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 3. Aortic atherosclerosis   07/20/2021 Procedure   DG ERCP  IMPRESSION: Nondiagnostic ERCP as above. Correlation with the operative report is advised.  - The examination was suspicious for a biliary stricture in the bile duct. - Examination was suspicious for carcinoma of the  head of the pancreas. - Attempts at a cholangiogram failed.   07/22/2021 Pathology Results   CASE: [id number]   FINAL MICROSCOPIC  DIAGNOSIS:  - Suspicious for malignancy  - See comment   DIAGNOSTIC COMMENTS:  There are rare cells with cytologic atypia suspicious for  adenocarcinoma.  Dr. Vic Ripper agrees.    07/22/2021 Procedure   IR INT EXT BILIARY DRAIN WITH CHOLANGIOGRAM ( IMPRESSION: 1. 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 2. Successful brush biopsy of obstructing lesion x3. 3. Successful placement of a 10 French internal/external biliary drainage catheter.   PLAN: 1. Follow bilirubin. When significant downward trend is clear, the bag should be capped. Recommend capping bag before discharge home if possible. 2. Follow-up in IR in 4-6 weeks for initial biliary tube check and exchange. If initial brush biopsies are negative, repeat biopsy could be considered at that time.   Pancreatic cancer ([place])  08/05/2021 Cancer Staging   Staging form: Exocrine Pancreas, AJCC 8th Edition - Clinical stage from 08/05/2021: Stage IB (cT2, cN0, cM0) - Signed by Truitt Merle, MD on 08/17/2021 Stage prefix: Initial diagnosis Total positive nodes: 0    08/14/2021 Initial Diagnosis   Pancreatic cancer ([place])   08/20/2021 -  Chemotherapy   Patient is on Treatment Plan : PANCREATIC Abraxane / Gemcitabine D1,8,15 q28d       CURRENT THERAPY: pending neoadjuvant chemotherapy gemcitabine and Abraxane on day 1, 8 every 21 days  INTERVAL HISTORY: Patient is scheduled for a virtual visit for follow-up and finalize his treatment plan.  I spoke with both patient and his daughter on the phone.  Angel Keller is clinically stable, still has intermittent epigastric/chest pain, especially after meals.  He is on Pepcid and pantoprazole.  He is not sure if he has lost more weight.  His daughter canceled his nutrition appointment this morning because she can not bring him in today. No other new complaints.   REVIEW OF SYSTEMS:   Constitutional: Denies fevers, chills or abnormal weight  loss Eyes: Denies blurriness of vision Ears, nose, mouth, throat, and face: Denies mucositis or sore throat Respiratory: Denies cough, dyspnea or wheezes Cardiovascular: Denies palpitation, chest discomfort or lower extremity swelling Gastrointestinal:  see HPI  Skin: Denies abnormal skin rashes Lymphatics: Denies new lymphadenopathy or easy bruising Neurological:Denies numbness, tingling or new weaknesses Behavioral/Psych: Mood is stable, no new changes  All other systems were reviewed with the patient and are negative.  MEDICAL HISTORY:  Past Medical History:  Diagnosis Date   Aneurysm ([place])    2017   Anxiety    Arthritis    Depression    Headache    HLD (hyperlipidemia)    Hx of inguinal hernia repair    Hypertension    Hypothyroidism    Overactive bladder    Pancreatic cancer ([facility])    Pneumonia    Shingles    Sleep apnea    Patient denies   Thoracic ascending aortic aneurysm    mildly dilated 4.0cm per 08/12/21 CT   Thyroid disease    Varicose veins of both lower extremities     SURGICAL HISTORY: Past Surgical History:  Procedure Laterality Date   BILATERAL CARPAL TUNNEL RELEASE     CATARACT EXTRACTION, BILATERAL Bilateral    ELBOW SURGERY Bilateral    ENDOSCOPIC RETROGRADE CHOLANGIOPANCREATOGRAPHY (ERCP) WITH PROPOFOL N/A 07/20/2021   Procedure: ENDOSCOPIC RETROGRADE CHOLANGIOPANCREATOGRAPHY (ERCP) WITH PROPOFOL;  Surgeon: Gatha Mayer, MD;  Location: [facility] ENDOSCOPY;  Service: Endoscopy;  Laterality:  N/A;   ESOPHAGOGASTRODUODENOSCOPY (EGD) WITH PROPOFOL N/A 08/05/2021   Procedure: ESOPHAGOGASTRODUODENOSCOPY (EGD) WITH PROPOFOL;  Surgeon: Rush Landmark Telford Nab., MD;  Location: [place];  Service: Gastroenterology;  Laterality: N/A;   EUS N/A 08/05/2021   Procedure: UPPER ENDOSCOPIC ULTRASOUND (EUS) RADIAL;  Surgeon: Irving Copas., MD;  Location: [place];  Service: Gastroenterology;  Laterality: N/A;   FINE NEEDLE ASPIRATION  08/05/2021    Procedure: FINE NEEDLE ASPIRATION (FNA) LINEAR;  Surgeon: Irving Copas., MD;  Location: [place];  Service: Gastroenterology;;   INGUINAL HERNIA REPAIR Right 1951   IR ANGIO INTRA EXTRACRAN SEL COM CAROTID INNOMINATE BILAT MOD SED  02/20/2019   IR ENDOLUMINAL BX OF BILIARY TREE  07/22/2021   IR GENERIC HISTORICAL  10/28/2016   IR RADIOLOGIST EVAL & MGMT 10/28/2016 [facility]   IR INT EXT BILIARY DRAIN WITH CHOLANGIOGRAM  07/22/2021   RADIOLOGY WITH ANESTHESIA N/A 10/21/2015   Procedure: RADIOLOGY WITH ANESTHESIA;  Surgeon: Luanne Bras, MD;  Location: [place];  Service: Radiology;  Laterality: N/A;   RADIOLOGY WITH ANESTHESIA N/A 02/20/2019   Procedure: [employer];  Surgeon: Luanne Bras, MD;  Location: [place];  Service: Radiology;  Laterality: N/A;   ROTATOR CUFF REPAIR Bilateral    VARICOSE VEIN SURGERY      I have reviewed the social history and family history with the patient and they are unchanged from previous note.  ALLERGIES:  has No Known Allergies.  MEDICATIONS:  Current Outpatient Medications  Medication Sig Dispense Refill   aspirin EC 81 MG tablet Take 1 tablet (81 mg total) by mouth daily. Hold while on blood thinner/eliquis for 3 months, then resume (Patient taking differently: Take 81 mg by mouth daily.) 30 tablet 11   bisoprolol-hydrochlorothiazide (ZIAC) 2.5-6.25 MG tablet Take 1 tablet by mouth every morning.     Cholecalciferol (VITAMIN D3) 50 MCG (2000 UT) TABS Take 2,000 Units by mouth daily.     escitalopram (LEXAPRO) 20 MG tablet Take 20 mg by mouth daily.     famotidine (PEPCID) 20 MG tablet Take 1 tablet (20 mg total) by mouth 2 (two) times daily. 60 tablet 1   lidocaine-prilocaine (EMLA) cream Apply to affected area once 30 g 3   ondansetron (ZOFRAN) 8 MG tablet Take 1 tablet (8 mg total) by mouth 2 (two) times daily as needed (Nausea or vomiting). 30 tablet 1   ondansetron (ZOFRAN-ODT) 4 MG disintegrating tablet Take 1 tablet (4 mg total) by  mouth every 8 (eight) hours as needed for nausea or vomiting. 30 tablet 1   oxyCODONE (OXY IR/ROXICODONE) 5 MG immediate release tablet Take 1 tablet (5 mg total) by mouth every 6 (six) hours as needed for moderate pain. 10 tablet 0   pantoprazole (PROTONIX) 40 MG tablet Take 1 tablet (40 mg total) by mouth daily. 30 tablet 2   prochlorperazine (COMPAZINE) 10 MG tablet Take 1 tablet (10 mg total) by mouth every 6 (six) hours as needed (Nausea or vomiting). 30 tablet 1   Sodium Chloride Flush (NORMAL SALINE FLUSH) 0.9 % SOLN Flush tube once daily with 1 syringe. 300 mL 0   sucralfate (CARAFATE) 1 GM/10ML suspension Take 10 mLs (1 g total) by mouth 2 (two) times daily. (Patient taking differently: Take 1 g by mouth daily.) 420 mL 2   SYNTHROID 112 MCG tablet Take 112 mcg by mouth daily before breakfast.     vitamin B-12 (CYANOCOBALAMIN) 1000 MCG tablet Take 1,000 mcg by mouth 2 (two) times daily.  No current facility-administered medications for this visit.    PHYSICAL EXAMINATION: ECOG PERFORMANCE STATUS: 1 - Symptomatic but completely ambulatory  No exam today   LABORATORY DATA:  I have reviewed the data as listed CBC Latest Ref Rng & Units 08/12/2021 07/27/2021 07/21/2021  WBC 4.0 - 10.5 K/uL 7.2 8.5 5.6  Hemoglobin 13.0 - 17.0 g/dL 12.3(L) 13.2 12.3(L)  Hematocrit 39.0 - 52.0 % 37.2(L) 39.8 36.7(L)  Platelets 150 - 400 K/uL 217 356 235     CMP Latest Ref Rng & Units 08/12/2021 07/27/2021 07/23/2021  Glucose 70 - 99 mg/dL 125(H) 133(H) 157(H)  BUN 8 - 23 mg/dL 25(H) 21 18  Creatinine 0.61 - 1.24 mg/dL 1.39(H) 1.18 1.13  Sodium 135 - 145 mmol/L 135 135 133(L)  Potassium 3.5 - 5.1 mmol/L 4.8 4.5 3.3(L)  Chloride 98 - 111 mmol/L 99 92(L) 96(L)  CO2 22 - 32 mmol/L '28 26 26  ' Calcium 8.9 - 10.3 mg/dL 9.4 9.5 8.7(L)  Total Protein 6.5 - 8.1 g/dL 7.0 7.0 6.4(L)  Total Bilirubin 0.3 - 1.2 mg/dL 3.2(H) 7.8(H) 10.1(H)  Alkaline Phos 38 - 126 U/L 176(H) 460(H) 460(H)  AST 15 - 41 U/L 35  92(H) 82(H)  ALT 0 - 44 U/L 36 99(H) 99(H)      RADIOGRAPHIC STUDIES: I have personally reviewed the radiological images as listed and agreed with the findings in the report. No results found.   ASSESSMENT & PLAN:  No problem-specific Assessment & Plan notes found for this encounter.   No orders of the defined types were placed in this encounter.   I discussed the assessment and treatment plan with the patient. The patient was provided an opportunity to ask questions and all were answered. The patient agreed with the plan and demonstrated an understanding of the instructions.   The patient was advised to call back or seek an in-person evaluation if the symptoms worsen or if the condition fails to improve as anticipated.  I provided 30 minutes of non face-to-face telephone visit time during this encounter, and > 50% was spent counseling as documented under my assessment & plan.    Truitt Merle, MD 08/17/21

## 2021-08-17 NOTE — Anesthesia Preprocedure Evaluation (Addendum)
Anesthesia Evaluation  Patient identified by MRN, date of birth, ID band Patient awake    Reviewed: Allergy & Precautions, NPO status , Patient's Chart, lab work & pertinent test results  History of Anesthesia Complications Negative for: history of anesthetic complications  Airway Mallampati: I  TM Distance: >3 FB Neck ROM: Full    Dental  (+) Edentulous Upper, Missing, Loose, Dental Advisory Given   Pulmonary former smoker,    breath sounds clear to auscultation       Cardiovascular hypertension, Pt. on medications + Peripheral Vascular Disease (asc aorta 4.0cm)   Rhythm:Regular Rate:Normal     Neuro/Psych  Headaches, Anxiety Depression H/o cerebral aneurysm    GI/Hepatic Neg liver ROS, GERD  Controlled,Pancreatic cancer   Endo/Other  Hypothyroidism   Renal/GU negative Renal ROS     Musculoskeletal  (+) Arthritis ,   Abdominal   Peds  Hematology negative hematology ROS (+)   Anesthesia Other Findings   Reproductive/Obstetrics                           Anesthesia Physical Anesthesia Plan  ASA: 3  Anesthesia Plan: General   Post-op Pain Management: Minimal or no pain anticipated   Induction: Intravenous  PONV Risk Score and Plan: 2 and Ondansetron and Dexamethasone  Airway Management Planned: LMA  Additional Equipment: None  Intra-op Plan:   Post-operative Plan:   Informed Consent: I have reviewed the patients History and Physical, chart, labs and discussed the procedure including the risks, benefits and alternatives for the proposed anesthesia with the patient or authorized representative who has indicated his/her understanding and acceptance.     Dental advisory given  Plan Discussed with: CRNA and Surgeon  Anesthesia Plan Comments: (See APP note by Durel Salts, FNP )      Anesthesia Quick Evaluation

## 2021-08-17 NOTE — Telephone Encounter (Signed)
Cancelled 01/03 appointment, called and left a voicemail to reschedule nutrition appointment.

## 2021-08-17 NOTE — Progress Notes (Signed)
Anesthesia Chart Review:   Case: [id number] Date/Time: 08/19/21 1045   Procedure: INSERTION PORT-A-CATH - LMA   Anesthesia type: General   Pre-op diagnosis: PANCREATIC CANCER   Location: [facility] ROOM 04 / [facility]   Surgeons: Dwan Bolt, MD       DISCUSSION: Pt is 81 years old with hx HTN, cerebral aneurysms (s/p stenting 2017, embolization 2020), OSA, pancreatic cancer  VS: Ht '5\' 9"'  (1.753 m)    Wt 69.4 kg    BMI 22.59 kg/m   PROVIDERS: - PCP is Ronnell Freshwater, NP - Oncologist is Truitt Merle, MD   LABS: Labs reviewed: Acceptable for surgery. - CBC w/diff 08/12/21: hgb 12.3, hct 37.2. Platelets and WBC normal - CMP 08/12/21: glucose 125, Cr 1.39. Alk phos 176; total bilirubin 3.2   IMAGES: CT chest 08/12/21:  1. Poorly marginated heterogeneous hypoenhancing 3.6 cm pancreatic head mass, mildly increased in size since 07/18/2021 CT, compatible with known primary pancreatic adenocarcinoma. Partial pancreatic tumor involvement of the SMV, proximal portosplenic venous confluence, main portal vein and hepatic artery as detailed. 2. Percutaneous right abdominal approach internal external biliary drain terminates in the descending duodenal lumen. Mild central intrahepatic biliary ductal dilatation, improved from 07/18/2021 CT. CBD diameter 9 mm, decreased. 3. No evidence of metastatic disease in the chest, abdomen or pelvis. 4. Mildly dilated 4.0 cm ascending thoracic aorta.  Recommend annual imaging followup by CTA or MRA.  5. 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 6. Three-vessel coronary atherosclerosis. 7. Aortic Atherosclerosis (ICD10-I70.0).   CXR 07/18/21:  - Linear peribronchial opacities in bilateral lung bases, left greater than right. This may represent atelectasis, bronchitic changes or atypical pneumonia.   MR angio head 09/04/20:  1. Status post right MCA M1-M2 posterior division  stenting. Persistent and normal flow related signal distal to the stent. 2. Stable 3 mm right M1 segment aneurysm arising at the takeoff of the anterior temporal branch. 3. No new aneurysm or other vascular abnormality.  EKG 07/18/21: sinus rhythm   CV: N/A   Past Medical History:  Diagnosis Date   Aneurysm ([place])    2017   Anxiety    Arthritis    Depression    Headache    HLD (hyperlipidemia)    Hx of inguinal hernia repair    Hypertension    Hypothyroidism    Overactive bladder    Pancreatic cancer ([place])    Pneumonia    Shingles    Sleep apnea    Patient denies   Thoracic ascending aortic aneurysm    mildly dilated 4.0cm per 08/12/21 CT   Thyroid disease    Varicose veins of both lower extremities     Past Surgical History:  Procedure Laterality Date   BILATERAL CARPAL TUNNEL RELEASE     CATARACT EXTRACTION, BILATERAL Bilateral    ELBOW SURGERY Bilateral    ENDOSCOPIC RETROGRADE CHOLANGIOPANCREATOGRAPHY (ERCP) WITH PROPOFOL N/A 07/20/2021   Procedure: ENDOSCOPIC RETROGRADE CHOLANGIOPANCREATOGRAPHY (ERCP) WITH PROPOFOL;  Surgeon: Gatha Mayer, MD;  Location: [facility] ENDOSCOPY;  Service: Endoscopy;  Laterality: N/A;   ESOPHAGOGASTRODUODENOSCOPY (EGD) WITH PROPOFOL N/A 08/05/2021   Procedure: ESOPHAGOGASTRODUODENOSCOPY (EGD) WITH PROPOFOL;  Surgeon: Rush Landmark Telford Nab., MD;  Location: [place];  Service: Gastroenterology;  Laterality: N/A;   EUS N/A 08/05/2021   Procedure: UPPER ENDOSCOPIC ULTRASOUND (EUS) RADIAL;  Surgeon: Irving Copas., MD;  Location: [place];  Service: Gastroenterology;  Laterality: N/A;   FINE NEEDLE ASPIRATION  08/05/2021   Procedure: FINE NEEDLE ASPIRATION (FNA) LINEAR;  Surgeon: Irving Copas., MD;  Location: [place];  Service: Gastroenterology;;   INGUINAL HERNIA REPAIR Right 1951   IR ANGIO INTRA EXTRACRAN SEL COM CAROTID INNOMINATE BILAT MOD SED  02/20/2019   IR ENDOLUMINAL BX OF BILIARY TREE  07/22/2021   IR  GENERIC HISTORICAL  10/28/2016   IR RADIOLOGIST EVAL & MGMT 10/28/2016 [facility]   IR INT EXT BILIARY DRAIN WITH CHOLANGIOGRAM  07/22/2021   RADIOLOGY WITH ANESTHESIA N/A 10/21/2015   Procedure: RADIOLOGY WITH ANESTHESIA;  Surgeon: Luanne Bras, MD;  Location: [place];  Service: Radiology;  Laterality: N/A;   RADIOLOGY WITH ANESTHESIA N/A 02/20/2019   Procedure: [employer];  Surgeon: Luanne Bras, MD;  Location: [place];  Service: Radiology;  Laterality: N/A;   ROTATOR CUFF REPAIR Bilateral    VARICOSE VEIN SURGERY      MEDICATIONS: No current facility-administered medications for this encounter.    aspirin EC 81 MG tablet   bisoprolol-hydrochlorothiazide (ZIAC) 2.5-6.25 MG tablet   Cholecalciferol (VITAMIN D3) 50 MCG (2000 UT) TABS   escitalopram (LEXAPRO) 20 MG tablet   famotidine (PEPCID) 20 MG tablet   ondansetron (ZOFRAN-ODT) 4 MG disintegrating tablet   oxyCODONE (OXY IR/ROXICODONE) 5 MG immediate release tablet   pantoprazole (PROTONIX) 40 MG tablet   sucralfate (CARAFATE) 1 GM/10ML suspension   SYNTHROID 112 MCG tablet   vitamin B-12 (CYANOCOBALAMIN) 1000 MCG tablet   lidocaine-prilocaine (EMLA) cream   ondansetron (ZOFRAN) 8 MG tablet   prochlorperazine (COMPAZINE) 10 MG tablet   Sodium Chloride Flush (NORMAL SALINE FLUSH) 0.9 % SOLN    If no changes, I anticipate pt can proceed with surgery as scheduled.   Angel Cass, PhD, FNP-BC [facility]/Anesthesiology Phone: [phone] 08/17/2021 10:47 AM

## 2021-08-17 NOTE — Telephone Encounter (Signed)
Scheduled per 12/30 and provider orders, called patient and left a voicemail regarding all upcoming appointments. Informed patient we are still waiting on add-ons to be answered and his schedule may look incomplete.

## 2021-08-18 ENCOUNTER — Other Ambulatory Visit: Payer: Self-pay

## 2021-08-18 ENCOUNTER — Inpatient Hospital Stay: Payer: Medicare HMO

## 2021-08-18 ENCOUNTER — Ambulatory Visit: Payer: Medicare HMO | Admitting: Hematology

## 2021-08-19 ENCOUNTER — Encounter (HOSPITAL_COMMUNITY)
Admission: RE | Disposition: A | Discharge status: Home / Self Care | Payer: Self-pay | Source: Ambulatory Visit | Attending: Surgery

## 2021-08-19 ENCOUNTER — Telehealth: Payer: Self-pay | Admitting: Emergency Medicine

## 2021-08-19 ENCOUNTER — Ambulatory Visit (HOSPITAL_COMMUNITY): Payer: Medicare HMO

## 2021-08-19 ENCOUNTER — Encounter (HOSPITAL_COMMUNITY): Payer: Self-pay | Admitting: Surgery

## 2021-08-19 ENCOUNTER — Ambulatory Visit (HOSPITAL_COMMUNITY): Payer: Medicare HMO | Admitting: Emergency Medicine

## 2021-08-19 ENCOUNTER — Ambulatory Visit (HOSPITAL_COMMUNITY)
Admission: RE | Admit: 2021-08-19 | Discharge: 2021-08-19 | Disposition: A | Discharge status: Home / Self Care | Payer: Medicare HMO | Source: Ambulatory Visit | Attending: Surgery | Admitting: Surgery

## 2021-08-19 ENCOUNTER — Other Ambulatory Visit: Payer: Self-pay | Admitting: Surgery

## 2021-08-19 DIAGNOSIS — I10 Essential (primary) hypertension: Secondary | ICD-10-CM | POA: Diagnosis not present

## 2021-08-19 DIAGNOSIS — I671 Cerebral aneurysm, nonruptured: Secondary | ICD-10-CM | POA: Diagnosis not present

## 2021-08-19 DIAGNOSIS — K219 Gastro-esophageal reflux disease without esophagitis: Secondary | ICD-10-CM | POA: Insufficient documentation

## 2021-08-19 DIAGNOSIS — G4733 Obstructive sleep apnea (adult) (pediatric): Secondary | ICD-10-CM | POA: Insufficient documentation

## 2021-08-19 DIAGNOSIS — Z95828 Presence of other vascular implants and grafts: Secondary | ICD-10-CM

## 2021-08-19 DIAGNOSIS — E039 Hypothyroidism, unspecified: Secondary | ICD-10-CM | POA: Diagnosis not present

## 2021-08-19 DIAGNOSIS — Z87891 Personal history of nicotine dependence: Secondary | ICD-10-CM | POA: Diagnosis not present

## 2021-08-19 DIAGNOSIS — Z79899 Other long term (current) drug therapy: Secondary | ICD-10-CM | POA: Diagnosis not present

## 2021-08-19 DIAGNOSIS — C259 Malignant neoplasm of pancreas, unspecified: Secondary | ICD-10-CM | POA: Diagnosis present

## 2021-08-19 DIAGNOSIS — C250 Malignant neoplasm of head of pancreas: Secondary | ICD-10-CM

## 2021-08-19 HISTORY — PX: PORTACATH PLACEMENT: SHX2246

## 2021-08-19 HISTORY — DX: Aneurysm of the ascending aorta, without rupture: I71.21

## 2021-08-19 HISTORY — DX: Malignant neoplasm of pancreas, unspecified: C25.9

## 2021-08-19 SURGERY — INSERTION, TUNNELED CENTRAL VENOUS DEVICE, WITH PORT
Anesthesia: General | Site: Chest

## 2021-08-19 MED ORDER — LIDOCAINE HCL (PF) 2 % IJ SOLN
INTRAMUSCULAR | Status: AC
  Filled 2021-08-19: qty 5

## 2021-08-19 MED ORDER — 0.9 % SODIUM CHLORIDE (POUR BTL) OPTIME
TOPICAL | Status: DC | PRN
Start: 2021-08-19 — End: 2021-08-19
  Administered 2021-08-19: 1000 mL

## 2021-08-19 MED ORDER — PROPOFOL 10 MG/ML IV BOLUS
INTRAVENOUS | Status: DC | PRN
  Administered 2021-08-19: 150 mg via INTRAVENOUS

## 2021-08-19 MED ORDER — ORAL CARE MOUTH RINSE: 15.0000 mL | Freq: Once | OROMUCOSAL | Status: AC

## 2021-08-19 MED ORDER — ONDANSETRON HCL 4 MG/2ML IJ SOLN
INTRAMUSCULAR | Status: AC
  Filled 2021-08-19: qty 2

## 2021-08-19 MED ORDER — EPHEDRINE 5 MG/ML INJ
INTRAVENOUS | Status: AC
  Filled 2021-08-19: qty 5

## 2021-08-19 MED ORDER — LACTATED RINGERS IV SOLN: INTRAVENOUS | Status: DC

## 2021-08-19 MED ORDER — CEFAZOLIN SODIUM-DEXTROSE 2-4 GM/100ML-% IV SOLN
2.0000 g | INTRAVENOUS | Status: AC
  Administered 2021-08-19: 2 g via INTRAVENOUS
  Filled 2021-08-19: qty 100

## 2021-08-19 MED ORDER — FENTANYL CITRATE (PF) 100 MCG/2ML IJ SOLN
INTRAMUSCULAR | Status: AC
  Filled 2021-08-19: qty 2

## 2021-08-19 MED ORDER — EPHEDRINE SULFATE-NACL 50-0.9 MG/10ML-% IV SOSY
PREFILLED_SYRINGE | INTRAVENOUS | Status: DC | PRN
  Administered 2021-08-19 (×2): 10 mg via INTRAVENOUS
  Administered 2021-08-19: 5 mg via INTRAVENOUS

## 2021-08-19 MED ORDER — HEPARIN 6000 UNIT IRRIGATION SOLUTION
Freq: Once | Status: AC
  Administered 2021-08-19: 1
  Filled 2021-08-19: qty 6000

## 2021-08-19 MED ORDER — LIDOCAINE 2% (20 MG/ML) 5 ML SYRINGE
INTRAMUSCULAR | Status: DC | PRN
  Administered 2021-08-19: 20 mg via INTRAVENOUS

## 2021-08-19 MED ORDER — PROPOFOL 10 MG/ML IV BOLUS
INTRAVENOUS | Status: AC
  Filled 2021-08-19: qty 20

## 2021-08-19 MED ORDER — BUPIVACAINE-EPINEPHRINE 0.25% -1:200000 IJ SOLN
INTRAMUSCULAR | Status: DC | PRN
  Administered 2021-08-19: 7 mL

## 2021-08-19 MED ORDER — HEPARIN SOD (PORK) LOCK FLUSH 100 UNIT/ML IV SOLN
INTRAVENOUS | Status: DC | PRN
  Administered 2021-08-19: 500 [IU] via INTRAVENOUS

## 2021-08-19 MED ORDER — FENTANYL CITRATE (PF) 100 MCG/2ML IJ SOLN
INTRAMUSCULAR | Status: DC | PRN
  Administered 2021-08-19: 25 ug via INTRAVENOUS

## 2021-08-19 MED ORDER — DEXAMETHASONE SODIUM PHOSPHATE 10 MG/ML IJ SOLN
INTRAMUSCULAR | Status: DC | PRN
Start: 2021-08-19 — End: 2021-08-19
  Administered 2021-08-19: 10 mg via INTRAVENOUS

## 2021-08-19 MED ORDER — FENTANYL CITRATE PF 50 MCG/ML IJ SOSY: 25.0000 ug | PREFILLED_SYRINGE | INTRAMUSCULAR | Status: DC | PRN

## 2021-08-19 MED ORDER — ONDANSETRON HCL 4 MG/2ML IJ SOLN
INTRAMUSCULAR | Status: DC | PRN
  Administered 2021-08-19: 4 mg via INTRAVENOUS

## 2021-08-19 MED ORDER — BUPIVACAINE-EPINEPHRINE (PF) 0.25% -1:200000 IJ SOLN
INTRAMUSCULAR | Status: AC
  Filled 2021-08-19: qty 30

## 2021-08-19 MED ORDER — HEPARIN SOD (PORK) LOCK FLUSH 100 UNIT/ML IV SOLN
INTRAVENOUS | Status: AC
  Filled 2021-08-19: qty 5

## 2021-08-19 MED ORDER — CHLORHEXIDINE GLUCONATE 0.12 % MT SOLN
15.0000 mL | Freq: Once | OROMUCOSAL | Status: AC
  Administered 2021-08-19: 15 mL via OROMUCOSAL

## 2021-08-19 SURGICAL SUPPLY — 44 items
ADH SKN CLS APL DERMABOND .7 (GAUZE/BANDAGES/DRESSINGS)
APL PRP STRL LF DISP 70% ISPRP (MISCELLANEOUS) ×1
APL SKNCLS STERI-STRIP NONHPOA (GAUZE/BANDAGES/DRESSINGS) ×1
BAG COUNTER SPONGE SURGICOUNT (BAG) IMPLANT
BAG DECANTER FOR FLEXI CONT (MISCELLANEOUS) ×3 IMPLANT
BAG SPNG CNTER NS LX DISP (BAG)
BENZOIN TINCTURE PRP APPL 2/3 (GAUZE/BANDAGES/DRESSINGS) ×2 IMPLANT
BLADE SURG 15 STRL LF DISP TIS (BLADE) ×1 IMPLANT
BLADE SURG 15 STRL SS (BLADE) ×2
BLADE SURG SZ11 CARB STEEL (BLADE) ×3 IMPLANT
CHLORAPREP W/TINT 26 (MISCELLANEOUS) ×2 IMPLANT
CLSR STERI-STRIP ANTIMIC 1/2X4 (GAUZE/BANDAGES/DRESSINGS) ×1 IMPLANT
COVER SURGICAL LIGHT HANDLE (MISCELLANEOUS) ×3 IMPLANT
DECANTER SPIKE VIAL GLASS SM (MISCELLANEOUS) ×2 IMPLANT
DERMABOND ADVANCED (GAUZE/BANDAGES/DRESSINGS)
DERMABOND ADVANCED .7 DNX12 (GAUZE/BANDAGES/DRESSINGS) ×1 IMPLANT
DRAPE C-ARM 42X120 X-RAY (DRAPES) ×3 IMPLANT
DRAPE LAPAROSCOPIC ABDOMINAL (DRAPES) ×2 IMPLANT
DRSG TEGADERM 4X4.75 (GAUZE/BANDAGES/DRESSINGS) ×1 IMPLANT
ELECT REM PT RETURN 15FT ADLT (MISCELLANEOUS) ×3 IMPLANT
GAUZE 4X4 16PLY ~~LOC~~+RFID DBL (SPONGE) ×2 IMPLANT
GAUZE SPONGE 4X4 12PLY STRL (GAUZE/BANDAGES/DRESSINGS) ×2 IMPLANT
GLOVE SURG POLYISO LF SZ5.5 (GLOVE) ×2 IMPLANT
GLOVE SURG POLYISO LF SZ6 (GLOVE) ×3 IMPLANT
GOWN STRL REUS W/TWL LRG LVL3 (GOWN DISPOSABLE) ×2 IMPLANT
GOWN STRL REUS W/TWL XL LVL3 (GOWN DISPOSABLE) ×2 IMPLANT
KIT BASIN OR (CUSTOM PROCEDURE TRAY) ×2 IMPLANT
KIT PORT POWER 8FR ISP CVUE (Port) ×2 IMPLANT
KIT TURNOVER KIT A (KITS) ×1 IMPLANT
NEEDLE HYPO 22GX1.5 SAFETY (NEEDLE) ×2 IMPLANT
PACK BASIC VI WITH GOWN DISP (CUSTOM PROCEDURE TRAY) ×2 IMPLANT
PENCIL SMOKE EVACUATOR (MISCELLANEOUS) IMPLANT
SUT MNCRL AB 4-0 PS2 18 (SUTURE) ×3 IMPLANT
SUT PROLENE 2 0 SH DA (SUTURE) ×4 IMPLANT
SUT VIC AB 2-0 SH 18 (SUTURE) IMPLANT
SUT VIC AB 2-0 SH 27 (SUTURE)
SUT VIC AB 2-0 SH 27X BRD (SUTURE) IMPLANT
SUT VIC AB 3-0 SH 27 (SUTURE) ×2
SUT VIC AB 3-0 SH 27X BRD (SUTURE) ×1 IMPLANT
SYR 10ML LL (SYRINGE) ×2 IMPLANT
SYR 20ML LL LF (SYRINGE) ×3 IMPLANT
TAPE PAPER 3X10 WHT MICROPORE (GAUZE/BANDAGES/DRESSINGS) ×1 IMPLANT
TOWEL OR 17X26 10 PK STRL BLUE (TOWEL DISPOSABLE) ×3 IMPLANT
TOWEL OR NON WOVEN STRL DISP B (DISPOSABLE) ×2 IMPLANT

## 2021-08-19 NOTE — Telephone Encounter (Signed)
MTG-015 - Tissue and Bodily Fluids: Translational Medicine: Discovery and Evaluation of Biomarkers/Pharmacogenomics for the Diagnosis and Personalized Management of Patients   08/19/21  Patient's daughter, Lattie Haw, called back regarding this study.  She states that the patient was interested in participating in this research study.  Scheduled appointment to consent to the study for tomorrow at 12:00pm prior to his existing lab appointment at 12:45pm.  She denied questions at this time.  Clabe Seal Clinical Research Coordinator I  08/19/21  2:32 PM

## 2021-08-19 NOTE — Telephone Encounter (Signed)
MTG-015 - Tissue and Bodily Fluids: Translational Medicine: Discovery and Evaluation of Biomarkers/Pharmacogenomics for the Diagnosis and Personalized Management of Patients   08/19/21  Dr. Burr Medico referred this patient for the MTG 3505-A study.  The patient is planning to start treatment tomorrow.  Called patient to introduce study.  No answer, left voicemail requesting return call.  Clabe Seal Clinical Research Coordinator I  08/19/21  2:20 PM

## 2021-08-19 NOTE — Discharge Instructions (Signed)
SURGERY DISCHARGE INSTRUCTIONS: PORT-A-CATH PLACEMENT  Activity You may resume your usual activities as tolerated Ok to shower in 48 hours, but do not bathe or submerge incisions underwater. Do not drive while taking narcotic pain medication.  Wound Care Your port has been left accessed so that it can be used for your first chemotherapy infusion tomorrow - please leave this dressing in place and do not remove it or shower over it. The needle and dressing will be removed after you finish your first infusion. Your incision is covered with small white strips (steristrips) - these will fall off after about 1 week. If they have not fallen off after 7 days, you may peel them off. You may shower in 48 hours and allow warm soapy water to run over your incisions. Gently pat dry. Do not submerge your incision underwater for 2 weeks Monitor your incision for any new redness, tenderness, or drainage.  When to Call us: Fever greater than 100.5 New redness, drainage, or swelling at incision site Severe pain, nausea, or vomiting Shortness of breath, difficulty breathing  For questions or concerns, please call the [facility] Surgery office at [phone].

## 2021-08-19 NOTE — Transfer of Care (Signed)
Immediate Anesthesia Transfer of Care Note  Patient: Angel STOGSDILL Sr.  Procedure(s) Performed: INSERTION PORT-A-CATH (Chest)  Patient Location: PACU  Anesthesia Type:General  Level of Consciousness: awake, alert , oriented and patient cooperative  Airway & Oxygen Therapy: Patient Spontanous Breathing and Patient connected to face mask oxygen  Post-op Assessment: Report given to RN, Post -op Vital signs reviewed and stable and Patient moving all extremities  Post vital signs: Reviewed and stable  Last Vitals:  Vitals Value Taken Time  BP 162/75 08/19/21 1122  Temp    Pulse 64 08/19/21 1126  Resp 20 08/19/21 1126  SpO2 100 % 08/19/21 1126  Vitals shown include unvalidated device data.  Last Pain:  Vitals:   08/19/21 0906  TempSrc:   PainSc: 0-No pain         Complications: No notable events documented.

## 2021-08-19 NOTE — H&P (Signed)
Angel Celeste Sr. is an 81 y.o. male.   Chief Complaint: pancreatic cancer HPI: Angel Keller is an 81 yo male who was recently diagnosed with pancreatic cancer after presenting with obstructive jaundice and weight loss. He underwent ERCP, which was not successful, and had a PTC with placement of an internal/external biliary drain on 12/8. He will be undergoing neoadjuvant chemotherapy and presents today for port placement. He had staging scans on 12/29, which showed no metastatic disease. CT pancreas protocol showed some distortion of the SMV/PV and abutment of the hepatic artery.  Past Medical History:  Diagnosis Date   Aneurysm ([place])    2017   Anxiety    Arthritis    Depression    Headache    HLD (hyperlipidemia)    Hx of inguinal hernia repair    Hypertension    Hypothyroidism    Overactive bladder    Pancreatic cancer (HCC)    Pneumonia    Shingles    Sleep apnea    Patient denies   Thoracic ascending aortic aneurysm    mildly dilated 4.0cm per 08/12/21 CT   Thyroid disease    Varicose veins of both lower extremities     Past Surgical History:  Procedure Laterality Date   BILATERAL CARPAL TUNNEL RELEASE     CATARACT EXTRACTION, BILATERAL Bilateral    ELBOW SURGERY Bilateral    ENDOSCOPIC RETROGRADE CHOLANGIOPANCREATOGRAPHY (ERCP) WITH PROPOFOL N/A 07/20/2021   Procedure: ENDOSCOPIC RETROGRADE CHOLANGIOPANCREATOGRAPHY (ERCP) WITH PROPOFOL;  Surgeon: Gatha Mayer, MD;  Location[facility] ENDOSCOPY;  Service: Endoscopy;  Laterality: N/A;   ESOPHAGOGASTRODUODENOSCOPY (EGD) WITH PROPOFOL N/A 08/05/2021   Procedure: ESOPHAGOGASTRODUODENOSCOPY (EGD) WITH PROPOFOL;  Surgeon: Rush Landmark Telford Nab., MD;  Location: [place];  Service: Gastroenterology;  Laterality: N/A;   EUS N/A 08/05/2021   Procedure: UPPER ENDOSCOPIC ULTRASOUND (EUS) RADIAL;  Surgeon: Irving Copas., MD;  Location: [place];  Service: Gastroenterology;  Laterality: N/A;   FINE NEEDLE ASPIRATION   08/05/2021   Procedure: FINE NEEDLE ASPIRATION (FNA) LINEAR;  Surgeon: Irving Copas., MD;  Location: [place];  Service: Gastroenterology;;   INGUINAL HERNIA REPAIR Right 1951   IR ANGIO INTRA EXTRACRAN SEL COM CAROTID INNOMINATE BILAT MOD SED  02/20/2019   IR ENDOLUMINAL BX OF BILIARY TREE  07/22/2021   IR GENERIC HISTORICAL  10/28/2016   IR RADIOLOGIST EVAL & MGMT 10/28/2016 [facility]INTER[facility] RAD   IR INT EXT BILIARY DRAIN WITH CHOLANGIOGRAM  07/22/2021   RADIOLOGY WITH ANESTHESIA N/A 10/21/2015   Procedure: RADIOLOGY WITH ANESTHESIA;  Surgeon: Luanne Bras, MD;  Location: [place];  Service: Radiology;  Laterality: N/A;   RADIOLOGY WITH ANESTHESIA N/A 02/20/2019   Procedure: [employer];  Surgeon: Luanne Bras, MD;  Location: [place];  Service: Radiology;  Laterality: N/A;   ROTATOR CUFF REPAIR Bilateral    VARICOSE VEIN SURGERY      Family History  Problem Relation Age of Onset   Breast cancer Mother    Lung cancer Father    Alcoholism Sister    Cirrhosis Sister    Colon cancer Neg Hx    Esophageal cancer Neg Hx    Liver cancer Neg Hx    Pancreatic cancer Neg Hx    Prostate cancer Neg Hx    Social History:  reports that he quit smoking about 60 years ago. His smoking use included cigarettes. He has a 0.25 pack-year smoking history. He has never used smokeless tobacco. He reports that he does not drink alcohol and does not use  drugs.  Allergies: No Known Allergies  Medications Prior to Admission  Medication Sig Dispense Refill   aspirin EC 81 MG tablet Take 1 tablet (81 mg total) by mouth daily. Hold while on blood thinner/eliquis for 3 months, then resume (Patient taking differently: Take 81 mg by mouth daily.) 30 tablet 11   bisoprolol-hydrochlorothiazide (ZIAC) 2.5-6.25 MG tablet Take 1 tablet by mouth every morning.     Cholecalciferol (VITAMIN D3) 50 MCG (2000 UT) TABS Take 2,000 Units by mouth daily.     escitalopram (LEXAPRO) 20 MG tablet Take 20 mg by mouth daily.      famotidine (PEPCID) 20 MG tablet Take 1 tablet (20 mg total) by mouth 2 (two) times daily. 60 tablet 1   ondansetron (ZOFRAN-ODT) 4 MG disintegrating tablet Take 1 tablet (4 mg total) by mouth every 8 (eight) hours as needed for nausea or vomiting. 30 tablet 1   pantoprazole (PROTONIX) 40 MG tablet Take 1 tablet (40 mg total) by mouth daily. 30 tablet 2   sucralfate (CARAFATE) 1 GM/10ML suspension Take 10 mLs (1 g total) by mouth 2 (two) times daily. (Patient taking differently: Take 1 g by mouth daily.) 420 mL 2   SYNTHROID 112 MCG tablet Take 112 mcg by mouth daily before breakfast.     vitamin B-12 (CYANOCOBALAMIN) 1000 MCG tablet Take 1,000 mcg by mouth 2 (two) times daily.     lidocaine-prilocaine (EMLA) cream Apply to affected area once 30 g 3   ondansetron (ZOFRAN) 8 MG tablet Take 1 tablet (8 mg total) by mouth 2 (two) times daily as needed (Nausea or vomiting). 30 tablet 1   oxyCODONE (OXY IR/ROXICODONE) 5 MG immediate release tablet Take 1 tablet (5 mg total) by mouth every 6 (six) hours as needed for moderate pain. 10 tablet 0   prochlorperazine (COMPAZINE) 10 MG tablet Take 1 tablet (10 mg total) by mouth every 6 (six) hours as needed (Nausea or vomiting). 30 tablet 1   Sodium Chloride Flush (NORMAL SALINE FLUSH) 0.9 % SOLN Flush tube once daily with 1 syringe. 300 mL 0    No results found for this or any previous visit (from the past 48 hour(s)). No results found.  Review of Systems  Blood pressure (!) 172/97, pulse (!) 55, temperature 97.8 F (36.6 C), temperature source Oral, resp. rate 15, height '5\' 9"'  (1.753 m), weight 69.4 kg, SpO2 97 %. Physical Exam Vitals reviewed.  Constitutional:      General: He is not in acute distress. HENT:     Head: Normocephalic and atraumatic.  Eyes:     General: No scleral icterus.    Conjunctiva/sclera: Conjunctivae normal.  Pulmonary:     Effort: Pulmonary effort is normal. No respiratory distress.  Musculoskeletal:     Cervical  back: Normal range of motion.  Skin:    General: Skin is warm and dry.     Coloration: Skin is not jaundiced.  Neurological:     General: No focal deficit present.     Mental Status: He is alert and oriented to person, place, and time.  Psychiatric:        Mood and Affect: Mood normal.        Behavior: Behavior normal.        Thought Content: Thought content normal.     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 Proceed with portacath placement. Patient is to begin chemotherapy  tomorrow, so I will leave his port accessed. I discussed the details of the procedure with the patient including the risk of pneumothorax and he agrees to proceed. Plan for CXR in PACU and discharge home.  Dwan Bolt, MD 08/19/2021, 9:50 AM

## 2021-08-19 NOTE — Op Note (Signed)
Date: 08/19/21  Patient: Angel RENDLEMAN Sr. MRN: [id number]  Preoperative Diagnosis: Pancreatic cancer Postoperative Diagnosis: Same  Procedure: Port-a-cath insertion  Surgeon: Michaelle Birks, MD  EBL: Minimal  Anesthesia: General LMA  Specimens: None  Indications: Angel Keller is an 81 yo male with a recently-diagnosed borderline-resectable pancreatic cancer. He will begin neoadjuvant chemotherapy and presents for port placement.  Findings: 8-Fr single-lumen port port placed via the right subclavian vein under fluoroscopic guidance, with a total catheter length of 16cm.  Procedure details: Informed consent was obtained in the preoperative area prior to the procedure. The patient was brought to the operating room and placed on the table in the supine position. General anesthesia was induced and appropriate lines and drains were placed for intraoperative monitoring. Perioperative antibiotics were administered per SCIP guidelines. The chest and neck were prepped and draped in the usual sterile fashion. A pre-procedure timeout was taken verifying patient identity, surgical site and procedure to be performed.  The patient was placed in Trendelenberg and the right subclavian vein was accessed with a large-bore needle. A guidewire was inserted and advanced, and position in the SVC was confirmed fluoroscopically. The needle was removed and the wire was clipped to the drapes to secure its position. Next a place for the port was chosen on the upper lateral right chest, a small skin incision was made, and a subcutaneous pocket was created with cautery. The port and catheter were then flushed and brought onto the field. Three 2-0 prolene sutures were used to secure the port in the subcutaneous pocket to the pectoralis fascia, but the sutures were not tied down. The port was placed in the pocket and the attached cathether was tunneled beneath the skin to the wire exit site. The catheter was then measured using  fluoro - it was placed over the skin adjacent to the guidewire, and marked externally at the cavoatrial junction. The catheter was then cut at this location, which was at [place]. The dilator and sheath were then advanced over the guidewire under fluoroscopic guidance, and the wire and dilator were removed. The end of the catheter was inserted through the sheath and advanced, and the sheath was peeled away. The port was then accessed with a Huber needle, and blood was aspirated and the port was flushed with heparinized saline. A final fluoroscopic image confirmed appropriate position of the catheter tip within the SVC, without kinking of the catheter. The prolene sutures were tied down. The skin was closed with a deep dermal layer of interrupted 3-0 Vicryl suture, followed by a running subcuticular 4-0 monocryl suture. Steristrips were applied. The port was then accessed and a final flush of 500 units heparin (100 units/mL) was given via the port. The port was left accessed and covered with a sterile dressing, to be used tomorrow for chemotherapy.  The patient tolerated the procedure well with no apparent complications. All counts were correct x2 at the end of the procedure. The patient was extubated and taken to PACU in stable condition.  Michaelle Birks, MD 08/19/21 11:23 AM

## 2021-08-19 NOTE — Anesthesia Procedure Notes (Signed)
Procedure Name: LMA Insertion Date/Time: 08/19/2021 10:22 AM Performed by: Sharlette Dense, CRNA Patient Re-evaluated:Patient Re-evaluated prior to induction Oxygen Delivery Method: Circle system utilized Preoxygenation: Pre-oxygenation with 100% oxygen Induction Type: IV induction LMA: LMA inserted LMA Size: 4.0 Number of attempts: 1 Placement Confirmation: positive ETCO2 and breath sounds checked- equal and bilateral Tube secured with: Tape Dental Injury: Teeth and Oropharynx as per pre-operative assessment

## 2021-08-19 NOTE — Anesthesia Postprocedure Evaluation (Addendum)
Anesthesia Post Note  Patient: Angel AUTHEMENT Sr.  Procedure(s) Performed: INSERTION PORT-A-CATH (Chest)     Patient location during evaluation: PACU Anesthesia Type: General Level of consciousness: awake and alert and patient cooperative Pain management: pain level controlled Vital Signs Assessment: post-procedure vital signs reviewed and stable Respiratory status: spontaneous breathing, nonlabored ventilation and respiratory function stable Cardiovascular status: blood pressure returned to baseline and stable Postop Assessment: no apparent nausea or vomiting Anesthetic complications: no   No notable events documented.  Last Vitals:  Vitals:   08/19/21 1145 08/19/21 1208  BP: (!) 154/68 (!) 179/91  Pulse: 66 66  Resp: 18 17  Temp: (!) 36.4 C   SpO2: 96% 97%    Last Pain:  Vitals:   08/19/21 1208  TempSrc:   PainSc: 0-No pain                 Kamdin Follett,E. Eligh Rybacki

## 2021-08-20 ENCOUNTER — Other Ambulatory Visit: Payer: Medicare HMO

## 2021-08-20 ENCOUNTER — Inpatient Hospital Stay: Payer: Medicare HMO

## 2021-08-20 ENCOUNTER — Encounter (HOSPITAL_COMMUNITY): Payer: Self-pay | Admitting: Surgery

## 2021-08-20 ENCOUNTER — Other Ambulatory Visit: Payer: Self-pay | Admitting: Hematology

## 2021-08-20 ENCOUNTER — Inpatient Hospital Stay: Payer: Medicare HMO | Admitting: Emergency Medicine

## 2021-08-20 ENCOUNTER — Other Ambulatory Visit: Payer: Self-pay

## 2021-08-20 VITALS — BP 126/72 | HR 51 | Temp 98.0°F | Resp 18

## 2021-08-20 DIAGNOSIS — C251 Malignant neoplasm of body of pancreas: Secondary | ICD-10-CM

## 2021-08-20 DIAGNOSIS — C250 Malignant neoplasm of head of pancreas: Secondary | ICD-10-CM

## 2021-08-20 DIAGNOSIS — Z95828 Presence of other vascular implants and grafts: Secondary | ICD-10-CM

## 2021-08-20 DIAGNOSIS — Z79899 Other long term (current) drug therapy: Secondary | ICD-10-CM | POA: Diagnosis not present

## 2021-08-20 DIAGNOSIS — Z5111 Encounter for antineoplastic chemotherapy: Secondary | ICD-10-CM | POA: Diagnosis present

## 2021-08-20 LAB — CMP (CANCER CENTER ONLY)
ALT: 35 U/L (ref 0–44)
AST: 48 U/L — ABNORMAL HIGH (ref 15–41)
Albumin: 3.8 g/dL (ref 3.5–5.0)
Alkaline Phosphatase: 126 U/L (ref 38–126)
Anion gap: 9 (ref 5–15)
BUN: 33 mg/dL — ABNORMAL HIGH (ref 8–23)
CO2: 30 mmol/L (ref 22–32)
Calcium: 9.4 mg/dL (ref 8.9–10.3)
Chloride: 96 mmol/L — ABNORMAL LOW (ref 98–111)
Creatinine: 1.28 mg/dL — ABNORMAL HIGH (ref 0.61–1.24)
GFR, Estimated: 56 mL/min — ABNORMAL LOW (ref >60)
Glucose, Bld: 103 mg/dL — ABNORMAL HIGH (ref 70–99)
Potassium: 4.1 mmol/L (ref 3.5–5.1)
Sodium: 135 mmol/L (ref 135–145)
Total Bilirubin: 2.2 mg/dL — ABNORMAL HIGH (ref 0.3–1.2)
Total Protein: 7.1 g/dL (ref 6.5–8.1)

## 2021-08-20 LAB — CBC WITH DIFFERENTIAL (CANCER CENTER ONLY)
Abs Immature Granulocytes: 0.03 10*3/uL (ref 0.00–0.07)
Basophils Absolute: 0 10*3/uL (ref 0.0–0.1)
Basophils Relative: 0 %
Eosinophils Absolute: 0.1 10*3/uL (ref 0.0–0.5)
Eosinophils Relative: 1 %
HCT: 34.6 % — ABNORMAL LOW (ref 39.0–52.0)
Hemoglobin: 11.6 g/dL — ABNORMAL LOW (ref 13.0–17.0)
Immature Granulocytes: 0 %
Lymphocytes Relative: 35 %
Lymphs Abs: 2.6 10*3/uL (ref 0.7–4.0)
MCH: 26.7 pg (ref 26.0–34.0)
MCHC: 33.5 g/dL (ref 30.0–36.0)
MCV: 79.5 fL — ABNORMAL LOW (ref 80.0–100.0)
Monocytes Absolute: 0.8 10*3/uL (ref 0.1–1.0)
Monocytes Relative: 11 %
Neutro Abs: 3.9 10*3/uL (ref 1.7–7.7)
Neutrophils Relative %: 53 %
Platelet Count: 232 10*3/uL (ref 150–400)
RBC: 4.35 MIL/uL (ref 4.22–5.81)
RDW: 14.8 % (ref 11.5–15.5)
WBC Count: 7.5 10*3/uL (ref 4.0–10.5)
nRBC: 0 % (ref 0.0–0.2)

## 2021-08-20 LAB — RESEARCH LABS

## 2021-08-20 MED ORDER — PROCHLORPERAZINE MALEATE 10 MG PO TABS
10.0000 mg | ORAL_TABLET | Freq: Once | ORAL | Status: AC
  Administered 2021-08-20: 10 mg via ORAL
  Filled 2021-08-20: qty 1

## 2021-08-20 MED ORDER — SODIUM CHLORIDE 0.9 % IV SOLN: Freq: Once | INTRAVENOUS | Status: AC

## 2021-08-20 MED ORDER — SODIUM CHLORIDE 0.9% FLUSH
10.0000 mL | INTRAVENOUS | Status: DC | PRN
  Administered 2021-08-20: 10 mL

## 2021-08-20 MED ORDER — HEPARIN SOD (PORK) LOCK FLUSH 100 UNIT/ML IV SOLN
500.0000 [IU] | Freq: Once | INTRAVENOUS | Status: AC | PRN
  Administered 2021-08-20: 500 [IU]

## 2021-08-20 MED ORDER — SODIUM CHLORIDE 0.9 % IV SOLN
800.0000 mg/m2 | Freq: Once | INTRAVENOUS | Status: AC
  Administered 2021-08-20: 1482 mg via INTRAVENOUS
  Filled 2021-08-20: qty 38.98

## 2021-08-20 MED ORDER — SODIUM CHLORIDE 0.9% FLUSH
10.0000 mL | Freq: Once | INTRAVENOUS | Status: AC
  Administered 2021-08-20: 10 mL

## 2021-08-20 NOTE — Progress Notes (Signed)
Pharmacist Chemotherapy Monitoring - Initial Assessment    Anticipated start date: 08/27/21   The following has been reviewed per standard work regarding the patient's treatment regimen: The patient's diagnosis, treatment plan and drug doses, and organ/hematologic function Lab orders and baseline tests specific to treatment regimen  The treatment plan start date, drug sequencing, and pre-medications Prior authorization status  Patient's documented medication list, including drug-drug interaction screen and prescriptions for anti-emetics and supportive care specific to the treatment regimen The drug concentrations, fluid compatibility, administration routes, and timing of the medications to be used The patient's access for treatment and lifetime cumulative dose history, if applicable  The patient's medication allergies and previous infusion related reactions, if applicable   Changes made to treatment plan:  N/A  Follow up needed:  N/A   Philomena Course, RPH, 08/20/2021  12:39 PM

## 2021-08-20 NOTE — Patient Instructions (Signed)
[facility]  Discharge Instructions: Thank you for choosing [facility] to provide your oncology and hematology care.   If you have a lab appointment with the [facility], please go directly to the [facility] and check in at the registration area.   Wear comfortable clothing and clothing appropriate for easy access to any Portacath or PICC line.   We strive to give you quality time with your provider. You may need to reschedule your appointment if you arrive late (15 or more minutes).  Arriving late affects you and other patients whose appointments are after yours.  Also, if you miss three or more appointments without notifying the office, you may be dismissed from the clinic at the providers discretion.      For prescription refill requests, have your pharmacy contact our office and allow 72 hours for refills to be completed.    Today you received the following chemotherapy and/or immunotherapy agents: Gemcitabine.       To help prevent nausea and vomiting after your treatment, we encourage you to take your nausea medication as directed.  BELOW ARE SYMPTOMS THAT SHOULD BE REPORTED IMMEDIATELY: *FEVER GREATER THAN 100.4 F (38 C) OR HIGHER *CHILLS OR SWEATING *NAUSEA AND VOMITING THAT IS NOT CONTROLLED WITH YOUR NAUSEA MEDICATION *UNUSUAL SHORTNESS OF BREATH *UNUSUAL BRUISING OR BLEEDING *URINARY PROBLEMS (pain or burning when urinating, or frequent urination) *BOWEL PROBLEMS (unusual diarrhea, constipation, pain near the anus) TENDERNESS IN MOUTH AND THROAT WITH OR WITHOUT PRESENCE OF ULCERS (sore throat, sores in mouth, or a toothache) UNUSUAL RASH, SWELLING OR PAIN  UNUSUAL VAGINAL DISCHARGE OR ITCHING   Items with * indicate a potential emergency and should be followed up as soon as possible or go to the Emergency Department if any problems should occur.  Please show the CHEMOTHERAPY ALERT CARD or IMMUNOTHERAPY ALERT CARD at check-in  to the Emergency Department and triage nurse.  Should you have questions after your visit or need to cancel or reschedule your appointment, please contact [facility]  Dept: [phone]  and follow the prompts.  Office hours are 8:00 a.m. to 4:30 p.m. Monday - Friday. Please note that voicemails left after 4:00 p.m. may not be returned until the following business day.  We are closed weekends and major holidays. You have access to a nurse at all times for urgent questions. Please call the main number to the clinic Dept: [phone] and follow the prompts.   For any non-urgent questions, you may also contact your provider using MyChart. We now offer e-Visits for anyone 47 and older to request care online for non-urgent symptoms. For details visit [email or web address].   Also download the MyChart app! Go to the app store, search "MyChart", open the app, select [facility], and log in with your MyChart username and password.  Due to Covid, a mask is required upon entering the hospital/clinic. If you do not have a mask, one will be given to you upon arrival. For doctor visits, patients may have 1 support person aged 47 or older with them. For treatment visits, patients cannot have anyone with them due to current Covid guidelines and our immunocompromised population.   Gemcitabine injection What is this medication? GEMCITABINE (jem SYE ta been) is a chemotherapy drug. This medicine is used to treat many types of cancer like breast cancer, lung cancer, pancreatic cancer, and ovarian cancer. This medicine may be used for other purposes; ask your health care provider  or pharmacist if you have questions. COMMON BRAND NAME(S): Gemzar, Infugem What should I tell my care team before I take this medication? They need to know if you have any of these conditions: blood disorders infection kidney disease liver disease lung or breathing disease, like asthma recent or ongoing  radiation therapy an unusual or allergic reaction to gemcitabine, other chemotherapy, other medicines, foods, dyes, or preservatives pregnant or trying to get pregnant breast-feeding How should I use this medication? This drug is given as an infusion into a vein. It is administered in a hospital or clinic by a specially trained health care professional. Talk to your pediatrician regarding the use of this medicine in children. Special care may be needed. Overdosage: If you think you have taken too much of this medicine contact a poison control center or emergency room at once. NOTE: This medicine is only for you. Do not share this medicine with others. What if I miss a dose? It is important not to miss your dose. Call your doctor or health care professional if you are unable to keep an appointment. What may interact with this medication? medicines to increase blood counts like filgrastim, pegfilgrastim, sargramostim some other chemotherapy drugs like cisplatin vaccines Talk to your doctor or health care professional before taking any of these medicines: acetaminophen aspirin ibuprofen ketoprofen naproxen This list may not describe all possible interactions. Give your health care provider a list of all the medicines, herbs, non-prescription drugs, or dietary supplements you use. Also tell them if you smoke, drink alcohol, or use illegal drugs. Some items may interact with your medicine. What should I watch for while using this medication? Visit your doctor for checks on your progress. This drug may make you feel generally unwell. This is not uncommon, as chemotherapy can affect healthy cells as well as cancer cells. Report any side effects. Continue your course of treatment even though you feel ill unless your doctor tells you to stop. In some cases, you may be given additional medicines to help with side effects. Follow all directions for their use. Call your doctor or health care  professional for advice if you get a fever, chills or sore throat, or other symptoms of a cold or flu. Do not treat yourself. This drug decreases your body's ability to fight infections. Try to avoid being around people who are sick. This medicine may increase your risk to bruise or bleed. Call your doctor or health care professional if you notice any unusual bleeding. Be careful brushing and flossing your teeth or using a toothpick because you may get an infection or bleed more easily. If you have any dental work done, tell your dentist you are receiving this medicine. Avoid taking products that contain aspirin, acetaminophen, ibuprofen, naproxen, or ketoprofen unless instructed by your doctor. These medicines may hide a fever. Do not become pregnant while taking this medicine or for 6 months after stopping it. Women should inform their doctor if they wish to become pregnant or think they might be pregnant. Men should not father a child while taking this medicine and for 3 months after stopping it. There is a potential for serious side effects to an unborn child. Talk to your health care professional or pharmacist for more information. Do not breast-feed an infant while taking this medicine or for at least 1 week after stopping it. Men should inform their doctors if they wish to father a child. This medicine may lower sperm counts. Talk with your doctor or health  care professional if you are concerned about your fertility. What side effects may I notice from receiving this medication? Side effects that you should report to your doctor or health care professional as soon as possible: allergic reactions like skin rash, itching or hives, swelling of the face, lips, or tongue breathing problems pain, redness, or irritation at site where injected signs and symptoms of a dangerous change in heartbeat or heart rhythm like chest pain; dizziness; fast or irregular heartbeat; palpitations; feeling faint or  lightheaded, falls; breathing problems signs of decreased platelets or bleeding - bruising, pinpoint red spots on the skin, black, tarry stools, blood in the urine signs of decreased red blood cells - unusually weak or tired, feeling faint or lightheaded, falls signs of infection - fever or chills, cough, sore throat, pain or difficulty passing urine signs and symptoms of kidney injury like trouble passing urine or change in the amount of urine signs and symptoms of liver injury like dark yellow or brown urine; general ill feeling or flu-like symptoms; light-colored stools; loss of appetite; nausea; right upper belly pain; unusually weak or tired; yellowing of the eyes or skin swelling of ankles, feet, hands Side effects that usually do not require medical attention (report to your doctor or health care professional if they continue or are bothersome): constipation diarrhea hair loss loss of appetite nausea rash vomiting This list may not describe all possible side effects. Call your doctor for medical advice about side effects. You may report side effects to FDA at 1-800-FDA-1088. Where should I keep my medication? This drug is given in a hospital or clinic and will not be stored at home. NOTE: This sheet is a summary. It may not cover all possible information. If you have questions about this medicine, talk to your doctor, pharmacist, or health care provider.  2022 Elsevier/Gold Standard (2017-10-25 00:00:00)

## 2021-08-20 NOTE — Research (Signed)
MTG-015 - Tissue and Bodily Fluids: Translational Medicine: Discovery and Evaluation of Biomarkers/Pharmacogenomics for the Diagnosis and Personalized Management of Patients   This Nurse has reviewed this patient's inclusion and exclusion criteria and confirmed Angel Celeste Sr. is eligible for study participation.  Patient will continue with enrollment.  Vickii Penna, RN, BSN, CPN Clinical Research Nurse I [phone]  08/20/2021 12:25 PM

## 2021-08-20 NOTE — Research (Signed)
MTG-015 - Tissue and Bodily Fluids: Translational Medicine: Discovery and Evaluation of Biomarkers/Pharmacogenomics for the Diagnosis and Personalized Management of Patients   08/20/21  Informed Consent: Patient Angel GAUTREAU Sr. was identified by Dr. Burr Medico as a potential candidate for the above listed study.  This Clinical Research Coordinator met with Angel Shartzer., MRN [id number], on 08/20/21 in a manner and location that ensures patient privacy to discuss participation in the above listed research study.  Patient is Accompanied by his daughter, Angel Keller .  A copy of the informed consent document and separate HIPAA Authorization was provided to the patient.  Patient reads, speaks, and understands [place].    Patient was provided with the business card of this Coordinator and encouraged to contact the research team with any questions.  Patient was provided the option of taking informed consent documents home to review and was encouraged to review at their convenience with their support network, including other care providers. Patient is comfortable with making a decision regarding study participation today.  As outlined in the informed consent form, this Coordinator and [place]. discussed the purpose of the research study, the investigational nature of the study, study procedures and requirements for study participation, potential risks and benefits of study participation, as well as alternatives to participation. This study is not blinded. The patient understands participation is voluntary and they may withdraw from study participation at any time.  This study does not involve randomization.  This study does not involve an investigational drug or device. This study does not involve a placebo. Patient understands enrollment is pending full eligibility review.   Confidentiality and how the patient's information will be used as part of study participation were discussed.  Patient was informed there  is reimbursement provided for their time and effort spent on trial participation.  The patient is encouraged to discuss research study participation with their insurance provider to determine what costs they may incur as part of study participation, including research related injury.    All questions were answered to patient's satisfaction.  The informed consent and separate HIPAA Authorization was reviewed page by page.  The patient's mental and emotional status is appropriate to provide informed consent, and the patient verbalizes an understanding of study participation.  Patient has agreed to participate in the above listed research study and has voluntarily signed the informed consent IRB approved 07/05/2021 and separate HIPAA Authorization, version 5, IRB aproved 07/05/2021  on 08/20/21 at 12:25 PM.  The patient was provided with a copy of the signed informed consent form and separate HIPAA Authorization for their reference.  No study specific procedures were obtained prior to the signing of the informed consent document.  Approximately 20 minutes were spent with the patient reviewing the informed consent documents.  Patient was not requested to complete a Release of Information form.  Eligibility:  This Coordinator has reviewed this patient's inclusion and exclusion criteria and confirmed Angel Celeste Sr. is eligible for study participation.  Patient will continue with enrollment.  Medication Review: Reviewed the patient's medication list for accuracy.  Patient states there have been no changes to the medication list.    Specimen Collection:  Research blood specimen was collected at 1312.  Gift Card:  The patient was given a $50 visa gift card.   Clabe Seal Clinical Research Coordinator I  08/20/21 12:30 PM

## 2021-08-21 LAB — CANCER ANTIGEN 19-9: CA 19-9: 302 U/mL — ABNORMAL HIGH (ref 0–35)

## 2021-08-23 ENCOUNTER — Ambulatory Visit: Payer: Medicare HMO | Admitting: Physician Assistant

## 2021-08-23 ENCOUNTER — Ambulatory Visit: Payer: Medicare HMO | Admitting: Hematology

## 2021-08-23 ENCOUNTER — Ambulatory Visit: Payer: Medicare HMO | Admitting: Gastroenterology

## 2021-08-23 ENCOUNTER — Ambulatory Visit: Payer: Medicare HMO

## 2021-08-23 ENCOUNTER — Other Ambulatory Visit: Payer: Medicare HMO

## 2021-08-24 ENCOUNTER — Ambulatory Visit: Payer: Medicare HMO

## 2021-08-24 ENCOUNTER — Ambulatory Visit: Payer: Medicare HMO | Admitting: Hematology

## 2021-08-24 ENCOUNTER — Other Ambulatory Visit: Payer: Medicare HMO

## 2021-08-26 ENCOUNTER — Ambulatory Visit (INDEPENDENT_AMBULATORY_CARE_PROVIDER_SITE_OTHER): Payer: Medicare HMO | Admitting: Nurse Practitioner

## 2021-08-26 ENCOUNTER — Other Ambulatory Visit: Payer: Self-pay

## 2021-08-26 ENCOUNTER — Encounter: Payer: Self-pay | Admitting: Nurse Practitioner

## 2021-08-26 VITALS — BP 106/62 | HR 62 | Temp 97.3°F | Ht 69.0 in | Wt 149.4 lb

## 2021-08-26 DIAGNOSIS — R634 Abnormal weight loss: Secondary | ICD-10-CM

## 2021-08-26 DIAGNOSIS — C251 Malignant neoplasm of body of pancreas: Secondary | ICD-10-CM | POA: Diagnosis not present

## 2021-08-26 DIAGNOSIS — Z0000 Encounter for general adult medical examination without abnormal findings: Secondary | ICD-10-CM

## 2021-08-26 DIAGNOSIS — F439 Reaction to severe stress, unspecified: Secondary | ICD-10-CM

## 2021-08-26 DIAGNOSIS — E039 Hypothyroidism, unspecified: Secondary | ICD-10-CM | POA: Diagnosis not present

## 2021-08-26 MED ORDER — SYNTHROID 112 MCG PO TABS: 112.0000 ug | ORAL_TABLET | Freq: Every day | ORAL | 1 refills | Status: DC

## 2021-08-26 MED ORDER — MIRTAZAPINE 7.5 MG PO TABS: 7.5000 mg | ORAL_TABLET | Freq: Every day | ORAL | 0 refills | Status: DC

## 2021-08-26 NOTE — Progress Notes (Signed)
Subjective:   Angel Keller. is a 81 y.o. male who presents for Medicare Annual/Subsequent preventive examination. 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 Has had one chemotherapy treatment and will see his oncologist tomorrow.  He is concerned about 6 pound weight loss since his last visit with me. Has limited appetite since just prior to his diagnosis of pancreatic cancer. He states that he doesn't get hungry as often and gets full very fast. Continues to have epigastric pain. He is also moderately constipated. Did do trial of miralax daily for seven days. This was effective. Stopped fr a little while and then becae very constipated. Has restarted the miralax, but is taking a while to get bowels back on track. States that he spends a lot of time in the bathroom. Takes him a very long time to produce just a small amount of hard stool. States that it is painful to have a bowel movement.  States that he is easily fatigued and winded. This is new since his recent diagnosis of pancreatic cancer.   Review of Systems    Review of Systems  Constitutional:  Positive for malaise/fatigue and weight loss. Negative for fever.       Six pounds weight loss since his last visit   HENT:  Negative for congestion, ear discharge, ear pain, hearing loss and sore throat.   Eyes: Negative.   Respiratory:  Positive for shortness of breath. Negative for cough and wheezing.   Cardiovascular:  Negative for chest pain and orthopnea.  Gastrointestinal:  Positive for abdominal pain, constipation and heartburn. Negative for blood in stool, diarrhea, nausea and vomiting.  Genitourinary:  Negative for dysuria, flank pain, frequency and urgency.  Musculoskeletal:  Negative for back pain and myalgias.  Skin:  Negative for itching and rash.  Neurological:  Negative for dizziness, weakness and headaches.  Endo/Heme/Allergies:        Hypothyroid  Psychiatric/Behavioral:  Positive for substance abuse.  Negative for depression. The patient is nervous/anxious.          Objective:   Physical Exam Vitals and nursing note reviewed.  Constitutional:      Appearance: Normal appearance. He is well-developed. He is ill-appearing.  HENT:     Head: Normocephalic and atraumatic.     Nose: Nose normal.     Mouth/Throat:     Mouth: Mucous membranes are moist.     Pharynx: Oropharynx is clear.  Eyes:     Extraocular Movements: Extraocular movements intact.     Conjunctiva/sclera: Conjunctivae normal.     Pupils: Pupils are equal, round, and reactive to light.  Cardiovascular:     Rate and Rhythm: Normal rate and regular rhythm.     Pulses: Normal pulses.     Heart sounds: Normal heart sounds.     Comments: Ort a cath present In right upper chest  Pulmonary:     Effort: Pulmonary effort is normal.     Breath sounds: Normal breath sounds.  Abdominal:     General: Bowel sounds are normal. There is distension.     Palpations: Abdomen is soft. There is no mass.     Tenderness: There is abdominal tenderness. There is no guarding or rebound.     Hernia: No hernia is present.     Comments: Intact drain present in right upper quadrant of the abdomen. Insertion site covered with a clean and dry dressing.   Musculoskeletal:        General: Normal range  of motion.     Cervical back: Normal range of motion and neck supple.  Lymphadenopathy:     Cervical: No cervical adenopathy.  Skin:    General: Skin is warm and dry.     Capillary Refill: Capillary refill takes less than 2 seconds.  Neurological:     General: No focal deficit present.     Mental Status: He is alert and oriented to person, place, and time. Mental status is at baseline.  Psychiatric:        Mood and Affect: Mood normal.        Behavior: Behavior normal.        Thought Content: Thought content normal.        Judgment: Judgment normal.    Today's Vitals   08/26/21 1327  BP: 106/62  Pulse: 62  Temp: (!) 97.3 F (36.3 C)   SpO2: 97%  Weight: 149 lb 6.4 oz (67.8 kg)  Height: 5\' 9"  (1.753 m)   Body mass index is 22.06 kg/m.    Advanced Directives 08/13/2021 08/05/2021 07/30/2021 07/19/2021 07/28/2020 07/27/2020 02/20/2019  Does Patient Have a Medical Advance Directive? No Yes Yes Yes - No No  Type of Advance Directive - [place];Living will [place];Living will [place] - - -  Does patient want to make changes to medical advance directive? - - No - Patient declined No - Patient declined - - -  Copy of [place] in Chart? - No - copy requested No - copy requested No - copy requested - - -  Would patient like information on creating a medical advance directive? - - - - No - Patient declined - No - Patient declined    Current Medications (verified) Outpatient Encounter Medications as of 08/26/2021  Medication Sig   aspirin EC 81 MG tablet Take 1 tablet (81 mg total) by mouth daily. Hold while on blood thinner/eliquis for 3 months, then resume (Patient taking differently: Take 81 mg by mouth daily.)   bisoprolol-hydrochlorothiazide (ZIAC) 2.5-6.25 MG tablet Take 1 tablet by mouth every morning.   Cholecalciferol (VITAMIN D3) 50 MCG (2000 UT) TABS Take 2,000 Units by mouth daily.   escitalopram (LEXAPRO) 20 MG tablet Take 20 mg by mouth daily.   famotidine (PEPCID) 20 MG tablet Take 1 tablet (20 mg total) by mouth 2 (two) times daily.   lidocaine-prilocaine (EMLA) cream Apply to affected area once   mirtazapine (REMERON) 7.5 MG tablet Take 1 tablet (7.5 mg total) by mouth at bedtime.   ondansetron (ZOFRAN) 8 MG tablet Take 1 tablet (8 mg total) by mouth 2 (two) times daily as needed (Nausea or vomiting).   ondansetron (ZOFRAN-ODT) 4 MG disintegrating tablet Take 1 tablet (4 mg total) by mouth every 8 (eight) hours as needed for nausea or vomiting.   oxyCODONE (OXY IR/ROXICODONE) 5 MG immediate release tablet Take 1 tablet (5 mg total) by  mouth every 6 (six) hours as needed for moderate pain.   pantoprazole (PROTONIX) 40 MG tablet Take 1 tablet (40 mg total) by mouth daily.   prochlorperazine (COMPAZINE) 10 MG tablet Take 1 tablet (10 mg total) by mouth every 6 (six) hours as needed (Nausea or vomiting).   Sodium Chloride Flush (NORMAL SALINE FLUSH) 0.9 % SOLN Flush tube once daily with 1 syringe.   sucralfate (CARAFATE) 1 GM/10ML suspension Take 10 mLs (1 g total) by mouth 2 (two) times daily. (Patient taking differently: Take 1 g by mouth  daily.)   vitamin B-12 (CYANOCOBALAMIN) 1000 MCG tablet Take 1,000 mcg by mouth 2 (two) times daily.   [DISCONTINUED] SYNTHROID 112 MCG tablet Take 112 mcg by mouth daily before breakfast.   SYNTHROID 112 MCG tablet Take 1 tablet (112 mcg total) by mouth daily before breakfast.   No facility-administered encounter medications on file as of 08/26/2021.    Allergies (verified) Patient has no known allergies.   History: Past Medical History:  Diagnosis Date   Aneurysm ([place])    2017   Anxiety    Arthritis    Depression    Headache    HLD (hyperlipidemia)    Hx of inguinal hernia repair    Hypertension    Hypothyroidism    Overactive bladder    Pancreatic cancer (H[facility])    Pneumonia    Shingles    Sleep apnea    Patient denies   Thoracic ascending aortic aneurysm    mildly dilated 4.0cm per 08/12/21 CT   Thyroid disease    Varicose veins of both lower extremities    Past Surgical History:  Procedure Laterality Date   BILATERAL CARPAL TUNNEL RELEASE     CATARACT EXTRACTION, BILATERAL Bilateral    ELBOW SURGERY Bilateral    ENDOSCOPIC RETROGRADE CHOLANGIOPANCREATOGRAPHY (ERCP) WITH PROPOFOL N/A 07/20/2021   Procedure: ENDOSCOPIC RETROGRADE CHOLANGIOPANCREATOGRAPHY (ERCP) WITH PROPOFOL;  Surgeon: Gatha Mayer, MD;  Location[facility] ENDOSCOPY;  Service: Endoscopy;  Laterality: N/A;   ESOPHAGOGASTRODUODENOSCOPY (EGD) WITH PROPOFOL N/A 08/05/2021   Procedure:  ESOPHAGOGASTRODUODENOSCOPY (EGD) WITH PROPOFOL;  Surgeon: Rush Landmark Telford Nab., MD;  Location: [place];  Service: Gastroenterology;  Laterality: N/A;   EUS N/A 08/05/2021   Procedure: UPPER ENDOSCOPIC ULTRASOUND (EUS) RADIAL;  Surgeon: Irving Copas., MD;  Location: [place];  Service: Gastroenterology;  Laterality: N/A;   FINE NEEDLE ASPIRATION  08/05/2021   Procedure: FINE NEEDLE ASPIRATION (FNA) LINEAR;  Surgeon: Irving Copas., MD;  Location: [place];  Service: Gastroenterology;;   INGUINAL HERNIA REPAIR Right 1951   IR ANGIO INTRA EXTRACRAN SEL COM CAROTID INNOMINATE BILAT MOD SED  02/20/2019   IR ENDOLUMINAL BX OF BILIARY TREE  07/22/2021   IR GENERIC HISTORICAL  10/28/2016   IR RADIOLOGIST EVAL & MGMT 10/28/2016 [facility]   IR INT EXT BILIARY DRAIN WITH CHOLANGIOGRAM  07/22/2021   PORTACATH PLACEMENT N/A 08/19/2021   Procedure: INSERTION PORT-A-CATH;  Surgeon: Dwan Bolt, MD;  Location: [facility];  Service: General;  Laterality: N/A;  LMA   RADIOLOGY WITH ANESTHESIA N/A 10/21/2015   Procedure: RADIOLOGY WITH ANESTHESIA;  Surgeon: Luanne Bras, MD;  Location: [place];  Service: Radiology;  Laterality: N/A;   RADIOLOGY WITH ANESTHESIA N/A 02/20/2019   Procedure: [employer];  Surgeon: Luanne Bras, MD;  Location: [place];  Service: Radiology;  Laterality: N/A;   ROTATOR CUFF REPAIR Bilateral    VARICOSE VEIN SURGERY     Family History  Problem Relation Age of Onset   Breast cancer Mother    Lung cancer Father    Alcoholism Sister    Cirrhosis Sister    Colon cancer Neg Hx    Esophageal cancer Neg Hx    Liver cancer Neg Hx    Pancreatic cancer Neg Hx    Prostate cancer Neg Hx    Social History   Socioeconomic History   Marital status: Widowed    Spouse name: Not on file   Number of children: 5   Years of education: Not on file   Highest education level:  Not on file  Occupational History   Occupation: retired  Tobacco Use   Smoking  status: Former    Packs/day: 0.25    Years: 1.00    Pack years: 0.25    Types: Cigarettes    Quit date: 08/15/1961    Years since quitting: 60.0   Smokeless tobacco: Never  Vaping Use   Vaping Use: Never used  Substance and Sexual Activity   Alcohol use: No   Drug use: No   Sexual activity: Not Currently  Other Topics Concern   Not on file  Social History Narrative   Not on file   Social Determinants of Health   Financial Resource Strain: Not on file  Food Insecurity: Not on file  Transportation Needs: Not on file  Physical Activity: Not on file  Stress: Not on file  Social Connections: Not on file    Tobacco Counseling Patient is a former smoker   Diabetic?no  Activities of Daily Living In your present state of health, do you have any difficulty performing the following activities: 08/26/2021 08/13/2021  Hearing? N -  [place]? Y -  Difficulty concentrating or making decisions? N -  Walking or climbing stairs? N -  Comment - -  Dressing or bathing? N -  Doing errands, shopping? N N  Some recent data might be hidden    Patient Care Team: Ronnell Freshwater, NP as PCP - General (Family Medicine) Truitt Merle, MD as Consulting Physician (Oncology) Royston Bake, RN as Oncology Nurse Navigator (Oncology)  Indicate any recent Medical Services you may have received from other than [facility] providers in the past year (date may be approximate).     Assessment:  1. Encounter for Medicare annual wellness exam Annual medicare wellness visit today  2. Acquired hypothyroidism Will have patient continue 146mcg daily. Recheck thyroid panel in 3 to 4 months and adjust levothyroxine dose as indicated - SYNTHROID 112 MCG tablet; Take 1 tablet (112 mcg total) by mouth daily before breakfast.  Dispense: 90 tablet; Refill: 1  3. Malignant neoplasm of body of pancreas [facility]) Patient now being treated for pancreatic cancer stage 1B. Due to have 2nd chemotherapy treatment  tomorrow.   4. Abnormal weight loss Patient concerned about 6 pound weight loss since last visit. Explained that fatigue and weight loss is common when undergoing chemotherapy treatment. Trial of mirtazapine 7.5mg  every evening. Will reassess I n6 weeks.   5. Situational stress Trial of remeron 7.5mg  every evening. Continue lexapro as previously prescribed. Reassess at next visit  - mirtazapine (REMERON) 7.5 MG tablet; Take 1 tablet (7.5 mg total) by mouth at bedtime.  Dispense: 90 tablet; Refill: 0   Hearing/Vision screen No results found.     Goals Addressed   None   Depression Screen PHQ 2/9 Scores 08/26/2021 07/02/2021  PHQ - 2 Score 4 1  PHQ- 9 Score 10 3    Fall Risk Fall Risk  08/26/2021 07/02/2021  Falls in the past year? 0 0  Number falls in past yr: 0 0  Injury with Fall? 0 0  Follow up Falls evaluation completed Falls evaluation completed    [place]:  Any stairs in or around the home? No  If so, are there any without handrails? No  Home free of loose throw rugs in walkways, pet beds, electrical cords, etc? Yes  Adequate lighting in your home to reduce risk of falls? Yes   ASSISTIVE DEVICES UTILIZED  TO PREVENT FALLS:  Life alert? No  Use of a cane, walker or w/c? No  Grab bars in the bathroom? No  Shower chair or bench in shower? No  Elevated toilet seat or a handicapped toilet? No   TIMED UP AND GO:  Was the test performed? Yes .  Length of time to ambulate 10 feet: 15 sec.   Gait steady and fast without use of assistive device  Cognitive Function:     6CIT Screen 08/26/2021  What Year? 0 points  What time? 0 points  Count back from 20 0 points  Months in reverse 0 points  Repeat phrase 0 points    Immunizations Immunization History  Administered Date(s) Administered   H1N1 11/07/2008   Influenza Whole 06/13/2007, 04/23/2009   Influenza, High Dose Seasonal PF 04/09/2018   PFIZER(Purple Top)SARS-COV-2  Vaccination 12/18/2019, 01/15/2020   Pneumococcal Polysaccharide-23 11/07/2008   Td 11/07/2008    TDAP status: Due, Education has been provided regarding the importance of this vaccine. Advised may receive this vaccine at local pharmacy or Health Dept. Aware to provide a copy of the vaccination record if obtained from local pharmacy or Health Dept. Verbalized acceptance and understanding.  Flu Vaccine status: Due, Education has been provided regarding the importance of this vaccine. Advised may receive this vaccine at local pharmacy or Health Dept. Aware to provide a copy of the vaccination record if obtained from local pharmacy or Health Dept. Verbalized acceptance and understanding.  Pneumococcal vaccine status: Due, Education has been provided regarding the importance of this vaccine. Advised may receive this vaccine at local pharmacy or Health Dept. Aware to provide a copy of the vaccination record if obtained from local pharmacy or Health Dept. Verbalized acceptance and understanding.  Covid-19 vaccine status: Completed vaccines  Qualifies for Shingles Vaccine? Yes   Zostavax completed No   Shingrix Completed?: No.    Education has been provided regarding the importance of this vaccine. Patient has been advised to call insurance company to determine out of pocket expense if they have not yet received this vaccine. Advised may also receive vaccine at local pharmacy or Health Dept. Verbalized acceptance and understanding.  Screening Tests Health Maintenance  Topic Date Due   Zoster Vaccines- Shingrix (1 of 2) Never done   Pneumonia Vaccine 60+ Years old (2 - PCV) 11/07/2009   TETANUS/TDAP  11/08/2018   COVID-19 Vaccine (3 - Pfizer risk series) 02/12/2020   INFLUENZA VACCINE  03/15/2021   HPV VACCINES  Aged Out    Health Maintenance  Health Maintenance Due  Topic Date Due   Zoster Vaccines- Shingrix (1 of 2) Never done   Pneumonia Vaccine 76+ Years old (2 - PCV) 11/07/2009    TETANUS/TDAP  11/08/2018   COVID-19 Vaccine (3 - Pfizer risk series) 02/12/2020   INFLUENZA VACCINE  03/15/2021    Colorectal cancer screening: No longer required.   Lung Cancer Screening: (Low Dose CT Chest recommended if Age 66-80 years, 30 pack-year currently smoking OR have quit w/in 15years.) does not qualify.   Lung Cancer Screening Referral: no   Additional Screening:  Hepatitis C Screening: does not qualify; Completed no  Vision Screening: Recommended annual ophthalmology exams for early detection of glaucoma and other disorders of the eye. Is the patient up to date with their annual eye exam?  Yes  Who is the provider or what is the name of the office in which the patient attends annual eye exams? [facility] If pt is not established  with a provider, would they like to be referred to a provider to establish care? No .   Dental Screening: Recommended annual dental exams for proper oral hygiene  Community Resource Referral / Chronic Care Management: CRR required this visit?  No   CCM required this visit?  No      Plan:     I have personally reviewed and noted the following in the patients chart:   Medical and social history Use of alcohol, tobacco or illicit drugs  Current medications and supplements including opioid prescriptions. Patient is not currently taking opioid prescriptions. Functional ability and status Nutritional status Physical activity Advanced directives List of other physicians Hospitalizations, surgeries, and ER visits in previous 12 months Vitals Screenings to include cognitive, depression, and falls Referrals and appointments  In addition, I have reviewed and discussed with patient certain preventive protocols, quality metrics, and best practice recommendations. A written personalized care plan for preventive services as well as general preventive health recommendations were provided to patient.     Leretha Pol, FNP-c  08/26/2021

## 2021-08-27 ENCOUNTER — Ambulatory Visit: Payer: Medicare HMO

## 2021-08-27 ENCOUNTER — Inpatient Hospital Stay: Payer: Medicare HMO | Admitting: Hematology

## 2021-08-27 ENCOUNTER — Inpatient Hospital Stay: Payer: Medicare HMO

## 2021-08-27 ENCOUNTER — Other Ambulatory Visit: Payer: Self-pay

## 2021-08-27 ENCOUNTER — Inpatient Hospital Stay (HOSPITAL_COMMUNITY)
Admission: EM | Admit: 2021-08-27 | Discharge: 2021-08-31 | DRG: 871 | Disposition: A | Discharge status: Home / Self Care | Payer: Medicare HMO | Attending: Internal Medicine | Admitting: Internal Medicine

## 2021-08-27 ENCOUNTER — Emergency Department (HOSPITAL_COMMUNITY): Payer: Medicare HMO

## 2021-08-27 VITALS — BP 141/73 | HR 60 | Temp 98.6°F | Resp 17 | Wt 146.6 lb

## 2021-08-27 DIAGNOSIS — R509 Fever, unspecified: Secondary | ICD-10-CM | POA: Diagnosis not present

## 2021-08-27 DIAGNOSIS — C251 Malignant neoplasm of body of pancreas: Secondary | ICD-10-CM | POA: Diagnosis present

## 2021-08-27 DIAGNOSIS — I959 Hypotension, unspecified: Secondary | ICD-10-CM

## 2021-08-27 DIAGNOSIS — E876 Hypokalemia: Secondary | ICD-10-CM

## 2021-08-27 DIAGNOSIS — E039 Hypothyroidism, unspecified: Secondary | ICD-10-CM | POA: Diagnosis present

## 2021-08-27 DIAGNOSIS — Z9221 Personal history of antineoplastic chemotherapy: Secondary | ICD-10-CM

## 2021-08-27 DIAGNOSIS — E871 Hypo-osmolality and hyponatremia: Secondary | ICD-10-CM

## 2021-08-27 DIAGNOSIS — F32A Depression, unspecified: Secondary | ICD-10-CM | POA: Diagnosis present

## 2021-08-27 DIAGNOSIS — K831 Obstruction of bile duct: Secondary | ICD-10-CM | POA: Diagnosis present

## 2021-08-27 DIAGNOSIS — E860 Dehydration: Secondary | ICD-10-CM | POA: Diagnosis present

## 2021-08-27 DIAGNOSIS — F419 Anxiety disorder, unspecified: Secondary | ICD-10-CM | POA: Diagnosis present

## 2021-08-27 DIAGNOSIS — Z801 Family history of malignant neoplasm of trachea, bronchus and lung: Secondary | ICD-10-CM

## 2021-08-27 DIAGNOSIS — R6510 Systemic inflammatory response syndrome (SIRS) of non-infectious origin without acute organ dysfunction: Secondary | ICD-10-CM | POA: Diagnosis present

## 2021-08-27 DIAGNOSIS — E43 Unspecified severe protein-calorie malnutrition: Secondary | ICD-10-CM | POA: Insufficient documentation

## 2021-08-27 DIAGNOSIS — I10 Essential (primary) hypertension: Secondary | ICD-10-CM | POA: Diagnosis present

## 2021-08-27 DIAGNOSIS — Z811 Family history of alcohol abuse and dependence: Secondary | ICD-10-CM

## 2021-08-27 DIAGNOSIS — Z7982 Long term (current) use of aspirin: Secondary | ICD-10-CM

## 2021-08-27 DIAGNOSIS — N3281 Overactive bladder: Secondary | ICD-10-CM | POA: Diagnosis present

## 2021-08-27 DIAGNOSIS — Z20822 Contact with and (suspected) exposure to covid-19: Secondary | ICD-10-CM | POA: Diagnosis present

## 2021-08-27 DIAGNOSIS — A419 Sepsis, unspecified organism: Secondary | ICD-10-CM | POA: Diagnosis not present

## 2021-08-27 DIAGNOSIS — Z95828 Presence of other vascular implants and grafts: Secondary | ICD-10-CM

## 2021-08-27 DIAGNOSIS — C259 Malignant neoplasm of pancreas, unspecified: Secondary | ICD-10-CM | POA: Diagnosis present

## 2021-08-27 DIAGNOSIS — Z7901 Long term (current) use of anticoagulants: Secondary | ICD-10-CM

## 2021-08-27 DIAGNOSIS — E785 Hyperlipidemia, unspecified: Secondary | ICD-10-CM | POA: Diagnosis present

## 2021-08-27 DIAGNOSIS — Z79899 Other long term (current) drug therapy: Secondary | ICD-10-CM

## 2021-08-27 DIAGNOSIS — Z6372 Alcoholism and drug addiction in family: Secondary | ICD-10-CM

## 2021-08-27 DIAGNOSIS — R9431 Abnormal electrocardiogram [ECG] [EKG]: Secondary | ICD-10-CM

## 2021-08-27 DIAGNOSIS — Z79890 Hormone replacement therapy (postmenopausal): Secondary | ICD-10-CM

## 2021-08-27 DIAGNOSIS — Z8507 Personal history of malignant neoplasm of pancreas: Secondary | ICD-10-CM

## 2021-08-27 DIAGNOSIS — Z87891 Personal history of nicotine dependence: Secondary | ICD-10-CM

## 2021-08-27 DIAGNOSIS — C250 Malignant neoplasm of head of pancreas: Secondary | ICD-10-CM

## 2021-08-27 DIAGNOSIS — Z803 Family history of malignant neoplasm of breast: Secondary | ICD-10-CM

## 2021-08-27 DIAGNOSIS — I480 Paroxysmal atrial fibrillation: Secondary | ICD-10-CM

## 2021-08-27 LAB — CBC WITH DIFFERENTIAL/PLATELET
Abs Immature Granulocytes: 0.05 10*3/uL (ref 0.00–0.07)
Basophils Absolute: 0 10*3/uL (ref 0.0–0.1)
Basophils Relative: 0 %
Eosinophils Absolute: 0 10*3/uL (ref 0.0–0.5)
Eosinophils Relative: 0 %
HCT: 33.5 % — ABNORMAL LOW (ref 39.0–52.0)
Hemoglobin: 11.3 g/dL — ABNORMAL LOW (ref 13.0–17.0)
Immature Granulocytes: 1 %
Lymphocytes Relative: 17 %
Lymphs Abs: 0.7 10*3/uL (ref 0.7–4.0)
MCH: 27.1 pg (ref 26.0–34.0)
MCHC: 33.7 g/dL (ref 30.0–36.0)
MCV: 80.3 fL (ref 80.0–100.0)
Monocytes Absolute: 0.2 10*3/uL (ref 0.1–1.0)
Monocytes Relative: 5 %
Neutro Abs: 3.1 10*3/uL (ref 1.7–7.7)
Neutrophils Relative %: 77 %
Platelets: 141 10*3/uL — ABNORMAL LOW (ref 150–400)
RBC: 4.17 MIL/uL — ABNORMAL LOW (ref 4.22–5.81)
RDW: 14.3 % (ref 11.5–15.5)
WBC: 4 10*3/uL (ref 4.0–10.5)
nRBC: 0 % (ref 0.0–0.2)

## 2021-08-27 LAB — CMP (CANCER CENTER ONLY)
ALT: 38 U/L (ref 0–44)
AST: 36 U/L (ref 15–41)
Albumin: 3.7 g/dL (ref 3.5–5.0)
Alkaline Phosphatase: 128 U/L — ABNORMAL HIGH (ref 38–126)
Anion gap: 9 (ref 5–15)
BUN: 24 mg/dL — ABNORMAL HIGH (ref 8–23)
CO2: 28 mmol/L (ref 22–32)
Calcium: 9.2 mg/dL (ref 8.9–10.3)
Chloride: 95 mmol/L — ABNORMAL LOW (ref 98–111)
Creatinine: 1.21 mg/dL (ref 0.61–1.24)
GFR, Estimated: >60 mL/min (ref >60)
Glucose, Bld: 108 mg/dL — ABNORMAL HIGH (ref 70–99)
Potassium: 3.9 mmol/L (ref 3.5–5.1)
Sodium: 132 mmol/L — ABNORMAL LOW (ref 135–145)
Total Bilirubin: 2.2 mg/dL — ABNORMAL HIGH (ref 0.3–1.2)
Total Protein: 7.2 g/dL (ref 6.5–8.1)

## 2021-08-27 LAB — LACTIC ACID, PLASMA: Lactic Acid, Venous: 1.4 mmol/L (ref 0.5–1.9)

## 2021-08-27 LAB — CBC WITH DIFFERENTIAL (CANCER CENTER ONLY)
Abs Immature Granulocytes: 0.01 10*3/uL (ref 0.00–0.07)
Basophils Absolute: 0 10*3/uL (ref 0.0–0.1)
Basophils Relative: 0 %
Eosinophils Absolute: 0 10*3/uL (ref 0.0–0.5)
Eosinophils Relative: 0 %
HCT: 32.9 % — ABNORMAL LOW (ref 39.0–52.0)
Hemoglobin: 11.3 g/dL — ABNORMAL LOW (ref 13.0–17.0)
Immature Granulocytes: 0 %
Lymphocytes Relative: 38 %
Lymphs Abs: 1.8 10*3/uL (ref 0.7–4.0)
MCH: 27 pg (ref 26.0–34.0)
MCHC: 34.3 g/dL (ref 30.0–36.0)
MCV: 78.7 fL — ABNORMAL LOW (ref 80.0–100.0)
Monocytes Absolute: 0.3 10*3/uL (ref 0.1–1.0)
Monocytes Relative: 7 %
Neutro Abs: 2.6 10*3/uL (ref 1.7–7.7)
Neutrophils Relative %: 55 %
Platelet Count: 175 10*3/uL (ref 150–400)
RBC: 4.18 MIL/uL — ABNORMAL LOW (ref 4.22–5.81)
RDW: 14.2 % (ref 11.5–15.5)
WBC Count: 4.7 10*3/uL (ref 4.0–10.5)
nRBC: 0 % (ref 0.0–0.2)

## 2021-08-27 LAB — COMPREHENSIVE METABOLIC PANEL
ALT: 38 U/L (ref 0–44)
AST: 37 U/L (ref 15–41)
Albumin: 3.1 g/dL — ABNORMAL LOW (ref 3.5–5.0)
Alkaline Phosphatase: 112 U/L (ref 38–126)
Anion gap: 9 (ref 5–15)
BUN: 30 mg/dL — ABNORMAL HIGH (ref 8–23)
CO2: 25 mmol/L (ref 22–32)
Calcium: 8.4 mg/dL — ABNORMAL LOW (ref 8.9–10.3)
Chloride: 97 mmol/L — ABNORMAL LOW (ref 98–111)
Creatinine, Ser: 1.1 mg/dL (ref 0.61–1.24)
GFR, Estimated: >60 mL/min (ref >60)
Glucose, Bld: 145 mg/dL — ABNORMAL HIGH (ref 70–99)
Potassium: 3.3 mmol/L — ABNORMAL LOW (ref 3.5–5.1)
Sodium: 131 mmol/L — ABNORMAL LOW (ref 135–145)
Total Bilirubin: 2.2 mg/dL — ABNORMAL HIGH (ref 0.3–1.2)
Total Protein: 6.6 g/dL (ref 6.5–8.1)

## 2021-08-27 LAB — PROTIME-INR
INR: 1.3 — ABNORMAL HIGH (ref 0.8–1.2)
Prothrombin Time: 15.7 seconds — ABNORMAL HIGH (ref 11.4–15.2)

## 2021-08-27 LAB — APTT: aPTT: 32 seconds (ref 24–36)

## 2021-08-27 MED ORDER — LACTATED RINGERS IV SOLN: INTRAVENOUS | Status: DC

## 2021-08-27 MED ORDER — SODIUM CHLORIDE 0.9% FLUSH
10.0000 mL | Freq: Once | INTRAVENOUS | Status: AC
  Administered 2021-08-27: 10 mL

## 2021-08-27 MED ORDER — SODIUM CHLORIDE 0.9 % IV SOLN: Freq: Once | INTRAVENOUS | Status: AC

## 2021-08-27 MED ORDER — LACTATED RINGERS IV BOLUS (SEPSIS)
500.0000 mL | Freq: Once | INTRAVENOUS | Status: AC
  Administered 2021-08-27: 500 mL via INTRAVENOUS

## 2021-08-27 MED ORDER — SODIUM CHLORIDE 0.9 % IV SOLN
800.0000 mg/m2 | Freq: Once | INTRAVENOUS | Status: AC
  Administered 2021-08-27: 1482 mg via INTRAVENOUS
  Filled 2021-08-27: qty 38.98

## 2021-08-27 MED ORDER — PROCHLORPERAZINE MALEATE 10 MG PO TABS
10.0000 mg | ORAL_TABLET | Freq: Once | ORAL | Status: AC
  Administered 2021-08-27: 10 mg via ORAL

## 2021-08-27 MED ORDER — ACETAMINOPHEN 325 MG PO TABS
650.0000 mg | ORAL_TABLET | Freq: Once | ORAL | Status: AC
  Administered 2021-08-27: 650 mg via ORAL
  Filled 2021-08-27: qty 2

## 2021-08-27 NOTE — Progress Notes (Signed)
Ok to proceed with gemzar today with bilirubin of 2.2 per Dr. Burr Medico. We are holding abraxane at this time.

## 2021-08-27 NOTE — ED Triage Notes (Signed)
Pt biba from home after second chemo tx yesterday for pancreatic ca. Pt spiked a fever today of 100.8

## 2021-08-27 NOTE — ED Provider Notes (Signed)
[facility] DEPT Provider Note   CSN: [id number] Arrival date & time: 08/27/21  2138     History  Chief Complaint  Patient presents with   Fever    On chemo    Angel Keller. is a 81 y.o. male.   Fever Associated symptoms: no chest pain and no dysuria    Patient has a history of hypertension, thyroid disease, hyperlipidemia, thoracic ascending aortic aneurysm and pancreatic cancer currently undergoing active treatments.  Patient presented today after feeling very chilled.  He had an infusion today at the cancer center.  He was treated with Gemzar.  Patient states when he was home today he suddenly became very chilled and cold.  He could not warm up.  He started shaking.  Patient came to the ED for evaluation was noted to have a temperature 101.  He denies any trouble with coughing or sore throat.  He is not having any vomiting or diarrhea.  He has not noticed any rashes.  He does have indwelling catheters and lines and has not noticed any redness or drainage at the sites.  Home Medications Prior to Admission medications   Medication Sig Start Date End Date Taking? Authorizing Provider  aspirin EC 81 MG tablet Take 1 tablet (81 mg total) by mouth daily. Hold while on blood thinner/eliquis for 3 months, then resume Patient taking differently: Take 81 mg by mouth daily. 07/30/20   Rai, Vernelle Emerald, MD  bisoprolol-hydrochlorothiazide (ZIAC) 2.5-6.25 MG tablet Take 1 tablet by mouth every morning. 06/20/15   [provider]  Cholecalciferol (VITAMIN D3) 50 MCG (2000 UT) TABS Take 2,000 Units by mouth daily.    [provider]  escitalopram (LEXAPRO) 20 MG tablet Take 20 mg by mouth daily. 12/22/18   [provider]  famotidine (PEPCID) 20 MG tablet Take 1 tablet (20 mg total) by mouth 2 (two) times daily. 08/06/21   Mansouraty, Telford Nab., MD  lidocaine-prilocaine (EMLA) cream Apply to affected area once 08/14/21   Truitt Merle, MD   mirtazapine (REMERON) 7.5 MG tablet Take 1 tablet (7.5 mg total) by mouth at bedtime. 08/26/21   Ronnell Freshwater, NP  ondansetron (ZOFRAN) 8 MG tablet Take 1 tablet (8 mg total) by mouth 2 (two) times daily as needed (Nausea or vomiting). 08/14/21   Truitt Merle, MD  ondansetron (ZOFRAN-ODT) 4 MG disintegrating tablet Take 1 tablet (4 mg total) by mouth every 8 (eight) hours as needed for nausea or vomiting. 07/27/21   Ronnell Freshwater, NP  oxyCODONE (OXY IR/ROXICODONE) 5 MG immediate release tablet Take 1 tablet (5 mg total) by mouth every 6 (six) hours as needed for moderate pain. 07/23/21   Ghimire, Henreitta Leber, MD  pantoprazole (PROTONIX) 40 MG tablet Take 1 tablet (40 mg total) by mouth daily. 07/12/21   Ronnell Freshwater, NP  prochlorperazine (COMPAZINE) 10 MG tablet Take 1 tablet (10 mg total) by mouth every 6 (six) hours as needed (Nausea or vomiting). 08/14/21   Truitt Merle, MD  Sodium Chloride Flush (NORMAL SALINE FLUSH) 0.9 % SOLN Flush tube once daily with 1 syringe. 08/12/21 09/11/21  Truitt Merle, MD  sucralfate (CARAFATE) 1 GM/10ML suspension Take 10 mLs (1 g total) by mouth 2 (two) times daily. Patient taking differently: Take 1 g by mouth daily. 08/05/21 08/05/22  Mansouraty, Telford Nab., MD  SYNTHROID 112 MCG tablet Take 1 tablet (112 mcg total) by mouth daily before breakfast. 08/26/21   Ronnell Freshwater, NP  vitamin B-12 (CYANOCOBALAMIN) 1000 MCG tablet Take 1,000 mcg by mouth 2 (two) times daily.    [provider]      Allergies    Patient has no known allergies.    Review of Systems   Review of Systems  Constitutional:  Positive for fever.  Respiratory:  Negative for shortness of breath.   Cardiovascular:  Negative for chest pain.  Gastrointestinal:  Negative for abdominal pain.  Genitourinary:  Negative for dysuria.   Physical Exam Updated Vital Signs BP 131/66    Pulse 87    Temp (!) 101 F (38.3 C) (Oral)    Resp (!) 23    SpO2 98%  Physical Exam Vitals and  nursing note reviewed.  Constitutional:      Appearance: He is well-developed. He is not diaphoretic.  HENT:     Head: Normocephalic and atraumatic.     Right Ear: External ear normal.     Left Ear: External ear normal.  Eyes:     General: No scleral icterus.       Right eye: No discharge.        Left eye: No discharge.     Conjunctiva/sclera: Conjunctivae normal.  Neck:     Trachea: No tracheal deviation.  Cardiovascular:     Rate and Rhythm: Normal rate and regular rhythm.     Comments: Port-A-Cath right chest wall, no erythema noted Pulmonary:     Effort: Pulmonary effort is normal. No respiratory distress.     Breath sounds: Normal breath sounds. No stridor. No wheezing or rales.  Abdominal:     General: Bowel sounds are normal. There is no distension.     Palpations: Abdomen is soft.     Tenderness: There is no abdominal tenderness. There is no guarding or rebound.     Comments: Abdominal biliary drain right upper quadrant without erythema or discharge  Musculoskeletal:        General: No tenderness or deformity.     Cervical back: Neck supple.  Skin:    General: Skin is warm and dry.     Findings: No rash.  Neurological:     General: No focal deficit present.     Mental Status: He is alert.     Cranial Nerves: No cranial nerve deficit (no facial droop, extraocular movements intact, no slurred speech).     Sensory: No sensory deficit.     Motor: No abnormal muscle tone or seizure activity.     Coordination: Coordination normal.  Psychiatric:        Mood and Affect: Mood normal.    ED Results / Procedures / Treatments   Labs (all labs ordered are listed, but only abnormal results are displayed) Labs Reviewed  COMPREHENSIVE METABOLIC PANEL - Abnormal; Notable for the following components:      Result Value   Sodium 131 (*)    Potassium 3.3 (*)    Chloride 97 (*)    Glucose, Bld 145 (*)    BUN 30 (*)    Calcium 8.4 (*)    Albumin 3.1 (*)    Total Bilirubin 2.2  (*)    All other components within normal limits  CBC WITH DIFFERENTIAL/PLATELET - Abnormal; Notable for the following components:   RBC 4.17 (*)    Hemoglobin 11.3 (*)    HCT 33.5 (*)    Platelets 141 (*)    All other components within normal limits  PROTIME-INR - Abnormal; Notable for the following components:  Prothrombin Time 15.7 (*)    INR 1.3 (*)    All other components within normal limits  CULTURE, BLOOD (ROUTINE X 2)  CULTURE, BLOOD (ROUTINE X 2)  URINE CULTURE  RESP PANEL BY RT-PCR (FLU A&B, COVID) ARPGX2  LACTIC ACID, PLASMA  APTT  URINALYSIS, ROUTINE W REFLEX MICROSCOPIC    EKG EKG Interpretation  Date/Time:  Friday August 27 2021 21:48:53 EST Ventricular Rate:  91 PR Interval:  206 QRS Duration: 93 QT Interval:  416 QTC Calculation: 512 R Axis:   17 Text Interpretation: Sinus rhythm Borderline repolarization abnormality Prolonged QT interval , new since last tracing Since last tracing rate faster Confirmed by Dorie Rank [phone]) on 08/27/2021 10:18:40 PM  Radiology DG Chest Port 1 View  Result Date: 08/27/2021 CLINICAL DATA:  Questionable sepsis - evaluate for abnormality EXAM: PORTABLE CHEST 1 VIEW.  Patient is rotated COMPARISON:  Chest x-ray 08/19/2021, CT chest 08/12/2021 FINDINGS: Right chest wall Port-A-Cath with tip overlying the distal superior vena cava. The heart and mediastinal contours are unchanged. Atherosclerotic plaque No focal consolidation. No pulmonary edema. No pleural effusion. No pneumothorax. No acute osseous abnormality. IMPRESSION: 1. No active disease. 2.  Aortic Atherosclerosis (ICD10-I70.0). Electronically Signed   By: Iven Finn M.D.   On: 08/27/2021 23:07    Procedures Procedures    Medications Ordered in ED Medications  lactated ringers infusion ( Intravenous New Bag/Given 08/27/21 2245)  lactated ringers bolus 500 mL (500 mLs Intravenous New Bag/Given 08/27/21 2239)  acetaminophen (TYLENOL) tablet 650 mg (650 mg Oral Given  08/27/21 2244)    ED Course/ Medical Decision Making/ A&P Clinical Course as of 08/27/21 2355  Fri Aug 27, 2021  2330 CBC without leukocytosis, no neutropenia [JK]  6045 Metabolic panel shows hyponatremia, not significantly [JK]  2331 Chest x-ray without acute findings [JK]  2334 Initial lactic acid level normal. [JK]    Clinical Course User Index [JK] Dorie Rank, MD                           Medical Decision Making  Pt with recent chemo treatment, fever this evening.  NO focal sx to suggest source of infection.  At risk for severe infection with his recent chemo.  No neutropenia or leukocytosis.  No pna.  Vitals stable, lactic acid normal.  No signs of evolving sepsis. COvid and UA pending.  Pt overall feels well, possible the fever is from the chemo.  Care turned over to Dr Tyrone Nine pending ua and covid.        Final Clinical Impression(s) / ED Diagnoses Final diagnoses:  Fever, unspecified fever cause    Rx / DC Orders ED Discharge Orders     None         Dorie Rank, MD 08/27/21 2355

## 2021-08-27 NOTE — Progress Notes (Signed)
[facility]   [phone] [phone]   Clinic Follow up Note   Patient Care Team: Ronnell Freshwater, NP as PCP - General (Family Medicine) Truitt Merle, MD as Consulting Physician (Oncology) Royston Bake, RN as Oncology Nurse Navigator (Oncology)  Date of Service:  08/27/2021  CHIEF COMPLAINT: f/u of pancreatic cancer  CURRENT THERAPY:  Neoadjuvant chemotherapy gemcitabine and Abraxane on day 1, 8 every 21 days, started with gemcitabine alone on 08/20/21 for first cycle due to hyperbilirubinemia.  ASSESSMENT & PLAN:  Angel Kossman. is a 81 y.o. male with   1. 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 -Pancreatic protocol CT scan showed 3.6 x 2.6 cm pancreatic head mass, partially involving multiple vessels, including SMV, portal vein, and hepatic artery.  This is borderline resectable. -Patient was evaluated by Dr. Zenia Resides, who recommends neoadjuvant chemotherapy, which I agree. -he began gemcitabine alone on 08/20/21, and he will receive gemcitabine for C1D8 today. He tolerated chemo moderate well, PS remains to be low  -plan to add Abraxane next cycle if her LFT improves   2. Symptom Management: loss of appetite, weight loss, constipation, taste/smell change -he reports loss of appetite causing weight loss. I will refer him to our dietician. I encouraged him to take the mirtazapine. -he reports he has had two bowel movements in the last week. He states he has started miralax with no relief. I advised them to get magnesium citrate. -he reports a terrible lingering smell and a bad taste since starting chemo.   3. Epigastric pain and obstructive jaundice -Status post PTC, currently not draining. We will arrange f/u with IR. -T bili improving, 2.2  on 08/20/21 -he reports his urine color is back to normal, but his sclera is still yellowed today     PLAN -proceed with C1D8 gemcitabine today at reduced dose 835m/m2 -will reach out to IR for f/u of PTC -reschedule genetic counseling on 08/31/21 per pt's request  -lab, flush, f/u, and C2 gem/abraxane in 2 weeks   No problem-specific Assessment & Plan notes found for this encounter.   SUMMARY OF ONCOLOGIC HISTORY: Oncology History Overview Note   Cancer Staging  Pancreatic cancer ([facility] Staging form: Exocrine Pancreas, AJCC 8th Edition - Clinical stage from 08/05/2021: Stage IB (cT2, cN0, cM0) - Signed by FTruitt Merle MD on 08/17/2021 Stage prefix: Initial diagnosis Total positive nodes: 0     Pancreatic mass  07/18/2021 Initial Diagnosis   Pancreatic mass   07/18/2021 Imaging   CT Abdomen Pelvis W Contrast  IMPRESSION: 1. Suspected solid mass within the proximal body of the pancreas measures 2.3 x 2.6 x 2.0 cm. There is downstream dilation of the main pancreatic duct and its branches. There is also intra and extrahepatic biliary ductal dilation. Further evaluation with MRI of the abdomen, pancreatic protocol, when clinically feasible, may be considered. 2. 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 3. Aortic atherosclerosis   07/20/2021 Procedure   DG ERCP  IMPRESSION: Nondiagnostic ERCP as above. Correlation with the operative report is advised.  - The examination was suspicious for a biliary stricture in the bile duct. - Examination was suspicious for carcinoma of the head of the pancreas. - Attempts at a cholangiogram failed.   07/22/2021 Pathology Results   CASE: [id number]   FINAL MICROSCOPIC DIAGNOSIS:  -  Suspicious for malignancy  - See comment   DIAGNOSTIC COMMENTS:  There are rare cells with cytologic atypia suspicious for  adenocarcinoma.  Dr. Vic Ripper agrees.    07/22/2021 Procedure   IR INT EXT BILIARY DRAIN WITH CHOLANGIOGRAM  ( IMPRESSION: 1. 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 2. Successful brush biopsy of obstructing lesion x3. 3. Successful placement of a 10 French internal/external biliary drainage catheter.   PLAN: 1. Follow bilirubin. When significant downward trend is clear, the bag should be capped. Recommend capping bag before discharge home if possible. 2. Follow-up in IR in 4-6 weeks for initial biliary tube check and exchange. If initial brush biopsies are negative, repeat biopsy could be considered at that time.   Pancreatic cancer ([place])  08/05/2021 Cancer Staging   Staging form: Exocrine Pancreas, AJCC 8th Edition - Clinical stage from 08/05/2021: Stage IB (cT2, cN0, cM0) - Signed by Truitt Merle, MD on 08/17/2021 Stage prefix: Initial diagnosis Total positive nodes: 0    08/14/2021 Initial Diagnosis   Pancreatic cancer ([place])   08/20/2021 -  Chemotherapy   Patient is on Treatment Plan : PANCREATIC Abraxane / Gemcitabine D1,8,15 q28d        INTERVAL HISTORY:  Angel Celeste Sr. is here for a follow up of pancreatic cancer. He was last seen by me on 08/17/21. He presents to the clinic accompanied by his daughter. She reports he has lost weight due to a loss of appetite. They report he has broken his denture and that he needs to have some teeth pulled.    All other systems were reviewed with the patient and are negative.  MEDICAL HISTORY:  Past Medical History:  Diagnosis Date   Aneurysm ([place])    2017   Anxiety    Arthritis    Depression    Headache    HLD (hyperlipidemia)    Hx of inguinal hernia repair    Hypertension    Hypothyroidism    Overactive bladder    Pancreatic cancer ([facility])    Pneumonia    Shingles    Sleep apnea    Patient denies   Thoracic ascending aortic aneurysm    mildly dilated 4.0cm per 08/12/21 CT   Thyroid disease    Varicose veins of both lower extremities     SURGICAL HISTORY: Past  Surgical History:  Procedure Laterality Date   BILATERAL CARPAL TUNNEL RELEASE     CATARACT EXTRACTION, BILATERAL Bilateral    ELBOW SURGERY Bilateral    ENDOSCOPIC RETROGRADE CHOLANGIOPANCREATOGRAPHY (ERCP) WITH PROPOFOL N/A 07/20/2021   Procedure: ENDOSCOPIC RETROGRADE CHOLANGIOPANCREATOGRAPHY (ERCP) WITH PROPOFOL;  Surgeon: Gatha Mayer, MD;  Location: [facility] ENDOSCOPY;  Service: Endoscopy;  Laterality: N/A;   ESOPHAGOGASTRODUODENOSCOPY (EGD) WITH PROPOFOL N/A 08/05/2021   Procedure: ESOPHAGOGASTRODUODENOSCOPY (EGD) WITH PROPOFOL;  Surgeon: Rush Landmark Telford Nab., MD;  Location: [facility];  Service: Gastroenterology;  Laterality: N/A;   EUS N/A 08/05/2021   Procedure: UPPER ENDOSCOPIC ULTRASOUND (EUS) RADIAL;  Surgeon: Irving Copas., MD;  Location: [facility];  Service: Gastroenterology;  Laterality: N/A;   FINE NEEDLE ASPIRATION  08/05/2021   Procedure: FINE NEEDLE ASPIRATION (FNA) LINEAR;  Surgeon: Irving Copas., MD;  Location: [facility];  Service: Gastroenterology;;   INGUINAL HERNIA REPAIR Right 1951   IR ANGIO INTRA EXTRACRAN SEL COM CAROTID INNOMINATE BILAT MOD SED  02/20/2019   IR ENDOLUMINAL BX OF BILIARY TREE  07/22/2021   IR GENERIC HISTORICAL  10/28/2016   IR RADIOLOGIST EVAL &  MGMT 10/28/2016 [facility]INTER[facility]   IR INT EXT BILIARY DRAIN WITH CHOLANGIOGRAM  07/22/2021   PORTACATH PLACEMENT N/A 08/19/2021   Procedure: INSERTION PORT-A-CATH;  Surgeon: Dwan Bolt, MD;  Location: [facility];  Service: General;  Laterality: N/A;  LMA   RADIOLOGY WITH ANESTHESIA N/A 10/21/2015   Procedure: RADIOLOGY WITH ANESTHESIA;  Surgeon: Luanne Bras, MD;  Location: [facility];  Service: Radiology;  Laterality: N/A;   RADIOLOGY WITH ANESTHESIA N/A 02/20/2019   Procedure: [employer];  Surgeon: Luanne Bras, MD;  Location: [facility];  Service: Radiology;  Laterality: N/A;   ROTATOR CUFF REPAIR Bilateral    VARICOSE VEIN SURGERY      I have reviewed the social history and family  history with the patient and they are unchanged from previous note.  ALLERGIES:  has No Known Allergies.  MEDICATIONS:  Current Outpatient Medications  Medication Sig Dispense Refill   aspirin EC 81 MG tablet Take 1 tablet (81 mg total) by mouth daily. Hold while on blood thinner/eliquis for 3 months, then resume (Patient taking differently: Take 81 mg by mouth daily.) 30 tablet 11   bisoprolol-hydrochlorothiazide (ZIAC) 2.5-6.25 MG tablet Take 1 tablet by mouth every morning.     Cholecalciferol (VITAMIN D3) 50 MCG (2000 UT) TABS Take 2,000 Units by mouth daily.     escitalopram (LEXAPRO) 20 MG tablet Take 20 mg by mouth daily.     famotidine (PEPCID) 20 MG tablet Take 1 tablet (20 mg total) by mouth 2 (two) times daily. 60 tablet 1   lidocaine-prilocaine (EMLA) cream Apply to affected area once 30 g 3   mirtazapine (REMERON) 7.5 MG tablet Take 1 tablet (7.5 mg total) by mouth at bedtime. 90 tablet 0   ondansetron (ZOFRAN) 8 MG tablet Take 1 tablet (8 mg total) by mouth 2 (two) times daily as needed (Nausea or vomiting). 30 tablet 1   ondansetron (ZOFRAN-ODT) 4 MG disintegrating tablet Take 1 tablet (4 mg total) by mouth every 8 (eight) hours as needed for nausea or vomiting. 30 tablet 1   oxyCODONE (OXY IR/ROXICODONE) 5 MG immediate release tablet Take 1 tablet (5 mg total) by mouth every 6 (six) hours as needed for moderate pain. 10 tablet 0   pantoprazole (PROTONIX) 40 MG tablet Take 1 tablet (40 mg total) by mouth daily. 30 tablet 2   prochlorperazine (COMPAZINE) 10 MG tablet Take 1 tablet (10 mg total) by mouth every 6 (six) hours as needed (Nausea or vomiting). 30 tablet 1   Sodium Chloride Flush (NORMAL SALINE FLUSH) 0.9 % SOLN Flush tube once daily with 1 syringe. 300 mL 0   sucralfate (CARAFATE) 1 GM/10ML suspension Take 10 mLs (1 g total) by mouth 2 (two) times daily. (Patient taking differently: Take 1 g by mouth daily.) 420 mL 2   SYNTHROID 112 MCG tablet Take 1 tablet (112 mcg  total) by mouth daily before breakfast. 90 tablet 1   vitamin B-12 (CYANOCOBALAMIN) 1000 MCG tablet Take 1,000 mcg by mouth 2 (two) times daily.     No current facility-administered medications for this visit.    PHYSICAL EXAMINATION: ECOG PERFORMANCE STATUS: 2 - Symptomatic, <50% confined to bed  Vitals:   08/27/21 1107  BP: (!) 141/73  Pulse: 60  Resp: 17  Temp: 98.6 F (37 C)  SpO2: 97%   Wt Readings from Last 3 Encounters:  08/27/21 146 lb 9 oz (66.5 kg)  08/26/21 149 lb 6.4 oz (67.8 kg)  08/19/21 153 lb (69.4 kg)  GENERAL:alert, no distress and comfortable SKIN: skin color normal, no rashes or significant lesions EYES: normal, Conjunctiva are pink and non-injected, sclera clear  NEURO: alert & oriented x 3 with fluent speech  LABORATORY DATA:  I have reviewed the data as listed CBC Latest Ref Rng & Units 08/20/2021 08/12/2021 07/27/2021  WBC 4.0 - 10.5 K/uL 7.5 7.2 8.5  Hemoglobin 13.0 - 17.0 g/dL 11.6(L) 12.3(L) 13.2  Hematocrit 39.0 - 52.0 % 34.6(L) 37.2(L) 39.8  Platelets 150 - 400 K/uL 232 217 356     CMP Latest Ref Rng & Units 08/20/2021 08/12/2021 07/27/2021  Glucose 70 - 99 mg/dL 103(H) 125(H) 133(H)  BUN 8 - 23 mg/dL 33(H) 25(H) 21  Creatinine 0.61 - 1.24 mg/dL 1.28(H) 1.39(H) 1.18  Sodium 135 - 145 mmol/L 135 135 135  Potassium 3.5 - 5.1 mmol/L 4.1 4.8 4.5  Chloride 98 - 111 mmol/L 96(L) 99 92(L)  CO2 22 - 32 mmol/L '30 28 26  ' Calcium 8.9 - 10.3 mg/dL 9.4 9.4 9.5  Total Protein 6.5 - 8.1 g/dL 7.1 7.0 7.0  Total Bilirubin 0.3 - 1.2 mg/dL 2.2(H) 3.2(H) 7.8(H)  Alkaline Phos 38 - 126 U/L 126 176(H) 460(H)  AST 15 - 41 U/L 48(H) 35 92(H)  ALT 0 - 44 U/L 35 36 99(H)      RADIOGRAPHIC STUDIES: I have personally reviewed the radiological images as listed and agreed with the findings in the report. No results found.    No orders of the defined types were placed in this encounter.  All questions were answered. The patient knows to call the clinic  with any problems, questions or concerns. No barriers to learning was detected. The total time spent in the appointment was 30 minutes.     Truitt Merle, MD 08/27/2021   I, Wilburn Mylar, am acting as scribe for Truitt Merle, MD.   I have reviewed the above documentation for accuracy and completeness, and I agree with the above.

## 2021-08-27 NOTE — Patient Instructions (Signed)
[facility]  Discharge Instructions: Thank you for choosing [facility] to provide your oncology and hematology care.   If you have a lab appointment with the [facility], please go directly to the [facility] and check in at the registration area.   Wear comfortable clothing and clothing appropriate for easy access to any Portacath or PICC line.   We strive to give you quality time with your provider. You may need to reschedule your appointment if you arrive late (15 or more minutes).  Arriving late affects you and other patients whose appointments are after yours.  Also, if you miss three or more appointments without notifying the office, you may be dismissed from the clinic at the providers discretion.      For prescription refill requests, have your pharmacy contact our office and allow 72 hours for refills to be completed.    Today you received the following chemotherapy and/or immunotherapy agents: Gemcitabine.       To help prevent nausea and vomiting after your treatment, we encourage you to take your nausea medication as directed.  BELOW ARE SYMPTOMS THAT SHOULD BE REPORTED IMMEDIATELY: *FEVER GREATER THAN 100.4 F (38 C) OR HIGHER *CHILLS OR SWEATING *NAUSEA AND VOMITING THAT IS NOT CONTROLLED WITH YOUR NAUSEA MEDICATION *UNUSUAL SHORTNESS OF BREATH *UNUSUAL BRUISING OR BLEEDING *URINARY PROBLEMS (pain or burning when urinating, or frequent urination) *BOWEL PROBLEMS (unusual diarrhea, constipation, pain near the anus) TENDERNESS IN MOUTH AND THROAT WITH OR WITHOUT PRESENCE OF ULCERS (sore throat, sores in mouth, or a toothache) UNUSUAL RASH, SWELLING OR PAIN  UNUSUAL VAGINAL DISCHARGE OR ITCHING   Items with * indicate a potential emergency and should be followed up as soon as possible or go to the Emergency Department if any problems should occur.  Please show the CHEMOTHERAPY ALERT CARD or IMMUNOTHERAPY ALERT CARD at check-in  to the Emergency Department and triage nurse.  Should you have questions after your visit or need to cancel or reschedule your appointment, please contact [facility]  Dept: [phone]  and follow the prompts.  Office hours are 8:00 a.m. to 4:30 p.m. Monday - Friday. Please note that voicemails left after 4:00 p.m. may not be returned until the following business day.  We are closed weekends and major holidays. You have access to a nurse at all times for urgent questions. Please call the main number to the clinic Dept: [phone] and follow the prompts.   For any non-urgent questions, you may also contact your provider using MyChart. We now offer e-Visits for anyone 29 and older to request care online for non-urgent symptoms. For details visit [email or web address].   Also download the MyChart app! Go to the app store, search "MyChart", open the app, select [facility], and log in with your MyChart username and password.  Due to Covid, a mask is required upon entering the hospital/clinic. If you do not have a mask, one will be given to you upon arrival. For doctor visits, patients may have 1 support person aged 41 or older with them. For treatment visits, patients cannot have anyone with them due to current Covid guidelines and our immunocompromised population.   Gemcitabine injection What is this medication? GEMCITABINE (jem SYE ta been) is a chemotherapy drug. This medicine is used to treat many types of cancer like breast cancer, lung cancer, pancreatic cancer, and ovarian cancer. This medicine may be used for other purposes; ask your health care provider  or pharmacist if you have questions. COMMON BRAND NAME(S): Gemzar, Infugem What should I tell my care team before I take this medication? They need to know if you have any of these conditions: blood disorders infection kidney disease liver disease lung or breathing disease, like asthma recent or ongoing  radiation therapy an unusual or allergic reaction to gemcitabine, other chemotherapy, other medicines, foods, dyes, or preservatives pregnant or trying to get pregnant breast-feeding How should I use this medication? This drug is given as an infusion into a vein. It is administered in a hospital or clinic by a specially trained health care professional. Talk to your pediatrician regarding the use of this medicine in children. Special care may be needed. Overdosage: If you think you have taken too much of this medicine contact a poison control center or emergency room at once. NOTE: This medicine is only for you. Do not share this medicine with others. What if I miss a dose? It is important not to miss your dose. Call your doctor or health care professional if you are unable to keep an appointment. What may interact with this medication? medicines to increase blood counts like filgrastim, pegfilgrastim, sargramostim some other chemotherapy drugs like cisplatin vaccines Talk to your doctor or health care professional before taking any of these medicines: acetaminophen aspirin ibuprofen ketoprofen naproxen This list may not describe all possible interactions. Give your health care provider a list of all the medicines, herbs, non-prescription drugs, or dietary supplements you use. Also tell them if you smoke, drink alcohol, or use illegal drugs. Some items may interact with your medicine. What should I watch for while using this medication? Visit your doctor for checks on your progress. This drug may make you feel generally unwell. This is not uncommon, as chemotherapy can affect healthy cells as well as cancer cells. Report any side effects. Continue your course of treatment even though you feel ill unless your doctor tells you to stop. In some cases, you may be given additional medicines to help with side effects. Follow all directions for their use. Call your doctor or health care  professional for advice if you get a fever, chills or sore throat, or other symptoms of a cold or flu. Do not treat yourself. This drug decreases your body's ability to fight infections. Try to avoid being around people who are sick. This medicine may increase your risk to bruise or bleed. Call your doctor or health care professional if you notice any unusual bleeding. Be careful brushing and flossing your teeth or using a toothpick because you may get an infection or bleed more easily. If you have any dental work done, tell your dentist you are receiving this medicine. Avoid taking products that contain aspirin, acetaminophen, ibuprofen, naproxen, or ketoprofen unless instructed by your doctor. These medicines may hide a fever. Do not become pregnant while taking this medicine or for 6 months after stopping it. Women should inform their doctor if they wish to become pregnant or think they might be pregnant. Men should not father a child while taking this medicine and for 3 months after stopping it. There is a potential for serious side effects to an unborn child. Talk to your health care professional or pharmacist for more information. Do not breast-feed an infant while taking this medicine or for at least 1 week after stopping it. Men should inform their doctors if they wish to father a child. This medicine may lower sperm counts. Talk with your doctor or health  care professional if you are concerned about your fertility. What side effects may I notice from receiving this medication? Side effects that you should report to your doctor or health care professional as soon as possible: allergic reactions like skin rash, itching or hives, swelling of the face, lips, or tongue breathing problems pain, redness, or irritation at site where injected signs and symptoms of a dangerous change in heartbeat or heart rhythm like chest pain; dizziness; fast or irregular heartbeat; palpitations; feeling faint or  lightheaded, falls; breathing problems signs of decreased platelets or bleeding - bruising, pinpoint red spots on the skin, black, tarry stools, blood in the urine signs of decreased red blood cells - unusually weak or tired, feeling faint or lightheaded, falls signs of infection - fever or chills, cough, sore throat, pain or difficulty passing urine signs and symptoms of kidney injury like trouble passing urine or change in the amount of urine signs and symptoms of liver injury like dark yellow or brown urine; general ill feeling or flu-like symptoms; light-colored stools; loss of appetite; nausea; right upper belly pain; unusually weak or tired; yellowing of the eyes or skin swelling of ankles, feet, hands Side effects that usually do not require medical attention (report to your doctor or health care professional if they continue or are bothersome): constipation diarrhea hair loss loss of appetite nausea rash vomiting This list may not describe all possible side effects. Call your doctor for medical advice about side effects. You may report side effects to FDA at 1-800-FDA-1088. Where should I keep my medication? This drug is given in a hospital or clinic and will not be stored at home. NOTE: This sheet is a summary. It may not cover all possible information. If you have questions about this medicine, talk to your doctor, pharmacist, or health care provider.  2022 [employer]vier/Gold Standard (2017-10-25 00:00:00)

## 2021-08-28 ENCOUNTER — Encounter (HOSPITAL_COMMUNITY): Payer: Self-pay | Admitting: Family Medicine

## 2021-08-28 ENCOUNTER — Encounter: Payer: Self-pay | Admitting: Hematology

## 2021-08-28 ENCOUNTER — Inpatient Hospital Stay (HOSPITAL_COMMUNITY): Payer: Medicare HMO

## 2021-08-28 DIAGNOSIS — E876 Hypokalemia: Secondary | ICD-10-CM

## 2021-08-28 DIAGNOSIS — I959 Hypotension, unspecified: Secondary | ICD-10-CM | POA: Diagnosis present

## 2021-08-28 DIAGNOSIS — F32A Depression, unspecified: Secondary | ICD-10-CM | POA: Diagnosis present

## 2021-08-28 DIAGNOSIS — E871 Hypo-osmolality and hyponatremia: Secondary | ICD-10-CM | POA: Diagnosis present

## 2021-08-28 DIAGNOSIS — I4891 Unspecified atrial fibrillation: Secondary | ICD-10-CM

## 2021-08-28 DIAGNOSIS — E039 Hypothyroidism, unspecified: Secondary | ICD-10-CM | POA: Diagnosis present

## 2021-08-28 DIAGNOSIS — K831 Obstruction of bile duct: Secondary | ICD-10-CM | POA: Diagnosis present

## 2021-08-28 DIAGNOSIS — Z20822 Contact with and (suspected) exposure to covid-19: Secondary | ICD-10-CM | POA: Diagnosis present

## 2021-08-28 DIAGNOSIS — Z811 Family history of alcohol abuse and dependence: Secondary | ICD-10-CM | POA: Diagnosis not present

## 2021-08-28 DIAGNOSIS — R509 Fever, unspecified: Secondary | ICD-10-CM | POA: Diagnosis present

## 2021-08-28 DIAGNOSIS — Z803 Family history of malignant neoplasm of breast: Secondary | ICD-10-CM | POA: Diagnosis not present

## 2021-08-28 DIAGNOSIS — I480 Paroxysmal atrial fibrillation: Secondary | ICD-10-CM | POA: Diagnosis present

## 2021-08-28 DIAGNOSIS — R9431 Abnormal electrocardiogram [ECG] [EKG]: Secondary | ICD-10-CM

## 2021-08-28 DIAGNOSIS — Z7901 Long term (current) use of anticoagulants: Secondary | ICD-10-CM | POA: Diagnosis not present

## 2021-08-28 DIAGNOSIS — I10 Essential (primary) hypertension: Secondary | ICD-10-CM | POA: Diagnosis present

## 2021-08-28 DIAGNOSIS — A419 Sepsis, unspecified organism: Secondary | ICD-10-CM | POA: Diagnosis present

## 2021-08-28 DIAGNOSIS — E860 Dehydration: Secondary | ICD-10-CM | POA: Diagnosis present

## 2021-08-28 DIAGNOSIS — R6510 Systemic inflammatory response syndrome (SIRS) of non-infectious origin without acute organ dysfunction: Secondary | ICD-10-CM | POA: Diagnosis present

## 2021-08-28 DIAGNOSIS — E43 Unspecified severe protein-calorie malnutrition: Secondary | ICD-10-CM | POA: Diagnosis present

## 2021-08-28 DIAGNOSIS — Z7982 Long term (current) use of aspirin: Secondary | ICD-10-CM | POA: Diagnosis not present

## 2021-08-28 DIAGNOSIS — Z9221 Personal history of antineoplastic chemotherapy: Secondary | ICD-10-CM | POA: Diagnosis not present

## 2021-08-28 DIAGNOSIS — Z801 Family history of malignant neoplasm of trachea, bronchus and lung: Secondary | ICD-10-CM | POA: Diagnosis not present

## 2021-08-28 DIAGNOSIS — N3281 Overactive bladder: Secondary | ICD-10-CM | POA: Diagnosis present

## 2021-08-28 DIAGNOSIS — C251 Malignant neoplasm of body of pancreas: Secondary | ICD-10-CM | POA: Diagnosis present

## 2021-08-28 DIAGNOSIS — E785 Hyperlipidemia, unspecified: Secondary | ICD-10-CM | POA: Diagnosis present

## 2021-08-28 DIAGNOSIS — Z6372 Alcoholism and drug addiction in family: Secondary | ICD-10-CM | POA: Diagnosis not present

## 2021-08-28 DIAGNOSIS — Z8507 Personal history of malignant neoplasm of pancreas: Secondary | ICD-10-CM | POA: Diagnosis not present

## 2021-08-28 DIAGNOSIS — Z87891 Personal history of nicotine dependence: Secondary | ICD-10-CM | POA: Diagnosis not present

## 2021-08-28 LAB — BLOOD CULTURE ID PANEL (REFLEXED) - BCID2

## 2021-08-28 LAB — RESP PANEL BY RT-PCR (FLU A&B, COVID) ARPGX2
Influenza A by PCR: NEGATIVE
Influenza B by PCR: NEGATIVE
SARS Coronavirus 2 by RT PCR: NEGATIVE

## 2021-08-28 LAB — URINALYSIS, ROUTINE W REFLEX MICROSCOPIC
Bacteria, UA: NONE SEEN
Bilirubin Urine: NEGATIVE
Glucose, UA: NEGATIVE mg/dL
Ketones, ur: NEGATIVE mg/dL
Leukocytes,Ua: NEGATIVE
Nitrite: NEGATIVE
Protein, ur: NEGATIVE mg/dL
Specific Gravity, Urine: 1.015 (ref 1.005–1.030)
pH: 5 (ref 5.0–8.0)

## 2021-08-28 LAB — ECHOCARDIOGRAM COMPLETE
AR max vel: 2.97 cm2
AV Area VTI: 2.71 cm2
AV Area mean vel: 2.77 cm2
AV Mean grad: 4 mmHg
AV Peak grad: 8.4 mmHg
Ao pk vel: 1.45 m/s
Area-P 1/2: 2.87 cm2
Height: 69 in
S' Lateral: 3.4 cm
Weight: 2384 oz

## 2021-08-28 LAB — BASIC METABOLIC PANEL
Anion gap: 5 (ref 5–15)
BUN: 23 mg/dL (ref 8–23)
CO2: 25 mmol/L (ref 22–32)
Calcium: 7.5 mg/dL — ABNORMAL LOW (ref 8.9–10.3)
Chloride: 106 mmol/L (ref 98–111)
Creatinine, Ser: 0.94 mg/dL (ref 0.61–1.24)
GFR, Estimated: >60 mL/min (ref >60)
Glucose, Bld: 125 mg/dL — ABNORMAL HIGH (ref 70–99)
Potassium: 3.5 mmol/L (ref 3.5–5.1)
Sodium: 136 mmol/L (ref 135–145)

## 2021-08-28 LAB — CBC
HCT: 28.7 % — ABNORMAL LOW (ref 39.0–52.0)
Hemoglobin: 9.6 g/dL — ABNORMAL LOW (ref 13.0–17.0)
MCH: 27.4 pg (ref 26.0–34.0)
MCHC: 33.4 g/dL (ref 30.0–36.0)
MCV: 82 fL (ref 80.0–100.0)
Platelets: 109 10*3/uL — ABNORMAL LOW (ref 150–400)
RBC: 3.5 MIL/uL — ABNORMAL LOW (ref 4.22–5.81)
RDW: 14.3 % (ref 11.5–15.5)
WBC: 4.8 10*3/uL (ref 4.0–10.5)
nRBC: 0 % (ref 0.0–0.2)

## 2021-08-28 LAB — MAGNESIUM: Magnesium: 2.1 mg/dL (ref 1.7–2.4)

## 2021-08-28 LAB — MRSA NEXT GEN BY PCR, NASAL: MRSA by PCR Next Gen: NOT DETECTED

## 2021-08-28 LAB — PROCALCITONIN: Procalcitonin: 0.78 ng/mL

## 2021-08-28 MED ORDER — LACTATED RINGERS IV BOLUS (SEPSIS): 2000.0000 mL | Freq: Once | INTRAVENOUS | Status: DC

## 2021-08-28 MED ORDER — LACTATED RINGERS IV SOLN: INTRAVENOUS | Status: AC

## 2021-08-28 MED ORDER — ACETAMINOPHEN 650 MG RE SUPP: 650.0000 mg | Freq: Four times a day (QID) | RECTAL | Status: DC | PRN

## 2021-08-28 MED ORDER — APIXABAN 5 MG PO TABS
5.0000 mg | ORAL_TABLET | Freq: Two times a day (BID) | ORAL | Status: DC
  Administered 2021-08-28 – 2021-08-31 (×7): 5 mg via ORAL
  Filled 2021-08-28 (×7): qty 1

## 2021-08-28 MED ORDER — SODIUM CHLORIDE 0.9 % IV SOLN: 2.0000 g | Freq: Two times a day (BID) | INTRAVENOUS | Status: DC

## 2021-08-28 MED ORDER — LACTATED RINGERS IV BOLUS
1000.0000 mL | Freq: Once | INTRAVENOUS | Status: AC
  Administered 2021-08-28: 1000 mL via INTRAVENOUS

## 2021-08-28 MED ORDER — HYDROXYZINE HCL 25 MG PO TABS
25.0000 mg | ORAL_TABLET | Freq: Once | ORAL | Status: AC
  Administered 2021-08-28: 25 mg via ORAL
  Filled 2021-08-28: qty 1

## 2021-08-28 MED ORDER — SODIUM CHLORIDE 0.9 % IV SOLN
2.0000 g | Freq: Once | INTRAVENOUS | Status: AC
  Administered 2021-08-28: 2 g via INTRAVENOUS
  Filled 2021-08-28: qty 2

## 2021-08-28 MED ORDER — PROCHLORPERAZINE MALEATE 10 MG PO TABS
10.0000 mg | ORAL_TABLET | Freq: Four times a day (QID) | ORAL | Status: DC | PRN
  Administered 2021-08-30: 10 mg via ORAL
  Filled 2021-08-28: qty 1

## 2021-08-28 MED ORDER — POTASSIUM CHLORIDE CRYS ER 20 MEQ PO TBCR
40.0000 meq | EXTENDED_RELEASE_TABLET | Freq: Once | ORAL | Status: AC
  Administered 2021-08-28: 40 meq via ORAL
  Filled 2021-08-28: qty 2

## 2021-08-28 MED ORDER — SODIUM CHLORIDE 0.9 % IV SOLN
2.0000 g | Freq: Two times a day (BID) | INTRAVENOUS | Status: DC
  Administered 2021-08-28 – 2021-08-29 (×3): 2 g via INTRAVENOUS
  Filled 2021-08-28 (×3): qty 2

## 2021-08-28 MED ORDER — SODIUM CHLORIDE 0.9 % IV BOLUS
2000.0000 mL | Freq: Once | INTRAVENOUS | Status: AC
  Administered 2021-08-28: 2000 mL via INTRAVENOUS

## 2021-08-28 MED ORDER — ASPIRIN EC 81 MG PO TBEC
81.0000 mg | DELAYED_RELEASE_TABLET | Freq: Every day | ORAL | Status: DC
  Administered 2021-08-28 – 2021-08-30 (×3): 81 mg via ORAL
  Filled 2021-08-28 (×4): qty 1

## 2021-08-28 MED ORDER — LEVOTHYROXINE SODIUM 112 MCG PO TABS
112.0000 ug | ORAL_TABLET | Freq: Every day | ORAL | Status: DC
  Administered 2021-08-28 – 2021-08-31 (×4): 112 ug via ORAL
  Filled 2021-08-28 (×4): qty 1

## 2021-08-28 MED ORDER — LEVOTHYROXINE SODIUM 112 MCG PO TABS: 112.0000 ug | ORAL_TABLET | Freq: Every day | ORAL | Status: DC

## 2021-08-28 MED ORDER — OXYCODONE HCL 5 MG PO TABS
5.0000 mg | ORAL_TABLET | Freq: Four times a day (QID) | ORAL | Status: DC | PRN
  Administered 2021-08-28: 5 mg via ORAL
  Filled 2021-08-28: qty 1

## 2021-08-28 MED ORDER — IOHEXOL 350 MG/ML SOLN
75.0000 mL | Freq: Once | INTRAVENOUS | Status: AC | PRN
  Administered 2021-08-28: 75 mL via INTRAVENOUS

## 2021-08-28 MED ORDER — SENNOSIDES-DOCUSATE SODIUM 8.6-50 MG PO TABS
1.0000 | ORAL_TABLET | Freq: Every evening | ORAL | Status: DC | PRN
  Administered 2021-08-30: 1 via ORAL
  Filled 2021-08-28: qty 1

## 2021-08-28 MED ORDER — ACETAMINOPHEN 325 MG PO TABS
650.0000 mg | ORAL_TABLET | Freq: Four times a day (QID) | ORAL | Status: DC | PRN
  Administered 2021-08-28 – 2021-08-29 (×3): 650 mg via ORAL
  Filled 2021-08-28 (×3): qty 2

## 2021-08-28 NOTE — ED Notes (Signed)
Went in to check on patient. Patient had just finished up with Echocardiogram. Member finishing meal tray. Denies pain or any needs at this time. Resting in bed with side rails up with call bell in reach. JRP, RN

## 2021-08-28 NOTE — ED Provider Notes (Signed)
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  Had chemotherapy performed today.  I received patient in signout from Dr. Tomi Bamberger.  Patient clinically looking well plan for UA chest x-ray COVID test and then if unremarkable likely will discharge home with close follow-up.  His UA and chest x-ray resulted without obvious infection.  However I was notified by the nursing staff that his blood pressure had trended downwards.  In the 96P systolic.  Code sepsis was initiated.  We will give broad-spectrum antibiotics.  Per the family the patient has not been eating and drinking very well recently.  Could be hypovolemia but with fever must consider infectious source.  Will discuss with medicine for admission.  Blood pressure has improved with IV fluids.  Patient feeling mildly better.  CRITICAL CARE Performed by: Cecilio Asper   Total critical care time: 35 minutes  Critical care time was exclusive of separately billable procedures and treating other patients.  Critical care was necessary to treat or prevent imminent or life-threatening deterioration.  Critical care was time spent personally by me on the following activities: development of treatment plan with patient and/or surrogate as well as nursing, discussions with consultants, evaluation of patient's response to treatment, examination of patient, obtaining history from patient or surrogate, ordering and performing treatments and interventions, ordering and review of laboratory studies, ordering and review of radiographic studies, pulse oximetry and re-evaluation of patient's condition.      Deno Etienne, DO 08/28/21 [phone]

## 2021-08-28 NOTE — H&P (Signed)
History and Physical    Angel Keller MBT:[id number] DOB: [birth year] DOA: 08/27/2021  PCP: Ronnell Freshwater, NP   Patient coming from: Home  Chief Complaint: Fever, fatigue  HPI: Angel Keller. is a 81 y.o. male with medical history significant for pancreatic cancer, HLD, HTN, hypothyroidism who presents for evaluation of fever that started last night after having chemotherapy of Gemzar for his pancreatic cancer.  Patient had a work-up in the emergency room for source of fever with a negative UA and negative chest x-ray.  While in the emergency room his blood pressure dropped to the 80/40 range and he required IV fluid hydration to bring it back to baseline.  He was started on broad-spectrum antibiotic for suspected sepsis although patient has no source of infection.  He denies any abdominal pain, cough, shortness of breath he reports he has lost 20 to 25 pounds in the last 3 to 4 weeks and has decreased appetite.  Denies any rash.  Denies any loss of consciousness.  He has not had chest pain or pressure but he occasionally has palpitations he states.  He reports a few years ago he had a blood clot in his calf and was on Eliquis for a few months but has been off of it for the past few years.  He does have a Port-A-Cath with no surrounding erythema or drainage.  ED Course: Tmax 101 F, BP 80-140/48-80, HR 45-95, RR 20-27.  Chest x-ray is negative for infiltrate or consolidation.  Urinalysis is negative.  Lab work shows WBC of 4000 hemoglobin 11.3 hematocrit 33.5 platelets 141,000 lactic acid 1.4 sodium 131 potassium 3.3 chloride 97 bicarb 25 creatinine 1.10 BUN 30 glucose 145 alkaline phosphatase 112 AST 37 ALT 38 bilirubin 2.2 albumin 3.1.  COVID swab negative.  Influenza A and B are negative.  There is noted while examining the patient that he has an irregular heart rhythm and is found to be in atrial fibrillation with normal heart rate.  Started on Eliquis.  Hospitalist service asked to admit  patient for further management  Review of Systems:  General: Reports fever, chills. Denies night sweats.  Denies dizziness.  HENT: Denies head trauma, headache, denies change in hearing, tinnitus. Denies nasal bleeding. Denies sore throat  Denies difficulty swallowing Eyes: Denies blurry vision, pain in eye, drainage.  Denies discoloration of eyes. Neck: Denies pain.  Denies swelling.  Denies pain with movement. Cardiovascular: Reports palpitations intermittently. Denies chest pain.  Denies edema.  Denies orthopnea Respiratory: Denies shortness of breath, cough.  Denies wheezing.  Denies sputum production Gastrointestinal: Denies abdominal pain, swelling. Denies vomiting, diarrhea.  Denies melena.  Denies hematemesis. Musculoskeletal: Denies limitation of movement.  Denies deformity or swelling. Denies arthralgias or myalgias. Genitourinary: Denies pelvic pain.  Denies urinary frequency or hesitancy. Denies dysuria.  Skin: Denies rash.  Denies petechiae, purpura, ecchymosis. Neurological: Denies syncope.  Denies seizure activity. Denies slurred speech, drooping face. Denies visual change. Psychiatric: Denies depression, anxiety. Denies hallucinations.  Past Medical History:  Diagnosis Date   Aneurysm ([place])    2017   Anxiety    Arthritis    Depression    Headache    HLD (hyperlipidemia)    Hx of inguinal hernia repair    Hypertension    Hypothyroidism    Overactive bladder    Pancreatic cancer ([place])    Pneumonia    Shingles    Sleep apnea    Patient denies   Thoracic ascending aortic aneurysm  mildly dilated 4.0cm per 08/12/21 CT   Thyroid disease    Varicose veins of both lower extremities     Past Surgical History:  Procedure Laterality Date   BILATERAL CARPAL TUNNEL RELEASE     CATARACT EXTRACTION, BILATERAL Bilateral    ELBOW SURGERY Bilateral    ENDOSCOPIC RETROGRADE CHOLANGIOPANCREATOGRAPHY (ERCP) WITH PROPOFOL N/A 07/20/2021   Procedure: ENDOSCOPIC RETROGRADE  CHOLANGIOPANCREATOGRAPHY (ERCP) WITH PROPOFOL;  Surgeon: Gatha Mayer, MD;  Location[facility];  Service: Endoscopy;  Laterality: N/A;   ESOPHAGOGASTRODUODENOSCOPY (EGD) WITH PROPOFOL N/A 08/05/2021   Procedure: ESOPHAGOGASTRODUODENOSCOPY (EGD) WITH PROPOFOL;  Surgeon: Rush Landmark Telford Nab., MD;  Location: [place];  Service: Gastroenterology;  Laterality: N/A;   EUS N/A 08/05/2021   Procedure: UPPER ENDOSCOPIC ULTRASOUND (EUS) RADIAL;  Surgeon: Irving Copas., MD;  Location: [place];  Service: Gastroenterology;  Laterality: N/A;   FINE NEEDLE ASPIRATION  08/05/2021   Procedure: FINE NEEDLE ASPIRATION (FNA) LINEAR;  Surgeon: Irving Copas., MD;  Location: [place];  Service: Gastroenterology;;   INGUINAL HERNIA REPAIR Right 1951   IR ANGIO INTRA EXTRACRAN SEL COM CAROTID INNOMINATE BILAT MOD SED  02/20/2019   IR ENDOLUMINAL BX OF BILIARY TREE  07/22/2021   IR GENERIC HISTORICAL  10/28/2016   IR RADIOLOGIST EVAL & MGMT 10/28/2016 [facility] RAD   IR INT EXT BILIARY DRAIN WITH CHOLANGIOGRAM  07/22/2021   PORTACATH PLACEMENT N/A 08/19/2021   Procedure: INSERTION PORT-A-CATH;  Surgeon: Dwan Bolt, MD;  Location: [facility];  Service: General;  Laterality: N/A;  LMA   RADIOLOGY WITH ANESTHESIA N/A 10/21/2015   Procedure: RADIOLOGY WITH ANESTHESIA;  Surgeon: Luanne Bras, MD;  Location: [place];  Service: Radiology;  Laterality: N/A;   RADIOLOGY WITH ANESTHESIA N/A 02/20/2019   Procedure: [employer];  Surgeon: Luanne Bras, MD;  Location: [place];  Service: Radiology;  Laterality: N/A;   ROTATOR CUFF REPAIR Bilateral    VARICOSE VEIN SURGERY      Social History  reports that he quit smoking about 60 years ago. His smoking use included cigarettes. He has a 0.25 pack-year smoking history. He has never used smokeless tobacco. He reports that he does not drink alcohol and does not use drugs.  No Known Allergies  Family History  Problem Relation Age of Onset    Breast cancer Mother    Lung cancer Father    Alcoholism Sister    Cirrhosis Sister    Colon cancer Neg Hx    Esophageal cancer Neg Hx    Liver cancer Neg Hx    Pancreatic cancer Neg Hx    Prostate cancer Neg Hx      Prior to Admission medications   Medication Sig Start Date End Date Taking? Authorizing Provider  aspirin EC 81 MG tablet Take 1 tablet (81 mg total) by mouth daily. Hold while on blood thinner/eliquis for 3 months, then resume Patient taking differently: Take 81 mg by mouth daily. 07/30/20   Rai, Vernelle Emerald, MD  bisoprolol-hydrochlorothiazide (ZIAC) 2.5-6.25 MG tablet Take 1 tablet by mouth every morning. 06/20/15   [provider]  Cholecalciferol (VITAMIN D3) 50 MCG (2000 UT) TABS Take 2,000 Units by mouth daily.    [provider]  escitalopram (LEXAPRO) 20 MG tablet Take 20 mg by mouth daily. 12/22/18   [provider]  famotidine (PEPCID) 20 MG tablet Take 1 tablet (20 mg total) by mouth 2 (two) times daily. 08/06/21   Mansouraty, Telford Nab., MD  lidocaine-prilocaine (EMLA) cream Apply to affected area  once 08/14/21   Truitt Merle, MD  mirtazapine (REMERON) 7.5 MG tablet Take 1 tablet (7.5 mg total) by mouth at bedtime. 08/26/21   Ronnell Freshwater, NP  ondansetron (ZOFRAN) 8 MG tablet Take 1 tablet (8 mg total) by mouth 2 (two) times daily as needed (Nausea or vomiting). 08/14/21   Truitt Merle, MD  ondansetron (ZOFRAN-ODT) 4 MG disintegrating tablet Take 1 tablet (4 mg total) by mouth every 8 (eight) hours as needed for nausea or vomiting. 07/27/21   Ronnell Freshwater, NP  oxyCODONE (OXY IR/ROXICODONE) 5 MG immediate release tablet Take 1 tablet (5 mg total) by mouth every 6 (six) hours as needed for moderate pain. 07/23/21   Ghimire, Henreitta Leber, MD  pantoprazole (PROTONIX) 40 MG tablet Take 1 tablet (40 mg total) by mouth daily. 07/12/21   Ronnell Freshwater, NP  prochlorperazine (COMPAZINE) 10 MG tablet Take 1 tablet (10 mg total) by mouth every 6 (six)  hours as needed (Nausea or vomiting). 08/14/21   Truitt Merle, MD  Sodium Chloride Flush (NORMAL SALINE FLUSH) 0.9 % SOLN Flush tube once daily with 1 syringe. 08/12/21 09/11/21  Truitt Merle, MD  sucralfate (CARAFATE) 1 GM/10ML suspension Take 10 mLs (1 g total) by mouth 2 (two) times daily. Patient taking differently: Take 1 g by mouth daily. 08/05/21 08/05/22  Mansouraty, Telford Nab., MD  SYNTHROID 112 MCG tablet Take 1 tablet (112 mcg total) by mouth daily before breakfast. 08/26/21   Ronnell Freshwater, NP  vitamin B-12 (CYANOCOBALAMIN) 1000 MCG tablet Take 1,000 mcg by mouth 2 (two) times daily.    [provider]    Physical Exam: Vitals:   08/28/21 0115 08/28/21 0130 08/28/21 0200 08/28/21 0230  BP: (!) 81/48 (!) 105/55 (!) 81/55 109/60  Pulse: (!) 124 71 70 (!) 41  Resp: (!) 24 (!) 25 (!) 22 20  Temp:      TempSrc:      SpO2: 95% 97% 96% 98%    Constitutional: NAD, calm, comfortable Vitals:   08/28/21 0115 08/28/21 0130 08/28/21 0200 08/28/21 0230  BP: (!) 81/48 (!) 105/55 (!) 81/55 109/60  Pulse: (!) 124 71 70 (!) 41  Resp: (!) 24 (!) 25 (!) 22 20  Temp:      TempSrc:      SpO2: 95% 97% 96% 98%   General: WDWN, Alert and oriented x3.  Eyes: EOMI, PERRL, conjunctivae normal.  Sclera nonicteric HENT:  [place]/AT, external ears normal.  Nares patent without epistasis.  Mucous membranes are dry. Posterior pharynx clear of any exudate. Poor dentition Neck: Soft, normal range of motion, supple, no masses,Trachea midline Respiratory: clear to auscultation bilaterally, no wheezing, no crackles. Normal respiratory effort. No accessory muscle use.  Cardiovascular: Irregularly irregular rhythm, no murmurs / rubs / gallops. No extremity edema. 1+ pedal pulses.  Abdomen: Soft, no tenderness, nondistended, no rebound or guarding.  No masses palpated. Bowel sounds normoactive Musculoskeletal: FROM. no cyanosis. No joint deformity upper and lower extremities. Normal muscle tone.  Skin:  Warm, dry, intact no rashes, lesions, ulcers. No induration Neurologic: CN 2-12 grossly intact.  Normal speech.  Sensation intact to touch. Strength 5/5 in all extremities.   Psychiatric: Normal judgment and insight.  Normal mood.    Labs on Admission: I have personally reviewed following labs and imaging studies  CBC: Recent Labs  Lab 08/27/21 1049 08/27/21 2206  WBC 4.7 4.0  NEUTROABS 2.6 3.1  HGB 11.3* 11.3*  HCT 32.9* 33.5*  MCV 78.7* 80.3  PLT 175 141*    Basic Metabolic Panel: Recent Labs  Lab 08/27/21 1049 08/27/21 2206  NA 132* 131*  K 3.9 3.3*  CL 95* 97*  CO2 28 25  GLUCOSE 108* 145*  BUN 24* 30*  CREATININE 1.21 1.10  CALCIUM 9.2 8.4*    GFR: Estimated Creatinine Clearance: 49.5 mL/min (by C-G formula based on SCr of 1.1 mg/dL).  Liver Function Tests: Recent Labs  Lab 08/27/21 1049 08/27/21 2206  AST 36 37  ALT 38 38  ALKPHOS 128* 112  BILITOT 2.2* 2.2*  PROT 7.2 6.6  ALBUMIN 3.7 3.1*    Urine analysis:    Component Value Date/Time   COLORURINE YELLOW 08/27/2021 2206   APPEARANCEUR CLEAR 08/27/2021 2206   LABSPEC 1.015 08/27/2021 2206   PHURINE 5.0 08/27/2021 2206   GLUCOSEU NEGATIVE 08/27/2021 2206   GLUCOSEU NEGATIVE 11/07/2008 0000   HGBUR SMALL (A) 08/27/2021 2206   BILIRUBINUR NEGATIVE 08/27/2021 2206   KETONESUR NEGATIVE 08/27/2021 2206   PROTEINUR NEGATIVE 08/27/2021 2206   UROBILINOGEN 1.0 07/09/2009 2019   NITRITE NEGATIVE 08/27/2021 2206   LEUKOCYTESUR NEGATIVE 08/27/2021 2206    Radiological Exams on Admission: DG Chest Port 1 View  Result Date: 08/27/2021 CLINICAL DATA:  Questionable sepsis - evaluate for abnormality EXAM: PORTABLE CHEST 1 VIEW.  Patient is rotated COMPARISON:  Chest x-ray 08/19/2021, CT chest 08/12/2021 FINDINGS: Right chest wall Port-A-Cath with tip overlying the distal superior vena cava. The heart and mediastinal contours are unchanged. Atherosclerotic plaque No focal consolidation. No pulmonary edema.  No pleural effusion. No pneumothorax. No acute osseous abnormality. IMPRESSION: 1. No active disease. 2.  Aortic Atherosclerosis (ICD10-I70.0). Electronically Signed   By: Iven Finn M.D.   On: 08/27/2021 23:07    EKG: Independently reviewed.  EKG shows atrial fibrillation with normal rate.  No acute ST elevation or depression.  QTc prolonged at 507  Assessment/Plan Principal Problem:   AF (paroxysmal atrial fibrillation) Mr. Beville is admitted to stepdown unit with a-fib and SIRS.   Started on eliquis for anticoagulation with a-fib.  Obtain Echocardiogram in the am  Active Problems:   Hypotension While in the ER the BP dropped to 80/40 range. Was given IVF bolus which brought BP up to 108/60 range.  Will continue to monitor BP. Continue LR IVF hydration    SIRS (systemic inflammatory response syndrome) Pt with fever, low BP, tachycardia but no source of infection. CXR negative. UA negative.  Blood cultures obtained in ER and will be monitored. Check CTA chest to make sure fever not due to PE. Pt has risk of PE with pancreatic cancer    Hypokalemia Check magnesium level, replete if low.  Replete potassium    Hyponatremia IVF hydration with LR. Monitor electrolytes and renal function.    Fever Tmax of 101 F. Given tylenol.  No source of infection identified. Unclear if due to chemotherapy or a interleukin response to the therapy. Check for PE as it could cause low grade fever.     Prolonged QT interval Avoid medications that can further prolong QT interval    Pancreatic cancer  Followed by oncology   DVT prophylaxis: Placed on Eliquis as now in A-fib  Code Status:   Full Code  Family Communication:  Diagnosis and plan discussed with patient.  Patient verbally understanding agrees with plan.  Further recommendation follow as clinical indicated Disposition Plan:   Patient is from:  Home  Anticipated DC to:  Home  Anticipated DC date:  Anticipate 2  midnight or more stay  in the hospital  Time spent on admission: 75 minutes  Admission status:  Inpatient   Yevonne Aline Danea Manter MD [facility]  How to contact the [facility] Attending or Consulting provider [place] or covering provider during after hours [place], for this patient?   Check the care team in [facility] and look for a) attending/consulting [facility] provider listed and b) the [facility] team listed Log into [email or web address] and use [place]'s universal password to access. If you do not have the password, please contact the hospital operator. Locate the [facility] provider you are looking for under [facility] and page to a number that you can be directly reached. If you still have difficulty reaching the provider, please page the [facility] (Director on Call) for the Hospitalists listed on amion for assistance.  08/28/2021, 3:40 AM

## 2021-08-28 NOTE — Progress Notes (Signed)
The patient is an 81 yr old man who was admitted earlier this morning by my colleague, Dr. Tonie Griffith. He presented to [facility] on 06/28/2022 after a fever that began on the night of 08/27/2021. The patient has pancreatic cancer and had chemotherapy Gemzar as treatment prior to developing fever. When he arrived in the ED he was hypotensive with BP 80/40 which responded to IV fluids. No source of infection was found, but he was started on broad spectrum IV antibiotics.   [facility] were consulted to admit the patient for further evaluation and care. Vitals and labs were reviewed.   I have seen and examined this patient myself. Heart and lung sounds are within normal limits. Abdomen is soft, non-tender, non-distended. He is somnolent, but easily awakened.

## 2021-08-28 NOTE — Progress Notes (Signed)
PHARMACY - PHYSICIAN COMMUNICATION CRITICAL VALUE ALERT - BLOOD CULTURE IDENTIFICATION (BCID)  Angel Keller. is an 82 y.o. male who presented to [place] on 08/27/2021 with a chief complaint of fever post-chemo  Assessment:  1/13 Blood cx x2 obtained, 1/14 BCID with 1 of 4 bottles (anaerobic) + for Strep species  Name of physician (or Provider) Contacted: Dr Benny Lennert  Current antibiotics: Cefepime 2gm q12, would adequately cover Step if significant  Changes to prescribed antibiotics recommended:  Patient is on recommended antibiotics - No changes needed  Results for orders placed or performed during the hospital encounter of 08/27/21  Blood Culture ID Panel (Reflexed) (Collected: 08/27/2021 10:06 PM)  Result Value Ref Range   Enterococcus faecalis NOT DETECTED NOT DETECTED   Enterococcus Faecium NOT DETECTED NOT DETECTED   Listeria monocytogenes NOT DETECTED NOT DETECTED   Staphylococcus species NOT DETECTED NOT DETECTED   Staphylococcus aureus (BCID) NOT DETECTED NOT DETECTED   Staphylococcus epidermidis NOT DETECTED NOT DETECTED   Staphylococcus lugdunensis NOT DETECTED NOT DETECTED   Streptococcus species DETECTED (A) NOT DETECTED   Streptococcus agalactiae NOT DETECTED NOT DETECTED   Streptococcus pneumoniae NOT DETECTED NOT DETECTED   Streptococcus pyogenes NOT DETECTED NOT DETECTED   A.calcoaceticus-baumannii NOT DETECTED NOT DETECTED   Bacteroides fragilis NOT DETECTED NOT DETECTED   Enterobacterales NOT DETECTED NOT DETECTED   Enterobacter cloacae complex NOT DETECTED NOT DETECTED   Escherichia coli NOT DETECTED NOT DETECTED   Klebsiella aerogenes NOT DETECTED NOT DETECTED   Klebsiella oxytoca NOT DETECTED NOT DETECTED   Klebsiella pneumoniae NOT DETECTED NOT DETECTED   Proteus species NOT DETECTED NOT DETECTED   Salmonella species NOT DETECTED NOT DETECTED   Serratia marcescens NOT DETECTED NOT DETECTED   Haemophilus influenzae NOT DETECTED NOT DETECTED   Neisseria  meningitidis NOT DETECTED NOT DETECTED   Pseudomonas aeruginosa NOT DETECTED NOT DETECTED   Stenotrophomonas maltophilia NOT DETECTED NOT DETECTED   Candida albicans NOT DETECTED NOT DETECTED   Candida auris NOT DETECTED NOT DETECTED   Candida glabrata NOT DETECTED NOT DETECTED   Candida krusei NOT DETECTED NOT DETECTED   Candida parapsilosis NOT DETECTED NOT DETECTED   Candida tropicalis NOT DETECTED NOT DETECTED   Cryptococcus neoformans/gattii NOT DETECTED NOT DETECTED    Minda Ditto 08/28/2021  5:04 PM

## 2021-08-28 NOTE — ED Notes (Signed)
Patient sitting up in bed, awake and alert. He had finished his meal tray. IV to left AC- 20g flushed and patent. No other needs at this time per patient. JRP, RN

## 2021-08-28 NOTE — Progress Notes (Signed)
Pharmacy Antibiotic Note  Angel Keller. is a 81 y.o. male admitted on 08/27/2021 with a history of hypertension, thyroid disease, hyperlipidemia, thoracic ascending aortic aneurysm and pancreatic cancer currently undergoing active treatments.  Patient presented today after feeling very chilled.  Pharmacy has been consulted to dose cefepime for sepsis.  Plan: Cefepime 2gm IV q12h Follow renal function,cultures and clinical course     Temp (24hrs), Avg:99.4 F (37.4 C), Min:98.6 F (37 C), Max:101 F (38.3 C)  Recent Labs  Lab 08/27/21 1049 08/27/21 2206  WBC 4.7 4.0  CREATININE 1.21 1.10  LATICACIDVEN  --  1.4    Estimated Creatinine Clearance: 49.5 mL/min (by C-G formula based on SCr of 1.1 mg/dL).    No Known Allergies  Antimicrobials this admission: 1/14 >>  Dose adjustments this admission:   Microbiology results: 1/13 BCx:  1/13 UCx:   Thank you for allowing pharmacy to be a part of this patients care.  Dolly Rias RPh 08/28/2021, 1:53 AM

## 2021-08-28 NOTE — ED Notes (Signed)
Patient reports pain is 7/10 in back and neck. Patient states he is "crunched up" in bed. This nurse and nurse Leveda Anna repositioned patient with success. Patient educated on prn pain options available to him, patient states "I do not like to take drugs". Patient declines medication pain interventions at this time.

## 2021-08-28 NOTE — Sepsis Progress Note (Signed)
Following for Sepsis monitoring

## 2021-08-28 NOTE — ED Notes (Signed)
Pt placed on bedpan, but didn't have a bm. Pt changed into hospital gown. Warm blankets provided.

## 2021-08-28 NOTE — Progress Notes (Signed)
Echocardiogram 2D Echocardiogram has been performed.  Arlyss Gandy 08/28/2021, 11:51 AM

## 2021-08-29 DIAGNOSIS — I480 Paroxysmal atrial fibrillation: Secondary | ICD-10-CM | POA: Diagnosis not present

## 2021-08-29 LAB — PROCALCITONIN: Procalcitonin: 0.63 ng/mL

## 2021-08-29 MED ORDER — SODIUM CHLORIDE 0.9 % IV BOLUS
500.0000 mL | Freq: Once | INTRAVENOUS | Status: AC
  Administered 2021-08-29: 500 mL via INTRAVENOUS

## 2021-08-29 MED ORDER — CHLORHEXIDINE GLUCONATE CLOTH 2 % EX PADS
6.0000 | MEDICATED_PAD | Freq: Every day | CUTANEOUS | Status: DC
  Administered 2021-08-29: 6 via TOPICAL

## 2021-08-29 MED ORDER — SODIUM CHLORIDE 0.9% FLUSH
10.0000 mL | Freq: Two times a day (BID) | INTRAVENOUS | Status: DC
  Administered 2021-08-29 – 2021-08-30 (×2): 10 mL

## 2021-08-29 MED ORDER — PANTOPRAZOLE SODIUM 40 MG PO TBEC
40.0000 mg | DELAYED_RELEASE_TABLET | Freq: Every day | ORAL | Status: DC
  Administered 2021-08-29 – 2021-08-31 (×3): 40 mg via ORAL
  Filled 2021-08-29 (×3): qty 1

## 2021-08-29 MED ORDER — SODIUM CHLORIDE 0.9 % IV SOLN
2.0000 g | Freq: Three times a day (TID) | INTRAVENOUS | Status: DC
  Administered 2021-08-29 – 2021-08-30 (×2): 2 g via INTRAVENOUS
  Filled 2021-08-29 (×2): qty 2

## 2021-08-29 MED ORDER — ALUM & MAG HYDROXIDE-SIMETH 200-200-20 MG/5ML PO SUSP: 15.0000 mL | Freq: Four times a day (QID) | ORAL | Status: DC | PRN

## 2021-08-29 MED ORDER — DILTIAZEM HCL 30 MG PO TABS: 30.0000 mg | ORAL_TABLET | Freq: Three times a day (TID) | ORAL | Status: DC

## 2021-08-29 MED ORDER — DILTIAZEM HCL 30 MG PO TABS
30.0000 mg | ORAL_TABLET | Freq: Three times a day (TID) | ORAL | Status: DC
  Administered 2021-08-29 – 2021-08-31 (×6): 30 mg via ORAL
  Filled 2021-08-29 (×8): qty 1

## 2021-08-29 MED ORDER — SODIUM CHLORIDE 0.9% FLUSH: 10.0000 mL | INTRAVENOUS | Status: DC | PRN

## 2021-08-29 NOTE — Plan of Care (Signed)
  Problem: Education: Goal: Knowledge of General Education information will improve Description: Including pain rating scale, medication(s)/side effects and non-pharmacologic comfort measures Outcome: Progressing   Problem: Coping: Goal: Level of anxiety will decrease Outcome: Progressing   Problem: Pain Managment: Goal: General experience of comfort will improve Outcome: Progressing   

## 2021-08-29 NOTE — Progress Notes (Signed)
PROGRESS NOTE  Angel Keller QPY:[id number] DOB: [birth year] DOA: 08/27/2021 PCP: Angel Freshwater, NP  Brief History   The patient is an 81 yr old man who was admitted earlier this morning by my colleague, Dr. Tonie Griffith. He presented to [facility] on 06/28/2022 after a fever that began on the night of 08/27/2021. The patient has pancreatic cancer and had chemotherapy Gemzar as treatment prior to developing fever. When he arrived in the ED he was hypotensive with BP 80/40 which responded to IV fluids. No source of infection was found, but he was started on broad spectrum IV antibiotics.    [facility] were consulted to admit the patient for further evaluation and care.  He is receiving cefepime for treatment of unknown infectious source. He converted to atrial fibrillation at 0500 this morning. HR 99-134 this morning. BP too low for diltiazem or orther rate limiting med. Patient is asymptomatic. He has been started on Eliquis.  1/2 Blood cultures has grown out strep oralis. Blood cultures have been repeated.  He states that he feels tired.  Consultants    Procedures    Antibiotics   Anti-infectives (From admission, onward)    Start     Dose/Rate Route Frequency Ordered Stop   08/28/21 1400  ceFEPIme (MAXIPIME) 2 g in sodium chloride 0.9 % 100 mL IVPB  Status:  Discontinued        2 g 200 mL/hr over 30 Minutes Intravenous Every 12 hours 08/28/21 0151 08/28/21 0410   08/28/21 1400  ceFEPIme (MAXIPIME) 2 g in sodium chloride 0.9 % 100 mL IVPB        2 g 200 mL/hr over 30 Minutes Intravenous Every 12 hours 08/28/21 0410     08/28/21 0100  ceFEPIme (MAXIPIME) 2 g in sodium chloride 0.9 % 100 mL IVPB        2 g 200 mL/hr over 30 Minutes Intravenous  Once 08/28/21 0046 08/28/21 1201       Subjective  The patient states that he is feeling fatigued. Otherwise he is feeling well. No chest pain. No shortness of breath.  Objective   Vitals:  Vitals:   08/29/21 1148 08/29/21 1235   BP: (!) 62/42   Pulse: 73   Resp: (!) 21   Temp:  98.3 F (36.8 C)  SpO2: 96%     Exam:  Constitutional:  The patient is awake, alert, and oriented x 3. No acute distress. Respiratory:  No increased work of breathing. No wheezes, rales, or rhonchi No tactile fremitus Cardiovascular:  Irregular rate and rhythm No murmurs, ectopy, or gallups. No lateral PMI. No thrills. Abdomen:  Abdomen is soft, non-tender, non-distended No hernias, masses, or organomegaly Normoactive bowel sounds.  Musculoskeletal:  No cyanosis, clubbing, or edema Skin:  No rashes, lesions, ulcers palpation of skin: no induration or nodules Neurologic:  CN 2-12 intact Sensation all 4 extremities intact Psychiatric:  Mental status Mood, affect appropriate Orientation to person, place, time  judgment and insight appear intact  I have personally reviewed the following:   Today's Data  Vitals  Lab Data  CBC, BMP  Micro Data  1/2 Blood cultures from 1/13 has grown out strep oralis. Second set drawn 08/29/2021  Imaging  CTA chest  Cardiology Data  EKG 08/29/2021 EKG 08/27/2021 Echocardiogram  Other Data    Scheduled Meds:  apixaban  5 mg Oral BID   aspirin EC  81 mg Oral Daily   Chlorhexidine Gluconate Cloth  6 each Topical Daily  diltiazem  30 mg Oral Q8H   levothyroxine  112 mcg Oral Q0600   Continuous Infusions:  ceFEPime (MAXIPIME) IV Stopped (08/29/21 0240)    Principal Problem:   AF (paroxysmal atrial fibrillation) (HCC) Active Problems:   Pancreatic cancer (HCC)   SIRS (systemic inflammatory response syndrome) (HCC)   Hypotension   Hypokalemia   Hyponatremia   Fever   Prolonged QT interval   LOS: 1 day   A & P  AF (paroxysmal atrial fibrillation): Angel Keller is admitted to stepdown unit with a-fib and SIRS.  Started on eliquis for anticoagulation with a-fib. Echocardiogram has demonstrated EF of 60-65%, normal LV function, no regional wall motion abnormalities.  There was Grade 1 diastolic dysfunction. He has normal RV size and function. Normal pulmonary artery systolic pressure. Right and left atria are dilated. Tricuspid tricuspid valve. Sclerosis/calcification is present.  Bacteremia: 1/2 blood cultures collected on 08/27/2021 grew out strep oralis. Pt is on cefepime. Blood cultures repeated.   Hypotension While in the ER the BP dropped to 80/40 range. Was given IVF bolus which brought BP up to 108/60 range. BP improved on 08/28/2021, but SBP in the 70's on 1/15. 1L bolus given. Pt is in Atrial fibrillation. HR in the 70's. Will continue to monitor BP.   SIRS (systemic inflammatory response syndrome): Continued fever and hypotension. 1/2 blood cultures from 08/27/2021 positive for strep oralis. Blood cultures repeated. Check CTA chest was without acute acute pathology. No PE.  Pt has risk of PE with pancreatic cancer   Hypokalemia: Resolved.   Hyponatremia: Resolved.    Prolonged QT interval: Resolved. Avoid medications that can further prolong QT interval   Pancreatic cancer: Biopsied on 08/05/2021. Pancreatic protocol CT demonstrated 3/6x 2.6 cm pancreatic head mass that was judged to be borderline resectable. Dr. Zenia Resides has evaluated the patient and recommended neoadjuvant therapy. He had one dose of gemcitabine alone on 08/20/2021 and one dose on 08/27/2021 with C1D8. The plan was to add Abraxane on the next cycle if his LFT's improves. Followed by Dr. Burr Medico of oncology.   I have seen and examined this patient myself. I have spent 35 minutes in his evaluation and care.   DVT prophylaxis:      Placed on Eliquis as now in A-fib  Code Status:              Full Code  Family Communication:       Diagnosis and plan discussed with patient.  Patient verbally understanding agrees with plan.  Further recommendation follow as clinical indicated Disposition Plan:              Patient is from:                        Home             Anticipated DC to:                    Home             Anticipated DC date:               Anticipate 2 midnight or more stay in the hospital             Leata Dominy, DO [facility] Direct contact: see [email or web address]  7PM-7AM contact night coverage as above 08/29/2021, 1:26 PM  LOS: 1 day

## 2021-08-29 NOTE — Discharge Instructions (Addendum)
Information on my medicine - ELIQUIS (apixaban)  This medication education was reviewed with me or my healthcare representative as part of my discharge preparation.  Why was Eliquis prescribed for you? Eliquis was prescribed for you to reduce the risk of a blood clot forming that can cause a stroke if you have a medical condition called atrial fibrillation (a type of irregular heartbeat).  What do You need to know about Eliquis ? Take your Eliquis TWICE DAILY - one tablet in the morning and one tablet in the evening with or without food. If you have difficulty swallowing the tablet whole please discuss with your pharmacist how to take the medication safely.  Take Eliquis exactly as prescribed by your doctor and DO NOT stop taking Eliquis without talking to the doctor who prescribed the medication.  Stopping may increase your risk of developing a stroke.  Refill your prescription before you run out.  After discharge, you should have regular check-up appointments with your healthcare provider that is prescribing your Eliquis.  In the future your dose may need to be changed if your kidney function or weight changes by a significant amount or as you get older.  What do you do if you miss a dose? If you miss a dose, take it as soon as you remember on the same day and resume taking twice daily.  Do not take more than one dose of ELIQUIS at the same time to make up a missed dose.  Important Safety Information A possible side effect of Eliquis is bleeding. You should call your healthcare provider right away if you experience any of the following: Bleeding from an injury or your nose that does not stop. Unusual colored urine (red or dark brown) or unusual colored stools (red or black). Unusual bruising for unknown reasons. A serious fall or if you hit your head (even if there is no bleeding).  Some medicines may interact with Eliquis and might increase your risk of bleeding or clotting while  on Eliquis. To help avoid this, consult your healthcare provider or pharmacist prior to using any new prescription or non-prescription medications, including herbals, vitamins, non-steroidal anti-inflammatory drugs (NSAIDs) and supplements.  This website has more information on Eliquis (apixaban): http://[email or web address].com/eliquis/home      High Calorie, High Protein Nutrition Therapy  A high-calorie, high-protein diet has been recommended to you. Your registered dietitian nutritionist (RDN) may have recommended this diet because you are having difficulty eating enough calories throughout the day, you have lost weight, and/or you need to add protein to your diet.  Sometimes you may not feel like eating, even if you know the importance of good nutrition. The recommendations in this handout can help you with the following:  Regaining your strength and energy Keeping your body healthy Healing and recovering from surgery or illness and fighting infection  Schedule Your Meals and Snacks  Several small meals and snacks are often better tolerated and digested than large meals.  Strategies  Plan to eat 3 meals and 3 snacks daily. Experiment with timing meals to find out when you have a larger appetite. Appetite may be greatest in the morning after not eating all night so you may prefer to eat your larger meals and snacks in the morning and at lunch. Breakfast-type foods are often better tolerated so eat foods such as eggs, pancakes, waffles and cereal for any meal or snack. Carry snacks with you so you are prepared to eat every 2 to 3 hours. Determine what works  best for you if your bodys cues for feeling hungry or full are not working. Eat a small meal or snack even if you dont feel hungry. Set a timer to remind you when it is time to eat. Take a walk before you eat (with health care providers approval). Light or moderate physical activity can help you maintain muscle and increase your  appetite.  Make Eating Enjoyable  Taking steps to make the experience enjoyable may help to increase your interest in eating and improve your appetite.  Strategies:  Eat with others whenever possible. Include your favorite foods to make meals more enjoyable. Try new foods. Save your beverage for the end of the meal so that you have more room for food before you get full.  Add Calories to Your Meals and Snacks  Try adding calorie-dense foods so that each bite provides more nutrition.  Strategies  Drink milk, chocolate milk, soy milk, or smoothies instead of low-calorie beverages such as diet drinks or water. Cook with milk or soy milk instead of water when making dishes such as hot cereal, cocoa, or pudding. Add jelly, jam, honey, butter or margarine to bread and crackers. Add jam or fruit to ice cream and as a topping over cake. Mix dried fruit, nuts, granola, honey, or dry cereal with yogurt or hot cereals. Enjoy snacks such as milkshakes, smoothies, pudding, ice cream, or custard. Blend a fruit smoothie of a banana, frozen berries, milk or soy milk, and 1 tablespoon nonfat powdered milk or protein powder.  Add Protein to Your Meals and Snacks  Choose at least one protein food at each meal and snack to increase your daily intake.  Strategies  Add  cup nonfat dry milk powder or protein powder to make a high-protein milk to drink or to use in recipes that call for milk. Vanilla or peppermint extract or unsweetened cocoa powder could help to boost the flavor. Add hard-cooked eggs, leftover meat, grated cheese, canned beans or tofu to noodles, rice, salads, sandwiches, soups, casseroles, pasta, tuna and other mixed dishes. Add powdered milk or protein powder to hot cereals, meatloaf, casseroles, scrambled eggs, sauces, cream soups, and shakes. Add beans and lentils to salads, soups, casseroles, and vegetable dishes. Eat cottage cheese or yogurt, especially Greek yogurt, with fruit  as a snack or dessert. Eat peanut or other nut butters on crackers, bread, toast, waffles, apples, bananas or celery sticks. Add it to milkshakes, smoothies, or desserts. Consider a ready-made protein shake.   Add Fats to Your Meals and Snacks  Try adding fats to your meals and snacks. Fat provides more calories in fewer bites than carbohydrate or protein and adds flavors to your foods.  Strategies  Snack on nuts and seeds or add them to foods like salads, pasta, cereals, yogurt, and ice cream.  Saut or stir-fry vegetables, meats, chicken, fish or tofu in olive or canola oil.  Add olive oil, other vegetable oils, butter or margarine to soups, vegetables, potatoes, cooked cereal, rice, pasta, bread, crackers, pancakes, or waffles. Snack on olives or add to pasta, pizza, or salad. Add avocado or guacamole to your salads, sandwiches, and other entrees. Include fatty fish such as salmon in your weekly meal plan.  Tips For Adding Protein Nutrition Therapy    Tips Add extra egg to one or more meals  Increase the portion of milk to drink and change to skim milk if able  Include [place] yogurt or cottage cheese for snack or part of a meal  Increase portion size of protein entre and decrease portion of starch/bread  Mix protein powder, nut butter, almond/nut milk, non-fat dry milk, or Greek yogurt to shakes and smoothies  Use these ingredients also in baked goods or other recipes Use double the amount of sandwich filling  Add protein foods to all snacks including cheese, nut butters, milk and yogurt  Food Tips for Including Protein  Beans Cook and use dried peas, beans, and tofu in soups or add to casseroles, pastas, and grain dishes that also contain cheese or meat  Mash with cheese and milk  Use tofu to make smoothies  Commercial Protein Supplements Use nutritional supplements or protein powder sold at pharmacies and grocery stores  Use protein powder in milk drinks and desserts, such as  pudding  Mix with ice cream, milk, and fruit or other flavorings for a high-protein milkshake  Cottage Cheese or [employer] Mix with or use to stuff fruits and vegetables  Add to casseroles, spaghetti, noodles, or egg dishes such as omelets, scrambled eggs, and souffls  Use gelatin, pudding-type desserts, cheesecake, and pancake or waffle batter  Use to stuff crepes, pasta shells, or manicotti  Puree and use as a substitute for sour cream  Eggs, Egg whites, and Egg Yolks Add chopped, hard-cooked eggs to salads and dressings, vegetables, casseroles, and creamed meats  Beat eggs into mashed potatoes, vegetable purees, and sauces  Add extra egg whites to quiches, scrambled eggs, custards, puddings, pancake batter, or [place] toast wash/batter  Make a rich custard with egg yolks, double strength milk, and sugar  Add extra hard-cooked yolks to deviled egg filling and sandwich spreads  Hard or Semi-Soft Cheese (Cheddar, Barnabas Lister, [place]) Melt on sandwiches, bread, muffins, tortillas, hamburgers, hot dogs, other meats or fish, vegetables, eggs, or desserts such as stewed figs or pies  Grate and add to soups, sauces, casseroles, vegetable dishes, potatoes, rice noodles, or meatloaf  Serve as a snack with crackers or bagels  Ice cream, Yogurt, and Frozen Yogurt Add to milk drinks such as milkshakes  Add to cereals, fruits, gelatin desserts, and pies  Blend or whip with soft or cooked fruits  Sandwich ice cream or frozen yogurt between enriched cake slices, cookies, or graham crackers  Use seasoned yogurt as a dip for fruits, vegetables, or chips  Use yogurt in place of sour cream in casseroles  Meat and Fish Add chopped, cooked meat or fish to vegetables, salads, casseroles, soups, sauces, and biscuit dough  Use in omelets, souffls, quiches, and sandwich fillings  Add chicken and [place] to stuffing  Wrap in pie crust or biscuit dough as turnovers  Add to stuffed baked potatoes  Add pureed meat to  soups  Milk Use in beverages and in cooking  Use in preparing foods, such as hot cereal, soups, cocoa, or pudding  Add cream sauces to vegetable and other dishes  Use evaporated milk, evaporated skim milk, or sweetened condensed milk instead of milk or water in recipes.  Nonfat Dry Milk Add 1/3 cup of nonfat dry milk powdered milk to each cup of regular milk for double strength milk  Add to yogurt and milk drinks, such as pasteurized eggnog and milkshakes  Add to scrambled eggs and mashed potatoes  Use in casseroles, meatloaf, hot cereal, breads, muffins, sauces, cream soups, puddings and custards, and other milk-based desserts  Nuts, Seeds, and Wheat Germ Add to casseroles, breads, muffins, pancakes, cookies, and waffles  Sprinkle on fruit, cereal, ice cream, yogurt, vegetables, salads,  and toast as a crunchy topping  Use in place of breadcrumbs  Blend with parsley or spinach, herbs, and cream for a noodle, pasta, or vegetable sauce.  Roll banana in chopped nuts  Peanut Butter Spread on sandwiches, toast, muffins, crackers, waffles, pancakes, and fruit slices  Use as a dip for raw vegetables, such as carrots, cauliflower, and celery  Blend with milk drinks, smoothies, and other beverages  Swirl through soft ice cream or yogurt  Spread on a banana then roll in crushed, dry cereal or chopped nuts   Small Meal and Snack Ideas  These snacks and meals are recommended when you have to eat but arent necessarily hungry.  They are good choices because they are high in protein and high in calories.   2 graham crackers, 2 tablespoons peanut or other nut butter, 1 cup milk  cup [place] yogurt,  cup fruit,  cup granola 2 deviled egg halves, 5 whole wheat crackers 1 cup cream of tomato soup,  grilled cheese sandwich 1 toasted waffle topped with: 2 tablespoons peanut or nut butter, 1 tablespoon jam Trail mix made with:  cup nuts,  cup dried fruit,  cup cold cereal, any variety  cup oatmeal  or cream of wheat cereal, 1 tablespoon peanut or nut butter,  cup diced fruit  High-Calorie, High-Protein Sample 1-Day Menu   Breakfast 1 egg, scrambled 1 ounce cheddar cheese 1 English muffin, whole wheat 1 tablespoon margarine 1 tablespoon jam  cup orange juice, fortified with calcium and vitamin D  Morning Snack 1 tablespoon peanut butter 1 banana 1 cup 1% milk  Lunch Tuna salad sandwich made with: 2 slices bread, whole wheat 3 ounces tuna mixed with: 1 tablespoon mayonnaise  cup pudding  Afternoon Snack  cup hummus  cup carrots 1 pita  Evening Meal Enchilada casserole made with: 2 corn tortillas 3 ounces ground beef, cooked  cup black beans, cooked  cup corn, cooked 1 ounce grated cheddar cheese  cup enchilada sauce  avocado, sliced, topping for enchilada 1 tablespoon sour cream, topping for enchilada Salad:  cup lettuce, shredded  cup tomatoes, chopped, for salad 1 tablespoon olive oil and vinegar dressing, for salad  Evening Snack  cup Greek yogurt  cup blueberries  cup granola  Copyright 2020  [employer]. All rights reserved

## 2021-08-29 NOTE — Progress Notes (Signed)
Per [employer], patient converted to a-fib around 0500.  Night coverage made aware.

## 2021-08-29 NOTE — Progress Notes (Signed)
PHARMACY NOTE -  Cefepime  Pharmacy has been assisting with dosing of cefepime for sepsis. Will adjust dose to 2g IV q8 hr with improved renal function; further renal adjustments per institutional Pharmacy antibiotic protocol  Pharmacy will sign off, following peripherally for culture results or dose adjustments. Please reconsult if a change in clinical status warrants re-evaluation of dosage.  Reuel Boom, PharmD, BCPS [phone] 08/29/2021, 2:35 PM

## 2021-08-30 ENCOUNTER — Telehealth: Payer: Self-pay

## 2021-08-30 DIAGNOSIS — I480 Paroxysmal atrial fibrillation: Secondary | ICD-10-CM | POA: Diagnosis not present

## 2021-08-30 LAB — CBC WITH DIFFERENTIAL/PLATELET
Abs Immature Granulocytes: 0.08 10*3/uL — ABNORMAL HIGH (ref 0.00–0.07)
Basophils Absolute: 0 10*3/uL (ref 0.0–0.1)
Basophils Relative: 0 %
Eosinophils Absolute: 0.1 10*3/uL (ref 0.0–0.5)
Eosinophils Relative: 1 %
HCT: 32.2 % — ABNORMAL LOW (ref 39.0–52.0)
Hemoglobin: 10.6 g/dL — ABNORMAL LOW (ref 13.0–17.0)
Immature Granulocytes: 1 %
Lymphocytes Relative: 23 %
Lymphs Abs: 1.8 10*3/uL (ref 0.7–4.0)
MCH: 27.2 pg (ref 26.0–34.0)
MCHC: 32.9 g/dL (ref 30.0–36.0)
MCV: 82.6 fL (ref 80.0–100.0)
Monocytes Absolute: 0 10*3/uL — ABNORMAL LOW (ref 0.1–1.0)
Monocytes Relative: 0 %
Neutro Abs: 5.8 10*3/uL (ref 1.7–7.7)
Neutrophils Relative %: 75 %
Platelets: 94 10*3/uL — ABNORMAL LOW (ref 150–400)
RBC: 3.9 MIL/uL — ABNORMAL LOW (ref 4.22–5.81)
RDW: 14.5 % (ref 11.5–15.5)
Smear Review: DECREASED
WBC: 7.8 10*3/uL (ref 4.0–10.5)
nRBC: 0 % (ref 0.0–0.2)

## 2021-08-30 LAB — BASIC METABOLIC PANEL
Anion gap: 6 (ref 5–15)
BUN: 13 mg/dL (ref 8–23)
CO2: 26 mmol/L (ref 22–32)
Calcium: 8 mg/dL — ABNORMAL LOW (ref 8.9–10.3)
Chloride: 104 mmol/L (ref 98–111)
Creatinine, Ser: 1.02 mg/dL (ref 0.61–1.24)
GFR, Estimated: >60 mL/min (ref >60)
Glucose, Bld: 128 mg/dL — ABNORMAL HIGH (ref 70–99)
Potassium: 4.4 mmol/L (ref 3.5–5.1)
Sodium: 136 mmol/L (ref 135–145)

## 2021-08-30 LAB — URINE CULTURE: Culture: 1000 — AB

## 2021-08-30 LAB — PROCALCITONIN: Procalcitonin: 0.52 ng/mL

## 2021-08-30 MED ORDER — SODIUM CHLORIDE 0.9 % IV SOLN
2.0000 g | INTRAVENOUS | Status: DC
  Administered 2021-08-30: 2 g via INTRAVENOUS
  Filled 2021-08-30 (×3): qty 20

## 2021-08-30 MED ORDER — ENSURE ENLIVE PO LIQD
237.0000 mL | Freq: Two times a day (BID) | ORAL | Status: DC
  Administered 2021-08-30 – 2021-08-31 (×2): 237 mL via ORAL

## 2021-08-30 NOTE — Progress Notes (Signed)
Angel Keller   DOB:December 13, 1940   IH#:[id number]   LKJ#:[id number]  Oncology follow-up note  Subjective: Patient is well-known to me, under my care for his pancreatic cancer.  He is on neoadjuvant chemotherapy, status post 2 doses of chemo in the past 2 weeks.  He presented to hospital with fever and fatigue on 1/14, one day after last dose chemo. He is feeling better today, sitting in recliner when I saw him today.    Objective:  Vitals:   08/30/21 1600 08/30/21 1754  BP: (!) 117/92 140/63  Pulse: 70 78  Resp: (!) 24 18  Temp:  (!) 97.5 F (36.4 C)  SpO2: 97% 98%    Body mass index is 22.89 kg/m.  Intake/Output Summary (Last 24 hours) at 08/30/2021 2117 Last data filed at 08/30/2021 1700 Gross per 24 hour  Intake 462.39 ml  Output 1250 ml  Net -787.61 ml     Sclerae unicteric  Oropharynx clear  No peripheral adenopathy  Lungs clear -- no rales or rhonchi  Heart regular rate and rhythm  Abdomen soft, mild epigastric tenderness  MSK no focal spinal tenderness, no peripheral edema  Neuro nonfocal    CBG (last 3)  No results for input(s): GLUCAP in the last 72 hours.   Labs:  Urine Studies No results for input(s): UHGB, CRYS in the last 72 hours.  Invalid input(s): UACOL, UAPR, USPG, UPH, UTP, UGL, UKET, UBIL, UNIT, UROB, ULEU, UEPI, UWBC, [place], [place]  Basic Metabolic Panel: Recent Labs  Lab 08/27/21 1049 08/27/21 2206 08/28/21 0500 08/28/21 1323 08/30/21 0254  NA 132* 131* 136  --  136  K 3.9 3.3* 3.5  --  4.4  CL 95* 97* 106  --  104  CO2 '28 25 25  ' --  26  GLUCOSE 108* 145* 125*  --  128*  BUN 24* 30* 23  --  13  CREATININE 1.21 1.10 0.94  --  1.02  CALCIUM 9.2 8.4* 7.5*  --  8.0*  MG  --   --   --  2.1  --    GFR Estimated Creatinine Clearance: 56.5 mL/min (by C-G formula based on SCr of 1.02 mg/dL). Liver Function Tests: Recent Labs  Lab 08/27/21 1049 08/27/21 2206  AST 36 37  ALT 38 38  ALKPHOS 128* 112  BILITOT 2.2* 2.2*   PROT 7.2 6.6  ALBUMIN 3.7 3.1*   No results for input(s): LIPASE, AMYLASE in the last 168 hours. No results for input(s): AMMONIA in the last 168 hours. Coagulation profile Recent Labs  Lab 08/27/21 2206  INR 1.3*    CBC: Recent Labs  Lab 08/27/21 1049 08/27/21 2206 08/28/21 0500 08/30/21 0254  WBC 4.7 4.0 4.8 7.8  NEUTROABS 2.6 3.1  --  5.8  HGB 11.3* 11.3* 9.6* 10.6*  HCT 32.9* 33.5* 28.7* 32.2*  MCV 78.7* 80.3 82.0 82.6  PLT 175 141* 109* 94*   Cardiac Enzymes: No results for input(s): CKTOTAL, CKMB, CKMBINDEX, TROPONINI in the last 168 hours. BNP: Invalid input(s): POCBNP CBG: No results for input(s): GLUCAP in the last 168 hours. D-Dimer No results for input(s): DDIMER in the last 72 hours. Hgb A1c No results for input(s): HGBA1C in the last 72 hours. Lipid Profile No results for input(s): CHOL, HDL, LDLCALC, TRIG, CHOLHDL, LDLDIRECT in the last 72 hours. Thyroid function studies No results for input(s): TSH, T4TOTAL, T3FREE, THYROIDAB in the last 72 hours.  Invalid input(s): FREET3 Anemia work up No results for  input(s): VITAMINB12, FOLATE, FERRITIN, TIBC, IRON, RETICCTPCT in the last 72 hours. Microbiology Recent Results (from the past 240 hour(s))  Blood Culture (routine x 2)     Status: Abnormal (Preliminary result)   Collection Time: 08/27/21 10:06 PM   Specimen: BLOOD  Result Value Ref Range Status   Specimen Description   Final    BLOOD LEFT ANTECUBITAL Performed at [facility] [address], [place], [place] [address]    Special Requests   Final    BOTTLES DRAWN AEROBIC AND ANAEROBIC Blood Culture adequate volume Performed at [facility] [address], [place], [place] [address]    Culture  Setup Time   Final    GRAM POSITIVE COCCI IN CHAINS IN BOTH AEROBIC AND ANAEROBIC BOTTLES CRITICAL RESULT CALLED TO, READ BACK BY AND VERIFIED WITH: T,GREEN PHARMD '@1632'  08/28/21 EB    Culture (A)  Final     STREPTOCOCCUS MITIS/ORALIS CULTURE REINCUBATED FOR BETTER GROWTH THE SIGNIFICANCE OF ISOLATING THIS ORGANISM FROM A SINGLE SET OF BLOOD CULTURES WHEN MULTIPLE SETS ARE DRAWN IS UNCERTAIN. PLEASE NOTIFY THE MICROBIOLOGY DEPARTMENT WITHIN ONE WEEK IF SPECIATION AND SENSITIVITIES ARE REQUIRED. Performed at [facility], [place] [address], [place], [place] [address]    Report Status PENDING  Incomplete  Urine Culture     Status: Abnormal   Collection Time: 08/27/21 10:06 PM   Specimen: In/Out Cath Urine  Result Value Ref Range Status   Specimen Description   Final    IN/OUT CATH URINE Performed at [facility] [address] [place], [place] [address]    Special Requests   Final    NONE Performed at [facility], [place] [address] [place], [place] [address]    Culture (A)  Final    1,000 COLONIES/mL ENTEROBACTER CLOACAE 1,000 COLONIES/mL STAPHYLOCOCCUS EPIDERMIDIS    Report Status 08/30/2021 FINAL  Final   Organism ID, Bacteria ENTEROBACTER CLOACAE (A)  Final   Organism ID, Bacteria STAPHYLOCOCCUS EPIDERMIDIS (A)  Final      Susceptibility   Enterobacter cloacae - MIC*    CEFAZOLIN >=64 RESISTANT Resistant     CEFEPIME <=0.12 SENSITIVE Sensitive     CIPROFLOXACIN <=0.25 SENSITIVE Sensitive     GENTAMICIN <=1 SENSITIVE Sensitive     IMIPENEM <=0.25 SENSITIVE Sensitive     NITROFURANTOIN 64 INTERMEDIATE Intermediate     TRIMETH/SULFA <=20 SENSITIVE Sensitive     PIP/TAZO <=4 SENSITIVE Sensitive     * 1,000 COLONIES/mL ENTEROBACTER CLOACAE   Staphylococcus epidermidis - MIC*    CIPROFLOXACIN <=0.5 SENSITIVE Sensitive     GENTAMICIN <=0.5 SENSITIVE Sensitive     NITROFURANTOIN <=16 SENSITIVE Sensitive     OXACILLIN >=4 RESISTANT Resistant     TETRACYCLINE 2 SENSITIVE Sensitive     VANCOMYCIN 2 SENSITIVE Sensitive     TRIMETH/SULFA <=10 SENSITIVE Sensitive     CLINDAMYCIN <=0.25 SENSITIVE Sensitive     RIFAMPIN <=0.5 SENSITIVE Sensitive      Inducible Clindamycin NEGATIVE Sensitive     * 1,000 COLONIES/mL STAPHYLOCOCCUS EPIDERMIDIS  Blood Culture ID Panel (Reflexed)     Status: Abnormal   Collection Time: 08/27/21 10:06 PM  Result Value Ref Range Status   Enterococcus faecalis NOT DETECTED NOT DETECTED Final   Enterococcus Faecium NOT DETECTED NOT DETECTED Final   Listeria monocytogenes NOT DETECTED NOT DETECTED Final   Staphylococcus species NOT DETECTED NOT DETECTED Final   Staphylococcus aureus (BCID) NOT DETECTED NOT DETECTED Final  Staphylococcus epidermidis NOT DETECTED NOT DETECTED Final   Staphylococcus lugdunensis NOT DETECTED NOT DETECTED Final   Streptococcus species DETECTED (A) NOT DETECTED Final    Comment: Not Enterococcus species, Streptococcus agalactiae, Streptococcus pyogenes, or Streptococcus pneumoniae. CRITICAL RESULT CALLED TO, READ BACK BY AND VERIFIED WITH: T,GREEN PHARMD '@1632'  08/28/21 EB    Streptococcus agalactiae NOT DETECTED NOT DETECTED Final   Streptococcus pneumoniae NOT DETECTED NOT DETECTED Final   Streptococcus pyogenes NOT DETECTED NOT DETECTED Final   A.calcoaceticus-baumannii NOT DETECTED NOT DETECTED Final   Bacteroides fragilis NOT DETECTED NOT DETECTED Final   Enterobacterales NOT DETECTED NOT DETECTED Final   Enterobacter cloacae complex NOT DETECTED NOT DETECTED Final   Escherichia coli NOT DETECTED NOT DETECTED Final   Klebsiella aerogenes NOT DETECTED NOT DETECTED Final   Klebsiella oxytoca NOT DETECTED NOT DETECTED Final   Klebsiella pneumoniae NOT DETECTED NOT DETECTED Final   Proteus species NOT DETECTED NOT DETECTED Final   Salmonella species NOT DETECTED NOT DETECTED Final   Serratia marcescens NOT DETECTED NOT DETECTED Final   Haemophilus influenzae NOT DETECTED NOT DETECTED Final   Neisseria meningitidis NOT DETECTED NOT DETECTED Final   Pseudomonas aeruginosa NOT DETECTED NOT DETECTED Final   Stenotrophomonas maltophilia NOT DETECTED NOT DETECTED Final   Candida  albicans NOT DETECTED NOT DETECTED Final   Candida auris NOT DETECTED NOT DETECTED Final   Candida glabrata NOT DETECTED NOT DETECTED Final   Candida krusei NOT DETECTED NOT DETECTED Final   Candida parapsilosis NOT DETECTED NOT DETECTED Final   Candida tropicalis NOT DETECTED NOT DETECTED Final   Cryptococcus neoformans/gattii NOT DETECTED NOT DETECTED Final    Comment: Performed at [facility], [address], [place], [place] [address]  Resp Panel by RT-PCR (Flu A&B, Covid) Nasopharyngeal Swab     Status: None   Collection Time: 08/27/21 10:07 PM   Specimen: Nasopharyngeal Swab; Nasopharyngeal(NP) swabs in vial transport medium  Result Value Ref Range Status   SARS Coronavirus 2 by RT PCR NEGATIVE NEGATIVE Final    Comment: (NOTE) SARS-CoV-2 target nucleic acids are NOT DETECTED.  The SARS-CoV-2 RNA is generally detectable in upper respiratory specimens during the acute phase of infection. The lowest concentration of SARS-CoV-2 viral copies this assay can detect is 138 copies/mL. A negative result does not preclude SARS-Cov-2 infection and should not be used as the sole basis for treatment or other patient management decisions. A negative result may occur with  improper specimen collection/handling, submission of specimen other than nasopharyngeal swab, presence of viral mutation(s) within the areas targeted by this assay, and inadequate number of viral copies(<138 copies/mL). A negative result must be combined with clinical observations, patient history, and epidemiological information. The expected result is Negative.  Fact Sheet for Patients:  EntrepreneurPulse.com.au  Fact Sheet for Healthcare Providers:  IncredibleEmployment.be  This test is no t yet approved or cleared by the [place] FDA and  has been authorized for detection and/or diagnosis of SARS-CoV-2 by FDA under an Emergency Use Authorization (EUA). This EUA will  remain  in effect (meaning this test can be used) for the duration of the COVID-19 declaration under Section 564(b)(1) of the Act, 21 U.S.C.section 360bbb-3(b)(1), unless the authorization is terminated  or revoked sooner.       Influenza A by PCR NEGATIVE NEGATIVE Final   Influenza B by PCR NEGATIVE NEGATIVE Final    Comment: (NOTE) The Xpert Xpress SARS-CoV-2/FLU/RSV plus assay is intended as an aid in the diagnosis of influenza  from Nasopharyngeal swab specimens and should not be used as a sole basis for treatment. Nasal washings and aspirates are unacceptable for Xpert Xpress SARS-CoV-2/FLU/RSV testing.  Fact Sheet for Patients: EntrepreneurPulse.com.au  Fact Sheet for Healthcare Providers: IncredibleEmployment.be  This test is not yet approved or cleared by the [place] FDA and has been authorized for detection and/or diagnosis of SARS-CoV-2 by FDA under an Emergency Use Authorization (EUA). This EUA will remain in effect (meaning this test can be used) for the duration of the COVID-19 declaration under Section 564(b)(1) of the Act, 21 U.S.C. section 360bbb-3(b)(1), unless the authorization is terminated or revoked.  Performed at [facility], [place] [address], [place], [place] [address]   Blood Culture (routine x 2)     Status: None (Preliminary result)   Collection Time: 08/27/21 10:11 PM   Specimen: BLOOD  Result Value Ref Range Status   Specimen Description   Final    BLOOD BLOOD LEFT FOREARM Performed at [facility] [address] [place], [place] [address]    Special Requests   Final    BOTTLES DRAWN AEROBIC AND ANAEROBIC Blood Culture adequate volume Performed at [facility] [address] [place], [place] [address]    Culture   Final    NO GROWTH 2 DAYS Performed at [facility] [address], [place], [place] [address]    Report Status PENDING   Incomplete  MRSA Next Gen by PCR, Nasal     Status: None   Collection Time: 08/28/21  8:17 PM   Specimen: Nasal Mucosa; Nasal Swab  Result Value Ref Range Status   MRSA by PCR Next Gen NOT DETECTED NOT DETECTED Final    Comment: (NOTE) The GeneXpert MRSA Assay (FDA approved for NASAL specimens only), is one component of a comprehensive MRSA colonization surveillance program. It is not intended to diagnose MRSA infection nor to guide or monitor treatment for MRSA infections. Test performance is not FDA approved in patients less than 79 years old. Performed at [facility], [place] [address], [place], [place] [address]   Culture, blood (routine x 2)     Status: None (Preliminary result)   Collection Time: 08/29/21  2:39 PM   Specimen: BLOOD  Result Value Ref Range Status   Specimen Description   Final    BLOOD RIGHT ANTECUBITAL Performed at [facility] [address], [place], [place] [address]    Special Requests   Final    BOTTLES DRAWN AEROBIC ONLY Blood Culture adequate volume Performed at [facility] [address], [place], [place] [address]    Culture   Final    NO GROWTH < 24 HOURS Performed at [facility] [address], [place], [place] [address]    Report Status PENDING  Incomplete  Culture, blood (routine x 2)     Status: None (Preliminary result)   Collection Time: 08/29/21  2:39 PM   Specimen: BLOOD  Result Value Ref Range Status   Specimen Description   Final    BLOOD RIGHT ANTECUBITAL Performed at [facility] [address], [place], [place] [address]    Special Requests   Final    BOTTLES DRAWN AEROBIC AND ANAEROBIC Blood Culture adequate volume Performed at [facility] [address], [place], [place] [address]    Culture   Final    NO GROWTH < 24 HOURS Performed at [facility], [place] [address] [place], [place] [address]    Report Status  PENDING  Incomplete      Studies:  No results found.  Assessment: 81 y.o. male   Fever, strep oralis bacteria in 1/2 blood culture  Hypotension from dehydration and sepsis, resolved now  AF with RVR Pancreatic cancer, on neoadjuvant chemo  Obstructive jaundice, s/p PTC which is capped now   Plan:  -IV antibiotics per primary team, ID consult has been called but has not seen pt  -I will call IR for them to check his PTC to see if we need to connect it with a bag for draining due to persistent slightly elevated tbil  -He is not tolerating chemo well, I will f/u closely after his hospital discharge  Truitt Merle  08/30/2021    Truitt Merle, MD 08/30/2021  9:17 PM

## 2021-08-30 NOTE — Telephone Encounter (Signed)
Dr. Truitt Merle is aware this pt has been admitted to [facility] since 08/27/2021.

## 2021-08-30 NOTE — Progress Notes (Signed)
PROGRESS NOTE  Angel Keller ZOX:[id number] DOB: 29-Sep-1940 DOA: 08/27/2021 PCP: Ronnell Freshwater, NP  Brief History   The patient is an 81 yr old man who was admitted earlier this morning by my colleague, Dr. Tonie Griffith. He presented to [facility] on 06/28/2022 after a fever that began on the night of 08/27/2021. The patient has pancreatic cancer and had chemotherapy Gemzar as treatment prior to developing fever. When he arrived in the ED he was hypotensive with BP 80/40 which responded to IV fluids. No source of infection was found, but he was started on broad spectrum IV antibiotics.    [facility] were consulted to admit the patient for further evaluation and care.  He is receiving cefepime for treatment of unknown infectious source. He converted to atrial fibrillation at 0500 this morning. HR 99-134 this morning. BP too low for diltiazem or orther rate limiting med. Patient is asymptomatic. He has been started on Eliquis.  1/2 Blood cultures has grown out strep oralis. Blood cultures have been repeated. Surveillance cultures have had no growth.  He states that he is feeling better today.  Consultants    Procedures    Antibiotics   Anti-infectives (From admission, onward)    Start     Dose/Rate Route Frequency Ordered Stop   08/30/21 1800  cefTRIAXone (ROCEPHIN) 2 g in sodium chloride 0.9 % 100 mL IVPB        2 g 200 mL/hr over 30 Minutes Intravenous Every 24 hours 08/30/21 1036 09/04/21 2359   08/29/21 2200  ceFEPIme (MAXIPIME) 2 g in sodium chloride 0.9 % 100 mL IVPB  Status:  Discontinued        2 g 200 mL/hr over 30 Minutes Intravenous Every 8 hours 08/29/21 1435 08/30/21 1036   08/28/21 1400  ceFEPIme (MAXIPIME) 2 g in sodium chloride 0.9 % 100 mL IVPB  Status:  Discontinued        2 g 200 mL/hr over 30 Minutes Intravenous Every 12 hours 08/28/21 0151 08/28/21 0410   08/28/21 1400  ceFEPIme (MAXIPIME) 2 g in sodium chloride 0.9 % 100 mL IVPB  Status:  Discontinued         2 g 200 mL/hr over 30 Minutes Intravenous Every 12 hours 08/28/21 0410 08/29/21 1435   08/28/21 0100  ceFEPIme (MAXIPIME) 2 g in sodium chloride 0.9 % 100 mL IVPB        2 g 200 mL/hr over 30 Minutes Intravenous  Once 08/28/21 0046 08/28/21 1201       Subjective  The patient states that he is feeling better today. No new complaints.   Objective   Vitals:  Vitals:   08/30/21 1100 08/30/21 1200  BP: 118/61 127/67  Pulse: 69 68  Resp: (!) 21 17  Temp:  97.9 F (36.6 C)  SpO2: 97% 98%    Exam:  Constitutional:  The patient is awake, alert, and oriented x 3. No acute distress. Respiratory:  No increased work of breathing. No wheezes, rales, or rhonchi No tactile fremitus Cardiovascular:  Irregular rate and rhythm No murmurs, ectopy, or gallups. No lateral PMI. No thrills. Abdomen:  Abdomen is soft, non-tender, non-distended No hernias, masses, or organomegaly Normoactive bowel sounds.  Musculoskeletal:  No cyanosis, clubbing, or edema Skin:  No rashes, lesions, ulcers palpation of skin: no induration or nodules Neurologic:  CN 2-12 intact Sensation all 4 extremities intact Psychiatric:  Mental status Mood, affect appropriate Orientation to person, place, time  judgment and insight appear intact  I have personally reviewed the following:   Today's Data  Vitals  Lab Data  CBC, BMP  Micro Data  1/2 Blood cultures from 1/13 has grown out strep oralis. Second set drawn 08/29/2021 has not had any gowth.  Imaging  CTA chest  Cardiology Data  EKG 08/29/2021 EKG 08/27/2021 Echocardiogram  Other Data    Scheduled Meds:  apixaban  5 mg Oral BID   aspirin EC  81 mg Oral Daily   Chlorhexidine Gluconate Cloth  6 each Topical Daily   diltiazem  30 mg Oral Q8H   feeding supplement  237 mL Oral BID BM   levothyroxine  112 mcg Oral Q0600   pantoprazole  40 mg Oral Daily   sodium chloride flush  10-40 mL Intracatheter Q12H   Continuous Infusions:   cefTRIAXone (ROCEPHIN)  IV      Principal Problem:   AF (paroxysmal atrial fibrillation) (HCC) Active Problems:   Pancreatic cancer (HCC)   SIRS (systemic inflammatory response syndrome) (HCC)   Hypotension   Hypokalemia   Hyponatremia   Fever   Prolonged QT interval   LOS: 2 days   A & P  AF (paroxysmal atrial fibrillation): Angel Keller is admitted to stepdown unit with a-fib and SIRS.  Started on eliquis for anticoagulation with a-fib. Echocardiogram has demonstrated EF of 60-65%, normal LV function, no regional wall motion abnormalities. There was Grade 1 diastolic dysfunction. He has normal RV size and function. Normal pulmonary artery systolic pressure. Right and left atria are dilated. Tricuspid tricuspid valve. Sclerosis/calcification is present. Heart rate is now controlled on diltiazem 30 mg qid.  Bacteremia: 1/2 blood cultures collected on 08/27/2021 grew out strep oralis. Pt is on cefepime. Surveillance cultures have had no growth. ID has been consulted.    Hypotension Resolved. While in the ER the BP dropped to 80/40 range. Was given IVF bolus which brought BP up to 108/60 range. BP improved on 08/28/2021, but SBP in the 70's on 1/15. 1L bolus given. Pt is in Atrial fibrillation. HR in the 70's. Will continue to monitor BP.   SIRS (systemic inflammatory response syndrome): Continued fever and hypotension. 1/2 blood cultures from 08/27/2021 positive for strep oralis. Blood cultures repeated. Check CTA chest was without acute acute pathology. No PE.  Pt has risk of PE with pancreatic cancer   Hypokalemia: Resolved.   Hyponatremia: Resolved.    Prolonged QT interval: Resolved. Avoid medications that can further prolong QT interval   Pancreatic cancer: Biopsied on 08/05/2021. Pancreatic protocol CT demonstrated 3/6x 2.6 cm pancreatic head mass that was judged to be borderline resectable. Dr. Zenia Resides has evaluated the patient and recommended neoadjuvant therapy. He had one dose of  gemcitabine alone on 08/20/2021 and one dose on 08/27/2021 with C1D8. The plan was to add Abraxane on the next cycle if his LFT's improves. Followed by Dr. Burr Medico of oncology.   I have seen and examined this patient myself. I have spent 32 minutes in his evaluation and care.   DVT prophylaxis:      Placed on Eliquis as now in A-fib  Code Status:              Full Code  Family Communication:       Diagnosis and plan discussed with patient.  Patient verbally understanding agrees with plan.  Further recommendation follow as clinical indicated Disposition Plan:              Patient is from:  Home             Anticipated DC to:                   Home             Anticipated DC date:               Anticipate 2 midnight or more stay in the hospital             Atasha Colebank, DO [facility] Direct contact: see [email or web address]  7PM-7AM contact night coverage as above 08/30/2021, 3:53 PM  LOS: 1 day

## 2021-08-30 NOTE — Consult Note (Signed)
[facility]       Reason for Consult:bactreremia    Referring Physician: Dr. Benny Lennert  Principal Problem:   AF (paroxysmal atrial fibrillation) ([place]) Active Problems:   Pancreatic cancer (HCC)   SIRS (systemic inflammatory response syndrome) (HCC)   Hypotension   Hypokalemia   Hyponatremia   Fever   Prolonged QT interval    apixaban  5 mg Oral BID   aspirin EC  81 mg Oral Daily   Chlorhexidine Gluconate Cloth  6 each Topical Daily   diltiazem  30 mg Oral Q8H   feeding supplement  237 mL Oral BID BM   levothyroxine  112 mcg Oral Q0600   pantoprazole  40 mg Oral Daily   sodium chloride flush  10-40 mL Intracatheter Q12H    Recommendations: Ceftriaxone for 7 days  Assessment: He has one positive set of blood cultures with Strep mitis in the setting of fever, now resolved.  Repeat cultures sent.  As long as repeat cultures remain negative, no further work up indicated.    Antibiotics: Cefepime x 3 days  HPI: Angel Keller. is a 81 y.o. male with pancreatic cancer on chemotherapy came in with fever after recent chemo.  He was admitted to the ICU with hypotension and fluid resuscitated and noted to be in afib.  Feels better since admission.  Positive culture as above.  Port-a-cath without issues.  Some chills.   He has had no new shortness of breath, no diarrhea, no rash or other concerns.   Review of Systems:  Constitutional: negative for anorexia Gastrointestinal: negative for nausea and diarrhea Integument/breast: negative for rash All other systems reviewed and are negative    Past Medical History:  Diagnosis Date   Aneurysm ([place])    2017   Anxiety    Arthritis    Depression    Headache    HLD (hyperlipidemia)    Hx of inguinal hernia repair    Hypertension    Hypothyroidism    Overactive bladder    Pancreatic cancer (HCC)    Pneumonia    Shingles    Sleep apnea    Patient denies   Thoracic ascending aortic aneurysm     mildly dilated 4.0cm per 08/12/21 CT   Thyroid disease    Varicose veins of both lower extremities     Social History   Tobacco Use   Smoking status: Former    Packs/day: 0.25    Years: 1.00    Pack years: 0.25    Types: Cigarettes    Quit date: 08/15/1961    Years since quitting: 60.0   Smokeless tobacco: Never  Vaping Use   Vaping Use: Never used  Substance Use Topics   Alcohol use: No   Drug use: No    Family History  Problem Relation Age of Onset   Breast cancer Mother    Lung cancer Father    Alcoholism Sister    Cirrhosis Sister    Colon cancer Neg Hx    Esophageal cancer Neg Hx    Liver cancer Neg Hx    Pancreatic cancer Neg Hx    Prostate cancer Neg Hx     No Known Allergies  Physical Exam: Constitutional: in no apparent distress  Vitals:   08/30/21 1100 08/30/21 1200  BP: 118/61 127/67  Pulse: 69 68  Resp: (!) 21 17  Temp:  97.9 F (36.6 C)  SpO2: 97% 98%   EYES: anicteric ENMT:no thrush  Cardiovascular: irr irr Respiratory: normal respiratory effort Musculoskeletal: no edema Skin: negatives: no rash Neuro: non-focal  Lab Results  Component Value Date   WBC 7.8 08/30/2021   HGB 10.6 (L) 08/30/2021   HCT 32.2 (L) 08/30/2021   MCV 82.6 08/30/2021   PLT 94 (L) 08/30/2021    Lab Results  Component Value Date   CREATININE 1.02 08/30/2021   BUN 13 08/30/2021   NA 136 08/30/2021   K 4.4 08/30/2021   CL 104 08/30/2021   CO2 26 08/30/2021    Lab Results  Component Value Date   ALT 38 08/27/2021   AST 37 08/27/2021   ALKPHOS 112 08/27/2021     Microbiology: Recent Results (from the past 240 hour(s))  Blood Culture (routine x 2)     Status: Abnormal (Preliminary result)   Collection Time: 08/27/21 10:06 PM   Specimen: BLOOD  Result Value Ref Range Status   Specimen Description   Final    BLOOD LEFT ANTECUBITAL Performed at [facility], [place] [address] [place], [place] [address]    Special Requests   Final     BOTTLES DRAWN AEROBIC AND ANAEROBIC Blood Culture adequate volume Performed at [facility] [address], [place], [place] [address]    Culture  Setup Time   Final    GRAM POSITIVE COCCI IN CHAINS IN BOTH AEROBIC AND ANAEROBIC BOTTLES CRITICAL RESULT CALLED TO, READ BACK BY AND VERIFIED WITH: T,GREEN PHARMD @1632  08/28/21 EB    Culture (A)  Final    STREPTOCOCCUS MITIS/ORALIS CULTURE REINCUBATED FOR BETTER GROWTH THE SIGNIFICANCE OF ISOLATING THIS ORGANISM FROM A SINGLE SET OF BLOOD CULTURES WHEN MULTIPLE SETS ARE DRAWN IS UNCERTAIN. PLEASE NOTIFY THE MICROBIOLOGY DEPARTMENT WITHIN ONE WEEK IF SPECIATION AND SENSITIVITIES ARE REQUIRED. Performed at [facility], [place] [address], [place], [place] [address]    Report Status PENDING  Incomplete  Urine Culture     Status: Abnormal   Collection Time: 08/27/21 10:06 PM   Specimen: In/Out Cath Urine  Result Value Ref Range Status   Specimen Description   Final    IN/OUT CATH URINE Performed at [facility] [address], [place], [place] [address]    Special Requests   Final    NONE Performed at [facility], [place] [address], [place], [place] [address]    Culture (A)  Final    1,000 COLONIES/mL ENTEROBACTER CLOACAE 1,000 COLONIES/mL STAPHYLOCOCCUS EPIDERMIDIS    Report Status 08/30/2021 FINAL  Final   Organism ID, Bacteria ENTEROBACTER CLOACAE (A)  Final   Organism ID, Bacteria STAPHYLOCOCCUS EPIDERMIDIS (A)  Final      Susceptibility   Enterobacter cloacae - MIC*    CEFAZOLIN >=64 RESISTANT Resistant     CEFEPIME <=0.12 SENSITIVE Sensitive     CIPROFLOXACIN <=0.25 SENSITIVE Sensitive     GENTAMICIN <=1 SENSITIVE Sensitive     IMIPENEM <=0.25 SENSITIVE Sensitive     NITROFURANTOIN 64 INTERMEDIATE Intermediate     TRIMETH/SULFA <=20 SENSITIVE Sensitive     PIP/TAZO <=4 SENSITIVE Sensitive     * 1,000 COLONIES/mL ENTEROBACTER CLOACAE   Staphylococcus epidermidis -  MIC*    CIPROFLOXACIN <=0.5 SENSITIVE Sensitive     GENTAMICIN <=0.5 SENSITIVE Sensitive     NITROFURANTOIN <=16 SENSITIVE Sensitive     OXACILLIN >=4 RESISTANT Resistant     TETRACYCLINE 2 SENSITIVE Sensitive     VANCOMYCIN 2 SENSITIVE Sensitive     TRIMETH/SULFA <=10 SENSITIVE Sensitive  CLINDAMYCIN <=0.25 SENSITIVE Sensitive     RIFAMPIN <=0.5 SENSITIVE Sensitive     Inducible Clindamycin NEGATIVE Sensitive     * 1,000 COLONIES/mL STAPHYLOCOCCUS EPIDERMIDIS  Blood Culture ID Panel (Reflexed)     Status: Abnormal   Collection Time: 08/27/21 10:06 PM  Result Value Ref Range Status   Enterococcus faecalis NOT DETECTED NOT DETECTED Final   Enterococcus Faecium NOT DETECTED NOT DETECTED Final   Listeria monocytogenes NOT DETECTED NOT DETECTED Final   Staphylococcus species NOT DETECTED NOT DETECTED Final   Staphylococcus aureus (BCID) NOT DETECTED NOT DETECTED Final   Staphylococcus epidermidis NOT DETECTED NOT DETECTED Final   Staphylococcus lugdunensis NOT DETECTED NOT DETECTED Final   Streptococcus species DETECTED (A) NOT DETECTED Final    Comment: Not Enterococcus species, Streptococcus agalactiae, Streptococcus pyogenes, or Streptococcus pneumoniae. CRITICAL RESULT CALLED TO, READ BACK BY AND VERIFIED WITH: T,GREEN PHARMD @1632  08/28/21 EB    Streptococcus agalactiae NOT DETECTED NOT DETECTED Final   Streptococcus pneumoniae NOT DETECTED NOT DETECTED Final   Streptococcus pyogenes NOT DETECTED NOT DETECTED Final   A.calcoaceticus-baumannii NOT DETECTED NOT DETECTED Final   Bacteroides fragilis NOT DETECTED NOT DETECTED Final   Enterobacterales NOT DETECTED NOT DETECTED Final   Enterobacter cloacae complex NOT DETECTED NOT DETECTED Final   Escherichia coli NOT DETECTED NOT DETECTED Final   Klebsiella aerogenes NOT DETECTED NOT DETECTED Final   Klebsiella oxytoca NOT DETECTED NOT DETECTED Final   Klebsiella pneumoniae NOT DETECTED NOT DETECTED Final   Proteus species NOT  DETECTED NOT DETECTED Final   Salmonella species NOT DETECTED NOT DETECTED Final   Serratia marcescens NOT DETECTED NOT DETECTED Final   Haemophilus influenzae NOT DETECTED NOT DETECTED Final   Neisseria meningitidis NOT DETECTED NOT DETECTED Final   Pseudomonas aeruginosa NOT DETECTED NOT DETECTED Final   Stenotrophomonas maltophilia NOT DETECTED NOT DETECTED Final   Candida albicans NOT DETECTED NOT DETECTED Final   Candida auris NOT DETECTED NOT DETECTED Final   Candida glabrata NOT DETECTED NOT DETECTED Final   Candida krusei NOT DETECTED NOT DETECTED Final   Candida parapsilosis NOT DETECTED NOT DETECTED Final   Candida tropicalis NOT DETECTED NOT DETECTED Final   Cryptococcus neoformans/gattii NOT DETECTED NOT DETECTED Final    Comment: Performed at [facility], [address], [place], [place] [address]  Resp Panel by RT-PCR (Flu A&B, Covid) Nasopharyngeal Swab     Status: None   Collection Time: 08/27/21 10:07 PM   Specimen: Nasopharyngeal Swab; Nasopharyngeal(NP) swabs in vial transport medium  Result Value Ref Range Status   SARS Coronavirus 2 by RT PCR NEGATIVE NEGATIVE Final    Comment: (NOTE) SARS-CoV-2 target nucleic acids are NOT DETECTED.  The SARS-CoV-2 RNA is generally detectable in upper respiratory specimens during the acute phase of infection. The lowest concentration of SARS-CoV-2 viral copies this assay can detect is 138 copies/mL. A negative result does not preclude SARS-Cov-2 infection and should not be used as the sole basis for treatment or other patient management decisions. A negative result may occur with  improper specimen collection/handling, submission of specimen other than nasopharyngeal swab, presence of viral mutation(s) within the areas targeted by this assay, and inadequate number of viral copies(<138 copies/mL). A negative result must be combined with clinical observations, patient history, and epidemiological information. The expected  result is Negative.  Fact Sheet for Patients:  EntrepreneurPulse.com.au  Fact Sheet for Healthcare Providers:  IncredibleEmployment.be  This test is no t yet approved or cleared by the [place]  States FDA and  has been authorized for detection and/or diagnosis of SARS-CoV-2 by FDA under an Emergency Use Authorization (EUA). This EUA will remain  in effect (meaning this test can be used) for the duration of the COVID-19 declaration under Section 564(b)(1) of the Act, 21 U.S.C.section 360bbb-3(b)(1), unless the authorization is terminated  or revoked sooner.       Influenza A by PCR NEGATIVE NEGATIVE Final   Influenza B by PCR NEGATIVE NEGATIVE Final    Comment: (NOTE) The Xpert Xpress SARS-CoV-2/FLU/RSV plus assay is intended as an aid in the diagnosis of influenza from Nasopharyngeal swab specimens and should not be used as a sole basis for treatment. Nasal washings and aspirates are unacceptable for Xpert Xpress SARS-CoV-2/FLU/RSV testing.  Fact Sheet for Patients: EntrepreneurPulse.com.au  Fact Sheet for Healthcare Providers: IncredibleEmployment.be  This test is not yet approved or cleared by the [place] FDA and has been authorized for detection and/or diagnosis of SARS-CoV-2 by FDA under an Emergency Use Authorization (EUA). This EUA will remain in effect (meaning this test can be used) for the duration of the COVID-19 declaration under Section 564(b)(1) of the Act, 21 U.S.C. section 360bbb-3(b)(1), unless the authorization is terminated or revoked.  Performed at [facility], [place] [address] [place], [place] [address]   Blood Culture (routine x 2)     Status: None (Preliminary result)   Collection Time: 08/27/21 10:11 PM   Specimen: BLOOD  Result Value Ref Range Status   Specimen Description   Final    BLOOD BLOOD LEFT FOREARM Performed at [facility] [address], [place], [place] [address]    Special Requests   Final    BOTTLES DRAWN AEROBIC AND ANAEROBIC Blood Culture adequate volume Performed at [facility] [address] [place], [place] [address]    Culture   Final    NO GROWTH 2 DAYS Performed at [facility] [address], [place], [place] [address]    Report Status PENDING  Incomplete  MRSA Next Gen by PCR, Nasal     Status: None   Collection Time: 08/28/21  8:17 PM   Specimen: Nasal Mucosa; Nasal Swab  Result Value Ref Range Status   MRSA by PCR Next Gen NOT DETECTED NOT DETECTED Final    Comment: (NOTE) The GeneXpert MRSA Assay (FDA approved for NASAL specimens only), is one component of a comprehensive MRSA colonization surveillance program. It is not intended to diagnose MRSA infection nor to guide or monitor treatment for MRSA infections. Test performance is not FDA approved in patients less than 99 years old. Performed at [facility], [place] [address], [place], [place] [address]   Culture, blood (routine x 2)     Status: None (Preliminary result)   Collection Time: 08/29/21  2:39 PM   Specimen: BLOOD  Result Value Ref Range Status   Specimen Description   Final    BLOOD RIGHT ANTECUBITAL Performed at [facility] [address], [place], [place] [address]    Special Requests   Final    BOTTLES DRAWN AEROBIC ONLY Blood Culture adequate volume Performed at [facility] [address], [place], [place] [address]    Culture   Final    NO GROWTH < 24 HOURS Performed at [facility] [address] [place], [place] [address]    Report Status PENDING  Incomplete  Culture, blood (routine x 2)  Status: None (Preliminary result)   Collection Time: 08/29/21  2:39 PM   Specimen: BLOOD  Result Value Ref Range Status   Specimen Description   Final    BLOOD RIGHT ANTECUBITAL Performed at [facility] [address], [place], [place] [address]    Special Requests   Final    BOTTLES DRAWN AEROBIC AND ANAEROBIC Blood Culture adequate volume Performed at [facility] [address] [place], [place] [address]    Culture   Final    NO GROWTH < 24 HOURS Performed at [facility] [address] [place], [place] [address]    Report Status PENDING  Incomplete    Thayer Headings, [facility] [email or web address] [email or web address] 08/30/2021, 3:36 PM

## 2021-08-31 ENCOUNTER — Telehealth: Payer: Self-pay | Admitting: Hematology

## 2021-08-31 ENCOUNTER — Inpatient Hospital Stay: Payer: Medicare HMO | Admitting: Genetic Counselor

## 2021-08-31 DIAGNOSIS — I480 Paroxysmal atrial fibrillation: Secondary | ICD-10-CM | POA: Diagnosis not present

## 2021-08-31 DIAGNOSIS — E43 Unspecified severe protein-calorie malnutrition: Secondary | ICD-10-CM | POA: Insufficient documentation

## 2021-08-31 LAB — CBC WITH DIFFERENTIAL/PLATELET
Abs Immature Granulocytes: 0.03 10*3/uL (ref 0.00–0.07)
Basophils Absolute: 0 10*3/uL (ref 0.0–0.1)
Basophils Relative: 0 %
Eosinophils Absolute: 0.1 10*3/uL (ref 0.0–0.5)
Eosinophils Relative: 4 %
HCT: 29.2 % — ABNORMAL LOW (ref 39.0–52.0)
Hemoglobin: 9.9 g/dL — ABNORMAL LOW (ref 13.0–17.0)
Immature Granulocytes: 1 %
Lymphocytes Relative: 33 %
Lymphs Abs: 1.3 10*3/uL (ref 0.7–4.0)
MCH: 27.3 pg (ref 26.0–34.0)
MCHC: 33.9 g/dL (ref 30.0–36.0)
MCV: 80.4 fL (ref 80.0–100.0)
Monocytes Absolute: 0 10*3/uL — ABNORMAL LOW (ref 0.1–1.0)
Monocytes Relative: 1 %
Neutro Abs: 2.5 10*3/uL (ref 1.7–7.7)
Neutrophils Relative %: 61 %
Platelets: 94 10*3/uL — ABNORMAL LOW (ref 150–400)
RBC: 3.63 MIL/uL — ABNORMAL LOW (ref 4.22–5.81)
RDW: 14.2 % (ref 11.5–15.5)
WBC: 4 10*3/uL (ref 4.0–10.5)
nRBC: 0 % (ref 0.0–0.2)

## 2021-08-31 LAB — BASIC METABOLIC PANEL
Anion gap: 5 (ref 5–15)
BUN: 13 mg/dL (ref 8–23)
CO2: 26 mmol/L (ref 22–32)
Calcium: 7.7 mg/dL — ABNORMAL LOW (ref 8.9–10.3)
Chloride: 102 mmol/L (ref 98–111)
Creatinine, Ser: 0.89 mg/dL (ref 0.61–1.24)
GFR, Estimated: >60 mL/min (ref >60)
Glucose, Bld: 104 mg/dL — ABNORMAL HIGH (ref 70–99)
Potassium: 3.3 mmol/L — ABNORMAL LOW (ref 3.5–5.1)
Sodium: 133 mmol/L — ABNORMAL LOW (ref 135–145)

## 2021-08-31 MED ORDER — APIXABAN 5 MG PO TABS: 5.0000 mg | ORAL_TABLET | Freq: Two times a day (BID) | ORAL | 0 refills | Status: DC

## 2021-08-31 MED ORDER — ADULT MULTIVITAMIN W/MINERALS CH: 1.0000 | ORAL_TABLET | Freq: Every day | ORAL | 0 refills | Status: DC

## 2021-08-31 MED ORDER — ADULT MULTIVITAMIN W/MINERALS CH
1.0000 | ORAL_TABLET | Freq: Every day | ORAL | Status: DC
  Administered 2021-08-31: 1 via ORAL
  Filled 2021-08-31: qty 1

## 2021-08-31 MED ORDER — PROSOURCE PLUS PO LIQD: 30.0000 mL | Freq: Two times a day (BID) | ORAL | Status: DC

## 2021-08-31 MED ORDER — ENSURE ENLIVE PO LIQD: 237.0000 mL | Freq: Three times a day (TID) | ORAL | 12 refills | Status: DC

## 2021-08-31 MED ORDER — DILTIAZEM HCL 30 MG PO TABS: 30.0000 mg | ORAL_TABLET | Freq: Three times a day (TID) | ORAL | 0 refills | Status: DC

## 2021-08-31 MED ORDER — POTASSIUM CHLORIDE CRYS ER 20 MEQ PO TBCR
40.0000 meq | EXTENDED_RELEASE_TABLET | Freq: Once | ORAL | Status: AC
  Administered 2021-08-31: 40 meq via ORAL
  Filled 2021-08-31: qty 2

## 2021-08-31 MED ORDER — AMOXICILLIN 500 MG PO TABS: 500.0000 mg | ORAL_TABLET | Freq: Three times a day (TID) | ORAL | 0 refills | Status: AC

## 2021-08-31 MED ORDER — ENSURE ENLIVE PO LIQD: 237.0000 mL | Freq: Three times a day (TID) | ORAL | Status: DC

## 2021-08-31 MED ORDER — PROSOURCE PLUS PO LIQD
30.0000 mL | Freq: Two times a day (BID) | ORAL | Status: DC
  Administered 2021-08-31: 30 mL via ORAL
  Filled 2021-08-31: qty 30

## 2021-08-31 NOTE — Telephone Encounter (Signed)
Left message with follow-up appointments per 11/3 los. 

## 2021-08-31 NOTE — Progress Notes (Signed)
Occupational Therapy Treatment Patient Details Name: Angel Keller Sr. MRN: [id number] DOB: 04/09/1941 Today's Date: 08/31/2021   History of present illness patient is a 81 year old male who presented to the hosptial with fever and fatigue day after chemo. patietnw as dount to ahve paroxysmal a fib, bacteremia, SIRS and hypotension. PMH: HTN, bilateral rotator cuff repair, aneurysm, overactive bladder, pancreatic cancer   OT comments  Patient evaluated by Occupational Therapy with no further acute OT needs identified. All education has been completed and the patient has no further questions. Patient is Sup for Adls at this time with dizziness noted. Patient is able to complete all ADL tasks with no physical assistance during session.  Patient was educated on ECT strategies for home. See below for any follow-up Occupational Therapy or equipment needs. OT is signing off. Thank you for this referral.    Recommendations for follow up therapy are one component of a multi-disciplinary discharge planning process, led by the attending physician.  Recommendations may be updated based on patient status, additional functional criteria and insurance authorization.    Follow Up Recommendations  No OT follow up    Assistance Recommended at Discharge Intermittent Supervision/Assistance  Patient can return home with the following  Assistance with cooking/housework;Direct supervision/assist for financial management;Direct supervision/assist for medications management;Help with stairs or ramp for entrance   Equipment Recommendations  None recommended by OT    Recommendations for Other Services      Precautions / Restrictions Precautions Precautions: Fall Restrictions Weight Bearing Restrictions: No       Mobility Bed Mobility               General bed mobility comments: was seated in recliner    Transfers                         Balance Overall balance assessment: Mild  deficits observed, not formally tested                                         ADL either performed or assessed with clinical judgement   ADL Overall ADL's : Needs assistance/impaired Eating/Feeding: Modified independent;Sitting   Grooming: Sitting;Modified independent   Upper Body Bathing: Sitting;Modified independent   Lower Body Bathing: Sit to/from stand;Sitting/lateral leans;Supervison/ safety   Upper Body Dressing : Sitting;Modified independent   Lower Body Dressing: Sit to/from stand;Supervision/safety Lower Body Dressing Details (indicate cue type and reason): was able to don/doff socks sitting on green mat in room Toilet Transfer: Supervision/safety;Ambulation Toilet Transfer Details (indicate cue type and reason): with no AD with some dizziness noted upon first stand Toileting- Clothing Manipulation and Hygiene: Supervision/safety;Sit to/from stand       Functional mobility during ADLs: Supervision/safety      Extremity/Trunk Assessment Upper Extremity Assessment Upper Extremity Assessment: Overall WFL for tasks assessed (h/o bilateral rotator cuff repair)   Lower Extremity Assessment Lower Extremity Assessment: Defer to PT evaluation   Cervical / Trunk Assessment Cervical / Trunk Assessment: Normal    Vision Patient Visual Report: No change from baseline     Perception     Praxis      Cognition Arousal/Alertness: Awake/alert Behavior During Therapy: WFL for tasks assessed/performed Overall Cognitive Status: Within Functional Limits for tasks assessed  Exercises      Shoulder Instructions       General Comments      Pertinent Vitals/ Pain       Pain Assessment Pain Assessment: No/denies pain  Home Living Family/patient expects to be discharged to:: Private residence Living Arrangements: Children Available Help at Discharge: Family;Available PRN/intermittently Type  of Home: House Home Access: Stairs to enter [employer] of Steps: 3 Entrance Stairs-Rails: Left;Right Home Layout: One level     Bathroom Shower/Tub: Teacher, early years/pre: Standard     Home Equipment: None          Prior Functioning/Environment              Frequency           Progress Toward Goals  OT Goals(current goals can now be found in the care plan section)     Acute Rehab OT Goals OT Goal Formulation: All assessment and education complete, DC therapy  Plan      Co-evaluation                 AM-PAC OT "6 Clicks" Daily Activity     Outcome Measure   Help from another person eating meals?: None Help from another person taking care of personal grooming?: None Help from another person toileting, which includes using toliet, bedpan, or urinal?: A Little Help from another person bathing (including washing, rinsing, drying)?: A Little Help from another person to put on and taking off regular upper body clothing?: None Help from another person to put on and taking off regular lower body clothing?: A Little 6 Click Score: 21    End of Session Equipment Utilized During Treatment: Gait belt  OT Visit Diagnosis: Unsteadiness on feet (R26.81)   Activity Tolerance Patient tolerated treatment well   Patient Left in chair;with call bell/phone within reach;with chair alarm set   Nurse Communication Mobility status        Time: 3825-0539 OT Time Calculation (min): 26 min  Charges: OT General Charges $OT Visit: 1 Visit OT Evaluation $OT Eval Low Complexity: 1 Low OT Treatments $Self Care/Home Management : 8-22 mins  Jackelyn Poling OTR/L, MS Acute Rehabilitation Department Office# [phone] Pager# [phone]   Marcellina Millin 08/31/2021, 12:49 PM

## 2021-08-31 NOTE — Progress Notes (Signed)
[facility] Infectious Disease   Reason for visit: Follow up on bacteremia  Interval History: feels well, best he has felt since coming in.  WBC wnl, remains afebrile.  Eating.  No associated rash or diarrhea. Day 5 total antibiotics  Physical Exam: Constitutional:  Vitals:   08/31/21 1408 08/31/21 1422  BP:  113/76  Pulse:  75  Resp:    Temp: 97.7 F (36.5 C)   SpO2:  100%   patient appears in NAD Respiratory: Normal respiratory effort; CTA B Cardiovascular: RRR GI: soft, nt, nd  Review of Systems: Constitutional: negative for fevers and chills Gastrointestinal: negative for nausea and diarrhea  Lab Results  Component Value Date   WBC 4.0 08/31/2021   HGB 9.9 (L) 08/31/2021   HCT 29.2 (L) 08/31/2021   MCV 80.4 08/31/2021   PLT 94 (L) 08/31/2021    Lab Results  Component Value Date   CREATININE 0.89 08/31/2021   BUN 13 08/31/2021   NA 133 (L) 08/31/2021   K 3.3 (L) 08/31/2021   CL 102 08/31/2021   CO2 26 08/31/2021    Lab Results  Component Value Date   ALT 38 08/27/2021   AST 37 08/27/2021   ALKPHOS 112 08/27/2021     Microbiology: Recent Results (from the past 240 hour(s))  Blood Culture (routine x 2)     Status: Abnormal (Preliminary result)   Collection Time: 08/27/21 10:06 PM   Specimen: BLOOD  Result Value Ref Range Status   Specimen Description   Final    BLOOD LEFT ANTECUBITAL Performed at [facility], [place] [address], [place], [place] [address]    Special Requests   Final    BOTTLES DRAWN AEROBIC AND ANAEROBIC Blood Culture adequate volume Performed at [facility] [address], [place], [place] [address]    Culture  Setup Time   Final    GRAM POSITIVE COCCI IN CHAINS IN BOTH AEROBIC AND ANAEROBIC BOTTLES CRITICAL RESULT CALLED TO, READ BACK BY AND VERIFIED WITH: T,GREEN PHARMD @1632  08/28/21 EB    Culture (A)  Final    STREPTOCOCCUS MITIS/ORALIS CULTURE REINCUBATED FOR BETTER  GROWTH THE SIGNIFICANCE OF ISOLATING THIS ORGANISM FROM A SINGLE SET OF BLOOD CULTURES WHEN MULTIPLE SETS ARE DRAWN IS UNCERTAIN. PLEASE NOTIFY THE MICROBIOLOGY DEPARTMENT WITHIN ONE WEEK IF SPECIATION AND SENSITIVITIES ARE REQUIRED. Performed at [facility], [place] [address] [place], [place] [address]    Report Status PENDING  Incomplete  Urine Culture     Status: Abnormal   Collection Time: 08/27/21 10:06 PM   Specimen: In/Out Cath Urine  Result Value Ref Range Status   Specimen Description   Final    IN/OUT CATH URINE Performed at [facility] [address], [place], [place] [address]    Special Requests   Final    NONE Performed at [facility], [place] [address], [place], [place] [address]    Culture (A)  Final    1,000 COLONIES/mL ENTEROBACTER CLOACAE 1,000 COLONIES/mL STAPHYLOCOCCUS EPIDERMIDIS    Report Status 08/30/2021 FINAL  Final   Organism ID, Bacteria ENTEROBACTER CLOACAE (A)  Final   Organism ID, Bacteria STAPHYLOCOCCUS EPIDERMIDIS (A)  Final      Susceptibility   Enterobacter cloacae - MIC*    CEFAZOLIN >=64 RESISTANT Resistant     CEFEPIME <=0.12 SENSITIVE Sensitive     CIPROFLOXACIN <=0.25 SENSITIVE Sensitive     GENTAMICIN <=1 SENSITIVE Sensitive  IMIPENEM <=0.25 SENSITIVE Sensitive     NITROFURANTOIN 64 INTERMEDIATE Intermediate     TRIMETH/SULFA <=20 SENSITIVE Sensitive     PIP/TAZO <=4 SENSITIVE Sensitive     * 1,000 COLONIES/mL ENTEROBACTER CLOACAE   Staphylococcus epidermidis - MIC*    CIPROFLOXACIN <=0.5 SENSITIVE Sensitive     GENTAMICIN <=0.5 SENSITIVE Sensitive     NITROFURANTOIN <=16 SENSITIVE Sensitive     OXACILLIN >=4 RESISTANT Resistant     TETRACYCLINE 2 SENSITIVE Sensitive     VANCOMYCIN 2 SENSITIVE Sensitive     TRIMETH/SULFA <=10 SENSITIVE Sensitive     CLINDAMYCIN <=0.25 SENSITIVE Sensitive     RIFAMPIN <=0.5 SENSITIVE Sensitive     Inducible Clindamycin NEGATIVE Sensitive     * 1,000  COLONIES/mL STAPHYLOCOCCUS EPIDERMIDIS  Blood Culture ID Panel (Reflexed)     Status: Abnormal   Collection Time: 08/27/21 10:06 PM  Result Value Ref Range Status   Enterococcus faecalis NOT DETECTED NOT DETECTED Final   Enterococcus Faecium NOT DETECTED NOT DETECTED Final   Listeria monocytogenes NOT DETECTED NOT DETECTED Final   Staphylococcus species NOT DETECTED NOT DETECTED Final   Staphylococcus aureus (BCID) NOT DETECTED NOT DETECTED Final   Staphylococcus epidermidis NOT DETECTED NOT DETECTED Final   Staphylococcus lugdunensis NOT DETECTED NOT DETECTED Final   Streptococcus species DETECTED (A) NOT DETECTED Final    Comment: Not Enterococcus species, Streptococcus agalactiae, Streptococcus pyogenes, or Streptococcus pneumoniae. CRITICAL RESULT CALLED TO, READ BACK BY AND VERIFIED WITH: T,GREEN PHARMD @1632  08/28/21 EB    Streptococcus agalactiae NOT DETECTED NOT DETECTED Final   Streptococcus pneumoniae NOT DETECTED NOT DETECTED Final   Streptococcus pyogenes NOT DETECTED NOT DETECTED Final   A.calcoaceticus-baumannii NOT DETECTED NOT DETECTED Final   Bacteroides fragilis NOT DETECTED NOT DETECTED Final   Enterobacterales NOT DETECTED NOT DETECTED Final   Enterobacter cloacae complex NOT DETECTED NOT DETECTED Final   Escherichia coli NOT DETECTED NOT DETECTED Final   Klebsiella aerogenes NOT DETECTED NOT DETECTED Final   Klebsiella oxytoca NOT DETECTED NOT DETECTED Final   Klebsiella pneumoniae NOT DETECTED NOT DETECTED Final   Proteus species NOT DETECTED NOT DETECTED Final   Salmonella species NOT DETECTED NOT DETECTED Final   Serratia marcescens NOT DETECTED NOT DETECTED Final   Haemophilus influenzae NOT DETECTED NOT DETECTED Final   Neisseria meningitidis NOT DETECTED NOT DETECTED Final   Pseudomonas aeruginosa NOT DETECTED NOT DETECTED Final   Stenotrophomonas maltophilia NOT DETECTED NOT DETECTED Final   Candida albicans NOT DETECTED NOT DETECTED Final   Candida auris  NOT DETECTED NOT DETECTED Final   Candida glabrata NOT DETECTED NOT DETECTED Final   Candida krusei NOT DETECTED NOT DETECTED Final   Candida parapsilosis NOT DETECTED NOT DETECTED Final   Candida tropicalis NOT DETECTED NOT DETECTED Final   Cryptococcus neoformans/gattii NOT DETECTED NOT DETECTED Final    Comment: Performed at [facility], [address], [place], [place] [address]  Resp Panel by RT-PCR (Flu A&B, Covid) Nasopharyngeal Swab     Status: None   Collection Time: 08/27/21 10:07 PM   Specimen: Nasopharyngeal Swab; Nasopharyngeal(NP) swabs in vial transport medium  Result Value Ref Range Status   SARS Coronavirus 2 by RT PCR NEGATIVE NEGATIVE Final    Comment: (NOTE) SARS-CoV-2 target nucleic acids are NOT DETECTED.  The SARS-CoV-2 RNA is generally detectable in upper respiratory specimens during the acute phase of infection. The lowest concentration of SARS-CoV-2 viral copies this assay can detect is 138 copies/mL. A negative result does  not preclude SARS-Cov-2 infection and should not be used as the sole basis for treatment or other patient management decisions. A negative result may occur with  improper specimen collection/handling, submission of specimen other than nasopharyngeal swab, presence of viral mutation(s) within the areas targeted by this assay, and inadequate number of viral copies(<138 copies/mL). A negative result must be combined with clinical observations, patient history, and epidemiological information. The expected result is Negative.  Fact Sheet for Patients:  EntrepreneurPulse.com.au  Fact Sheet for Healthcare Providers:  IncredibleEmployment.be  This test is no t yet approved or cleared by the [place] FDA and  has been authorized for detection and/or diagnosis of SARS-CoV-2 by FDA under an Emergency Use Authorization (EUA). This EUA will remain  in effect (meaning this test can be used) for the  duration of the COVID-19 declaration under Section 564(b)(1) of the Act, 21 U.S.C.section 360bbb-3(b)(1), unless the authorization is terminated  or revoked sooner.       Influenza A by PCR NEGATIVE NEGATIVE Final   Influenza B by PCR NEGATIVE NEGATIVE Final    Comment: (NOTE) The Xpert Xpress SARS-CoV-2/FLU/RSV plus assay is intended as an aid in the diagnosis of influenza from Nasopharyngeal swab specimens and should not be used as a sole basis for treatment. Nasal washings and aspirates are unacceptable for Xpert Xpress SARS-CoV-2/FLU/RSV testing.  Fact Sheet for Patients: EntrepreneurPulse.com.au  Fact Sheet for Healthcare Providers: IncredibleEmployment.be  This test is not yet approved or cleared by the [place] FDA and has been authorized for detection and/or diagnosis of SARS-CoV-2 by FDA under an Emergency Use Authorization (EUA). This EUA will remain in effect (meaning this test can be used) for the duration of the COVID-19 declaration under Section 564(b)(1) of the Act, 21 U.S.C. section 360bbb-3(b)(1), unless the authorization is terminated or revoked.  Performed at [facility], [place] [address], [place], [place] [address]   Blood Culture (routine x 2)     Status: None (Preliminary result)   Collection Time: 08/27/21 10:11 PM   Specimen: BLOOD  Result Value Ref Range Status   Specimen Description   Final    BLOOD BLOOD LEFT FOREARM Performed at [facility] [address], [place], [place] [address]    Special Requests   Final    BOTTLES DRAWN AEROBIC AND ANAEROBIC Blood Culture adequate volume Performed at [facility] [address] [place], [place] [address]    Culture   Final    NO GROWTH 3 DAYS Performed at [facility], [place] [address], [place], [place] [address]    Report Status PENDING  Incomplete  MRSA Next Gen by PCR, Nasal     Status: None    Collection Time: 08/28/21  8:17 PM   Specimen: Nasal Mucosa; Nasal Swab  Result Value Ref Range Status   MRSA by PCR Next Gen NOT DETECTED NOT DETECTED Final    Comment: (NOTE) The GeneXpert MRSA Assay (FDA approved for NASAL specimens only), is one component of a comprehensive MRSA colonization surveillance program. It is not intended to diagnose MRSA infection nor to guide or monitor treatment for MRSA infections. Test performance is not FDA approved in patients less than 1 years old. Performed at [facility], [place] [address] [place], [place] [address]   Culture, blood (routine x 2)     Status: None (Preliminary result)   Collection Time: 08/29/21  2:39 PM   Specimen: BLOOD  Result Value Ref Range  Status   Specimen Description   Final    BLOOD RIGHT ANTECUBITAL Performed at [facility] [address], [place], [place] [address]    Special Requests   Final    BOTTLES DRAWN AEROBIC ONLY Blood Culture adequate volume Performed at [facility] [address] [place], [place] [address]    Culture   Final    NO GROWTH 2 DAYS Performed at [facility] [address], [place], [place] [address]    Report Status PENDING  Incomplete  Culture, blood (routine x 2)     Status: None (Preliminary result)   Collection Time: 08/29/21  2:39 PM   Specimen: BLOOD  Result Value Ref Range Status   Specimen Description   Final    BLOOD RIGHT ANTECUBITAL Performed at [facility] [address] [place], [place] [address]    Special Requests   Final    BOTTLES DRAWN AEROBIC AND ANAEROBIC Blood Culture adequate volume Performed at [facility] [address], [place], [place] [address]    Culture   Final    NO GROWTH 2 DAYS Performed at [facility] [address], [place], [place] [address]    Report Status PENDING  Incomplete    Impression/Plan:  1. Bacteremia - no new  positive cultures and clinically doing well.  No complaints and continue with ceftriaxone. Can use oral amoxicillin 500 mg tid at discharge to complete 7 days from negative blood culture from 1/15, now day 3/7.    2.  Fever - no fever since his initial fever on admission.  WBC also wnl.    I will sign off, call with questions.

## 2021-08-31 NOTE — TOC Initial Note (Signed)
Transition of Care (TOC) - Initial/Assessment Note    Patient Details  Name: Angel DIBIASIO Sr. MRN: [id number] Date of Birth: 12/01/1940  Transition of Care [facility]) CM/SW Contact:    Leeroy Cha, RN Phone Number: 08/31/2021, 8:26 AM  Clinical Narrative:                  Transition of Care [facility]) Screening Note   Patient Details  Name: Angel BEVINS Sr. Date of Birth: [birth year]   Transition of Care [facility]) CM/SW Contact:    Leeroy Cha, RN Phone Number: 08/31/2021, 8:26 AM    Transition of Care Department [facility]) has reviewed patient and no TOC needs have been identified at this time. We will continue to monitor patient advancement through interdisciplinary progression rounds. If new patient transition needs arise, please place a TOC consult.    Expected Discharge Plan: Home/Self Care Barriers to Discharge: Continued Medical Work up   Patient Goals and CMS Choice Patient states their goals for this hospitalization and ongoing recovery are:: to go home CMS Medicare.gov Compare Post Acute Care list provided to:: Patient Choice offered to / list presented to : Patient  Expected Discharge Plan and Services Expected Discharge Plan: Home/Self Care   Discharge Planning Services: CM Consult   Living arrangements for the past 2 months: Single Family Home                                      Prior Living Arrangements/Services Living arrangements for the past 2 months: Single Family Home Lives with:: Self Patient language and need for interpreter reviewed:: Yes Do you feel safe going back to the place where you live?: Yes            Criminal Activity/Legal Involvement Pertinent to Current Situation/Hospitalization: No - Comment as needed  Activities of Daily Living Home Assistive Devices/Equipment: Eyeglasses, Hand-held shower hose, Dentures (specify type) ADL Screening (condition at time of admission) Patient's cognitive ability adequate to safely  complete daily activities?: Yes Is the patient deaf or have difficulty hearing?: No Does the patient have difficulty seeing, even when wearing glasses/contacts?: Yes Does the patient have difficulty concentrating, remembering, or making decisions?: No Patient able to express need for assistance with ADLs?: Yes Does the patient have difficulty dressing or bathing?: No Independently performs ADLs?: Yes (appropriate for developmental age) Does the patient have difficulty walking or climbing stairs?: No Weakness of Legs: Both Weakness of Arms/Hands: Both  Permission Sought/Granted                  Emotional Assessment Appearance:: Appears stated age Attitude/Demeanor/Rapport: Engaged Affect (typically observed): Calm Orientation: : Oriented to Place, Oriented to Self, Oriented to  Time, Oriented to Situation Alcohol / Substance Use: Not Applicable Psych Involvement: No (comment)  Admission diagnosis:  SIRS (systemic inflammatory response syndrome) (HCC) [R65.10] Fever, unspecified fever cause [R50.9] Patient Active Problem List   Diagnosis Date Noted   SIRS (systemic inflammatory response syndrome) ([place]) 08/28/2021   AF (paroxysmal atrial fibrillation) ([place]) 08/28/2021   Hypotension 08/28/2021   Hypokalemia 08/28/2021   Hyponatremia 08/28/2021   Fever 08/28/2021   Prolonged QT interval 08/28/2021   Abnormal weight loss 08/26/2021   Situational stress 08/26/2021   Port-A-Cath in place 08/20/2021   Pancreatic cancer ([place]) 08/14/2021   Nonspecific abnormal results of liver function study 08/09/2021   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  Nausea 08/09/2021   Iron deficiency anemia 08/09/2021   Abnormal INR 08/09/2021   Obstructive jaundice 07/18/2021   Pancreatic mass 07/18/2021   Acute non-recurrent pansinusitis 07/11/2021   Shortness of breath 07/11/2021   Acute respiratory failure due to COVID-19 [facility]) 07/27/2020   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   Acquired hypothyroidism 07/15/2009   FATIGUE 07/15/2009   SORE THROAT 06/11/2009   ALLERGIC RHINITIS 05/25/2009   HOARSENESS 05/25/2009   HYPERTHYROIDISM 11/07/2008   Essential hypertension 11/07/2008   DIVERTICULOSIS, COLON 11/07/2008   HLD (hyperlipidemia) 10/04/2007   Anxiety 10/04/2007   Depression 10/04/2007   GERD 10/04/2007   COLONIC POLYPS, HX OF 10/04/2007   PCP:  Ronnell Freshwater, NP Pharmacy:   [facility] [place], [place] [address] Phone: [phone] Fax: [phone]  [facility] [place], [place] [address] Phone: [phone] Fax: [phone]     Social Determinants of Health (SDOH) Interventions    Readmission Risk Interventions No flowsheet data found.

## 2021-08-31 NOTE — Progress Notes (Signed)
SATURATION QUALIFICATIONS: (This note is used to comply with regulatory documentation for home oxygen)  Patient Saturations on Room Air at Rest = 98%  Patient Saturations on Room Air while Ambulating = 98%  Angie Fava, RN

## 2021-08-31 NOTE — Plan of Care (Signed)

## 2021-08-31 NOTE — Progress Notes (Signed)
Cardiac monitoring and PIVs removed.  Discharge paperwork given to and reviewed with patient and patient's daughter Lattie Haw.  Patient and belongings escorted to main entrance for transport home with daughter.  Angie Fava, RN

## 2021-08-31 NOTE — Evaluation (Signed)
Physical Therapy Evaluation Patient Details Name: Angel ENGEN Sr. MRN: [id number] DOB: 1940/11/27 Today's Date: 08/31/2021  History of Present Illness  patient is a 81 year old male who presented to the hosptial with fever and fatigue day after chemo. patietnw as dount to ahve paroxysmal a fib, bacteremia, SIRS and hypotension. PMH: HTN, bilateral rotator cuff repair, aneurysm, overactive bladder, pancreatic cancer   Clinical Impression  Angel Frix. is 81 y.o. male admitted with above HPI and diagnosis. Patient is currently limited by functional impairments below (see PT problem list). Patient lives with his daughter and is independent with mobility at baseline. He was able to ambulate ~400' with no AD today and close min guard to steady. Pt declined UE support with IV pole or RW. Patient will benefit from continued skilled PT interventions to address impairments and progress independence with mobility, recommending HHPT. Acute PT will follow and progress as able.        Recommendations for follow up therapy are one component of a multi-disciplinary discharge planning process, led by the attending physician.  Recommendations may be updated based on patient status, additional functional criteria and insurance authorization.  PT Recommendation   Follow Up Recommendations Home health PT Filed 08/31/2021 0800  Assistance recommended at discharge Intermittent Supervision/Assistance Filed 08/31/2021 0800  Functional Status Assessment Patient has had a recent decline in their functional status and demonstrates the ability to make significant improvements in function in a reasonable and predictable amount of time. Filed 08/31/2021 0800  PT equipment Rolling walker (2 wheels)  [TBA] Filed 08/31/2021 0800   Precautions / Restrictions Precautions Precautions: Fall Restrictions Weight Bearing Restrictions: No       08/31/21 0800  PT Visit Information  Last PT Received On 08/31/21  Assistance  Needed +1  History of Present Illness Patient is an 81 y.o. male presented to [facility] on 08/28/2021 after a fever that began on the night of 08/27/2021. The patient has pancreatic cancer and had chemotherapy treatment the day before devloping fever. When he arrived in the ED he was hypotensive with BP 80/40 which responded to IV fluids. No source of infection was found, but he was started on broad spectrum IV antibiotics. PMH significant for HTN, bilateral rotator cuff repair, aneurysm, overactive bladder, pancreatic cancer.  Precautions  Precautions Fall  Restrictions  Weight Bearing Restrictions No  Home Living  Family/patient expects to be discharged to: Private residence  Living Arrangements Children  Available Help at Discharge Family  Type of [place] to enter  Entrance Stairs-Number of Steps 3  Entrance Stairs-Rails Left;Right  Home Layout One level  Bathroom Shower/Tub Tub/shower unit  [place] None  Prior Function  Prior Level of Function  Independent/Modified Independent  Communication  Communication HOH  Pain Assessment  Pain Assessment No/denies pain  Cognition  Arousal/Alertness Awake/alert  Behavior During Therapy WFL for tasks assessed/performed  Overall Cognitive Status Within Functional Limits for tasks assessed  Upper Extremity Assessment  Upper Extremity Assessment Defer to OT evaluation;Overall [facility] for tasks assessed  Lower Extremity Assessment  Lower Extremity Assessment Generalized weakness  Cervical / Trunk Assessment  Cervical / Trunk Assessment Kyphotic  Bed Mobility  Overal bed mobility Needs Assistance  Bed Mobility Supine to Sit  Supine to sit Supervision;HOB elevated  General bed mobility comments supervision for safety, pt taking extra time  Transfers  Overall transfer level Needs assistance  Equipment used None  Transfers  Sit to/from Stand  Sit to [employer] guard   General transfer comment guarding for safety, no assist to rise  Ambulation/Gait  Ambulation/Gait assistance Min guard  Gait Distance (Feet) 400 Feet  Assistive device None  Gait Pattern/deviations Step-to pattern  General Gait Details close min/contact guard assist for gait with pt declining use of IV pole to steady self. pt ambulated full loop with slight drift/sway throughout. no LOB noted.  Gait velocity decr  Balance  Overall balance assessment Mild deficits observed, not formally tested;Needs assistance  Sitting-balance support Feet supported  Sitting balance-Leahy Scale Good  Standing balance support During functional activity;No upper extremity supported  Standing balance-Leahy Scale Good  Standing balance comment pt mildly unsteady with gait  PT - End of Session  Equipment Utilized During Treatment Gait belt  Activity Tolerance Patient tolerated treatment well  Patient left in chair;with call bell/phone within reach;with nursing/sitter in room  Nurse Communication Mobility status  PT Assessment  PT Recommendation/Assessment Patient needs continued PT services  PT Visit Diagnosis Muscle weakness (generalized) (M62.81);Difficulty in walking, not elsewhere classified (R26.2);Unsteadiness on feet (R26.81)  PT Problem List Decreased strength;Decreased balance;Decreased mobility;Decreased activity tolerance;Decreased knowledge of use of DME;Decreased safety awareness  PT Plan  PT Frequency (ACUTE ONLY) Min 3X/week  PT Treatment/Interventions (ACUTE ONLY) DME instruction;Gait training;Stair training;Functional mobility training;Therapeutic activities;Therapeutic exercise;Balance training;Patient/family education  AM-PAC PT "6 Clicks" Mobility Outcome Measure (Version 2)  Help needed turning from your back to your side while in a flat bed without using bedrails? 3  Help needed moving from lying on your back to sitting on the side of a flat bed without using bedrails? 3  Help needed  moving to and from a bed to a chair (including a wheelchair)? 3  Help needed standing up from a chair using your arms (e.g., wheelchair or bedside chair)? 3  Help needed to walk in hospital room? 3  Help needed climbing 3-5 steps with a railing?  3  6 Click Score 18  Consider Recommendation of Discharge To: Home with [facility]  Progressive Mobility  What is the highest level of mobility based on the progressive mobility assessment? Level 5 (Walks with assist in room/hall) - Balance while stepping forward/back and can walk in room with assist - Complete  PT Recommendation  Follow Up Recommendations Home health PT  Assistance recommended at discharge Intermittent Supervision/Assistance  Functional Status Assessment Patient has had a recent decline in their functional status and demonstrates the ability to make significant improvements in function in a reasonable and predictable amount of time.  PT equipment Rolling walker (2 wheels) (TBA)  Individuals Consulted  Consulted and Agree with Results and Recommendations Patient  Acute Rehab PT Goals  Patient Stated Goal get home and stay independent  PT Goal Formulation With patient  Time For Goal Achievement 09/14/21  Potential to Achieve Goals Good  PT Time Calculation  PT Start Time (ACUTE ONLY) 0851  PT Stop Time (ACUTE ONLY) 0920  PT Time Calculation (min) (ACUTE ONLY) 29 min  PT General Charges  $$ ACUTE PT VISIT 1 Visit  PT Evaluation  $PT Eval Low Complexity 1 Low  PT Treatments  $Gait Training 8-22 mins  Written Expression  Dominant Hand Right    Verner Mould, DPT Acute Rehabilitation Services Office [phone] Pager [phone]   Jacques Navy 08/31/2021, 2:53 PM

## 2021-08-31 NOTE — Progress Notes (Addendum)
Initial Nutrition Assessment  DOCUMENTATION CODES:   Severe malnutrition in context of chronic illness  INTERVENTION:   -Provide "High Calorie, High Protein" handout in discharge instruction. Reviewed these principles with patient.  -Ensure Plus High Protein po TID, each supplement provides 350 kcal and 20 grams of protein.  -Prosource Plus PO BID, each provides 100 kcals and 15g protein  -Multivitamin with minerals daily  NUTRITION DIAGNOSIS:   Severe Malnutrition related to chronic illness, cancer and cancer related treatments as evidenced by percent weight loss, energy intake < or equal to 75% for > or equal to 1 month, severe fat depletion, severe muscle depletion.  GOAL:   Patient will meet greater than or equal to 90% of their needs  MONITOR:   PO intake, Supplement acceptance, Labs, Weight trends, I & O's  REASON FOR ASSESSMENT:   Malnutrition Screening Tool    ASSESSMENT:   81 y.o. male with medical history significant for pancreatic cancer, HLD, HTN, hypothyroidism who presents for evaluation of fever that started last night after having chemotherapy of Gemzar for his pancreatic cancer.  Patient in room, sitting in chair. Pt reports poor PO d/t getting full quickly. Can only eat small amounts at mealtimes. Tries to drink Ensure supplements but only completes them ~60%. Encouraged pt to try to consume small frequent meals. States he also has taste changes and a smell he can't get rid of. We discussed oral hygiene and mouth rinsing, as well as sucking on mints or sour candy. Denies any issues swallowing. States he is missing some teeth which makes it difficult to chew sometimes.   Pt last chemo treatment was 1/13. States since cancer diagnosis, he has lost 20-25 lbs. Per weight records, pt has lost 21 lbs (12% wt loss x 1 month, significant for time frame).   Medications: KLOR-CON  Labs reviewed: Low Na, K   NUTRITION - FOCUSED PHYSICAL EXAM:  Flowsheet Row  Most Recent Value  Orbital Region Severe depletion  Upper Arm Region Severe depletion  Thoracic and Lumbar Region Unable to assess  Buccal Region Severe depletion  Temple Region Moderate depletion  Clavicle Bone Region Severe depletion  Clavicle and Acromion Bone Region Severe depletion  Scapular Bone Region Severe depletion  Dorsal Hand Moderate depletion  Patellar Region Severe depletion  Anterior Thigh Region Severe depletion  Posterior Calf Region Severe depletion  Edema (RD Assessment) None  Hair Reviewed  Eyes Reviewed  Mouth Reviewed  Skin Reviewed       Diet Order:   Diet Order             DIET SOFT Room service appropriate? Yes; Fluid consistency: Thin  Diet effective now                   EDUCATION NEEDS:   Education needs have been addressed  Skin:  Skin Assessment: Reviewed RN Assessment  Last BM:  1/17- type 5  Height:   Ht Readings from Last 1 Encounters:  08/28/21 5\' 9"  (1.753 m)    Weight:   Wt Readings from Last 1 Encounters:  08/28/21 70.3 kg    BMI:  Body mass index is 22.89 kg/m.  Estimated Nutritional Needs:   Kcal:  2150-2350  Protein:  105-115g  Fluid:  2.1L/day  Clayton Bibles, MS, RD, LDN Inpatient Clinical Dietitian Contact information available via Amion

## 2021-08-31 NOTE — Progress Notes (Signed)
°   08/31/21 1420  Vitals  Patient Position (if appropriate) Orthostatic Vitals  Orthostatic Lying   BP- Lying 134/71  Pulse- Lying 66  Orthostatic Sitting  BP- Sitting 113/76  Pulse- Sitting 79  Orthostatic Standing at 0 minutes  BP- Standing at 0 minutes 125/74  Pulse- Standing at 0 minutes 84  Orthostatic Standing at 3 minutes  BP- Standing at 3 minutes 130/76  Pulse- Standing at 3 minutes [place], RN

## 2021-09-01 ENCOUNTER — Other Ambulatory Visit: Payer: Self-pay | Admitting: Licensed Clinical Social Worker

## 2021-09-01 DIAGNOSIS — C251 Malignant neoplasm of body of pancreas: Secondary | ICD-10-CM

## 2021-09-02 LAB — CULTURE, BLOOD (ROUTINE X 2)
Culture: NO GROWTH
Special Requests: ADEQUATE
Special Requests: ADEQUATE

## 2021-09-03 LAB — CULTURE, BLOOD (ROUTINE X 2)
Culture: NO GROWTH
Culture: NO GROWTH
Special Requests: ADEQUATE
Special Requests: ADEQUATE

## 2021-09-06 ENCOUNTER — Inpatient Hospital Stay (HOSPITAL_BASED_OUTPATIENT_CLINIC_OR_DEPARTMENT_OTHER): Payer: Medicare HMO | Admitting: Hematology

## 2021-09-06 ENCOUNTER — Inpatient Hospital Stay (HOSPITAL_BASED_OUTPATIENT_CLINIC_OR_DEPARTMENT_OTHER): Payer: Medicare HMO | Admitting: Licensed Clinical Social Worker

## 2021-09-06 ENCOUNTER — Inpatient Hospital Stay: Payer: Medicare HMO

## 2021-09-06 ENCOUNTER — Telehealth: Payer: Self-pay | Admitting: Hematology

## 2021-09-06 ENCOUNTER — Other Ambulatory Visit: Payer: Self-pay

## 2021-09-06 ENCOUNTER — Inpatient Hospital Stay: Payer: Medicare HMO | Admitting: Nurse Practitioner

## 2021-09-06 ENCOUNTER — Encounter: Payer: Self-pay | Admitting: Licensed Clinical Social Worker

## 2021-09-06 VITALS — BP 145/74 | HR 53 | Temp 98.0°F | Resp 15 | Wt 148.9 lb

## 2021-09-06 DIAGNOSIS — Z95828 Presence of other vascular implants and grafts: Secondary | ICD-10-CM

## 2021-09-06 DIAGNOSIS — C251 Malignant neoplasm of body of pancreas: Secondary | ICD-10-CM

## 2021-09-06 DIAGNOSIS — Z803 Family history of malignant neoplasm of breast: Secondary | ICD-10-CM | POA: Insufficient documentation

## 2021-09-06 DIAGNOSIS — C250 Malignant neoplasm of head of pancreas: Secondary | ICD-10-CM

## 2021-09-06 DIAGNOSIS — Z79899 Other long term (current) drug therapy: Secondary | ICD-10-CM | POA: Diagnosis not present

## 2021-09-06 DIAGNOSIS — Z5111 Encounter for antineoplastic chemotherapy: Secondary | ICD-10-CM | POA: Diagnosis present

## 2021-09-06 DIAGNOSIS — Z801 Family history of malignant neoplasm of trachea, bronchus and lung: Secondary | ICD-10-CM | POA: Insufficient documentation

## 2021-09-06 LAB — CBC WITH DIFFERENTIAL (CANCER CENTER ONLY)
Abs Immature Granulocytes: 0.13 10*3/uL — ABNORMAL HIGH (ref 0.00–0.07)
Basophils Absolute: 0 10*3/uL (ref 0.0–0.1)
Basophils Relative: 0 %
Eosinophils Absolute: 0.1 10*3/uL (ref 0.0–0.5)
Eosinophils Relative: 3 %
HCT: 30.7 % — ABNORMAL LOW (ref 39.0–52.0)
Hemoglobin: 10.3 g/dL — ABNORMAL LOW (ref 13.0–17.0)
Immature Granulocytes: 3 %
Lymphocytes Relative: 37 %
Lymphs Abs: 1.8 10*3/uL (ref 0.7–4.0)
MCH: 27.3 pg (ref 26.0–34.0)
MCHC: 33.6 g/dL (ref 30.0–36.0)
MCV: 81.4 fL (ref 80.0–100.0)
Monocytes Absolute: 0.7 10*3/uL (ref 0.1–1.0)
Monocytes Relative: 14 %
Neutro Abs: 2 10*3/uL (ref 1.7–7.7)
Neutrophils Relative %: 43 %
Platelet Count: 252 10*3/uL (ref 150–400)
RBC: 3.77 MIL/uL — ABNORMAL LOW (ref 4.22–5.81)
RDW: 14.5 % (ref 11.5–15.5)
WBC Count: 4.7 10*3/uL (ref 4.0–10.5)
nRBC: 0 % (ref 0.0–0.2)

## 2021-09-06 LAB — CMP (CANCER CENTER ONLY)
ALT: 70 U/L — ABNORMAL HIGH (ref 0–44)
AST: 80 U/L — ABNORMAL HIGH (ref 15–41)
Albumin: 3.4 g/dL — ABNORMAL LOW (ref 3.5–5.0)
Alkaline Phosphatase: 201 U/L — ABNORMAL HIGH (ref 38–126)
Anion gap: 9 (ref 5–15)
BUN: 17 mg/dL (ref 8–23)
CO2: 27 mmol/L (ref 22–32)
Calcium: 8.9 mg/dL (ref 8.9–10.3)
Chloride: 101 mmol/L (ref 98–111)
Creatinine: 1.14 mg/dL (ref 0.61–1.24)
GFR, Estimated: >60 mL/min (ref >60)
Glucose, Bld: 117 mg/dL — ABNORMAL HIGH (ref 70–99)
Potassium: 4 mmol/L (ref 3.5–5.1)
Sodium: 137 mmol/L (ref 135–145)
Total Bilirubin: 1.4 mg/dL — ABNORMAL HIGH (ref 0.3–1.2)
Total Protein: 6.5 g/dL (ref 6.5–8.1)

## 2021-09-06 LAB — GENETIC SCREENING ORDER

## 2021-09-06 MED ORDER — SODIUM CHLORIDE 0.9% FLUSH
10.0000 mL | Freq: Once | INTRAVENOUS | Status: AC
  Administered 2021-09-06: 10 mL

## 2021-09-06 NOTE — Telephone Encounter (Signed)
Left message with cancelled upcoming appointment per 1/23 los.

## 2021-09-06 NOTE — Progress Notes (Signed)
[place]   Telephone:([phone] [phone]   Clinic Follow up Note   Patient Care Team: Ronnell Freshwater, NP as PCP - General (Family Medicine) Truitt Merle, MD as Consulting Physician (Oncology) Royston Bake, RN as Oncology Nurse Navigator (Oncology)  Date of Service:  09/06/2021  CHIEF COMPLAINT: f/u of pancreatic cancer  CURRENT THERAPY:  Neoadjuvant chemotherapy gemcitabine and Abraxane on day 1, 8 every 21 days, started with gemcitabine alone on 08/20/21 for first cycle due to hyperbilirubinemia.  ASSESSMENT & PLAN:  Angel Keller. is a 81 y.o. male with   1. 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 -Pancreatic protocol CT scan showed 3.6 x 2.6 cm pancreatic head mass, partially involving multiple vessels, including SMV, portal vein, and hepatic artery.  This is borderline resectable. -Patient was evaluated by Dr. Zenia Resides, who recommends neoadjuvant chemotherapy, which I agree. -he began gemcitabine alone on 08/20/21. He tolerated poorly overall; he presents today with fatigue, SOB, headache, leg swelling, and rash. He was admitted 1/14-1/17/23 for fever and bacteriemia. We will push this week's treatment back a week.   2. Epigastric pain and obstructive jaundice -Status post PTC, currently not draining. We will arrange f/u with IR. -T bili improving, 2.2 on 08/20/21 -he reports his urine color is back to normal, but his sclera is still yellowed today    3. Leg edema and dyspnea -probably fluid overload from his recent hospital admission -he is going to see his PCP today for f/u after hospital discharge, probably will get diuretics, will defer to his PCP   PLAN -cancel treatment on 1/26 -reschedule lab, flush, f/u, and C2 gem/abraxane to 2/3     No problem-specific Assessment & Plan notes found for this encounter.   SUMMARY OF ONCOLOGIC HISTORY: Oncology History Overview Note   Cancer Staging  Pancreatic cancer [facility]) Staging form: Exocrine Pancreas, AJCC 8th Edition - Clinical stage from 08/05/2021: Stage IB (cT2, cN0, cM0) - Signed by Truitt Merle, MD on 08/17/2021 Stage prefix: Initial diagnosis Total positive nodes: 0     Pancreatic mass  07/18/2021 Initial Diagnosis   Pancreatic mass   07/18/2021 Imaging   CT Abdomen Pelvis W Contrast  IMPRESSION: 1. Suspected solid mass within the proximal body of the pancreas measures 2.3 x 2.6 x 2.0 cm. There is downstream dilation of the main pancreatic duct and its branches. There is also intra and extrahepatic biliary ductal dilation. Further evaluation with MRI of the abdomen, pancreatic protocol, when clinically feasible, may be considered. 2. 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 3. Aortic atherosclerosis   07/20/2021 Procedure   DG ERCP  IMPRESSION: Nondiagnostic ERCP as above. Correlation with the operative report is advised.  - The examination was suspicious for a biliary stricture in the bile duct. - Examination was suspicious for carcinoma of the head of the pancreas. - Attempts at a cholangiogram failed.   07/22/2021 Pathology Results   CASE: [id number]   FINAL MICROSCOPIC DIAGNOSIS:  - Suspicious for malignancy  - See comment   DIAGNOSTIC COMMENTS:  There are rare cells with cytologic atypia suspicious for  adenocarcinoma.  Dr. Vic Ripper agrees.    07/22/2021 Procedure   IR INT EXT BILIARY DRAIN WITH CHOLANGIOGRAM ( IMPRESSION: 1. 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 2. Successful brush  biopsy of obstructing lesion x3. 3. Successful placement of a 10 French internal/external biliary drainage catheter.   PLAN: 1. Follow bilirubin. When significant downward  trend is clear, the bag should be capped. Recommend capping bag before discharge home if possible. 2. Follow-up in IR in 4-6 weeks for initial biliary tube check and exchange. If initial brush biopsies are negative, repeat biopsy could be considered at that time.   Pancreatic cancer ([place])  08/05/2021 Cancer Staging   Staging form: Exocrine Pancreas, AJCC 8th Edition - Clinical stage from 08/05/2021: Stage IB (cT2, cN0, cM0) - Signed by Truitt Merle, MD on 08/17/2021 Stage prefix: Initial diagnosis Total positive nodes: 0    08/14/2021 Initial Diagnosis   Pancreatic cancer ([place])   08/20/2021 -  Chemotherapy   Patient is on Treatment Plan : PANCREATIC Abraxane / Gemcitabine D1,8,15 q28d        INTERVAL HISTORY:  Angel Celeste Sr. is here for a follow up of pancreatic cancer. He was last seen by me on 08/27/21. He presents to the clinic accompanied by his family. She reports SOB, headache, rash, leg swelling.   All other systems were reviewed with the patient and are negative.  MEDICAL HISTORY:  Past Medical History:  Diagnosis Date   Aneurysm ([place])    2017   Anxiety    Arthritis    Depression    Family history of breast cancer    Family history of lung cancer    Headache    HLD (hyperlipidemia)    Hx of inguinal hernia repair    Hypertension    Hypothyroidism    Overactive bladder    Pancreatic cancer (H[facility])    Pneumonia    Shingles    Sleep apnea    Patient denies   Thoracic ascending aortic aneurysm    mildly dilated 4.0cm per 08/12/21 CT   Thyroid disease    Varicose veins of both lower extremities     SURGICAL HISTORY: Past Surgical History:  Procedure Laterality Date   BILATERAL CARPAL TUNNEL RELEASE     CATARACT EXTRACTION, BILATERAL Bilateral    ELBOW SURGERY Bilateral    ENDOSCOPIC RETROGRADE CHOLANGIOPANCREATOGRAPHY (ERCP) WITH PROPOFOL N/A 07/20/2021   Procedure: ENDOSCOPIC RETROGRADE CHOLANGIOPANCREATOGRAPHY (ERCP) WITH PROPOFOL;  Surgeon: Gatha Mayer, MD;  Location: [facility] ENDOSCOPY;  Service: Endoscopy;  Laterality: N/A;   ESOPHAGOGASTRODUODENOSCOPY (EGD) WITH PROPOFOL N/A 08/05/2021   Procedure: ESOPHAGOGASTRODUODENOSCOPY (EGD) WITH PROPOFOL;  Surgeon: Rush Landmark Telford Nab., MD;  Location: [place];  Service: Gastroenterology;  Laterality: N/A;   EUS N/A 08/05/2021   Procedure: UPPER ENDOSCOPIC ULTRASOUND (EUS) RADIAL;  Surgeon: Irving Copas., MD;  Location: [place];  Service: Gastroenterology;  Laterality: N/A;   FINE NEEDLE ASPIRATION  08/05/2021   Procedure: FINE NEEDLE ASPIRATION (FNA) LINEAR;  Surgeon: Irving Copas., MD;  Location: [place];  Service: Gastroenterology;;   INGUINAL HERNIA REPAIR Right 1952   IR ANGIO INTRA EXTRACRAN SEL COM CAROTID INNOMINATE BILAT MOD SED  02/20/2019   IR ENDOLUMINAL BX OF BILIARY TREE  07/22/2021   IR GENERIC HISTORICAL  10/28/2016   IR RADIOLOGIST EVAL & MGMT 10/28/2016 [facility]-INTERV RAD   IR INT EXT BILIARY DRAIN WITH CHOLANGIOGRAM  07/22/2021   PORTACATH PLACEMENT N/A 08/19/2021   Procedure: INSERTION PORT-A-CATH;  Surgeon: Dwan Bolt, MD;  Location: [facility] ORS;  Service: General;  Laterality: N/A;  LMA   RADIOLOGY WITH ANESTHESIA N/A 10/21/2015   Procedure: RADIOLOGY WITH ANESTHESIA;  Surgeon: Luanne Bras, MD;  Location: [facility]  OR;  Service: Radiology;  Laterality: N/A;   RADIOLOGY WITH ANESTHESIA N/A 02/20/2019   Procedure: [employer];  Surgeon: Luanne Bras, MD;  Location: [place];  Service: Radiology;  Laterality: N/A;   ROTATOR CUFF REPAIR Bilateral    VARICOSE VEIN SURGERY      I have reviewed the social history and family history with the patient and they are unchanged from previous note.  ALLERGIES:  has No Known Allergies.  MEDICATIONS:  Current Outpatient Medications  Medication Sig Dispense Refill   apixaban (ELIQUIS) 5 MG TABS tablet Take 1 tablet (5 mg total) by mouth 2 (two) times daily. 60 tablet 0   aspirin EC 81 MG tablet Take 1 tablet (81 mg  total) by mouth daily. Hold while on blood thinner/eliquis for 3 months, then resume (Patient taking differently: Take 81 mg by mouth daily.) 30 tablet 11   bisoprolol-hydrochlorothiazide (ZIAC) 2.5-6.25 MG tablet Take 1 tablet by mouth every morning.     Cholecalciferol (VITAMIN D3) 50 MCG (2000 UT) TABS Take 2,000 Units by mouth daily.     diltiazem (CARDIZEM) 30 MG tablet Take 1 tablet (30 mg total) by mouth every 8 (eight) hours. 90 tablet 0   escitalopram (LEXAPRO) 20 MG tablet Take 20 mg by mouth daily.     famotidine (PEPCID) 20 MG tablet Take 1 tablet (20 mg total) by mouth 2 (two) times daily. 60 tablet 1   feeding supplement (ENSURE ENLIVE / ENSURE PLUS) LIQD Take 237 mLs by mouth 3 (three) times daily between meals. 237 mL 12   mirtazapine (REMERON) 7.5 MG tablet Take 1 tablet (7.5 mg total) by mouth at bedtime. 90 tablet 0   Multiple Vitamin (MULTIVITAMIN WITH MINERALS) TABS tablet Take 1 tablet by mouth daily. 30 tablet 0   Nutritional Supplements (,FEEDING SUPPLEMENT, PROSOURCE PLUS) liquid Take 30 mLs by mouth 2 (two) times daily between meals. 887 mL ML   oxyCODONE (OXY IR/ROXICODONE) 5 MG immediate release tablet Take 1 tablet (5 mg total) by mouth every 6 (six) hours as needed for moderate pain. 10 tablet 0   pantoprazole (PROTONIX) 40 MG tablet Take 1 tablet (40 mg total) by mouth daily. 30 tablet 2   prochlorperazine (COMPAZINE) 10 MG tablet Take 1 tablet (10 mg total) by mouth every 6 (six) hours as needed (Nausea or vomiting). (Patient not taking: Reported on 08/28/2021) 30 tablet 1   Sodium Chloride Flush (NORMAL SALINE FLUSH) 0.9 % SOLN Flush tube once daily with 1 syringe. 300 mL 0   sucralfate (CARAFATE) 1 GM/10ML suspension Take 10 mLs (1 g total) by mouth 2 (two) times daily. (Patient not taking: Reported on 08/28/2021) 420 mL 2   SYNTHROID 112 MCG tablet Take 1 tablet (112 mcg total) by mouth daily before breakfast. 90 tablet 1   vitamin B-12 (CYANOCOBALAMIN) 1000 MCG  tablet Take 1,000 mcg by mouth 2 (two) times daily.     No current facility-administered medications for this visit.    PHYSICAL EXAMINATION: ECOG PERFORMANCE STATUS: 3 - Symptomatic, >50% confined to bed  Vitals:   09/06/21 1202  BP: (!) 145/74  Pulse: (!) 53  Resp: 15  Temp: 98 F (36.7 C)  SpO2: 97%   Wt Readings from Last 3 Encounters:  09/06/21 148 lb 14.4 oz (67.5 kg)  08/28/21 154 lb 15.7 oz (70.3 kg)  08/27/21 146 lb 9 oz (66.5 kg)     GENERAL:alert, no distress and comfortable SKIN: skin color, texture, turgor are normal, no significant lesions, (+)  rash on back, appears to be drug-related EYES: normal, Conjunctiva are pink and non-injected, sclera clear LUNGS: clear to auscultation and percussion with normal breathing effort HEART: regular rate & rhythm and no murmurs, (+) lower extremity edema NEURO: alert & oriented x 3 with fluent speech, no focal motor/sensory deficits  LABORATORY DATA:  I have reviewed the data as listed CBC Latest Ref Rng & Units 09/06/2021 08/31/2021 08/30/2021  WBC 4.0 - 10.5 K/uL 4.7 4.0 7.8  Hemoglobin 13.0 - 17.0 g/dL 10.3(L) 9.9(L) 10.6(L)  Hematocrit 39.0 - 52.0 % 30.7(L) 29.2(L) 32.2(L)  Platelets 150 - 400 K/uL 252 94(L) 94(L)     CMP Latest Ref Rng & Units 09/06/2021 08/31/2021 08/30/2021  Glucose 70 - 99 mg/dL 117(H) 104(H) 128(H)  BUN 8 - 23 mg/dL '17 13 13  ' Creatinine 0.61 - 1.24 mg/dL 1.14 0.89 1.02  Sodium 135 - 145 mmol/L 137 133(L) 136  Potassium 3.5 - 5.1 mmol/L 4.0 3.3(L) 4.4  Chloride 98 - 111 mmol/L 101 102 104  CO2 22 - 32 mmol/L '27 26 26  ' Calcium 8.9 - 10.3 mg/dL 8.9 7.7(L) 8.0(L)  Total Protein 6.5 - 8.1 g/dL 6.5 - -  Total Bilirubin 0.3 - 1.2 mg/dL 1.4(H) - -  Alkaline Phos 38 - 126 U/L 201(H) - -  AST 15 - 41 U/L 80(H) - -  ALT 0 - 44 U/L 70(H) - -      RADIOGRAPHIC STUDIES: I have personally reviewed the radiological images as listed and agreed with the findings in the report. No results found.    No  orders of the defined types were placed in this encounter.  All questions were answered. The patient knows to call the clinic with any problems, questions or concerns. No barriers to learning was detected. The total time spent in the appointment was 25 minutes.     Truitt Merle, MD 09/06/2021   I, Wilburn Mylar, am acting as scribe for Truitt Merle, MD.   I have reviewed the above documentation for accuracy and completeness, and I agree with the above.

## 2021-09-06 NOTE — Progress Notes (Signed)
REFERRING PROVIDER: Truitt Merle, MD [place],  [place] [address]  PRIMARY PROVIDER:  Ronnell Freshwater, NP  PRIMARY REASON FOR VISIT:  1. Malignant neoplasm of body of pancreas ([place])   2. Family history of breast cancer   3. Family history of lung cancer      HISTORY OF PRESENT ILLNESS:   Angel Keller, a 81 y.o. male, was seen for a [place] cancer genetics consultation at the request of Dr. Burr Medico due to a personal and family history of cancer.  Angel Keller presents to clinic today to discuss the possibility of a hereditary predisposition to cancer, genetic testing, and to further clarify his future cancer risks, as well as potential cancer risks for family members.   In 2022, at the age of 58, Angel Keller was diagnosed with pancreatic cancer.  This is currently being treated with neoadjuvant chemotherapy.  He reports that he did have colonoscopies a long time ago and he does not recall any polyps being identified.   CANCER HISTORY:  Oncology History Overview Note   Cancer Staging  Pancreatic cancer [facility]) Staging form: Exocrine Pancreas, AJCC 8th Edition - Clinical stage from 08/05/2021: Stage IB (cT2, cN0, cM0) - Signed by Truitt Merle, MD on 08/17/2021 Stage prefix: Initial diagnosis Total positive nodes: 0     Pancreatic mass  07/18/2021 Initial Diagnosis   Pancreatic mass   07/18/2021 Imaging   CT Abdomen Pelvis W Contrast  IMPRESSION: 1. Suspected solid mass within the proximal body of the pancreas measures 2.3 x 2.6 x 2.0 cm. There is downstream dilation of the main pancreatic duct and its branches. There is also intra and extrahepatic biliary ductal dilation. Further evaluation with MRI of the abdomen, pancreatic protocol, when clinically feasible, may be considered. 2. 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 3. Aortic atherosclerosis   07/20/2021 Procedure   DG ERCP  IMPRESSION: Nondiagnostic ERCP as above. Correlation with the  operative report is advised.  - The examination was suspicious for a biliary stricture in the bile duct. - Examination was suspicious for carcinoma of the head of the pancreas. - Attempts at a cholangiogram failed.   07/22/2021 Pathology Results   CASE: [id number]   FINAL MICROSCOPIC DIAGNOSIS:  - Suspicious for malignancy  - See comment   DIAGNOSTIC COMMENTS:  There are rare cells with cytologic atypia suspicious for  adenocarcinoma.  Dr. Vic Ripper agrees.    07/22/2021 Procedure   IR INT EXT BILIARY DRAIN WITH CHOLANGIOGRAM ( IMPRESSION: 1. 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 2. Successful brush biopsy of obstructing lesion x3. 3. Successful placement of a 10 French internal/external biliary drainage catheter.   PLAN: 1. Follow bilirubin. When significant downward trend is clear, the bag should be capped. Recommend capping bag before discharge home if possible. 2. Follow-up in IR in 4-6 weeks for initial biliary tube check and exchange. If initial brush biopsies are negative, repeat biopsy could be considered at that time.   Pancreatic cancer ([place])  08/05/2021 Cancer Staging   Staging form: Exocrine Pancreas, AJCC 8th Edition - Clinical stage from 08/05/2021: Stage IB (cT2, cN0, cM0) - Signed by Truitt Merle, MD on 08/17/2021 Stage prefix: Initial diagnosis Total positive nodes: 0    08/14/2021 Initial Diagnosis   Pancreatic cancer ([place])   08/20/2021 -  Chemotherapy   Patient is on Treatment Plan : PANCREATIC Abraxane / Gemcitabine D1,8,15 q28d       Past Medical History:  Diagnosis Date   Aneurysm ([place])    2017   Anxiety    Arthritis    Depression    Family history of breast cancer    Family history of lung cancer    Headache    HLD (hyperlipidemia)    Hx of inguinal hernia repair    Hypertension    Hypothyroidism    Overactive bladder    Pancreatic cancer ([place])    Pneumonia    Shingles     Sleep apnea    Patient denies   Thoracic ascending aortic aneurysm    mildly dilated 4.0cm per 08/12/21 CT   Thyroid disease    Varicose veins of both lower extremities     Past Surgical History:  Procedure Laterality Date   BILATERAL CARPAL TUNNEL RELEASE     CATARACT EXTRACTION, BILATERAL Bilateral    ELBOW SURGERY Bilateral    ENDOSCOPIC RETROGRADE CHOLANGIOPANCREATOGRAPHY (ERCP) WITH PROPOFOL N/A 07/20/2021   Procedure: ENDOSCOPIC RETROGRADE CHOLANGIOPANCREATOGRAPHY (ERCP) WITH PROPOFOL;  Surgeon: Gatha Mayer, MD;  Location: [facility] ENDOSCOPY;  Service: Endoscopy;  Laterality: N/A;   ESOPHAGOGASTRODUODENOSCOPY (EGD) WITH PROPOFOL N/A 08/05/2021   Procedure: ESOPHAGOGASTRODUODENOSCOPY (EGD) WITH PROPOFOL;  Surgeon: Rush Landmark Telford Nab., MD;  Location: [place];  Service: Gastroenterology;  Laterality: N/A;   EUS N/A 08/05/2021   Procedure: UPPER ENDOSCOPIC ULTRASOUND (EUS) RADIAL;  Surgeon: Irving Copas., MD;  Location: [place];  Service: Gastroenterology;  Laterality: N/A;   FINE NEEDLE ASPIRATION  08/05/2021   Procedure: FINE NEEDLE ASPIRATION (FNA) LINEAR;  Surgeon: Irving Copas., MD;  Location: [place];  Service: Gastroenterology;;   INGUINAL HERNIA REPAIR Right 1951   IR ANGIO INTRA EXTRACRAN SEL COM CAROTID INNOMINATE BILAT MOD SED  02/20/2019   IR ENDOLUMINAL BX OF BILIARY TREE  07/22/2021   IR GENERIC HISTORICAL  10/28/2016   IR RADIOLOGIST EVAL & MGMT 10/28/2016 [facility]   IR INT EXT BILIARY DRAIN WITH CHOLANGIOGRAM  07/22/2021   PORTACATH PLACEMENT N/A 08/19/2021   Procedure: INSERTION PORT-A-CATH;  Surgeon: Dwan Bolt, MD;  Location: [facility];  Service: General;  Laterality: N/A;  LMA   RADIOLOGY WITH ANESTHESIA N/A 10/21/2015   Procedure: RADIOLOGY WITH ANESTHESIA;  Surgeon: Luanne Bras, MD;  Location: [place];  Service: Radiology;  Laterality: N/A;   RADIOLOGY WITH ANESTHESIA N/A 02/20/2019   Procedure: [employer];  Surgeon:  Luanne Bras, MD;  Location: [place];  Service: Radiology;  Laterality: N/A;   ROTATOR CUFF REPAIR Bilateral    VARICOSE VEIN SURGERY      Social History   Socioeconomic History   Marital status: Widowed    Spouse name: Not on file   Number of children: 5   Years of education: Not on file   Highest education level: Not on file  Occupational History   Occupation: retired  Tobacco Use   Smoking status: Former    Packs/day: 0.25    Years: 1.00    Pack years: 0.25    Types: Cigarettes    Quit date: 08/15/1961    Years since quitting: 60.1   Smokeless tobacco: Never  Vaping Use   Vaping Use: Never used  Substance and Sexual Activity   Alcohol use: No   Drug use: No   Sexual activity: Not Currently  Other Topics Concern   Not on file  Social History Narrative   Not on file   Social Determinants of Health   Financial Resource Strain: Not on file  Food Insecurity: Not on file  Transportation Needs: Not on file  Physical Activity: Not on file  Stress: Not on file  Social Connections: Not on file     FAMILY HISTORY:  We obtained a detailed, 4-generation family history.  Significant diagnoses are listed below: Family History  Problem Relation Age of Onset   Breast cancer Mother    Lung cancer Father    Alcoholism Sister    Cirrhosis Sister    Colon cancer Neg Hx    Esophageal cancer Neg Hx    Liver cancer Neg Hx    Pancreatic cancer Neg Hx    Prostate cancer Neg Hx    Angel Keller had 3 daughters and 2 sons. One daughter passed in an accident at 65. One of his other daughter's, Angel Keller, is here with patient today. No cancers for her or her siblings. Angel Keller has 1 sister who died at 56 of cirrhosis of the liver. She had 1 son who is no longer in contact with the family, so patient is unaware of any cancers/health issues for him.  Angel Keller mother died of breast cancer at 73. Patient had 4 maternal aunts, 5 uncles, he is unaware of cancers for any of them or for his  cousins. Maternal grandmother died over age 37, as did his grandfather.  Angel Keller father died in his 32s and had history of tuberculosis and lung cancer, history of smoking. He was adopted and had 1 brother who was also adopted. Patient had no information about other paternal relatives because of this.  Angel Keller is unaware of previous family history of genetic testing for hereditary cancer risks. Patient's maternal ancestors are of Irish/English descent, and paternal ancestors are of unknown/possibly American [place] descent. There is no reported Ashkenazi Jewish ancestry. There is no known consanguinity.    GENETIC COUNSELING ASSESSMENT: Angel Keller is a 81 y.o. male with a personal and family history of cancer which is somewhat suggestive of a hereditary cancer syndrome and predisposition to cancer. We, therefore, discussed and recommended the following at today's visit.   DISCUSSION: We discussed that approximately 10-20% of pancreatic cancer is hereditary. Most cases of hereditary pancreatic cancer are associated with BRCA1/BRCA2 genes, although there are other genes associated with hereditary cancer as well. Cancers and risks are gene specific.  We discussed that testing is beneficial for several reasons including knowing about other cancer risks, identifying potential screening and risk-reduction options that may be appropriate, and to understand if other family members could be at risk for cancer and allow them to undergo genetic testing.   We reviewed the characteristics, features and inheritance patterns of hereditary cancer syndromes. We also discussed genetic testing, including the appropriate family members to test, the process of testing, insurance coverage and turn-around-time for results. We discussed the implications of a negative, positive and/or variant of uncertain significant result. We recommended Angel Keller pursue genetic testing for the [facility] Multi-Cancer+RNA gene panel.   The  Multi-Cancer Panel + RNA offered by [facility] includes sequencing and/or deletion duplication testing of the following 84 genes: AIP, ALK, APC, ATM, AXIN2,BAP1,  BARD1, BLM, BMPR1A, BRCA1, BRCA2, BRIP1, CASR, CDC73, CDH1, CDK4, CDKN1B, CDKN1C, CDKN2A (p14ARF), CDKN2A (p16INK4a), CEBPA, CHEK2, CTNNA1, DICER1, DIS3L2, EGFR (c.2369C>T, p.Thr790Met variant only), EPCAM (Deletion/duplication testing only), FH, FLCN, GATA2, GPC3, GREM1 (Promoter region deletion/duplication testing only), HOXB13 (c.251G>A, p.Gly84Glu), HRAS, KIT, MAX, MEN1, MET, MITF (c.952G>A, p.Glu318Lys variant only), MLH1, MSH2, MSH3, MSH6, MUTYH, NBN, NF1, NF2, NTHL1, PALB2, PDGFRA, PHOX2B, PMS2, POLD1, POLE, POT1, PRKAR1A, PTCH1, PTEN,  RAD50, RAD51C, RAD51D, RB1, RECQL4, RET, RUNX1, SDHAF2, SDHA (sequence changes only), SDHB, SDHC, SDHD, SMAD4, SMARCA4, SMARCB1, SMARCE1, STK11, SUFU, TERC, TERT, TMEM127, TP53, TSC1, TSC2, VHL, WRN and WT1.  Based on Angel Keller personal and family history of cancer, he meets medical criteria for genetic testing. Despite that he meets criteria, he may still have an out of pocket cost. We discussed that if his out of pocket cost for testing is over $100, the laboratory will call and confirm whether he wants to proceed with testing.  If the out of pocket cost of testing is less than $100 he will be billed by the genetic testing laboratory.   PLAN: After considering the risks, benefits, and limitations, Angel Keller provided informed consent to pursue genetic testing and the blood sample was sent to [facility] for analysis of the Multi-Cancer+RNA panel. Results should be available within approximately 2-3 weeks' time, at which point they will be disclosed by telephone to Angel Keller, as will any additional recommendations warranted by these results. Angel Keller will receive a summary of his genetic counseling visit and a copy of his results once available. This information will also be available in Epic.   Mr.  Keller questions were answered to his satisfaction today. Our contact information was provided should additional questions or concerns arise. Thank you for the referral and allowing [place] to share in the care of your patient.   Faith Rogue, MS, [facility] Genetic Counselor [email or web address] Phone: [phone]  The patient was seen for a total of 25 minutes in face-to-face genetic counseling.  Patient was seen with his daughter, Angel Keller. Dr. Grayland Ormond was available for discussion regarding this case.   _______________________________________________________________________ For Office Staff:  Number of people involved in session: 2 Was an Intern/ student involved with case: no

## 2021-09-07 ENCOUNTER — Ambulatory Visit (INDEPENDENT_AMBULATORY_CARE_PROVIDER_SITE_OTHER): Payer: Medicare HMO | Admitting: Nurse Practitioner

## 2021-09-07 ENCOUNTER — Encounter: Payer: Self-pay | Admitting: Hematology

## 2021-09-07 ENCOUNTER — Encounter: Payer: Self-pay | Admitting: Nurse Practitioner

## 2021-09-07 VITALS — BP 117/69 | HR 55 | Temp 97.5°F | Ht 69.0 in | Wt 146.0 lb

## 2021-09-07 DIAGNOSIS — C251 Malignant neoplasm of body of pancreas: Secondary | ICD-10-CM

## 2021-09-07 DIAGNOSIS — I480 Paroxysmal atrial fibrillation: Secondary | ICD-10-CM

## 2021-09-07 DIAGNOSIS — Z09 Encounter for follow-up examination after completed treatment for conditions other than malignant neoplasm: Secondary | ICD-10-CM

## 2021-09-07 DIAGNOSIS — R600 Localized edema: Secondary | ICD-10-CM | POA: Diagnosis not present

## 2021-09-07 LAB — CANCER ANTIGEN 19-9: CA 19-9: 115 U/mL — ABNORMAL HIGH (ref 0–35)

## 2021-09-07 MED ORDER — HYDROCHLOROTHIAZIDE 12.5 MG PO TABS: ORAL_TABLET | ORAL | 1 refills | Status: DC

## 2021-09-07 NOTE — Progress Notes (Signed)
Established Patient Office Visit  Subjective:  Patient ID: Angel Keller., male    DOB: 1941/08/14  Age: 81 y.o. MRN: [id number]  CC:  Chief Complaint  Patient presents with   Hospitalization Follow-up    HPI [name] presents for hospitalization follow up. This is transitional care note. His daughter called 911 when patent became very weak after receiving chemotherapy. He had developed a fever. Got as high as 101 prior to getting into ER. He was admitted into ICU for a few days. He developed atrial fibrillation. Was started on eliquis and diltiazem. His chest x-ray was negative. Blood cultures were positive for streptococcus. Urine culture was also positive for infection. Patient states that he was discharged with amoxicillin which he continues to take. Was not referred to cardiology upon discharge from hospital  Today, patient feels tired and weak. States that feet are swollen. Getting short winded with short walks, just from one room to the next. He did follow up with his oncologist yesterday and he is having a break from chemotherapy this week.   Past Medical History:  Diagnosis Date   Aneurysm ([place])    2017   Anxiety    Arthritis    Depression    Family history of breast cancer    Family history of lung cancer    Headache    HLD (hyperlipidemia)    Hx of inguinal hernia repair    Hypertension    Hypothyroidism    Overactive bladder    Pancreatic cancer (HCC)    Pneumonia    Shingles    Sleep apnea    Patient denies   Thoracic ascending aortic aneurysm    mildly dilated 4.0cm per 08/12/21 CT   Thyroid disease    Varicose veins of both lower extremities     Past Surgical History:  Procedure Laterality Date   BILATERAL CARPAL TUNNEL RELEASE     CATARACT EXTRACTION, BILATERAL Bilateral    ELBOW SURGERY Bilateral    ENDOSCOPIC RETROGRADE CHOLANGIOPANCREATOGRAPHY (ERCP) WITH PROPOFOL N/A 07/20/2021   Procedure: ENDOSCOPIC RETROGRADE CHOLANGIOPANCREATOGRAPHY  (ERCP) WITH PROPOFOL;  Surgeon: Gatha Mayer, MD;  [facility] ENDOSCOPY;  Service: Endoscopy;  Laterality: N/A;   ESOPHAGOGASTRODUODENOSCOPY (EGD) WITH PROPOFOL N/A 08/05/2021   Procedure: ESOPHAGOGASTRODUODENOSCOPY (EGD) WITH PROPOFOL;  Surgeon: Rush Landmark Telford Nab., MD;  Location: [place];  Service: Gastroenterology;  Laterality: N/A;   EUS N/A 08/05/2021   Procedure: UPPER ENDOSCOPIC ULTRASOUND (EUS) RADIAL;  Surgeon: Irving Copas., MD;  Location: [place];  Service: Gastroenterology;  Laterality: N/A;   FINE NEEDLE ASPIRATION  08/05/2021   Procedure: FINE NEEDLE ASPIRATION (FNA) LINEAR;  Surgeon: Irving Copas., MD;  Location: [place];  Service: Gastroenterology;;   INGUINAL HERNIA REPAIR Right 1951   IR ANGIO INTRA EXTRACRAN SEL COM CAROTID INNOMINATE BILAT MOD SED  02/20/2019   IR ENDOLUMINAL BX OF BILIARY TREE  07/22/2021   IR GENERIC HISTORICAL  10/28/2016   IR RADIOLOGIST EVAL & MGMT 10/28/2016 [facility]INTER[facility] RAD   IR INT EXT BILIARY DRAIN WITH CHOLANGIOGRAM  07/22/2021   PORTACATH PLACEMENT N/A 08/19/2021   Procedure: INSERTION PORT-A-CATH;  Surgeon: Dwan Bolt, MD;  Location: [facility];  Service: General;  Laterality: N/A;  LMA   RADIOLOGY WITH ANESTHESIA N/A 10/21/2015   Procedure: RADIOLOGY WITH ANESTHESIA;  Surgeon: Luanne Bras, MD;  Location: [place];  Service: Radiology;  Laterality: N/A;   RADIOLOGY WITH ANESTHESIA N/A 02/20/2019   Procedure: [employer];  Surgeon: Luanne Bras, MD;  Location: [place];  Service: Radiology;  Laterality: N/A;   ROTATOR CUFF REPAIR Bilateral    VARICOSE VEIN SURGERY      Family History  Problem Relation Age of Onset   Breast cancer Mother    Lung cancer Father    Alcoholism Sister    Cirrhosis Sister    Colon cancer Neg Hx    Esophageal cancer Neg Hx    Liver cancer Neg Hx    Pancreatic cancer Neg Hx    Prostate cancer Neg Hx     Social History   Socioeconomic History   Marital status: Widowed     Spouse name: Not on file   Number of children: 5   Years of education: Not on file   Highest education level: Not on file  Occupational History   Occupation: retired  Tobacco Use   Smoking status: Former    Packs/day: 0.25    Years: 1.00    Pack years: 0.25    Types: Cigarettes    Quit date: 08/15/1961    Years since quitting: 60.1   Smokeless tobacco: Never  Vaping Use   Vaping Use: Never used  Substance and Sexual Activity   Alcohol use: No   Drug use: No   Sexual activity: Not Currently  Other Topics Concern   Not on file  Social History Narrative   Not on file   Social Determinants of Health   Financial Resource Strain: Not on file  Food Insecurity: Not on file  Transportation Needs: Not on file  Physical Activity: Not on file  Stress: Not on file  Social Connections: Not on file  Intimate Partner Violence: Not on file    Outpatient Medications Prior to Visit  Medication Sig Dispense Refill   apixaban (ELIQUIS) 5 MG TABS tablet Take 1 tablet (5 mg total) by mouth 2 (two) times daily. 60 tablet 0   aspirin EC 81 MG tablet Take 1 tablet (81 mg total) by mouth daily. Hold while on blood thinner/eliquis for 3 months, then resume (Patient taking differently: Take 81 mg by mouth daily.) 30 tablet 11   bisoprolol-hydrochlorothiazide (ZIAC) 2.5-6.25 MG tablet Take 1 tablet by mouth every morning.     Cholecalciferol (VITAMIN D3) 50 MCG (2000 UT) TABS Take 2,000 Units by mouth daily.     diltiazem (CARDIZEM) 30 MG tablet Take 1 tablet (30 mg total) by mouth every 8 (eight) hours. 90 tablet 0   escitalopram (LEXAPRO) 20 MG tablet Take 20 mg by mouth daily.     famotidine (PEPCID) 20 MG tablet Take 1 tablet (20 mg total) by mouth 2 (two) times daily. 60 tablet 1   feeding supplement (ENSURE ENLIVE / ENSURE PLUS) LIQD Take 237 mLs by mouth 3 (three) times daily between meals. 237 mL 12   mirtazapine (REMERON) 7.5 MG tablet Take 1 tablet (7.5 mg total) by mouth at bedtime. 90  tablet 0   Multiple Vitamin (MULTIVITAMIN WITH MINERALS) TABS tablet Take 1 tablet by mouth daily. 30 tablet 0   Nutritional Supplements (,FEEDING SUPPLEMENT, PROSOURCE PLUS) liquid Take 30 mLs by mouth 2 (two) times daily between meals. 887 mL ML   oxyCODONE (OXY IR/ROXICODONE) 5 MG immediate release tablet Take 1 tablet (5 mg total) by mouth every 6 (six) hours as needed for moderate pain. 10 tablet 0   pantoprazole (PROTONIX) 40 MG tablet Take 1 tablet (40 mg total) by mouth daily. 30 tablet 2   Sodium Chloride Flush (NORMAL SALINE FLUSH) 0.9 %  SOLN Flush tube once daily with 1 syringe. 300 mL 0   SYNTHROID 112 MCG tablet Take 1 tablet (112 mcg total) by mouth daily before breakfast. 90 tablet 1   vitamin B-12 (CYANOCOBALAMIN) 1000 MCG tablet Take 1,000 mcg by mouth 2 (two) times daily.     prochlorperazine (COMPAZINE) 10 MG tablet Take 1 tablet (10 mg total) by mouth every 6 (six) hours as needed (Nausea or vomiting). (Patient not taking: Reported on 08/28/2021) 30 tablet 1   sucralfate (CARAFATE) 1 GM/10ML suspension Take 10 mLs (1 g total) by mouth 2 (two) times daily. (Patient not taking: Reported on 08/28/2021) 420 mL 2   No facility-administered medications prior to visit.    No Known Allergies  ROS Review of Systems  Constitutional:  Positive for activity change, appetite change and fatigue. Negative for chills and fever.  HENT:  Negative for congestion, postnasal drip, rhinorrhea, sinus pressure, sinus pain, sneezing and sore throat.   Eyes: Negative.   Respiratory:  Positive for shortness of breath. Negative for cough and wheezing.   Cardiovascular:  Positive for palpitations and leg swelling. Negative for chest pain.       New diagnosis of atrial fibrillation  Gastrointestinal:  Negative for constipation, diarrhea, nausea and vomiting.  Endocrine: Negative for cold intolerance, heat intolerance, polydipsia and polyuria.  Genitourinary:  Negative for dysuria, frequency and  urgency.  Musculoskeletal:  Negative for back pain and myalgias.  Skin:  Negative for rash.  Allergic/Immunologic: Negative for environmental allergies.  Neurological:  Positive for weakness. Negative for dizziness and headaches.  Psychiatric/Behavioral:  Positive for dysphoric mood. The patient is nervous/anxious.      Objective:    Physical Exam Vitals and nursing note reviewed.  Constitutional:      Appearance: Normal appearance. He is well-developed. He is ill-appearing.  HENT:     Head: Normocephalic and atraumatic.     Nose: Nose normal.     Mouth/Throat:     Mouth: Mucous membranes are moist.  Eyes:     Extraocular Movements: Extraocular movements intact.     Conjunctiva/sclera: Conjunctivae normal.     Pupils: Pupils are equal, round, and reactive to light.  Neck:     Vascular: No carotid bruit.  Cardiovascular:     Rate and Rhythm: Normal rate and regular rhythm.     Pulses: Normal pulses.     Heart sounds: Normal heart sounds.  Pulmonary:     Effort: Pulmonary effort is normal.     Breath sounds: Normal breath sounds.  Abdominal:     Palpations: Abdomen is soft.  Musculoskeletal:        General: Normal range of motion.     Cervical back: Normal range of motion and neck supple.  Lymphadenopathy:     Cervical: No cervical adenopathy.  Skin:    General: Skin is warm and dry.     Capillary Refill: Capillary refill takes less than 2 seconds.  Neurological:     General: No focal deficit present.     Mental Status: He is alert and oriented to person, place, and time.  Psychiatric:        Attention and Perception: Attention normal.        Mood and Affect: Affect normal. Mood is anxious and depressed.        Speech: Speech normal.        Behavior: Behavior normal. Behavior is cooperative.        Thought Content: Thought content normal.  Cognition and Memory: Cognition and memory normal.        Judgment: Judgment normal.    Today's Vitals   09/07/21 1348   BP: 117/69  Pulse: (!) 55  Temp: (!) 97.5 F (36.4 C)  SpO2: 96%  Weight: 146 lb (66.2 kg)  Height: _0  (1.753 m)   Body mass index is 21.56 kg/m.   Wt Readings from Last 3 Encounters:  09/07/21 146 lb (66.2 kg)  09/06/21 148 lb 14.4 oz (67.5 kg)  08/28/21 154 lb 15.7 oz (70.3 kg)     Health Maintenance Due  Topic Date Due   COVID-19 Vaccine (3 - Pfizer risk series) 02/12/2020   INFLUENZA VACCINE  03/15/2021    There are no preventive care reminders to display for this patient.  Lab Results  Component Value Date   TSH 4.060 07/27/2021   Lab Results  Component Value Date   WBC 4.7 09/06/2021   HGB 10.3 (L) 09/06/2021   HCT 30.7 (L) 09/06/2021   MCV 81.4 09/06/2021   PLT 252 09/06/2021   Lab Results  Component Value Date   NA 137 09/06/2021   K 4.0 09/06/2021   CO2 27 09/06/2021   GLUCOSE 117 (H) 09/06/2021   BUN 17 09/06/2021   CREATININE 1.14 09/06/2021   BILITOT 1.4 (H) 09/06/2021   ALKPHOS 201 (H) 09/06/2021   AST 80 (H) 09/06/2021   ALT 70 (H) 09/06/2021   PROT 6.5 09/06/2021   ALBUMIN 3.4 (L) 09/06/2021   CALCIUM 8.9 09/06/2021   ANIONGAP 9 09/06/2021   EGFR 62 07/27/2021   GFR 56.67 (L) 06/29/2018   Lab Results  Component Value Date   CHOL (H) 07/10/2009    257        ATP III CLASSIFICATION:  <200     mg/dL   Desirable  200-239  mg/dL   Borderline High  >=240    mg/dL   High          Lab Results  Component Value Date   HDL 44 07/10/2009   Lab Results  Component Value Date   LDLCALC (H) 07/10/2009    160        Total Cholesterol/HDL:CHD Risk Coronary Heart Disease Risk Table                     Men   Women  1/2 Average Risk   3.4   3.3  Average Risk       5.0   4.4  2 X Average Risk   9.6   7.1  3 X Average Risk  23.4   11.0        Use the calculated Patient Ratio above and the CHD Risk Table to determine the patient's CHD Risk.        ATP III CLASSIFICATION (LDL):  <100     mg/dL   Optimal  100-129  mg/dL   Near or  Above                    Optimal  130-159  mg/dL   Borderline  160-189  mg/dL   High  >190     mg/dL   Very High   Lab Results  Component Value Date   TRIG 125 07/27/2020   Lab Results  Component Value Date   CHOLHDL 5.8 07/10/2009      Assessment & Plan:  1. Hospital discharge follow-up Patient recently hospitalized for sudden development of weakness following chemotherapy  treatment.  New development of atrial fibrillation.  Reviewed hospital progress notes, labs, and imaging studies done during that hospital stay.  2. Paroxysmal atrial fibrillation ([place]) Patient now taking Eliquis and diltiazem as prescribed.  New referral to cardiology made during today's visit. - Ambulatory referral to Cardiology  3. Lower extremity edema Start hydrochlorothiazide 12.5 mg tablets daily for 7 days then take as needed for further lower extremity swelling. - hydrochlorothiazide (HYDRODIURIL) 12.5 MG tablet; Take 1 tablet po QD for 7 days then as needed  Dispense: 30 tablet; Refill: 1  4. Malignant neoplasm of body of pancreas [facility]) Patient should continue regular visits with oncology as scheduled.   Problem List Items Addressed This Visit       Cardiovascular and Mediastinum   Paroxysmal atrial fibrillation (HCC)   Relevant Medications   hydrochlorothiazide (HYDRODIURIL) 12.5 MG tablet   Other Relevant Orders   Ambulatory referral to Cardiology     Digestive   Malignant neoplasm of body of pancreas ([place])     Other   Lower extremity edema   Relevant Medications   hydrochlorothiazide (HYDRODIURIL) 12.5 MG tablet   Other Visit Diagnoses     Hospital discharge follow-up    -  Primary       Meds ordered this encounter  Medications   hydrochlorothiazide (HYDRODIURIL) 12.5 MG tablet    Sig: Take 1 tablet po QD for 7 days then as needed    Dispense:  30 tablet    Refill:  1    Order Specific Question:   Supervising Provider    Answer:   Beatrice Lecher D [[id number]]     Follow-up: Return for as scheduled.    Ronnell Freshwater, NP  This note was dictated using Systems analyst. Rapid proofreading was performed to expedite the delivery of the information. Despite proofreading, phonetic errors will occur which are common with this voice recognition software. Please take this into consideration. If there are any concerns, please contact our office.

## 2021-09-09 ENCOUNTER — Encounter: Payer: Medicare HMO | Admitting: Dietician

## 2021-09-09 ENCOUNTER — Ambulatory Visit: Payer: Medicare HMO

## 2021-09-09 ENCOUNTER — Ambulatory Visit: Payer: Medicare HMO | Admitting: Hematology

## 2021-09-09 ENCOUNTER — Other Ambulatory Visit: Payer: Medicare HMO

## 2021-09-10 ENCOUNTER — Encounter: Payer: Self-pay | Admitting: Hematology

## 2021-09-10 NOTE — Progress Notes (Signed)
Called patient and left voicemail to return my call regarding possible assistance.  Will discuss [employer] grant and possible copay assistance if needed. Left my contact name and number on voicemail.

## 2021-09-12 ENCOUNTER — Encounter: Payer: Self-pay | Admitting: Hematology

## 2021-09-12 DIAGNOSIS — R600 Localized edema: Secondary | ICD-10-CM | POA: Insufficient documentation

## 2021-09-14 ENCOUNTER — Encounter: Payer: Self-pay | Admitting: Nurse Practitioner

## 2021-09-14 ENCOUNTER — Other Ambulatory Visit: Payer: Self-pay

## 2021-09-14 ENCOUNTER — Ambulatory Visit (INDEPENDENT_AMBULATORY_CARE_PROVIDER_SITE_OTHER): Payer: Medicare HMO | Admitting: Nurse Practitioner

## 2021-09-14 VITALS — BP 103/61 | HR 60 | Temp 97.9°F | Ht 69.0 in | Wt 140.3 lb

## 2021-09-14 DIAGNOSIS — L089 Local infection of the skin and subcutaneous tissue, unspecified: Secondary | ICD-10-CM | POA: Diagnosis not present

## 2021-09-14 MED ORDER — MUPIROCIN 2 % EX OINT: TOPICAL_OINTMENT | CUTANEOUS | 1 refills | Status: DC

## 2021-09-14 NOTE — Progress Notes (Signed)
Established patient visit   Patient: Angel Keller.   DOB: 07-28-1941   81 y.o. Male  MRN: [id number] Visit Date: 09/14/2021  Chief Complaint  Patient presents with   Skin Problem    Possible infection of insertion site of pancreatic drainage tube.    Subjective    HPI The patient states that this morning when changing dressing for drainage tube insertion site, patient's daughter noted some redness around the insertion site.  Small bit of bleeding is also present.  Patient denies tenderness or fever.  He has no body aches or muscle pain.  HPI     Skin Problem    Additional comments: Possible infection of insertion site of pancreatic drainage tube.       Last edited by Ronnell Freshwater, NP on 09/19/2021  5:45 PM.        Medications: Outpatient Medications Prior to Visit  Medication Sig   apixaban (ELIQUIS) 5 MG TABS tablet Take 1 tablet (5 mg total) by mouth 2 (two) times daily.   aspirin EC 81 MG tablet Take 1 tablet (81 mg total) by mouth daily. Hold while on blood thinner/eliquis for 3 months, then resume (Patient taking differently: Take 81 mg by mouth daily.)   bisoprolol-hydrochlorothiazide (ZIAC) 2.5-6.25 MG tablet Take 1 tablet by mouth every morning.   Cholecalciferol (VITAMIN D3) 50 MCG (2000 UT) TABS Take 2,000 Units by mouth daily.   diltiazem (CARDIZEM) 30 MG tablet Take 1 tablet (30 mg total) by mouth every 8 (eight) hours.   escitalopram (LEXAPRO) 20 MG tablet Take 20 mg by mouth daily.   famotidine (PEPCID) 20 MG tablet Take 1 tablet (20 mg total) by mouth 2 (two) times daily.   feeding supplement (ENSURE ENLIVE / ENSURE PLUS) LIQD Take 237 mLs by mouth 3 (three) times daily between meals.   hydrochlorothiazide (HYDRODIURIL) 12.5 MG tablet Take 1 tablet po QD for 7 days then as needed   mirtazapine (REMERON) 7.5 MG tablet Take 1 tablet (7.5 mg total) by mouth at bedtime.   Multiple Vitamin (MULTIVITAMIN WITH MINERALS) TABS tablet Take 1 tablet by mouth daily.    Nutritional Supplements (,FEEDING SUPPLEMENT, PROSOURCE PLUS) liquid Take 30 mLs by mouth 2 (two) times daily between meals.   oxyCODONE (OXY IR/ROXICODONE) 5 MG immediate release tablet Take 1 tablet (5 mg total) by mouth every 6 (six) hours as needed for moderate pain.   pantoprazole (PROTONIX) 40 MG tablet Take 1 tablet (40 mg total) by mouth daily.   SYNTHROID 112 MCG tablet Take 1 tablet (112 mcg total) by mouth daily before breakfast.   vitamin B-12 (CYANOCOBALAMIN) 1000 MCG tablet Take 1,000 mcg by mouth 2 (two) times daily.   prochlorperazine (COMPAZINE) 10 MG tablet Take 1 tablet (10 mg total) by mouth every 6 (six) hours as needed (Nausea or vomiting). (Patient not taking: Reported on 08/28/2021)   sucralfate (CARAFATE) 1 GM/10ML suspension Take 10 mLs (1 g total) by mouth 2 (two) times daily. (Patient not taking: Reported on 08/28/2021)   [DISCONTINUED] Sodium Chloride Flush (NORMAL SALINE FLUSH) 0.9 % SOLN Flush tube once daily with 1 syringe.   No facility-administered medications prior to visit.    Review of Systems  Constitutional:  Positive for activity change, appetite change and fatigue. Negative for chills and fever.  HENT:  Negative for congestion, postnasal drip, rhinorrhea, sinus pressure, sinus pain, sneezing and sore throat.   Eyes: Negative.   Respiratory:  Negative for cough, shortness of breath and wheezing.  Cardiovascular:  Negative for chest pain, palpitations and leg swelling.  Gastrointestinal:  Negative for constipation, diarrhea, nausea and vomiting.  Endocrine: Negative for cold intolerance, heat intolerance, polydipsia and polyuria.  Genitourinary:  Negative for dysuria, frequency and urgency.  Musculoskeletal:  Negative for back pain and myalgias.  Skin:  Negative for rash.       Mild redness present around the insertion site noted this morning.  Small amount of bloody drainage also present.  Allergic/Immunologic: Negative for environmental allergies.   Neurological:  Positive for weakness. Negative for dizziness and headaches.  Psychiatric/Behavioral:  Positive for dysphoric mood. The patient is nervous/anxious.     Objective     Today's Vitals   09/14/21 1453  BP: 103/61  Pulse: 60  Temp: 97.9 F (36.6 C)  SpO2: 97%  Weight: 140 lb 4.8 oz (63.6 kg)  Height: 5\' 9"  (1.753 m)   Body mass index is 20.72 kg/m.   Physical Exam Vitals and nursing note reviewed.  Constitutional:      Appearance: Normal appearance. He is well-developed.  HENT:     Head: Normocephalic and atraumatic.     Nose: Nose normal.     Mouth/Throat:     Mouth: Mucous membranes are moist.     Pharynx: Oropharynx is clear.  Eyes:     Extraocular Movements: Extraocular movements intact.     Conjunctiva/sclera: Conjunctivae normal.     Pupils: Pupils are equal, round, and reactive to light.  Cardiovascular:     Rate and Rhythm: Normal rate and regular rhythm.     Pulses: Normal pulses.     Heart sounds: Normal heart sounds.  Pulmonary:     Effort: Pulmonary effort is normal.     Breath sounds: Normal breath sounds.  Abdominal:     Palpations: Abdomen is soft.  Musculoskeletal:        General: Normal range of motion.     Cervical back: Normal range of motion and neck supple.  Lymphadenopathy:     Cervical: No cervical adenopathy.  Skin:    General: Skin is warm and dry.     Capillary Refill: Capillary refill takes less than 2 seconds.     Comments: Very slight redness present at insertion site of drainage tube.  No warmth, no swelling present at this time.  Very small amount of old blood present on bandage.  Neurological:     General: No focal deficit present.     Mental Status: He is alert and oriented to person, place, and time.  Psychiatric:        Mood and Affect: Mood normal.        Behavior: Behavior normal.        Thought Content: Thought content normal.        Judgment: Judgment normal.      Assessment & Plan     1. Superficial  skin infection Very mild redness present at the drainage tube insertion site.  We will add Bactroban ointment.  May apply to affected area twice daily.  Continue to keep surgical site clean and dry, applying a clean and dry dressing every 24 hours. - mupirocin ointment (BACTROBAN) 2 %; Apply to effected area twice daily for next 7 days  Dispense: 22 g; Refill: 1   Return for prn worsening or persistent symptoms.        Ronnell Freshwater, NP  [facility] [phone] (phone) [phone] (fax)  [place]

## 2021-09-16 ENCOUNTER — Other Ambulatory Visit: Payer: Self-pay | Admitting: Radiology

## 2021-09-16 ENCOUNTER — Other Ambulatory Visit (HOSPITAL_COMMUNITY): Payer: Self-pay | Admitting: Interventional Radiology

## 2021-09-16 ENCOUNTER — Encounter (HOSPITAL_COMMUNITY): Payer: Self-pay

## 2021-09-16 ENCOUNTER — Telehealth (HOSPITAL_COMMUNITY): Payer: Self-pay

## 2021-09-16 DIAGNOSIS — I729 Aneurysm of unspecified site: Secondary | ICD-10-CM

## 2021-09-16 NOTE — Telephone Encounter (Signed)
Pt's daughter will call back to schedule. AW

## 2021-09-17 ENCOUNTER — Inpatient Hospital Stay: Payer: Medicare HMO | Admitting: Dietician

## 2021-09-17 ENCOUNTER — Other Ambulatory Visit: Payer: Self-pay | Admitting: Nurse Practitioner

## 2021-09-17 ENCOUNTER — Other Ambulatory Visit: Payer: Self-pay

## 2021-09-17 ENCOUNTER — Ambulatory Visit: Payer: Medicare HMO | Admitting: Hematology

## 2021-09-17 ENCOUNTER — Inpatient Hospital Stay: Payer: Medicare HMO | Attending: Physician Assistant

## 2021-09-17 ENCOUNTER — Telehealth: Payer: Self-pay | Admitting: Nurse Practitioner

## 2021-09-17 ENCOUNTER — Other Ambulatory Visit (HOSPITAL_COMMUNITY): Payer: Self-pay

## 2021-09-17 ENCOUNTER — Inpatient Hospital Stay: Payer: Medicare HMO

## 2021-09-17 VITALS — BP 132/79 | HR 53 | Temp 98.0°F | Resp 17

## 2021-09-17 DIAGNOSIS — I952 Hypotension due to drugs: Secondary | ICD-10-CM | POA: Diagnosis not present

## 2021-09-17 DIAGNOSIS — R110 Nausea: Secondary | ICD-10-CM | POA: Insufficient documentation

## 2021-09-17 DIAGNOSIS — C251 Malignant neoplasm of body of pancreas: Secondary | ICD-10-CM

## 2021-09-17 DIAGNOSIS — Z5111 Encounter for antineoplastic chemotherapy: Secondary | ICD-10-CM | POA: Diagnosis not present

## 2021-09-17 DIAGNOSIS — T451X5A Adverse effect of antineoplastic and immunosuppressive drugs, initial encounter: Secondary | ICD-10-CM | POA: Insufficient documentation

## 2021-09-17 DIAGNOSIS — C250 Malignant neoplasm of head of pancreas: Secondary | ICD-10-CM | POA: Diagnosis not present

## 2021-09-17 DIAGNOSIS — Z79899 Other long term (current) drug therapy: Secondary | ICD-10-CM | POA: Diagnosis not present

## 2021-09-17 DIAGNOSIS — R5383 Other fatigue: Secondary | ICD-10-CM | POA: Insufficient documentation

## 2021-09-17 DIAGNOSIS — Z95828 Presence of other vascular implants and grafts: Secondary | ICD-10-CM

## 2021-09-17 LAB — CBC WITH DIFFERENTIAL (CANCER CENTER ONLY)
Abs Immature Granulocytes: 0.02 10*3/uL (ref 0.00–0.07)
Basophils Absolute: 0 10*3/uL (ref 0.0–0.1)
Basophils Relative: 1 %
Eosinophils Absolute: 0.2 10*3/uL (ref 0.0–0.5)
Eosinophils Relative: 5 %
HCT: 34.2 % — ABNORMAL LOW (ref 39.0–52.0)
Hemoglobin: 11.3 g/dL — ABNORMAL LOW (ref 13.0–17.0)
Immature Granulocytes: 0 %
Lymphocytes Relative: 37 %
Lymphs Abs: 1.7 10*3/uL (ref 0.7–4.0)
MCH: 27.6 pg (ref 26.0–34.0)
MCHC: 33 g/dL (ref 30.0–36.0)
MCV: 83.6 fL (ref 80.0–100.0)
Monocytes Absolute: 0.9 10*3/uL (ref 0.1–1.0)
Monocytes Relative: 20 %
Neutro Abs: 1.7 10*3/uL (ref 1.7–7.7)
Neutrophils Relative %: 37 %
Platelet Count: 413 10*3/uL — ABNORMAL HIGH (ref 150–400)
RBC: 4.09 MIL/uL — ABNORMAL LOW (ref 4.22–5.81)
RDW: 17 % — ABNORMAL HIGH (ref 11.5–15.5)
WBC Count: 4.6 10*3/uL (ref 4.0–10.5)
nRBC: 0 % (ref 0.0–0.2)

## 2021-09-17 LAB — CMP (CANCER CENTER ONLY)
ALT: 33 U/L (ref 0–44)
AST: 44 U/L — ABNORMAL HIGH (ref 15–41)
Albumin: 3.6 g/dL (ref 3.5–5.0)
Alkaline Phosphatase: 282 U/L — ABNORMAL HIGH (ref 38–126)
Anion gap: 8 (ref 5–15)
BUN: 16 mg/dL (ref 8–23)
CO2: 30 mmol/L (ref 22–32)
Calcium: 9.3 mg/dL (ref 8.9–10.3)
Chloride: 98 mmol/L (ref 98–111)
Creatinine: 1.23 mg/dL (ref 0.61–1.24)
GFR, Estimated: 59 mL/min — ABNORMAL LOW (ref >60)
Glucose, Bld: 153 mg/dL — ABNORMAL HIGH (ref 70–99)
Potassium: 3.7 mmol/L (ref 3.5–5.1)
Sodium: 136 mmol/L (ref 135–145)
Total Bilirubin: 1.1 mg/dL (ref 0.3–1.2)
Total Protein: 6.9 g/dL (ref 6.5–8.1)

## 2021-09-17 MED ORDER — PROCHLORPERAZINE MALEATE 10 MG PO TABS
10.0000 mg | ORAL_TABLET | Freq: Once | ORAL | Status: AC
  Administered 2021-09-17: 10 mg via ORAL
  Filled 2021-09-17: qty 1

## 2021-09-17 MED ORDER — NORMAL SALINE FLUSH 0.9 % IV SOLN
10.0000 mL | Freq: Every day | INTRAVENOUS | 1 refills | Status: DC
  Filled 2021-09-17: qty 300, 30d supply, fill #0

## 2021-09-17 MED ORDER — SODIUM CHLORIDE 0.9 % IV SOLN: Freq: Once | INTRAVENOUS | Status: AC

## 2021-09-17 MED ORDER — SODIUM CHLORIDE 0.9 % IV SOLN
800.0000 mg/m2 | Freq: Once | INTRAVENOUS | Status: AC
  Administered 2021-09-17: 1482 mg via INTRAVENOUS
  Filled 2021-09-17: qty 38.98

## 2021-09-17 MED ORDER — SODIUM CHLORIDE 0.9% FLUSH
10.0000 mL | Freq: Once | INTRAVENOUS | Status: AC
  Administered 2021-09-17: 10 mL

## 2021-09-17 MED ORDER — PACLITAXEL PROTEIN-BOUND CHEMO INJECTION 100 MG
80.0000 mg/m2 | Freq: Once | INTRAVENOUS | Status: AC
  Administered 2021-09-17: 150 mg via INTRAVENOUS
  Filled 2021-09-17: qty 30

## 2021-09-17 MED ORDER — HEPARIN SOD (PORK) LOCK FLUSH 100 UNIT/ML IV SOLN
500.0000 [IU] | Freq: Once | INTRAVENOUS | Status: AC | PRN
  Administered 2021-09-17: 500 [IU]

## 2021-09-17 MED ORDER — SODIUM CHLORIDE 0.9% FLUSH
10.0000 mL | INTRAVENOUS | Status: DC | PRN
  Administered 2021-09-17: 10 mL

## 2021-09-17 NOTE — Telephone Encounter (Signed)
Error

## 2021-09-17 NOTE — Patient Instructions (Signed)
[facility] ONCOLOGY  Discharge Instructions: Thank you for choosing [facility] to provide your oncology and hematology care.   If you have a lab appointment with the [facility], please go directly to the [facility] and check in at the registration area.   Wear comfortable clothing and clothing appropriate for easy access to any Portacath or PICC line.   We strive to give you quality time with your provider. You may need to reschedule your appointment if you arrive late (15 or more minutes).  Arriving late affects you and other patients whose appointments are after yours.  Also, if you miss three or more appointments without notifying the office, you may be dismissed from the clinic at the providers discretion.      For prescription refill requests, have your pharmacy contact our office and allow 72 hours for refills to be completed.    Today you received the following chemotherapy and/or immunotherapy agents: Abraxane and gemzar      To help prevent nausea and vomiting after your treatment, we encourage you to take your nausea medication as directed.  BELOW ARE SYMPTOMS THAT SHOULD BE REPORTED IMMEDIATELY: *FEVER GREATER THAN 100.4 F (38 C) OR HIGHER *CHILLS OR SWEATING *NAUSEA AND VOMITING THAT IS NOT CONTROLLED WITH YOUR NAUSEA MEDICATION *UNUSUAL SHORTNESS OF BREATH *UNUSUAL BRUISING OR BLEEDING *URINARY PROBLEMS (pain or burning when urinating, or frequent urination) *BOWEL PROBLEMS (unusual diarrhea, constipation, pain near the anus) TENDERNESS IN MOUTH AND THROAT WITH OR WITHOUT PRESENCE OF ULCERS (sore throat, sores in mouth, or a toothache) UNUSUAL RASH, SWELLING OR PAIN  UNUSUAL VAGINAL DISCHARGE OR ITCHING   Items with * indicate a potential emergency and should be followed up as soon as possible or go to the Emergency Department if any problems should occur.  Please show the CHEMOTHERAPY ALERT CARD or IMMUNOTHERAPY ALERT CARD at  check-in to the Emergency Department and triage nurse.  Should you have questions after your visit or need to cancel or reschedule your appointment, please contact [facility]  Dept: [phone]  and follow the prompts.  Office hours are 8:00 a.m. to 4:30 p.m. Monday - Friday. Please note that voicemails left after 4:00 p.m. may not be returned until the following business day.  We are closed weekends and major holidays. You have access to a nurse at all times for urgent questions. Please call the main number to the clinic Dept: [phone] and follow the prompts.   For any non-urgent questions, you may also contact your provider using MyChart. We now offer e-Visits for anyone 58 and older to request care online for non-urgent symptoms. For details visit [email or web address].   Also download the MyChart app! Go to the app store, search "MyChart", open the app, select [facility], and log in with your MyChart username and password.  Due to Covid, a mask is required upon entering the hospital/clinic. If you do not have a mask, one will be given to you upon arrival. For doctor visits, patients may have 1 support person aged 52 or older with them. For treatment visits, patients cannot have anyone with them due to current Covid guidelines and our immunocompromised population.

## 2021-09-17 NOTE — Progress Notes (Signed)
Prescriptions for saline flushes for biliary drainage tubing to be faxed to both [facility] for authorization and [facility] for home delivery of the flushes.

## 2021-09-17 NOTE — Progress Notes (Signed)
Nutrition Assessment   Reason for Assessment: Hospital f/u    ASSESSMENT: 81 year old male with pancreatic cancer. He is receiving neoadjuvant Abraxane/Gemcitabine. Patient is followed by Dr. Burr Medico.   Patient admitted to hospital 1/14-1/17 with SIRS, fever -patient diagnosed with severe PCM during admission  Met with patient in infusion. He reports feeling stronger and more like himself the past couple of days. Patient reports his appetite is better. This was decreased secondary to nausea, but this has resolved. Patient is receiving frozen meals from his insurance company. He likes these. Patient is currently living with his daughter, she has been cooking some meals for him. Yesterday patient had bowl of oatmeal, graham crackers, spaghetti and an Ensure. He likes Ensure, but these are filling.    Nutrition Focused Physical Exam: Noted moderate/severe fat and muscle depletions per inpatient RD exam on 08/31/21   Medications: Remeron, MVI, Compazine, Carafate, Oxycodone, B12, D3, Protonix   Labs: glucose 153, AST 44, Alkaline Phos 282   Anthropometrics: Last weight 140 lb 4.8 oz on 1/31 decreased 6% from 149 lb 6.4 oz on 1/12; significant for time frame  Height: 5'9" Weight: 63.6 kg  UBW: 154 lb 5.2 oz (08/05/21) BMI: 20.72   MALNUTRITION DIAGNOSIS: Severe malnutrition continues   INTERVENTION:  Discussed ways to add calories and protein to foods (using milk vs water to make oatmeal, using peanut butter on graham crackers, adding Ensure to coffee) Encouraged high calorie high protein snacks - handout with ideas provided Recommend 2-3 Ensure Plus/equivalent daily - coupons given and strategies for increasing intake discussed  Contact information provided    MONITORING, EVALUATION, GOAL: patient will tolerate increased calories and protein to promote weight gain   Next Visit: To be scheduled

## 2021-09-19 DIAGNOSIS — L089 Local infection of the skin and subcutaneous tissue, unspecified: Secondary | ICD-10-CM | POA: Insufficient documentation

## 2021-09-20 ENCOUNTER — Telehealth: Payer: Self-pay | Admitting: Hematology

## 2021-09-20 ENCOUNTER — Ambulatory Visit (HOSPITAL_COMMUNITY)
Admission: RE | Admit: 2021-09-20 | Discharge: 2021-09-20 | Disposition: A | Discharge status: Home / Self Care | Payer: Medicare HMO | Source: Ambulatory Visit | Attending: Surgery | Admitting: Surgery

## 2021-09-20 ENCOUNTER — Other Ambulatory Visit: Payer: Self-pay

## 2021-09-20 ENCOUNTER — Encounter (HOSPITAL_COMMUNITY): Payer: Self-pay

## 2021-09-20 DIAGNOSIS — Z87891 Personal history of nicotine dependence: Secondary | ICD-10-CM | POA: Insufficient documentation

## 2021-09-20 DIAGNOSIS — C259 Malignant neoplasm of pancreas, unspecified: Secondary | ICD-10-CM | POA: Insufficient documentation

## 2021-09-20 DIAGNOSIS — C250 Malignant neoplasm of head of pancreas: Secondary | ICD-10-CM

## 2021-09-20 HISTORY — PX: IR BILIARY STENT(S) EXISTING ACCESS INC DILATION CATH EXCHANGE: IMG6048

## 2021-09-20 LAB — CBC
HCT: 38.4 % — ABNORMAL LOW (ref 39.0–52.0)
Hemoglobin: 13 g/dL (ref 13.0–17.0)
MCH: 28.1 pg (ref 26.0–34.0)
MCHC: 33.9 g/dL (ref 30.0–36.0)
MCV: 83.1 fL (ref 80.0–100.0)
Platelets: 254 10*3/uL (ref 150–400)
RBC: 4.62 MIL/uL (ref 4.22–5.81)
RDW: 16.5 % — ABNORMAL HIGH (ref 11.5–15.5)
WBC: 5.7 10*3/uL (ref 4.0–10.5)
nRBC: 0 % (ref 0.0–0.2)

## 2021-09-20 LAB — COMPREHENSIVE METABOLIC PANEL
ALT: 74 U/L — ABNORMAL HIGH (ref 0–44)
AST: 143 U/L — ABNORMAL HIGH (ref 15–41)
Albumin: 3.2 g/dL — ABNORMAL LOW (ref 3.5–5.0)
Alkaline Phosphatase: 233 U/L — ABNORMAL HIGH (ref 38–126)
Anion gap: 11 (ref 5–15)
BUN: 22 mg/dL (ref 8–23)
CO2: 29 mmol/L (ref 22–32)
Calcium: 8.8 mg/dL — ABNORMAL LOW (ref 8.9–10.3)
Chloride: 91 mmol/L — ABNORMAL LOW (ref 98–111)
Creatinine, Ser: 1.35 mg/dL — ABNORMAL HIGH (ref 0.61–1.24)
GFR, Estimated: 53 mL/min — ABNORMAL LOW (ref >60)
Glucose, Bld: 131 mg/dL — ABNORMAL HIGH (ref 70–99)
Potassium: 3.5 mmol/L (ref 3.5–5.1)
Sodium: 131 mmol/L — ABNORMAL LOW (ref 135–145)
Total Bilirubin: 2.1 mg/dL — ABNORMAL HIGH (ref 0.3–1.2)
Total Protein: 7 g/dL (ref 6.5–8.1)

## 2021-09-20 LAB — PROTIME-INR
INR: 1.3 — ABNORMAL HIGH (ref 0.8–1.2)
Prothrombin Time: 16.3 seconds — ABNORMAL HIGH (ref 11.4–15.2)

## 2021-09-20 MED ORDER — SODIUM CHLORIDE 0.9 % IV SOLN: INTRAVENOUS | Status: DC

## 2021-09-20 MED ORDER — IOHEXOL 300 MG/ML  SOLN
100.0000 mL | Freq: Once | INTRAMUSCULAR | Status: AC | PRN
  Administered 2021-09-20: 20 mL

## 2021-09-20 MED ORDER — MIDAZOLAM HCL 2 MG/2ML IJ SOLN
INTRAMUSCULAR | Status: AC | PRN
  Administered 2021-09-20 (×2): .5 mg via INTRAVENOUS

## 2021-09-20 MED ORDER — SODIUM CHLORIDE 0.9 % IV SOLN: 2.0000 g | INTRAVENOUS | Status: AC

## 2021-09-20 MED ORDER — MIDAZOLAM HCL 2 MG/2ML IJ SOLN
INTRAMUSCULAR | Status: AC
  Filled 2021-09-20: qty 4

## 2021-09-20 MED ORDER — SODIUM CHLORIDE 0.9 % IV SOLN: Freq: Once | INTRAVENOUS | Status: DC

## 2021-09-20 MED ORDER — LIDOCAINE HCL 1 % IJ SOLN
INTRAMUSCULAR | Status: AC
  Administered 2021-09-20: 10 mL
  Filled 2021-09-20: qty 20

## 2021-09-20 MED ORDER — FENTANYL CITRATE (PF) 100 MCG/2ML IJ SOLN
INTRAMUSCULAR | Status: AC
  Filled 2021-09-20: qty 4

## 2021-09-20 MED ORDER — FENTANYL CITRATE (PF) 100 MCG/2ML IJ SOLN
INTRAMUSCULAR | Status: AC | PRN
Start: 2021-09-20 — End: 2021-09-20
  Administered 2021-09-20 (×2): 25 ug via INTRAVENOUS

## 2021-09-20 MED ORDER — SODIUM CHLORIDE 0.9 % IV SOLN
INTRAVENOUS | Status: AC
  Administered 2021-09-20: 2 g via INTRAVENOUS
  Filled 2021-09-20: qty 20

## 2021-09-20 NOTE — Telephone Encounter (Signed)
Attempted to schedule per 2/4 inbasket, pt daughter unable to schedule due to uncertainty,notified Desk Nurse & MD

## 2021-09-20 NOTE — Progress Notes (Signed)
Patients B/P improved after bolus. Please refer to flowsheets.

## 2021-09-20 NOTE — H&P (Signed)
Chief Complaint: Patient was seen in consultation today for Biliary drain injection with possible biliary stent placement at the request of Dr Burr Medico  Referring Physician(s): Allen,Shelby L  Supervising Physician: Jacqulynn Cadet  Patient Status: [facility] - Out-pt  History of Present Illness: Angel Mowers. is a 81 y.o. male   Dx Pancreatic cancer per bx 08/05/21 Failed ERCP Brushings suspicious for adenocarcinoma Known to IR 07/22/21 procedure: IMPRESSION: 1. 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 2. Successful brush biopsy of obstructing lesion x3. 3. Successful placement of a 10 French internal/external biliary drainage catheter.  EUS 08/05/21: EUS Impression: - A mass was identified in the pancreatic head. Cytology results are pending. However, the endosonographic appearance is suspicious for adenocarcinoma. This was staged T2 N0 Mx by endosonographic criteria. The staging applies if malignancy is confirmed. Fine needle biopsy performed. See above for arterial and venous discussion - There was dilation in the common bile duct which measured up to 15 mm. Hyperechoic material consistent with sludge was visualized endosonographically in the common bile duct. Plastic biliary drain was visualized endosonographically in the common bile duct and in the common hepatic duct. - A small cyst was found in the visualized portion of the liver and measured 5 mm by 5 mm but no other masses/lesions noted. - No malignant-appearing lymph nodes were visualized in the celiac region (level 20), peripancreatic region and porta hepatis region FINAL MICROSCOPIC DIAGNOSIS:  - Malignant cells consistent with adenocarcinoma  Dr Rush Landmark note: I called and spoke with the patient's daughter on 12/20 3 PM. I told her about the results of the biopsies returning showing evidence of adenocarcinoma of the pancreatic mass.  This is what  we expected. She said that her dad was not doing as well today but has been able to tolerate oral intake somewhat and is staying hydrated.  I told her to monitor him very closely and if he is not eating or drinking further or any sign of dehydration, that they need to bring him in to be further evaluated for the potential post EUS pancreatitis or any other complication. I will forward this information to Dr. Zenia Resides and Dr. Burr Medico and patient's PCP. If the patient is a nonsurgical candidate upfront, then either radiology will need to exchange his PBD to internal stenting or we can use the current PBD and exchange him via ERCP to a internal stent.  Will allow the oncology and surgical oncology service to help let us know their plans.  Now scheduled for I/E biliary drain injection in IR Possible Biliary stent placement with Dr Laurence Ferrari LD Eliquis 3 days ago Drain capped for weeks per Dtr  Past Medical History:  Diagnosis Date   Aneurysm ([place])    2017   Anxiety    Arthritis    Depression    Family history of breast cancer    Family history of lung cancer    Headache    HLD (hyperlipidemia)    Hx of inguinal hernia repair    Hypertension    Hypothyroidism    Overactive bladder    Pancreatic cancer ([place])    Pneumonia    Shingles    Sleep apnea    Patient denies   Thoracic ascending aortic aneurysm    mildly dilated 4.0cm per 08/12/21 CT   Thyroid disease    Varicose veins of both lower extremities     Past Surgical History:  Procedure Laterality Date   BILATERAL CARPAL  TUNNEL RELEASE     CATARACT EXTRACTION, BILATERAL Bilateral    ELBOW SURGERY Bilateral    ENDOSCOPIC RETROGRADE CHOLANGIOPANCREATOGRAPHY (ERCP) WITH PROPOFOL N/A 07/20/2021   Procedure: ENDOSCOPIC RETROGRADE CHOLANGIOPANCREATOGRAPHY (ERCP) WITH PROPOFOL;  Surgeon: Gatha Mayer, MD;  Location: [facility];  Service: Endoscopy;  Laterality: N/A;   ESOPHAGOGASTRODUODENOSCOPY (EGD) WITH PROPOFOL N/A 08/05/2021    Procedure: ESOPHAGOGASTRODUODENOSCOPY (EGD) WITH PROPOFOL;  Surgeon: Rush Landmark Telford Nab., MD;  Location: [facility];  Service: Gastroenterology;  Laterality: N/A;   EUS N/A 08/05/2021   Procedure: UPPER ENDOSCOPIC ULTRASOUND (EUS) RADIAL;  Surgeon: Irving Copas., MD;  Location: [facility];  Service: Gastroenterology;  Laterality: N/A;   FINE NEEDLE ASPIRATION  08/05/2021   Procedure: FINE NEEDLE ASPIRATION (FNA) LINEAR;  Surgeon: Irving Copas., MD;  Location: [place];  Service: Gastroenterology;;   INGUINAL HERNIA REPAIR Right 1951   IR ANGIO INTRA EXTRACRAN SEL COM CAROTID INNOMINATE BILAT MOD SED  02/20/2019   IR ENDOLUMINAL BX OF BILIARY TREE  07/22/2021   IR GENERIC HISTORICAL  10/28/2016   IR RADIOLOGIST EVAL & MGMT 10/28/2016 [facility] RAD   IR INT EXT BILIARY DRAIN WITH CHOLANGIOGRAM  07/22/2021   PORTACATH PLACEMENT N/A 08/19/2021   Procedure: INSERTION PORT-A-CATH;  Surgeon: Dwan Bolt, MD;  Location: [facility];  Service: General;  Laterality: N/A;  LMA   RADIOLOGY WITH ANESTHESIA N/A 10/21/2015   Procedure: RADIOLOGY WITH ANESTHESIA;  Surgeon: Luanne Bras, MD;  Location: [facility];  Service: Radiology;  Laterality: N/A;   RADIOLOGY WITH ANESTHESIA N/A 02/20/2019   Procedure: [employer];  Surgeon: Luanne Bras, MD;  Location: [facility];  Service: Radiology;  Laterality: N/A;   ROTATOR CUFF REPAIR Bilateral    VARICOSE VEIN SURGERY      Allergies: Patient has no known allergies.  Medications: Prior to Admission medications   Medication Sig Start Date End Date Taking? Authorizing Provider  bisoprolol-hydrochlorothiazide (ZIAC) 2.5-6.25 MG tablet Take 1 tablet by mouth every morning. 06/20/15  Yes [provider]  Cholecalciferol (VITAMIN D3) 50 MCG (2000 UT) TABS Take 2,000 Units by mouth daily.   Yes [provider]  diltiazem (CARDIZEM) 30 MG tablet Take 1 tablet (30 mg total) by mouth every 8 (eight) hours. 08/31/21  Yes Swayze, Ava,  DO  escitalopram (LEXAPRO) 20 MG tablet Take 20 mg by mouth daily. 12/22/18  Yes [provider]  famotidine (PEPCID) 20 MG tablet Take 1 tablet (20 mg total) by mouth 2 (two) times daily. 08/06/21  Yes Mansouraty, Telford Nab., MD  feeding supplement (ENSURE ENLIVE / ENSURE PLUS) LIQD Take 237 mLs by mouth 3 (three) times daily between meals. 08/31/21  Yes Swayze, Ava, DO  hydrochlorothiazide (HYDRODIURIL) 12.5 MG tablet Take 1 tablet po QD for 7 days then as needed 09/07/21  Yes Boscia, Heather E, NP  mirtazapine (REMERON) 7.5 MG tablet Take 1 tablet (7.5 mg total) by mouth at bedtime. 08/26/21  Yes Ronnell Freshwater, NP  Multiple Vitamin (MULTIVITAMIN WITH MINERALS) TABS tablet Take 1 tablet by mouth daily. 09/01/21  Yes Swayze, Ava, DO  mupirocin ointment (BACTROBAN) 2 % Apply to effected area twice daily for next 7 days 09/14/21  Yes Boscia, Heather E, NP  pantoprazole (PROTONIX) 40 MG tablet Take 1 tablet (40 mg total) by mouth daily. 07/12/21  Yes Boscia, Greer Ee, NP  prochlorperazine (COMPAZINE) 10 MG tablet Take 1 tablet (10 mg total) by mouth every 6 (six) hours as needed (Nausea or vomiting). 08/14/21  Yes Truitt Merle, MD  Sodium Chloride Flush (NORMAL SALINE FLUSH) 0.9 % SOLN Give 10 mLs by tube daily. 09/17/21 11/16/21 Yes Boscia, Heather E, NP  sucralfate (CARAFATE) 1 GM/10ML suspension Take 10 mLs (1 g total) by mouth 2 (two) times daily. 08/05/21 08/05/22 Yes Mansouraty, Telford Nab., MD  SYNTHROID 112 MCG tablet Take 1 tablet (112 mcg total) by mouth daily before breakfast. 08/26/21  Yes Boscia, Greer Ee, NP  vitamin B-12 (CYANOCOBALAMIN) 1000 MCG tablet Take 1,000 mcg by mouth 2 (two) times daily.   Yes [provider]  apixaban (ELIQUIS) 5 MG TABS tablet Take 1 tablet (5 mg total) by mouth 2 (two) times daily. 08/31/21   Swayze, Ava, DO  aspirin EC 81 MG tablet Take 1 tablet (81 mg total) by mouth daily. Hold while on blood thinner/eliquis for 3 months, then resume Patient  taking differently: Take 81 mg by mouth daily. 07/30/20   Rai, Vernelle Emerald, MD  Nutritional Supplements (,FEEDING SUPPLEMENT, PROSOURCE PLUS) liquid Take 30 mLs by mouth 2 (two) times daily between meals. 09/01/21   Swayze, Ava, DO  oxyCODONE (OXY IR/ROXICODONE) 5 MG immediate release tablet Take 1 tablet (5 mg total) by mouth every 6 (six) hours as needed for moderate pain. 07/23/21   Ghimire, Henreitta Leber, MD     Family History  Problem Relation Age of Onset   Breast cancer Mother    Lung cancer Father    Alcoholism Sister    Cirrhosis Sister    Colon cancer Neg Hx    Esophageal cancer Neg Hx    Liver cancer Neg Hx    Pancreatic cancer Neg Hx    Prostate cancer Neg Hx     Social History   Socioeconomic History   Marital status: Widowed    Spouse name: Not on file   Number of children: 5   Years of education: Not on file   Highest education level: Not on file  Occupational History   Occupation: retired  Tobacco Use   Smoking status: Former    Packs/day: 0.25    Years: 1.00    Pack years: 0.25    Types: Cigarettes    Quit date: 08/15/1961    Years since quitting: 60.1   Smokeless tobacco: Never  Vaping Use   Vaping Use: Never used  Substance and Sexual Activity   Alcohol use: No   Drug use: No   Sexual activity: Not Currently  Other Topics Concern   Not on file  Social History Narrative   Not on file   Social Determinants of Health   Financial Resource Strain: Not on file  Food Insecurity: Not on file  Transportation Needs: Not on file  Physical Activity: Not on file  Stress: Not on file  Social Connections: Not on file    Review of Systems: A 12 point ROS discussed and pertinent positives are indicated in the HPI above.  All other systems are negative.  Review of Systems  Constitutional:  Positive for activity change, appetite change, fatigue and unexpected weight change.  Respiratory:  Negative for cough and shortness of breath.   Cardiovascular:  Negative for  chest pain.  Gastrointestinal:  Negative for abdominal pain.  Neurological:  Positive for weakness.  Psychiatric/Behavioral:  Negative for behavioral problems and confusion.    Vital Signs: BP 102/63 (BP Location: Right Arm)    Pulse 78    Temp 98 F (36.7 C) (Oral)    Resp 17    Ht 5\' 9"  (1.753 m)  Wt 140 lb (63.5 kg)    SpO2 95%    BMI 20.67 kg/m   Physical Exam Vitals reviewed.  Constitutional:      Comments: Very thin and frail  HENT:     Mouth/Throat:     Mouth: Mucous membranes are moist.  Cardiovascular:     Rate and Rhythm: Normal rate and regular rhythm.     Heart sounds: Normal heart sounds.  Pulmonary:     Effort: Pulmonary effort is normal.     Breath sounds: Normal breath sounds.  Abdominal:     General: Bowel sounds are normal.     Tenderness: There is no abdominal tenderness.     Comments: Biliary drain intact  Musculoskeletal:        General: Normal range of motion.  Skin:    General: Skin is warm.  Neurological:     Mental Status: He is alert and oriented to person, place, and time.  Psychiatric:        Behavior: Behavior normal.    Imaging: CT Angio Chest Pulmonary Embolism (PE) W or WO Contrast  Result Date: 08/28/2021 CLINICAL DATA:  Shortness of breath with pulmonary embolism suspected EXAM: CT ANGIOGRAPHY CHEST WITH CONTRAST TECHNIQUE: Multidetector CT imaging of the chest was performed using the standard protocol during bolus administration of intravenous contrast. Multiplanar CT image reconstructions and MIPs were obtained to evaluate the vascular anatomy. RADIATION DOSE REDUCTION: This exam was performed according to the departmental dose-optimization program which includes automated exposure control, adjustment of the mA and/or kV according to patient size and/or use of iterative reconstruction technique. CONTRAST:  49mL OMNIPAQUE IOHEXOL 350 MG/ML SOLN COMPARISON:  08/12/2021 chest CT. FINDINGS: Cardiovascular: Satisfactory opacification of the  pulmonary arteries to the segmental level, although some mixing artifact seen and lower lobe vessels. No evidence of pulmonary embolism. Normal heart size. No pericardial effusion. Atheromatous calcification of the aorta and coronaries. Mediastinum/Nodes: Negative for adenopathy or visible inflammation Lungs/Pleura: Motion artifact at the lung bases. Dependent atelectasis. There is no edema, consolidation, effusion, or pneumothorax. Upper Abdomen: Trans hepatic biliary drain which is minimally covered. Recent staging CT. Musculoskeletal: Bridging thoracic osteophytes Review of the MIP images confirms the above findings. IMPRESSION: Negative for pulmonary embolism or other acute finding. Electronically Signed   By: Jorje Guild M.D.   On: 08/28/2021 04:27   DG Chest Port 1 View  Result Date: 08/27/2021 CLINICAL DATA:  Questionable sepsis - evaluate for abnormality EXAM: PORTABLE CHEST 1 VIEW.  Patient is rotated COMPARISON:  Chest x-ray 08/19/2021, CT chest 08/12/2021 FINDINGS: Right chest wall Port-A-Cath with tip overlying the distal superior vena cava. The heart and mediastinal contours are unchanged. Atherosclerotic plaque No focal consolidation. No pulmonary edema. No pleural effusion. No pneumothorax. No acute osseous abnormality. IMPRESSION: 1. No active disease. 2.  Aortic Atherosclerosis (ICD10-I70.0). Electronically Signed   By: Iven Finn M.D.   On: 08/27/2021 23:07   ECHOCARDIOGRAM COMPLETE  Result Date: 08/28/2021    ECHOCARDIOGRAM REPORT   Patient Name:   Angel Keller. Date of Exam: 08/28/2021 Medical Rec #:  [id number]         Height:       69.0 in Accession #:    [id number]        Weight:       149.0 lb Date of Birth:  February 24, 1941         BSA:          1.823 m Patient Age:  80 years          BP:           117/61 mmHg Patient Gender: M                 HR:           63 bpm. Exam Location:  Inpatient Procedure: 2D Echo Indications:    Atrial fibrillation  History:        Patient has no  prior history of Echocardiogram examinations.                 Risk Factors:Dyslipidemia and Hypertension. Dilated thoracic                 ascending aorta.  Sonographer:    Arlyss Gandy Referring Phys: [id number] [place]  1. Left ventricular ejection fraction, by estimation, is 60 to 65%. The left ventricle has normal function. The left ventricle has no regional wall motion abnormalities. Left ventricular diastolic parameters are consistent with Grade I diastolic dysfunction (impaired relaxation).  2. Right ventricular systolic function is normal. The right ventricular size is normal. There is normal pulmonary artery systolic pressure.  3. Left atrial size was mildly dilated.  4. Right atrial size was mildly dilated.  5. The mitral valve is grossly normal. Trivial mitral valve regurgitation.  6. The aortic valve is tricuspid. There is mild calcification of the aortic valve. There is mild thickening of the aortic valve. Aortic valve regurgitation is not visualized. Aortic valve sclerosis/calcification is present, without any evidence of aortic stenosis.  7. The inferior vena cava is normal in size with greater than 50% respiratory variability, suggesting right atrial pressure of 3 mmHg. Comparison(s): No prior Echocardiogram. FINDINGS  Left Ventricle: Left ventricular ejection fraction, by estimation, is 60 to 65%. The left ventricle has normal function. The left ventricle has no regional wall motion abnormalities. The left ventricular internal cavity size was normal in size. There is  no left ventricular hypertrophy. Left ventricular diastolic parameters are consistent with Grade I diastolic dysfunction (impaired relaxation). Right Ventricle: The right ventricular size is normal. No increase in right ventricular wall thickness. Right ventricular systolic function is normal. There is normal pulmonary artery systolic pressure. The tricuspid regurgitant velocity is 2.78 m/s, and  with an assumed  right atrial pressure of 3 mmHg, the estimated right ventricular systolic pressure is 71.2 mmHg. Left Atrium: Left atrial size was mildly dilated. Right Atrium: Right atrial size was mildly dilated. Pericardium: There is no evidence of pericardial effusion. Mitral Valve: The mitral valve is grossly normal. There is mild thickening of the mitral valve leaflet(s). There is mild calcification of the mitral valve leaflet(s). Mild mitral annular calcification. Trivial mitral valve regurgitation. Tricuspid Valve: The tricuspid valve is normal in structure. Tricuspid valve regurgitation is trivial. Aortic Valve: The aortic valve is tricuspid. There is mild calcification of the aortic valve. There is mild thickening of the aortic valve. Aortic valve regurgitation is not visualized. Aortic valve sclerosis/calcification is present, without any evidence of aortic stenosis. Aortic valve mean gradient measures 4.0 mmHg. Aortic valve peak gradient measures 8.4 mmHg. Aortic valve area, by VTI measures 2.71 cm. Pulmonic Valve: The pulmonic valve was not well visualized. Pulmonic valve regurgitation is trivial. Aorta: The aortic root is normal in size and structure. Venous: The inferior vena cava is normal in size with greater than 50% respiratory variability, suggesting right atrial pressure of 3 mmHg. IAS/Shunts: The atrial septum is grossly normal.  LEFT VENTRICLE PLAX 2D LVIDd:         4.50 cm   Diastology LVIDs:         3.40 cm   LV e' medial:    9.03 cm/s LV PW:         0.70 cm   LV E/e' medial:  7.5 LV IVS:        0.70 cm   LV e' lateral:   10.30 cm/s LVOT diam:     2.00 cm   LV E/e' lateral: 6.6 LV SV:         79 LV SV Index:   43 LVOT Area:     3.14 cm  RIGHT VENTRICLE             IVC RV S prime:     10.00 cm/s  IVC diam: 1.70 cm TAPSE (M-mode): 2.1 cm LEFT ATRIUM             Index        RIGHT ATRIUM           Index LA diam:        4.60 cm 2.52 cm/m   RA Area:     22.90 cm LA Vol (A2C):   66.9 ml 36.70 ml/m  RA  Volume:   71.70 ml  39.33 ml/m LA Vol (A4C):   86.9 ml 47.67 ml/m LA Biplane Vol: 78.3 ml 42.95 ml/m  AORTIC VALVE AV Area (Vmax):    2.97 cm AV Area (Vmean):   2.77 cm AV Area (VTI):     2.71 cm AV Vmax:           145.00 cm/s AV Vmean:          92.100 cm/s AV VTI:            0.292 m AV Peak Grad:      8.4 mmHg AV Mean Grad:      4.0 mmHg LVOT Vmax:         137.00 cm/s LVOT Vmean:        81.100 cm/s LVOT VTI:          0.251 m LVOT/AV VTI ratio: 0.86  AORTA Ao Root diam: 3.20 cm MITRAL VALVE               TRICUSPID VALVE MV Area (PHT): 2.87 cm    TR Peak grad:   30.9 mmHg MV Decel Time: 264 msec    TR Vmax:        278.00 cm/s MV E velocity: 68.10 cm/s MV A velocity: 53.60 cm/s  SHUNTS MV E/A ratio:  1.27        Systemic VTI:  0.25 m                            Systemic Diam: 2.00 cm Gwyndolyn Kaufman MD Electronically signed by Gwyndolyn Kaufman MD Signature Date/Time: 08/28/2021/1:21:46 PM    Final     Labs:  CBC: Recent Labs    08/31/21 0327 09/06/21 1129 09/17/21 0917 09/20/21 0733  WBC 4.0 4.7 4.6 5.7  HGB 9.9* 10.3* 11.3* 13.0  HCT 29.2* 30.7* 34.2* 38.4*  PLT 94* 252 413* 254    COAGS: Recent Labs    07/21/21 0114 07/22/21 0157 08/27/21 2206 09/20/21 0733  INR 1.7* 1.2 1.3* 1.3*  APTT  --   --  32  --     BMP: Recent Labs    08/31/21 0327  09/06/21 1129 09/17/21 0917 09/20/21 0733  NA 133* 137 136 131*  K 3.3* 4.0 3.7 3.5  CL 102 101 98 91*  CO2 26 27 30 29   GLUCOSE 104* 117* 153* 131*  BUN 13 17 16 22   CALCIUM 7.7* 8.9 9.3 8.8*  CREATININE 0.89 1.14 1.23 1.35*  GFRNONAA >60 >60 59* 53*    LIVER FUNCTION TESTS: Recent Labs    08/27/21 2206 09/06/21 1129 09/17/21 0917 09/20/21 0733  BILITOT 2.2* 1.4* 1.1 2.1*  AST 37 80* 44* 143*  ALT 38 70* 33 74*  ALKPHOS 112 201* 282* 233*  PROT 6.6 6.5 6.9 7.0  ALBUMIN 3.1* 3.4* 3.6 3.2*    TUMOR MARKERS: No results for input(s): AFPTM, CEA, CA199, CHROMGRNA in the last 8760 hours.  Assessment and  Plan:  Pancreatic cancer Biliary internal external drain placed in IR 07/22/21 Brushings suspicious for malignancy + EUS biopsy 08/05/21 Drain has been capped for weeks per Dtr Follows with Dr Burr Medico Oncology Scheduled today for Biliary drain injection for evaluation and possible biliary stent placement Risks and benefits of Biliary drain injection and possible biliary stent placement discussed with the patient including, but not limited to bleeding, infection which may lead to sepsis or even death and damage to adjacent structures.  This interventional procedure involves the use of X-rays and because of the nature of the planned procedure, it is possible that we will have prolonged use of X-ray fluoroscopy.  Potential radiation risks to you include (but are not limited to) the following: - A slightly elevated risk for cancer  several years later in life. This risk is typically less than 0.5% percent. This risk is low in comparison to the normal incidence of human cancer, which is 33% for women and 50% for men according to the [employer]. - Radiation induced injury can include skin redness, resembling a rash, tissue breakdown / ulcers and hair loss (which can be temporary or permanent).   The likelihood of either of these occurring depends on the difficulty of the procedure and whether you are sensitive to radiation due to previous procedures, disease, or genetic conditions.   IF your procedure requires a prolonged use of radiation, you will be notified and given written instructions for further action.  It is your responsibility to monitor the irradiated area for the 2 weeks following the procedure and to notify your physician if you are concerned that you have suffered a radiation induced injury.    All of the patient's questions were answered, patient is agreeable to proceed.  Consent signed and in chart.    Thank you for this interesting consult.  I greatly enjoyed meeting  STRATTON VILLWOCK Keller. and look forward to participating in their care.  A copy of this report was sent to the requesting provider on this date.  Electronically Signed: Lavonia Drafts, PA-C 09/20/2021, 9:06 AM   I spent a total of    25 Minutes in face to face in clinical consultation, greater than 50% of which was counseling/coordinating care for Biliary drain placement--- for possible removal and placement biliary stent

## 2021-09-20 NOTE — Progress Notes (Signed)
Patients B/P is 89/57 and asymptomatic. Laurence Ferrari, MD stated to give patient 500 cc bolus and if pressure improves he can be discharged. Verbal orders placed and followed. Will continue to monitor.

## 2021-09-20 NOTE — Procedures (Signed)
Interventional Radiology Procedure Note  Procedure: Successful placement of 10 x 60 mm WalFlex covered biliary stent.    Complications: None  Estimated Blood Loss: None  Recommendations: - Bedrest x 1 hr - DC home   Signed,  Criselda Peaches, MD

## 2021-09-21 ENCOUNTER — Encounter: Payer: Self-pay | Admitting: Hematology

## 2021-09-21 ENCOUNTER — Telehealth: Payer: Self-pay

## 2021-09-21 ENCOUNTER — Inpatient Hospital Stay: Payer: Medicare HMO | Admitting: Hematology

## 2021-09-21 ENCOUNTER — Other Ambulatory Visit: Payer: Self-pay

## 2021-09-21 ENCOUNTER — Telehealth: Payer: Self-pay | Admitting: Hematology

## 2021-09-21 ENCOUNTER — Inpatient Hospital Stay: Payer: Medicare HMO

## 2021-09-21 ENCOUNTER — Other Ambulatory Visit: Payer: Medicare HMO

## 2021-09-21 VITALS — BP 125/65 | HR 65 | Temp 99.5°F | Resp 16

## 2021-09-21 DIAGNOSIS — E860 Dehydration: Secondary | ICD-10-CM

## 2021-09-21 DIAGNOSIS — R112 Nausea with vomiting, unspecified: Secondary | ICD-10-CM

## 2021-09-21 DIAGNOSIS — C250 Malignant neoplasm of head of pancreas: Secondary | ICD-10-CM

## 2021-09-21 DIAGNOSIS — Z5111 Encounter for antineoplastic chemotherapy: Secondary | ICD-10-CM | POA: Diagnosis not present

## 2021-09-21 DIAGNOSIS — Z95828 Presence of other vascular implants and grafts: Secondary | ICD-10-CM

## 2021-09-21 DIAGNOSIS — C251 Malignant neoplasm of body of pancreas: Secondary | ICD-10-CM | POA: Diagnosis not present

## 2021-09-21 HISTORY — DX: Nausea with vomiting, unspecified: R11.2

## 2021-09-21 LAB — CBC WITH DIFFERENTIAL (CANCER CENTER ONLY)
Abs Immature Granulocytes: 0.05 10*3/uL (ref 0.00–0.07)
Basophils Absolute: 0 10*3/uL (ref 0.0–0.1)
Basophils Relative: 0 %
Eosinophils Absolute: 0.3 10*3/uL (ref 0.0–0.5)
Eosinophils Relative: 5 %
HCT: 33.8 % — ABNORMAL LOW (ref 39.0–52.0)
Hemoglobin: 11.3 g/dL — ABNORMAL LOW (ref 13.0–17.0)
Immature Granulocytes: 1 %
Lymphocytes Relative: 21 %
Lymphs Abs: 1.4 10*3/uL (ref 0.7–4.0)
MCH: 27.8 pg (ref 26.0–34.0)
MCHC: 33.4 g/dL (ref 30.0–36.0)
MCV: 83 fL (ref 80.0–100.0)
Monocytes Absolute: 0.1 10*3/uL (ref 0.1–1.0)
Monocytes Relative: 1 %
Neutro Abs: 4.9 10*3/uL (ref 1.7–7.7)
Neutrophils Relative %: 72 %
Platelet Count: 188 10*3/uL (ref 150–400)
RBC: 4.07 MIL/uL — ABNORMAL LOW (ref 4.22–5.81)
RDW: 16.2 % — ABNORMAL HIGH (ref 11.5–15.5)
WBC Count: 6.7 10*3/uL (ref 4.0–10.5)
nRBC: 0 % (ref 0.0–0.2)

## 2021-09-21 LAB — CMP (CANCER CENTER ONLY)
ALT: 60 U/L — ABNORMAL HIGH (ref 0–44)
AST: 85 U/L — ABNORMAL HIGH (ref 15–41)
Albumin: 3.4 g/dL — ABNORMAL LOW (ref 3.5–5.0)
Alkaline Phosphatase: 182 U/L — ABNORMAL HIGH (ref 38–126)
Anion gap: 8 (ref 5–15)
BUN: 24 mg/dL — ABNORMAL HIGH (ref 8–23)
CO2: 30 mmol/L (ref 22–32)
Calcium: 8.8 mg/dL — ABNORMAL LOW (ref 8.9–10.3)
Chloride: 94 mmol/L — ABNORMAL LOW (ref 98–111)
Creatinine: 1.12 mg/dL (ref 0.61–1.24)
GFR, Estimated: >60 mL/min (ref >60)
Glucose, Bld: 200 mg/dL — ABNORMAL HIGH (ref 70–99)
Potassium: 3.3 mmol/L — ABNORMAL LOW (ref 3.5–5.1)
Sodium: 132 mmol/L — ABNORMAL LOW (ref 135–145)
Total Bilirubin: 1.6 mg/dL — ABNORMAL HIGH (ref 0.3–1.2)
Total Protein: 6.7 g/dL (ref 6.5–8.1)

## 2021-09-21 MED ORDER — SODIUM CHLORIDE 0.9 % IV SOLN: Freq: Once | INTRAVENOUS | Status: DC

## 2021-09-21 MED ORDER — SODIUM CHLORIDE 0.9% FLUSH
10.0000 mL | Freq: Once | INTRAVENOUS | Status: AC
  Administered 2021-09-21: 10 mL via INTRAVENOUS

## 2021-09-21 MED ORDER — FAMOTIDINE IN NACL 20-0.9 MG/50ML-% IV SOLN
20.0000 mg | Freq: Once | INTRAVENOUS | Status: AC
  Administered 2021-09-21: 20 mg via INTRAVENOUS
  Filled 2021-09-21: qty 50

## 2021-09-21 MED ORDER — ONDANSETRON HCL 4 MG/2ML IJ SOLN
8.0000 mg | Freq: Once | INTRAMUSCULAR | Status: AC
  Administered 2021-09-21: 8 mg via INTRAVENOUS
  Filled 2021-09-21: qty 4

## 2021-09-21 MED ORDER — SODIUM CHLORIDE 0.9 % IV SOLN: Freq: Once | INTRAVENOUS | Status: AC

## 2021-09-21 MED ORDER — ONDANSETRON HCL 4 MG PO TABS: 4.0000 mg | ORAL_TABLET | Freq: Three times a day (TID) | ORAL | 0 refills | Status: DC | PRN

## 2021-09-21 MED ORDER — HEPARIN SOD (PORK) LOCK FLUSH 100 UNIT/ML IV SOLN
500.0000 [IU] | Freq: Once | INTRAVENOUS | Status: AC
  Administered 2021-09-21: 500 [IU] via INTRAVENOUS

## 2021-09-21 MED ORDER — DEXAMETHASONE 4 MG PO TABS: 4.0000 mg | ORAL_TABLET | Freq: Every day | ORAL | 0 refills | Status: DC

## 2021-09-21 MED ORDER — SODIUM CHLORIDE 0.9 % IV SOLN
10.0000 mg | Freq: Once | INTRAVENOUS | Status: AC
  Administered 2021-09-21: 10 mg via INTRAVENOUS
  Filled 2021-09-21: qty 10

## 2021-09-21 NOTE — Telephone Encounter (Signed)
Spoke with pt's daughter via telephone regarding staff message sent to this RN for follow-up on pt's status post chemotherapy on 09/17/2021.  Pt's daughter stated that the pt had a stent placement done on 09/20/2021; thus, why they did not have the phone visit with Dr. Burr Medico on 09/20/2021.  Pt's daughter stated prior to the stent placement the pt was fatigue, nauseous but denied vomiting, weak, light headed, and not really eating & drinking.  Pt's daughter stated she's tried giving the pt Ensure but the pt's is not drinking half of the bottle of ensure.  Pt c/o everything tasting funny or metallic.  Pt's daughter stated the pt stood up last night to go to the bathroom and got dizzy and fell down trying to sit on the commode.  She stated that the pt landed on his buttocks on the commode seat but is unsure if the pt has bruising on his butt. Recommended that pt comes in for hydration and antiemetics.  Infusion can see pt today for hydration and pt's daughter confirmed infusion appt today.  Dr. Burr Medico will see pt while in infusion today.

## 2021-09-21 NOTE — Progress Notes (Signed)
Verbal order given from Dr. Burr Medico to administer 1L NS over 2hrs, 10mg  IV Dexamethasone, and 8mg  IV Zofran.  Draw CBC w/Diff and CMP with port access.

## 2021-09-21 NOTE — Progress Notes (Signed)
[facility]   [phone] [phone]   Clinic Follow up Note   Patient Care Team: Ronnell Freshwater, NP as PCP - General (Family Medicine) Truitt Merle, MD as Consulting Physician (Oncology) Royston Bake, RN as Oncology Nurse Navigator (Oncology)  Date of Service:  09/21/2021  CHIEF COMPLAINT: fatigue and low appetite   CURRENT THERAPY:  Neoadjuvant chemotherapy gemcitabine and Abraxane on day 1, 8 every 21 days, started with gemcitabine alone on 08/20/21 for first cycle due to hyperbilirubinemia.  ASSESSMENT & PLAN:  Angel Keller. is a 81 y.o. male with   1. 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 -Pancreatic protocol CT scan showed 3.6 x 2.6 cm pancreatic head mass, partially involving multiple vessels, including SMV, portal vein, and hepatic artery.  This is borderline resectable. -Patient was evaluated by Dr. Zenia Keller, who recommends neoadjuvant chemotherapy, which I agree. -he began gemcitabine alone on 08/20/21. He was admitted for fever after cycle 1. Cycle 2 started last week with gem and abraxane. He tolerated poorly overall; he presents today with fatigue, low appetite, and a mild intermittent nausea. -We will give IV fluids, Zofran and dexamethasone in the office today -I did not think he can tolerate gemcitabine and Abraxane together, will change to single agent gemcitabine every 2 weeks, see if he can tolerate. If not, will stop chemo and proceed with radiation.  If he does not tolerate chemotherapy well, he will unlikely to tolerate Whipple surgery.    2. Epigastric pain and obstructive jaundice -Status post PTC, which was removed after stent placement by IR yesterday -T bili is trending down     3. Fatigue, anorexia and nausea   -Secondary to chemotherapy, underlying cancer -Compazine is not very effective, I will call in low-dose Zofran, and a dexamethasone 4 mg daily for 3 to 5 days.  Due to the QT prolongation from mirtazapine and Lexapro, I will be cautious about high-dose Zofran  PLAN -He will receive IV fluids, IV Zofran and dexamethasone -I called in dexa and low dose zofran  -Return for hydration and lab this Friday, hold chemo this week. Due to his previous CHF, will also be cautious with IVF  -lab, f/u and gemcitabine 2/22     No problem-specific Assessment & Plan notes found for this encounter.   SUMMARY OF ONCOLOGIC HISTORY: Oncology History Overview Note   Cancer Staging  Pancreatic cancer [facility]) Staging form: Exocrine Pancreas, AJCC 8th Edition - Clinical stage from 08/05/2021: Stage IB (cT2, cN0, cM0) - Signed by Truitt Merle, MD on 08/17/2021 Stage prefix: Initial diagnosis Total positive nodes: 0     Pancreatic mass  07/18/2021 Initial Diagnosis   Pancreatic mass   07/18/2021 Imaging   CT Abdomen Pelvis W Contrast  IMPRESSION: 1. Suspected solid mass within the proximal body of the pancreas measures 2.3 x 2.6 x 2.0 cm. There is downstream dilation of the main pancreatic duct and its branches. There is also intra and extrahepatic biliary ductal dilation. Further evaluation with MRI of the abdomen, pancreatic protocol, when clinically feasible, may be considered. 2. 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 3. Aortic atherosclerosis   07/20/2021 Procedure   DG ERCP  IMPRESSION: Nondiagnostic ERCP as above. Correlation with the operative report is advised.  - The examination was suspicious for a biliary stricture in  the bile duct. - Examination was suspicious for carcinoma of the head of the pancreas. - Attempts at a cholangiogram failed.   07/22/2021 Pathology Results   CASE: [id number]   FINAL MICROSCOPIC DIAGNOSIS:  - Suspicious for malignancy  - See  comment   DIAGNOSTIC COMMENTS:  There are rare cells with cytologic atypia suspicious for  adenocarcinoma.  Dr. Vic Ripper agrees.    07/22/2021 Procedure   IR INT EXT BILIARY DRAIN WITH CHOLANGIOGRAM ( IMPRESSION: 1. 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 2. Successful brush biopsy of obstructing lesion x3. 3. Successful placement of a 10 French internal/external biliary drainage catheter.   PLAN: 1. Follow bilirubin. When significant downward trend is clear, the bag should be capped. Recommend capping bag before discharge home if possible. 2. Follow-up in IR in 4-6 weeks for initial biliary tube check and exchange. If initial brush biopsies are negative, repeat biopsy could be considered at that time.   Malignant neoplasm of body of pancreas ([place])  08/05/2021 Cancer Staging   Staging form: Exocrine Pancreas, AJCC 8th Edition - Clinical stage from 08/05/2021: Stage IB (cT2, cN0, cM0) - Signed by Truitt Merle, MD on 08/17/2021 Stage prefix: Initial diagnosis Total positive nodes: 0    08/14/2021 Initial Diagnosis   Pancreatic cancer ([place])   08/20/2021 -  Chemotherapy   Patient is on Treatment Plan : PANCREATIC Abraxane / Gemcitabine D1,8,15 q28d        INTERVAL HISTORY:  Angel Celeste Sr. is here for a follow up of pancreatic cancer.  I saw him in the infusion room.  He is accompanied by his daughter.  He received cycle 2 chemotherapy gemcitabine and Abraxane last Friday, and has been very fatigued, with low appetite since then.  He also has mild intermittent nausea, he tried Compazine but is not very effective.  No vomiting.  No fever or chills.  He underwent biliary stent placement through PTC by IR yesterday, and the PTC was removed.  All other systems were reviewed with the patient and are negative.  MEDICAL HISTORY:  Past Medical History:  Diagnosis Date   Aneurysm ([place])    2017   Anxiety    Arthritis     Depression    Family history of breast cancer    Family history of lung cancer    Headache    HLD (hyperlipidemia)    Hx of inguinal hernia repair    Hypertension    Hypothyroidism    Overactive bladder    Pancreatic cancer ([facility])    Pneumonia    Shingles    Sleep apnea    Patient denies   Thoracic ascending aortic aneurysm    mildly dilated 4.0cm per 08/12/21 CT   Thyroid disease    Varicose veins of both lower extremities     SURGICAL HISTORY: Past Surgical History:  Procedure Laterality Date   BILATERAL CARPAL TUNNEL RELEASE     CATARACT EXTRACTION, BILATERAL Bilateral    ELBOW SURGERY Bilateral    ENDOSCOPIC RETROGRADE CHOLANGIOPANCREATOGRAPHY (ERCP) WITH PROPOFOL N/A 07/20/2021   Procedure: ENDOSCOPIC RETROGRADE CHOLANGIOPANCREATOGRAPHY (ERCP) WITH PROPOFOL;  Surgeon: Gatha Mayer, MD;  Location: [facility];  Service: Endoscopy;  Laterality: N/A;   ESOPHAGOGASTRODUODENOSCOPY (EGD) WITH PROPOFOL N/A 08/05/2021   Procedure: ESOPHAGOGASTRODUODENOSCOPY (EGD) WITH PROPOFOL;  Surgeon: Rush Landmark Telford Nab., MD;  Location: [place];  Service: Gastroenterology;  Laterality: N/A;   EUS N/A 08/05/2021   Procedure: UPPER ENDOSCOPIC ULTRASOUND (EUS) RADIAL;  Surgeon: Justice Britain  Brooke Bonito., MD;  Location: [place];  Service: Gastroenterology;  Laterality: N/A;   FINE NEEDLE ASPIRATION  08/05/2021   Procedure: FINE NEEDLE ASPIRATION (FNA) LINEAR;  Surgeon: Irving Copas., MD;  Location: [place];  Service: Gastroenterology;;   INGUINAL HERNIA REPAIR Right 1951   IR ANGIO INTRA EXTRACRAN SEL COM CAROTID INNOMINATE BILAT MOD SED  02/20/2019   IR BILIARY STENT(S) EXISTING ACCESS INC DILATION CATH EXCHANGE  09/20/2021   IR ENDOLUMINAL BX OF BILIARY TREE  07/22/2021   IR GENERIC HISTORICAL  10/28/2016   IR RADIOLOGIST EVAL & MGMT 10/28/2016 [facility]   IR INT EXT BILIARY DRAIN WITH CHOLANGIOGRAM  07/22/2021   PORTACATH PLACEMENT N/A 08/19/2021   Procedure: INSERTION  PORT-A-CATH;  Surgeon: Dwan Bolt, MD;  Location: [facility];  Service: General;  Laterality: N/A;  LMA   RADIOLOGY WITH ANESTHESIA N/A 10/21/2015   Procedure: RADIOLOGY WITH ANESTHESIA;  Surgeon: Luanne Bras, MD;  Location: [place];  Service: Radiology;  Laterality: N/A;   RADIOLOGY WITH ANESTHESIA N/A 02/20/2019   Procedure: [employer];  Surgeon: Luanne Bras, MD;  Location: [place];  Service: Radiology;  Laterality: N/A;   ROTATOR CUFF REPAIR Bilateral    VARICOSE VEIN SURGERY      I have reviewed the social history and family history with the patient and they are unchanged from previous note.  ALLERGIES:  has No Known Allergies.  MEDICATIONS:  Current Outpatient Medications  Medication Sig Dispense Refill   dexamethasone (DECADRON) 4 MG tablet Take 1 tablet (4 mg total) by mouth daily. For 3-5 days after chemo for nausea and fatigue 10 tablet 0   apixaban (ELIQUIS) 5 MG TABS tablet Take 1 tablet (5 mg total) by mouth 2 (two) times daily. 60 tablet 0   aspirin EC 81 MG tablet Take 1 tablet (81 mg total) by mouth daily. Hold while on blood thinner/eliquis for 3 months, then resume (Patient taking differently: Take 81 mg by mouth daily.) 30 tablet 11   bisoprolol-hydrochlorothiazide (ZIAC) 2.5-6.25 MG tablet Take 1 tablet by mouth every morning.     Cholecalciferol (VITAMIN D3) 50 MCG (2000 UT) TABS Take 2,000 Units by mouth daily.     diltiazem (CARDIZEM) 30 MG tablet Take 1 tablet (30 mg total) by mouth every 8 (eight) hours. 90 tablet 0   escitalopram (LEXAPRO) 20 MG tablet Take 20 mg by mouth daily.     famotidine (PEPCID) 20 MG tablet Take 1 tablet (20 mg total) by mouth 2 (two) times daily. 60 tablet 1   feeding supplement (ENSURE ENLIVE / ENSURE PLUS) LIQD Take 237 mLs by mouth 3 (three) times daily between meals. 237 mL 12   hydrochlorothiazide (HYDRODIURIL) 12.5 MG tablet Take 1 tablet po QD for 7 days then as needed 30 tablet 1   mirtazapine (REMERON) 7.5 MG tablet Take 1  tablet (7.5 mg total) by mouth at bedtime. 90 tablet 0   Multiple Vitamin (MULTIVITAMIN WITH MINERALS) TABS tablet Take 1 tablet by mouth daily. 30 tablet 0   mupirocin ointment (BACTROBAN) 2 % Apply to effected area twice daily for next 7 days 22 g 1   Nutritional Supplements (,FEEDING SUPPLEMENT, PROSOURCE PLUS) liquid Take 30 mLs by mouth 2 (two) times daily between meals. 887 mL ML   ondansetron (ZOFRAN) 4 MG tablet Take 1 tablet (4 mg total) by mouth every 8 (eight) hours as needed for nausea or vomiting. 20 tablet 0   oxyCODONE (OXY IR/ROXICODONE) 5 MG immediate release tablet Take 1  tablet (5 mg total) by mouth every 6 (six) hours as needed for moderate pain. 10 tablet 0   pantoprazole (PROTONIX) 40 MG tablet Take 1 tablet (40 mg total) by mouth daily. 30 tablet 2   prochlorperazine (COMPAZINE) 10 MG tablet Take 1 tablet (10 mg total) by mouth every 6 (six) hours as needed (Nausea or vomiting). 30 tablet 1   Sodium Chloride Flush (NORMAL SALINE FLUSH) 0.9 % SOLN Give 10 mLs by tube daily. 300 mL 1   sucralfate (CARAFATE) 1 GM/10ML suspension Take 10 mLs (1 g total) by mouth 2 (two) times daily. 420 mL 2   SYNTHROID 112 MCG tablet Take 1 tablet (112 mcg total) by mouth daily before breakfast. 90 tablet 1   vitamin B-12 (CYANOCOBALAMIN) 1000 MCG tablet Take 1,000 mcg by mouth 2 (two) times daily.     No current facility-administered medications for this visit.    PHYSICAL EXAMINATION: ECOG PERFORMANCE STATUS: 3 - Symptomatic, >50% confined to bed Blood pressure 125/65, heart rate 65, respirate 16, temperature is 99.5, pulse ox 97% on room air  Wt Readings from Last 3 Encounters:  09/20/21 140 lb (63.5 kg)  09/14/21 140 lb 4.8 oz (63.6 kg)  09/07/21 146 lb (66.2 kg)     GENERAL:alert, no distress, chronically ill appearing SKIN: skin color, texture, turgor are normal, no significant lesions EYES: normal, Conjunctiva are pink and non-injected, sclera clear LUNGS: clear to auscultation  and percussion with normal breathing effort HEART: regular rate & rhythm and no murmurs, no leg edema  NEURO: alert & oriented x 3 with fluent speech, no focal motor/sensory deficits  LABORATORY DATA:  I have reviewed the data as listed CBC Latest Ref Rng & Units 09/21/2021 09/20/2021 09/17/2021  WBC 4.0 - 10.5 K/uL 6.7 5.7 4.6  Hemoglobin 13.0 - 17.0 g/dL 11.3(L) 13.0 11.3(L)  Hematocrit 39.0 - 52.0 % 33.8(L) 38.4(L) 34.2(L)  Platelets 150 - 400 K/uL 188 254 413(H)     CMP Latest Ref Rng & Units 09/21/2021 09/20/2021 09/17/2021  Glucose 70 - 99 mg/dL 200(H) 131(H) 153(H)  BUN 8 - 23 mg/dL 24(H) 22 16  Creatinine 0.61 - 1.24 mg/dL 1.12 1.35(H) 1.23  Sodium 135 - 145 mmol/L 132(L) 131(L) 136  Potassium 3.5 - 5.1 mmol/L 3.3(L) 3.5 3.7  Chloride 98 - 111 mmol/L 94(L) 91(L) 98  CO2 22 - 32 mmol/L '30 29 30  ' Calcium 8.9 - 10.3 mg/dL 8.8(L) 8.8(L) 9.3  Total Protein 6.5 - 8.1 g/dL 6.7 7.0 6.9  Total Bilirubin 0.3 - 1.2 mg/dL 1.6(H) 2.1(H) 1.1  Alkaline Phos 38 - 126 U/L 182(H) 233(H) 282(H)  AST 15 - 41 U/L 85(H) 143(H) 44(H)  ALT 0 - 44 U/L 60(H) 74(H) 33      RADIOGRAPHIC STUDIES: I have personally reviewed the radiological images as listed and agreed with the findings in the report. IR BILIARY STENT(S) EXISTING ACCESS INC DILATION CATH EXCHANGE  Result Date: 09/20/2021 INDICATION: 81 year old male with pancreatic cancer and malignant obstructed jaundice. A percutaneous internal/external biliary drain was placed on 07/22/2021. Patient presents today for internalization with placement of an self expanding metallic biliary stent. EXAM: Biliary stent placement MEDICATIONS: 2 g Ancef; The antibiotic was administered within an appropriate time frame prior to the initiation of the procedure. ANESTHESIA/SEDATION: Moderate (conscious) sedation was employed during this procedure. A total of Versed 1 mg and Fentanyl 50 mcg was administered intravenously by the radiology nurse. Total intra-service moderate  Sedation Time: 15 minutes. The patient's level of consciousness  and vital signs were monitored continuously by radiology nursing throughout the procedure under my direct supervision. FLUOROSCOPY: Radiation Exposure Index (as provided by the fluoroscopic device): 45 mGy Kerma COMPLICATIONS: None immediate. PROCEDURE: Informed written consent was obtained from the patient after a thorough discussion of the procedural risks, benefits and alternatives. All questions were addressed. Maximal Sterile Barrier Technique was utilized including caps, mask, sterile gowns, sterile gloves, sterile drape, hand hygiene and skin antiseptic. A timeout was performed prior to the initiation of the procedure. Local anesthesia was attained by infiltration with 1% lidocaine. The retention suture was cut. The tube was transected and removed over a Bentson wire. A 5 French Amplatz catheter was advanced over the wire and used to manipulate the wire into the first portion of the jejunum. The catheter was removed. An 8 [place] Brite tip sheath was advanced over the wire and into the duodenum. A pull-back through the sheath cholangiogram was then performed outlining the malignant stricture. A wall flex self expanding covered biliary stent measuring 10 mm in diameter by 6 cm in length was selected. The stent was advanced over the wire and deployed across the malignant stricture. The sheath was reintroduced. Contrast injection demonstrates excellent positioning of the stent with excellent drainage of injected contrast material through the stent. The decision was made to proceed with removal of the sheath and wire. Given performance of the stent, it was felt that there was no need to leave a biliary drain behind. IMPRESSION: Successful placement of 10 x 60 covered WalFlex biliary stent. Electronically Signed   By: Jacqulynn Cadet M.D.   On: 09/20/2021 10:41      No orders of the defined types were placed in this encounter.  All questions  were answered. The patient knows to call the clinic with any problems, questions or concerns. No barriers to learning was detected. The total time spent in the appointment was 30 minutes.     Truitt Merle, MD 09/21/2021

## 2021-09-21 NOTE — Patient Instructions (Signed)

## 2021-09-21 NOTE — Telephone Encounter (Signed)
Sch per 2/7 staff msg, left msg with pt asking if they wanted to see Dr.Feng this week, instructed pt to call scheduling line if needed.

## 2021-09-22 ENCOUNTER — Telehealth: Payer: Self-pay | Admitting: Hematology

## 2021-09-22 NOTE — Telephone Encounter (Signed)
Scheduled follow-up appointment per 2/7 los. Patient's daughter is aware.

## 2021-09-23 ENCOUNTER — Other Ambulatory Visit: Payer: Self-pay

## 2021-09-23 MED FILL — Dexamethasone Sodium Phosphate Inj 100 MG/10ML: INTRAMUSCULAR | Qty: 1 | Status: AC

## 2021-09-24 ENCOUNTER — Other Ambulatory Visit: Payer: Self-pay

## 2021-09-24 ENCOUNTER — Inpatient Hospital Stay: Payer: Medicare HMO

## 2021-09-24 VITALS — BP 112/68 | HR 62 | Temp 98.2°F | Resp 18

## 2021-09-24 DIAGNOSIS — R112 Nausea with vomiting, unspecified: Secondary | ICD-10-CM

## 2021-09-24 DIAGNOSIS — E860 Dehydration: Secondary | ICD-10-CM

## 2021-09-24 DIAGNOSIS — C250 Malignant neoplasm of head of pancreas: Secondary | ICD-10-CM

## 2021-09-24 DIAGNOSIS — Z95828 Presence of other vascular implants and grafts: Secondary | ICD-10-CM

## 2021-09-24 DIAGNOSIS — Z5111 Encounter for antineoplastic chemotherapy: Secondary | ICD-10-CM | POA: Diagnosis not present

## 2021-09-24 LAB — CMP (CANCER CENTER ONLY)
ALT: 65 U/L — ABNORMAL HIGH (ref 0–44)
AST: 70 U/L — ABNORMAL HIGH (ref 15–41)
Albumin: 3.3 g/dL — ABNORMAL LOW (ref 3.5–5.0)
Alkaline Phosphatase: 157 U/L — ABNORMAL HIGH (ref 38–126)
Anion gap: 7 (ref 5–15)
BUN: 27 mg/dL — ABNORMAL HIGH (ref 8–23)
CO2: 31 mmol/L (ref 22–32)
Calcium: 8.8 mg/dL — ABNORMAL LOW (ref 8.9–10.3)
Chloride: 99 mmol/L (ref 98–111)
Creatinine: 1.03 mg/dL (ref 0.61–1.24)
GFR, Estimated: >60 mL/min (ref >60)
Glucose, Bld: 172 mg/dL — ABNORMAL HIGH (ref 70–99)
Potassium: 3.8 mmol/L (ref 3.5–5.1)
Sodium: 137 mmol/L (ref 135–145)
Total Bilirubin: 0.9 mg/dL (ref 0.3–1.2)
Total Protein: 6.5 g/dL (ref 6.5–8.1)

## 2021-09-24 LAB — CBC WITH DIFFERENTIAL (CANCER CENTER ONLY)
Abs Immature Granulocytes: 0.04 10*3/uL (ref 0.00–0.07)
Basophils Absolute: 0 10*3/uL (ref 0.0–0.1)
Basophils Relative: 1 %
Eosinophils Absolute: 0.3 10*3/uL (ref 0.0–0.5)
Eosinophils Relative: 5 %
HCT: 32 % — ABNORMAL LOW (ref 39.0–52.0)
Hemoglobin: 10.7 g/dL — ABNORMAL LOW (ref 13.0–17.0)
Immature Granulocytes: 1 %
Lymphocytes Relative: 26 %
Lymphs Abs: 1.4 10*3/uL (ref 0.7–4.0)
MCH: 27.6 pg (ref 26.0–34.0)
MCHC: 33.4 g/dL (ref 30.0–36.0)
MCV: 82.5 fL (ref 80.0–100.0)
Monocytes Absolute: 0.4 10*3/uL (ref 0.1–1.0)
Monocytes Relative: 8 %
Neutro Abs: 3.2 10*3/uL (ref 1.7–7.7)
Neutrophils Relative %: 59 %
Platelet Count: 171 10*3/uL (ref 150–400)
RBC: 3.88 MIL/uL — ABNORMAL LOW (ref 4.22–5.81)
RDW: 16 % — ABNORMAL HIGH (ref 11.5–15.5)
WBC Count: 5.4 10*3/uL (ref 4.0–10.5)
nRBC: 0.4 % — ABNORMAL HIGH (ref 0.0–0.2)

## 2021-09-24 MED ORDER — SODIUM CHLORIDE 0.9 % IV SOLN
10.0000 mg | Freq: Once | INTRAVENOUS | Status: AC
  Administered 2021-09-24: 10 mg via INTRAVENOUS
  Filled 2021-09-24: qty 10

## 2021-09-24 MED ORDER — SODIUM CHLORIDE 0.9% FLUSH: 10.0000 mL | INTRAVENOUS | Status: DC | PRN

## 2021-09-24 MED ORDER — SODIUM CHLORIDE 0.9 % IV SOLN: Freq: Once | INTRAVENOUS | Status: AC

## 2021-09-24 MED ORDER — ONDANSETRON HCL 4 MG/2ML IJ SOLN
8.0000 mg | Freq: Once | INTRAMUSCULAR | Status: AC
  Administered 2021-09-24: 8 mg via INTRAVENOUS
  Filled 2021-09-24: qty 4

## 2021-09-24 MED ORDER — SODIUM CHLORIDE 0.9 % IV SOLN: Freq: Once | INTRAVENOUS | Status: DC

## 2021-09-24 NOTE — Patient Instructions (Signed)

## 2021-09-25 LAB — CANCER ANTIGEN 19-9: CA 19-9: 62 U/mL — ABNORMAL HIGH (ref 0–35)

## 2021-09-27 ENCOUNTER — Other Ambulatory Visit: Payer: Self-pay

## 2021-09-27 ENCOUNTER — Other Ambulatory Visit (HOSPITAL_COMMUNITY): Payer: Self-pay

## 2021-09-27 ENCOUNTER — Ambulatory Visit (HOSPITAL_COMMUNITY)
Admission: RE | Admit: 2021-09-27 | Discharge: 2021-09-27 | Disposition: A | Discharge status: Home / Self Care | Payer: Medicare HMO | Source: Ambulatory Visit | Attending: Interventional Radiology | Admitting: Interventional Radiology

## 2021-09-27 DIAGNOSIS — I671 Cerebral aneurysm, nonruptured: Secondary | ICD-10-CM | POA: Diagnosis present

## 2021-09-27 DIAGNOSIS — Z08 Encounter for follow-up examination after completed treatment for malignant neoplasm: Secondary | ICD-10-CM | POA: Diagnosis not present

## 2021-09-27 DIAGNOSIS — I729 Aneurysm of unspecified site: Secondary | ICD-10-CM

## 2021-09-29 ENCOUNTER — Other Ambulatory Visit: Payer: Self-pay

## 2021-09-29 ENCOUNTER — Other Ambulatory Visit: Payer: Self-pay | Admitting: Hematology

## 2021-09-29 ENCOUNTER — Ambulatory Visit (HOSPITAL_BASED_OUTPATIENT_CLINIC_OR_DEPARTMENT_OTHER): Payer: Medicare HMO | Admitting: Cardiology

## 2021-09-29 ENCOUNTER — Encounter (HOSPITAL_BASED_OUTPATIENT_CLINIC_OR_DEPARTMENT_OTHER): Payer: Self-pay | Admitting: Cardiology

## 2021-09-29 VITALS — BP 124/62 | HR 55 | Ht 69.0 in | Wt 141.0 lb

## 2021-09-29 DIAGNOSIS — I10 Essential (primary) hypertension: Secondary | ICD-10-CM

## 2021-09-29 DIAGNOSIS — I480 Paroxysmal atrial fibrillation: Secondary | ICD-10-CM

## 2021-09-29 DIAGNOSIS — Z09 Encounter for follow-up examination after completed treatment for conditions other than malignant neoplasm: Secondary | ICD-10-CM | POA: Diagnosis not present

## 2021-09-29 DIAGNOSIS — C259 Malignant neoplasm of pancreas, unspecified: Secondary | ICD-10-CM | POA: Diagnosis not present

## 2021-09-29 MED ORDER — DILTIAZEM HCL 30 MG PO TABS: 30.0000 mg | ORAL_TABLET | Freq: Three times a day (TID) | ORAL | 0 refills | Status: DC | PRN

## 2021-09-29 NOTE — Progress Notes (Signed)
Cardiology Office Note:    Date:  09/29/2021   ID:  Angel Keller., DOB 04/24/1941, MRN [id number]  PCP:  Angel Freshwater, NP  Cardiologist:  Angel Dresser, MD  Referring MD: Angel Freshwater, NP   CC: new patient evaluation for atrial fibrillation  History of Present Illness:    Angel Keller. is a 81 y.o. male with a hx of hypertension, hyperlipidemia, pancreatic cancer, sleep apnea, and anxiety who is seen as a new consult at the request of Angel Freshwater, NP for the evaluation and management of paroxysmal atrial fibrillation.  He was hospitalized 08/27/21 for fever. The fever started the evening after his chemotherapy. His daughter called 911 when he showed weakness and a temperature of 101. While in the ED, his blood pressure dropped to 80/40 and he required IV fluid. Although he had no source of infection, he was started on broad spectrum antibiotics for suspected sepsis. EKG at this time showed atrial fibrillation. He reported he was on Eliquis in the past but had not taken it in a few years. Blood and urine cultures were positive for streptococcus and infection. He was started on Eliquis and diltiazem and was discharged with amoxicillin.   He followed-up with Dr. Junius Creamer 09/07/21. He continued to feel tired and weak. He was referred to cardiology for paroxysmal atrial fibrillation found during his hospital visit.   Today: He is doing alright and accompanied by his daughter who had to leave early. His appetite is improving and he has gained 1 pound. He is drinking Ensure and eating backed potatoes. He reports feeling hunger pangs for the first time since his cancer diagnosis.   He endorses occasional lightheadedness and LE swelling. He does not record his blood pressure at home because he does not have a home cuff.  He was told in the past he had a slow heart rate because he used to work out 3 days a week. Currently, he endorses shortness of breath and low stamina. He  is off of aspirin and tolerating Eliquis. He reports he was taking Eliquis in the past but is unsure of why. He used to take diltiazem every 8 hours.  On review of his chart, he was found to have an age indeterminate DVT 07/2020 and recommended for three months of apixaban.  He does not have chemotherapy this week. Reportedly, he has 2 more rounds of chemotherapy. After chemotherapy, he reports feeling nauseous and ill.   Denies chest pain. No PND, orthopnea, or unexpected weight gain. No syncope or palpitations.  Past Medical History:  Diagnosis Date   Aneurysm ([place])    2017   Anxiety    Arthritis    Depression    Family history of breast cancer    Family history of lung cancer    Headache    HLD (hyperlipidemia)    Hx of inguinal hernia repair    Hypertension    Hypothyroidism    Overactive bladder    Pancreatic cancer (HCC)    Pneumonia    Shingles    Sleep apnea    Patient denies   Thoracic ascending aortic aneurysm    mildly dilated 4.0cm per 08/12/21 CT   Thyroid disease    Varicose veins of both lower extremities     Past Surgical History:  Procedure Laterality Date   BILATERAL CARPAL TUNNEL RELEASE     CATARACT EXTRACTION, BILATERAL Bilateral    ELBOW SURGERY Bilateral    ENDOSCOPIC RETROGRADE CHOLANGIOPANCREATOGRAPHY (  ERCP) WITH PROPOFOL N/A 07/20/2021   Procedure: ENDOSCOPIC RETROGRADE CHOLANGIOPANCREATOGRAPHY (ERCP) WITH PROPOFOL;  Surgeon: Angel Mayer, MD;  Location: [facility] ENDOSCOPY;  Service: Endoscopy;  Laterality: N/A;   ESOPHAGOGASTRODUODENOSCOPY (EGD) WITH PROPOFOL N/A 08/05/2021   Procedure: ESOPHAGOGASTRODUODENOSCOPY (EGD) WITH PROPOFOL;  Surgeon: Angel Landmark Telford Nab., MD;  Location: [place];  Service: Gastroenterology;  Laterality: N/A;   EUS N/A 08/05/2021   Procedure: UPPER ENDOSCOPIC ULTRASOUND (EUS) RADIAL;  Surgeon: Angel Copas., MD;  Location: [place];  Service: Gastroenterology;  Laterality: N/A;   FINE NEEDLE ASPIRATION   08/05/2021   Procedure: FINE NEEDLE ASPIRATION (FNA) LINEAR;  Surgeon: Angel Copas., MD;  Location: [place];  Service: Gastroenterology;;   INGUINAL HERNIA REPAIR Right 1951   IR ANGIO INTRA EXTRACRAN SEL COM CAROTID INNOMINATE BILAT MOD SED  02/20/2019   IR BILIARY STENT(S) EXISTING ACCESS INC DILATION CATH EXCHANGE  09/20/2021   IR ENDOLUMINAL BX OF BILIARY TREE  07/22/2021   IR GENERIC HISTORICAL  10/28/2016   IR RADIOLOGIST EVAL & MGMT 10/28/2016 [facility]INTER[facility] RAD   IR INT EXT BILIARY DRAIN WITH CHOLANGIOGRAM  07/22/2021   PORTACATH PLACEMENT N/A 08/19/2021   Procedure: INSERTION PORT-A-CATH;  Surgeon: Angel Bolt, MD;  Location: [facility];  Service: General;  Laterality: N/A;  LMA   RADIOLOGY WITH ANESTHESIA N/A 10/21/2015   Procedure: RADIOLOGY WITH ANESTHESIA;  Surgeon: Angel Bras, MD;  Location: [place];  Service: Radiology;  Laterality: N/A;   RADIOLOGY WITH ANESTHESIA N/A 02/20/2019   Procedure: Angel Keller;  Surgeon: Angel Bras, MD;  Location: [place];  Service: Radiology;  Laterality: N/A;   ROTATOR CUFF REPAIR Bilateral    VARICOSE VEIN SURGERY      Current Medications: Current Outpatient Medications on File Prior to Visit  Medication Sig   apixaban (ELIQUIS) 5 MG TABS tablet Take 1 tablet (5 mg total) by mouth 2 (two) times daily.   bisoprolol-hydrochlorothiazide (ZIAC) 2.5-6.25 MG tablet Take 1 tablet by mouth every morning.   Cholecalciferol (VITAMIN D3) 50 MCG (2000 UT) TABS Take 2,000 Units by mouth daily.   dexamethasone (DECADRON) 4 MG tablet Take 1 tablet (4 mg total) by mouth daily. For 3-5 days after chemo for nausea and fatigue   escitalopram (LEXAPRO) 20 MG tablet Take 20 mg by mouth daily.   famotidine (PEPCID) 20 MG tablet Take 1 tablet (20 mg total) by mouth 2 (two) times daily.   feeding supplement (ENSURE ENLIVE / ENSURE PLUS) LIQD Take 237 mLs by mouth 3 (three) times daily between meals.   hydrochlorothiazide (HYDRODIURIL) 12.5 MG tablet Take 1  tablet po QD for 7 days then as needed   mirtazapine (REMERON) 7.5 MG tablet Take 1 tablet (7.5 mg total) by mouth at bedtime.   Multiple Vitamin (MULTIVITAMIN WITH MINERALS) TABS tablet Take 1 tablet by mouth daily.   mupirocin ointment (BACTROBAN) 2 % Apply to effected area twice daily for next 7 days   Nutritional Supplements (,FEEDING SUPPLEMENT, PROSOURCE PLUS) liquid Take 30 mLs by mouth 2 (two) times daily between meals.   ondansetron (ZOFRAN) 4 MG tablet Take 1 tablet (4 mg total) by mouth every 8 (eight) hours as needed for nausea or vomiting.   oxyCODONE (OXY IR/ROXICODONE) 5 MG immediate release tablet Take 1 tablet (5 mg total) by mouth every 6 (six) hours as needed for moderate pain.   pantoprazole (PROTONIX) 40 MG tablet Take 1 tablet (40 mg total) by mouth daily.   prochlorperazine (COMPAZINE) 10 MG tablet Take 1 tablet (10  mg total) by mouth every 6 (six) hours as needed (Nausea or vomiting).   Sodium Chloride Flush (NORMAL SALINE FLUSH) 0.9 % SOLN Give 10 mLs by tube daily.   sucralfate (CARAFATE) 1 GM/10ML suspension Take 10 mLs (1 g total) by mouth 2 (two) times daily.   SYNTHROID 112 MCG tablet Take 1 tablet (112 mcg total) by mouth daily before breakfast.   vitamin B-12 (CYANOCOBALAMIN) 1000 MCG tablet Take 1,000 mcg by mouth 2 (two) times daily.   No current facility-administered medications on file prior to visit.     Allergies:   Patient has no known allergies.   Social History   Tobacco Use   Smoking status: Former    Packs/day: 0.25    Years: 1.00    Pack years: 0.25    Types: Cigarettes    Quit date: 08/15/1961    Years since quitting: 60.1   Smokeless tobacco: Never  Vaping Use   Vaping Use: Never used  Substance Use Topics   Alcohol use: No   Drug use: No    Family History: family history includes Alcoholism in his sister; Breast cancer in his mother; Cirrhosis in his sister; Lung cancer in his father. There is no history of Colon cancer, Esophageal  cancer, Liver cancer, Pancreatic cancer, or Prostate cancer.  ROS:   Please see the history of present illness.  Additional pertinent ROS: Constitutional: Negative for chills, fever, night sweats, unintentional weight loss (has had weight loss but now stablizing/gained a pound) HENT: Negative for ear pain and hearing loss.   Eyes: Negative for loss of vision and eye pain.  Respiratory: Negative for cough, sputum, wheezing.  Positive for shortness of breath Cardiovascular: See HPI. Gastrointestinal: Negative for abdominal pain, melena, and hematochezia.  Genitourinary: Negative for dysuria and hematuria.  Musculoskeletal: Negative for falls and myalgias. Positive for LE edema Skin: Negative for itching and rash.  Neurological: Negative for focal weakness, focal sensory changes and loss of consciousness. Positive for lightheadedness Endo/Heme/Allergies: Does not bruise/bleed easily.    EKGs/Labs/Other Studies Reviewed:    The following studies were reviewed today: Echo 08/28/21  1. Left ventricular ejection fraction, by estimation, is 60 to 65%. The  left ventricle has normal function. The left ventricle has no regional  wall motion abnormalities. Left ventricular diastolic parameters are  consistent with Grade I diastolic  dysfunction (impaired relaxation).   2. Right ventricular systolic function is normal. The right ventricular  size is normal. There is normal pulmonary artery systolic pressure.   3. Left atrial size was mildly dilated.   4. Right atrial size was mildly dilated.   5. The mitral valve is grossly normal. Trivial mitral valve  regurgitation.   6. The aortic valve is tricuspid. There is mild calcification of the  aortic valve. There is mild thickening of the aortic valve. Aortic valve  regurgitation is not visualized. Aortic valve sclerosis/calcification is  present, without any evidence of  aortic stenosis.   7. The inferior vena cava is normal in size with greater  than 50%  respiratory variability, suggesting right atrial pressure of 3 mmHg.   Comparison(s): No prior Echocardiogram.  Lower Venous DVT 07/28/20 RIGHT:  - Findings consistent with age indeterminate deep vein thrombosis  involving the right popliteal vein.  - No cystic structure found in the popliteal fossa.     LEFT:  - There is no evidence of deep vein thrombosis in the lower extremity.  - No cystic structure found in the popliteal fossa.  EKG:  EKG is personally reviewed.   09/29/21: sinus bradycardia at 55 bpm  Recent Labs: 07/27/2021: TSH 4.060 08/28/2021: Magnesium 2.1 09/24/2021: ALT 65; BUN 27; Creatinine 1.03; Hemoglobin 10.7; Platelet Count 171; Potassium 3.8; Sodium 137  Recent Lipid Panel    Component Value Date/Time   CHOL (H) 07/10/2009 0540    257        ATP III CLASSIFICATION:  <200     mg/dL   Desirable  200-239  mg/dL   Borderline High  >=240    mg/dL   High          TRIG 125 07/27/2020 1730   TRIG 150 (H) 07/11/2006 0731   HDL 44 07/10/2009 0540   CHOLHDL 5.8 07/10/2009 0540   VLDL 53 (H) 07/10/2009 0540   LDLCALC (H) 07/10/2009 0540    160        Total Cholesterol/HDL:CHD Risk Coronary Heart Disease Risk Table                     Men   Women  1/2 Average Risk   3.4   3.3  Average Risk       5.0   4.4  2 X Average Risk   9.6   7.1  3 X Average Risk  23.4   11.0        Use the calculated Patient Ratio above and the CHD Risk Table to determine the patient's CHD Risk.        ATP III CLASSIFICATION (LDL):  <100     mg/dL   Optimal  100-129  mg/dL   Near or Above                    Optimal  130-159  mg/dL   Borderline  160-189  mg/dL   High  >190     mg/dL   Very High   LDLDIRECT 149.7 11/07/2008 0000    Physical Exam:    VS:  BP 124/62    Pulse (!) 55    Ht 5\' 9"  (1.753 m)    Wt 141 lb (64 kg)    BMI 20.82 kg/m     Wt Readings from Last 3 Encounters:  09/29/21 141 lb (64 kg)  09/20/21 140 lb (63.5 kg)  09/14/21 140 lb 4.8 oz (63.6 kg)     GEN: Frail appearing, thin elderly gentleman with temporal muscle wasting, in no acute distress HEENT: Normal, moist mucous membranes NECK: No JVD CARDIAC: regular rhythm, normal S1 and S2, no rubs or gallops. No murmur. VASCULAR: Radial and DP pulses 2+ bilaterally. No carotid bruits RESPIRATORY:  Clear to auscultation without rales, wheezing or rhonchi  ABDOMEN: Soft, non-tender, non-distended MUSCULOSKELETAL:  Ambulates independently SKIN: Warm and dry, trivial edema NEUROLOGIC:  Alert and oriented x 3. No focal neuro deficits noted. PSYCHIATRIC:  Normal affect    ASSESSMENT:    1. Paroxysmal atrial fibrillation (HCC)   2. Hospital discharge follow-up   3. Essential hypertension   4. Pancreatic adenocarcinoma [facility])    PLAN:    Paroxysmal atrial fibrillation, in the setting of sepsis/bacteremia Hospital discharge follow up -has not been told previously that he has any rhythm issues -tolerating apixaban 5 mg BID. Was on in the past for DVT 07/2020. Confirmed that he is not taking aspirin currently -CHA2DS2/VAS Stroke Risk Points= 3  -it is reasonable to continue apixaban, especially given hypercoagulability from cancer, but if needed this can be held/stopped for bleeding or  procedures -heart rate 55, will drop diltiazem from 30 mg every 8 hours to PRN -already on bisoprolol  Hypertension -well controlled today -history of hypotension -given his cancer/poor oral intake, would consider dropping HCTZ in the future to avoid dehydration and electrolyte issues if needed. LE is trivial, which is chronic, but HCTZ may be helping with this. Would cautiously monitor.  Pancreatic adenocarcinoma, stage 1b -neoadjuvant chemo with gemcitabine/abraxane initially, now gemcitabine alone -being evaluated for surgery based on response to chemo -complications of weight loss/poor appetite, starting to stabilize/improve  Plan for follow up: 3 months or sooner as needed  Angel Dresser, MD, PhD, [facility]    Medication Adjustments/Labs and Tests Ordered: Current medicines are reviewed at length with the patient today.  Concerns regarding medicines are outlined above.  Orders Placed This Encounter  Procedures   EKG 12-Lead   Meds ordered this encounter  Medications   diltiazem (CARDIZEM) 30 MG tablet    Sig: Take 1 tablet (30 mg total) by mouth every 8 (eight) hours as needed (heart rate fast (more than 100 beats/min at rest)).    Dispense:  90 tablet    Refill:  0    Patient Instructions  Medication Instructions:  Your Physician recommend you continue on your current medication as directed.    *If you need a refill on your cardiac medications before your next appointment, please call your pharmacy*   Lab Work: None ordered today   Testing/Procedures: None ordered today    Follow-Up: At [facility], you and your health needs are our priority.  As part of our continuing mission to provide you with exceptional heart care, we have created designated Provider Care Teams.  These Care Teams include your primary Cardiologist (physician) and Advanced Practice Providers (APPs -  Physician Assistants and Nurse Practitioners) who all work together to provide you with the care you need, when you need it.  We recommend signing up for the patient portal called "MyChart".  Sign up information is provided on this After Visit Summary.  MyChart is used to connect with patients for Virtual Visits (Telemedicine).  Patients are able to view lab/test results, encounter notes, upcoming appointments, etc.  Non-urgent messages can be sent to your provider as well.   To learn more about what you can do with MyChart, go to [email or web address].    Your next appointment:   3 month(s)  The format for your next appointment:   In Person  Provider:   Buford Dresser, MD{  Other Instructions Make sure no aspirin. Continue apixaban  (eliquis) Change diltiazem from every 8 hours to every 8 hours as needed if heart is racing (or resting heart rate is more than 100 beats/min). Watch for blood in your stool or blood in your urine.   I,Mykaella Javier,acting as a Education administrator for [employer], MD.,have documented all relevant documentation on the behalf of Angel Dresser, MD,as directed by  Angel Dresser, MD while in the presence of Angel Dresser, MD.  I, Angel Dresser, MD, have reviewed all documentation for this visit. The documentation on 09/29/21 for the exam, diagnosis, procedures, and orders are all accurate and complete.   Signed, Angel Dresser, MD PhD 09/29/2021 3:49 PM    [place]

## 2021-09-29 NOTE — Patient Instructions (Signed)
Medication Instructions:  Your Physician recommend you continue on your current medication as directed.    *If you need a refill on your cardiac medications before your next appointment, please call your pharmacy*   Lab Work: None ordered today   Testing/Procedures: None ordered today    Follow-Up: At [facility], you and your health needs are our priority.  As part of our continuing mission to provide you with exceptional heart care, we have created designated Provider Care Teams.  These Care Teams include your primary Cardiologist (physician) and Advanced Practice Providers (APPs -  Physician Assistants and Nurse Practitioners) who all work together to provide you with the care you need, when you need it.  We recommend signing up for the patient portal called "MyChart".  Sign up information is provided on this After Visit Summary.  MyChart is used to connect with patients for Virtual Visits (Telemedicine).  Patients are able to view lab/test results, encounter notes, upcoming appointments, etc.  Non-urgent messages can be sent to your provider as well.   To learn more about what you can do with MyChart, go to [email or web address].    Your next appointment:   3 month(s)  The format for your next appointment:   In Person  Provider:   Buford Dresser, MD{  Other Instructions Make sure no aspirin. Continue apixaban (eliquis) Change diltiazem from every 8 hours to every 8 hours as needed if heart is racing (or resting heart rate is more than 100 beats/min). Watch for blood in your stool or blood in your urine.

## 2021-09-30 ENCOUNTER — Telehealth: Payer: Self-pay | Admitting: Licensed Clinical Social Worker

## 2021-09-30 ENCOUNTER — Telehealth (HOSPITAL_COMMUNITY): Payer: Self-pay

## 2021-09-30 NOTE — Telephone Encounter (Signed)
Revealed negative genetic testing.  This normal result is reassuring and indicates that it is unlikely Angel Keller cancer is due to a hereditary cause.  It is unlikely that there is an increased risk of another cancer due to a mutation in one of these genes.  However, genetic testing is not perfect, and cannot definitively rule out a hereditary cause.  It will be important for him to keep in contact with genetics to learn if any additional testing may be needed in the future.

## 2021-09-30 NOTE — Telephone Encounter (Signed)
Pt agreed to f/u in 6 months with an mra. AW 

## 2021-10-01 NOTE — Discharge Summary (Signed)
Physician Discharge Summary   Patient: Angel SALADIN Sr. MRN: [id number] DOB: [birth year]  Admit date:     08/27/2021  Discharge date: 08/31/2021  Discharge Physician: Karie Kirks   PCP: Ronnell Freshwater, NP   Recommendations at discharge:    Discharge to home Follow up with PCP in 7-10 days. Chemistry to be drawn on that visit to be reported to PCP. Follow up with oncology as directed.  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 [facility] Course: The patient is an 81 yr old man who was admitted earlier this morning by my colleague, Dr. Tonie Griffith. He [facility] after a fever that began on the night of 08/27/2021. The patient has pancreatic cancer and had chemotherapy Gemzar as treatment prior to developing fever. When he arrived in the ED he was hypotensive with BP 80/40 which responded to IV fluids. No source of infection was found, but he was started on broad spectrum IV antibiotics.    [facility] were consulted to admit the patient for further evaluation and care.   He is receiving cefepime for treatment of unknown infectious source. He converted to atrial fibrillation at 0500 this morning. HR 99-134 this morning. BP too low for diltiazem or orther rate limiting med. Patient is asymptomatic. He has been started on Eliquis.   1/2 Blood cultures has grown out strep oralis. Blood cultures have been repeated. Surveillance cultures have had no growth.   Infectious disease was consulted. Dr. Linus Salmons evaluated the patient. He recommended that the patient could be discharged to home with oral amoxicillin 500 mg tid for 7 days from the  date of negative blood cultures (1/15). The patient was discharged on day 3/7.  He was discharged to home in fair condition.  Assessment and Plan: AF (paroxysmal atrial fibrillation): Mr. Rammel is admitted to stepdown unit with a-fib and SIRS.  Started on eliquis for anticoagulation with a-fib. Echocardiogram has demonstrated EF of 60-65%, normal LV function, no regional wall motion abnormalities. There was Grade 1 diastolic dysfunction. He has normal RV size and function. Normal pulmonary artery systolic pressure. Right and left atria are dilated. Tricuspid tricuspid valve. Sclerosis/calcification is present. Heart rate is now controlled on diltiazem 30 mg qid.   Bacteremia: 1/2 blood cultures collected on 08/27/2021 grew out strep oralis. Pt is on cefepime. Surveillance cultures have had no growth. ID has been consulted.    Hypotension Resolved. While in the ER the BP dropped to 80/40 range. Was given IVF bolus which brought BP up to 108/60 range. BP improved on 08/28/2021, but SBP in the 70's on 1/15. 1L bolus given. Pt is in Atrial fibrillation. HR in the 70's. Will continue to monitor BP.   SIRS (systemic inflammatory response syndrome): Continued fever and hypotension. 1/2 blood cultures from 08/27/2021 positive for strep oralis. Blood cultures repeated. Check CTA chest was without acute acute pathology. No PE.  Pt has risk of PE with pancreatic cancer   Hypokalemia: Resolved.   Hyponatremia: Resolved.    Prolonged QT interval: Resolved. Avoid medications that can further prolong QT interval   Pancreatic cancer: Biopsied on 08/05/2021. Pancreatic protocol CT demonstrated 3/6x 2.6 cm  pancreatic head mass that was judged to be borderline resectable. Dr. Zenia Resides has evaluated the patient and recommended neoadjuvant therapy. He had one dose of gemcitabine alone on 08/20/2021 and one dose on 08/27/2021 with C1D8. The plan was to add Abraxane on the next cycle if his LFT's improves. Followed by Dr. Burr Medico  of oncology.  BMI 22.89   Consultants: Infectious disease Procedures performed: None  Disposition: Home Diet recommendation:  Discharge Diet Orders (From admission, onward)     Start     Ordered   08/31/21 0000  Diet general        08/31/21 1733           Cardiac diet  DISCHARGE MEDICATION: Allergies as of 08/31/2021   No Known Allergies      Medication List     STOP taking these medications    lidocaine-prilocaine cream Commonly known as: EMLA   ondansetron 4 MG disintegrating tablet Commonly known as: ZOFRAN-ODT   ondansetron 8 MG tablet Commonly known as: Zofran       TAKE these medications    feeding supplement Liqd Take 237 mLs by mouth 3 (three) times daily between meals.   (feeding supplement) PROSource Plus liquid Take 30 mLs by mouth 2 (two) times daily between meals.   apixaban 5 MG Tabs tablet Commonly known as: ELIQUIS Take 1 tablet (5 mg total) by mouth 2 (two) times daily.   bisoprolol-hydrochlorothiazide 2.5-6.25 MG tablet Commonly known as: ZIAC Take 1 tablet by mouth every morning.   escitalopram 20 MG tablet Commonly known as: LEXAPRO Take 20 mg by mouth daily.   famotidine 20 MG tablet Commonly known as: PEPCID Take 1 tablet (20 mg total) by mouth 2 (two) times daily.   mirtazapine 7.5 MG tablet Commonly known as: REMERON Take 1 tablet (7.5 mg total) by mouth at bedtime.   multivitamin with minerals Tabs tablet Take 1 tablet by mouth daily.   oxyCODONE 5 MG immediate release tablet Commonly known as: Oxy IR/ROXICODONE Take 1 tablet (5 mg total) by mouth every 6 (six) hours as needed for moderate pain.   pantoprazole 40 MG tablet Commonly known as: PROTONIX Take 1 tablet (40 mg total) by mouth daily.   prochlorperazine 10 MG tablet Commonly known as: COMPAZINE Take 1 tablet (10 mg total) by mouth every 6 (six) hours as needed (Nausea or vomiting).   sucralfate 1 GM/10ML suspension Commonly known as:  Carafate Take 10 mLs (1 g total) by mouth 2 (two) times daily. What changed: Another medication with the same name was removed. Continue taking this medication, and follow the directions you see here.   Synthroid 112 MCG tablet Generic drug: levothyroxine Take 1 tablet (112 mcg total) by mouth daily before breakfast.   vitamin B-12 1000 MCG tablet Commonly known as: CYANOCOBALAMIN Take 1,000 mcg by mouth 2 (two) times daily.   Vitamin D3 50 MCG (2000 UT) Tabs Take 2,000 Units by mouth daily.       ASK your doctor about these medications    amoxicillin 500 MG tablet Commonly known as: AMOXIL Take 1 tablet (500 mg total) by mouth 3 (three) times daily for 5 days. Ask about: Should I take this medication?         Discharge Exam: Filed Weights   08/28/21 0735 08/28/21 1834  Weight: 67.6 kg 70.3 kg   Vitals:   08/31/21 1408 08/31/21 1422  BP:  113/76  Pulse:  75  Resp:    Temp: 97.7 F (36.5  C)   SpO2:  100%    Exam:  Constitutional:  The patient is awake, alert, and oriented x 3. No acute distress. Respiratory:  No increased work of breathing. No wheezes, rales, or rhonchi No tactile fremitus Cardiovascular:  Regular rate and rhythm No murmurs, ectopy, or gallups. No lateral PMI. No thrills. Abdomen:  Abdomen is soft, non-tender, non-distended No hernias, masses, or organomegaly Normoactive bowel sounds.  Musculoskeletal:  No cyanosis, clubbing, or edema Skin:  No rashes, lesions, ulcers palpation of skin: no induration or nodules Neurologic:  CN 2-12 intact Sensation all 4 extremities intact Psychiatric:  Mental status Mood, affect appropriate Orientation to person, place, time  judgment and insight appear intact  Condition at discharge: fair  The results of significant diagnostics from this hospitalization (including imaging, microbiology, ancillary and laboratory) are listed below for reference.   Imaging Studies: MR ANGIO HEAD WO  CONTRAST  Result Date: 09/28/2021 CLINICAL DATA:  Follow-up treated cerebral aneurysm. EXAM: MRA HEAD WITHOUT CONTRAST TECHNIQUE: Angiographic images of the Circle of Willis were acquired using MRA technique without intravenous contrast. COMPARISON:  MRA head 09/04/2020 FINDINGS: Anterior circulation: Internal carotid artery widely patent bilaterally. No stenosis or aneurysm. Endovascular stent in the right M1 segment causing artifact and decreased luminal diameter. There is good flow in the right MCA branches. 1.5 x 3 mm aneurysm right middle cerebral artery is unchanged. Aneurysm projects inferiorly and posteriorly Anterior cerebral arteries patent bilaterally. Left middle cerebral artery widely patent. No other aneurysm. Posterior circulation: Both vertebral arteries patent to the basilar. PICA patent bilaterally. Basilar widely patent. Superior cerebellar and posterior cerebral arteries are patent bilaterally. No aneurysm in the posterior circulation. Anatomic variants: None Other: None. IMPRESSION: 1. 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 2. No other aneurysm. Electronically Signed   By: Franchot Gallo M.D.   On: 09/28/2021 10:40   IR BILIARY STENT(S) EXISTING ACCESS INC DILATION CATH EXCHANGE  Result Date: 09/20/2021 INDICATION: 81 year old male with pancreatic cancer and malignant obstructed jaundice. A percutaneous internal/external biliary drain was placed on 07/22/2021. Patient presents today for internalization with placement of an self expanding metallic biliary stent. EXAM: Biliary stent placement MEDICATIONS: 2 g Ancef; The antibiotic was administered within an appropriate time frame prior to the initiation of the procedure. ANESTHESIA/SEDATION: Moderate (conscious) sedation was employed during this procedure. A total of Versed 1 mg and Fentanyl 50 mcg was administered intravenously by the radiology nurse. Total intra-service moderate  Sedation Time: 15 minutes. The patient's level of consciousness and vital signs were monitored continuously by radiology nursing throughout the procedure under my direct supervision. FLUOROSCOPY: Radiation Exposure Index (as provided by the fluoroscopic device): 45 mGy Kerma COMPLICATIONS: None immediate. PROCEDURE: Informed written consent was obtained from the patient after a thorough discussion of the procedural risks, benefits and alternatives. All questions were addressed. Maximal Sterile Barrier Technique was utilized including caps, mask, sterile gowns, sterile gloves, sterile drape, hand hygiene and skin antiseptic. A timeout was performed prior to the initiation of the procedure. Local anesthesia was attained by infiltration with 1% lidocaine. The retention suture was cut. The tube was transected and removed over a Bentson wire. A 5 French Amplatz catheter was advanced over the wire and used to manipulate the wire into the first portion of the jejunum. The catheter was removed. An 8 [place] Brite tip sheath was advanced over the wire and into the duodenum. A pull-back through the sheath  cholangiogram was then performed outlining the malignant stricture. A wall flex self expanding covered biliary stent measuring 10 mm in diameter by 6 cm in length was selected. The stent was advanced over the wire and deployed across the malignant stricture. The sheath was reintroduced. Contrast injection demonstrates excellent positioning of the stent with excellent drainage of injected contrast material through the stent. The decision was made to proceed with removal of the sheath and wire. Given performance of the stent, it was felt that there was no need to leave a biliary drain behind. IMPRESSION: Successful placement of 10 x 60 covered WalFlex biliary stent. Electronically Signed   By: Jacqulynn Cadet M.D.   On: 09/20/2021 10:41    Microbiology: Results for orders placed or performed during the hospital encounter  of 08/27/21  Blood Culture (routine x 2)     Status: Abnormal   Collection Time: 08/27/21 10:06 PM   Specimen: BLOOD  Result Value Ref Range Status   Specimen Description   Final    BLOOD LEFT ANTECUBITAL Performed at [facility] [address], [place], [place] [address]    Special Requests   Final    BOTTLES DRAWN AEROBIC AND ANAEROBIC Blood Culture adequate volume Performed at [facility] [address], [place], [place] [address]    Culture  Setup Time   Final    GRAM POSITIVE COCCI IN CHAINS IN BOTH AEROBIC AND ANAEROBIC BOTTLES CRITICAL RESULT CALLED TO, READ BACK BY AND VERIFIED WITH: T,GREEN PHARMD @1632  08/28/21 EB    Culture (A)  Final    STREPTOCOCCUS MITIS/ORALIS STREPTOCOCCUS SALIVARIUS THE SIGNIFICANCE OF ISOLATING THIS ORGANISM FROM A SINGLE SET OF BLOOD CULTURES WHEN MULTIPLE SETS ARE DRAWN IS UNCERTAIN. PLEASE NOTIFY THE MICROBIOLOGY DEPARTMENT WITHIN ONE WEEK IF SPECIATION AND SENSITIVITIES ARE REQUIRED. ROTHIA MUCILAGINOSA CRITICAL RESULT CALLED TO, READ BACK BY AND VERIFIED WITH: DR. Linus Salmons, AT 1130 09/01/21 D. VANHOOK Performed at [facility], [place] [address] [place], [place] [address]    Report Status 09/02/2021 FINAL  Final   Organism ID, Bacteria ROTHIA MUCILAGINOSA  Final      Susceptibility   Rothia mucilaginosa - MIC*    CEFTAZIDIME <=1 SENSITIVE Sensitive     CIPROFLOXACIN <=0.25 SENSITIVE Sensitive     GENTAMICIN <=1 SENSITIVE Sensitive     IMIPENEM <=0.25 SENSITIVE Sensitive     CEFEPIME <=0.12 SENSITIVE Sensitive     * ROTHIA MUCILAGINOSA  Urine Culture     Status: Abnormal   Collection Time: 08/27/21 10:06 PM   Specimen: In/Out Cath Urine  Result Value Ref Range Status   Specimen Description   Final    IN/OUT CATH URINE Performed at [facility], [place] [address], [place], [place] [address]    Special Requests   Final    NONE Performed at [facility], [place] [address], [place], [place] [address]    Culture (A)  Final    1,000 COLONIES/mL ENTEROBACTER CLOACAE 1,000 COLONIES/mL STAPHYLOCOCCUS EPIDERMIDIS    Report Status 08/30/2021 FINAL  Final   Organism ID, Bacteria ENTEROBACTER CLOACAE (A)  Final   Organism ID, Bacteria STAPHYLOCOCCUS EPIDERMIDIS (A)  Final      Susceptibility   Enterobacter cloacae - MIC*    CEFAZOLIN >=64 RESISTANT Resistant     CEFEPIME <=0.12 SENSITIVE Sensitive     CIPROFLOXACIN <=0.25 SENSITIVE Sensitive     GENTAMICIN <=1 SENSITIVE Sensitive     IMIPENEM <=0.25 SENSITIVE Sensitive  NITROFURANTOIN 64 INTERMEDIATE Intermediate     TRIMETH/SULFA <=20 SENSITIVE Sensitive     PIP/TAZO <=4 SENSITIVE Sensitive     * 1,000 COLONIES/mL ENTEROBACTER CLOACAE   Staphylococcus epidermidis - MIC*    CIPROFLOXACIN <=0.5 SENSITIVE Sensitive     GENTAMICIN <=0.5 SENSITIVE Sensitive     NITROFURANTOIN <=16 SENSITIVE Sensitive     OXACILLIN >=4 RESISTANT Resistant     TETRACYCLINE 2 SENSITIVE Sensitive     VANCOMYCIN 2 SENSITIVE Sensitive     TRIMETH/SULFA <=10 SENSITIVE Sensitive     CLINDAMYCIN <=0.25 SENSITIVE Sensitive     RIFAMPIN <=0.5 SENSITIVE Sensitive     Inducible Clindamycin NEGATIVE Sensitive     * 1,000 COLONIES/mL STAPHYLOCOCCUS EPIDERMIDIS  Blood Culture ID Panel (Reflexed)     Status: Abnormal   Collection Time: 08/27/21 10:06 PM  Result Value Ref Range Status   Enterococcus faecalis NOT DETECTED NOT DETECTED Final   Enterococcus Faecium NOT DETECTED NOT DETECTED Final   Listeria monocytogenes NOT DETECTED NOT DETECTED Final   Staphylococcus species NOT DETECTED NOT DETECTED Final   Staphylococcus aureus (BCID) NOT DETECTED NOT DETECTED Final   Staphylococcus epidermidis NOT DETECTED NOT DETECTED Final   Staphylococcus lugdunensis NOT DETECTED NOT DETECTED Final   Streptococcus species DETECTED (A) NOT DETECTED Final    Comment: Not Enterococcus species, Streptococcus agalactiae, Streptococcus  pyogenes, or Streptococcus pneumoniae. CRITICAL RESULT CALLED TO, READ BACK BY AND VERIFIED WITH: T,GREEN PHARMD @1632  08/28/21 EB    Streptococcus agalactiae NOT DETECTED NOT DETECTED Final   Streptococcus pneumoniae NOT DETECTED NOT DETECTED Final   Streptococcus pyogenes NOT DETECTED NOT DETECTED Final   A.calcoaceticus-baumannii NOT DETECTED NOT DETECTED Final   Bacteroides fragilis NOT DETECTED NOT DETECTED Final   Enterobacterales NOT DETECTED NOT DETECTED Final   Enterobacter cloacae complex NOT DETECTED NOT DETECTED Final   Escherichia coli NOT DETECTED NOT DETECTED Final   Klebsiella aerogenes NOT DETECTED NOT DETECTED Final   Klebsiella oxytoca NOT DETECTED NOT DETECTED Final   Klebsiella pneumoniae NOT DETECTED NOT DETECTED Final   Proteus species NOT DETECTED NOT DETECTED Final   Salmonella species NOT DETECTED NOT DETECTED Final   Serratia marcescens NOT DETECTED NOT DETECTED Final   Haemophilus influenzae NOT DETECTED NOT DETECTED Final   Neisseria meningitidis NOT DETECTED NOT DETECTED Final   Pseudomonas aeruginosa NOT DETECTED NOT DETECTED Final   Stenotrophomonas maltophilia NOT DETECTED NOT DETECTED Final   Candida albicans NOT DETECTED NOT DETECTED Final   Candida auris NOT DETECTED NOT DETECTED Final   Candida glabrata NOT DETECTED NOT DETECTED Final   Candida krusei NOT DETECTED NOT DETECTED Final   Candida parapsilosis NOT DETECTED NOT DETECTED Final   Candida tropicalis NOT DETECTED NOT DETECTED Final   Cryptococcus neoformans/gattii NOT DETECTED NOT DETECTED Final    Comment: Performed at [facility], [address], [place], [place] [address]  Resp Panel by RT-PCR (Flu A&B, Covid) Nasopharyngeal Swab     Status: None   Collection Time: 08/27/21 10:07 PM   Specimen: Nasopharyngeal Swab; Nasopharyngeal(NP) swabs in vial transport medium  Result Value Ref Range Status   SARS Coronavirus 2 by RT PCR NEGATIVE NEGATIVE Final    Comment: (NOTE) SARS-CoV-2  target nucleic acids are NOT DETECTED.  The SARS-CoV-2 RNA is generally detectable in upper respiratory specimens during the acute phase of infection. The lowest concentration of SARS-CoV-2 viral copies this assay can detect is 138 copies/mL. A negative result does not preclude SARS-Cov-2 infection and should not be  used as the sole basis for treatment or other patient management decisions. A negative result may occur with  improper specimen collection/handling, submission of specimen other than nasopharyngeal swab, presence of viral mutation(s) within the areas targeted by this assay, and inadequate number of viral copies(<138 copies/mL). A negative result must be combined with clinical observations, patient history, and epidemiological information. The expected result is Negative.  Fact Sheet for Patients:  EntrepreneurPulse.com.au  Fact Sheet for Healthcare Providers:  IncredibleEmployment.be  This test is no t yet approved or cleared by the [place] FDA and  has been authorized for detection and/or diagnosis of SARS-CoV-2 by FDA under an Emergency Use Authorization (EUA). This EUA will remain  in effect (meaning this test can be used) for the duration of the COVID-19 declaration under Section 564(b)(1) of the Act, 21 U.S.C.section 360bbb-3(b)(1), unless the authorization is terminated  or revoked sooner.       Influenza A by PCR NEGATIVE NEGATIVE Final   Influenza B by PCR NEGATIVE NEGATIVE Final    Comment: (NOTE) The Xpert Xpress SARS-CoV-2/FLU/RSV plus assay is intended as an aid in the diagnosis of influenza from Nasopharyngeal swab specimens and should not be used as a sole basis for treatment. Nasal washings and aspirates are unacceptable for Xpert Xpress SARS-CoV-2/FLU/RSV testing.  Fact Sheet for Patients: EntrepreneurPulse.com.au  Fact Sheet for Healthcare  Providers: IncredibleEmployment.be  This test is not yet approved or cleared by the [place] FDA and has been authorized for detection and/or diagnosis of SARS-CoV-2 by FDA under an Emergency Use Authorization (EUA). This EUA will remain in effect (meaning this test can be used) for the duration of the COVID-19 declaration under Section 564(b)(1) of the Act, 21 U.S.C. section 360bbb-3(b)(1), unless the authorization is terminated or revoked.  Performed at [facility], [place] [address] [place], [place] [address]   Blood Culture (routine x 2)     Status: None   Collection Time: 08/27/21 10:11 PM   Specimen: BLOOD  Result Value Ref Range Status   Specimen Description   Final    BLOOD BLOOD LEFT FOREARM Performed at [facility] [address] [place], [place] [address]    Special Requests   Final    BOTTLES DRAWN AEROBIC AND ANAEROBIC Blood Culture adequate volume Performed at [facility] [address], [place], [place] [address]    Culture   Final    NO GROWTH 5 DAYS Performed at [facility], [place] [address], [place], [place] [address]    Report Status 09/02/2021 FINAL  Final  MRSA Next Gen by PCR, Nasal     Status: None   Collection Time: 08/28/21  8:17 PM   Specimen: Nasal Mucosa; Nasal Swab  Result Value Ref Range Status   MRSA by PCR Next Gen NOT DETECTED NOT DETECTED Final    Comment: (NOTE) The GeneXpert MRSA Assay (FDA approved for NASAL specimens only), is one component of a comprehensive MRSA colonization surveillance program. It is not intended to diagnose MRSA infection nor to guide or monitor treatment for MRSA infections. Test performance is not FDA approved in patients less than 65 years old. Performed at [facility], [place] [address], [place], [place] [address]   Culture, blood (routine x 2)     Status: None   Collection Time: 08/29/21  2:39 PM    Specimen: BLOOD  Result Value Ref Range Status   Specimen Description   Final  BLOOD RIGHT ANTECUBITAL Performed at [facility] [address], [place], [place] [address]    Special Requests   Final    BOTTLES DRAWN AEROBIC ONLY Blood Culture adequate volume Performed at [facility] [address], [place], [place] [address]    Culture   Final    NO GROWTH 5 DAYS Performed at [facility], [place] [address], [place], [place] [address]    Report Status 09/03/2021 FINAL  Final  Culture, blood (routine x 2)     Status: None   Collection Time: 08/29/21  2:39 PM   Specimen: BLOOD  Result Value Ref Range Status   Specimen Description   Final    BLOOD RIGHT ANTECUBITAL Performed at [facility] [address] [place], [place] [address]    Special Requests   Final    BOTTLES DRAWN AEROBIC AND ANAEROBIC Blood Culture adequate volume Performed at [facility] [address] [place], [place] [address]    Culture   Final    NO GROWTH 5 DAYS Performed at [facility], [place] [address], [place],  [address]    Report Status 09/03/2021 FINAL  Final    Labs: CBC: No results for input(s): WBC, NEUTROABS, HGB, HCT, MCV, PLT in the last 168 hours. Basic Metabolic Panel: No results for input(s): NA, K, CL, CO2, GLUCOSE, BUN, CREATININE, CALCIUM, MG, PHOS in the last 168 hours. Liver Function Tests: No results for input(s): AST, ALT, ALKPHOS, BILITOT, PROT, ALBUMIN in the last 168 hours. CBG: No results for input(s): GLUCAP in the last 168 hours.  Discharge time spent: greater than 30 minutes.  Signed: Aaleyah Witherow, DO [facility] 10/01/2021

## 2021-10-05 MED FILL — Dexamethasone Sodium Phosphate Inj 100 MG/10ML: INTRAMUSCULAR | Qty: 1 | Status: AC

## 2021-10-06 ENCOUNTER — Inpatient Hospital Stay: Payer: Medicare HMO | Admitting: Hematology

## 2021-10-06 ENCOUNTER — Encounter: Payer: Self-pay | Admitting: Hematology

## 2021-10-06 ENCOUNTER — Inpatient Hospital Stay: Payer: Medicare HMO

## 2021-10-06 ENCOUNTER — Other Ambulatory Visit: Payer: Self-pay

## 2021-10-06 ENCOUNTER — Inpatient Hospital Stay: Payer: Medicare HMO | Admitting: Dietician

## 2021-10-06 VITALS — BP 112/61 | HR 52 | Resp 17

## 2021-10-06 VITALS — BP 88/56 | HR 63 | Temp 98.7°F | Resp 81 | Ht 69.0 in | Wt 142.0 lb

## 2021-10-06 DIAGNOSIS — K219 Gastro-esophageal reflux disease without esophagitis: Secondary | ICD-10-CM | POA: Diagnosis not present

## 2021-10-06 DIAGNOSIS — C250 Malignant neoplasm of head of pancreas: Secondary | ICD-10-CM

## 2021-10-06 DIAGNOSIS — C251 Malignant neoplasm of body of pancreas: Secondary | ICD-10-CM

## 2021-10-06 DIAGNOSIS — R112 Nausea with vomiting, unspecified: Secondary | ICD-10-CM

## 2021-10-06 DIAGNOSIS — E860 Dehydration: Secondary | ICD-10-CM

## 2021-10-06 DIAGNOSIS — Z5111 Encounter for antineoplastic chemotherapy: Secondary | ICD-10-CM | POA: Diagnosis not present

## 2021-10-06 DIAGNOSIS — Z95828 Presence of other vascular implants and grafts: Secondary | ICD-10-CM

## 2021-10-06 LAB — CBC WITH DIFFERENTIAL (CANCER CENTER ONLY)
Abs Immature Granulocytes: 0.04 10*3/uL (ref 0.00–0.07)
Basophils Absolute: 0.1 10*3/uL (ref 0.0–0.1)
Basophils Relative: 1 %
Eosinophils Absolute: 0.2 10*3/uL (ref 0.0–0.5)
Eosinophils Relative: 3 %
HCT: 32.4 % — ABNORMAL LOW (ref 39.0–52.0)
Hemoglobin: 10.7 g/dL — ABNORMAL LOW (ref 13.0–17.0)
Immature Granulocytes: 1 %
Lymphocytes Relative: 17 %
Lymphs Abs: 1.3 10*3/uL (ref 0.7–4.0)
MCH: 27.9 pg (ref 26.0–34.0)
MCHC: 33 g/dL (ref 30.0–36.0)
MCV: 84.4 fL (ref 80.0–100.0)
Monocytes Absolute: 0.8 10*3/uL (ref 0.1–1.0)
Monocytes Relative: 11 %
Neutro Abs: 5 10*3/uL (ref 1.7–7.7)
Neutrophils Relative %: 67 %
Platelet Count: 270 10*3/uL (ref 150–400)
RBC: 3.84 MIL/uL — ABNORMAL LOW (ref 4.22–5.81)
RDW: 17.7 % — ABNORMAL HIGH (ref 11.5–15.5)
WBC Count: 7.4 10*3/uL (ref 4.0–10.5)
nRBC: 0 % (ref 0.0–0.2)

## 2021-10-06 LAB — CMP (CANCER CENTER ONLY)
ALT: 21 U/L (ref 0–44)
AST: 31 U/L (ref 15–41)
Albumin: 3.1 g/dL — ABNORMAL LOW (ref 3.5–5.0)
Alkaline Phosphatase: 93 U/L (ref 38–126)
Anion gap: 5 (ref 5–15)
BUN: 19 mg/dL (ref 8–23)
CO2: 28 mmol/L (ref 22–32)
Calcium: 8.6 mg/dL — ABNORMAL LOW (ref 8.9–10.3)
Chloride: 99 mmol/L (ref 98–111)
Creatinine: 1.25 mg/dL — ABNORMAL HIGH (ref 0.61–1.24)
GFR, Estimated: 58 mL/min — ABNORMAL LOW (ref >60)
Glucose, Bld: 191 mg/dL — ABNORMAL HIGH (ref 70–99)
Potassium: 3.7 mmol/L (ref 3.5–5.1)
Sodium: 132 mmol/L — ABNORMAL LOW (ref 135–145)
Total Bilirubin: 0.7 mg/dL (ref 0.3–1.2)
Total Protein: 6.3 g/dL — ABNORMAL LOW (ref 6.5–8.1)

## 2021-10-06 MED ORDER — DEXAMETHASONE 4 MG PO TABS: 4.0000 mg | ORAL_TABLET | Freq: Every day | ORAL | 0 refills | Status: DC

## 2021-10-06 MED ORDER — PANTOPRAZOLE SODIUM 40 MG PO TBEC
40.0000 mg | DELAYED_RELEASE_TABLET | Freq: Every day | ORAL | 2 refills | Status: DC
Start: 2021-10-06 — End: 2022-03-03

## 2021-10-06 MED ORDER — SODIUM CHLORIDE 0.9% FLUSH
10.0000 mL | Freq: Once | INTRAVENOUS | Status: AC
  Administered 2021-10-06: 10 mL via INTRAVENOUS

## 2021-10-06 MED ORDER — HEPARIN SOD (PORK) LOCK FLUSH 100 UNIT/ML IV SOLN
500.0000 [IU] | Freq: Once | INTRAVENOUS | Status: AC
  Administered 2021-10-06: 500 [IU] via INTRAVENOUS

## 2021-10-06 MED ORDER — SODIUM CHLORIDE 0.9 % IV SOLN: Freq: Once | INTRAVENOUS | Status: AC

## 2021-10-06 MED ORDER — SODIUM CHLORIDE 0.9% FLUSH
10.0000 mL | INTRAVENOUS | Status: DC | PRN
  Administered 2021-10-06: 10 mL

## 2021-10-06 NOTE — Progress Notes (Signed)
Nutrition Follow-up:  Patient receiving neoadjuvant Abraxane/Gemcitabine for pancreatic cancer. Abraxane discontinued due to poor toleration. Patient received last chemotherapy on 2/3.  Met with patient during infusion. He is receiving IV fluids today. Patient eating [place] sandwich and tomato soup at visit. Patient reports he is feeling "much better." His appetite has improved. Patient is eating 3 meals and drinking 2 Ensure Plus daily. He reports food taste good again. Previously, patient reports wanting to vomit after a couple of bites of food. Patient denies nausea, vomiting, diarrhea, constipation.    Medications: Protonix, decadron, zofran, cardizem  Labs: Na 132, Glucose 191, Cr 1.25  Anthropometrics: Weight 142 lb today increased   2/15 - 141 lb 2/6 - 140 lb  1/31 - 140 lb 4.8 oz 1/24 - 146 lb  1/14 - 154 lb 15.7 oz    MALNUTRITION DIAGNOSIS: Severe malnutrition continues    INTERVENTION:  Encouraged high calorie high protein foods to promote weight gain - handout with ideas provided Continue drinking Ensure Plus, recommend 3/day One complimentary case of Ensure Plus High Protein provided    MONITORING, EVALUATION, GOAL: weight trends, intake    NEXT VISIT: To be scheduled with treatment

## 2021-10-06 NOTE — Patient Instructions (Signed)

## 2021-10-06 NOTE — Progress Notes (Signed)
[place]   [phone] [phone]   Clinic Follow up Note   Patient Care Team: Ronnell Freshwater, NP as PCP - General (Family Medicine) Buford Dresser, MD as PCP - Cardiology (Cardiology) Truitt Merle, MD as Consulting Physician (Oncology) Royston Bake, RN as Oncology Nurse Navigator (Oncology)  Date of Service:  10/06/2021  CHIEF COMPLAINT: f/u of pancreatic cancer  CURRENT THERAPY:  Neoadjuvant gemcitabine on day 1, 8 every 21 days, starting 08/20/21  ASSESSMENT & PLAN:  Angel Brayboy. is a 81 y.o. male with   1. 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 EUS and pancreatic mass biopsy on 08/05/21 confirmed adenocarcinoma. -Pancreatic protocol CT scan showed 3.6 x 2.6 cm pancreatic head mass, partially involving multiple vessels, including SMV, portal vein, and hepatic artery.  This is borderline resectable. -Patient was evaluated by Dr. Zenia Resides, who recommends neoadjuvant chemotherapy, which I agree. -he began gemcitabine alone on 08/20/21. He was admitted for fever after cycle 1. He tolerated cycle 2 with gem and abraxane poorly overall with fatigue, low appetite, and a mild intermittent nausea. -I planned to change chemo to single agent gemcitabine every 2 weeks, see if he can tolerate. If not, will stop chemo and proceed with radiation.  If he does not tolerate chemotherapy well, he will unlikely to tolerate Whipple surgery.  -his BP is low today. We will hold chemo again today and proceed with IVF. -I doubt he can tolerate more chemo  -will let Dr. Zenia Resides to evaluate him to see if he is a candidate for surgery    2. Epigastric pain and obstructive jaundice -Status post PTC, which was removed after stent placement by IR yesterday -T bili is trending down     3. Fatigue, anorexia and nausea  -Secondary to chemotherapy,  underlying cancer -Compazine is not very effective, I will call in low-dose Zofran, and a dexamethasone 4 mg daily for 3 to 5 days.  Due to the QT prolongation from mirtazapine and Lexapro, I will be cautious about high-dose Zofran    PLAN -hold gemcitabine, proceed with IV fluids due to his orthostatic hypotension  -he will hold his BP meds and monitor BP at home  -I called in dexa and protonix -will refer him back to Dr. Zenia Resides  -will see him back in 2-3 weeks to decide next step    No problem-specific Assessment & Plan notes found for this encounter.   SUMMARY OF ONCOLOGIC HISTORY: Oncology History Overview Note   Cancer Staging  Pancreatic cancer [facility]) Staging form: Exocrine Pancreas, AJCC 8th Edition - Clinical stage from 08/05/2021: Stage IB (cT2, cN0, cM0) - Signed by Truitt Merle, MD on 08/17/2021 Stage prefix: Initial diagnosis Total positive nodes: 0     Pancreatic mass  07/18/2021 Initial Diagnosis   Pancreatic mass   07/18/2021 Imaging   CT Abdomen Pelvis W Contrast  IMPRESSION: 1. Suspected solid mass within the proximal body of the pancreas measures 2.3 x 2.6 x 2.0 cm. There is downstream dilation of the main pancreatic duct and its branches. There is also intra and extrahepatic biliary ductal dilation. Further evaluation with MRI of the abdomen, pancreatic protocol, when clinically feasible, may be considered. 2. 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 3. Aortic atherosclerosis   07/20/2021 Procedure   DG ERCP  IMPRESSION: Nondiagnostic ERCP as above. Correlation with the operative report is advised.  - The  examination was suspicious for a biliary stricture in the bile duct. - Examination was suspicious for carcinoma of the head of the pancreas. - Attempts at a cholangiogram failed.   07/22/2021 Pathology Results   CASE: [id number]   FINAL MICROSCOPIC DIAGNOSIS:  - Suspicious for malignancy  - See comment   DIAGNOSTIC  COMMENTS:  There are rare cells with cytologic atypia suspicious for  adenocarcinoma.  Dr. Vic Ripper agrees.    07/22/2021 Procedure   IR INT EXT BILIARY DRAIN WITH CHOLANGIOGRAM ( IMPRESSION: 1. 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 2. Successful brush biopsy of obstructing lesion x3. 3. Successful placement of a 10 French internal/external biliary drainage catheter.   PLAN: 1. Follow bilirubin. When significant downward trend is clear, the bag should be capped. Recommend capping bag before discharge home if possible. 2. Follow-up in IR in 4-6 weeks for initial biliary tube check and exchange. If initial brush biopsies are negative, repeat biopsy could be considered at that time.   Malignant neoplasm of body of pancreas ([place])  08/05/2021 Cancer Staging   Staging form: Exocrine Pancreas, AJCC 8th Edition - Clinical stage from 08/05/2021: Stage IB (cT2, cN0, cM0) - Signed by Truitt Merle, MD on 08/17/2021 Stage prefix: Initial diagnosis Total positive nodes: 0    08/14/2021 Initial Diagnosis   Pancreatic cancer ([place])   08/20/2021 -  Chemotherapy   Patient is on Treatment Plan : PANCREATIC Abraxane / Gemcitabine D1,8,15 q28d        INTERVAL HISTORY:  Angel Celeste Sr. is here for a follow up of pancreatic cancer. He was last seen by me on 09/21/21. He presents to the clinic accompanied by daughter. His is recovering from last cycle; he again required IVF.   All other systems were reviewed with the patient and are negative.  MEDICAL HISTORY:  Past Medical History:  Diagnosis Date   Aneurysm ([place])    2017   Anxiety    Arthritis    Depression    Family history of breast cancer    Family history of lung cancer    Headache    HLD (hyperlipidemia)    Hx of inguinal hernia repair    Hypertension    Hypothyroidism    Overactive bladder    Pancreatic cancer (H[facility])    Pneumonia    Shingles    Sleep apnea     Patient denies   Thoracic ascending aortic aneurysm    mildly dilated 4.0cm per 08/12/21 CT   Thyroid disease    Varicose veins of both lower extremities     SURGICAL HISTORY: Past Surgical History:  Procedure Laterality Date   BILATERAL CARPAL TUNNEL RELEASE     CATARACT EXTRACTION, BILATERAL Bilateral    ELBOW SURGERY Bilateral    ENDOSCOPIC RETROGRADE CHOLANGIOPANCREATOGRAPHY (ERCP) WITH PROPOFOL N/A 07/20/2021   Procedure: ENDOSCOPIC RETROGRADE CHOLANGIOPANCREATOGRAPHY (ERCP) WITH PROPOFOL;  Surgeon: Gatha Mayer, MD;  Location: [facility] ENDOSCOPY;  Service: Endoscopy;  Laterality: N/A;   ESOPHAGOGASTRODUODENOSCOPY (EGD) WITH PROPOFOL N/A 08/05/2021   Procedure: ESOPHAGOGASTRODUODENOSCOPY (EGD) WITH PROPOFOL;  Surgeon: Rush Landmark Telford Nab., MD;  Location: [place];  Service: Gastroenterology;  Laterality: N/A;   EUS N/A 08/05/2021   Procedure: UPPER ENDOSCOPIC ULTRASOUND (EUS) RADIAL;  Surgeon: Irving Copas., MD;  Location: [place];  Service: Gastroenterology;  Laterality: N/A;   FINE NEEDLE ASPIRATION  08/05/2021   Procedure: FINE NEEDLE ASPIRATION (FNA) LINEAR;  Surgeon: Irving Copas., MD;  Location: [place];  Service: Gastroenterology;;  INGUINAL HERNIA REPAIR Right 1951   IR ANGIO INTRA EXTRACRAN SEL COM CAROTID INNOMINATE BILAT MOD SED  02/20/2019   IR BILIARY STENT(S) EXISTING ACCESS INC DILATION CATH EXCHANGE  09/20/2021   IR ENDOLUMINAL BX OF BILIARY TREE  07/22/2021   IR GENERIC HISTORICAL  10/28/2016   IR RADIOLOGIST EVAL & MGMT 10/28/2016 [facility]INTER[facility]   IR INT EXT BILIARY DRAIN WITH CHOLANGIOGRAM  07/22/2021   PORTACATH PLACEMENT N/A 08/19/2021   Procedure: INSERTION PORT-A-CATH;  Surgeon: Dwan Bolt, MD;  Location: [facility];  Service: General;  Laterality: N/A;  LMA   RADIOLOGY WITH ANESTHESIA N/A 10/21/2015   Procedure: RADIOLOGY WITH ANESTHESIA;  Surgeon: Luanne Bras, MD;  Location: [place];  Service: Radiology;  Laterality: N/A;    RADIOLOGY WITH ANESTHESIA N/A 02/20/2019   Procedure: [employer];  Surgeon: Luanne Bras, MD;  Location: [place];  Service: Radiology;  Laterality: N/A;   ROTATOR CUFF REPAIR Bilateral    VARICOSE VEIN SURGERY      I have reviewed the social history and family history with the patient and they are unchanged from previous note.  ALLERGIES:  has No Known Allergies.  MEDICATIONS:  Current Outpatient Medications  Medication Sig Dispense Refill   apixaban (ELIQUIS) 5 MG TABS tablet Take 1 tablet (5 mg total) by mouth 2 (two) times daily. 60 tablet 0   bisoprolol-hydrochlorothiazide (ZIAC) 2.5-6.25 MG tablet Take 1 tablet by mouth every morning.     Cholecalciferol (VITAMIN D3) 50 MCG (2000 UT) TABS Take 2,000 Units by mouth daily.     dexamethasone (DECADRON) 4 MG tablet Take 1 tablet (4 mg total) by mouth daily. For 3-5 days after chemo for nausea and fatigue 10 tablet 0   diltiazem (CARDIZEM) 30 MG tablet Take 1 tablet (30 mg total) by mouth every 8 (eight) hours as needed (heart rate fast (more than 100 beats/min at rest)). 90 tablet 0   escitalopram (LEXAPRO) 20 MG tablet Take 20 mg by mouth daily.     famotidine (PEPCID) 20 MG tablet Take 1 tablet (20 mg total) by mouth 2 (two) times daily. 60 tablet 1   feeding supplement (ENSURE ENLIVE / ENSURE PLUS) LIQD Take 237 mLs by mouth 3 (three) times daily between meals. 237 mL 12   mirtazapine (REMERON) 7.5 MG tablet Take 1 tablet (7.5 mg total) by mouth at bedtime. 90 tablet 0   Multiple Vitamin (MULTIVITAMIN WITH MINERALS) TABS tablet Take 1 tablet by mouth daily. 30 tablet 0   mupirocin ointment (BACTROBAN) 2 % Apply to effected area twice daily for next 7 days 22 g 1   Nutritional Supplements (,FEEDING SUPPLEMENT, PROSOURCE PLUS) liquid Take 30 mLs by mouth 2 (two) times daily between meals. 887 mL ML   ondansetron (ZOFRAN) 4 MG tablet Take 1 tablet (4 mg total) by mouth every 8 (eight) hours as needed for nausea or vomiting. 20 tablet 0    oxyCODONE (OXY IR/ROXICODONE) 5 MG immediate release tablet Take 1 tablet (5 mg total) by mouth every 6 (six) hours as needed for moderate pain. 10 tablet 0   pantoprazole (PROTONIX) 40 MG tablet Take 1 tablet (40 mg total) by mouth daily. 30 tablet 2   prochlorperazine (COMPAZINE) 10 MG tablet Take 1 tablet (10 mg total) by mouth every 6 (six) hours as needed (Nausea or vomiting). 30 tablet 1   Sodium Chloride Flush (NORMAL SALINE FLUSH) 0.9 % SOLN Give 10 mLs by tube daily. 300 mL 1   sucralfate (CARAFATE) 1 GM/10ML  suspension Take 10 mLs (1 g total) by mouth 2 (two) times daily. 420 mL 2   SYNTHROID 112 MCG tablet Take 1 tablet (112 mcg total) by mouth daily before breakfast. 90 tablet 1   vitamin B-12 (CYANOCOBALAMIN) 1000 MCG tablet Take 1,000 mcg by mouth 2 (two) times daily.     No current facility-administered medications for this visit.    PHYSICAL EXAMINATION: ECOG PERFORMANCE STATUS: 2 - Symptomatic, <50% confined to bed  Vitals:   10/06/21 1441 10/06/21 1508  BP: 111/62 (!) 88/56  Pulse: 63   Resp: (!) 81   Temp: 98.7 F (37.1 C)   SpO2: 93%    Wt Readings from Last 3 Encounters:  10/06/21 142 lb (64.4 kg)  09/29/21 141 lb (64 kg)  09/20/21 140 lb (63.5 kg)     GENERAL:alert, no distress and comfortable SKIN: skin color normal, no rashes or significant lesions EYES: normal, Conjunctiva are pink and non-injected, sclera clear  NEURO: alert & oriented x 3 with fluent speech  LABORATORY DATA:  I have reviewed the data as listed CBC Latest Ref Rng & Units 10/06/2021 09/24/2021 09/21/2021  WBC 4.0 - 10.5 K/uL 7.4 5.4 6.7  Hemoglobin 13.0 - 17.0 g/dL 10.7(L) 10.7(L) 11.3(L)  Hematocrit 39.0 - 52.0 % 32.4(L) 32.0(L) 33.8(L)  Platelets 150 - 400 K/uL 270 171 188     CMP Latest Ref Rng & Units 10/06/2021 09/24/2021 09/21/2021  Glucose 70 - 99 mg/dL 191(H) 172(H) 200(H)  BUN 8 - 23 mg/dL 19 27(H) 24(H)  Creatinine 0.61 - 1.24 mg/dL 1.25(H) 1.03 1.12  Sodium 135 - 145  mmol/L 132(L) 137 132(L)  Potassium 3.5 - 5.1 mmol/L 3.7 3.8 3.3(L)  Chloride 98 - 111 mmol/L 99 99 94(L)  CO2 22 - 32 mmol/L '28 31 30  ' Calcium 8.9 - 10.3 mg/dL 8.6(L) 8.8(L) 8.8(L)  Total Protein 6.5 - 8.1 g/dL 6.3(L) 6.5 6.7  Total Bilirubin 0.3 - 1.2 mg/dL 0.7 0.9 1.6(H)  Alkaline Phos 38 - 126 U/L 93 157(H) 182(H)  AST 15 - 41 U/L 31 70(H) 85(H)  ALT 0 - 44 U/L 21 65(H) 60(H)      RADIOGRAPHIC STUDIES: I have personally reviewed the radiological images as listed and agreed with the findings in the report. No results found.    No orders of the defined types were placed in this encounter.  All questions were answered. The patient knows to call the clinic with any problems, questions or concerns. No barriers to learning was detected. The total time spent in the appointment was 30 minutes.     Truitt Merle, MD 10/06/2021   I, Wilburn Mylar, am acting as scribe for Truitt Merle, MD.   I have reviewed the above documentation for accuracy and completeness, and I agree with the above.

## 2021-10-07 ENCOUNTER — Ambulatory Visit (INDEPENDENT_AMBULATORY_CARE_PROVIDER_SITE_OTHER): Payer: Medicare HMO | Admitting: Nurse Practitioner

## 2021-10-07 ENCOUNTER — Other Ambulatory Visit: Payer: Self-pay | Admitting: Hematology

## 2021-10-07 ENCOUNTER — Encounter: Payer: Self-pay | Admitting: Nurse Practitioner

## 2021-10-07 VITALS — BP 106/65 | HR 65 | Temp 99.3°F | Ht 69.0 in | Wt 143.8 lb

## 2021-10-07 DIAGNOSIS — F439 Reaction to severe stress, unspecified: Secondary | ICD-10-CM

## 2021-10-07 DIAGNOSIS — I480 Paroxysmal atrial fibrillation: Secondary | ICD-10-CM

## 2021-10-07 DIAGNOSIS — R634 Abnormal weight loss: Secondary | ICD-10-CM | POA: Diagnosis not present

## 2021-10-07 DIAGNOSIS — C250 Malignant neoplasm of head of pancreas: Secondary | ICD-10-CM

## 2021-10-07 DIAGNOSIS — C251 Malignant neoplasm of body of pancreas: Secondary | ICD-10-CM | POA: Diagnosis not present

## 2021-10-07 MED ORDER — APIXABAN 5 MG PO TABS: 5.0000 mg | ORAL_TABLET | Freq: Two times a day (BID) | ORAL | 2 refills | Status: DC

## 2021-10-07 NOTE — Progress Notes (Signed)
Established patient visit   Patient: Angel Keller.   DOB: Apr 28, 1941   81 y.o. Male  MRN: [id number] Visit Date: 10/07/2021   Chief Complaint  Patient presents with   Follow-up   Subjective    HPI  The patient is here for follow up. Had been losing weight with decreased appetite due to chemotherapy for pancreatic cancer. He had lost down to 139 pounds prior to starting this medication. He has gained to 143. His daughter, here with him today, states that patient is eating well now. Is also drinking three Ensures every day. The patient reports that he is sleeping well with this medication.  The patient's daughter states that she is unhappy about the appointment her dad had with oncology yesterday. She states that the oncologist would not administer his chemotherapy yesterday due to the dizziness he was experiencing. He was given IV fluids. Blood pressure medications were held. Oncologist recommend that patient be seen by the surgeon to see If he were candidate to have Whipple procedure to remove the pancreatic mass. Patient's daughter states that oncologist was very negative. Was told that he was not a candidate for further chemotherapy and she doubted if surgery was an option for him.    Medications: Outpatient Medications Prior to Visit  Medication Sig   Cholecalciferol (VITAMIN D3) 50 MCG (2000 UT) TABS Take 2,000 Units by mouth daily.   dexamethasone (DECADRON) 4 MG tablet Take 1 tablet (4 mg total) by mouth daily. For 3-5 days after chemo for nausea and fatigue   diltiazem (CARDIZEM) 30 MG tablet Take 1 tablet (30 mg total) by mouth every 8 (eight) hours as needed (heart rate fast (more than 100 beats/min at rest)).   escitalopram (LEXAPRO) 20 MG tablet Take 20 mg by mouth daily.   famotidine (PEPCID) 20 MG tablet Take 1 tablet (20 mg total) by mouth 2 (two) times daily.   feeding supplement (ENSURE ENLIVE / ENSURE PLUS) LIQD Take 237 mLs by mouth 3 (three) times daily between meals.    mirtazapine (REMERON) 7.5 MG tablet Take 1 tablet (7.5 mg total) by mouth at bedtime.   Multiple Vitamin (MULTIVITAMIN WITH MINERALS) TABS tablet Take 1 tablet by mouth daily.   mupirocin ointment (BACTROBAN) 2 % Apply to effected area twice daily for next 7 days   Nutritional Supplements (,FEEDING SUPPLEMENT, PROSOURCE PLUS) liquid Take 30 mLs by mouth 2 (two) times daily between meals.   ondansetron (ZOFRAN) 4 MG tablet Take 1 tablet (4 mg total) by mouth every 8 (eight) hours as needed for nausea or vomiting.   oxyCODONE (OXY IR/ROXICODONE) 5 MG immediate release tablet Take 1 tablet (5 mg total) by mouth every 6 (six) hours as needed for moderate pain.   pantoprazole (PROTONIX) 40 MG tablet Take 1 tablet (40 mg total) by mouth daily.   prochlorperazine (COMPAZINE) 10 MG tablet Take 1 tablet (10 mg total) by mouth every 6 (six) hours as needed (Nausea or vomiting).   sucralfate (CARAFATE) 1 GM/10ML suspension Take 10 mLs (1 g total) by mouth 2 (two) times daily.   SYNTHROID 112 MCG tablet Take 1 tablet (112 mcg total) by mouth daily before breakfast.   vitamin B-12 (CYANOCOBALAMIN) 1000 MCG tablet Take 1,000 mcg by mouth 2 (two) times daily.   [DISCONTINUED] apixaban (ELIQUIS) 5 MG TABS tablet Take 1 tablet (5 mg total) by mouth 2 (two) times daily.   bisoprolol-hydrochlorothiazide (ZIAC) 2.5-6.25 MG tablet Take 1 tablet by mouth every morning. (Patient not taking:  Reported on 10/07/2021)   [DISCONTINUED] Sodium Chloride Flush (NORMAL SALINE FLUSH) 0.9 % SOLN Give 10 mLs by tube daily.   No facility-administered medications prior to visit.    Review of Systems  Constitutional:  Positive for fatigue. Negative for activity change, chills and fever.       Improved appetite   HENT:  Negative for congestion, postnasal drip, rhinorrhea, sinus pressure, sinus pain, sneezing and sore throat.   Eyes: Negative.   Respiratory:  Negative for cough, shortness of breath and wheezing.   Cardiovascular:   Negative for chest pain and palpitations.  Gastrointestinal:  Positive for nausea. Negative for constipation, diarrhea and vomiting.       Persistent epigastric pain   Endocrine: Negative for cold intolerance, heat intolerance, polydipsia and polyuria.  Genitourinary:  Negative for dysuria, frequency and urgency.  Musculoskeletal:  Negative for back pain and myalgias.  Skin:  Negative for rash.  Allergic/Immunologic: Negative for environmental allergies.  Neurological:  Negative for dizziness, weakness and headaches.  Psychiatric/Behavioral:  Positive for dysphoric mood. The patient is nervous/anxious.    Last CBC Lab Results  Component Value Date   WBC 7.4 10/06/2021   HGB 10.7 (L) 10/06/2021   HCT 32.4 (L) 10/06/2021   MCV 84.4 10/06/2021   MCH 27.9 10/06/2021   RDW 17.7 (H) 10/06/2021   PLT 270 57/84/6962   Last metabolic panel Lab Results  Component Value Date   GLUCOSE 191 (H) 10/06/2021   NA 132 (L) 10/06/2021   K 3.7 10/06/2021   CL 99 10/06/2021   CO2 28 10/06/2021   BUN 19 10/06/2021   CREATININE 1.25 (H) 10/06/2021   GFRNONAA 58 (L) 10/06/2021   CALCIUM 8.6 (L) 10/06/2021   PHOS 3.5 07/09/2009   PROT 6.3 (L) 10/06/2021   ALBUMIN 3.1 (L) 10/06/2021   LABGLOB 2.9 07/27/2021   AGRATIO 1.4 07/27/2021   BILITOT 0.7 10/06/2021   ALKPHOS 93 10/06/2021   AST 31 10/06/2021   ALT 21 10/06/2021   ANIONGAP 5 10/06/2021       Objective     Today's Vitals   10/07/21 1417  BP: 106/65  Pulse: 65  Temp: 99.3 F (37.4 C)  SpO2: 95%  Weight: 143 lb 12.8 oz (65.2 kg)  Height: 5\' 9"  (1.753 m)   Body mass index is 21.24 kg/m.    Wt Readings from Last 3 Encounters:  10/07/21 143 lb 12.8 oz (65.2 kg)  10/06/21 142 lb (64.4 kg)  09/29/21 141 lb (64 kg)    Physical Exam Vitals and nursing note reviewed.  Constitutional:      Appearance: Normal appearance. He is well-developed.  HENT:     Head: Normocephalic and atraumatic.  Eyes:     Pupils: Pupils are  equal, round, and reactive to light.  Cardiovascular:     Rate and Rhythm: Normal rate and regular rhythm.     Pulses: Normal pulses.     Heart sounds: Normal heart sounds.  Pulmonary:     Effort: Pulmonary effort is normal.     Breath sounds: Normal breath sounds.  Abdominal:     Palpations: Abdomen is soft.  Musculoskeletal:        General: Normal range of motion.     Cervical back: Normal range of motion and neck supple.  Lymphadenopathy:     Cervical: No cervical adenopathy.  Skin:    General: Skin is warm and dry.     Capillary Refill: Capillary refill takes less than 2 seconds.  Neurological:     General: No focal deficit present.     Mental Status: He is alert and oriented to person, place, and time.  Psychiatric:        Mood and Affect: Mood normal.        Behavior: Behavior normal.        Thought Content: Thought content normal.        Judgment: Judgment normal.      Assessment & Plan    1. Abnormal weight loss Improving. Patient and his daughter both reporting improved appetite and weight gain. Will continue remeron every evening.   2. Situational stress Improved. Continue remeron daily.   3. Malignant neoplasm of body of pancreas [facility]) Patient requesting second opinion regarding pancreatic cancer and treatment. An urgent referral was placed to [facility] for further evaluation. I did recommend that patient keep all current appointments with oncology and surgery. He and family members voiced understanding.  - Ambulatory referral to Hematology / Oncology  4. Paroxysmal atrial fibrillation ([place]) Recommended he continue eliquis as previously prescribed per cardiology. New rx sent to local pharmacy. He should contact cardiology to seek further advice about continuation of this medication. Patinet and family members voiced understanding and agreement . - apixaban (ELIQUIS) 5 MG TABS tablet; Take 1 tablet (5 mg total) by mouth 2 (two) times daily.  Dispense: 60  tablet; Refill: 2    Problem List Items Addressed This Visit       Cardiovascular and Mediastinum   Paroxysmal atrial fibrillation ([facility])   Relevant Medications   apixaban (ELIQUIS) 5 MG TABS tablet     Digestive   Malignant neoplasm of body of pancreas [facility])   Relevant Orders   Ambulatory referral to Hematology / Oncology     Other   Abnormal weight loss - Primary   Situational stress     Return in about 4 months (around 02/04/2022) for mood, med refills.         Ronnell Freshwater, NP  [facility] at [facility] [phone] (phone) [phone] (fax)  [place]

## 2021-10-11 ENCOUNTER — Telehealth: Payer: Self-pay | Admitting: Hematology

## 2021-10-11 NOTE — Telephone Encounter (Signed)
Left message with follow-up appointment per 2/22 los. °

## 2021-10-19 MED FILL — Dexamethasone Sodium Phosphate Inj 100 MG/10ML: INTRAMUSCULAR | Qty: 1 | Status: AC

## 2021-10-20 ENCOUNTER — Ambulatory Visit (HOSPITAL_COMMUNITY)
Admission: RE | Admit: 2021-10-20 | Discharge: 2021-10-20 | Disposition: A | Discharge status: Home / Self Care | Payer: Medicare HMO | Source: Ambulatory Visit | Attending: Hematology | Admitting: Hematology

## 2021-10-20 ENCOUNTER — Encounter (HOSPITAL_COMMUNITY): Payer: Self-pay

## 2021-10-20 ENCOUNTER — Inpatient Hospital Stay: Payer: Medicare HMO | Attending: Physician Assistant

## 2021-10-20 ENCOUNTER — Other Ambulatory Visit: Payer: Self-pay

## 2021-10-20 DIAGNOSIS — C250 Malignant neoplasm of head of pancreas: Secondary | ICD-10-CM | POA: Insufficient documentation

## 2021-10-20 DIAGNOSIS — I951 Orthostatic hypotension: Secondary | ICD-10-CM | POA: Insufficient documentation

## 2021-10-20 DIAGNOSIS — Z7901 Long term (current) use of anticoagulants: Secondary | ICD-10-CM | POA: Insufficient documentation

## 2021-10-20 DIAGNOSIS — Z95828 Presence of other vascular implants and grafts: Secondary | ICD-10-CM

## 2021-10-20 LAB — CBC WITH DIFFERENTIAL (CANCER CENTER ONLY)
Abs Immature Granulocytes: 0.02 10*3/uL (ref 0.00–0.07)
Basophils Absolute: 0.1 10*3/uL (ref 0.0–0.1)
Basophils Relative: 1 %
Eosinophils Absolute: 0.5 10*3/uL (ref 0.0–0.5)
Eosinophils Relative: 7 %
HCT: 36.2 % — ABNORMAL LOW (ref 39.0–52.0)
Hemoglobin: 11.8 g/dL — ABNORMAL LOW (ref 13.0–17.0)
Immature Granulocytes: 0 %
Lymphocytes Relative: 36 %
Lymphs Abs: 2.4 10*3/uL (ref 0.7–4.0)
MCH: 27.8 pg (ref 26.0–34.0)
MCHC: 32.6 g/dL (ref 30.0–36.0)
MCV: 85.4 fL (ref 80.0–100.0)
Monocytes Absolute: 0.7 10*3/uL (ref 0.1–1.0)
Monocytes Relative: 10 %
Neutro Abs: 3.2 10*3/uL (ref 1.7–7.7)
Neutrophils Relative %: 46 %
Platelet Count: 256 10*3/uL (ref 150–400)
RBC: 4.24 MIL/uL (ref 4.22–5.81)
RDW: 17.9 % — ABNORMAL HIGH (ref 11.5–15.5)
WBC Count: 6.9 10*3/uL (ref 4.0–10.5)
nRBC: 0 % (ref 0.0–0.2)

## 2021-10-20 LAB — CMP (CANCER CENTER ONLY)
ALT: 17 U/L (ref 0–44)
AST: 25 U/L (ref 15–41)
Albumin: 3.6 g/dL (ref 3.5–5.0)
Alkaline Phosphatase: 81 U/L (ref 38–126)
Anion gap: 6 (ref 5–15)
BUN: 20 mg/dL (ref 8–23)
CO2: 30 mmol/L (ref 22–32)
Calcium: 9.5 mg/dL (ref 8.9–10.3)
Chloride: 103 mmol/L (ref 98–111)
Creatinine: 0.98 mg/dL (ref 0.61–1.24)
GFR, Estimated: >60 mL/min (ref >60)
Glucose, Bld: 98 mg/dL (ref 70–99)
Potassium: 4.3 mmol/L (ref 3.5–5.1)
Sodium: 139 mmol/L (ref 135–145)
Total Bilirubin: 0.8 mg/dL (ref 0.3–1.2)
Total Protein: 7 g/dL (ref 6.5–8.1)

## 2021-10-20 MED ORDER — SODIUM CHLORIDE 0.9% FLUSH
10.0000 mL | INTRAVENOUS | Status: DC | PRN
  Administered 2021-10-20: 10 mL

## 2021-10-20 MED ORDER — IOHEXOL 300 MG/ML  SOLN
100.0000 mL | Freq: Once | INTRAMUSCULAR | Status: AC | PRN
  Administered 2021-10-20: 100 mL via INTRAVENOUS

## 2021-10-20 MED ORDER — SODIUM CHLORIDE (PF) 0.9 % IJ SOLN
INTRAMUSCULAR | Status: AC
  Filled 2021-10-20: qty 50

## 2021-10-20 MED ORDER — HEPARIN SOD (PORK) LOCK FLUSH 100 UNIT/ML IV SOLN
500.0000 [IU] | Freq: Once | INTRAVENOUS | Status: AC
  Administered 2021-10-20: 500 [IU] via INTRAVENOUS

## 2021-10-20 MED ORDER — HEPARIN SOD (PORK) LOCK FLUSH 100 UNIT/ML IV SOLN
INTRAVENOUS | Status: AC
  Filled 2021-10-20: qty 5

## 2021-10-21 LAB — CANCER ANTIGEN 19-9: CA 19-9: 36 U/mL — ABNORMAL HIGH (ref 0–35)

## 2021-10-22 MED FILL — Dexamethasone Sodium Phosphate Inj 100 MG/10ML: INTRAMUSCULAR | Qty: 1 | Status: AC

## 2021-10-25 ENCOUNTER — Other Ambulatory Visit: Payer: Medicare HMO

## 2021-10-25 ENCOUNTER — Other Ambulatory Visit: Payer: Self-pay | Admitting: Hematology

## 2021-10-25 ENCOUNTER — Other Ambulatory Visit: Payer: Self-pay

## 2021-10-25 ENCOUNTER — Inpatient Hospital Stay: Payer: Medicare HMO | Admitting: Hematology

## 2021-10-25 ENCOUNTER — Encounter: Payer: Self-pay | Admitting: Hematology

## 2021-10-25 VITALS — BP 133/86 | HR 82 | Temp 98.3°F | Resp 18 | Ht 69.0 in | Wt 144.2 lb

## 2021-10-25 DIAGNOSIS — C250 Malignant neoplasm of head of pancreas: Secondary | ICD-10-CM | POA: Diagnosis present

## 2021-10-25 DIAGNOSIS — C251 Malignant neoplasm of body of pancreas: Secondary | ICD-10-CM | POA: Diagnosis not present

## 2021-10-25 DIAGNOSIS — Z7901 Long term (current) use of anticoagulants: Secondary | ICD-10-CM | POA: Diagnosis not present

## 2021-10-25 DIAGNOSIS — I951 Orthostatic hypotension: Secondary | ICD-10-CM | POA: Diagnosis not present

## 2021-10-25 NOTE — Progress Notes (Signed)
[place]   [phone] Fax:(336) [phone]   Clinic Follow up Note   Patient Care Team: Ronnell Freshwater, NP as PCP - General (Family Medicine) Buford Dresser, MD as PCP - Cardiology (Cardiology) Truitt Merle, MD as Consulting Physician (Oncology) Royston Bake, RN as Oncology Nurse Navigator (Oncology)  Date of Service:  10/25/2021  CHIEF COMPLAINT: f/u of pancreatic cancer  CURRENT THERAPY:  Chemo on hold  ASSESSMENT & PLAN:  Angel Keller. is a 81 y.o. male with   1. 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 EUS and pancreatic mass biopsy on 08/05/21 confirmed adenocarcinoma. -Pancreatic protocol CT scan showed 3.6 x 2.6 cm pancreatic head mass, partially involving multiple vessels, including SMV, portal vein, and hepatic artery.  This is borderline resectable. -Patient was evaluated by Dr. Zenia Resides, who recommends neoadjuvant chemotherapy, which I agree. -he began gemcitabine alone on 08/20/21. He was admitted for fever after cycle 1. He tolerated cycle 2 with gem and abraxane poorly overall with fatigue, low appetite, and a mild intermittent nausea. -due to low BP, we have held chemo after cycle 2. -restaging CT AP on 10/20/21 showed partial response to therapy.  -he is scheduled to meet Dr. Zenia Resides on 10/29/21 to discuss surgery. -If more neoadjuvant chemo is preferred, I plan to change chemo to single agent gemcitabine every 2 weeks. If he is not a candidate for surgery, I would recommend consolidation radiation after 3 to 6 months low intensity chemo.  2. Orthostatic Hypotension -secondary to chemo, likely abraxane. -chemo held after one dose of abraxane -he is holding his BP meds and is monitoring his BP at home -He still has symptomatic orthostatic hypotension.  I will reach out to his cardiologist Dr. Harrell Gave   3.  Epigastric pain and obstructive jaundice -Status post PTC, which was removed after stent placement by IR yesterday -T bili is trending down    4. Fatigue, anorexia and nausea  -Secondary to chemotherapy, underlying cancer -Compazine is not very effective, I will call in low-dose Zofran, and a dexamethasone 4 mg daily for 3 to 5 days.  Due to the QT prolongation from mirtazapine and Lexapro, I will be cautious about high-dose Zofran     PLAN -Restaging CT scan reviewed, partial response  -f/u with Dr. Zenia Resides on 10/29/21 to discuss surgery -lab, flush, and gemcitabine in 2 weeks if more neoadjuvant chemo is preferred by Dr. Zenia Resides  -f/u in 2 weeks -I will reach out to his cardiologist Dr. Harrell Gave, to see if they are okay to hold Cardizem, and add midodrine   No problem-specific Assessment & Plan notes found for this encounter.   SUMMARY OF ONCOLOGIC HISTORY: Oncology History Overview Note   Cancer Staging  Pancreatic cancer [facility]) Staging form: Exocrine Pancreas, AJCC 8th Edition - Clinical stage from 08/05/2021: Stage IB (cT2, cN0, cM0) - Signed by Truitt Merle, MD on 08/17/2021 Stage prefix: Initial diagnosis Total positive nodes: 0     Pancreatic mass  07/18/2021 Initial Diagnosis   Pancreatic mass   07/18/2021 Imaging   CT Abdomen Pelvis W Contrast  IMPRESSION: 1. Suspected solid mass within the proximal body of the pancreas measures 2.3 x 2.6 x 2.0 cm. There is downstream dilation of the main pancreatic duct and its branches. There is also intra and extrahepatic biliary ductal dilation. Further evaluation with MRI of the abdomen, pancreatic protocol, when clinically feasible, may be considered.  2. 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 3. Aortic atherosclerosis   07/20/2021 Procedure   DG ERCP  IMPRESSION: Nondiagnostic ERCP as above. Correlation with the operative report is advised.  - The examination was suspicious for a biliary stricture in  the bile duct. - Examination was suspicious for carcinoma of the head of the pancreas. - Attempts at a cholangiogram failed.   07/22/2021 Pathology Results   CASE: [id number]   FINAL MICROSCOPIC DIAGNOSIS:  - Suspicious for malignancy  - See comment   DIAGNOSTIC COMMENTS:  There are rare cells with cytologic atypia suspicious for  adenocarcinoma.  Dr. Vic Ripper agrees.    07/22/2021 Procedure   IR INT EXT BILIARY DRAIN WITH CHOLANGIOGRAM ( IMPRESSION: 1. 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 2. Successful brush biopsy of obstructing lesion x3. 3. Successful placement of a 10 French internal/external biliary drainage catheter.   PLAN: 1. Follow bilirubin. When significant downward trend is clear, the bag should be capped. Recommend capping bag before discharge home if possible. 2. Follow-up in IR in 4-6 weeks for initial biliary tube check and exchange. If initial brush biopsies are negative, repeat biopsy could be considered at that time.   Malignant neoplasm of body of pancreas ([place])  08/05/2021 Cancer Staging   Staging form: Exocrine Pancreas, AJCC 8th Edition - Clinical stage from 08/05/2021: Stage IB (cT2, cN0, cM0) - Signed by Truitt Merle, MD on 08/17/2021 Stage prefix: Initial diagnosis Total positive nodes: 0    08/14/2021 Initial Diagnosis   Pancreatic cancer ([place])   08/20/2021 -  Chemotherapy   Patient is on Treatment Plan : PANCREATIC Abraxane / Gemcitabine D1,8,15 q28d        INTERVAL HISTORY:  Angel Celeste Sr. is here for a follow up of pancreatic cancer. He was last seen by me on 10/06/21. He presents to the clinic accompanied by his daughter. He reports he is feeling well today. They have been monitoring his BP at home. He reports he is supplementing with 3 Ensure a day.   All other systems were reviewed with the patient and are negative.  MEDICAL HISTORY:  Past Medical History:   Diagnosis Date   Aneurysm ([place])    2017   Anxiety    Arthritis    Depression    Family history of breast cancer    Family history of lung cancer    Headache    HLD (hyperlipidemia)    Hx of inguinal hernia repair    Hypertension    Hypothyroidism    Overactive bladder    Pancreatic cancer (HCC)    Pneumonia    Shingles    Sleep apnea    Patient denies   Thoracic ascending aortic aneurysm    mildly dilated 4.0cm per 08/12/21 CT   Thyroid disease    Varicose veins of both lower extremities     SURGICAL HISTORY: Past Surgical History:  Procedure Laterality Date   BILATERAL CARPAL TUNNEL RELEASE     CATARACT EXTRACTION, BILATERAL Bilateral    ELBOW SURGERY Bilateral    ENDOSCOPIC RETROGRADE CHOLANGIOPANCREATOGRAPHY (ERCP) WITH PROPOFOL N/A 07/20/2021   Procedure: ENDOSCOPIC RETROGRADE CHOLANGIOPANCREATOGRAPHY (ERCP) WITH PROPOFOL;  Surgeon: Gatha Mayer, MD;  Location[facility] ENDOSCOPY;  Service: Endoscopy;  Laterality: N/A;   ESOPHAGOGASTRODUODENOSCOPY (EGD) WITH PROPOFOL N/A 08/05/2021   Procedure: ESOPHAGOGASTRODUODENOSCOPY (EGD) WITH PROPOFOL;  Surgeon: Rush Landmark Telford Nab., MD;  Location: [place];  Service: Gastroenterology;  Laterality: N/A;   EUS N/A 08/05/2021  Procedure: UPPER ENDOSCOPIC ULTRASOUND (EUS) RADIAL;  Surgeon: Rush Landmark Telford Nab., MD;  Location: [place];  Service: Gastroenterology;  Laterality: N/A;   FINE NEEDLE ASPIRATION  08/05/2021   Procedure: FINE NEEDLE ASPIRATION (FNA) LINEAR;  Surgeon: Irving Copas., MD;  Location: [place];  Service: Gastroenterology;;   INGUINAL HERNIA REPAIR Right 1951   IR ANGIO INTRA EXTRACRAN SEL COM CAROTID INNOMINATE BILAT MOD SED  02/20/2019   IR BILIARY STENT(S) EXISTING ACCESS INC DILATION CATH EXCHANGE  09/20/2021   IR ENDOLUMINAL BX OF BILIARY TREE  07/22/2021   IR GENERIC HISTORICAL  10/28/2016   IR RADIOLOGIST EVAL & MGMT 10/28/2016 [facility]-INTERV RAD   IR INT EXT BILIARY DRAIN WITH  CHOLANGIOGRAM  07/22/2021   PORTACATH PLACEMENT N/A 08/19/2021   Procedure: INSERTION PORT-A-CATH;  Surgeon: Dwan Bolt, MD;  Location: [facility] ORS;  Service: General;  Laterality: N/A;  LMA   RADIOLOGY WITH ANESTHESIA N/A 10/21/2015   Procedure: RADIOLOGY WITH ANESTHESIA;  Surgeon: Luanne Bras, MD;  Location: [place];  Service: Radiology;  Laterality: N/A;   RADIOLOGY WITH ANESTHESIA N/A 02/20/2019   Procedure: [employer];  Surgeon: Luanne Bras, MD;  Location: [place];  Service: Radiology;  Laterality: N/A;   ROTATOR CUFF REPAIR Bilateral    VARICOSE VEIN SURGERY      I have reviewed the social history and family history with the patient and they are unchanged from previous note.  ALLERGIES:  has No Known Allergies.  MEDICATIONS:  Current Outpatient Medications  Medication Sig Dispense Refill   apixaban (ELIQUIS) 5 MG TABS tablet Take 1 tablet (5 mg total) by mouth 2 (two) times daily. 60 tablet 2   Cholecalciferol (VITAMIN D3) 50 MCG (2000 UT) TABS Take 2,000 Units by mouth daily.     dexamethasone (DECADRON) 4 MG tablet Take 1 tablet (4 mg total) by mouth daily. For 3-5 days after chemo for nausea and fatigue 10 tablet 0   diltiazem (CARDIZEM) 30 MG tablet Take 1 tablet (30 mg total) by mouth every 8 (eight) hours as needed (heart rate fast (more than 100 beats/min at rest)). 90 tablet 0   escitalopram (LEXAPRO) 20 MG tablet Take 20 mg by mouth daily.     famotidine (PEPCID) 20 MG tablet Take 1 tablet (20 mg total) by mouth 2 (two) times daily. 60 tablet 1   feeding supplement (ENSURE ENLIVE / ENSURE PLUS) LIQD Take 237 mLs by mouth 3 (three) times daily between meals. 237 mL 12   mirtazapine (REMERON) 7.5 MG tablet Take 1 tablet (7.5 mg total) by mouth at bedtime. 90 tablet 0   Multiple Vitamin (MULTIVITAMIN WITH MINERALS) TABS tablet Take 1 tablet by mouth daily. 30 tablet 0   mupirocin ointment (BACTROBAN) 2 % Apply to effected area twice daily for next 7 days 22 g 1    Nutritional Supplements (,FEEDING SUPPLEMENT, PROSOURCE PLUS) liquid Take 30 mLs by mouth 2 (two) times daily between meals. 887 mL ML   ondansetron (ZOFRAN) 4 MG tablet Take 1 tablet (4 mg total) by mouth every 8 (eight) hours as needed for nausea or vomiting. 20 tablet 0   oxyCODONE (OXY IR/ROXICODONE) 5 MG immediate release tablet Take 1 tablet (5 mg total) by mouth every 6 (six) hours as needed for moderate pain. 10 tablet 0   pantoprazole (PROTONIX) 40 MG tablet Take 1 tablet (40 mg total) by mouth daily. 30 tablet 2   prochlorperazine (COMPAZINE) 10 MG tablet Take 1 tablet (10 mg total) by mouth every 6 (  six) hours as needed (Nausea or vomiting). 30 tablet 1   sucralfate (CARAFATE) 1 GM/10ML suspension Take 10 mLs (1 g total) by mouth 2 (two) times daily. 420 mL 2   SYNTHROID 112 MCG tablet Take 1 tablet (112 mcg total) by mouth daily before breakfast. 90 tablet 1   vitamin B-12 (CYANOCOBALAMIN) 1000 MCG tablet Take 1,000 mcg by mouth 2 (two) times daily.     No current facility-administered medications for this visit.    PHYSICAL EXAMINATION: ECOG PERFORMANCE STATUS: 2 - Symptomatic, <50% confined to bed  Vitals:   10/25/21 1210  BP: 133/86  Pulse: 82  Resp: 18  Temp: 98.3 F (36.8 C)  SpO2: 97%   Wt Readings from Last 3 Encounters:  10/25/21 144 lb 3.2 oz (65.4 kg)  10/07/21 143 lb 12.8 oz (65.2 kg)  10/06/21 142 lb (64.4 kg)     GENERAL:alert, no distress and comfortable SKIN: skin color normal, no rashes or significant lesions EYES: normal, Conjunctiva are pink and non-injected, sclera clear  NEURO: alert & oriented x 3 with fluent speech  LABORATORY DATA:  I have reviewed the data as listed CBC Latest Ref Rng & Units 10/20/2021 10/06/2021 09/24/2021  WBC 4.0 - 10.5 K/uL 6.9 7.4 5.4  Hemoglobin 13.0 - 17.0 g/dL 11.8(L) 10.7(L) 10.7(L)  Hematocrit 39.0 - 52.0 % 36.2(L) 32.4(L) 32.0(L)  Platelets 150 - 400 K/uL 256 270 171     CMP Latest Ref Rng & Units 10/20/2021  10/06/2021 09/24/2021  Glucose 70 - 99 mg/dL 98 191(H) 172(H)  BUN 8 - 23 mg/dL 20 19 27(H)  Creatinine 0.61 - 1.24 mg/dL 0.98 1.25(H) 1.03  Sodium 135 - 145 mmol/L 139 132(L) 137  Potassium 3.5 - 5.1 mmol/L 4.3 3.7 3.8  Chloride 98 - 111 mmol/L 103 99 99  CO2 22 - 32 mmol/L _0 Calcium 8.9 - 10.3 mg/dL 9.5 8.6(L) 8.8(L)  Total Protein 6.5 - 8.1 g/dL 7.0 6.3(L) 6.5  Total Bilirubin 0.3 - 1.2 mg/dL 0.8 0.7 0.9  Alkaline Phos 38 - 126 U/L 81 93 157(H)  AST 15 - 41 U/L 25 31 70(H)  ALT 0 - 44 U/L 17 21 65(H)      RADIOGRAPHIC STUDIES: I have personally reviewed the radiological images as listed and agreed with the findings in the report. No results found.    No orders of the defined types were placed in this encounter.  All questions were answered. The patient knows to call the clinic with any problems, questions or concerns. No barriers to learning was detected. The total time spent in the appointment was 30 minutes.     Truitt Merle, MD 10/25/2021   I, Wilburn Mylar, am acting as scribe for Truitt Merle, MD.   I have reviewed the above documentation for accuracy and completeness, and I agree with the above.

## 2021-10-29 ENCOUNTER — Telehealth (HOSPITAL_BASED_OUTPATIENT_CLINIC_OR_DEPARTMENT_OTHER): Payer: Self-pay | Admitting: *Deleted

## 2021-10-29 ENCOUNTER — Ambulatory Visit: Payer: Self-pay | Admitting: Surgery

## 2021-10-29 DIAGNOSIS — C250 Malignant neoplasm of head of pancreas: Secondary | ICD-10-CM

## 2021-10-29 NOTE — H&P (Signed)
History of Present Illness: ?Angel Keller is a 81 y.o. male who is seen today for follow up of pancreatic cancer. He was treated with neoadjuvant gem/abraxane, but tolerated poorly due to fatigue and nausea. Treatment was stopped in early February after 2 cycles due to intolerance. He had percutaneous placement of an internal SEMS in the common bile duct on 2/6 and his internal/external biliary drain was removed. He had a repeat staging scan on 10/20/21, which showed decrease in size of the pancreatic mass. ?  ?Today he says he has been improving since stopping chemotherapy. He has had some dizziness and orthostasis, and BP meds have been adjusted. His appetite has significantly improved and he is eating well and consuming 3 Ensures per day. He also exercises at home 3 times weekly. He is ambulating without difficulty. He denies abdominal pain. He is on Eliquis for a-fib, which he had while hospitalized for fevers during his first cycle of chemotherapy. He had labs last week, and LFTs and WBC were normal. His CA19-9 decreased during treatment (302>>62). ?  ?  ?Review of Systems: ?A complete review of systems was obtained from the patient.  I have reviewed this information and discussed as appropriate with the patient.  See HPI as well for other ROS. ?  ?  ?  ?Medical History: ?Past Medical History ?Past Medical History: ?Diagnosis Date ? Aneurysm (CMS-HCC)   ? Anxiety   ? Arthritis   ? GERD (gastroesophageal reflux disease)   ? History of cancer   ? Hyperlipidemia   ? Hypertension   ? Thyroid disease   ? ?  ?  ?Patient Active Problem List ?Diagnosis ? 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 Weight loss, unintentional ? Neoplastic malignant related fatigue ?  ?  ?Past Surgical History ?Past Surgical History: ?Procedure Laterality Date ? [place] ? ?  ?  ?Allergies ?No Known Allergies ? ?  ?Current Outpatient Medications on File  Prior to Visit ?Medication Sig Dispense Refill ? aspirin 81 MG EC tablet Take by mouth     ? bisoprolol (ZEBETA) 5 MG tablet       ? cholecalciferol (VITAMIN D3) 2,000 unit tablet Take by mouth     ? cyanocobalamin (VITAMIN B12) 1000 MCG tablet Take by mouth     ? escitalopram oxalate (LEXAPRO) 20 MG tablet       ? famotidine (PEPCID) 20 MG tablet Take 20 mg by mouth 2 (two) times daily     ? levothyroxine (SYNTHROID) 112 MCG tablet Take by mouth     ? sodium chloride 0.9 %, flush, sycp 10 mLs by Intracatheter route once daily 300 mL 5 ?  ?No current facility-administered medications on file prior to visit. ?  ?  ?Family History ?Family History ?Problem Relation Age of Onset ? Breast cancer Mother   ? ?  ?  ?Social History ?  ?Tobacco Use ?Smoking Status Former ? Types: Cigarettes ?Smokeless Tobacco Never ?  ?  ?Social History ?Social History ?  ? ?Socioeconomic History ? Marital status: Widowed ?Tobacco Use ? Smoking status: Former ?    Types: Cigarettes ? Smokeless tobacco: Never ?Substance and Sexual Activity ? Alcohol use: Never ? Drug use: Never ? ?  ?  ?Objective: ?  ?  ?Vitals: ?  10/29/21 1427 ?BP: 136/84 ?Pulse: 84 ?Temp: 36.6 ?C (97.8 ?F) ?SpO2: 98% ?Weight: 66.6 kg (146 lb 12.8 oz) ?  ?Body mass  index is 22.65 kg/m?. ?  ?Physical Exam ?Vitals reviewed.  ?Constitutional:   ?   General: He is not in acute distress. ?   Appearance: Normal appearance.  ?HENT:  ?   Head: Normocephalic and atraumatic.  ?Eyes:  ?   General: No scleral icterus. ?   Conjunctiva/sclera: Conjunctivae normal.  ?Cardiovascular:  ?   Rate and Rhythm: Normal rate and regular rhythm.  ?   Heart sounds: No murmur heard. ?Pulmonary:  ?   Effort: Pulmonary effort is normal. No respiratory distress.  ?   Breath sounds: Normal breath sounds.  ?Abdominal:  ?   General: There is no distension.  ?   Palpations: Abdomen is soft.  ?   Tenderness: There is no abdominal tenderness.  ?Musculoskeletal:     ?   General: No swelling or deformity. Normal  range of motion.  ?   Cervical back: Normal range of motion.  ?Skin: ?   General: Skin is warm and dry.  ?   Coloration: Skin is not jaundiced.  ?Neurological:  ?   General: No focal deficit present.  ?   Mental Status: He is alert and oriented to person, place, and time.  ?Psychiatric:     ?   Mood and Affect: Mood normal.     ?   Behavior: Behavior normal.     ?   Thought Content: Thought content normal.  ?  ?  ?  ?  ?Labs, Imaging and Diagnostic Testing: ?CT abd/pelvis 10/20/21: ?IMPRESSION: ?  ?1. Response to therapy of pancreatic head/neck primary. ?  ?2. 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 The previously ?  ?described hepatic artery abutment is no longer identified. ?  ?3. Biliary stent in place with pneumobilia. Interval removal of ?  ?percutaneous biliary drain. ?  ?4. No findings of metastatic disease. ?  ?5. Slight improvement in mild pancreatic duct dilatation. ?  ?6. Aortic atherosclerosis (ICD10-I70.0), coronary artery ?  ?atherosclerosis and emphysema (ICD10-J43.9). ?  ?7. Prostatomegaly with probable secondary bladder wall thickening. ?  ?  ?Assessment and Plan: ?   ?Diagnoses and all orders for this visit: ?  ?Pancreatic adenocarcinoma (CMS-HCC) ?  ?  ?This is an 81 yo male with pancreatic adenocarcinoma. I personally reviewed his pre- and post-treatment CT scans. His initial scan showed clear venous involvement at the PV/SMV confluence, which has improved. There was also concern for hepatic artery abutment on the first scan, which appears to have resolved. There remains mild venous distortion on the latest CT where the tumor abuts the proximal SMV, but the vein is patient with no collateralization, and the SMA and hepatic artery are free of disease. There is conventional hepatic arterial anatomy. This appears to be resectable, but may require resection of a short segment of SMV. ?  ?I had an extensive discussion with the patient regarding surgery, which would entail a staging laparoscopy,  and open Whipple with possible venous resection. I reviewed in detail the risks, including bleeding, infection, 15% risk of pancreatic fistula, 10-15% risk of delayed gastric emptying, and overall 30% risk of major complication including the above complications but also, blood clot, pneumonia, renal failure, respiratory failure, and 1-3% risk of death. I discussed that at his age he has less functional reserve to tolerate a major complication, and a vascular resection adds risk to an already high-risk operation. I also discussed he may need to go to a rehab or nursing facility at discharge. We reviewed that surgery is the only  potentially curative treatment for pancreatic cancer but carries the highest risk of short-term morbidity and mortality, and that a majority of patients with pancreatic cancer ultimately have disease recurrence. However, he is very motivated and he seems to have significantly improved in the last few weeks, has good nutrition, and is functionally independent and still able to exercise 3 times weekly. Prior to his diagnosis, he went to the gym 3 times weekly. Aside from his a-fib he does not have any major medical comorbidities. After an extensive and detailed discussion of the risks of surgery, he and his daughter both expressed understanding, and the patient very much wants to have surgery. I will schedule him for surgery in April, at which time he will have been off chemo for 2 months. He has already demonstrated a response to only 2 cycles, and as he had poor tolerance I do not feel there is much utility to further treatment prior to surgery. I reviewed the details of a staging laparoscopy and Whipple, and counseled that if his disease is found to be more extensive intraop (I.e. is metastatic, has any arterial involvement or requires more than a short-segment vein resection) I will abort the resection. He expressed understanding and agrees to proceed. ?  ?Will need to obtain cardiology  clearance to hold Eliquis for 48 hours prior to surgery. Will also coordinate to have vascular surgery availability in case of need for vein resection. ? ?Michaelle Birks, MD ?[facility] ?[place]

## 2021-10-29 NOTE — Telephone Encounter (Signed)
Entry error

## 2021-10-29 NOTE — H&P (View-Only) (Signed)
History of Present Illness: ?Angel Keller is a 81 y.o. male who is seen today for follow up of pancreatic cancer. He was treated with neoadjuvant gem/abraxane, but tolerated poorly due to fatigue and nausea. Treatment was stopped in early February after 2 cycles due to intolerance. He had percutaneous placement of an internal SEMS in the common bile duct on 2/6 and his internal/external biliary drain was removed. He had a repeat staging scan on 10/20/21, which showed decrease in size of the pancreatic mass. ?  ?Today he says he has been improving since stopping chemotherapy. He has had some dizziness and orthostasis, and BP meds have been adjusted. His appetite has significantly improved and he is eating well and consuming 3 Ensures per day. He also exercises at home 3 times weekly. He is ambulating without difficulty. He denies abdominal pain. He is on Eliquis for a-fib, which he had while hospitalized for fevers during his first cycle of chemotherapy. He had labs last week, and LFTs and WBC were normal. His CA19-9 decreased during treatment ([id number]). ?  ?  ?Review of Systems: ?A complete review of systems was obtained from the patient.  I have reviewed this information and discussed as appropriate with the patient.  See HPI as well for other ROS. ?  ?  ?  ?Medical History: ?Past Medical History ?Past Medical History: ?Diagnosis Date ? Aneurysm (CMS-HCC)   ? Anxiety   ? Arthritis   ? GERD (gastroesophageal reflux disease)   ? History of cancer   ? Hyperlipidemia   ? Hypertension   ? Thyroid disease   ? ?  ?  ?Patient Active Problem List ?Diagnosis ? 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 Weight loss, unintentional ? Neoplastic malignant related fatigue ?  ?  ?Past Surgical History ?Past Surgical History: ?Procedure Laterality Date ? [place] ? ?  ?  ?Allergies ?No Known Allergies ? ?  ?Current Outpatient Medications on File  Prior to Visit ?Medication Sig Dispense Refill ? aspirin 81 MG EC tablet Take by mouth     ? bisoprolol (ZEBETA) 5 MG tablet       ? cholecalciferol (VITAMIN D3) 2,000 unit tablet Take by mouth     ? cyanocobalamin (VITAMIN B12) 1000 MCG tablet Take by mouth     ? escitalopram oxalate (LEXAPRO) 20 MG tablet       ? famotidine (PEPCID) 20 MG tablet Take 20 mg by mouth 2 (two) times daily     ? levothyroxine (SYNTHROID) 112 MCG tablet Take by mouth     ? sodium chloride 0.9 %, flush, sycp 10 mLs by Intracatheter route once daily 300 mL 5 ?  ?No current facility-administered medications on file prior to visit. ?  ?  ?Family History ?Family History ?Problem Relation Age of Onset ? Breast cancer Mother   ? ?  ?  ?Social History ?  ?Tobacco Use ?Smoking Status Former ? Types: Cigarettes ?Smokeless Tobacco Never ?  ?  ?Social History ?Social History ?  ? ?Socioeconomic History ? Marital status: Widowed ?Tobacco Use ? Smoking status: Former ?    Types: Cigarettes ? Smokeless tobacco: Never ?Substance and Sexual Activity ? Alcohol use: Never ? Drug use: Never ? ?  ?  ?Objective: ?  ?  ?Vitals: ?  10/29/21 1427 ?BP: 136/84 ?Pulse: 84 ?Temp: 36.6 ?C (97.8 ?F) ?SpO2: 98% ?Weight: 66.6 kg (146 lb 12.8 oz) ?  ?Body mass  index is 22.65 kg/m?. ?  ?Physical Exam ?Vitals reviewed.  ?Constitutional:   ?   General: He is not in acute distress. ?   Appearance: Normal appearance.  ?HENT:  ?   Head: Normocephalic and atraumatic.  ?Eyes:  ?   General: No scleral icterus. ?   Conjunctiva/sclera: Conjunctivae normal.  ?Cardiovascular:  ?   Rate and Rhythm: Normal rate and regular rhythm.  ?   Heart sounds: No murmur heard. ?Pulmonary:  ?   Effort: Pulmonary effort is normal. No respiratory distress.  ?   Breath sounds: Normal breath sounds.  ?Abdominal:  ?   General: There is no distension.  ?   Palpations: Abdomen is soft.  ?   Tenderness: There is no abdominal tenderness.  ?Musculoskeletal:     ?   General: No swelling or deformity. Normal  range of motion.  ?   Cervical back: Normal range of motion.  ?Skin: ?   General: Skin is warm and dry.  ?   Coloration: Skin is not jaundiced.  ?Neurological:  ?   General: No focal deficit present.  ?   Mental Status: He is alert and oriented to person, place, and time.  ?Psychiatric:     ?   Mood and Affect: Mood normal.     ?   Behavior: Behavior normal.     ?   Thought Content: Thought content normal.  ?  ?  ?  ?  ?Labs, Imaging and Diagnostic Testing: ?CT abd/pelvis 10/20/21: ?IMPRESSION: ?  ?1. Response to therapy of pancreatic head/neck primary. ?  ?2. 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 The previously ?  ?described hepatic artery abutment is no longer identified. ?  ?3. Biliary stent in place with pneumobilia. Interval removal of ?  ?percutaneous biliary drain. ?  ?4. No findings of metastatic disease. ?  ?5. Slight improvement in mild pancreatic duct dilatation. ?  ?6. Aortic atherosclerosis (ICD10-I70.0), coronary artery ?  ?atherosclerosis and emphysema (ICD10-J43.9). ?  ?7. Prostatomegaly with probable secondary bladder wall thickening. ?  ?  ?Assessment and Plan: ?   ?Diagnoses and all orders for this visit: ?  ?Pancreatic adenocarcinoma (CMS-HCC) ?  ?  ?This is an 81 yo male with pancreatic adenocarcinoma. I personally reviewed his pre- and post-treatment CT scans. His initial scan showed clear venous involvement at the PV/SMV confluence, which has improved. There was also concern for hepatic artery abutment on the first scan, which appears to have resolved. There remains mild venous distortion on the latest CT where the tumor abuts the proximal SMV, but the vein is patient with no collateralization, and the SMA and hepatic artery are free of disease. There is conventional hepatic arterial anatomy. This appears to be resectable, but may require resection of a short segment of SMV. ?  ?I had an extensive discussion with the patient regarding surgery, which would entail a staging laparoscopy,  and open Whipple with possible venous resection. I reviewed in detail the risks, including bleeding, infection, 15% risk of pancreatic fistula, 10-15% risk of delayed gastric emptying, and overall 30% risk of major complication including the above complications but also, blood clot, pneumonia, renal failure, respiratory failure, and 1-3% risk of death. I discussed that at his age he has less functional reserve to tolerate a major complication, and a vascular resection adds risk to an already high-risk operation. I also discussed he may need to go to a rehab or nursing facility at discharge. We reviewed that surgery is the only  potentially curative treatment for pancreatic cancer but carries the highest risk of short-term morbidity and mortality, and that a majority of patients with pancreatic cancer ultimately have disease recurrence. However, he is very motivated and he seems to have significantly improved in the last few weeks, has good nutrition, and is functionally independent and still able to exercise 3 times weekly. Prior to his diagnosis, he went to the gym 3 times weekly. Aside from his a-fib he does not have any major medical comorbidities. After an extensive and detailed discussion of the risks of surgery, he and his daughter both expressed understanding, and the patient very much wants to have surgery. I will schedule him for surgery in April, at which time he will have been off chemo for 2 months. He has already demonstrated a response to only 2 cycles, and as he had poor tolerance I do not feel there is much utility to further treatment prior to surgery. I reviewed the details of a staging laparoscopy and Whipple, and counseled that if his disease is found to be more extensive intraop (I.e. is metastatic, has any arterial involvement or requires more than a short-segment vein resection) I will abort the resection. He expressed understanding and agrees to proceed. ?  ?Will need to obtain cardiology  clearance to hold Eliquis for 48 hours prior to surgery. Will also coordinate to have vascular surgery availability in case of need for vein resection. ? ?Michaelle Birks, MD ?[facility] ?[place]

## 2021-11-03 ENCOUNTER — Telehealth: Payer: Self-pay | Admitting: Hematology

## 2021-11-03 NOTE — Telephone Encounter (Signed)
.  Called patient to schedule appointment per 3/20 inbasket, patient daughter is aware of date and time.   ?

## 2021-11-08 ENCOUNTER — Telehealth: Payer: Self-pay | Admitting: Nurse Practitioner

## 2021-11-08 ENCOUNTER — Other Ambulatory Visit: Payer: Self-pay | Admitting: Nurse Practitioner

## 2021-11-08 ENCOUNTER — Ambulatory Visit: Payer: Medicare HMO

## 2021-11-08 ENCOUNTER — Ambulatory Visit: Payer: Medicare HMO | Admitting: Nurse Practitioner

## 2021-11-08 ENCOUNTER — Other Ambulatory Visit: Payer: Medicare HMO

## 2021-11-08 DIAGNOSIS — F321 Major depressive disorder, single episode, moderate: Secondary | ICD-10-CM

## 2021-11-08 MED ORDER — ESCITALOPRAM OXALATE 20 MG PO TABS: 20.0000 mg | ORAL_TABLET | Freq: Every day | ORAL | 1 refills | Status: DC

## 2021-11-08 NOTE — Pre-Procedure Instructions (Signed)
Surgical Instructions ? ? ? Your procedure is scheduled on Monday, April 3rd. ? Report to [facility] Main Entrance "A" at 5:30 A.M., then check in with the Admitting office. ? Call this number if you have problems the morning of surgery: ? [phone] ? ? If you have any questions prior to your surgery date call [phone]: Open Monday-Friday 8am-4pm ? ? ? Remember: ? Do not eat after midnight the night before your surgery ? ?You may drink clear liquids until 4:30 a.m. the morning of your surgery.   ?Clear liquids allowed are: Water, Non-Citrus Juices (without pulp), Carbonated Beverages, Clear Tea, Black Coffee ONLY (NO MILK, CREAM OR POWDERED CREAMER of any kind), and Gatorade. ? ? ?Patient Instructions ? ?The day of surgery (if you do NOT have diabetes):  ?Drink TWO (2) Pre-Surgery Clear Ensures the night before surgery, one at dinner time and one at bedtime.   ?Drink ONE (1) Pre-Surgery Clear Ensure the by 4:30 am the morning of surgery. ?These drinks were given to you during your hospital  ?pre-op appointment visit. ?Nothing else to drink after completing the  ?Pre-Surgery Clear Ensure. ? ?       If you have questions, please contact your surgeon?s office. ? ?  ? Take these medicines the morning of surgery with A SIP OF WATER:  ?escitalopram (LEXAPRO) ?famotidine (PEPCID) ?pantoprazole (PROTONIX)  ?SYNTHROID  ? ?As needed: ?diltiazem (CARDIZEM) ?prochlorperazine (COMPAZINE) ? ?Follow your surgeon's instructions on when to stop apixaban (ELIQUIS).  If no instructions were given by your surgeon then you will need to call the office to get those instructions.   ? ? ?As of today, STOP taking any Aspirin (unless otherwise instructed by your surgeon) Aleve, Naproxen, Ibuprofen, Motrin, Advil, Goody's, BC's, all herbal medications, fish oil, and all vitamins. ? ?         ?Do not wear jewelry. ?Do not wear lotions, powders, colognes, or deodorant. ?Men may shave face and neck. ?Do not bring valuables to the  hospital. ? ?[place] is not responsible for any belongings or valuables. .  ? ?Do NOT Smoke (Tobacco/Vaping)  24 hours prior to your procedure ? ?If you use a CPAP at night, you may bring your mask for your overnight stay. ?  ?Contacts, glasses, hearing aids, dentures or partials may not be worn into surgery, please bring cases for these belongings ?  ?For patients admitted to the hospital, discharge time will be determined by your treatment team. ?  ?Patients discharged the day of surgery will not be allowed to drive home, and someone needs to stay with them for 24 hours. ? ? ?SURGICAL WAITING ROOM VISITATION ?Patients having surgery or a procedure in a hospital may have two support people. ?Children under the age of 28 must have an adult with them who is not the patient. ?They may stay in the waiting area during the procedure and may switch out with other visitors. If the patient needs to stay at the hospital during part of their recovery, the visitor guidelines for inpatient rooms apply. ? ?Please refer to the [place] website for the visitor guidelines for Inpatients (after your surgery is over and you are in a regular room).  ? ? ? ? ? ?Special instructions:   ? ?Oral Hygiene is also important to reduce your risk of infection.  Remember - BRUSH YOUR TEETH THE MORNING OF SURGERY WITH YOUR REGULAR TOOTHPASTE ? ? ?[place]- Preparing For Surgery ? ?Before surgery, you can play an important  role. Because skin is not sterile, your skin needs to be as free of germs as possible. You can reduce the number of germs on your skin by washing with CHG (chlorahexidine gluconate) Soap before surgery.  CHG is an antiseptic cleaner which kills germs and bonds with the skin to continue killing germs even after washing.   ? ? ?Please do not use if you have an allergy to CHG or antibacterial soaps. If your skin becomes reddened/irritated stop using the CHG.  ?Do not shave (including legs and underarms) for at least 48  hours prior to first CHG shower. It is OK to shave your face. ? ?Please follow these instructions carefully. ?  ? ? Shower the NIGHT BEFORE SURGERY and the MORNING OF SURGERY with CHG Soap.  ? If you chose to wash your hair, wash your hair first as usual with your normal shampoo. After you shampoo, rinse your hair and body thoroughly to remove the shampoo.  Then [employer] and genitals (private parts) with your normal soap and rinse thoroughly to remove soap. ? ?After that Use CHG Soap as you would any other liquid soap. You can apply CHG directly to the skin and wash gently with a scrungie or a clean washcloth.  ? ?Apply the CHG Soap to your body ONLY FROM THE NECK DOWN.  Do not use on open wounds or open sores. Avoid contact with your eyes, ears, mouth and genitals (private parts). Wash Face and genitals (private parts)  with your normal soap.  ? ?Wash thoroughly, paying special attention to the area where your surgery will be performed. ? ?Thoroughly rinse your body with warm water from the neck down. ? ?DO NOT shower/wash with your normal soap after using and rinsing off the CHG Soap. ? ?Pat yourself dry with a CLEAN TOWEL. ? ?Wear CLEAN PAJAMAS to bed the night before surgery ? ?Place CLEAN SHEETS on your bed the night before your surgery ? ?DO NOT SLEEP WITH PETS. ? ? ?Day of Surgery: ? ?Take a shower with CHG soap. ?Wear Clean/Comfortable clothing the morning of surgery ?Do not apply any deodorants/lotions.   ?Remember to brush your teeth WITH YOUR REGULAR TOOTHPASTE. ? ? ? ?If you received a COVID test during your pre-op visit  it is requested that you wear a mask when out in public, stay away from anyone that may not be feeling well and notify your surgeon if you develop symptoms. If you have been in contact with anyone that has tested positive in the last 10 days please notify you surgeon. ? ?  ?Please read over the following fact sheets that you were given.   ?

## 2021-11-08 NOTE — Telephone Encounter (Signed)
I renewed this and sent to [facility]

## 2021-11-08 NOTE — Telephone Encounter (Signed)
Patient requesting refill of Lexapro and would like it sent to [facility]. Please advise. [phone] ?

## 2021-11-08 NOTE — Telephone Encounter (Signed)
Patient is aware 

## 2021-11-09 ENCOUNTER — Encounter (HOSPITAL_COMMUNITY): Payer: Self-pay

## 2021-11-09 ENCOUNTER — Encounter (HOSPITAL_COMMUNITY)
Admission: RE | Admit: 2021-11-09 | Discharge: 2021-11-09 | Disposition: A | Discharge status: Home / Self Care | Payer: Medicare HMO | Source: Ambulatory Visit | Attending: Surgery | Admitting: Surgery

## 2021-11-09 ENCOUNTER — Other Ambulatory Visit: Payer: Self-pay

## 2021-11-09 VITALS — BP 151/84 | HR 64 | Temp 98.2°F | Resp 17 | Ht 70.0 in | Wt 148.8 lb

## 2021-11-09 DIAGNOSIS — Z01812 Encounter for preprocedural laboratory examination: Secondary | ICD-10-CM | POA: Diagnosis present

## 2021-11-09 DIAGNOSIS — Z7901 Long term (current) use of anticoagulants: Secondary | ICD-10-CM | POA: Diagnosis not present

## 2021-11-09 DIAGNOSIS — I10 Essential (primary) hypertension: Secondary | ICD-10-CM | POA: Insufficient documentation

## 2021-11-09 DIAGNOSIS — Z9221 Personal history of antineoplastic chemotherapy: Secondary | ICD-10-CM | POA: Diagnosis not present

## 2021-11-09 DIAGNOSIS — Z01818 Encounter for other preprocedural examination: Secondary | ICD-10-CM

## 2021-11-09 DIAGNOSIS — G4733 Obstructive sleep apnea (adult) (pediatric): Secondary | ICD-10-CM | POA: Diagnosis not present

## 2021-11-09 DIAGNOSIS — C250 Malignant neoplasm of head of pancreas: Secondary | ICD-10-CM | POA: Insufficient documentation

## 2021-11-09 DIAGNOSIS — D649 Anemia, unspecified: Secondary | ICD-10-CM | POA: Diagnosis not present

## 2021-11-09 DIAGNOSIS — I4891 Unspecified atrial fibrillation: Secondary | ICD-10-CM | POA: Insufficient documentation

## 2021-11-09 DIAGNOSIS — E785 Hyperlipidemia, unspecified: Secondary | ICD-10-CM | POA: Insufficient documentation

## 2021-11-09 LAB — COMPREHENSIVE METABOLIC PANEL
ALT: 19 U/L (ref 0–44)
AST: 29 U/L (ref 15–41)
Albumin: 3.3 g/dL — ABNORMAL LOW (ref 3.5–5.0)
Alkaline Phosphatase: 55 U/L (ref 38–126)
Anion gap: 7 (ref 5–15)
BUN: 25 mg/dL — ABNORMAL HIGH (ref 8–23)
CO2: 27 mmol/L (ref 22–32)
Calcium: 9.1 mg/dL (ref 8.9–10.3)
Chloride: 105 mmol/L (ref 98–111)
Creatinine, Ser: 1.18 mg/dL (ref 0.61–1.24)
GFR, Estimated: >60 mL/min (ref >60)
Glucose, Bld: 111 mg/dL — ABNORMAL HIGH (ref 70–99)
Potassium: 4.5 mmol/L (ref 3.5–5.1)
Sodium: 139 mmol/L (ref 135–145)
Total Bilirubin: 0.7 mg/dL (ref 0.3–1.2)
Total Protein: 6.6 g/dL (ref 6.5–8.1)

## 2021-11-09 LAB — CBC
HCT: 35.3 % — ABNORMAL LOW (ref 39.0–52.0)
Hemoglobin: 11.4 g/dL — ABNORMAL LOW (ref 13.0–17.0)
MCH: 28.4 pg (ref 26.0–34.0)
MCHC: 32.3 g/dL (ref 30.0–36.0)
MCV: 88 fL (ref 80.0–100.0)
Platelets: 180 10*3/uL (ref 150–400)
RBC: 4.01 MIL/uL — ABNORMAL LOW (ref 4.22–5.81)
RDW: 17 % — ABNORMAL HIGH (ref 11.5–15.5)
WBC: 7.2 10*3/uL (ref 4.0–10.5)
nRBC: 0 % (ref 0.0–0.2)

## 2021-11-09 NOTE — Progress Notes (Addendum)
PCP - Leretha Pol, NP ?Cardiologist - Buford Dresser, MD ? ?PPM/ICD - denies ?Device Orders - n/a ?Rep Notified - n/a ? ?Chest x-ray - 08/27/2021 ?EKG - 09/29/2021 ?Stress Test - denies ?ECHO - denies ?Cardiac Cath - denies ? ?Sleep Study - patient has diagnostic of OSA but he verbalized he didn't have a sleep study done in the past ?CPAP - n/a ? ?Fasting Blood Sugar - n/a ? ?Blood Thinner Instructions: Eliquis - patient was instructed to call MD and ask when to stop taking Eliquis  ? ?Aspirin Instructions: Patient was instructed: As of today, STOP taking any Aspirin (unless otherwise instructed by your surgeon) Aleve, Naproxen, Ibuprofen, Motrin, Advil, Goody's, BC's, all herbal medications, fish oil, and all vitamins. ? ?ERAS Protcol - yes  ?PRE-SURGERY Ensure x 3 - until 04:30 o'clock ? ?COVID TEST- n/a ? ? ?Anesthesia review: yes; history of A Fib.; A request for cardiac clearance was sent on 10/29/2021 by [facility] to [facility]. Patient was hospitalized with high temp in January after a chemo treatment; he finished his chemotherapy in the beginning of March ? ?Patient denies shortness of breath, fever, cough and chest pain at PAT appointment ? ? ?All instructions explained to the patient, with a verbal understanding of the material. Patient agrees to go over the instructions while at home for a better understanding. Patient also instructed to self quarantine after being tested for COVID-19. The opportunity to ask questions was provided. ?  ?

## 2021-11-09 NOTE — Pre-Procedure Instructions (Signed)
Surgical Instructions ? ? ? Your procedure is scheduled on Monday, April 3rd. ? Report to [facility] Main Entrance "A" at 5:30 A.M., then check in with the Admitting office. ? Call this number if you have problems the morning of surgery: ? [phone] ? ? If you have any questions prior to your surgery date call [phone]: Open Monday-Friday 8am-4pm ? ? ? Remember: ? Do not eat after midnight the night before your surgery ? ?You may drink clear liquids until 4:30 a.m. the morning of your surgery.   ?Clear liquids allowed are: Water, Non-Citrus Juices (without pulp), Carbonated Beverages, Clear Tea, Black Coffee ONLY (NO MILK, CREAM OR POWDERED CREAMER of any kind), and Gatorade. ? ? ?Patient Instructions ? ?The day of surgery (if you do NOT have diabetes):  ?Drink TWO (2) Pre-Surgery Clear Ensures the night before surgery, one at dinner time and one at bedtime.   ?Drink ONE (1) Pre-Surgery Clear Ensure the by 4:30 am the morning of surgery. ?These drinks were given to you during your hospital  ?pre-op appointment visit. ?Nothing else to drink after completing the  ?Pre-Surgery Clear Ensure. ? ?       If you have questions, please contact your surgeon?s office. ? ?  ? Take these medicines the morning of surgery with A SIP OF WATER:  ? ?escitalopram (LEXAPRO) ?famotidine (PEPCID) ?pantoprazole (PROTONIX)  ?SYNTHROID  ? ?As needed: ? ?diltiazem (CARDIZEM) ?prochlorperazine (COMPAZINE) ? ?Follow your surgeon's instructions on when to stop apixaban (ELIQUIS).  If no instructions were given by your surgeon then you will need to call the office to get those instructions.   ? ?As of today, STOP taking any Aspirin (unless otherwise instructed by your surgeon) Aleve, Naproxen, Ibuprofen, Motrin, Advil, Goody's, BC's, all herbal medications, fish oil, and all vitamins. ? ? The day of surgery: ?         ?Do not wear jewelry. ?Do not wear lotions, powders, colognes, or deodorant. ?Men may shave face and neck. ?Do not bring  valuables to the hospital. ? ?[place] is not responsible for any belongings or valuables. .  ? ?Do NOT Smoke (Tobacco/Vaping)  24 hours prior to your procedure ? ?If you use a CPAP at night, you may bring your mask for your overnight stay. ?  ?Contacts, glasses, hearing aids, dentures or partials may not be worn into surgery, please bring cases for these belongings ?  ?For patients admitted to the hospital, discharge time will be determined by your treatment team. ?  ?Patients discharged the day of surgery will not be allowed to drive home, and someone needs to stay with them for 24 hours. ? ? ?SURGICAL WAITING ROOM VISITATION ?Patients having surgery or a procedure in a hospital may have two support people. ?Children under the age of 14 must have an adult with them who is not the patient. ?They may stay in the waiting area during the procedure and may switch out with other visitors. If the patient needs to stay at the hospital during part of their recovery, the visitor guidelines for inpatient rooms apply. ? ?Please refer to the [place] website for the visitor guidelines for Inpatients (after your surgery is over and you are in a regular room).  ? ? ?Special instructions:   ? ?Oral Hygiene is also important to reduce your risk of infection.  Remember - BRUSH YOUR TEETH THE MORNING OF SURGERY WITH YOUR REGULAR TOOTHPASTE ? ? ?[place]- Preparing For Surgery ? ?Before surgery, you can  play an important role. Because skin is not sterile, your skin needs to be as free of germs as possible. You can reduce the number of germs on your skin by washing with CHG (chlorahexidine gluconate) Soap before surgery.  CHG is an antiseptic cleaner which kills germs and bonds with the skin to continue killing germs even after washing.   ? ? ?Please do not use if you have an allergy to CHG or antibacterial soaps. If your skin becomes reddened/irritated stop using the CHG.  ?Do not shave (including legs and underarms) for at  least 48 hours prior to first CHG shower. It is OK to shave your face. ? ?Please follow these instructions carefully. ?  ? ? Shower the NIGHT BEFORE SURGERY and the MORNING OF SURGERY with CHG Soap.  ? If you chose to wash your hair, wash your hair first as usual with your normal shampoo. After you shampoo, rinse your hair and body thoroughly to remove the shampoo.  Then [employer] and genitals (private parts) with your normal soap and rinse thoroughly to remove soap. ? ?After that Use CHG Soap as you would any other liquid soap. You can apply CHG directly to the skin and wash gently with a scrungie or a clean washcloth.  ? ?Apply the CHG Soap to your body ONLY FROM THE NECK DOWN.  Do not use on open wounds or open sores. Avoid contact with your eyes, ears, mouth and genitals (private parts). Wash Face and genitals (private parts)  with your normal soap.  ? ?Wash thoroughly, paying special attention to the area where your surgery will be performed. ? ?Thoroughly rinse your body with warm water from the neck down. ? ?DO NOT shower/wash with your normal soap after using and rinsing off the CHG Soap. ? ?Pat yourself dry with a CLEAN TOWEL. ? ?Wear CLEAN PAJAMAS to bed the night before surgery ? ?Place CLEAN SHEETS on your bed the night before your surgery ? ?DO NOT SLEEP WITH PETS. ? ? ?Day of Surgery: ? ?Take a shower with CHG soap. ?Wear Clean/Comfortable clothing the morning of surgery ?Do not apply any deodorants/lotions.   ?Remember to brush your teeth WITH YOUR REGULAR TOOTHPASTE. ? ? ? ?If you received a COVID test during your pre-op visit  it is requested that you wear a mask when out in public, stay away from anyone that may not be feeling well and notify your surgeon if you develop symptoms. If you have been in contact with anyone that has tested positive in the last 10 days please notify you surgeon. ? ?  ?Please read over the following fact sheets that you were given.   ?

## 2021-11-10 ENCOUNTER — Telehealth (HOSPITAL_BASED_OUTPATIENT_CLINIC_OR_DEPARTMENT_OTHER): Payer: Self-pay

## 2021-11-10 DIAGNOSIS — I700 Atherosclerosis of aorta: Secondary | ICD-10-CM | POA: Insufficient documentation

## 2021-11-10 NOTE — Telephone Encounter (Signed)
? ?  Name: Angel BERKHEIMER Sr.  ?DOB: October 18, 1940  ?MRN: [id number]  ? ?Primary Cardiologist: Buford Dresser, MD ? ?Chart reviewed as part of pre-operative protocol coverage. Patient was contacted 11/10/2021 in reference to pre-operative risk assessment for pending surgery as outlined below.  Angel Celeste Sr. was last seen on 09/29/2021 by Dr. Buford Dresser.  Since that day, Angel JESUS Sr. has done well without exertional chest pain or worsening dyspnea.  He can achieve more than 4 METS of activity. ? ?Therefore, based on ACC/AHA guidelines, the patient would be at acceptable risk for the planned procedure without further cardiovascular testing.  ? ?Patient may hold Eliquis for 2 days prior to the procedure and restart as soon as possible afterward at the surgeon's discretion. ? ?The patient was advised that if he develops new symptoms prior to surgery to contact our office to arrange for a follow-up visit, and he verbalized understanding. ? ?I will route this recommendation to the requesting party via Epic fax function and remove from pre-op pool. Please call with questions. ? ?Almyra Deforest, [place] ?11/10/2021, 1:56 PM ? ?

## 2021-11-10 NOTE — Progress Notes (Signed)
Anesthesia Chart Review: ? 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 Cardiac clearance per telephone encounter 11/10/21 by Almyra Deforest, PA, "I have called and spoke with patient's daughter who takes care of him. Per her daughter, he has managed to gain 9 lbs since stopping chemo. He has better appetite. Prior to all this he was going to the gym 3 days a week. Now he is still very much independent and takes care of himself. His daughter took the [facility] to [facility] and [facility] yesterday, he ambulated more than 4 minutes in the supermarket. He has no probably wall 2 blocks away from home and back. He has no problem walk up 2 flights of stairs. I will go ahead and clear him as a Low-Moderate risk patient (given deconditioning) going for a high risk surgery. Reviewed this decision with Dr. Harrell Gave as well." Per pharmacy, pt can hold Eliquis 2d prior.  ? ?Follows with oncology for hx of pancreatic cancer. ? ?Equivocal hx of OSA. Pt is not on CPAP. ? ?Preop labs reviewed, mild anemia Hgb 11.4, otherwise unremarkable.  ? ?EKG 09/29/21: Sinus bradycardia. Rate 55. Anterolateral t wave abnormality, consider ischemia. ? ?TTE 08/28/21: ? 1. Left ventricular ejection fraction, by estimation, is 60 to 65%. The  ?left ventricle has normal function. The left ventricle has no regional  ?wall motion abnormalities. Left ventricular diastolic parameters are  ?consistent with Grade I diastolic  ?dysfunction (impaired relaxation).  ? 2. Right ventricular systolic function is normal. The right ventricular  ?size is normal. There is normal pulmonary artery systolic pressure.  ? 3. Left atrial size was mildly dilated.  ? 4. Right atrial size was mildly dilated.  ? 5. The mitral valve is grossly normal. Trivial mitral valve  ?regurgitation.  ? 6. The aortic valve is tricuspid. There is mild calcification of the  ?aortic valve. There is mild thickening of the aortic valve. Aortic valve  ?regurgitation is not visualized.  Aortic valve sclerosis/calcification is  ?present, without any evidence of  ?aortic stenosis.  ? 7. The inferior vena cava is normal in size with greater than 50%  ?respiratory variability, suggesting right atrial pressure of 3 mmHg.  ? ? ? ?Karoline Caldwell, PA-C ?[facility]/Anesthesiology ?Phone [phone] ?11/10/2021 2:05 PM ? ? ?

## 2021-11-10 NOTE — Telephone Encounter (Signed)
Patient with diagnosis of afib on Eliquis for anticoagulation.   ? ?Procedure: whipple surgery ?Date of procedure: 11/15/21 ? ?CHA2DS2-VASc Score = 4  ?This indicates a 4.8% annual risk of stroke. ?The patient's score is based upon: ?CHF History: 0 ?HTN History: 1 ?Diabetes History: 0 ?Stroke History: 0 ?Vascular Disease History: 1 ?Age Score: 2 ?Gender Score: 0 ? ?Aortic atherosclerosis noted on 10/2021 abdominal CT, problem list updated. ? 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 ? ?CrCl 54m/min ?Platelet count 180K ? ?Per office protocol, patient can hold Eliquis for 2 days prior to procedure as requested.   ?

## 2021-11-10 NOTE — Telephone Encounter (Signed)
I have called and spoke with patient's daughter who takes care of him. Per her daughter, he has managed to gain 9 lbs since stopping chemo. He has better appetite. Prior to all this he was going to the gym 3 days a week. Now he is still very much independent and takes care of himself. His daughter took the [facility] to [facility] [employer] [employer] yesterday, he ambulated more than 4 minutes in the supermarket. He has no probably wall 2 blocks away from home and back. He has no problem walk up 2 flights of stairs. I will go ahead and clear him as a Low-Moderate risk patient (given deconditioning) going for a high risk surgery. Reviewed this decision with Dr. Harrell Gave as well. ? ?Awaiting final recommendation by clinical pharmacist.  ?

## 2021-11-10 NOTE — Anesthesia Preprocedure Evaluation (Addendum)
Anesthesia Evaluation  ?Patient identified by MRN, date of birth, ID band ?Patient awake ? ? ? ?Reviewed: ?Allergy & Precautions, NPO status , Patient's Chart, lab work & pertinent test results ? ?Airway ?Mallampati: II ? ?TM Distance: >3 FB ?Neck ROM: Full ? ? ? Dental ?no notable dental hx. ?(+) Edentulous Upper, Dental Advisory Given, Missing, Loose,  ?  ?Pulmonary ?neg sleep apnea, former smoker,  ?Quit smoking 1963 ?  ?Pulmonary exam normal ?breath sounds clear to auscultation ? ? ? ? ? ? Cardiovascular ?hypertension, Pt. on medications ?+CHF (grade 1 diastolic dysfunction) and + DVT (2021)  ?Normal cardiovascular exam+ dysrhythmias (eliquis- last dose >48h) Atrial Fibrillation  ?Rhythm:Regular Rate:Normal ? 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 ?EKG 09/29/21: Sinus bradycardia. Rate 55. Anterolateral t wave abnormality, consider ischemia. ? ?TTE 08/28/21: ??1. Left ventricular ejection fraction, by estimation, is 60 to 65%. The  ?left ventricle has normal function. The left ventricle has no regional  ?wall motion abnormalities. Left ventricular diastolic parameters are  ?consistent with Grade I diastolic  ?dysfunction (impaired relaxation).  ??2. Right ventricular systolic function is normal. The right ventricular  ?size is normal. There is normal pulmonary artery systolic pressure.  ??3. Left atrial size was mildly dilated.  ??4. Right atrial size was mildly dilated.  ??5. The mitral valve is grossly normal. Trivial mitral valve  ?regurgitation.  ??6. The aortic valve is tricuspid. There is mild calcification of the  ?aortic valve. There is mild thickening of the aortic valve. Aortic valve  ?regurgitation is not visualized. Aortic valve sclerosis/calcification is  ?present, without any evidence of  ?aortic stenosis.  ??7. The inferior vena cava is normal in size with greater than 50% respiratory variability, suggesting right atrial  pressure of 3 mmHg.  ? ?  ?Neuro/Psych ? Headaches, PSYCHIATRIC DISORDERS Anxiety Depression   ? GI/Hepatic ?GERD  Medicated and Controlled,Pancreatic ca ?  ?Endo/Other  ?Hypothyroidism  ? Renal/GU ?negative Renal ROS Bladder dysfunction ? ? ? ?  ?Musculoskeletal ? ?(+) Arthritis , Osteoarthritis,   ? Abdominal ?  ?Peds ?negative pediatric ROS ?(+)  Hematology ? ?(+) Blood dyscrasia, anemia , Hb 11.4, plt 180   ?Anesthesia Other Findings ? ? Reproductive/Obstetrics ?negative OB ROS ? ?  ? ? ? ? ? ? ? ? ? ? ? ? ? ?  ?  ? ? ? ? ? ? ?Anesthesia Physical ?Anesthesia Plan ? ?ASA: 3 ? ?Anesthesia Plan: General and Regional  ? ?Post-op Pain Management: Ofirmev IV (intra-op)*, Ketamine IV* and Regional block*  ? ?Induction: Intravenous ? ?PONV Risk Score and Plan: 3 and Ondansetron, Dexamethasone and Treatment may vary due to age or medical condition ? ?Airway Management Planned: Oral ETT ? ?Additional Equipment: Arterial line ? ?Intra-op Plan:  ? ?Post-operative Plan: Extubation in OR ? ?Informed Consent: I have reviewed the patients History and Physical, chart, labs and discussed the procedure including the risks, benefits and alternatives for the proposed anesthesia with the patient or authorized representative who has indicated his/her understanding and acceptance.  ? ? ? ?Dental advisory given ? ?Plan Discussed with: CRNA ? ?Anesthesia Plan Comments: (TAP blocks before wake-up per Dr. Zenia Resides)  ? ? ? ? ?Anesthesia Quick Evaluation ? ?

## 2021-11-10 NOTE — Telephone Encounter (Signed)
Pending high risk surgery next Monday.  Patient was seen last month by Dr. Harrell Gave.  We will reach out to MD for cardiac clearance. ?

## 2021-11-10 NOTE — Telephone Encounter (Signed)
Discussed with Dr. Harrell Gave. Per Dr. Harrell Gave, "overall poor functional status but more due to the cancer than heart issues. I did not specifically ask about METs. He does not have a history of CAD but had coronary calcium. If he can do 4 mets i'd clear him; if not then I would nuc him if we can get it in before his surgery." ?

## 2021-11-10 NOTE — Telephone Encounter (Signed)
Clinical pharmacist to review Eliquis 

## 2021-11-10 NOTE — Telephone Encounter (Signed)
? ?  Pre-operative Risk Assessment  ?  ?Patient Name: Angel MOCH Sr.  ?DOB: [birth year] ?MRN: [id number]  ? ?  ? ?Request for Surgical Clearance   ? ?Procedure:   Whipple Surgery ? ?Date of Surgery:  Clearance 11/15/21                              ?   ?Surgeon:  Michaelle Birks, MD ?Surgeon's Group or Practice Name:  [facility] ?Phone number:  [phone] ?Fax number:  [phone] ATTN: Merideth Abbey, CNA ?  ?Type of Clearance Requested:   ?- Medical  ? ?Pt on Eliquis but surgeon didn't specify if needing to hold ?  ?Type of Anesthesia:  General  ?  ?Additional requests/questions:   ? ?Signed, ?Meryl Crutch   ?11/10/2021, 8:40 AM   ?

## 2021-11-10 NOTE — Telephone Encounter (Signed)
Calling back to say that they need the Eliquis held for 48 hrs. Please advise ?

## 2021-11-15 ENCOUNTER — Other Ambulatory Visit: Payer: Self-pay

## 2021-11-15 ENCOUNTER — Inpatient Hospital Stay (HOSPITAL_COMMUNITY): Payer: Medicare HMO | Admitting: Anesthesiology

## 2021-11-15 ENCOUNTER — Other Ambulatory Visit: Payer: Medicare HMO

## 2021-11-15 ENCOUNTER — Encounter (HOSPITAL_COMMUNITY)
Admission: RE | Disposition: A | Discharge status: Home Health | Payer: Self-pay | Source: Ambulatory Visit | Attending: Surgery

## 2021-11-15 ENCOUNTER — Encounter (HOSPITAL_COMMUNITY): Payer: Self-pay | Admitting: Surgery

## 2021-11-15 ENCOUNTER — Ambulatory Visit: Payer: Medicare HMO

## 2021-11-15 ENCOUNTER — Inpatient Hospital Stay (HOSPITAL_COMMUNITY)
Admission: RE | Admit: 2021-11-15 | Discharge: 2021-11-22 | DRG: 405 | Disposition: A | Discharge status: Home Health | Payer: Medicare HMO | Source: Ambulatory Visit | Attending: Surgery | Admitting: Surgery

## 2021-11-15 ENCOUNTER — Inpatient Hospital Stay (HOSPITAL_COMMUNITY): Payer: Medicare HMO | Admitting: Physician Assistant

## 2021-11-15 DIAGNOSIS — E43 Unspecified severe protein-calorie malnutrition: Secondary | ICD-10-CM | POA: Diagnosis not present

## 2021-11-15 DIAGNOSIS — Z79899 Other long term (current) drug therapy: Secondary | ICD-10-CM

## 2021-11-15 DIAGNOSIS — C259 Malignant neoplasm of pancreas, unspecified: Secondary | ICD-10-CM

## 2021-11-15 DIAGNOSIS — Z9221 Personal history of antineoplastic chemotherapy: Secondary | ICD-10-CM | POA: Diagnosis not present

## 2021-11-15 DIAGNOSIS — K219 Gastro-esophageal reflux disease without esophagitis: Secondary | ICD-10-CM | POA: Diagnosis present

## 2021-11-15 DIAGNOSIS — I110 Hypertensive heart disease with heart failure: Secondary | ICD-10-CM

## 2021-11-15 DIAGNOSIS — I10 Essential (primary) hypertension: Secondary | ICD-10-CM | POA: Diagnosis present

## 2021-11-15 DIAGNOSIS — K30 Functional dyspepsia: Secondary | ICD-10-CM | POA: Diagnosis not present

## 2021-11-15 DIAGNOSIS — I4891 Unspecified atrial fibrillation: Secondary | ICD-10-CM | POA: Diagnosis present

## 2021-11-15 DIAGNOSIS — Z87891 Personal history of nicotine dependence: Secondary | ICD-10-CM

## 2021-11-15 DIAGNOSIS — Z79890 Hormone replacement therapy (postmenopausal): Secondary | ICD-10-CM | POA: Diagnosis not present

## 2021-11-15 DIAGNOSIS — Z7982 Long term (current) use of aspirin: Secondary | ICD-10-CM | POA: Diagnosis not present

## 2021-11-15 DIAGNOSIS — K861 Other chronic pancreatitis: Secondary | ICD-10-CM | POA: Diagnosis not present

## 2021-11-15 DIAGNOSIS — C250 Malignant neoplasm of head of pancreas: Secondary | ICD-10-CM | POA: Diagnosis not present

## 2021-11-15 DIAGNOSIS — K831 Obstruction of bile duct: Secondary | ICD-10-CM | POA: Diagnosis present

## 2021-11-15 DIAGNOSIS — R530 Neoplastic (malignant) related fatigue: Secondary | ICD-10-CM | POA: Diagnosis present

## 2021-11-15 DIAGNOSIS — K743 Primary biliary cirrhosis: Secondary | ICD-10-CM | POA: Diagnosis not present

## 2021-11-15 DIAGNOSIS — Z6821 Body mass index (BMI) 21.0-21.9, adult: Secondary | ICD-10-CM | POA: Diagnosis not present

## 2021-11-15 DIAGNOSIS — I509 Heart failure, unspecified: Secondary | ICD-10-CM

## 2021-11-15 DIAGNOSIS — C257 Malignant neoplasm of other parts of pancreas: Secondary | ICD-10-CM | POA: Diagnosis not present

## 2021-11-15 DIAGNOSIS — E039 Hypothyroidism, unspecified: Secondary | ICD-10-CM

## 2021-11-15 DIAGNOSIS — E785 Hyperlipidemia, unspecified: Secondary | ICD-10-CM | POA: Diagnosis not present

## 2021-11-15 DIAGNOSIS — G8918 Other acute postprocedural pain: Secondary | ICD-10-CM | POA: Diagnosis not present

## 2021-11-15 DIAGNOSIS — D630 Anemia in neoplastic disease: Secondary | ICD-10-CM | POA: Diagnosis not present

## 2021-11-15 DIAGNOSIS — K8012 Calculus of gallbladder with acute and chronic cholecystitis without obstruction: Secondary | ICD-10-CM | POA: Diagnosis not present

## 2021-11-15 HISTORY — PX: LAPAROSCOPY: SHX197

## 2021-11-15 HISTORY — PX: WHIPPLE PROCEDURE: SHX2667

## 2021-11-15 LAB — PREPARE RBC (CROSSMATCH)

## 2021-11-15 LAB — POCT I-STAT 7, (LYTES, BLD GAS, ICA,H+H)
Acid-base deficit: 1 mmol/L (ref 0.0–2.0)
Acid-base deficit: 1 mmol/L (ref 0.0–2.0)
Acid-base deficit: 3 mmol/L — ABNORMAL HIGH (ref 0.0–2.0)
Bicarbonate: 26.6 mmol/L (ref 20.0–28.0)
Bicarbonate: 24.9 mmol/L (ref 20.0–28.0)
Bicarbonate: 23.1 mmol/L (ref 20.0–28.0)
Calcium, Ion: 1.2 mmol/L (ref 1.15–1.40)
Calcium, Ion: 1.18 mmol/L (ref 1.15–1.40)
Calcium, Ion: 1.15 mmol/L (ref 1.15–1.40)
HCT: 31 % — ABNORMAL LOW (ref 39.0–52.0)
HCT: 30 % — ABNORMAL LOW (ref 39.0–52.0)
HCT: 30 % — ABNORMAL LOW (ref 39.0–52.0)
Hemoglobin: 10.5 g/dL — ABNORMAL LOW (ref 13.0–17.0)
Hemoglobin: 10.2 g/dL — ABNORMAL LOW (ref 13.0–17.0)
Hemoglobin: 10.2 g/dL — ABNORMAL LOW (ref 13.0–17.0)
O2 Saturation: 100 %
O2 Saturation: 100 %
O2 Saturation: 100 %
Patient temperature: 35.2
Patient temperature: 35.9
Patient temperature: 36.7
Potassium: 3.9 mmol/L (ref 3.5–5.1)
Potassium: 4 mmol/L (ref 3.5–5.1)
Potassium: 4 mmol/L (ref 3.5–5.1)
Sodium: 138 mmol/L (ref 135–145)
Sodium: 138 mmol/L (ref 135–145)
Sodium: 139 mmol/L (ref 135–145)
TCO2: 28 mmol/L (ref 22–32)
TCO2: 26 mmol/L (ref 22–32)
TCO2: 24 mmol/L (ref 22–32)
pCO2 arterial: 51.9 mmHg — ABNORMAL HIGH (ref 32–48)
pCO2 arterial: 43.5 mmHg (ref 32–48)
pCO2 arterial: 45.2 mmHg (ref 32–48)
pH, Arterial: 7.308 — ABNORMAL LOW (ref 7.35–7.45)
pH, Arterial: 7.36 (ref 7.35–7.45)
pH, Arterial: 7.315 — ABNORMAL LOW (ref 7.35–7.45)
pO2, Arterial: 217 mmHg — ABNORMAL HIGH (ref 83–108)
pO2, Arterial: 255 mmHg — ABNORMAL HIGH (ref 83–108)
pO2, Arterial: 268 mmHg — ABNORMAL HIGH (ref 83–108)

## 2021-11-15 LAB — COMPREHENSIVE METABOLIC PANEL
ALT: 53 U/L — ABNORMAL HIGH (ref 0–44)
AST: 89 U/L — ABNORMAL HIGH (ref 15–41)
Albumin: 2.9 g/dL — ABNORMAL LOW (ref 3.5–5.0)
Alkaline Phosphatase: 51 U/L (ref 38–126)
Anion gap: 10 (ref 5–15)
BUN: 17 mg/dL (ref 8–23)
CO2: 23 mmol/L (ref 22–32)
Calcium: 8.3 mg/dL — ABNORMAL LOW (ref 8.9–10.3)
Chloride: 107 mmol/L (ref 98–111)
Creatinine, Ser: 1.18 mg/dL (ref 0.61–1.24)
GFR, Estimated: >60 mL/min (ref >60)
Glucose, Bld: 175 mg/dL — ABNORMAL HIGH (ref 70–99)
Potassium: 4.1 mmol/L (ref 3.5–5.1)
Sodium: 140 mmol/L (ref 135–145)
Total Bilirubin: 0.6 mg/dL (ref 0.3–1.2)
Total Protein: 5.6 g/dL — ABNORMAL LOW (ref 6.5–8.1)

## 2021-11-15 LAB — CBC
HCT: 32.8 % — ABNORMAL LOW (ref 39.0–52.0)
Hemoglobin: 10.8 g/dL — ABNORMAL LOW (ref 13.0–17.0)
MCH: 28.7 pg (ref 26.0–34.0)
MCHC: 32.9 g/dL (ref 30.0–36.0)
MCV: 87.2 fL (ref 80.0–100.0)
Platelets: 186 10*3/uL (ref 150–400)
RBC: 3.76 MIL/uL — ABNORMAL LOW (ref 4.22–5.81)
RDW: 16.5 % — ABNORMAL HIGH (ref 11.5–15.5)
WBC: 10.1 10*3/uL (ref 4.0–10.5)
nRBC: 0 % (ref 0.0–0.2)

## 2021-11-15 LAB — GLUCOSE, CAPILLARY
Glucose-Capillary: 126 mg/dL — ABNORMAL HIGH (ref 70–99)
Glucose-Capillary: 176 mg/dL — ABNORMAL HIGH (ref 70–99)
Glucose-Capillary: 194 mg/dL — ABNORMAL HIGH (ref 70–99)
Glucose-Capillary: 168 mg/dL — ABNORMAL HIGH (ref 70–99)
Glucose-Capillary: 147 mg/dL — ABNORMAL HIGH (ref 70–99)
Glucose-Capillary: 152 mg/dL — ABNORMAL HIGH (ref 70–99)
Glucose-Capillary: 146 mg/dL — ABNORMAL HIGH (ref 70–99)
Glucose-Capillary: 178 mg/dL — ABNORMAL HIGH (ref 70–99)
Glucose-Capillary: 173 mg/dL — ABNORMAL HIGH (ref 70–99)
Glucose-Capillary: 128 mg/dL — ABNORMAL HIGH (ref 70–99)
Glucose-Capillary: 132 mg/dL — ABNORMAL HIGH (ref 70–99)
Glucose-Capillary: 142 mg/dL — ABNORMAL HIGH (ref 70–99)
Glucose-Capillary: 126 mg/dL — ABNORMAL HIGH (ref 70–99)

## 2021-11-15 LAB — ABO/RH: ABO/RH(D): A POS

## 2021-11-15 SURGERY — WHIPPLE PROCEDURE
Anesthesia: General | Site: Abdomen

## 2021-11-15 MED ORDER — PHENYLEPHRINE HCL-NACL 20-0.9 MG/250ML-% IV SOLN
INTRAVENOUS | Status: DC | PRN
  Administered 2021-11-15: 40 ug/min via INTRAVENOUS
  Administered 2021-11-15: 20 ug/min via INTRAVENOUS

## 2021-11-15 MED ORDER — PROPOFOL 10 MG/ML IV BOLUS
INTRAVENOUS | Status: AC
  Filled 2021-11-15: qty 20

## 2021-11-15 MED ORDER — ESCITALOPRAM OXALATE 20 MG PO TABS
20.0000 mg | ORAL_TABLET | Freq: Every day | ORAL | Status: DC
  Administered 2021-11-16 – 2021-11-22 (×7): 20 mg via ORAL
  Filled 2021-11-15 (×7): qty 1

## 2021-11-15 MED ORDER — METRONIDAZOLE 500 MG/100ML IV SOLN
500.0000 mg | INTRAVENOUS | Status: AC
Start: 2021-11-15 — End: 2021-11-15
  Administered 2021-11-15: 500 mg via INTRAVENOUS
  Filled 2021-11-15: qty 100

## 2021-11-15 MED ORDER — CHLORHEXIDINE GLUCONATE 0.12 % MT SOLN: 15.0000 mL | Freq: Once | OROMUCOSAL | Status: AC

## 2021-11-15 MED ORDER — FENTANYL CITRATE (PF) 250 MCG/5ML IJ SOLN
INTRAMUSCULAR | Status: DC | PRN
  Administered 2021-11-15 (×3): 50 ug via INTRAVENOUS
  Administered 2021-11-15 (×2): 25 ug via INTRAVENOUS
  Administered 2021-11-15: 50 ug via INTRAVENOUS

## 2021-11-15 MED ORDER — INSULIN REGULAR(HUMAN) IN NACL 100-0.9 UT/100ML-% IV SOLN
INTRAVENOUS | Status: DC
  Administered 2021-11-15: 1.8 [IU]/h via INTRAVENOUS

## 2021-11-15 MED ORDER — LABETALOL HCL 5 MG/ML IV SOLN
10.0000 mg | INTRAVENOUS | Status: DC | PRN
  Administered 2021-11-15 – 2021-11-21 (×11): 10 mg via INTRAVENOUS
  Filled 2021-11-15 (×11): qty 4

## 2021-11-15 MED ORDER — OXYCODONE HCL 5 MG/5ML PO SOLN: 5.0000 mg | Freq: Once | ORAL | Status: DC | PRN

## 2021-11-15 MED ORDER — ONDANSETRON HCL 4 MG/2ML IJ SOLN
INTRAMUSCULAR | Status: DC | PRN
  Administered 2021-11-15: 4 mg via INTRAVENOUS

## 2021-11-15 MED ORDER — LIDOCAINE 2% (20 MG/ML) 5 ML SYRINGE
INTRAMUSCULAR | Status: DC | PRN
  Administered 2021-11-15: 50 mg via INTRAVENOUS

## 2021-11-15 MED ORDER — ENOXAPARIN SODIUM 40 MG/0.4ML IJ SOSY
40.0000 mg | PREFILLED_SYRINGE | INTRAMUSCULAR | Status: DC
  Administered 2021-11-16 – 2021-11-18 (×3): 40 mg via SUBCUTANEOUS
  Filled 2021-11-15 (×2): qty 0.4

## 2021-11-15 MED ORDER — CEFAZOLIN SODIUM 1 G IJ SOLR
INTRAMUSCULAR | Status: AC
  Filled 2021-11-15: qty 20

## 2021-11-15 MED ORDER — EPHEDRINE 5 MG/ML INJ
INTRAVENOUS | Status: AC
  Filled 2021-11-15: qty 5

## 2021-11-15 MED ORDER — KETAMINE HCL 50 MG/5ML IJ SOSY
PREFILLED_SYRINGE | INTRAMUSCULAR | Status: AC
  Filled 2021-11-15: qty 5

## 2021-11-15 MED ORDER — LEVOTHYROXINE SODIUM 112 MCG PO TABS
112.0000 ug | ORAL_TABLET | Freq: Every day | ORAL | Status: DC
  Administered 2021-11-17 – 2021-11-22 (×6): 112 ug via ORAL
  Filled 2021-11-15 (×8): qty 1

## 2021-11-15 MED ORDER — DIPHENHYDRAMINE HCL 50 MG/ML IJ SOLN: 12.5000 mg | Freq: Four times a day (QID) | INTRAMUSCULAR | Status: DC | PRN

## 2021-11-15 MED ORDER — ALBUMIN HUMAN 5 % IV SOLN: INTRAVENOUS | Status: DC | PRN

## 2021-11-15 MED ORDER — BUPIVACAINE LIPOSOME 1.3 % IJ SUSP
INTRAMUSCULAR | Status: DC | PRN
  Administered 2021-11-15: 10 mL via PERINEURAL

## 2021-11-15 MED ORDER — CHLORHEXIDINE GLUCONATE 0.12 % MT SOLN
OROMUCOSAL | Status: AC
  Administered 2021-11-15: 15 mL via OROMUCOSAL
  Filled 2021-11-15: qty 15

## 2021-11-15 MED ORDER — DEXAMETHASONE SODIUM PHOSPHATE 10 MG/ML IJ SOLN
INTRAMUSCULAR | Status: DC | PRN
  Administered 2021-11-15: 10 mg via INTRAVENOUS

## 2021-11-15 MED ORDER — LIDOCAINE 2% (20 MG/ML) 5 ML SYRINGE
INTRAMUSCULAR | Status: AC
  Filled 2021-11-15: qty 5

## 2021-11-15 MED ORDER — FENTANYL CITRATE (PF) 250 MCG/5ML IJ SOLN
INTRAMUSCULAR | Status: AC
Start: 2021-11-15 — End: ?
  Filled 2021-11-15: qty 5

## 2021-11-15 MED ORDER — AMISULPRIDE (ANTIEMETIC) 5 MG/2ML IV SOLN: 10.0000 mg | Freq: Once | INTRAVENOUS | Status: DC | PRN

## 2021-11-15 MED ORDER — INSULIN ASPART 100 UNIT/ML IJ SOLN: 0.0000 [IU] | INTRAMUSCULAR | Status: DC

## 2021-11-15 MED ORDER — 0.9 % SODIUM CHLORIDE (POUR BTL) OPTIME
TOPICAL | Status: DC | PRN
  Administered 2021-11-15 (×3): 1000 mL

## 2021-11-15 MED ORDER — SODIUM CHLORIDE 0.9% FLUSH: 9.0000 mL | INTRAVENOUS | Status: DC | PRN

## 2021-11-15 MED ORDER — PHENYLEPHRINE 40 MCG/ML (10ML) SYRINGE FOR IV PUSH (FOR BLOOD PRESSURE SUPPORT)
PREFILLED_SYRINGE | INTRAVENOUS | Status: AC
  Filled 2021-11-15: qty 40

## 2021-11-15 MED ORDER — SUGAMMADEX SODIUM 200 MG/2ML IV SOLN
INTRAVENOUS | Status: DC | PRN
  Administered 2021-11-15: 300 mg via INTRAVENOUS

## 2021-11-15 MED ORDER — DEXAMETHASONE SODIUM PHOSPHATE 10 MG/ML IJ SOLN
INTRAMUSCULAR | Status: AC
  Filled 2021-11-15: qty 1

## 2021-11-15 MED ORDER — OXYCODONE HCL 5 MG PO TABS: 5.0000 mg | ORAL_TABLET | Freq: Once | ORAL | Status: DC | PRN

## 2021-11-15 MED ORDER — ACETAMINOPHEN 10 MG/ML IV SOLN
INTRAVENOUS | Status: AC
  Filled 2021-11-15: qty 100

## 2021-11-15 MED ORDER — KETAMINE HCL 10 MG/ML IJ SOLN
INTRAMUSCULAR | Status: DC | PRN
  Administered 2021-11-15 (×2): 10 mg via INTRAVENOUS
  Administered 2021-11-15: 30 mg via INTRAVENOUS

## 2021-11-15 MED ORDER — NALOXONE HCL 0.4 MG/ML IJ SOLN: 0.4000 mg | INTRAMUSCULAR | Status: DC | PRN

## 2021-11-15 MED ORDER — HYDROMORPHONE 1 MG/ML IV SOLN
INTRAVENOUS | Status: DC
  Administered 2021-11-15: 0.2 mg via INTRAVENOUS
  Administered 2021-11-16: 0.4 mg via INTRAVENOUS
  Administered 2021-11-16: 1.2 mg via INTRAVENOUS
  Administered 2021-11-16: 1 mg via INTRAVENOUS
  Filled 2021-11-15: qty 30

## 2021-11-15 MED ORDER — ROPIVACAINE HCL 5 MG/ML IJ SOLN
INTRAMUSCULAR | Status: DC | PRN
  Administered 2021-11-15: 30 mL via PERINEURAL

## 2021-11-15 MED ORDER — ROCURONIUM BROMIDE 10 MG/ML (PF) SYRINGE
PREFILLED_SYRINGE | INTRAVENOUS | Status: AC
  Filled 2021-11-15: qty 20

## 2021-11-15 MED ORDER — EPHEDRINE SULFATE-NACL 50-0.9 MG/10ML-% IV SOSY
PREFILLED_SYRINGE | INTRAVENOUS | Status: DC | PRN
  Administered 2021-11-15: 5 mg via INTRAVENOUS
  Administered 2021-11-15: 10 mg via INTRAVENOUS

## 2021-11-15 MED ORDER — ENSURE PRE-SURGERY PO LIQD: 592.0000 mL | Freq: Once | ORAL | Status: DC

## 2021-11-15 MED ORDER — ONDANSETRON HCL 4 MG/2ML IJ SOLN
INTRAMUSCULAR | Status: AC
  Filled 2021-11-15: qty 2

## 2021-11-15 MED ORDER — ACETAMINOPHEN 10 MG/ML IV SOLN
INTRAVENOUS | Status: DC | PRN
  Administered 2021-11-15: 1000 mg via INTRAVENOUS

## 2021-11-15 MED ORDER — PANTOPRAZOLE SODIUM 40 MG IV SOLR
40.0000 mg | Freq: Every day | INTRAVENOUS | Status: DC
  Administered 2021-11-15 – 2021-11-17 (×3): 40 mg via INTRAVENOUS
  Filled 2021-11-15 (×3): qty 10

## 2021-11-15 MED ORDER — HEMOSTATIC AGENTS (NO CHARGE) OPTIME
TOPICAL | Status: DC | PRN
  Administered 2021-11-15: 1 via TOPICAL

## 2021-11-15 MED ORDER — ORAL CARE MOUTH RINSE: 15.0000 mL | Freq: Once | OROMUCOSAL | Status: AC

## 2021-11-15 MED ORDER — ONDANSETRON HCL 4 MG/2ML IJ SOLN: 4.0000 mg | Freq: Four times a day (QID) | INTRAMUSCULAR | Status: DC | PRN

## 2021-11-15 MED ORDER — LACTATED RINGERS IV SOLN: INTRAVENOUS | Status: DC | PRN

## 2021-11-15 MED ORDER — ENSURE PRE-SURGERY PO LIQD: 296.0000 mL | Freq: Once | ORAL | Status: DC

## 2021-11-15 MED ORDER — FENTANYL CITRATE (PF) 250 MCG/5ML IJ SOLN
INTRAMUSCULAR | Status: AC
  Filled 2021-11-15: qty 5

## 2021-11-15 MED ORDER — ROCURONIUM BROMIDE 10 MG/ML (PF) SYRINGE
PREFILLED_SYRINGE | INTRAVENOUS | Status: DC | PRN
  Administered 2021-11-15: 50 mg via INTRAVENOUS
  Administered 2021-11-15: 20 mg via INTRAVENOUS
  Administered 2021-11-15: 30 mg via INTRAVENOUS
  Administered 2021-11-15: 100 mg via INTRAVENOUS
  Administered 2021-11-15: 20 mg via INTRAVENOUS

## 2021-11-15 MED ORDER — PHENYLEPHRINE 40 MCG/ML (10ML) SYRINGE FOR IV PUSH (FOR BLOOD PRESSURE SUPPORT)
PREFILLED_SYRINGE | INTRAVENOUS | Status: DC | PRN
  Administered 2021-11-15: 40 ug via INTRAVENOUS
  Administered 2021-11-15: 80 ug via INTRAVENOUS
  Administered 2021-11-15: 120 ug via INTRAVENOUS
  Administered 2021-11-15 (×2): 80 ug via INTRAVENOUS
  Administered 2021-11-15: 40 ug via INTRAVENOUS
  Administered 2021-11-15: 160 ug via INTRAVENOUS
  Administered 2021-11-15: 80 ug via INTRAVENOUS
  Administered 2021-11-15: 120 ug via INTRAVENOUS
  Administered 2021-11-15: 40 ug via INTRAVENOUS
  Administered 2021-11-15: 120 ug via INTRAVENOUS
  Administered 2021-11-15 (×2): 80 ug via INTRAVENOUS
  Administered 2021-11-15: 40 ug via INTRAVENOUS

## 2021-11-15 MED ORDER — PROPOFOL 10 MG/ML IV BOLUS
INTRAVENOUS | Status: DC | PRN
  Administered 2021-11-15: 110 mg via INTRAVENOUS
  Administered 2021-11-15: 30 mg via INTRAVENOUS

## 2021-11-15 MED ORDER — LACTATED RINGERS IV SOLN: INTRAVENOUS | Status: DC

## 2021-11-15 MED ORDER — DEXTROSE 50 % IV SOLN: 0.0000 mL | INTRAVENOUS | Status: DC | PRN

## 2021-11-15 MED ORDER — FENTANYL CITRATE (PF) 100 MCG/2ML IJ SOLN: 25.0000 ug | INTRAMUSCULAR | Status: DC | PRN

## 2021-11-15 MED ORDER — INSULIN REGULAR(HUMAN) IN NACL 100-0.9 UT/100ML-% IV SOLN
INTRAVENOUS | Status: DC | PRN
  Administered 2021-11-15: 2.6 [IU]/h via INTRAVENOUS

## 2021-11-15 MED ORDER — DEXMEDETOMIDINE (PRECEDEX) IN NS 20 MCG/5ML (4 MCG/ML) IV SYRINGE
PREFILLED_SYRINGE | INTRAVENOUS | Status: DC | PRN
  Administered 2021-11-15: 8 ug via INTRAVENOUS

## 2021-11-15 MED ORDER — CEFAZOLIN SODIUM-DEXTROSE 2-4 GM/100ML-% IV SOLN
2.0000 g | INTRAVENOUS | Status: AC
  Administered 2021-11-15 (×2): 2 g via INTRAVENOUS
  Filled 2021-11-15: qty 100

## 2021-11-15 SURGICAL SUPPLY — 102 items
ADH SKN CLS APL DERMABOND .7 (GAUZE/BANDAGES/DRESSINGS) ×2
APL PRP STRL LF DISP 70% ISPRP (MISCELLANEOUS) ×1
BAG BILE T-TUBES STRL (MISCELLANEOUS) IMPLANT
BAG COUNTER SPONGE SURGICOUNT (BAG) ×3 IMPLANT
BAG DRAINAGE 600ML DEPOT (BAG) IMPLANT
BAG DRN 24 TWST VLV ADJ (BAG)
BAG DRN 9.5 2 ADJ BELT ADPR (MISCELLANEOUS)
BAG SPNG CNTER NS LX DISP (BAG) ×1
BIOPATCH RED 1 DISK 7.0 (GAUZE/BANDAGES/DRESSINGS) ×2 IMPLANT
BLADE CLIPPER SURG (BLADE) IMPLANT
BOOT SUTURE AID YELLOW STND (SUTURE) ×4 IMPLANT
CANISTER SUCT 3000ML PPV (MISCELLANEOUS) ×3 IMPLANT
CHLORAPREP W/TINT 26 (MISCELLANEOUS) ×3 IMPLANT
CLIP TI LARGE 6 (CLIP) ×3 IMPLANT
CLIP TI MEDIUM 24 (CLIP) ×2 IMPLANT
CLIP TI WIDE RED SMALL 24 (CLIP) ×2 IMPLANT
CNTNR URN SCR LID CUP LEK RST (MISCELLANEOUS) IMPLANT
CONT SPEC 4OZ STRL OR WHT (MISCELLANEOUS)
COUNTER NEEDLE 20 DBL MAG RED (NEEDLE) ×1 IMPLANT
COVER MAYO STAND STRL (DRAPES) IMPLANT
COVER SURGICAL LIGHT HANDLE (MISCELLANEOUS) ×3 IMPLANT
DERMABOND ADVANCED (GAUZE/BANDAGES/DRESSINGS) ×2
DERMABOND ADVANCED .7 DNX12 (GAUZE/BANDAGES/DRESSINGS) ×4 IMPLANT
DRAIN CHANNEL 19F RND (DRAIN) ×3 IMPLANT
DRAIN PENROSE 0.5X18 (DRAIN) ×2 IMPLANT
DRAPE INCISE IOBAN 66X45 STRL (DRAPES) ×2 IMPLANT
DRAPE UTILITY XL STRL (DRAPES) ×1 IMPLANT
DRAPE WARM FLUID 44X44 (DRAPES) ×3 IMPLANT
DRSG TEGADERM 4X4.75 (GAUZE/BANDAGES/DRESSINGS) ×3 IMPLANT
DRSG TELFA 3X8 NADH (GAUZE/BANDAGES/DRESSINGS) IMPLANT
ELECT BLADE 4.0 EZ CLEAN MEGAD (MISCELLANEOUS) ×2
ELECT BLADE 6.5 EXT (BLADE) ×3 IMPLANT
ELECT CAUTERY BLADE 6.4 (BLADE) ×2 IMPLANT
ELECT NDL BLADE 2-5/6 (NEEDLE) ×2 IMPLANT
ELECT NEEDLE BLADE 2-5/6 (NEEDLE) ×2 IMPLANT
ELECT PAD DSPR THERM+ ADLT (MISCELLANEOUS) ×2 IMPLANT
ELECT REM PT RETURN 9FT ADLT (ELECTROSURGICAL) ×2
ELECTRODE BLDE 4.0 EZ CLN MEGD (MISCELLANEOUS) ×1 IMPLANT
ELECTRODE REM PT RTRN 9FT ADLT (ELECTROSURGICAL) ×1 IMPLANT
EVACUATOR SILICONE 100CC (DRAIN) ×2 IMPLANT
GAUZE 4X4 16PLY ~~LOC~~+RFID DBL (SPONGE) IMPLANT
GAUZE SPONGE 4X4 12PLY STRL (GAUZE/BANDAGES/DRESSINGS) IMPLANT
GLOVE SURG POLY MICRO LF SZ5.5 (GLOVE) ×2 IMPLANT
GLOVE SURG SYN 5.5 (GLOVE) ×4 IMPLANT
GLOVE SURG SYN 5.5 PF PI (GLOVE) ×4 IMPLANT
GLOVE SURG UNDER POLY LF SZ6 (GLOVE) ×2 IMPLANT
GOWN STRL REUS W/ TWL LRG LVL3 (GOWN DISPOSABLE) ×10 IMPLANT
GOWN STRL REUS W/TWL LRG LVL3 (GOWN DISPOSABLE) ×8
HAND PENCIL TRP OPTION (MISCELLANEOUS) ×2 IMPLANT
HANDLE SUCTION POOLE (INSTRUMENTS) ×1 IMPLANT
HEMOSTAT SNOW SURGICEL 2X4 (HEMOSTASIS) ×1 IMPLANT
HEMOSTAT SURGICEL 2X14 (HEMOSTASIS) IMPLANT
KIT BASIN OR (CUSTOM PROCEDURE TRAY) ×3 IMPLANT
KIT TUBE JEJUNAL 16FR (CATHETERS) IMPLANT
KIT TURNOVER KIT B (KITS) ×2 IMPLANT
LOOP VESSEL MAXI BLUE (MISCELLANEOUS) ×3 IMPLANT
LOOP VESSEL MINI RED (MISCELLANEOUS) ×2 IMPLANT
MARKER SKIN DUAL TIP RULER LAB (MISCELLANEOUS) ×2 IMPLANT
NDL INSUFFLATION 14GA 120MM (NEEDLE) ×1 IMPLANT
NEEDLE INSUFFLATION 14GA 120MM (NEEDLE) ×2 IMPLANT
NS IRRIG 1000ML POUR BTL (IV SOLUTION) ×4 IMPLANT
PAD ARMBOARD 7.5X6 YLW CONV (MISCELLANEOUS) ×6 IMPLANT
PAD DRESSING TELFA 3X8 NADH (GAUZE/BANDAGES/DRESSINGS) IMPLANT
PENCIL SMOKE EVACUATOR (MISCELLANEOUS) ×2 IMPLANT
RELOAD PROXIMATE 75MM BLUE (ENDOMECHANICALS) ×4 IMPLANT
RELOAD STAPLE 75 3.8 BLU REG (ENDOMECHANICALS) ×1 IMPLANT
RETRACTOR WOUND ALXS 34CM XLRG (MISCELLANEOUS) IMPLANT
RTRCTR WOUND ALEXIS 34CM XLRG (MISCELLANEOUS) ×2
SET TUBE SMOKE EVAC HIGH FLOW (TUBING) ×3 IMPLANT
SHEARS FOC LG CVD HARMONIC 17C (MISCELLANEOUS) ×1 IMPLANT
SPONGE SURGIFOAM ABS GEL 100 (HEMOSTASIS) IMPLANT
SPONGE T-LAP 18X18 ~~LOC~~+RFID (SPONGE) ×6 IMPLANT
STAPLER PROXIMATE 75MM BLUE (STAPLE) ×2 IMPLANT
SUCTION POOLE HANDLE (INSTRUMENTS) ×2
SUT ETHILON 2 0 FS 18 (SUTURE) ×3 IMPLANT
SUT MNCRL AB 4-0 PS2 18 (SUTURE) ×3 IMPLANT
SUT PDS AB 1 TP1 96 (SUTURE) ×4 IMPLANT
SUT PDS AB 3-0 SH 27 (SUTURE) ×2 IMPLANT
SUT PDS AB 4-0 RB1 27 (SUTURE) ×23 IMPLANT
SUT PDS II 5-0 RB-2 VIOLET (SUTURE) ×13 IMPLANT
SUT PROLENE 3 0 SH 48 (SUTURE) ×3 IMPLANT
SUT PROLENE 4 0 RB 1 (SUTURE) ×16
SUT PROLENE 4-0 RB1 .5 CRCL 36 (SUTURE) ×2 IMPLANT
SUT SILK 2 0 TIES 10X30 (SUTURE) ×4 IMPLANT
SUT SILK 2 0SH CR/8 30 (SUTURE) ×2 IMPLANT
SUT SILK 3 0 TIES 10X30 (SUTURE) ×2 IMPLANT
SUT SILK 3 0SH CR/8 30 (SUTURE) ×5 IMPLANT
SUT VIC AB 2-0 CT1 27 (SUTURE)
SUT VIC AB 2-0 CT1 TAPERPNT 27 (SUTURE) IMPLANT
SUT VIC AB 2-0 SH 18 (SUTURE) IMPLANT
SUT VIC AB 3-0 MH 27 (SUTURE) ×7 IMPLANT
SUT VIC AB 3-0 SH 18 (SUTURE) ×2 IMPLANT
SUT VIC AB 3-0 SH 27 (SUTURE) ×4
SUT VIC AB 3-0 SH 27X BRD (SUTURE) ×2 IMPLANT
SUT VIC AB 3-0 SH 8-18 (SUTURE) ×3 IMPLANT
TOWEL GREEN STERILE (TOWEL DISPOSABLE) ×2 IMPLANT
TOWEL GREEN STERILE FF (TOWEL DISPOSABLE) ×2 IMPLANT
TRAY FOLEY MTR SLVR 14FR STAT (SET/KITS/TRAYS/PACK) ×2 IMPLANT
TRAY LAPAROSCOPIC MC (CUSTOM PROCEDURE TRAY) ×2 IMPLANT
TROCAR Z-THREAD FIOS 5X100MM (TROCAR) ×1 IMPLANT
TUBE FEEDING 8FR 16IN STR KANG (MISCELLANEOUS) IMPLANT
WARMER LAPAROSCOPE (MISCELLANEOUS) ×3 IMPLANT

## 2021-11-15 NOTE — Interval H&P Note (Signed)
History and Physical Interval Note: ? ?11/15/2021 ?7:17 AM ? ?Angel Celeste Sr.  has presented today for surgery, with the diagnosis of PANCREATIC CANCER.  The various methods of treatment have been discussed with the patient and family. After consideration of risks, benefits and other options for treatment, the patient has consented to  Procedure(s): ?WHIPPLE PROCEDURE, POSSIBLE PORTAL VEIN RESECTION (N/A) ?STAGING DIAGNOSTIC (N/A) as a surgical intervention.  The patient's history has been reviewed, patient examined, no change in status, stable for surgery.  I have reviewed the patient's chart and labs.  Questions were answered to the patient's satisfaction.  Admit to ICU postoperatively. ? ? ?Dwan Bolt ? ? ?

## 2021-11-15 NOTE — Transfer of Care (Signed)
Immediate Anesthesia Transfer of Care Note ? ?Patient: ANIS DEGIDIO Sr. ? ?Procedure(s) Performed: WHIPPLE PROCEDURE (Abdomen) ?LAPAROSCOPIC DIAGNOSTIC STAGING (Abdomen) ? ?Patient Location: PACU ? ?Anesthesia Type:General and Regional ? ?Level of Consciousness: drowsy ? ?Airway & Oxygen Therapy: Patient Spontanous Breathing and Patient connected to face mask oxygen ? ?Post-op Assessment: Report given to RN and Post -op Vital signs reviewed and stable ? ?Post vital signs: Reviewed and stable ? ?Last Vitals:  ?Vitals Value Taken Time  ?BP 150/82 11/15/21 1447  ?Temp    ?Pulse 79 11/15/21 1447  ?Resp 24 11/15/21 1447  ?SpO2 99 % 11/15/21 1447  ?Vitals shown include unvalidated device data. ? ?Last Pain:  ?Vitals:  ? 11/15/21 0601  ?TempSrc:   ?PainSc: 0-No pain  ?   ? ?Patients Stated Pain Goal: 2 (11/15/21 0601) ? ?Complications: No notable events documented. ?

## 2021-11-15 NOTE — Op Note (Signed)
Date: 11/15/21 ? ?Patient: Angel Keller. ?MRN: [id number] ? ?Preoperative Diagnosis: Pancreatic adenocarcinoma ?Postoperative Diagnosis: Same ? ?Procedure: ?Staging laparoscopy ?Classic-type pancreaticoduodenectomy (Whipple procedure) ? ?Surgeon: Michaelle Birks, MD ?Assistant: Louanna Raw, MD ? ?EBL: 700 mL ? ?Anesthesia: General endotracheal ? ?Specimens:  ?Gallbladder ?Common bile duct margin ?Pancreatic neck margin ?Whipple specimen ? 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 He has completed 3 cycles neoadjuvant chemotherapy with poor tolerance due to nausea/vomiting, however his tumor had a radiographic response to treatment. After an extensive discussion regarding the risks and benefits of surgical resection, he agreed to proceed with surgery. ? ?Findings: No evidence of metastatic disease within the abdomen or pelvis. Malignant mass in the head and neck of the pancreas, with abutment but not invasion of the anterolateral surface of the portal vein. SMA and common hepatic artery were free of disease. Firm, atrophic pancreas with a mildly dilated main duct (19m in diameter). ? ?Procedure details: Informed consent was obtained in the preoperative area prior to the procedure. The patient was brought to the operating room and placed on the table in the supine position. General anesthesia was induced and appropriate lines and drains were placed for intraoperative monitoring. Perioperative antibiotics were administered per SCIP guidelines. The abdomen was prepped and draped in the usual sterile fashion. A pre-procedure timeout was taken verifying patient identity, surgical site and procedure to be performed. ? ?An small infraumbilical skin incision was made, the umbilical stalk was grasped and elevated, and a Veress needle was inserted through the fascia. Intraperitoneal placement was  confirmed with the saline drop test and a 56mport was placed. The peritoneal cavity was inspected, including the liver, both hemidiaphragms, and the peritoneal surface throughout the abdomen. There was no evidence of metastatic disease. The port was removed and the abdomen was desufflated. ? ?An upper midline skin incision was made and the subcutaneous tissue was divided with cautery. The fascia was opened along the linea alba the peritoneal cavity was entered.  An Allexis wound protector and Bookwalter fixed retractor was placed. The duodenum was widely kocherized to expose the IVC and aorta. After kocherization, the SMA was palpated and was soft and free of tumor. Next the gastrocolic omentum was divided with harmonic shears to open the lesser sac. There was a firm mass in the head of the pancreas extending onto the neck. The proximal duodenum and pyloric channel were adherent to this mass. The common hepatic artery was soft with no evidence of tumor abutment or invasion. The inferior border of the pancreas was identified and dissected out to expose the SMV. The inferior border of the pancreas was gently elevated and a plane was bluntly created between the SMV and the posterior pancreas. There was no tumor adherence to the anterior surface of the portal vein.  ? ?The gallbladder was taken off the liver in a dome down fashion using cautery. The cystic artery was cauterized and the cystic duct was suture ligated and divided, and the gallbladder was passed off the field and sent for routine pathology. Next the portal dissection was started. The common bile duct was circumferentially dissected out, sweeping the adjacent lymph nodes down towards the duodenum with the planned specimen. The common bile duct was divided distal to the cystic duct insertion using cautery. The previously placed metal common bile duct stent was distal within the planned specimen. The distal bile duct  margin was sent for frozen section and  was negative for malignant cells. The gastroduodenal artery was then circumferentially dissected out, and on occlusion a pulse remained in the proper hepatic artery. The GDA was ligated with 2-0 silk ties and a clip and divided, however the clip and tie fell off the GDA stump after division. The GDA stump was then closed with a figure-of-eight 4-0 prolene suture and was hemostatic. The gastroepiploic vessels were then ligated with 2-0 silk ties on the distal greater curve of the stomach. The stomach was transected proximal to the pylorus with a 32m GIA blue load stapler.  Next the plane between the SMV/portal vein and neck of the pancreas was completed by gently bluntly dissecting the portal vein off the superior border of the pancreas. The neck of the pancreas was partially divided using cautery, and the posterior portion was transected sharply with a scalpel. The pancreatic neck margin was sent for frozen section and was negative for malignant cells. A mesenteric window was created at a point on the jejunum about 20cm distal to the ligament of Treitz, and the bowel was transected at this point using a 72mGIA blue load stapler. The jejunal mesentery was divided with Harmonic shears, and the ligament of Treitz was taken down with cautery. The proximal jejunum was then brought underneath the mesocolon into the RUQ.  ? ?After division of the pancreas it was apparent that the mass was adherent to the portal vein on the anteromedial surface, just proximal to the porto-splenic confluence. All inflow vessels to the portal  vein were circumferentially dissected out and encircled with vessels loops to obtain complete vascular control, including the splenic vein, IMV, middle colic vein, and SMV. The gastroepiploic vein was ligated near its insertion into the SMV using a 4-0 prolene suture ligature. The uncinate process of the pancreas was then carefully dissected off the retroperitoneum and SMA. The uncinate and adjacent  lymphatic tissue were taken off the SMA using harmonic shears. Larger branch vessels were clamped and tied with 2-0 silk ties. This left the specimen attached only via the portal vein. A penfield was used to gently develop the plane between the pancreas and the vein. There was a very small injury to the portal vein (<68m63mn diameter) which was repaired with a 4-0 prolene suture. The remainder of the mass was gently bluntly dissected off the vein, with no evidence of tumor invasion into the vessel. The specimen was removed and oriented, and sent for routine pathology. The vessel loops were all removed. The portal vein was inspected and was normal in caliber with no signs of significant narrowing. The SMA, common hepatic artery, and proper hepatic artery all had palpable pulses at the completion of the resection. ?  ?The abdomen was irrigated and no active bleeding was noted. For reconstruction the distal transected end of the jejunum was brought up through a window in the transverse mesocolon to the right of the middle colic vessels. The end of the jejunum was laid next to the transected neck of the pancreas. A pancreaticojejunostomy was created in [place] fashion using 3-0 vicryl transpancreatic sutures to the jejunum, and 5-0 PDS interrupted duct-to-mucosal sutures. An end-to-side choledochojejunostomy was then created using interrupted 4-0 PDS. The pancreaticobiliary limb of jejunum was then tacked to the transverse colon mesentery using 3-0 Vicryl sutures. An antecolic gastrojejunostomy was then created in 2 layers, approximately 40cm distal to the biliary anastomosis. A back layer of interrupted 3-0 silk sutures was placed between  the stapled end of the stomach and the jejunum. The gastric staple line was then removed with cautery and an enterotomy was created on the jejunum. An inner layer of running 3-0 PDS suture was placed in [place] fashion. An anterior row of 3-0 silk Lembert sutures was then placed to  complete the anastomosis. Confirmation of the nasogastric tube tip within the stomach was confirmed. The defect at the ligament of Treitz was closed with 3-0 Vicryl figure-of-eight sutures. Two 19-Fr JP drains

## 2021-11-15 NOTE — Anesthesia Procedure Notes (Signed)
Procedure Name: Intubation ?Date/Time: 11/15/2021 7:39 AM ?Performed by: Vonna Drafts, CRNA ?Pre-anesthesia Checklist: Patient identified, Emergency Drugs available, Suction available and Patient being monitored ?Patient Re-evaluated:Patient Re-evaluated prior to induction ?Oxygen Delivery Method: Circle system utilized ?Preoxygenation: Pre-oxygenation with 100% oxygen ?Induction Type: IV induction ?Ventilation: Mask ventilation without difficulty ?Laryngoscope Size: Mac and 4 ?Grade View: Grade I ?Tube type: Oral ?Tube size: 7.5 mm ?Number of attempts: 1 ?Airway Equipment and Method: Stylet and Oral airway ?Placement Confirmation: ETT inserted through vocal cords under direct vision, positive ETCO2 and breath sounds checked- equal and bilateral ?Secured at: 22 cm ?Tube secured with: Tape ?Dental Injury: Teeth and Oropharynx as per pre-operative assessment  ? ? ? ? ?

## 2021-11-15 NOTE — Anesthesia Procedure Notes (Signed)
Anesthesia Regional Block: TAP block  ? ?Pre-Anesthetic Checklist: , timeout performed,  Correct Patient, Correct Site, Correct Laterality,  Correct Procedure, Correct Position, site marked,  Risks and benefits discussed,  Surgical consent,  Pre-op evaluation,  At surgeon's request and post-op pain management ? ?Laterality: Left and Right ? ?Prep: Maximum Sterile Barrier Precautions used, chloraprep     ?  ?Needles:  ?Injection technique: Single-shot ? ?Needle Type: Echogenic Stimulator Needle   ? ? ?Needle Length: 9cm  ?Needle Gauge: 22  ? ? ? ?Additional Needles: ? ? ?Procedures:,,,, ultrasound used (permanent image in chart),,    ?Narrative:  ?Start time: 11/15/2021 2:20 PM ?End time: 11/15/2021 2:30 PM ?Injection made incrementally with aspirations every 5 mL. ? ?Performed by: Personally  ?Anesthesiologist: Pervis Hocking, DO ? ?Additional Notes: ?Monitors applied. No increased pain on injection. No increased resistance to injection. Injection made in 5cc increments. Good needle visualization. Patient tolerated procedure well.  ? ? ? ? ? ? ?

## 2021-11-15 NOTE — Anesthesia Procedure Notes (Signed)
Arterial Line Insertion ?Start/End4/10/2021 7:00 AM, 11/15/2021 7:05 AM ?Performed by: Pervis Hocking, DO, Vonna Drafts, CRNA, CRNA ? Patient location: Pre-op. ?Preanesthetic checklist: patient identified, IV checked, site marked, risks and benefits discussed, surgical consent, monitors and equipment checked, pre-op evaluation, timeout performed and anesthesia consent ?Lidocaine 1% used for infiltration ?Right, radial was placed ?Catheter size: 20 G ?Hand hygiene performed  and maximum sterile barriers used  ? ?Attempts: 1 ?Procedure performed without using ultrasound guided technique. ?Following insertion, dressing applied and Biopatch. ?Post procedure assessment: normal ? ?Patient tolerated the procedure well with no immediate complications. ? ? ?

## 2021-11-16 ENCOUNTER — Encounter (HOSPITAL_COMMUNITY): Payer: Self-pay | Admitting: Surgery

## 2021-11-16 LAB — COMPREHENSIVE METABOLIC PANEL
ALT: 37 U/L (ref 0–44)
AST: 64 U/L — ABNORMAL HIGH (ref 15–41)
Albumin: 2.6 g/dL — ABNORMAL LOW (ref 3.5–5.0)
Alkaline Phosphatase: 39 U/L (ref 38–126)
Anion gap: 7 (ref 5–15)
BUN: 16 mg/dL (ref 8–23)
CO2: 26 mmol/L (ref 22–32)
Calcium: 8.2 mg/dL — ABNORMAL LOW (ref 8.9–10.3)
Chloride: 107 mmol/L (ref 98–111)
Creatinine, Ser: 0.98 mg/dL (ref 0.61–1.24)
GFR, Estimated: >60 mL/min (ref >60)
Glucose, Bld: 136 mg/dL — ABNORMAL HIGH (ref 70–99)
Potassium: 4.5 mmol/L (ref 3.5–5.1)
Sodium: 140 mmol/L (ref 135–145)
Total Bilirubin: 0.9 mg/dL (ref 0.3–1.2)
Total Protein: 5.4 g/dL — ABNORMAL LOW (ref 6.5–8.1)

## 2021-11-16 LAB — GLUCOSE, CAPILLARY
Glucose-Capillary: 113 mg/dL — ABNORMAL HIGH (ref 70–99)
Glucose-Capillary: 115 mg/dL — ABNORMAL HIGH (ref 70–99)
Glucose-Capillary: 122 mg/dL — ABNORMAL HIGH (ref 70–99)
Glucose-Capillary: 128 mg/dL — ABNORMAL HIGH (ref 70–99)
Glucose-Capillary: 130 mg/dL — ABNORMAL HIGH (ref 70–99)
Glucose-Capillary: 136 mg/dL — ABNORMAL HIGH (ref 70–99)
Glucose-Capillary: 138 mg/dL — ABNORMAL HIGH (ref 70–99)
Glucose-Capillary: 142 mg/dL — ABNORMAL HIGH (ref 70–99)
Glucose-Capillary: 145 mg/dL — ABNORMAL HIGH (ref 70–99)
Glucose-Capillary: 149 mg/dL — ABNORMAL HIGH (ref 70–99)
Glucose-Capillary: 151 mg/dL — ABNORMAL HIGH (ref 70–99)
Glucose-Capillary: 159 mg/dL — ABNORMAL HIGH (ref 70–99)

## 2021-11-16 LAB — AMYLASE: Amylase: 55 U/L (ref 28–100)

## 2021-11-16 LAB — CBC
HCT: 30.8 % — ABNORMAL LOW (ref 39.0–52.0)
Hemoglobin: 9.9 g/dL — ABNORMAL LOW (ref 13.0–17.0)
MCH: 28 pg (ref 26.0–34.0)
MCHC: 32.1 g/dL (ref 30.0–36.0)
MCV: 87.3 fL (ref 80.0–100.0)
Platelets: 176 10*3/uL (ref 150–400)
RBC: 3.53 MIL/uL — ABNORMAL LOW (ref 4.22–5.81)
RDW: 16.5 % — ABNORMAL HIGH (ref 11.5–15.5)
WBC: 14.7 10*3/uL — ABNORMAL HIGH (ref 4.0–10.5)
nRBC: 0 % (ref 0.0–0.2)

## 2021-11-16 LAB — HEMOGLOBIN A1C
Hgb A1c MFr Bld: 5.8 % — ABNORMAL HIGH (ref 4.8–5.6)
Mean Plasma Glucose: 120 mg/dL

## 2021-11-16 MED ORDER — KETOROLAC TROMETHAMINE 15 MG/ML IJ SOLN
15.0000 mg | Freq: Three times a day (TID) | INTRAMUSCULAR | Status: AC
  Administered 2021-11-16 (×3): 15 mg via INTRAVENOUS
  Filled 2021-11-16 (×3): qty 1

## 2021-11-16 MED ORDER — INSULIN ASPART 100 UNIT/ML IJ SOLN
0.0000 [IU] | INTRAMUSCULAR | Status: DC
  Administered 2021-11-16 – 2021-11-18 (×6): 2 [IU] via SUBCUTANEOUS

## 2021-11-16 MED ORDER — CHLORHEXIDINE GLUCONATE CLOTH 2 % EX PADS
6.0000 | MEDICATED_PAD | Freq: Every day | CUTANEOUS | Status: DC
  Administered 2021-11-16 – 2021-11-21 (×3): 6 via TOPICAL

## 2021-11-16 MED ORDER — DEXTROSE-NACL 5-0.45 % IV SOLN
INTRAVENOUS | Status: DC
  Administered 2021-11-16 (×2): 75 mL/h via INTRAVENOUS

## 2021-11-16 NOTE — Evaluation (Signed)
Physical Therapy Evaluation ?Patient Details ?Name: Angel ZUCKERMAN Sr. ?MRN: [id number] ?DOB: [birth year] ?Today's Date: 11/16/2021 ? ?History of Present Illness ? 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 PMH includes HTN, pancreatic ca, aneurysm, bil RTC repair, depression.  ?Clinical Impression ? Patient presents with generalized weakness, pain, impaired balance, dizziness and impaired mobility s/p above. Pt lives at home with his daughter and reports being independent for ADLs and walking PTA. Pt loves to go to the gym and be active. Today, pt requires Max A for bed mobility, mod A for transfers and able to initiate gait with use of RW and Min A. Dizziness reported with sitting/standing. VSS on RA. LIkely will progress well with increased activity and be able to return home with support of daughter. Will follow acutely to maximize independence and mobility prior to return home. ?   ? ?Recommendations for follow up therapy are one component of a multi-disciplinary discharge planning process, led by the attending physician.  Recommendations may be updated based on patient status, additional functional criteria and insurance authorization. ? ?Follow Up Recommendations Home health PT ? ?  ?Assistance Recommended at Discharge Frequent or constant Supervision/Assistance  ?Patient can return home with the following ? A lot of help with walking and/or transfers;A little help with bathing/dressing/bathroom;Assist for transportation;Assistance with cooking/housework;Help with stairs or ramp for entrance ? ?  ?Equipment Recommendations Rolling walker (2 wheels)  ?Recommendations for Other Services ?    ?  ?Functional Status Assessment Patient has had a recent decline in their functional status and demonstrates the ability to make significant improvements in function in a reasonable and predictable amount of time.  ? ?  ?Precautions / Restrictions  Precautions ?Precautions: Fall;Other (comment) ?Precaution Comments: 2 JP drains, NG tube to suction, PCA pump, A-line ?Restrictions ?Weight Bearing Restrictions: No  ? ?  ? ?Mobility ? Bed Mobility ?Overal bed mobility: Needs Assistance ?Bed Mobility: Rolling, Sidelying to Sit ?Rolling: Min assist ?Sidelying to sit: Max assist, HOB elevated ?  ?  ?  ?General bed mobility comments: Assist with LEs and elevating trunk to get to EOB, cues for log roll technique.  Dizziness reported. VSS. ?  ? ?Transfers ?Overall transfer level: Needs assistance ?Equipment used: Rolling walker (2 wheels) ?Transfers: Sit to/from Stand ?Sit to Stand: Min assist ?  ?  ?  ?  ?  ?General transfer comment: Min A to power to standing with cues for hand placement/technique, posterior bias and lean, Cues needed for anterior weight shift. Stood from [employer], from chair x2. ?  ? ?Ambulation/Gait ?Ambulation/Gait assistance: Min assist ?Gait Distance (Feet): 6 Feet ?Assistive device: Rolling walker (2 wheels) ?Gait Pattern/deviations: Leaning posteriorly, Decreased step length - left, Decreased step length - right, Step-through pattern, Shuffle ?Gait velocity: decreased ?Gait velocity interpretation: <1.31 ft/sec, indicative of household ambulator ?  ?General Gait Details: Slow, mildly unsteady gait with posterior lean, assist for RW management. Min A for balance. ? ?Stairs ?  ?  ?  ?  ?  ? ?Wheelchair Mobility ?  ? ?Modified Rankin (Stroke Patients Only) ?  ? ?  ? ?Balance Overall balance assessment: Needs assistance ?Sitting-balance support: Feet supported, No upper extremity supported ?Sitting balance-Leahy Scale: Fair ?Sitting balance - Comments: supervision for safety. ?  ?Standing balance support: During functional activity, Reliant on assistive device for balance ?Standing balance-Leahy Scale: Poor ?Standing balance comment: Requires external support due to posterior lean and UE support on RW.  Dizziness reported. ?  ?  ?  ?  ?  ?  ?  ?  ?  ?   ?  ?   ? ? ? ?Pertinent Vitals/Pain Pain Assessment ?Pain Assessment: Faces ?Faces Pain Scale: Hurts little more ?Pain Location: abdomen- surgical site ?Pain Descriptors / Indicators: Operative site guarding, Sore, Discomfort ?Pain Intervention(s): Monitored during session, PCA encouraged, Repositioned  ? ? ?Home Living Family/patient expects to be discharged to:: Private residence ?Living Arrangements: Children (with daughter) ?Available Help at Discharge: Family;Available PRN/intermittently ?Type of Home: House ?Home Access: Stairs to enter ?Entrance Stairs-Rails: Left;Right ?Entrance Stairs-Number of Steps: 3 ?  ?Home Layout: One level ?Home Equipment: None ?   ?  ?Prior Function Prior Level of Function : Independent/Modified Independent ?  ?  ?  ?  ?  ?  ?Mobility Comments: Loves to go to the gym, work out from home, walk. Independent ?ADLs Comments: Independent, does not do any IADLs. No falls reported. ?  ? ? ?Hand Dominance  ? Dominant Hand: Right ? ?  ?Extremity/Trunk Assessment  ? Upper Extremity Assessment ?Upper Extremity Assessment: Defer to OT evaluation ?  ? ?Lower Extremity Assessment ?Lower Extremity Assessment: Generalized weakness (but functional) ?  ? ?Cervical / Trunk Assessment ?Cervical / Trunk Assessment: Kyphotic  ?Communication  ? Communication: No difficulties  ?Cognition Arousal/Alertness: Awake/alert ?Behavior During Therapy: [facility] for tasks assessed/performed ?Overall Cognitive Status: Within Functional Limits for tasks assessed ?  ?  ?  ?  ?  ?  ?  ?  ?  ?  ?  ?  ?  ?  ?  ?  ?General Comments: Highly motivated. ?  ?  ? ?  ?General Comments General comments (skin integrity, edema, etc.): VSS on RA. ? ?  ?Exercises    ? ?Assessment/Plan  ?  ?PT Assessment Patient needs continued PT services  ?PT Problem List Decreased strength;Decreased mobility;Pain;Decreased balance;Decreased knowledge of use of DME;Cardiopulmonary status limiting activity;Decreased skin integrity;Decreased knowledge of  precautions ? ?   ?  ?PT Treatment Interventions Therapeutic exercise;Gait training;Patient/family education;Therapeutic activities;Functional mobility training;Balance training;DME instruction   ? ?PT Goals (Current goals can be found in the Care Plan section)  ?Acute Rehab PT Goals ?Patient Stated Goal: get back to the gym ?PT Goal Formulation: With patient ?Time For Goal Achievement: 11/30/21 ?Potential to Achieve Goals: Good ? ?  ?Frequency Min 3X/week ?  ? ? ?Co-evaluation   ?  ?  ?  ?  ? ? ?  ?AM-PAC PT "6 Clicks" Mobility  ?Outcome Measure Help needed turning from your back to your side while in a flat bed without using bedrails?: A Little ?Help needed moving from lying on your back to sitting on the side of a flat bed without using bedrails?: A Lot ?Help needed moving to and from a bed to a chair (including a wheelchair)?: A Lot ?Help needed standing up from a chair using your arms (e.g., wheelchair or bedside chair)?: A Little ?Help needed to walk in hospital room?: A Little ?Help needed climbing 3-5 steps with a railing? : A Lot ?6 Click Score: 15 ? ?  ?End of Session   ?Activity Tolerance: Patient tolerated treatment well ?Patient left: in chair;with call bell/phone within reach;with chair alarm set;with nursing/sitter in room ?Nurse Communication: Mobility status ?PT Visit Diagnosis: Pain;Muscle weakness (generalized) (M62.81);Difficulty in walking, not elsewhere classified (R26.2);Unsteadiness on feet (R26.81) ?Pain - part of body:  (abdomen) ?  ? ?Time: 7858-8502 ?PT Time Calculation (min) (ACUTE ONLY):  26 min ? ? ?Charges:   PT Evaluation ?$PT Eval Moderate Complexity: 1 Mod ?PT Treatments ?$Therapeutic Activity: 8-22 mins ?  ?   ? ? ?Marisa Severin, PT, DPT ?Acute Rehabilitation Services ?Secure chat preferred ?Office [phone] ? ? ? ? ?[place] ?11/16/2021, 1:18 PM ? ?

## 2021-11-16 NOTE — Progress Notes (Signed)
Initial Nutrition Assessment ? ?DOCUMENTATION CODES:  ? ?Severe malnutrition in context of chronic illness ? ?INTERVENTION:  ? ?Ensure Enlive po TID once diet advanced, each supplement provides 350 kcal and 20 grams of protein. ? ?Encouraged pt to continue to follow-up with Outpatient Oncology RD ? ? ? ?NUTRITION DIAGNOSIS:  ? ?Severe Malnutrition related to chronic illness (pancreatic cancer) as evidenced by severe fat depletion, severe muscle depletion. ? ?GOAL:  ? ?Patient will meet greater than or equal to 90% of their needs ? ?MONITOR:  ? ?Diet advancement, PO intake, Labs, Weight trends ? ?REASON FOR ASSESSMENT:  ? ?Malnutrition Screening Tool ?  ? ?ASSESSMENT:  ? ?81 yo with hx of pancreatic cancer and admitted for whipple surgery. PMH includes HTN, pancreatic cancer, depression ? ?4/04 Classic-type pancreaticoduodenectomy (Whipple procedure), 2 19-Fr JP drains placed ? ?Pt reports he is uncomfortable, in pain.  ? ?NPO, NG-LIS. No diet advancement yet per MD as pt had some nausea this AM ? ?Pt is followed by Outpatient Oncology RD. Notes reviewed.  ?Pt reports he has been eating very well at home with regards to meals. Eating 3 meals per day ?Pt reports he has been taking 3 Ensure Plus supplements daily at home but recently ran out.  ?Pt reports he has not been take an MVI at home but has been taking Vit D and B 12 supplements ? ?Pt reports he lives with his Middle Daughter and she does the grocery shopping and cooking. His oldest daughter takes him to all of his appointments. ? ?Pt reports he has felt weak but feels like maybe his strength is improving. He used to go to the gym regularly but has not been able to do this recently ? ?Pt reports UBW around 170 pounds prior to cancer dx; current wt 155 pounds which is up from outpatient weights. Upon last outpatient RD assessment, pt reporting improved appetite and improved taste. It is possible that pt may have actually gained some weight prior to this  admission. ? ?Labs: reviewed ?Meds: ss novolog, D5-1/2 NS at 75 ml/hr ? ? ?NUTRITION - FOCUSED PHYSICAL EXAM: ? ?Flowsheet Row Most Recent Value  ?Orbital Region Severe depletion  ?Upper Arm Region Severe depletion  ?Thoracic and Lumbar Region Severe depletion  ?Buccal Region Moderate depletion  ?Temple Region Severe depletion  ?Clavicle Bone Region Severe depletion  ?Clavicle and Acromion Bone Region Severe depletion  ?Scapular Bone Region Severe depletion  ?Dorsal Hand Moderate depletion  ?Patellar Region Severe depletion  ?Anterior Thigh Region Severe depletion  ?Posterior Calf Region Severe depletion  ?Edema (RD Assessment) None  ? ?  ? ? ? ?Diet Order:   ?Diet Order   ? ?       ?  Diet NPO time specified Except for: Ice Chips  Diet effective now       ?  ? ?  ?  ? ?  ? ? ?EDUCATION NEEDS:  ? ?Education needs have been addressed ? ?Skin:  Skin Assessment: Reviewed RN Assessment ? ?Last BM:  PTA ? ?Height:  ? ?Ht Readings from Last 1 Encounters:  ?11/15/21 '5\' 10"'$  (1.778 m)  ? ? ?Weight:  ? ?Wt Readings from Last 1 Encounters:  ?11/16/21 70.5 kg  ? ? ?BMI:  Body mass index is 22.3 kg/m?. ? ?Estimated Nutritional Needs:  ? ?Kcal:  2200-2500 kcals ? ?Protein:  110-125 g ? ?Fluid:  >/= 2L ? ? ?Kerman Passey MS, RDN, LDN, CNSC ?Registered Dietitian III ?Clinical Nutrition ?RD Pager and On-Call  Pager Number Located in [place]  ? ?

## 2021-11-16 NOTE — Addendum Note (Signed)
Addendum  created 11/16/21 0634 by Pervis Hocking, DO  ? Clinical Note Signed  ?  ?

## 2021-11-16 NOTE — Anesthesia Postprocedure Evaluation (Addendum)
Anesthesia Post Note ? ?Patient: Angel BOWNS Sr. ? ?Procedure(s) Performed: WHIPPLE PROCEDURE (Abdomen) ?LAPAROSCOPIC DIAGNOSTIC STAGING (Abdomen) ? ?  ? ?Patient location during evaluation: PACU ?Anesthesia Type: General and Regional ?Level of consciousness: awake and alert, oriented and patient cooperative ?Pain management: pain level controlled ?Vital Signs Assessment: post-procedure vital signs reviewed and stable ?Respiratory status: spontaneous breathing, nonlabored ventilation and respiratory function stable ?Cardiovascular status: blood pressure returned to baseline and stable ?Postop Assessment: no apparent nausea or vomiting ?Anesthetic complications: no ? ? ?No notable events documented. ? ?Last Vitals:  ?Vitals:  ? 11/16/21 0500 11/16/21 0600  ?BP: 129/74 134/73  ?Pulse: 79 78  ?Resp: 19 19  ?Temp:    ?SpO2: 95% 95%  ?  ?Last Pain:  ?Vitals:  ? 11/16/21 0400  ?TempSrc:   ?PainSc: 6   ? ? ?  ?  ?  ?  ?  ?  ? ?Jarome Matin Saren Corkern ? ? ? ? ?

## 2021-11-16 NOTE — Progress Notes (Signed)
? ? ?  1 Day Post-Op  ?Subjective: ?No acute issues overnight. Afebrile, hemodynamically stable. Good UOP. Labs unremarkable this morning. Patient endorses some nausea and pain. ? ? ?Objective: ?Vital signs in last 24 hours: ?Temp:  [97.8 ?F (36.6 ?C)-99 ?F (37.2 ?C)] 98.3 ?F (36.8 ?C) (04/04 0400) ?Pulse Rate:  [60-79] 78 (04/04 0600) ?Resp:  [17-25] 19 (04/04 0600) ?BP: (126-155)/(70-101) 134/73 (04/04 0600) ?SpO2:  [94 %-99 %] 95 % (04/04 0600) ?Arterial Line BP: (150-183)/(59-77) 159/63 (04/04 0600) ?Weight:  [70.5 kg] 70.5 kg (04/04 0600) ?Last BM Date :  (PTA) ? ?Intake/Output from previous day: ?04/03 0701 - 04/04 0700 ?In: 3872 [I.V.:3302; IV Piggyback:570] ?Out: 2487 [Urine:1500; Drains:287; Blood:700] ?Intake/Output this shift: ?No intake/output data recorded. ? ?PE: ?General: resting comfortably, NAD ?Neuro: alert and oriented, no focal deficits ?HEENT: NG in place with bilious ?Resp: normal work of breathing ?CV: RRR ?Abdomen: soft, nondistended, appropriately tender. Midline incision is clean and dry with no induration, there is mild surrounding ecchymosis. JP x2 with serosanguinous drainage. ?Extremities: warm and well-perfused ?GU: foley draining clear yellow urine ? ? ?Lab Results:  ?Recent Labs  ?  11/15/21 ?1534 11/16/21 ?0325  ?WBC 10.1 14.7*  ?HGB 10.8* 9.9*  ?HCT 32.8* 30.8*  ?PLT 186 176  ? ?BMET ?Recent Labs  ?  11/15/21 ?1534 11/16/21 ?0325  ?NA 140 140  ?K 4.1 4.5  ?CL 107 107  ?CO2 23 26  ?GLUCOSE 175* 136*  ?BUN 17 16  ?CREATININE 1.18 0.98  ?CALCIUM 8.3* 8.2*  ? ?PT/INR ?No results for input(s): LABPROT, INR in the last 72 hours. ?CMP  ?   ?Component Value Date/Time  ? NA 140 11/16/2021 0325  ? NA 135 07/27/2021 1627  ? K 4.5 11/16/2021 0325  ? CL 107 11/16/2021 0325  ? CO2 26 11/16/2021 0325  ? GLUCOSE 136 (H) 11/16/2021 0325  ? BUN 16 11/16/2021 0325  ? BUN 21 07/27/2021 1627  ? CREATININE 0.98 11/16/2021 0325  ? CREATININE 0.98 10/20/2021 1230  ? CALCIUM 8.2 (L) 11/16/2021 0325  ? PROT  5.4 (L) 11/16/2021 0325  ? PROT 7.0 07/27/2021 1627  ? ALBUMIN 2.6 (L) 11/16/2021 0325  ? ALBUMIN 4.1 07/27/2021 1627  ? AST 64 (H) 11/16/2021 0325  ? AST 25 10/20/2021 1230  ? ALT 37 11/16/2021 0325  ? ALT 17 10/20/2021 1230  ? ALKPHOS 39 11/16/2021 0325  ? BILITOT 0.9 11/16/2021 0325  ? BILITOT 0.8 10/20/2021 1230  ? GFRNONAA >60 11/16/2021 0325  ? GFRNONAA >60 10/20/2021 1230  ? GFRAA 55 (L) 02/20/2019 2103  ? ?Lipase  ?   ?Component Value Date/Time  ? LIPASE 69 (H) 07/18/2021 1225  ? ? ? ? ? ? ? ?Assessment/Plan ?81 yo male with adenocarcinoma of the pancreatic head/neck, POD1 s/p Whipple. ?- Keep NG tube to LIS given nausea. NPO except ice chips. ?- Remove foley and a-line. ?- Transition off insulin gtt to sliding scale insulin q4h. ?- Maintenance IV fluids at 75 ml/hr ?- IV PPI daily ?- Mobilize today, PT ordered ?- VTE: lovenox, SCDs. Home eliquis for a-fib on hold. ?- Dispo: ICU  ? ? ? LOS: 1 day  ? ? ?Michaelle Birks, MD ?[facility] ?General, Hepatobiliary and Pancreatic Surgery ?11/16/21 7:39 AM ? ?

## 2021-11-17 LAB — COMPREHENSIVE METABOLIC PANEL
ALT: 31 U/L (ref 0–44)
AST: 53 U/L — ABNORMAL HIGH (ref 15–41)
Albumin: 2.4 g/dL — ABNORMAL LOW (ref 3.5–5.0)
Alkaline Phosphatase: 41 U/L (ref 38–126)
Anion gap: 6 (ref 5–15)
BUN: 18 mg/dL (ref 8–23)
CO2: 25 mmol/L (ref 22–32)
Calcium: 8.2 mg/dL — ABNORMAL LOW (ref 8.9–10.3)
Chloride: 107 mmol/L (ref 98–111)
Creatinine, Ser: 1.01 mg/dL (ref 0.61–1.24)
GFR, Estimated: >60 mL/min (ref >60)
Glucose, Bld: 108 mg/dL — ABNORMAL HIGH (ref 70–99)
Potassium: 4.6 mmol/L (ref 3.5–5.1)
Sodium: 138 mmol/L (ref 135–145)
Total Bilirubin: 0.5 mg/dL (ref 0.3–1.2)
Total Protein: 4.8 g/dL — ABNORMAL LOW (ref 6.5–8.1)

## 2021-11-17 LAB — GLUCOSE, CAPILLARY
Glucose-Capillary: 103 mg/dL — ABNORMAL HIGH (ref 70–99)
Glucose-Capillary: 107 mg/dL — ABNORMAL HIGH (ref 70–99)
Glucose-Capillary: 110 mg/dL — ABNORMAL HIGH (ref 70–99)
Glucose-Capillary: 114 mg/dL — ABNORMAL HIGH (ref 70–99)
Glucose-Capillary: 119 mg/dL — ABNORMAL HIGH (ref 70–99)
Glucose-Capillary: 141 mg/dL — ABNORMAL HIGH (ref 70–99)

## 2021-11-17 LAB — CBC
HCT: 28.1 % — ABNORMAL LOW (ref 39.0–52.0)
Hemoglobin: 9.2 g/dL — ABNORMAL LOW (ref 13.0–17.0)
MCH: 28.9 pg (ref 26.0–34.0)
MCHC: 32.7 g/dL (ref 30.0–36.0)
MCV: 88.4 fL (ref 80.0–100.0)
Platelets: 150 10*3/uL (ref 150–400)
RBC: 3.18 MIL/uL — ABNORMAL LOW (ref 4.22–5.81)
RDW: 16.8 % — ABNORMAL HIGH (ref 11.5–15.5)
WBC: 10.3 10*3/uL (ref 4.0–10.5)
nRBC: 0 % (ref 0.0–0.2)

## 2021-11-17 MED ORDER — KETOROLAC TROMETHAMINE 15 MG/ML IJ SOLN
15.0000 mg | Freq: Three times a day (TID) | INTRAMUSCULAR | Status: AC
  Administered 2021-11-17 (×2): 15 mg via INTRAVENOUS
  Filled 2021-11-17 (×2): qty 1

## 2021-11-17 MED ORDER — ACETAMINOPHEN 500 MG PO TABS
1000.0000 mg | ORAL_TABLET | Freq: Three times a day (TID) | ORAL | Status: DC
  Administered 2021-11-17 – 2021-11-22 (×12): 1000 mg via ORAL
  Filled 2021-11-17 (×15): qty 2

## 2021-11-17 NOTE — Progress Notes (Signed)
Physical Therapy Treatment ?Patient Details ?Name: Angel COSTANZO Sr. ?MRN: [id number] ?DOB: [birth year] ?Today's Date: 11/17/2021 ? ? 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 PMH includes HTN, pancreatic ca, aneurysm, bil RTC repair, depression. ? ?  ?PT Comments  ? ? Patient progressing well towards PT goals. Reports less pain in abdomen today. Session focused on progressive ambulation and gait training with use of RW for support. Pt with LOB posteriorly back onto bed in standing. Favors posterior lean but able to self correct with cues. Requires less assist with transfers today but repetition needed for technique/sequencing. No dizziness today. Motivated to get better and mobilize but continues to demonstrate weakness and balance deficits. Hoping pt continues to progress but if not, might need to consider SNF. Will follow. ?  ?Recommendations for follow up therapy are one component of a multi-disciplinary discharge planning process, led by the attending physician.  Recommendations may be updated based on patient status, additional functional criteria and insurance authorization. ? ?Follow Up Recommendations ? Home health PT ?  ?  ?Assistance Recommended at Discharge Frequent or constant Supervision/Assistance  ?Patient can return home with the following A lot of help with walking and/or transfers;A little help with bathing/dressing/bathroom;Assist for transportation;Assistance with cooking/housework;Help with stairs or ramp for entrance ?  ?Equipment Recommendations ? Rolling walker (2 wheels)  ?  ?Recommendations for Other Services   ? ? ?  ?Precautions / Restrictions Precautions ?Precautions: Fall;Other (comment) ?Precaution Comments: 2 JP drains, PCA pump ?Restrictions ?Weight Bearing Restrictions: No  ?  ? ?Mobility ? Bed Mobility ?Overal bed mobility: Needs Assistance ?Bed Mobility: Rolling, Sidelying to Sit ?Rolling:  Min assist ?Sidelying to sit: Min assist, HOB elevated ?  ?  ?  ?General bed mobility comments: Cues for log roll technique and to reach for rail, assist with LEs and to elevate trunk minimally. ?  ? ?Transfers ?Overall transfer level: Needs assistance ?Equipment used: Rolling walker (2 wheels) ?Transfers: Sit to/from Stand ?Sit to Stand: Min assist ?  ?  ?  ?  ?  ?General transfer comment: Min A to power to standing with cues for hand placement/technique, posterior bias and lean, able to self correct. LOB back onto bed.  Stood from [employer] x2., transferred to chair post ambulation. ?  ? ?Ambulation/Gait ?Ambulation/Gait assistance: Min assist ?Gait Distance (Feet): 35 Feet ?Assistive device: Rolling walker (2 wheels) ?Gait Pattern/deviations: Leaning posteriorly, Decreased step length - left, Decreased step length - right, Step-through pattern, Shuffle ?Gait velocity: decreased ?  ?  ?General Gait Details: Slow, mildly unsteady gait with posterior lean, assist for RW management. Min A for balance. VSS on RA. ? ? ?Stairs ?  ?  ?  ?  ?  ? ? ?Wheelchair Mobility ?  ? ?Modified Rankin (Stroke Patients Only) ?  ? ? ?  ?Balance Overall balance assessment: Needs assistance ?Sitting-balance support: Feet supported, No upper extremity supported ?Sitting balance-Leahy Scale: Fair ?Sitting balance - Comments: CLose min guard. posterior bias ?  ?Standing balance support: During functional activity ?Standing balance-Leahy Scale: Poor ?Standing balance comment: Requires external support due to posterior lean and UE support on RW. LOB onto bed. ?  ?  ?  ?  ?  ?  ?  ?  ?  ?  ?  ?  ? ?  ?Cognition Arousal/Alertness: Awake/alert ?Behavior During Therapy: [facility] for tasks assessed/performed ?Overall Cognitive Status: Impaired/Different from baseline ?Area of Impairment: Memory,  Following commands, Problem solving ?  ?  ?  ?  ?  ?  ?  ?  ?  ?  ?Memory: Decreased short-term memory, Decreased recall of precautions ?Following Commands: Follows  one step commands with increased time ?  ?  ?Problem Solving: Requires verbal cues ?General Comments: Repeating stories from yesterday, needs repetition to follow commands/precautions. ?  ?  ? ?  ?Exercises   ? ?  ?General Comments General comments (skin integrity, edema, etc.): VSS on RA. ?  ?  ? ?Pertinent Vitals/Pain Pain Assessment ?Pain Assessment: Faces ?Faces Pain Scale: Hurts a little bit ?Pain Location: abdomen- surgical site ?Pain Descriptors / Indicators: Operative site guarding, Sore, Discomfort ?Pain Intervention(s): Monitored during session, Repositioned, Limited activity within patient's tolerance, PCA encouraged  ? ? ?Home Living   ?  ?  ?  ?  ?  ?  ?  ?  ?  ?   ?  ?Prior Function    ?  ?  ?   ? ?PT Goals (current goals can now be found in the care plan section) Progress towards PT goals: Progressing toward goals ? ?  ?Frequency ? ? ? Min 3X/week ? ? ? ?  ?PT Plan Current plan remains appropriate  ? ? ?Co-evaluation   ?  ?  ?  ?  ? ?  ?AM-PAC PT "6 Clicks" Mobility   ?Outcome Measure ? Help needed turning from your back to your side while in a flat bed without using bedrails?: A Little ?Help needed moving from lying on your back to sitting on the side of a flat bed without using bedrails?: A Little ?Help needed moving to and from a bed to a chair (including a wheelchair)?: A Lot ?Help needed standing up from a chair using your arms (e.g., wheelchair or bedside chair)?: A Lot ?Help needed to walk in hospital room?: A Little ?Help needed climbing 3-5 steps with a railing? : A Lot ?6 Click Score: 15 ? ?  ?End of Session Equipment Utilized During Treatment: Gait belt ?Activity Tolerance: Patient tolerated treatment well ?Patient left: in chair;with call bell/phone within reach;with chair alarm set;with nursing/sitter in room ?Nurse Communication: Mobility status ?PT Visit Diagnosis: Pain;Muscle weakness (generalized) (M62.81);Difficulty in walking, not elsewhere classified (R26.2);Unsteadiness on feet  (R26.81) ?Pain - part of body:  (abdomen) ?  ? ? ?Time: 6789-3810 ?PT Time Calculation (min) (ACUTE ONLY): 20 min ? ?Charges:  $Gait Training: 8-22 mins          ?          ? ?Marisa Severin, PT, DPT ?Acute Rehabilitation Services ?Secure chat preferred ?Office [phone] ? ? ? ? ? ?[place] ?11/17/2021, 9:55 AM ? ?

## 2021-11-17 NOTE — Progress Notes (Signed)
? ? ?  2 Days Post-Op  ?Subjective: ?Patient had a brief episode of a-fib overnight with associated BP in 90s, remained asymptomatic. In NSR this morning. LFTs normal, afebrile. Reports difficulty with pain control. No nausea. ? ? ?Objective: ?Vital signs in last 24 hours: ?Temp:  [97.7 ?F (36.5 ?C)-98.3 ?F (36.8 ?C)] 98 ?F (36.7 ?C) (04/05 0400) ?Pulse Rate:  [67-100] 89 (04/05 0600) ?Resp:  [7-24] 20 (04/05 0600) ?BP: (93-166)/(51-98) 166/80 (04/05 0600) ?SpO2:  [93 %-99 %] 95 % (04/05 0600) ?Arterial Line BP: (137-169)/(53-65) 137/53 (04/04 1100) ?Weight:  [67.6 kg] 67.6 kg (04/05 0600) ?Last BM Date :  (PTA) ? ?Intake/Output from previous day: ?04/04 0701 - 04/05 0700 ?In: 1854.1 [I.V.:1704.1; NG/GT:150] ?Out: 8675 [Urine:1055; Emesis/NG output:270; Drains:122] ?Intake/Output this shift: ?No intake/output data recorded. ? ?PE: ?General: resting comfortably, NAD ?Neuro: alert and oriented, no focal deficits ?HEENT: NG in place with bilious drainage ?Resp: normal work of breathing ?CV: RRR ?Abdomen: soft, nondistended, appropriately tender. Midline incision is clean and dry with no induration, there is mild surrounding ecchymosis. JP x2 with serosanguinous drainage. ?Extremities: warm and well-perfused ?GU: external catheter ? ? ?Lab Results:  ?Recent Labs  ?  11/16/21 ?0325 11/17/21 ?4492  ?WBC 14.7* 10.3  ?HGB 9.9* 9.2*  ?HCT 30.8* 28.1*  ?PLT 176 150  ? ?BMET ?Recent Labs  ?  11/16/21 ?0325 11/17/21 ?0120  ?NA 140 138  ?K 4.5 4.6  ?CL 107 107  ?CO2 26 25  ?GLUCOSE 136* 108*  ?BUN 16 18  ?CREATININE 0.98 1.01  ?CALCIUM 8.2* 8.2*  ? ?PT/INR ?No results for input(s): LABPROT, INR in the last 72 hours. ?CMP  ?   ?Component Value Date/Time  ? NA 138 11/17/2021 0120  ? NA 135 07/27/2021 1627  ? K 4.6 11/17/2021 0120  ? CL 107 11/17/2021 0120  ? CO2 25 11/17/2021 0120  ? GLUCOSE 108 (H) 11/17/2021 0120  ? BUN 18 11/17/2021 0120  ? BUN 21 07/27/2021 1627  ? CREATININE 1.01 11/17/2021 0120  ? CREATININE 0.98 10/20/2021  1230  ? CALCIUM 8.2 (L) 11/17/2021 0120  ? PROT 4.8 (L) 11/17/2021 0120  ? PROT 7.0 07/27/2021 1627  ? ALBUMIN 2.4 (L) 11/17/2021 0120  ? ALBUMIN 4.1 07/27/2021 1627  ? AST 53 (H) 11/17/2021 0120  ? AST 25 10/20/2021 1230  ? ALT 31 11/17/2021 0120  ? ALT 17 10/20/2021 1230  ? ALKPHOS 41 11/17/2021 0120  ? BILITOT 0.5 11/17/2021 0120  ? BILITOT 0.8 10/20/2021 1230  ? GFRNONAA >60 11/17/2021 0120  ? GFRNONAA >60 10/20/2021 1230  ? GFRAA 55 (L) 02/20/2019 0100  ? ?Lipase  ?   ?Component Value Date/Time  ? LIPASE 69 (H) 07/18/2021 1225  ? ? ? ? ? ? ? ?Assessment/Plan ?81 yo male with adenocarcinoma of the pancreatic head/neck, POD2 s/p Whipple. ?- Remove NG tube, advance to clear liquid diet ?- Maintenance IV fluids at 50 ml/hr ?- PPI daily ?- Mobilize today, PT ordered ?- Pain control: continue dilaudid PCA, scheduled toradol, add scheduled tylenol. ?- VTE: lovenox, SCDs. Home eliquis for a-fib on hold, plan to transition to therapeutic lovenox on POD3. ?- Dispo: Transfer to progressive care. ? ? ? LOS: 2 days  ? ? ?Michaelle Birks, MD ?[facility] ?General, Hepatobiliary and Pancreatic Surgery ?11/17/21 7:48 AM ? ?

## 2021-11-18 LAB — COMPREHENSIVE METABOLIC PANEL
ALT: 36 U/L (ref 0–44)
AST: 44 U/L — ABNORMAL HIGH (ref 15–41)
Albumin: 2.3 g/dL — ABNORMAL LOW (ref 3.5–5.0)
Alkaline Phosphatase: 47 U/L (ref 38–126)
Anion gap: 4 — ABNORMAL LOW (ref 5–15)
BUN: 10 mg/dL (ref 8–23)
CO2: 30 mmol/L (ref 22–32)
Calcium: 8 mg/dL — ABNORMAL LOW (ref 8.9–10.3)
Chloride: 103 mmol/L (ref 98–111)
Creatinine, Ser: 0.94 mg/dL (ref 0.61–1.24)
GFR, Estimated: >60 mL/min (ref >60)
Glucose, Bld: 121 mg/dL — ABNORMAL HIGH (ref 70–99)
Potassium: 3.8 mmol/L (ref 3.5–5.1)
Sodium: 137 mmol/L (ref 135–145)
Total Bilirubin: 0.7 mg/dL (ref 0.3–1.2)
Total Protein: 5.1 g/dL — ABNORMAL LOW (ref 6.5–8.1)

## 2021-11-18 LAB — CBC
HCT: 27 % — ABNORMAL LOW (ref 39.0–52.0)
Hemoglobin: 8.6 g/dL — ABNORMAL LOW (ref 13.0–17.0)
MCH: 27.9 pg (ref 26.0–34.0)
MCHC: 31.9 g/dL (ref 30.0–36.0)
MCV: 87.7 fL (ref 80.0–100.0)
Platelets: 149 10*3/uL — ABNORMAL LOW (ref 150–400)
RBC: 3.08 MIL/uL — ABNORMAL LOW (ref 4.22–5.81)
RDW: 16 % — ABNORMAL HIGH (ref 11.5–15.5)
WBC: 8.8 10*3/uL (ref 4.0–10.5)
nRBC: 0 % (ref 0.0–0.2)

## 2021-11-18 LAB — GLUCOSE, CAPILLARY
Glucose-Capillary: 98 mg/dL (ref 70–99)
Glucose-Capillary: 134 mg/dL — ABNORMAL HIGH (ref 70–99)
Glucose-Capillary: 141 mg/dL — ABNORMAL HIGH (ref 70–99)
Glucose-Capillary: 85 mg/dL (ref 70–99)
Glucose-Capillary: 133 mg/dL — ABNORMAL HIGH (ref 70–99)

## 2021-11-18 LAB — AMYLASE: Amylase: 117 U/L — ABNORMAL HIGH (ref 28–100)

## 2021-11-18 LAB — SURGICAL PATHOLOGY

## 2021-11-18 MED ORDER — ENSURE ENLIVE PO LIQD
237.0000 mL | Freq: Three times a day (TID) | ORAL | Status: DC
  Administered 2021-11-18 – 2021-11-21 (×7): 237 mL via ORAL

## 2021-11-18 MED ORDER — OXYCODONE HCL 5 MG PO TABS
5.0000 mg | ORAL_TABLET | ORAL | Status: DC | PRN
  Administered 2021-11-19 – 2021-11-21 (×3): 5 mg via ORAL
  Filled 2021-11-18 (×3): qty 1

## 2021-11-18 MED ORDER — HYDROMORPHONE HCL 1 MG/ML IJ SOLN
0.5000 mg | INTRAMUSCULAR | Status: DC | PRN
  Administered 2021-11-18: 0.5 mg via INTRAVENOUS
  Filled 2021-11-18: qty 0.5

## 2021-11-18 MED ORDER — MIRTAZAPINE 15 MG PO TABS
7.5000 mg | ORAL_TABLET | Freq: Every day | ORAL | Status: DC
  Administered 2021-11-18 – 2021-11-21 (×4): 7.5 mg via ORAL
  Filled 2021-11-18 (×4): qty 1

## 2021-11-18 MED ORDER — PANTOPRAZOLE SODIUM 40 MG PO TBEC
40.0000 mg | DELAYED_RELEASE_TABLET | Freq: Every day | ORAL | Status: DC
  Administered 2021-11-18 – 2021-11-22 (×5): 40 mg via ORAL
  Filled 2021-11-18 (×5): qty 1

## 2021-11-18 NOTE — Progress Notes (Signed)
PCA pump turned off per order and PRN pains meds given. ?

## 2021-11-18 NOTE — Care Management Important Message (Signed)
Important Message ? ?Patient Details  ?Name: Angel BESKE Sr. ?MRN: [id number] ?Date of Birth: Feb 17, 1941 ? ? ?Medicare Important Message Given:  Yes ? ? ? ? ?[name] ?11/18/2021, 9:01 AM ?

## 2021-11-18 NOTE — Progress Notes (Signed)
Wasted '17mg'$  dilaudid in Chief Operating Officer, witnessed by Francee Gentile, RN. ?

## 2021-11-18 NOTE — Progress Notes (Addendum)
? ? ?  3 Days Post-Op  ?Subjective: ?No acute changes. Tolerating clear liquids, denies nausea/vomiting. Ambulated with PT yesterday, denies dizziness when out of bed. Good UOP. ? ? ?Objective: ?Vital signs in last 24 hours: ?Temp:  [98.1 ?F (36.7 ?C)-98.5 ?F (36.9 ?C)] 98.5 ?F (36.9 ?C) (04/06 0402) ?Pulse Rate:  [60-82] 78 (04/06 0402) ?Resp:  [12-23] 17 (04/06 0428) ?BP: (76-170)/(55-87) 165/87 (04/06 0402) ?SpO2:  [91 %-98 %] 93 % (04/06 0428) ?FiO2 (%):  [0 %] 0 % (04/06 0428) ?Last BM Date :  (PTA) ? ?Intake/Output from previous day: ?04/05 0701 - 04/06 0700 ?In: 0254 [P.O.:650; I.V.:1102] ?Out: 1025 [Urine:950; Drains:75] ?Intake/Output this shift: ?No intake/output data recorded. ? ?PE: ?General: resting comfortably, NAD ?Neuro: alert and oriented, no focal deficits ?HEENT: NG in place with bilious drainage ?Resp: normal work of breathing ?CV: RRR ?Abdomen: soft, nondistended, appropriately tender. Midline incision is clean and dry with no induration, there is mild surrounding ecchymosis. JP x2 with serosanguinous drainage. ?Extremities: warm and well-perfused ?GU: external catheter ? ? ?Lab Results:  ?Recent Labs  ?  11/17/21 ?2706 11/18/21 ?0606  ?WBC 10.3 8.8  ?HGB 9.2* 8.6*  ?HCT 28.1* 27.0*  ?PLT 150 149*  ? ?BMET ?Recent Labs  ?  11/17/21 ?0120 11/18/21 ?0606  ?NA 138 137  ?K 4.6 3.8  ?CL 107 103  ?CO2 25 30  ?GLUCOSE 108* 121*  ?BUN 18 10  ?CREATININE 1.01 0.94  ?CALCIUM 8.2* 8.0*  ? ?PT/INR ?No results for input(s): LABPROT, INR in the last 72 hours. ?CMP  ?   ?Component Value Date/Time  ? NA 137 11/18/2021 0606  ? NA 135 07/27/2021 1627  ? K 3.8 11/18/2021 0606  ? CL 103 11/18/2021 0606  ? CO2 30 11/18/2021 0606  ? GLUCOSE 121 (H) 11/18/2021 0606  ? BUN 10 11/18/2021 0606  ? BUN 21 07/27/2021 1627  ? CREATININE 0.94 11/18/2021 0606  ? CREATININE 0.98 10/20/2021 1230  ? CALCIUM 8.0 (L) 11/18/2021 0606  ? PROT 5.1 (L) 11/18/2021 0606  ? PROT 7.0 07/27/2021 1627  ? ALBUMIN 2.3 (L) 11/18/2021 0606  ?  ALBUMIN 4.1 07/27/2021 1627  ? AST 44 (H) 11/18/2021 0606  ? AST 25 10/20/2021 1230  ? ALT 36 11/18/2021 0606  ? ALT 17 10/20/2021 1230  ? ALKPHOS 47 11/18/2021 0606  ? BILITOT 0.7 11/18/2021 0606  ? BILITOT 0.8 10/20/2021 1230  ? GFRNONAA >60 11/18/2021 0606  ? GFRNONAA >60 10/20/2021 1230  ? GFRAA 55 (L) 02/20/2019 2376  ? ?Lipase  ?   ?Component Value Date/Time  ? LIPASE 69 (H) 07/18/2021 1225  ? ? ? ? ? ? ? 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 ?- Advance to full liquid diet, SLIV ?- PPI daily ?- Mobilize, PT following ?- Pain control: discontinue PCA and transition to prn oxycodone and dilaudid. Continue scheduled tylenol. ?- Sliding scale insulin q4h, insulin requirements have been very minimal. ?- Drain amylases pending ?- VTE: lovenox, SCDs. Home eliquis for a-fib on hold, will likely resume tomorrow if hgb stable. ?- Dispo: progressive care ? ? ? LOS: 3 days  ? ? ?Michaelle Birks, MD ?[facility] ?General, Hepatobiliary and Pancreatic Surgery ?11/18/21 7:37 AM ? ?

## 2021-11-19 LAB — GLUCOSE, CAPILLARY
Glucose-Capillary: 120 mg/dL — ABNORMAL HIGH (ref 70–99)
Glucose-Capillary: 123 mg/dL — ABNORMAL HIGH (ref 70–99)
Glucose-Capillary: 161 mg/dL — ABNORMAL HIGH (ref 70–99)
Glucose-Capillary: 76 mg/dL (ref 70–99)
Glucose-Capillary: 82 mg/dL (ref 70–99)
Glucose-Capillary: 89 mg/dL (ref 70–99)

## 2021-11-19 LAB — AMYLASE, BODY FLUID (OTHER)
Amylase, Body Fluid: 102 U/L
Amylase, Body Fluid: 96 U/L

## 2021-11-19 LAB — CBC
HCT: 27.4 % — ABNORMAL LOW (ref 39.0–52.0)
Hemoglobin: 9.1 g/dL — ABNORMAL LOW (ref 13.0–17.0)
MCH: 28.4 pg (ref 26.0–34.0)
MCHC: 33.2 g/dL (ref 30.0–36.0)
MCV: 85.6 fL (ref 80.0–100.0)
Platelets: 174 10*3/uL (ref 150–400)
RBC: 3.2 MIL/uL — ABNORMAL LOW (ref 4.22–5.81)
RDW: 15.5 % (ref 11.5–15.5)
WBC: 8.4 10*3/uL (ref 4.0–10.5)
nRBC: 0 % (ref 0.0–0.2)

## 2021-11-19 MED ORDER — ENOXAPARIN SODIUM 100 MG/ML IJ SOSY
100.0000 mg | PREFILLED_SYRINGE | INTRAMUSCULAR | Status: DC
  Administered 2021-11-19 – 2021-11-22 (×4): 100 mg via SUBCUTANEOUS
  Filled 2021-11-19 (×4): qty 1

## 2021-11-19 MED ORDER — INSULIN ASPART 100 UNIT/ML IJ SOLN: 0.0000 [IU] | Freq: Every day | INTRAMUSCULAR | Status: DC

## 2021-11-19 MED ORDER — DOCUSATE SODIUM 100 MG PO CAPS
100.0000 mg | ORAL_CAPSULE | Freq: Two times a day (BID) | ORAL | Status: DC
  Administered 2021-11-19 – 2021-11-22 (×7): 100 mg via ORAL
  Filled 2021-11-19 (×7): qty 1

## 2021-11-19 MED ORDER — INSULIN ASPART 100 UNIT/ML IJ SOLN: 0.0000 [IU] | Freq: Three times a day (TID) | INTRAMUSCULAR | Status: DC

## 2021-11-19 NOTE — Progress Notes (Signed)
?  Transition of Care (TOC) Screening Note ? ? ?Patient Details  ?Name: Angel LANSDOWNE Sr. ?Date of Birth: 09-03-1940 ? ? ?Transition of Care (TOC) CM/SW Contact:    ?Dahlia Client, Romeo Rabon, RN ?Phone Number: ?11/19/2021, 12:14 PM ? ? ? ?Transition of Care Department [facility]) has reviewed patient and note pt s/p Whipple. We will continue to monitor patient advancement through interdisciplinary progression rounds. If new patient transition needs arise, please place a TOC consult. ?  ?

## 2021-11-19 NOTE — Progress Notes (Signed)
ANTICOAGULATION CONSULT NOTE - Follow Up Consult ? ?Pharmacy Consult for Lovenox ?Indication: atrial fibrillation, on Eliquis PTA ? ?No Known Allergies ? ?Patient Measurements: ?Height: '5\' 10"'$  (177.8 cm) ?Weight: 67.6 kg (149 lb 0.5 oz) ?IBW/kg (Calculated) : 73 ? ?Vital Signs: ?Temp: 97.9 ?F (36.6 ?C) (04/07 0351) ?Temp Source: Oral (04/07 0351) ?BP: 143/75 (04/07 0351) ?Pulse Rate: 71 (04/07 0351) ? ?Labs: ?Recent Labs  ?  11/17/21 ?0120 11/17/21 ?5329 11/17/21 ?0653 11/18/21 ?0606 11/19/21 ?0135  ?HGB  --  9.2*   < > 8.6* 9.1*  ?HCT  --  28.1*  --  27.0* 27.4*  ?PLT  --  150  --  149* 174  ?CREATININE 1.01  --   --  0.94  --   ? < > = values in this interval not displayed.  ? ? ?Estimated Creatinine Clearance: 58.9 mL/min (by C-G formula based on SCr of 0.94 mg/dL). ? ?Assessment: ?81 year old male on apixaban prior to admission for Afib, now s/p Whipple procedure for pancreatic cancer.  To begin therapeutic Lovenox today as a a bridge back to [place]. ? ?Goal of Therapy:  ?Anti-Xa level 0.6-1 units/ml 4hrs after LMWH dose given ?Monitor platelets by anticoagulation protocol: Yes ?  ?Plan:  ?Lovenox 100 mg sq Q 24 hours (1.5 mg sq Q 24 hours) ?Follow up CBC ? ?Thank you ?Anette Guarneri, PharmD ? ?11/19/2021,8:05 AM ? ? ?

## 2021-11-19 NOTE — Progress Notes (Signed)
Mobility Specialist Progress Note ? ? 11/19/21 1147  ?Mobility  ?Activity Ambulated with assistance in room  ?Level of Assistance Minimal assist, patient does 75% or more  ?Assistive Device Front wheel walker  ?Distance Ambulated (ft) 30 ft  ?Activity Response Tolerated well  ?$Mobility charge 1 Mobility  ? ?Pre Mobility: 84 HR, 167/77 BP, 97% SpO2 ?During Mobility: 89 HR, BP, 88% SpO2 ?Post Mobility: 79 HR, BP, 144/85, 98% SpO2 ? ?Received pt in bed c/o incisional pain in abdominals and feeling generally weak but agreeable. Required inc time to transfer supine > EOB but only needing minG. MinA through out ambulation d/t dec balance and stability. X1 LOB but corrected by pt, returned to chair w/o fault, call bell in reach and chair alarm on. NT left in room. ? ?Angel Keller ?Mobility Specialist ?Phone Number [phone] ? ?

## 2021-11-19 NOTE — Progress Notes (Signed)
? ? ?  4 Days Post-Op  ?Subjective: ?Afebrile, vitals stable. Endorses some pain but using very little pain medication. Tolerating full liquids, denies nausea/vomiting. Passing flatus but no bowel movements yet. Was not out of bed yesterday. Drain amylases pending. ? ? ?Objective: ?Vital signs in last 24 hours: ?Temp:  [97.9 ?F (36.6 ?C)-98.7 ?F (37.1 ?C)] 97.9 ?F (36.6 ?C) (04/07 0351) ?Pulse Rate:  [68-79] 71 (04/07 0351) ?Resp:  [19-22] 19 (04/07 0351) ?BP: (136-164)/(72-93) 143/75 (04/07 0351) ?SpO2:  [94 %-96 %] 96 % (04/07 0351) ?Last BM Date :  (PTA) ? ?Intake/Output from previous day: ?04/06 0701 - 04/07 0700 ?In: 58 [P.O.:820] ?Out: 2110 [Urine:2025; Drains:85] ?Intake/Output this shift: ?No intake/output data recorded. ? ?PE: ?General: resting comfortably, NAD ?Neuro: alert and oriented, no focal deficits ?Resp: normal work of breathing ?CV: RRR ?Abdomen: soft, nondistended, minimally tender. Midline incision is clean and dry with no induration. JP x2 with serosanguinous drainage. ?Extremities: warm and well-perfused ?GU: external catheter ? ? ?Lab Results:  ?Recent Labs  ?  11/18/21 ?0606 11/19/21 ?7681  ?WBC 8.8 8.4  ?HGB 8.6* 9.1*  ?HCT 27.0* 27.4*  ?PLT 149* 174  ? ?BMET ?Recent Labs  ?  11/17/21 ?0120 11/18/21 ?0606  ?NA 138 137  ?K 4.6 3.8  ?CL 107 103  ?CO2 25 30  ?GLUCOSE 108* 121*  ?BUN 18 10  ?CREATININE 1.01 0.94  ?CALCIUM 8.2* 8.0*  ? ?PT/INR ?No results for input(s): LABPROT, INR in the last 72 hours. ?CMP  ?   ?Component Value Date/Time  ? NA 137 11/18/2021 0606  ? NA 135 07/27/2021 1627  ? K 3.8 11/18/2021 0606  ? CL 103 11/18/2021 0606  ? CO2 30 11/18/2021 0606  ? GLUCOSE 121 (H) 11/18/2021 0606  ? BUN 10 11/18/2021 0606  ? BUN 21 07/27/2021 1627  ? CREATININE 0.94 11/18/2021 0606  ? CREATININE 0.98 10/20/2021 1230  ? CALCIUM 8.0 (L) 11/18/2021 0606  ? PROT 5.1 (L) 11/18/2021 0606  ? PROT 7.0 07/27/2021 1627  ? ALBUMIN 2.3 (L) 11/18/2021 0606  ? ALBUMIN 4.1 07/27/2021 1627  ? AST 44 (H)  11/18/2021 0606  ? AST 25 10/20/2021 1230  ? ALT 36 11/18/2021 0606  ? ALT 17 10/20/2021 1230  ? ALKPHOS 47 11/18/2021 0606  ? BILITOT 0.7 11/18/2021 0606  ? BILITOT 0.8 10/20/2021 1230  ? GFRNONAA >60 11/18/2021 0606  ? GFRNONAA >60 10/20/2021 1230  ? GFRAA 55 (L) 02/20/2019 1572  ? ?Lipase  ?   ?Component Value Date/Time  ? LIPASE 69 (H) 07/18/2021 1225  ? ? ? ? ? ? ? 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 ?- Advance to soft diet ?- PPI daily ?- Mobilize, PT following. Patient must be out of bed daily. ?- Pain control: scheduled tylenol, prn oxycodone/dilaudid. ?- Sliding scale insulin ACHS, thus far insulin requirements have been minimal. ?- Drain amylases pending. Drains remain serosanguinous, will remove if amylases are low. ?- Bowel regimen ?- VTE: Hgb stable, begin therapeutic lovenox today for history of a-fib. ?- Dispo: progressive care ? ? ? LOS: 4 days  ? ? ?Michaelle Birks, MD ?[facility] ?General, Hepatobiliary and Pancreatic Surgery ?11/19/21 7:47 AM ? ?

## 2021-11-19 NOTE — Progress Notes (Signed)
Physical Therapy Treatment ?Patient Details ?Name: Angel BENEKE Sr. ?MRN: [id number] ?DOB: April 05, 1941 ?Today's Date: 11/19/2021 ? ? 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 PMH includes HTN, pancreatic ca, aneurysm, bil RTC repair, depression. ? ?  ?PT Comments  ? ? Pt received in recliner, agreeable to therapy session with encouragement and with good participation and improved tolerance for gait training and stair negotiation. Pt needing heavy minA with max cues for safe technique with transfers, gait and stairs and with decreased insight into deficits/benefits of mobility. He would benefit from frequent short bouts of mobility throughout the day including working with mobility tech and nursing staff in order to prepare for DC home. Pt continues to benefit from PT services to progress toward functional mobility goals.    ?Recommendations for follow up therapy are one component of a multi-disciplinary discharge planning process, led by the attending physician.  Recommendations may be updated based on patient status, additional functional criteria and insurance authorization. ? ?Follow Up Recommendations ? Home health PT ?  ?  ?Assistance Recommended at Discharge Frequent or constant Supervision/Assistance  ?Patient can return home with the following A lot of help with walking and/or transfers;A little help with bathing/dressing/bathroom;Assist for transportation;Assistance with cooking/housework;Help with stairs or ramp for entrance ?  ?Equipment Recommendations ? Rolling walker (2 wheels)  ?  ?Recommendations for Other Services   ? ? ?  ?Precautions / Restrictions Precautions ?Precautions: Fall;Other (comment) ?Precaution Comments: 2 JP drains, abdominal protection precs ?Restrictions ?Weight Bearing Restrictions: No  ?  ? ?Mobility ? Bed Mobility ?  ?   ?General bed mobility comments: received/remained in chair ?   ? ?Transfers ?Overall transfer level: Needs assistance ?Equipment used: Rolling walker (2 wheels) ?Transfers: Sit to/from Stand ?Sit to Stand: Min assist ?  ?  ?  ?  ?  ?General transfer comment: heavy minA for anterior weight shift and steadying upon standing; pt needed reminder to scoot forward in chair prior to standing; ?  ? ?Ambulation/Gait ?Ambulation/Gait assistance: Min assist, +2 safety/equipment ?Gait Distance (Feet): 75 Feet ?Assistive device: Rolling walker (2 wheels) ?Gait Pattern/deviations: Leaning posteriorly, Decreased step length - left, Decreased step length - right, Step-through pattern, Drifts right/left, Decreased stride length ?  ?  ?  ?General Gait Details: Slow, mildly unsteady gait with posterior lean, assist for RW management. Min A for balance. VSS on RA. Chair follow for safety but pt did not need to rest prior to return to room ? ? ?Stairs ?Stairs: Yes ?Stairs assistance: Min assist, +2 safety/equipment ?Stair Management: One rail Right, Forwards, Step to pattern ?Number of Stairs: 2 ?General stair comments: mod cues for step sequencing and minA for steadying assist with gait belt ? ? ? ?  ?Balance Overall balance assessment: Needs assistance ?Sitting-balance support: Feet supported, No upper extremity supported ?Sitting balance-Leahy Scale: Fair ?  ?  ?Standing balance support: During functional activity ?Standing balance-Leahy Scale: Poor ?Standing balance comment: Requires external support due to posterior lean and UE support on RW. ?  ?  ?  ?  ?  ?  ?  ?  ?  ?  ?  ?  ? ?  ?Cognition Arousal/Alertness: Awake/alert ?Behavior During Therapy: [facility] for tasks assessed/performed, Anxious ?Overall Cognitive Status: Impaired/Different from baseline ?Area of Impairment: Memory, Following commands, Problem solving ?  ?  ?  ?  ?  ?  ?  ?  ?  ?  ?  Memory: Decreased short-term memory, Decreased recall of precautions ?Following Commands: Follows one step commands with increased time ?  ?  ?Problem  Solving: Requires verbal cues ?General Comments: Pt needs repetition to follow commands/precautions. Pt with decreased insight into deficits and anxious to attempt new things including walking out of the room/stairs, but once agreeable pt appreciative of having performed. ?  ?  ? ?  ?Exercises Other Exercises ?Other Exercises: seated BLE AROM: ankle pumps, marching x10 reps ea ?Other Exercises: IS x 10 reps (~1000 mL); pushing chair on wheels ~54f with BLE on floor ? ?  ?General Comments General comments (skin integrity, edema, etc.): VSS on RA, BP WFL prior to mobility (given meds in AM) ?  ?  ? ?Pertinent Vitals/Pain Pain Assessment ?Pain Assessment: Faces ?Faces Pain Scale: Hurts a little bit ?Pain Location: abdomen- surgical site ?Pain Descriptors / Indicators: Operative site guarding, Sore, Discomfort ?Pain Intervention(s): Monitored during session, Repositioned, Other (comment) (discussed manual splinting)  ? ? ? ?PT Goals (current goals can now be found in the care plan section) Acute Rehab PT Goals ?Patient Stated Goal: get back to the gym ?PT Goal Formulation: With patient ?Time For Goal Achievement: 11/30/21 ?Progress towards PT goals: Progressing toward goals ? ?  ?Frequency ? ? ? Min 3X/week ? ? ? ?  ?PT Plan Current plan remains appropriate  ? ? ?   ?AM-PAC PT "6 Clicks" Mobility   ?Outcome Measure ? Help needed turning from your back to your side while in a flat bed without using bedrails?: A Little ?Help needed moving from lying on your back to sitting on the side of a flat bed without using bedrails?: A Little ?Help needed moving to and from a bed to a chair (including a wheelchair)?: A Little ?Help needed standing up from a chair using your arms (e.g., wheelchair or bedside chair)?: A Lot (mod cues) ?Help needed to walk in hospital room?: A Little ?Help needed climbing 3-5 steps with a railing? : A Lot (mod cues) ?6 Click Score: 16 ? ?  ?End of Session Equipment Utilized During Treatment: Gait  belt ?Activity Tolerance: Patient tolerated treatment well ?Patient left: in chair;with call bell/phone within reach;with chair alarm set;Other (comment) (pillow under LE for comfort) ?Nurse Communication: Mobility status ?PT Visit Diagnosis: Pain;Muscle weakness (generalized) (M62.81);Difficulty in walking, not elsewhere classified (R26.2);Unsteadiness on feet (R26.81) ?Pain - part of body:  (abdomen) ?  ? ? ?Time: [address]?PT Time Calculation (min) (ACUTE ONLY): 26 min ? ?Charges:  $Gait Training: 8-22 mins ?$Therapeutic Exercise: 8-22 mins          ?          ? ?Mariko Nowakowski P., PTA ?Acute Rehabilitation Services ?Secure Chat Preferred 9a-5:30pm ?Office: [phone] ? ? ?CKara PacerPoff ?11/19/2021, 2:31 PM ? ?

## 2021-11-20 LAB — CBC
HCT: 27.1 % — ABNORMAL LOW (ref 39.0–52.0)
Hemoglobin: 8.9 g/dL — ABNORMAL LOW (ref 13.0–17.0)
MCH: 27.9 pg (ref 26.0–34.0)
MCHC: 32.8 g/dL (ref 30.0–36.0)
MCV: 85 fL (ref 80.0–100.0)
Platelets: 201 10*3/uL (ref 150–400)
RBC: 3.19 MIL/uL — ABNORMAL LOW (ref 4.22–5.81)
RDW: 15.4 % (ref 11.5–15.5)
WBC: 8.5 10*3/uL (ref 4.0–10.5)
nRBC: 0 % (ref 0.0–0.2)

## 2021-11-20 LAB — BASIC METABOLIC PANEL
Anion gap: 6 (ref 5–15)
BUN: 15 mg/dL (ref 8–23)
CO2: 29 mmol/L (ref 22–32)
Calcium: 8.2 mg/dL — ABNORMAL LOW (ref 8.9–10.3)
Chloride: 103 mmol/L (ref 98–111)
Creatinine, Ser: 0.88 mg/dL (ref 0.61–1.24)
GFR, Estimated: >60 mL/min (ref >60)
Glucose, Bld: 101 mg/dL — ABNORMAL HIGH (ref 70–99)
Potassium: 3.6 mmol/L (ref 3.5–5.1)
Sodium: 138 mmol/L (ref 135–145)

## 2021-11-20 LAB — GLUCOSE, CAPILLARY: Glucose-Capillary: 94 mg/dL (ref 70–99)

## 2021-11-20 MED ORDER — POLYETHYLENE GLYCOL 3350 17 G PO PACK
17.0000 g | PACK | Freq: Every day | ORAL | Status: DC
  Administered 2021-11-20 – 2021-11-21 (×2): 17 g via ORAL
  Filled 2021-11-20 (×2): qty 1

## 2021-11-20 MED ORDER — ALUM & MAG HYDROXIDE-SIMETH 200-200-20 MG/5ML PO SUSP
30.0000 mL | ORAL | Status: DC | PRN
  Administered 2021-11-20 – 2021-11-22 (×3): 30 mL via ORAL
  Filled 2021-11-20 (×3): qty 30

## 2021-11-20 NOTE — Progress Notes (Signed)
Mobility Specialist Progress Note ? ? 11/20/21 1206  ?Mobility  ?Activity Stood at bedside ?(Stood at Chair* x10)  ?Level of Assistance Minimal assist, patient does 75% or more  ?Assistive Device Front wheel walker  ?Activity Response Tolerated well  ?$Mobility charge 1 Mobility  ? ?Pre Mobility: 64 HR, 100% SpO2 on RA ?During Mobility: 79 HR, 88% SpO2 on RA ?Post Mobility: 68 HR, 162/76 BP, 98% SpO2 on RA ? ?Received pt in chair c/o feeling weak but agreeable. MinA for x10 STS w/ x1 seated break for ~20mns. Pt fatigues quickly and presented w/ general BLE weakness but was able to finish task w/o fault. Left in chair w/ call bell in reach and pt having slight discomfort at drainage site.   ? ?JHolland Falling?Mobility Specialist ?Phone Number [phone]? ?

## 2021-11-20 NOTE — Progress Notes (Signed)
? ? ?  5 Days Post-Op  ?Subjective: ?Afebrile, vitals stable. Minimal drain output, drain amylases not elevated. Tolerating soft diet, but does report early satiety. Passing flatus, no BM yet. Pain well-controlled. Patient reports he would like to go home. ? ? ?Objective: ?Vital signs in last 24 hours: ?Temp:  [97.9 ?F (36.6 ?C)-98.6 ?F (37 ?C)] 98.6 ?F (37 ?C) (04/08 0516) ?Pulse Rate:  [63-74] 73 (04/08 0516) ?Resp:  [10-22] 20 (04/08 0516) ?BP: (144-191)/(74-95) 172/95 (04/08 0516) ?SpO2:  [94 %-97 %] 96 % (04/08 0516) ?Last BM Date :  (PTA) ? ?Intake/Output from previous day: ?04/07 0701 - 04/08 0700 ?In: 150 [P.O.:150] ?Out: 945 [Urine:925; Drains:20] ?Intake/Output this shift: ?Total I/O ?In: 150 [P.O.:150] ?Out: 320 [Urine:300; Drains:20] ? ?PE: ?General: resting comfortably, NAD ?Neuro: alert and oriented, no focal deficits ?Resp: normal work of breathing on room air ?CV: RRR ?Abdomen: soft, nondistended, nontender. Midline incision is clean and dry with no induration. JP x2 with minimal serous drainage ?Extremities: warm and well-perfused ?GU: external catheter ? ? ?Lab Results:  ?Recent Labs  ?  11/19/21 ?0135 11/20/21 ?0156  ?WBC 8.4 8.5  ?HGB 9.1* 8.9*  ?HCT 27.4* 27.1*  ?PLT 174 201  ? ?BMET ?Recent Labs  ?  11/18/21 ?0606 11/20/21 ?0156  ?NA 137 138  ?K 3.8 3.6  ?CL 103 103  ?CO2 30 29  ?GLUCOSE 121* 101*  ?BUN 10 15  ?CREATININE 0.94 0.88  ?CALCIUM 8.0* 8.2*  ? ?PT/INR ?No results for input(s): LABPROT, INR in the last 72 hours. ?CMP  ?   ?Component Value Date/Time  ? NA 138 11/20/2021 0156  ? NA 135 07/27/2021 1627  ? K 3.6 11/20/2021 0156  ? CL 103 11/20/2021 0156  ? CO2 29 11/20/2021 0156  ? GLUCOSE 101 (H) 11/20/2021 0156  ? BUN 15 11/20/2021 0156  ? BUN 21 07/27/2021 1627  ? CREATININE 0.88 11/20/2021 0156  ? CREATININE 0.98 10/20/2021 1230  ? CALCIUM 8.2 (L) 11/20/2021 0156  ? PROT 5.1 (L) 11/18/2021 0606  ? PROT 7.0 07/27/2021 1627  ? ALBUMIN 2.3 (L) 11/18/2021 0606  ? ALBUMIN 4.1 07/27/2021  1627  ? AST 44 (H) 11/18/2021 0606  ? AST 25 10/20/2021 1230  ? ALT 36 11/18/2021 0606  ? ALT 17 10/20/2021 1230  ? ALKPHOS 47 11/18/2021 0606  ? BILITOT 0.7 11/18/2021 0606  ? BILITOT 0.8 10/20/2021 1230  ? GFRNONAA >60 11/20/2021 0156  ? GFRNONAA >60 10/20/2021 1230  ? GFRAA 55 (L) 02/20/2019 5027  ? ?Lipase  ?   ?Component Value Date/Time  ? LIPASE 69 (H) 07/18/2021 1225  ? ? ? ? ? ? ? 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 ?- Soft diet, instructed patient to consume small meals throughout the day rather than 3 large meals ?- PPI daily ?- Mobilize, PT following and recommending HHPT. ?- Pain control: scheduled tylenol, prn oxycodone/dilaudid. ?- Very minimal insulin requirements, discontinue sliding scale and POC glucose checks ?- Both drains removed on rounds this morning ?- Bowel regimen ?- VTE: therapeutic lovenox, hgb remains stable. ?- Dispo: med-surg status. Anticipate discharge home in next 1-2 days. ? ? ? LOS: 5 days  ? ? ?Michaelle Birks, MD ?[facility] ?General, Hepatobiliary and Pancreatic Surgery ?11/20/21 6:55 AM ? ?

## 2021-11-20 NOTE — Plan of Care (Signed)

## 2021-11-21 MED ORDER — HYDROCHLOROTHIAZIDE 12.5 MG PO TABS
12.5000 mg | ORAL_TABLET | Freq: Every day | ORAL | Status: DC
  Administered 2021-11-21: 12.5 mg via ORAL
  Filled 2021-11-21 (×2): qty 1

## 2021-11-21 MED ORDER — POLYETHYLENE GLYCOL 3350 17 G PO PACK
17.0000 g | PACK | Freq: Two times a day (BID) | ORAL | Status: DC
  Administered 2021-11-21 – 2021-11-22 (×2): 17 g via ORAL
  Filled 2021-11-21 (×2): qty 1

## 2021-11-21 NOTE — TOC Transition Note (Signed)
Transition of Care (TOC) - CM/SW Discharge Note ?Marvetta Gibbons Therapist, sports, BSN ?Transitions of Care ?Unit 4E- RN Case Manager ?See Treatment Team for direct phone #  ? ? ?Patient Details  ?Name: Angel MERKEL Sr. ?MRN: [id number] ?Date of Birth: [birth year] ? ?Transition of Care (TOC) CM/SW Contact:  ?Dahlia Client, Romeo Rabon, RN ?Phone Number: ?11/21/2021, 1:06 PM ? ? ?Clinical Narrative:    ?Pt s/p Whipple, from home. HH and DME orders have been placed.  ?CM in to speak with pt at bedside regarding transition needs. List provided to pt for [facility] choice Per CMS guidelines from medicare.gov website with star ratings (copy placed in shadow chart) - pt has selected [facility] as his first choice- if unable to accept pt voiced that he does not have any further preference and will defer to [facility] to [place] on his behalf.  ? ?Discussed DME- pt reports he does not have walker at home and agreeable to using any provider - CM will use in house provider to arrange DME- RW to be delivered to room prior to discharge. [facility] has been contact for delivery.  ? ?Address, phone # and PCP all confirmed with pt in epic.  ? ?Call made to [facility]- spoke with Alwyn Ren- HHPT referral has been accepted- will plan to do start of care visit on Tues. 4/11 due to Easter holiday.  ? ? ?Final next level of care: [place] ?Barriers to Discharge: No Barriers Identified ? ? ?Patient Goals and CMS Choice ?Patient states their goals for this hospitalization and ongoing recovery are:: return home ?CMS Medicare.gov Compare Post Acute Care list provided to:: Patient ?Choice offered to / list presented to : Patient ? ?Discharge Placement ?  ?           ? Home w/ HH ?  ?  ?  ? ?Discharge Plan and Services ?  ?Discharge Planning Services: CM Consult ?Post Acute Care Choice: Durable Medical Equipment, Home Health          ?DME Arranged: Walker rolling ?DME Agency: [facility] ?Date DME Agency Contacted: 11/21/21 ?Time DME Agency Contacted:  [phone] ?Representative spoke with at DME Agency: Delana Meyer ?HH Arranged: PT ?[place] Agency: [place] ?Date HH Agency Contacted: 11/21/21 ?Time [place]: [phone] ?Representative spoke with at [place]: Alwyn Ren ? ?Social Determinants of Health (SDOH) Interventions ?  ? ? ?Readmission Risk Interventions ? ?  11/21/2021  ?  1:06 PM  ?Readmission Risk Prevention Plan  ?Transportation Screening Complete  ?Medication Review Press photographer) Complete  ?PCP or Specialist appointment within 3-5 days of discharge Complete  ?[place] or Home Care Consult Complete  ?Palliative Care Screening Not Applicable  ?[place] Not Applicable  ? ? ? ? ? ?

## 2021-11-21 NOTE — Progress Notes (Signed)
Mobility Specialist Progress Note  ? ? 11/21/21 1046  ?Mobility  ?Activity Ambulated with assistance in hallway  ?Level of Assistance Minimal assist, patient does 75% or more  ?Assistive Device Front wheel walker  ?Distance Ambulated (ft) 230 ft  ?Activity Response Tolerated fair  ?$Mobility charge 1 Mobility  ? ?Pre-Mobility: 63 HR, 106/63 BP ?Post-Mobility: 64 HR, 121/76 BP ? ?Pt received in chair and agreeable. C/o lightheadedness after first attempt to stand. Took a seated rest break for a few minutes, encouraged pursed lip breathing. Returned to chair with call bell in reach saying he was still slightly lightheaded.  ? ?Hildred Alamin ?Mobility Specialist  ?  ?

## 2021-11-21 NOTE — Progress Notes (Signed)
Patient was complaining of really bad indigestion after eating a bite of his supper. Per pt he just took one bite of mashed potato, refused any ensure. Abdomen distended and hypoactive, patient showed to his epigastric region and said it hurts and burns there and burping a lot, passing gas. On call Physician Dr. Kieth Brightly called and notified, received order for Maalox q4 hr prn. Please see MAR for medication administration.  ?

## 2021-11-21 NOTE — Progress Notes (Signed)
? ? ?6 Days Post-Op  ?Subjective: ?CC: ?Patient report - Doing well. Stable abdominal pain, mostly around his incision. Tolerating diet. Ate 1/2 plate spaghetti for dinner yesterday. No n/v. Doesn't feel bloated or distended. Passing flatus. No BM. Voiding. Got oob to chair yesterday.  ? ?Note reviewed - Apparently had bad indigestion after supper last night and became distended. Was given Maalox w/ improvement last night. Also had a dose this am. Not taking prn pain meds. Requiring prn labetalol x3 yesterday and x1 this am.  ? ?Objective: ?Vital signs in last 24 hours: ?Temp:  [97.7 ?F (36.5 ?C)-98.6 ?F (37 ?C)] 98.6 ?F (37 ?C) (04/09 0715) ?Pulse Rate:  [63-81] 79 (04/09 0715) ?Resp:  [15-20] 15 (04/09 0715) ?BP: (139-183)/(73-95) 180/85 (04/09 0715) ?SpO2:  [96 %-99 %] 96 % (04/09 0715) ?Last BM Date :  (PTA) ? ?Intake/Output from previous day: ?04/08 0701 - 04/09 0700 ?In: 150 [P.O.:150] ?Out: 1025 [Urine:1025] ?Intake/Output this shift: ?No intake/output data recorded. ? ?PE: ?General: resting comfortably in chair, NAD ?Neuro: alert and oriented, no focal deficits ?Resp: normal work of breathing on room air. CTA b/l.  ?CV: RRR ?Abdomen: soft, mild distension, NT, +BS. Midline incision is clean and dry with no induration. Prior drain site with dressing in place, c/d/i ?Extremities: warm and well-perfused. No LE edema.  ?GU: external catheter with transparent straw colored urine in cannister.  ? ?Lab Results:  ?Recent Labs  ?  11/19/21 ?0135 11/20/21 ?0156  ?WBC 8.4 8.5  ?HGB 9.1* 8.9*  ?HCT 27.4* 27.1*  ?PLT 174 201  ? ?BMET ?Recent Labs  ?  11/20/21 ?0156  ?NA 138  ?K 3.6  ?CL 103  ?CO2 29  ?GLUCOSE 101*  ?BUN 15  ?CREATININE 0.88  ?CALCIUM 8.2*  ? ?PT/INR ?No results for input(s): LABPROT, INR in the last 72 hours. ?CMP  ?   ?Component Value Date/Time  ? NA 138 11/20/2021 0156  ? NA 135 07/27/2021 1627  ? K 3.6 11/20/2021 0156  ? CL 103 11/20/2021 0156  ? CO2 29 11/20/2021 0156  ? GLUCOSE 101 (H) 11/20/2021  0156  ? BUN 15 11/20/2021 0156  ? BUN 21 07/27/2021 1627  ? CREATININE 0.88 11/20/2021 0156  ? CREATININE 0.98 10/20/2021 1230  ? CALCIUM 8.2 (L) 11/20/2021 0156  ? PROT 5.1 (L) 11/18/2021 0606  ? PROT 7.0 07/27/2021 1627  ? ALBUMIN 2.3 (L) 11/18/2021 0606  ? ALBUMIN 4.1 07/27/2021 1627  ? AST 44 (H) 11/18/2021 0606  ? AST 25 10/20/2021 1230  ? ALT 36 11/18/2021 0606  ? ALT 17 10/20/2021 1230  ? ALKPHOS 47 11/18/2021 0606  ? BILITOT 0.7 11/18/2021 0606  ? BILITOT 0.8 10/20/2021 1230  ? GFRNONAA >60 11/20/2021 0156  ? GFRNONAA >60 10/20/2021 1230  ? GFRAA 55 (L) 02/20/2019 6269  ? ?Lipase  ?   ?Component Value Date/Time  ? LIPASE 69 (H) 07/18/2021 1225  ? ? ?Studies/Results: ?No results found. ? ?Anti-infectives: ?Anti-infectives (From admission, onward)  ? ? Start     Dose/Rate Route Frequency Ordered Stop  ? 11/15/21 0615  ceFAZolin (ANCEF) IVPB 2g/100 mL premix       ?See Hyperspace for full Linked Orders Report.  ? 2 g ?200 mL/hr over 30 Minutes Intravenous On call to O.R. 11/15/21 0553 11/15/21 1332  ? 11/15/21 0615  metroNIDAZOLE (FLAGYL) IVPB 500 mg       ?See Hyperspace for full Linked Orders Report.  ? 500 mg ?100 mL/hr over 60 Minutes Intravenous On  call to O.R. 11/15/21 1224 11/15/21 0826  ? ?  ? ? ? 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 ?- Soft diet, encourage small meals throughout the day rather than 3 large meals ?- PPI daily ?- Mobilize, PT following and recommending HHPT. ?- Pain control: scheduled tylenol, prn oxycodone/dilaudid. ?- Off SSI and POC glucose checks reassuring ?- Both drains removed 4/8 ?- Inc bowel regimen ?- Will discuss w/ MD about reordering some of patients home meds (BP) ?- VTE: therapeutic lovenox, hgb remains stable. ?- Dispo: med-surg status. Hopefully home in next 1-2 days. ? ? LOS: 6 days  ? ? ?Jillyn Ledger , PA-C ?[facility] ?11/21/2021, 10:44 AM ?Please see [place] for pager number during day hours  7:00am-4:30pm ? ?

## 2021-11-22 LAB — BASIC METABOLIC PANEL
Anion gap: 8 (ref 5–15)
BUN: 18 mg/dL (ref 8–23)
CO2: 29 mmol/L (ref 22–32)
Calcium: 8.2 mg/dL — ABNORMAL LOW (ref 8.9–10.3)
Chloride: 99 mmol/L (ref 98–111)
Creatinine, Ser: 0.9 mg/dL (ref 0.61–1.24)
GFR, Estimated: >60 mL/min (ref >60)
Glucose, Bld: 108 mg/dL — ABNORMAL HIGH (ref 70–99)
Potassium: 3.5 mmol/L (ref 3.5–5.1)
Sodium: 136 mmol/L (ref 135–145)

## 2021-11-22 LAB — CBC
HCT: 29.1 % — ABNORMAL LOW (ref 39.0–52.0)
Hemoglobin: 9.6 g/dL — ABNORMAL LOW (ref 13.0–17.0)
MCH: 27.9 pg (ref 26.0–34.0)
MCHC: 33 g/dL (ref 30.0–36.0)
MCV: 84.6 fL (ref 80.0–100.0)
Platelets: 253 10*3/uL (ref 150–400)
RBC: 3.44 MIL/uL — ABNORMAL LOW (ref 4.22–5.81)
RDW: 15.4 % (ref 11.5–15.5)
WBC: 10.6 10*3/uL — ABNORMAL HIGH (ref 4.0–10.5)
nRBC: 0 % (ref 0.0–0.2)

## 2021-11-22 MED ORDER — BISACODYL 10 MG RE SUPP: 10.0000 mg | Freq: Once | RECTAL | Status: DC

## 2021-11-22 MED ORDER — POLYETHYLENE GLYCOL 3350 17 G PO PACK: 17.0000 g | PACK | Freq: Every day | ORAL | 0 refills | Status: DC | PRN

## 2021-11-22 MED ORDER — DOCUSATE SODIUM 100 MG PO CAPS: 100.0000 mg | ORAL_CAPSULE | Freq: Two times a day (BID) | ORAL | 0 refills | Status: DC

## 2021-11-22 MED ORDER — OXYCODONE HCL 5 MG PO TABS: 5.0000 mg | ORAL_TABLET | Freq: Four times a day (QID) | ORAL | 0 refills | Status: AC | PRN

## 2021-11-22 MED ORDER — ACETAMINOPHEN 500 MG PO TABS: 500.0000 mg | ORAL_TABLET | Freq: Four times a day (QID) | ORAL | 0 refills | Status: DC | PRN

## 2021-11-22 NOTE — Discharge Instructions (Addendum)
[facility] DISCHARGE INSTRUCTIONS: WHIPPLE PROCEDURE ? ?Activity ?No heavy lifting greater than 15 pounds for 6 weeks after surgery. ?Ok to shower, but do not bathe or submerge incision underwater. ?Do not drive while taking narcotic pain medication. ?Be sure to walk around your home at least 3 times daily. This will help avoid blood clots and will improve your strength. ? ?Wound Care ?Your incision is covered with skin glue called Dermabond. This will peel off on its own over time. ?You may shower and allow warm soapy water to run over your incisions. Gently pat dry. ?Do not submerge your incision underwater. ?Monitor your incision for any new redness, tenderness, or drainage. ? ?Medications ?You may take Tylenol 4 times daily for pain. ?If you have more severe pain that is not covered by Tylenol, you may take oxycodone up to every 6 hours as needed. Please reserve this medication only for the most severe pain. ? ? ?Diet ?You may notice that you feel full early after meals, or have occasional nausea or decreased appetite. This is normal following a Whipple and will improve with time. ?It is better to consume 5-6 small frequent meals throughout the day rather than 3 large meals. ?Avoid drinking a lot of carbonated beverages, as these can worsen symptoms of nausea and reflux. ?If you are having frequent vomiting after meals or are unable to keep down any food or drink, please call the office right away. ? ?When to Call us: ?Fever greater than 100.5 ?New redness, drainage, or swelling at incision site ?Severe pain, nausea, or vomiting ?Excessive vomiting or inability to keep down any liquids ?Low blood sugar or very high blood sugar (greater than 300) ?Jaundice (yellowing of the whites of the eyes or skin) ? ?Follow-up ?You have an appointment scheduled with Dr. Zenia Resides in 1 week. This will be at the [facility] office at [address], [place], [place], [place]. Please arrive at least 15  minutes prior to your scheduled appointment time. ? ?For questions or concerns, please call the office at [phone].  ?

## 2021-11-22 NOTE — Progress Notes (Signed)
Physical Therapy Treatment ?Patient Details ?Name: Angel RIMEL Sr. ?MRN: [id number] ?DOB: 09/18/1940 ?Today's Date: 11/22/2021 ? ? 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 PMH includes HTN, pancreatic ca, aneurysm, bil RTC repair, depression. ? ?  ?PT Comments  ? ? Pt required min assist bed mobility, min assist transfers, and min guard assist ambulation 30' with RW. Gait distance limited by need for BM. Pt independent with toilet hygiene. Pt in recliner at end of session. ?   ?Recommendations for follow up therapy are one component of a multi-disciplinary discharge planning process, led by the attending physician.  Recommendations may be updated based on patient status, additional functional criteria and insurance authorization. ? ?Follow Up Recommendations ? Home health PT ?  ?  ?Assistance Recommended at Discharge Frequent or constant Supervision/Assistance  ?Patient can return home with the following A lot of help with walking and/or transfers;A little help with bathing/dressing/bathroom;Assist for transportation;Assistance with cooking/housework;Help with stairs or ramp for entrance ?  ?Equipment Recommendations ? Rolling walker (2 wheels)  ?  ?Recommendations for Other Services   ? ? ?  ?Precautions / Restrictions Precautions ?Precautions: Fall  ?  ? ?Mobility ? Bed Mobility ?Overal bed mobility: Needs Assistance ?Bed Mobility: Sidelying to Sit ?  ?Sidelying to sit: Min assist, HOB elevated ?  ?  ?  ?General bed mobility comments: +rail, increased time ?  ? ?Transfers ?Overall transfer level: Needs assistance ?Equipment used: Rolling walker (2 wheels) ?Transfers: Sit to/from Stand ?Sit to Stand: Min assist ?  ?  ?  ?  ?  ?General transfer comment: assist to power up, increased time ?  ? ?Ambulation/Gait ?Ambulation/Gait assistance: Min guard ?Gait Distance (Feet): 30 Feet ?Assistive device: Rolling walker (2  wheels) ?Gait Pattern/deviations: Step-through pattern, Decreased stride length ?Gait velocity: decreased ?Gait velocity interpretation: <1.31 ft/sec, indicative of household ambulator ?  ?General Gait Details: Hallway amb initiated but pt felt the urge for a BM requiring return to room. ? ? ?Stairs ?  ?  ?  ?  ?  ? ? ?Wheelchair Mobility ?  ? ?Modified Rankin (Stroke Patients Only) ?  ? ? ?  ?Balance Overall balance assessment: Needs assistance ?Sitting-balance support: Feet supported, No upper extremity supported ?Sitting balance-Leahy Scale: Good ?  ?  ?Standing balance support: Bilateral upper extremity supported, During functional activity, Reliant on assistive device for balance ?Standing balance-Leahy Scale: Poor ?  ?  ?  ?  ?  ?  ?  ?  ?  ?  ?  ?  ?  ? ?  ?Cognition Arousal/Alertness: Awake/alert ?Behavior During Therapy: [facility] for tasks assessed/performed, Anxious ?Overall Cognitive Status: Within Functional Limits for tasks assessed ?  ?  ?  ?  ?  ?  ?  ?  ?  ?  ?  ?  ?  ?  ?  ?  ?  ?  ?  ? ?  ?Exercises   ? ?  ?General Comments General comments (skin integrity, edema, etc.): VSS on RA ?  ?  ? ?Pertinent Vitals/Pain Pain Assessment ?Pain Assessment: Faces ?Faces Pain Scale: Hurts a little bit ?Pain Location: abdomen- surgical site ?Pain Descriptors / Indicators: Operative site guarding, Sore, Discomfort ?Pain Intervention(s): Monitored during session, Repositioned  ? ? ?Home Living   ?  ?  ?  ?  ?  ?  ?  ?  ?  ?   ?  ?  Prior Function    ?  ?  ?   ? ?PT Goals (current goals can now be found in the care plan section) Acute Rehab PT Goals ?Patient Stated Goal: get back to the gym ?Progress towards PT goals: Progressing toward goals ? ?  ?Frequency ? ? ? Min 3X/week ? ? ? ?  ?PT Plan Current plan remains appropriate  ? ? ?Co-evaluation   ?  ?  ?  ?  ? ?  ?AM-PAC PT "6 Clicks" Mobility   ?Outcome Measure ? Help needed turning from your back to your side while in a flat bed without using bedrails?: A Little ?Help  needed moving from lying on your back to sitting on the side of a flat bed without using bedrails?: A Little ?Help needed moving to and from a bed to a chair (including a wheelchair)?: A Little ?Help needed standing up from a chair using your arms (e.g., wheelchair or bedside chair)?: A Little ?Help needed to walk in hospital room?: A Little ?Help needed climbing 3-5 steps with a railing? : A Lot ?6 Click Score: 17 ? ?  ?End of Session Equipment Utilized During Treatment: Gait belt ?Activity Tolerance: Patient tolerated treatment well ?Patient left: in chair;with call bell/phone within reach ?Nurse Communication: Mobility status ?PT Visit Diagnosis: Pain;Muscle weakness (generalized) (M62.81);Difficulty in walking, not elsewhere classified (R26.2);Unsteadiness on feet (R26.81) ?  ? ? ?Time: 3419-6222 ?PT Time Calculation (min) (ACUTE ONLY): 36 min ? ?Charges:  $Gait Training: 8-22 mins ?$Therapeutic Activity: 8-22 mins          ?          ? ?Lorrin Goodell, PT  ?Office # [phone] ?Pager [phone] ? ? ? ?Lorriane Shire ?11/22/2021, 11:36 AM ? ?

## 2021-11-22 NOTE — Care Management Important Message (Signed)
Important Message ? ?Patient Details  ?Name: Angel WANDEL Sr. ?MRN: [id number] ?Date of Birth: 11/22/1940 ? ? ?Medicare Important Message Given:  Yes ? ? ? ? ?[name] ?11/22/2021, 8:59 AM ?

## 2021-11-22 NOTE — Discharge Summary (Signed)
Physician Discharge Summary  ? ?Patient ID: ?Angel Keller ?[id number] ?81 y.o. ?Jan 06, 1941 ? ?Admit date: 11/15/2021 ? ?Discharge date and time: 11/22/2021 12:42 PM  ? ?Admitting Physician: Dwan Bolt, MD  ? ?Discharge Physician: Michaelle Birks, MD ? ?Admission Diagnoses: Pancreatic cancer ([place]) [C25.9] ?Pancreatic adenocarcinoma ([place]) [C25.9] ? ?Discharge Diagnoses: Pancreatic adenocarcinoma ? ?Admission Condition: stable ? ?Discharged Condition: stable ? ?Indication for Admission: Mr. Angel Keller is an 81 yo male who was diagnosed with adenocarcinoma of the pancreatic head several months ago after presenting with obstructive jaundice.  There was concern for abutment of the portal vein on his initial scans, and he underwent treatment with neoadjuvant Gemcitabine/Abraxane.  Treatment was stopped after 3 cycles due to intolerance with nausea and vomiting.  He has significantly improved since stopping chemotherapy and has started gaining weight.  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  After an extensive discussion of the risks and benefits of a Whipple procedure, the patient agreed to proceed with surgery. ? ?Hospital Course: The patient was taken to the operating room on 11/15/2021 for a staging laparoscopy and open Whipple procedure.  For further details of the procedure please see separately dictated operative note.  Two drains were left intraoperatively.  Postoperatively he was admitted to the ICU in stable condition.  POD 1 his Foley was removed and he was able to void spontaneously.  He was transitioned off an insulin gtt to sliding scale NovoLog.  On POD 2 his NG tube was removed and he was advanced to a clear liquid diet, which he tolerated with no nausea or vomiting.  He was transferred to a progressive care unit.  Over the next few days his diet was slowly advanced to full liquids and then to a soft diet on POD 4.  He was  transition to oral pain medications.  Amylase levels in both of his surgical drains were low on POD 3, drain character remained serosanguineous, and the drains were removed on POD 5.  He was started on therapeutic Lovenox for his history of A-fib on POD 5, and subsequently had no signs of bleeding.  The patient worked with physical therapy and was cleared for discharge with home health PT.  He had return of bowel function and was able to tolerate oral intake.  Insulin requirements remained extremely minimal throughout his hospitalization.  On the morning of POD 7, he was afebrile and hemodynamically stable.  He was tolerating small amounts of oral intake with no nausea or vomiting and having bowel function.  His pain was well controlled on an oral regimen.  He was ambulating with assistance.  He was examined and deemed appropriate for discharge home with close follow-up in surgery clinic in 1 week.  He was instructed to continue holding his hypertensive medications at home, which had previously been discontinued during chemotherapy due to orthostasis.  He was instructed to follow-up with his PCP in 1 to 2 weeks after discharge for BP recheck.  He may resume his Eliquis at discharge. ? ?Consults: None ? ?Significant Diagnostic Studies:  ?Surgical Pathology: ?FINAL MICROSCOPIC DIAGNOSIS:  ? ?A. COMMON BILE DUCT MARGIN, DISTAL:  ?Bile duct with chronic inflammation  ?Negative for carcinoma  ? ?B. PANCREATIC NECK MARGIN:  ?Chronic pancreatitis  ?Negative for carcinoma  ? ?C. GALLBLADDER, CHOLECYSTECTOMY:  ?Acute and chronic chronic cholecystitis  ?Negative for carcinoma  ? ?D. 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 ?ONCOLOGY TABLE:  ? ?PANCREAS (EXOCRINE), CARCINOMA: Resection  ? ?Procedure: Whipple  procedure  ?Tumor Site: Pancreatic head  ?Tumor Size: 2.6 x 2.2 x 1.8 cm  ?Histologic Type: Ductal adenocarcinoma  ?Histologic Grade: Moderate to poorly differentiated  ?Tumor Extension: Tumor invades peripancreatic periduodenal soft tissue  ?Treatment Effect: No known presurgical therapy  ?Lymphovascular Invasion: Not identified  ?Perineural Invasion: Present  ?Margins:  ?Margin Status for Invasive Carcinoma: All margins negative for invasive  ?carcinoma  ?Regional Lymph Nodes:  ?     Number of Lymph Nodes with Tumor: 0  ?     Number of Lymph Nodes Examined:16  ?Distant Metastasis: Not applicable  ?Pathologic Stage Classification (pTNM, AJCC 8th Edition): pT2, pN0  ? ?Treatments: IV hydration, analgesia: acetaminophen, Dilaudid, and oxycodone, anticoagulation: LMW heparin, therapies: PT, and surgery: Whipple procedure ? ?Discharge Exam: ?General: resting comfortably, NAD ?Neuro: alert and oriented, no focal deficits ?Resp: normal work of breathing on room air ?CV: RRR ?Abdomen: soft, nondistended, nontender to palpation.Midline incision clean and dry. RUQ drain sites clean and dry. ?Extremities: warm and well-perfused ? ? ?Disposition: Discharge disposition: 06-Home-Health Care Svc ? ? ? ? ? ? ?Patient Instructions:  ?Allergies as of 11/22/2021   ?No Known Allergies ?  ? ?  ?Medication List  ?  ? ?STOP taking these medications   ? ?mupirocin ointment 2 % ?Commonly known as: BACTROBAN ?  ? ?  ? ?TAKE these medications   ? ?acetaminophen 500 MG tablet ?Commonly known as: TYLENOL ?Take 1 tablet (500 mg total) by mouth every 6 (six) hours as needed for mild pain. ?  ?apixaban 5 MG Tabs tablet ?Commonly known as: ELIQUIS ?Take 1 tablet (5 mg total) by mouth 2 (two) times daily. ?  ?dexamethasone 4 MG tablet ?Commonly known as: DECADRON ?Take 1 tablet (4 mg total) by mouth daily. For 3-5 days after chemo for nausea and fatigue ?  ?diltiazem 30 MG tablet ?Commonly known as: CARDIZEM ?Take 1 tablet (30 mg total) by mouth  every 8 (eight) hours as needed (heart rate fast (more than 100 beats/min at rest)). ?  ?docusate sodium 100 MG capsule ?Commonly known as: COLACE ?Take 1 capsule (100 mg total) by mouth 2 (two) times daily. ?  ?escitalopram 20 MG tablet ?Commonly known as: LEXAPRO ?Take 1 tablet (20 mg total) by mouth daily. ?  ?famotidine 20 MG tablet ?Commonly known as: PEPCID ?Take 1 tablet (20 mg total) by mouth 2 (two) times daily. ?  ?feeding supplement Liqd ?Take 237 mLs by mouth 3 (three) times daily between meals. ?  ?hydrochlorothiazide 12.5 MG tablet ?Commonly known as: HYDRODIURIL ?Take 12.5 mg by mouth daily as needed (fluid). ?  ?mirtazapine 7.5 MG tablet ?Commonly known as: REMERON ?Take 1 tablet (7.5 mg total) by mouth at bedtime. ?  ?multivitamin with minerals Tabs tablet ?Take 1 tablet by mouth daily. ?  ?ondansetron 4 MG tablet ?Commonly known as: Zofran ?Take 1 tablet (4 mg total) by mouth every 8 (eight) hours as needed for nausea or vomiting. ?  ?oxyCODONE 5 MG immediate release tablet ?Commonly known as: Oxy IR/ROXICODONE ?Take 1 tablet (5 mg total) by mouth every 6 (six) hours as needed for up to 5 days for moderate pain or severe pain. ?What changed: reasons to take this ?  ?  pantoprazole 40 MG tablet ?Commonly known as: PROTONIX ?Take 1 tablet (40 mg total) by mouth daily. ?  ?polyethylene glycol 17 g packet ?Commonly known as: MIRALAX / GLYCOLAX ?Take 17 g by mouth daily as needed for moderate constipation. ?  ?prochlorperazine 10 MG tablet ?Commonly known as: COMPAZINE ?Take 1 tablet (10 mg total) by mouth every 6 (six) hours as needed (Nausea or vomiting). ?  ?sucralfate 1 GM/10ML suspension ?Commonly known as: Carafate ?Take 10 mLs (1 g total) by mouth 2 (two) times daily. ?  ?Synthroid 112 MCG tablet ?Generic drug: levothyroxine ?Take 1 tablet (112 mcg total) by mouth daily before breakfast. ?  ?vitamin B-12 1000 MCG tablet ?Commonly known as: CYANOCOBALAMIN ?Take 1,000 mcg by mouth 2 (two) times  daily. ?  ?Vitamin D3 50 MCG (2000 UT) Tabs ?Take 2,000 Units by mouth daily. ?  ? ?  ? ?  ?  ? ? ?  ?Durable Medical Equipment  ?(From admission, onward)  ?  ? ? ?  ? ?  Start     Ordered  ? 11/20/21 0654  For

## 2021-11-22 NOTE — Progress Notes (Signed)
Pt refusing dulcolax suppository, order placed this morning.  Called Dr. Zenia Resides and she is okay with holding.  Lack of BM will not hold up D/C since pt is passing gas. ?

## 2021-11-22 NOTE — Progress Notes (Signed)
Mobility Specialist Progress Note ? ? 11/22/21 1136  ?Mobility  ?Activity Ambulated with assistance in hallway  ?Level of Assistance Minimal assist, patient does 75% or more  ?Assistive Device Front wheel walker  ?Distance Ambulated (ft) 246 ft  ?Activity Response Tolerated well  ?$Mobility charge 1 Mobility  ? ?Pre Mobility: 69 HR ?During Mobility: 93 HR, ?Post Mobility: 72 HR ? ?Received pt in chair having no complaints and agreeable. X1 standing rest break d/t fatigue, encouraged PLB but pt stated to be feeling dizzy when practicing. Declined to take a seat and returned back to chair w/o fault. Call bell in reach and needs met.  ? ?Jeremiah Hedrick ?Mobility Specialist ?Phone Number [phone] ? ?

## 2021-11-22 NOTE — Progress Notes (Signed)
Discharge instructions given. Patient verbalized understanding and all questions were answered.  ?

## 2021-11-22 NOTE — Progress Notes (Signed)
Therapy in to work with pt, reports pt had a large BM. ?

## 2021-11-22 NOTE — Progress Notes (Signed)
No acute complaints this morning. Ambulated down hallway yesterday. Denies dizziness today, had a little bit yesterday morning. Eating small amounts but does report reflux. Passing flatus. Will give suppository this morning. Patient says he would like to go home. I reviewed diet instructions and encouraged 5-6 small meals throughout the day rather than 3 large meals. ? ?Will hold HCTZ given prior orthostasis. I discussed with patient's daughter, reviewed that diminished appetite after whipple is very common, and I reviewed the diet instructions with her. Patient should also consume 2-3 protein shakes daily. HHPT has been arranged. Will discharge patient home today with family. He will follow up in clinic next week. ?

## 2021-11-23 ENCOUNTER — Telehealth: Payer: Self-pay | Admitting: Hematology

## 2021-11-23 NOTE — Telephone Encounter (Signed)
.  Called patient to schedule appointment per 4/10 inbasket, patient is aware of date and time.   ?

## 2021-11-24 LAB — BPAM RBC
Blood Product Expiration Date: 202305032359
Blood Product Expiration Date: 202305032359
Blood Product Expiration Date: 202305032359
Blood Product Expiration Date: 202305032359
ISSUE DATE / TIME: 202304041330
ISSUE DATE / TIME: 202304100938
Unit Type and Rh: 6200
Unit Type and Rh: 6200
Unit Type and Rh: 6200
Unit Type and Rh: 6200

## 2021-11-24 LAB — TYPE AND SCREEN
ABO/RH(D): A POS
Antibody Screen: NEGATIVE
Unit division: 0
Unit division: 0
Unit division: 0
Unit division: 0

## 2021-12-20 ENCOUNTER — Other Ambulatory Visit: Payer: Self-pay

## 2021-12-20 ENCOUNTER — Telehealth: Payer: Self-pay | Admitting: Nurse Practitioner

## 2021-12-20 DIAGNOSIS — I480 Paroxysmal atrial fibrillation: Secondary | ICD-10-CM

## 2021-12-20 MED ORDER — APIXABAN 5 MG PO TABS: 5.0000 mg | ORAL_TABLET | Freq: Two times a day (BID) | ORAL | 2 refills | Status: DC

## 2021-12-20 NOTE — Telephone Encounter (Signed)
Patient only has two tablets of the Eliquis left and they want to make sure you still want him to be on this medication. Please advise.  ?

## 2021-12-20 NOTE — Telephone Encounter (Signed)
Called pt spoke to daughter Rx was sent to the pharmacy ?

## 2021-12-27 ENCOUNTER — Ambulatory Visit (INDEPENDENT_AMBULATORY_CARE_PROVIDER_SITE_OTHER): Payer: Medicare HMO | Admitting: Cardiology

## 2021-12-27 VITALS — BP 128/70 | HR 69 | Ht 70.0 in | Wt 128.4 lb

## 2021-12-27 DIAGNOSIS — I480 Paroxysmal atrial fibrillation: Secondary | ICD-10-CM | POA: Diagnosis not present

## 2021-12-27 DIAGNOSIS — C259 Malignant neoplasm of pancreas, unspecified: Secondary | ICD-10-CM | POA: Diagnosis not present

## 2021-12-27 DIAGNOSIS — I700 Atherosclerosis of aorta: Secondary | ICD-10-CM | POA: Diagnosis not present

## 2021-12-27 DIAGNOSIS — I10 Essential (primary) hypertension: Secondary | ICD-10-CM | POA: Diagnosis not present

## 2021-12-27 NOTE — Progress Notes (Signed)
Cardiology Office Note:    Date:  12/27/2021   ID:  [name], DOB 1941/07/04, MRN [id number]  PCP:  Ronnell Freshwater, NP  Cardiologist:  Buford Dresser, MD  Referring MD: Ronnell Freshwater, NP   CC: follow up  History of Present Illness:    Angel Keller. is a 81 y.o. male with a hx of hypertension, hyperlipidemia, pancreatic cancer, sleep apnea, and anxiety who is seen for follow up today. I initially met him 09/29/21 as a new consult at the request of Ronnell Freshwater, NP for the evaluation and management of paroxysmal atrial fibrillation.  He was hospitalized 08/27/21 for fever. The fever started the evening after his chemotherapy. His daughter called 911 when he showed weakness and a temperature of 101. While in the ED, his blood pressure dropped to 80/40 and he required IV fluid. Although he had no source of infection, he was started on broad spectrum antibiotics for suspected sepsis. EKG at this time showed atrial fibrillation. He reported he was on Eliquis in the past but had not taken it in a few years. Blood and urine cultures were positive for streptococcus and infection. He was started on Eliquis and diltiazem and was discharged with amoxicillin.   He followed-up with Dr. Junius Creamer 09/07/21. He continued to feel tired and weak. He was referred to cardiology for paroxysmal atrial fibrillation found during his hospital visit.   Today: Has good days and bad days. Yesterday was at a family cookout, ate a burger, hot dog, and potato salad. Also walked around 7 acres of land. Stops eating when he gets nauseated. Weigh has been up and down.   Has a home nurse coming out. Blood pressure has been stable. Has not noticed any afib, but HR does speed up with activity. Tolerating apixaban, no bleeding issues. Working out with therapy bands and light dumbbells.   Not currently taking HCTZ.  Denies chest pain, shortness of breath at rest or with normal exertion. No PND,  orthopnea, LE edema or unexpected weight gain. No syncope or palpitations.   Past Medical History:  Diagnosis Date   Aneurysm ([place])    2017   Anxiety    Arthritis    Depression    Family history of breast cancer    Family history of lung cancer    Headache    HLD (hyperlipidemia)    Hx of inguinal hernia repair    Hypertension    Hypothyroidism    Overactive bladder    Pancreatic cancer (HCC)    Pneumonia    Shingles    Sleep apnea    Patient denies   Thoracic ascending aortic aneurysm ([place])    mildly dilated 4.0cm per 08/12/21 CT   Thyroid disease    Varicose veins of both lower extremities     Past Surgical History:  Procedure Laterality Date   BILATERAL CARPAL TUNNEL RELEASE     CATARACT EXTRACTION, BILATERAL Bilateral    ELBOW SURGERY Bilateral    ENDOSCOPIC RETROGRADE CHOLANGIOPANCREATOGRAPHY (ERCP) WITH PROPOFOL N/A 07/20/2021   Procedure: ENDOSCOPIC RETROGRADE CHOLANGIOPANCREATOGRAPHY (ERCP) WITH PROPOFOL;  Surgeon: Gatha Mayer, MD;  Location: [facility] ENDOSCOPY;  Service: Endoscopy;  Laterality: N/A;   ESOPHAGOGASTRODUODENOSCOPY (EGD) WITH PROPOFOL N/A 08/05/2021   Procedure: ESOPHAGOGASTRODUODENOSCOPY (EGD) WITH PROPOFOL;  Surgeon: Rush Landmark Telford Nab., MD;  Location: [place];  Service: Gastroenterology;  Laterality: N/A;   EUS N/A 08/05/2021   Procedure: UPPER ENDOSCOPIC ULTRASOUND (EUS) RADIAL;  Surgeon: Irving Copas., MD;  Location: [facility] ENDOSCOPY;  Service: Gastroenterology;  Laterality: N/A;   EYE SURGERY     bilateral cataracts   FINE NEEDLE ASPIRATION  08/05/2021   Procedure: FINE NEEDLE ASPIRATION (FNA) LINEAR;  Surgeon: Irving Copas., MD;  Location: [place];  Service: Gastroenterology;;   INGUINAL HERNIA REPAIR Right 1951   IR ANGIO INTRA EXTRACRAN SEL COM CAROTID INNOMINATE BILAT MOD SED  02/20/2019   IR BILIARY STENT(S) EXISTING ACCESS INC DILATION CATH EXCHANGE  09/20/2021   IR ENDOLUMINAL BX OF BILIARY TREE  07/22/2021    IR GENERIC HISTORICAL  10/28/2016   IR RADIOLOGIST EVAL & MGMT 10/28/2016 [facility]   IR INT EXT BILIARY DRAIN WITH CHOLANGIOGRAM  07/22/2021   JOINT REPLACEMENT     LAPAROSCOPY N/A 11/15/2021   Procedure: LAPAROSCOPIC DIAGNOSTIC STAGING;  Surgeon: Dwan Bolt, MD;  Location: [place];  Service: General;  Laterality: N/A;   PORTACATH PLACEMENT N/A 08/19/2021   Procedure: INSERTION PORT-A-CATH;  Surgeon: Dwan Bolt, MD;  Location: [facility];  Service: General;  Laterality: N/A;  LMA   RADIOLOGY WITH ANESTHESIA N/A 10/21/2015   Procedure: RADIOLOGY WITH ANESTHESIA;  Surgeon: Luanne Bras, MD;  Location: [place];  Service: Radiology;  Laterality: N/A;   RADIOLOGY WITH ANESTHESIA N/A 02/20/2019   Procedure: [employer];  Surgeon: Luanne Bras, MD;  Location: [place];  Service: Radiology;  Laterality: N/A;   ROTATOR CUFF REPAIR Bilateral    VARICOSE VEIN SURGERY     WHIPPLE PROCEDURE N/A 11/15/2021   Procedure: WHIPPLE PROCEDURE;  Surgeon: Dwan Bolt, MD;  Location: [place];  Service: General;  Laterality: N/A;    Current Medications: Current Outpatient Medications on File Prior to Visit  Medication Sig   acetaminophen (TYLENOL) 500 MG tablet Take 1 tablet (500 mg total) by mouth every 6 (six) hours as needed for mild pain.   apixaban (ELIQUIS) 5 MG TABS tablet Take 1 tablet (5 mg total) by mouth 2 (two) times daily.   Cholecalciferol (VITAMIN D3) 50 MCG (2000 UT) TABS Take 2,000 Units by mouth daily.   diltiazem (CARDIZEM) 30 MG tablet Take 1 tablet (30 mg total) by mouth every 8 (eight) hours as needed (heart rate fast (more than 100 beats/min at rest)).   docusate sodium (COLACE) 100 MG capsule Take 1 capsule (100 mg total) by mouth 2 (two) times daily. (Patient taking differently: Take 100 mg by mouth 2 (two) times daily as needed.)   escitalopram (LEXAPRO) 20 MG tablet Take 1 tablet (20 mg total) by mouth daily.   famotidine (PEPCID) 20 MG tablet Take 1 tablet (20 mg total)  by mouth 2 (two) times daily.   feeding supplement (ENSURE ENLIVE / ENSURE PLUS) LIQD Take 237 mLs by mouth 3 (three) times daily between meals.   mirtazapine (REMERON) 7.5 MG tablet Take 1 tablet (7.5 mg total) by mouth at bedtime.   ondansetron (ZOFRAN) 4 MG tablet Take 1 tablet (4 mg total) by mouth every 8 (eight) hours as needed for nausea or vomiting. (Patient taking differently: Take 8 mg by mouth every 8 (eight) hours as needed for nausea or vomiting.)   pantoprazole (PROTONIX) 40 MG tablet Take 1 tablet (40 mg total) by mouth daily.   polyethylene glycol (MIRALAX / GLYCOLAX) 17 g packet Take 17 g by mouth daily as needed for moderate constipation.   prochlorperazine (COMPAZINE) 10 MG tablet Take 1 tablet (10 mg total) by mouth every 6 (six) hours as needed (Nausea or vomiting).   SYNTHROID 112  MCG tablet Take 1 tablet (112 mcg total) by mouth daily before breakfast.   vitamin B-12 (CYANOCOBALAMIN) 1000 MCG tablet Take 1,000 mcg by mouth 2 (two) times daily.   dexamethasone (DECADRON) 4 MG tablet Take 1 tablet (4 mg total) by mouth daily. For 3-5 days after chemo for nausea and fatigue (Patient not taking: Reported on 11/05/2021)   Multiple Vitamin (MULTIVITAMIN WITH MINERALS) TABS tablet Take 1 tablet by mouth daily. (Patient not taking: Reported on 11/05/2021)   No current facility-administered medications on file prior to visit.     Allergies:   Patient has no known allergies.   Social History   Tobacco Use   Smoking status: Former    Packs/day: 0.25    Years: 1.00    Pack years: 0.25    Types: Cigarettes    Quit date: 08/15/1961    Years since quitting: 60.4   Smokeless tobacco: Never  Vaping Use   Vaping Use: Never used  Substance Use Topics   Alcohol use: No   Drug use: No    Family History: family history includes Alcoholism in his sister; Breast cancer in his mother; Cirrhosis in his sister; Lung cancer in his father. There is no history of Colon cancer, Esophageal  cancer, Liver cancer, Pancreatic cancer, or Prostate cancer.  ROS:   Please see the history of present illness.  Additional pertinent ROS otherwise unremarkable.  EKGs/Labs/Other Studies Reviewed:    The following studies were reviewed today: Echo 08/28/21  1. Left ventricular ejection fraction, by estimation, is 60 to 65%. The  left ventricle has normal function. The left ventricle has no regional  wall motion abnormalities. Left ventricular diastolic parameters are  consistent with Grade I diastolic  dysfunction (impaired relaxation).   2. Right ventricular systolic function is normal. The right ventricular  size is normal. There is normal pulmonary artery systolic pressure.   3. Left atrial size was mildly dilated.   4. Right atrial size was mildly dilated.   5. The mitral valve is grossly normal. Trivial mitral valve  regurgitation.   6. The aortic valve is tricuspid. There is mild calcification of the  aortic valve. There is mild thickening of the aortic valve. Aortic valve  regurgitation is not visualized. Aortic valve sclerosis/calcification is  present, without any evidence of  aortic stenosis.   7. The inferior vena cava is normal in size with greater than 50%  respiratory variability, suggesting right atrial pressure of 3 mmHg.   Comparison(s): No prior Echocardiogram.  Lower Venous DVT 07/28/20 RIGHT:  - Findings consistent with age indeterminate deep vein thrombosis  involving the right popliteal vein.  - No cystic structure found in the popliteal fossa.     LEFT:  - There is no evidence of deep vein thrombosis in the lower extremity.  - No cystic structure found in the popliteal fossa.  EKG:  EKG is personally reviewed.   09/29/21: sinus bradycardia at 55 bpm  Recent Labs: 07/27/2021: TSH 4.060 08/28/2021: Magnesium 2.1 11/18/2021: ALT 36 11/22/2021: BUN 18; Creatinine, Ser 0.90; Hemoglobin 9.6; Platelets 253; Potassium 3.5; Sodium 136  Recent Lipid Panel     Component Value Date/Time   CHOL (H) 07/10/2009 0540    257        ATP III CLASSIFICATION:  <200     mg/dL   Desirable  200-239  mg/dL   Borderline High  >=240    mg/dL   High          TRIG  125 07/27/2020 1730   TRIG 150 (H) 07/11/2006 0731   HDL 44 07/10/2009 0540   CHOLHDL 5.8 07/10/2009 0540   VLDL 53 (H) 07/10/2009 0540   LDLCALC (H) 07/10/2009 0540    160        Total Cholesterol/HDL:CHD Risk Coronary Heart Disease Risk Table                     Men   Women  1/2 Average Risk   3.4   3.3  Average Risk       5.0   4.4  2 X Average Risk   9.6   7.1  3 X Average Risk  23.4   11.0        Use the calculated Patient Ratio above and the CHD Risk Table to determine the patient's CHD Risk.        ATP III CLASSIFICATION (LDL):  <100     mg/dL   Optimal  100-129  mg/dL   Near or Above                    Optimal  130-159  mg/dL   Borderline  160-189  mg/dL   High  >190     mg/dL   Very High   LDLDIRECT 149.7 11/07/2008 0000    Physical Exam:    VS:  BP 128/70 (BP Location: Right Arm, Patient Position: Sitting)   Pulse 69   Ht _0  (1.778 m)   Wt 128 lb 6.4 oz (58.2 kg)   SpO2 98%   BMI 18.42 kg/m     Wt Readings from Last 3 Encounters:  12/27/21 128 lb 6.4 oz (58.2 kg)  11/17/21 149 lb 0.5 oz (67.6 kg)  11/09/21 148 lb 12.8 oz (67.5 kg)    GEN: Frail appearing, thin elderly gentleman with temporal muscle wasting, in no acute distress HEENT: Normal, moist mucous membranes NECK: No JVD CARDIAC: regular rhythm, normal S1 and S2, no rubs or gallops. No murmur. VASCULAR: Radial and DP pulses 2+ bilaterally. No carotid bruits RESPIRATORY:  Clear to auscultation without rales, wheezing or rhonchi  ABDOMEN: Soft, non-tender, non-distended MUSCULOSKELETAL:  Ambulates independently SKIN: Warm and dry, trivial edema NEUROLOGIC:  Alert and oriented x 3. No focal neuro deficits noted. PSYCHIATRIC:  Normal affect    ASSESSMENT:    1. Paroxysmal atrial fibrillation  (HCC)   2. Aortic atherosclerosis ([place])   3. Pancreatic adenocarcinoma ([place])   4. Essential hypertension     PLAN:    Paroxysmal atrial fibrillation, in the setting of sepsis/bacteremia -has not been told previously that he has any rhythm issues -tolerating apixaban 5 mg BID. Was on in the past for DVT 07/2020. Confirmed that he is not taking aspirin currently -CHA2DS2/VAS Stroke Risk Points= 3  -it is reasonable to continue apixaban, especially given hypercoagulability from cancer, but if needed this can be held/stopped for bleeding or procedures -diltiazem PRN -no longer on bisoprolol -he is interested in generic DOAC when available. We discussed options for anticoagulation long term, he is amenable to this without any bleeding issues.  Hypertension -well controlled today, not on HCTZ. Would not restart unless BP rises significantly.  Pancreatic adenocarcinoma, stage 1b -neoadjuvant chemo with gemcitabine/abraxane initially, -now s/p Whipple 11/2021.  Aortic atherosclerosis: seen on CT -has had elevated LFTs, once these normalize discuss risk/benefit of statin  Plan for follow up: 6 months or sooner as needed  Buford Dresser, MD, PhD, [place]  [facility]    Medication Adjustments/Labs and Tests Ordered: Current medicines are reviewed at length with the patient today.  Concerns regarding medicines are outlined above.  No orders of the defined types were placed in this encounter.  No orders of the defined types were placed in this encounter.   Patient Instructions  Medication Instructions:  STOP: Hydrochlorothiazide   *If you need a refill on your cardiac medications before your next appointment, please call your pharmacy*   Lab Work: None ordered today   Testing/Procedures: None ordered today   Follow-Up: At [facility], you and your health needs are our priority.  As part of our continuing mission to provide you with exceptional heart  care, we have created designated Provider Care Teams.  These Care Teams include your primary Cardiologist (physician) and Advanced Practice Providers (APPs -  Physician Assistants and Nurse Practitioners) who all work together to provide you with the care you need, when you need it.  We recommend signing up for the patient portal called "MyChart".  Sign up information is provided on this After Visit Summary.  MyChart is used to connect with patients for Virtual Visits (Telemedicine).  Patients are able to view lab/test results, encounter notes, upcoming appointments, etc.  Non-urgent messages can be sent to your provider as well.   To learn more about what you can do with MyChart, go to [email or web address].    Your next appointment:   6 month(s)  The format for your next appointment:   In Person  Provider:   Buford Dresser, MD{   Important Information About Sugar         Signed, Buford Dresser, MD PhD 12/27/2021     [place]

## 2021-12-27 NOTE — Patient Instructions (Signed)
Medication Instructions:  ?STOP: Hydrochlorothiazide ?  ?*If you need a refill on your cardiac medications before your next appointment, please call your pharmacy* ? ? ?Lab Work: ?None ordered today ? ? ?Testing/Procedures: ?None ordered today ? ? ?Follow-Up: ?At [facility], you and your health needs are our priority.  As part of our continuing mission to provide you with exceptional heart care, we have created designated Provider Care Teams.  These Care Teams include your primary Cardiologist (physician) and Advanced Practice Providers (APPs -  Physician Assistants and Nurse Practitioners) who all work together to provide you with the care you need, when you need it. ? ?We recommend signing up for the patient portal called "MyChart".  Sign up information is provided on this After Visit Summary.  MyChart is used to connect with patients for Virtual Visits (Telemedicine).  Patients are able to view lab/test results, encounter notes, upcoming appointments, etc.  Non-urgent messages can be sent to your provider as well.   ?To learn more about what you can do with MyChart, go to [email or web address].   ? ?Your next appointment:   ?6 month(s) ? ?The format for your next appointment:   ?In Person ? ?Provider:   ?Buford Dresser, MD{ ? ? ?Important Information About Sugar ? ? ? ? ? ? ?

## 2021-12-29 ENCOUNTER — Inpatient Hospital Stay: Payer: Medicare HMO | Admitting: Dietician

## 2021-12-29 ENCOUNTER — Inpatient Hospital Stay: Payer: Medicare HMO

## 2021-12-29 ENCOUNTER — Inpatient Hospital Stay: Payer: Medicare HMO | Admitting: Hematology

## 2022-01-04 ENCOUNTER — Encounter: Payer: Self-pay | Admitting: Hematology

## 2022-01-04 ENCOUNTER — Other Ambulatory Visit: Payer: Self-pay

## 2022-01-04 ENCOUNTER — Encounter: Payer: Medicare HMO | Admitting: Nutrition

## 2022-01-04 ENCOUNTER — Inpatient Hospital Stay (HOSPITAL_BASED_OUTPATIENT_CLINIC_OR_DEPARTMENT_OTHER): Payer: Medicare HMO | Admitting: Hematology

## 2022-01-04 ENCOUNTER — Inpatient Hospital Stay: Payer: Medicare HMO | Attending: Physician Assistant

## 2022-01-04 ENCOUNTER — Inpatient Hospital Stay: Payer: Medicare HMO | Admitting: Nutrition

## 2022-01-04 VITALS — BP 173/93 | HR 58 | Temp 98.2°F | Resp 18 | Ht 70.0 in | Wt 130.4 lb

## 2022-01-04 DIAGNOSIS — I10 Essential (primary) hypertension: Secondary | ICD-10-CM | POA: Insufficient documentation

## 2022-01-04 DIAGNOSIS — Z79899 Other long term (current) drug therapy: Secondary | ICD-10-CM | POA: Diagnosis not present

## 2022-01-04 DIAGNOSIS — R197 Diarrhea, unspecified: Secondary | ICD-10-CM | POA: Diagnosis not present

## 2022-01-04 DIAGNOSIS — Z95828 Presence of other vascular implants and grafts: Secondary | ICD-10-CM

## 2022-01-04 DIAGNOSIS — C251 Malignant neoplasm of body of pancreas: Secondary | ICD-10-CM

## 2022-01-04 DIAGNOSIS — C250 Malignant neoplasm of head of pancreas: Secondary | ICD-10-CM | POA: Insufficient documentation

## 2022-01-04 LAB — CBC WITH DIFFERENTIAL (CANCER CENTER ONLY)
Abs Immature Granulocytes: 0.02 10*3/uL (ref 0.00–0.07)
Basophils Absolute: 0 10*3/uL (ref 0.0–0.1)
Basophils Relative: 0 %
Eosinophils Absolute: 0.1 10*3/uL (ref 0.0–0.5)
Eosinophils Relative: 3 %
HCT: 29.6 % — ABNORMAL LOW (ref 39.0–52.0)
Hemoglobin: 9.5 g/dL — ABNORMAL LOW (ref 13.0–17.0)
Immature Granulocytes: 0 %
Lymphocytes Relative: 34 %
Lymphs Abs: 1.9 10*3/uL (ref 0.7–4.0)
MCH: 25.7 pg — ABNORMAL LOW (ref 26.0–34.0)
MCHC: 32.1 g/dL (ref 30.0–36.0)
MCV: 80.2 fL (ref 80.0–100.0)
Monocytes Absolute: 0.6 10*3/uL (ref 0.1–1.0)
Monocytes Relative: 10 %
Neutro Abs: 2.8 10*3/uL (ref 1.7–7.7)
Neutrophils Relative %: 53 %
Platelet Count: 187 10*3/uL (ref 150–400)
RBC: 3.69 MIL/uL — ABNORMAL LOW (ref 4.22–5.81)
RDW: 14.2 % (ref 11.5–15.5)
WBC Count: 5.4 10*3/uL (ref 4.0–10.5)
nRBC: 0 % (ref 0.0–0.2)

## 2022-01-04 LAB — CMP (CANCER CENTER ONLY)
ALT: 9 U/L (ref 0–44)
AST: 21 U/L (ref 15–41)
Albumin: 3.2 g/dL — ABNORMAL LOW (ref 3.5–5.0)
Alkaline Phosphatase: 141 U/L — ABNORMAL HIGH (ref 38–126)
Anion gap: 3 — ABNORMAL LOW (ref 5–15)
BUN: 18 mg/dL (ref 8–23)
CO2: 31 mmol/L (ref 22–32)
Calcium: 8.7 mg/dL — ABNORMAL LOW (ref 8.9–10.3)
Chloride: 105 mmol/L (ref 98–111)
Creatinine: 1.2 mg/dL (ref 0.61–1.24)
GFR, Estimated: >60 mL/min (ref >60)
Glucose, Bld: 96 mg/dL (ref 70–99)
Potassium: 3.9 mmol/L (ref 3.5–5.1)
Sodium: 139 mmol/L (ref 135–145)
Total Bilirubin: 0.5 mg/dL (ref 0.3–1.2)
Total Protein: 6.7 g/dL (ref 6.5–8.1)

## 2022-01-04 MED ORDER — PANCRELIPASE (LIP-PROT-AMYL) 36000-114000 UNITS PO CPEP: ORAL_CAPSULE | ORAL | 2 refills | Status: DC

## 2022-01-04 MED ORDER — SODIUM CHLORIDE 0.9% FLUSH
10.0000 mL | INTRAVENOUS | Status: DC | PRN
  Administered 2022-01-04: 10 mL

## 2022-01-04 NOTE — Addendum Note (Signed)
Addended by: Truitt Merle on: 01/04/2022 02:07 PM   Modules accepted: Orders

## 2022-01-04 NOTE — Progress Notes (Signed)
[place]   Telephone:([phone]) [phone] Fax:(336) [phone]   Clinic Follow up Note   Patient Care Team: Angel Freshwater, NP as PCP - General (Family Medicine) Angel Dresser, MD as PCP - Cardiology (Cardiology) Angel Merle, MD as Consulting Physician (Oncology) Angel Bake, RN as Oncology Nurse Navigator (Oncology)  Date of Service:  01/04/2022  CHIEF COMPLAINT: f/u of pancreatic cancer  CURRENT THERAPY:  Surveillance  ASSESSMENT & PLAN:  Angel Ion. is a 81 y.o. male with   1. 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 EUS and pancreatic mass biopsy on 08/05/21 confirmed adenocarcinoma. -Pancreatic protocol CT scan showed 3.6 x 2.6 cm pancreatic head mass, partially involving multiple vessels, including SMV, portal vein, and hepatic artery.  This is borderline resectable. -Patient was evaluated by Angel Keller, who recommends neoadjuvant chemotherapy, which I agree. -he began gemcitabine alone on 08/20/21. He was admitted for fever after cycle 1. He tolerated cycle 2 with gem and abraxane poorly overall with fatigue, low appetite, and a mild intermittent nausea. -due to low BP, we have held chemo after cycle 2. -restaging CT AP on 10/20/21 showed partial response to therapy.  -he proceeded to whipple procedure on 11/15/21 with Angel Keller. 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 Margins and lymph nodes negative. -we discussed adjuvant chemotherapy. I recommend restarting gemcitabine. We will plan to start in 2 weeks. If he tolerates well, may consider add xeloda from cycle 2  -he is recovering from surgery. Labs reviewed, overall stable to improved. -BP high today, he will restart BP med   2. Diarrhea, abdominal cramping and weight loss  -secondary to pancreatic surgery -I will call in creon for him to  try.  3. HTN -he was holding his BP medicines during chemotherapy. -his BP is 173/93 today; I advised them to discuss with his cardiologist to restart his medications.    PLAN -I called in creon -lab, flush, and gemcitabine week of 6/5, then 1, 3, and 4 weeks after -f/u C1D8 and C2D1   No problem-specific Assessment & Plan notes found for this encounter.   SUMMARY OF ONCOLOGIC HISTORY: Oncology History Overview Note   Cancer Staging  Pancreatic cancer [facility]) Staging form: Exocrine Pancreas, AJCC 8th Edition - Clinical stage from 08/05/2021: Stage IB (cT2, cN0, cM0) - Signed by Angel Merle, MD on 08/17/2021 Stage prefix: Initial diagnosis Total positive nodes: 0     Pancreatic mass  07/18/2021 Initial Diagnosis   Pancreatic mass   07/18/2021 Imaging   CT Abdomen Pelvis W Contrast  IMPRESSION: 1. Suspected solid mass within the proximal body of the pancreas measures 2.3 x 2.6 x 2.0 cm. There is downstream dilation of the main pancreatic duct and its branches. There is also intra and extrahepatic biliary ductal dilation. Further evaluation with MRI of the abdomen, pancreatic protocol, when clinically feasible, may be considered. 2. 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 3. Aortic atherosclerosis   07/20/2021 Procedure   DG ERCP  IMPRESSION: Nondiagnostic ERCP as above. Correlation with the operative report is advised.  - The examination was suspicious for a biliary stricture in the bile duct. - Examination was suspicious for carcinoma of the head of the pancreas. - Attempts at a cholangiogram failed.   07/22/2021 Pathology Results   CASE: [id number]   FINAL MICROSCOPIC DIAGNOSIS:  - Suspicious for malignancy  - See comment  DIAGNOSTIC COMMENTS:  There are rare cells with cytologic atypia suspicious for  adenocarcinoma.  Dr. Vic Ripper agrees.    07/22/2021 Procedure   IR INT EXT BILIARY DRAIN WITH CHOLANGIOGRAM ( IMPRESSION: 1.  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 2. Successful brush biopsy of obstructing lesion x3. 3. Successful placement of a 10 French internal/external biliary drainage catheter.   PLAN: 1. Follow bilirubin. When significant downward trend is clear, the bag should be capped. Recommend capping bag before discharge home if possible. 2. Follow-up in IR in 4-6 weeks for initial biliary tube check and exchange. If initial brush biopsies are negative, repeat biopsy could be considered at that time.   Pancreatic cancer ([place])  08/05/2021 Cancer Staging   Staging form: Exocrine Pancreas, AJCC 8th Edition - Clinical stage from 08/05/2021: Stage IB (cT2, cN0, cM0) - Signed by Angel Merle, MD on 08/17/2021 Stage prefix: Initial diagnosis Total positive nodes: 0    08/14/2021 Initial Diagnosis   Pancreatic cancer ([place])    08/20/2021 -  Chemotherapy   Patient is on Treatment Plan : PANCREATIC Abraxane / Gemcitabine D1,8,15 q28d      11/15/2021 Cancer Staging   Staging form: Exocrine Pancreas, AJCC 8th Edition - Pathologic stage from 11/15/2021: Stage IB (ypT2, pN0, cM0) - Signed by Angel Merle, MD on 01/03/2022 Stage prefix: Post-therapy Total positive nodes: 0 Histologic grade (G): G3 Histologic grading system: 3 grade system Residual tumor (R): R0 - None       INTERVAL HISTORY:  Angel Celeste Sr. is here for a follow up of pancreatic cancer. He was last seen by me on 10/25/21. He presents to the clinic accompanied by his daughter. He reports he is having a good day today. His daughter reports he has been having diarrhea, up to 4 times a day. He also reports stomach cramps. He reports he is experiencing lightheadedness, which he feels is at least partly related to not drinking enough.    All other systems were reviewed with the patient and are negative.  MEDICAL HISTORY:  Past Medical History:  Diagnosis Date   Aneurysm ([place])     2017   Anxiety    Arthritis    Depression    Family history of breast cancer    Family history of lung cancer    Headache    HLD (hyperlipidemia)    Hx of inguinal hernia repair    Hypertension    Hypothyroidism    Overactive bladder    Pancreatic cancer ([facility])    Pneumonia    Shingles    Sleep apnea    Patient denies   Thoracic ascending aortic aneurysm ([place])    mildly dilated 4.0cm per 08/12/21 CT   Thyroid disease    Varicose veins of both lower extremities     SURGICAL HISTORY: Past Surgical History:  Procedure Laterality Date   BILATERAL CARPAL TUNNEL RELEASE     CATARACT EXTRACTION, BILATERAL Bilateral    ELBOW SURGERY Bilateral    ENDOSCOPIC RETROGRADE CHOLANGIOPANCREATOGRAPHY (ERCP) WITH PROPOFOL N/A 07/20/2021   Procedure: ENDOSCOPIC RETROGRADE CHOLANGIOPANCREATOGRAPHY (ERCP) WITH PROPOFOL;  Surgeon: Gatha Mayer, MD;  Location: [facility] ENDOSCOPY;  Service: Endoscopy;  Laterality: N/A;   ESOPHAGOGASTRODUODENOSCOPY (EGD) WITH PROPOFOL N/A 08/05/2021   Procedure: ESOPHAGOGASTRODUODENOSCOPY (EGD) WITH PROPOFOL;  Surgeon: Rush Landmark Telford Nab., MD;  Location: [place];  Service: Gastroenterology;  Laterality: N/A;   EUS N/A 08/05/2021   Procedure: UPPER ENDOSCOPIC ULTRASOUND (EUS) RADIAL;  Surgeon: Irving Copas.,  MD;  Location: [place];  Service: Gastroenterology;  Laterality: N/A;   EYE SURGERY     bilateral cataracts   FINE NEEDLE ASPIRATION  08/05/2021   Procedure: FINE NEEDLE ASPIRATION (FNA) LINEAR;  Surgeon: Irving Copas., MD;  Location: [place];  Service: Gastroenterology;;   INGUINAL HERNIA REPAIR Right 1951   IR ANGIO INTRA EXTRACRAN SEL COM CAROTID INNOMINATE BILAT MOD SED  02/20/2019   IR BILIARY STENT(S) EXISTING ACCESS INC DILATION CATH EXCHANGE  09/20/2021   IR ENDOLUMINAL BX OF BILIARY TREE  07/22/2021   IR GENERIC HISTORICAL  10/28/2016   IR RADIOLOGIST EVAL & MGMT 10/28/2016 [facility]   IR INT EXT BILIARY DRAIN  WITH CHOLANGIOGRAM  07/22/2021   JOINT REPLACEMENT     LAPAROSCOPY N/A 11/15/2021   Procedure: LAPAROSCOPIC DIAGNOSTIC STAGING;  Surgeon: Dwan Bolt, MD;  Location: [facility];  Service: General;  Laterality: N/A;   PORTACATH PLACEMENT N/A 08/19/2021   Procedure: INSERTION PORT-A-CATH;  Surgeon: Dwan Bolt, MD;  Location: [facility];  Service: General;  Laterality: N/A;  LMA   RADIOLOGY WITH ANESTHESIA N/A 10/21/2015   Procedure: RADIOLOGY WITH ANESTHESIA;  Surgeon: Luanne Bras, MD;  Location: [place];  Service: Radiology;  Laterality: N/A;   RADIOLOGY WITH ANESTHESIA N/A 02/20/2019   Procedure: [employer];  Surgeon: Luanne Bras, MD;  Location: [place];  Service: Radiology;  Laterality: N/A;   ROTATOR CUFF REPAIR Bilateral    VARICOSE VEIN SURGERY     WHIPPLE PROCEDURE N/A 11/15/2021   Procedure: WHIPPLE PROCEDURE;  Surgeon: Dwan Bolt, MD;  Location: [place];  Service: General;  Laterality: N/A;    I have reviewed the social history and family history with the patient and they are unchanged from previous note.  ALLERGIES:  has No Known Allergies.  MEDICATIONS:  Current Outpatient Medications  Medication Sig Dispense Refill   lipase/protease/amylase (CREON) 36000 UNITS CPEP capsule Take 1 capsule (36,000 Units total) by mouth 3 (three) times daily with meals. May also take 1 capsule (36,000 Units total) as needed (with snacks). 90 capsule 2   acetaminophen (TYLENOL) 500 MG tablet Take 1 tablet (500 mg total) by mouth every 6 (six) hours as needed for mild pain. 30 tablet 0   apixaban (ELIQUIS) 5 MG TABS tablet Take 1 tablet (5 mg total) by mouth 2 (two) times daily. 60 tablet 2   Cholecalciferol (VITAMIN D3) 50 MCG (2000 UT) TABS Take 2,000 Units by mouth daily.     dexamethasone (DECADRON) 4 MG tablet Take 1 tablet (4 mg total) by mouth daily. For 3-5 days after chemo for nausea and fatigue (Patient not taking: Reported on 11/05/2021) 10 tablet 0   diltiazem (CARDIZEM) 30 MG  tablet Take 1 tablet (30 mg total) by mouth every 8 (eight) hours as needed (heart rate fast (more than 100 beats/min at rest)). 90 tablet 0   docusate sodium (COLACE) 100 MG capsule Take 1 capsule (100 mg total) by mouth 2 (two) times daily. (Patient taking differently: Take 100 mg by mouth 2 (two) times daily as needed.) 28 capsule 0   escitalopram (LEXAPRO) 20 MG tablet Take 1 tablet (20 mg total) by mouth daily. 90 tablet 1   famotidine (PEPCID) 20 MG tablet Take 1 tablet (20 mg total) by mouth 2 (two) times daily. 60 tablet 1   feeding supplement (ENSURE ENLIVE / ENSURE PLUS) LIQD Take 237 mLs by mouth 3 (three) times daily between meals. 237 mL 12   mirtazapine (REMERON) 7.5  MG tablet Take 1 tablet (7.5 mg total) by mouth at bedtime. 90 tablet 0   Multiple Vitamin (MULTIVITAMIN WITH MINERALS) TABS tablet Take 1 tablet by mouth daily. (Patient not taking: Reported on 11/05/2021) 30 tablet 0   ondansetron (ZOFRAN) 4 MG tablet Take 1 tablet (4 mg total) by mouth every 8 (eight) hours as needed for nausea or vomiting. (Patient taking differently: Take 8 mg by mouth every 8 (eight) hours as needed for nausea or vomiting.) 20 tablet 0   pantoprazole (PROTONIX) 40 MG tablet Take 1 tablet (40 mg total) by mouth daily. 30 tablet 2   polyethylene glycol (MIRALAX / GLYCOLAX) 17 g packet Take 17 g by mouth daily as needed for moderate constipation. 14 each 0   prochlorperazine (COMPAZINE) 10 MG tablet Take 1 tablet (10 mg total) by mouth every 6 (six) hours as needed (Nausea or vomiting). 30 tablet 1   SYNTHROID 112 MCG tablet Take 1 tablet (112 mcg total) by mouth daily before breakfast. 90 tablet 1   vitamin B-12 (CYANOCOBALAMIN) 1000 MCG tablet Take 1,000 mcg by mouth 2 (two) times daily.     No current facility-administered medications for this visit.    PHYSICAL EXAMINATION: ECOG PERFORMANCE STATUS: 1 - Symptomatic but completely ambulatory  Vitals:   01/04/22 1149  BP: (!) 173/93  Pulse: (!) 58   Resp: 18  Temp: 98.2 F (36.8 C)  SpO2: 100%   Wt Readings from Last 3 Encounters:  01/04/22 130 lb 6.4 oz (59.1 kg)  12/27/21 128 lb 6.4 oz (58.2 kg)  11/17/21 149 lb 0.5 oz (67.6 kg)     GENERAL:alert, no distress and comfortable SKIN: skin color normal, no rashes or significant lesions EYES: normal, Conjunctiva are pink and non-injected, sclera clear  NEURO: alert & oriented x 3 with fluent speech  LABORATORY DATA:  I have reviewed the data as listed    Latest Ref Rng & Units 01/04/2022   10:43 AM 11/22/2021    6:22 AM 11/20/2021    1:56 AM  CBC  WBC 4.0 - 10.5 K/uL 5.4   10.6   8.5    Hemoglobin 13.0 - 17.0 g/dL 9.5   9.6   8.9    Hematocrit 39.0 - 52.0 % 29.6   29.1   27.1    Platelets 150 - 400 K/uL 187   253   201          Latest Ref Rng & Units 01/04/2022   10:43 AM 11/22/2021    6:22 AM 11/20/2021    1:56 AM  CMP  Glucose 70 - 99 mg/dL 96   108   101    BUN 8 - 23 mg/dL _0 Creatinine 0.61 - 1.24 mg/dL 1.20   0.90   0.88    Sodium 135 - 145 mmol/L 139   136   138    Potassium 3.5 - 5.1 mmol/L 3.9   3.5   3.6    Chloride 98 - 111 mmol/L 105   99   103    CO2 22 - 32 mmol/L _1 Calcium 8.9 - 10.3 mg/dL 8.7   8.2   8.2    Total Protein 6.5 - 8.1 g/dL 6.7      Total Bilirubin 0.3 - 1.2 mg/dL 0.5      Alkaline Phos 38 - 126 U/L 141      AST  15 - 41 U/L 21      ALT 0 - 44 U/L 9          RADIOGRAPHIC STUDIES: I have personally reviewed the radiological images as listed and agreed with the findings in the report. No results found.    No orders of the defined types were placed in this encounter.  All questions were answered. The patient knows to call the clinic with any problems, questions or concerns. No barriers to learning was detected. The total time spent in the appointment was 30 minutes.     Angel Merle, MD 01/04/2022   I, Wilburn Mylar, am acting as scribe for Angel Merle, MD.   I have reviewed the above documentation for  accuracy and completeness, and I agree with the above.

## 2022-01-04 NOTE — Progress Notes (Signed)
Nutrition follow-up completed with patient and daughter in the exam room.  Patient diagnosed with Pancreas cancer and is status post Whipple procedure.  Weight improved to 130.4 pounds today from 128 pounds 6 ounces. Patient weighed 149 pounds 1 oz on April 5.  Labs include albumin 3.2.  Noted Creon on medication list however patient denied taking enzymes at this time and appeared to not know anything about them.  Patient diagnosed with severe malnutrition in February.  Today he reports occasional nausea, increased appetite, occasional loose stools, and dizziness.  Also complains of early satiety.  States he likes Ensure better than Equate.  Occasionally will drink Carnation breakfast essentials.  Reports drinking 2-16 ounce bottles of water daily with 8 ounces of coffee.  He is eating very small meals including something like cream of wheat at breakfast, leftovers from dinner the night before at lunchtime, and lasagna at supper.  Nutrition diagnosis: Severe malnutrition continues.  Intervention: Recommend vanilla Ensure plus or equivalent 2-3 cartons daily.  Continue Carnation breakfast essentials as desired. Work on increasing water intake to a minimum of 3-16 ounce bottles.  1 complementary case of Ensure plus high-protein provided today. Continue high-calorie, high-protein foods at meals.  Monitoring, evaluation, goals: Patient will tolerate increased calories and protein to minimize weight loss and promote weight gain.  Next visit: I will schedule a follow-up phone call for next week.  **Disclaimer: This note was dictated with voice recognition software. Similar sounding words can inadvertently be transcribed and this note may contain transcription errors which may not have been corrected upon publication of note.**

## 2022-01-05 LAB — CANCER ANTIGEN 19-9: CA 19-9: 16 U/mL (ref 0–35)

## 2022-01-06 ENCOUNTER — Telehealth: Payer: Self-pay | Admitting: Nurse Practitioner

## 2022-01-06 NOTE — Telephone Encounter (Signed)
Called Angel Keller she stated that she let him laid down get up and walk then  took BP again it was 100 /54

## 2022-01-06 NOTE — Telephone Encounter (Signed)
Awesome! Thank you!

## 2022-01-06 NOTE — Telephone Encounter (Signed)
Angel Keller with [facility] home [facility] called to report a low bp at 82/50. If you need to contact her back her number is [phone]

## 2022-01-07 ENCOUNTER — Other Ambulatory Visit: Payer: Self-pay

## 2022-01-09 ENCOUNTER — Encounter (HOSPITAL_BASED_OUTPATIENT_CLINIC_OR_DEPARTMENT_OTHER): Payer: Self-pay | Admitting: Cardiology

## 2022-01-17 ENCOUNTER — Inpatient Hospital Stay: Payer: Medicare HMO | Attending: Physician Assistant | Admitting: Nutrition

## 2022-01-17 ENCOUNTER — Telehealth: Payer: Self-pay | Admitting: Nurse Practitioner

## 2022-01-17 ENCOUNTER — Telehealth: Payer: Self-pay

## 2022-01-17 ENCOUNTER — Telehealth: Payer: Self-pay | Admitting: Nutrition

## 2022-01-17 DIAGNOSIS — C250 Malignant neoplasm of head of pancreas: Secondary | ICD-10-CM | POA: Insufficient documentation

## 2022-01-17 DIAGNOSIS — Z5111 Encounter for antineoplastic chemotherapy: Secondary | ICD-10-CM | POA: Insufficient documentation

## 2022-01-17 DIAGNOSIS — D509 Iron deficiency anemia, unspecified: Secondary | ICD-10-CM | POA: Insufficient documentation

## 2022-01-17 DIAGNOSIS — Z79899 Other long term (current) drug therapy: Secondary | ICD-10-CM | POA: Insufficient documentation

## 2022-01-17 DIAGNOSIS — D701 Agranulocytosis secondary to cancer chemotherapy: Secondary | ICD-10-CM | POA: Insufficient documentation

## 2022-01-17 NOTE — Telephone Encounter (Signed)
Called pt spoke to daughter she is advised to continued to take his cholesterol med

## 2022-01-17 NOTE — Telephone Encounter (Signed)
Completed telephone visit with daughter Lattie Haw. Patient is status post Whipple procedure on April 3 for pancreas cancer. Last weight documented was 130.4 pounds May 23.  Daughter reports patient has been eating well and feeling better.  States since beginning Creon, diarrhea has improved.  He has had 1 episode of nausea after he ate too much.  He is walking outside a little.  He continues to drink 3 bottles of Ensure Plus or equivalent daily.  He also continues an occasional Carnation breakfast essential.  Daughter reports he is increasing water at mealtimes.  Nutrition diagnosis: Severe malnutrition, ongoing.  Intervention: Continue Ensure Plus or equivalent 3 times daily between meals.  Continue Carnation breakfast essentials as desired. Continue increased water intake. Educated/enforced strategies for taking Creon properly. Daughter requesting RN contact her as she has questions about potential upcoming chemotherapy and questions about medication.  RN notified by secure chat.  Monitoring, evaluation, goals: Patient will work to increase calories and protein to minimize weight loss and promote weight gain.  Next visit: Will schedule with upcoming treatment as needed.  **Disclaimer: This note was dictated with voice recognition software. Similar sounding words can inadvertently be transcribed and this note may contain transcription errors which may not have been corrected upon publication of note.**

## 2022-01-17 NOTE — Telephone Encounter (Signed)
Spoke with pt's daughter regarding pt's EMLA cream recall and pt's f/u chemo appts.  Pt is supposed to get Gemcitabine the wk of 01/17/2022 per Dr. Ernestina Penna last office note and discharge disposition on 01/04/2022.  Pt's daughter asked if the pt could not be scheduled on Tuesday d/t her husband will be starting chemotherapy on Tuesday.  Sent a Secure Chat to infusion and the APPs to see if they could see the pt prior to chemotherapy tx.  Infusion can see the pt for infusion on Thursday around 10am but still need for a provider to see the pt.  Awaiting their response before calling pt's daughter back regarding appt dates and times.

## 2022-01-17 NOTE — Telephone Encounter (Signed)
Patients daughter, Lattie Haw called and is asking if patient still needs to be on his cholesterol medication? Please advise. [phone]

## 2022-01-19 MED FILL — Dexamethasone Sodium Phosphate Inj 100 MG/10ML: INTRAMUSCULAR | Qty: 1 | Status: AC

## 2022-01-20 ENCOUNTER — Inpatient Hospital Stay: Payer: Medicare HMO

## 2022-01-20 ENCOUNTER — Inpatient Hospital Stay (HOSPITAL_BASED_OUTPATIENT_CLINIC_OR_DEPARTMENT_OTHER): Payer: Medicare HMO | Admitting: Physician Assistant

## 2022-01-20 ENCOUNTER — Other Ambulatory Visit: Payer: Self-pay | Admitting: *Deleted

## 2022-01-20 ENCOUNTER — Other Ambulatory Visit: Payer: Self-pay

## 2022-01-20 ENCOUNTER — Telehealth: Payer: Self-pay

## 2022-01-20 VITALS — BP 144/86 | HR 70 | Temp 97.4°F | Resp 18 | Ht 70.0 in | Wt 130.4 lb

## 2022-01-20 DIAGNOSIS — D509 Iron deficiency anemia, unspecified: Secondary | ICD-10-CM

## 2022-01-20 DIAGNOSIS — L989 Disorder of the skin and subcutaneous tissue, unspecified: Secondary | ICD-10-CM | POA: Diagnosis not present

## 2022-01-20 DIAGNOSIS — C250 Malignant neoplasm of head of pancreas: Secondary | ICD-10-CM | POA: Diagnosis not present

## 2022-01-20 DIAGNOSIS — Z5111 Encounter for antineoplastic chemotherapy: Secondary | ICD-10-CM | POA: Diagnosis not present

## 2022-01-20 DIAGNOSIS — D701 Agranulocytosis secondary to cancer chemotherapy: Secondary | ICD-10-CM | POA: Diagnosis not present

## 2022-01-20 DIAGNOSIS — Z79899 Other long term (current) drug therapy: Secondary | ICD-10-CM | POA: Diagnosis not present

## 2022-01-20 DIAGNOSIS — Z95828 Presence of other vascular implants and grafts: Secondary | ICD-10-CM

## 2022-01-20 LAB — CBC WITH DIFFERENTIAL (CANCER CENTER ONLY)
Abs Immature Granulocytes: 0.01 10*3/uL (ref 0.00–0.07)
Basophils Absolute: 0 10*3/uL (ref 0.0–0.1)
Basophils Relative: 1 %
Eosinophils Absolute: 0.2 10*3/uL (ref 0.0–0.5)
Eosinophils Relative: 4 %
HCT: 30.5 % — ABNORMAL LOW (ref 39.0–52.0)
Hemoglobin: 9.7 g/dL — ABNORMAL LOW (ref 13.0–17.0)
Immature Granulocytes: 0 %
Lymphocytes Relative: 39 %
Lymphs Abs: 1.5 10*3/uL (ref 0.7–4.0)
MCH: 25.1 pg — ABNORMAL LOW (ref 26.0–34.0)
MCHC: 31.8 g/dL (ref 30.0–36.0)
MCV: 78.8 fL — ABNORMAL LOW (ref 80.0–100.0)
Monocytes Absolute: 0.4 10*3/uL (ref 0.1–1.0)
Monocytes Relative: 11 %
Neutro Abs: 1.7 10*3/uL (ref 1.7–7.7)
Neutrophils Relative %: 45 %
Platelet Count: 236 10*3/uL (ref 150–400)
RBC: 3.87 MIL/uL — ABNORMAL LOW (ref 4.22–5.81)
RDW: 14.6 % (ref 11.5–15.5)
WBC Count: 3.8 10*3/uL — ABNORMAL LOW (ref 4.0–10.5)
nRBC: 0 % (ref 0.0–0.2)

## 2022-01-20 LAB — IRON AND IRON BINDING CAPACITY (CC-WL,HP ONLY)
Iron: 25 ug/dL — ABNORMAL LOW (ref 45–182)
Saturation Ratios: 6 % — ABNORMAL LOW (ref 17.9–39.5)
TIBC: 434 ug/dL (ref 250–450)
UIBC: 409 ug/dL — ABNORMAL HIGH (ref 117–376)

## 2022-01-20 LAB — CMP (CANCER CENTER ONLY)
ALT: 11 U/L (ref 0–44)
AST: 22 U/L (ref 15–41)
Albumin: 3.5 g/dL (ref 3.5–5.0)
Alkaline Phosphatase: 118 U/L (ref 38–126)
Anion gap: 4 — ABNORMAL LOW (ref 5–15)
BUN: 17 mg/dL (ref 8–23)
CO2: 30 mmol/L (ref 22–32)
Calcium: 9.2 mg/dL (ref 8.9–10.3)
Chloride: 104 mmol/L (ref 98–111)
Creatinine: 1.09 mg/dL (ref 0.61–1.24)
GFR, Estimated: >60 mL/min (ref >60)
Glucose, Bld: 105 mg/dL — ABNORMAL HIGH (ref 70–99)
Potassium: 4 mmol/L (ref 3.5–5.1)
Sodium: 138 mmol/L (ref 135–145)
Total Bilirubin: 0.5 mg/dL (ref 0.3–1.2)
Total Protein: 6.8 g/dL (ref 6.5–8.1)

## 2022-01-20 LAB — FERRITIN: Ferritin: 9 ng/mL — ABNORMAL LOW (ref 24–336)

## 2022-01-20 MED ORDER — SODIUM CHLORIDE 0.9% FLUSH
10.0000 mL | INTRAVENOUS | Status: DC | PRN
  Administered 2022-01-20: 10 mL

## 2022-01-20 MED ORDER — SODIUM CHLORIDE 0.9 % IV SOLN
800.0000 mg/m2 | Freq: Once | INTRAVENOUS | Status: AC
  Administered 2022-01-20: 1482 mg via INTRAVENOUS
  Filled 2022-01-20: qty 38.98

## 2022-01-20 MED ORDER — HEPARIN SOD (PORK) LOCK FLUSH 100 UNIT/ML IV SOLN
500.0000 [IU] | Freq: Once | INTRAVENOUS | Status: AC | PRN
  Administered 2022-01-20: 500 [IU]

## 2022-01-20 MED ORDER — PROCHLORPERAZINE MALEATE 10 MG PO TABS
10.0000 mg | ORAL_TABLET | Freq: Once | ORAL | Status: AC
  Administered 2022-01-20: 10 mg via ORAL
  Filled 2022-01-20: qty 1

## 2022-01-20 MED ORDER — SODIUM CHLORIDE 0.9 % IV SOLN: Freq: Once | INTRAVENOUS | Status: AC

## 2022-01-20 MED ORDER — FERROUS SULFATE 325 (65 FE) MG PO TBEC: 325.0000 mg | DELAYED_RELEASE_TABLET | Freq: Every day | ORAL | 3 refills | Status: DC

## 2022-01-20 NOTE — Telephone Encounter (Signed)
-----   Message from Lincoln Brigham, PA-C sent at 01/20/2022  2:55 PM EDT ----- Can you notify patient that he has iron deficiency. I will send a prescription for oral iron that he can start to take with a source of vitamin c (orange juice)

## 2022-01-20 NOTE — Telephone Encounter (Signed)
LM for pt regarding lab results and RX information

## 2022-01-20 NOTE — Patient Instructions (Signed)
[facility]ICAL [facility]COLOG[facility]  Discharge Instructions: Thank you for choosing [facility] to provide your oncology and hematology care.   If you have a lab appointment with the Cancer Center, please go directly to the Cancer Center and check in at the registration area.   Wear comfortable clothing and clothing appropriate for easy access to any Portacath or PICC line.   We strive to give you quality time with your provider. You may need to reschedule your appointment if you arrive late (15 or more minutes).  Arriving late affects you and other patients whose appointments are after yours.  Also, if you miss three or more appointments without notifying the office, you may be dismissed from the clinic at the provider's discretion.      For prescription refill requests, have your pharmacy contact our office and allow 72 hours for refills to be completed.    Today you received the following chemotherapy and/or immunotherapy agents Gemzar      To help prevent nausea and vomiting after your treatment, we encourage you to take your nausea medication as directed.  BELOW ARE SYMPTOMS THAT SHOULD BE REPORTED IMMEDIATELY: *FEVER GREATER THAN 100.4 F (38 C) OR HIGHER *CHILLS OR SWEATING *NAUSEA AND VOMITING THAT IS NOT CONTROLLED WITH YOUR NAUSEA MEDICATION *UNUSUAL SHORTNESS OF BREATH *UNUSUAL BRUISING OR BLEEDING *URINARY PROBLEMS (pain or burning when urinating, or frequent urination) *BOWEL PROBLEMS (unusual diarrhea, constipation, pain near the anus) TENDERNESS IN MOUTH AND THROAT WITH OR WITHOUT PRESENCE OF ULCERS (sore throat, sores in mouth, or a toothache) UNUSUAL RASH, SWELLING OR PAIN  UNUSUAL VAGINAL DISCHARGE OR ITCHING   Items with * indicate a potential emergency and should be followed up as soon as possible or go to the Emergency Department if any problems should occur.  Please show the CHEMOTHERAPY ALERT CARD or IMMUNOTHERAPY ALERT CARD at check-in to the  Emergency Department and triage nurse.  Should you have questions after your visit or need to cancel or reschedule your appointment, please contact [facility]  Dept: [phone]  and follow the prompts.  Office hours are 8:00 a.m. to 4:30 p.m. Monday - Friday. Please note that voicemails left after 4:00 p.m. may not be returned until the following business day.  We are closed weekends and major holidays. You have access to a nurse at all times for urgent questions. Please call the main number to the clinic Dept: [phone] and follow the prompts.   For any non-urgent questions, you may also contact your provider using MyChart. We now offer e-Visits for anyone 18 and older to request care online for non-urgent symptoms. For details visit [email or web address].   Also download the MyChart app! Go to the app store, search "MyChart", open the app, select [facility], and log in with your MyChart username and password.  Due to Covid, a mask is required upon entering the hospital/clinic. If you do not have a mask, one will be given to you upon arrival. For doctor visits, patients may have 1 support person aged 18 or older with them. For treatment visits, patients cannot have anyone with them due to current Covid guidelines and our immunocompromised population.   

## 2022-01-20 NOTE — Progress Notes (Addendum)
[place]   [phone] [phone]   Clinic Follow up Note   Patient Care Team: Ronnell Freshwater, NP as PCP - General (Family Medicine) Buford Dresser, MD as PCP - Cardiology (Cardiology) Truitt Merle, MD as Consulting Physician (Oncology) Royston Bake, RN as Oncology Nurse Navigator (Oncology)  Date of Service:  01/20/2022  CHIEF COMPLAINT: f/u of pancreatic cancer  CURRENT TREATMENT: --Adjuvant gemcitabine, starting today 01/20/2022  SUMMARY OF ONCOLOGIC HISTORY: Oncology History Overview Note   Cancer Staging  Pancreatic cancer [facility]) Staging form: Exocrine Pancreas, AJCC 8th Edition - Clinical stage from 08/05/2021: Stage IB (cT2, cN0, cM0) - Signed by Truitt Merle, MD on 08/17/2021 Stage prefix: Initial diagnosis Total positive nodes: 0     Pancreatic mass  07/18/2021 Initial Diagnosis   Pancreatic mass   07/18/2021 Imaging   CT Abdomen Pelvis W Contrast  IMPRESSION: 1. Suspected solid mass within the proximal body of the pancreas measures 2.3 x 2.6 x 2.0 cm. There is downstream dilation of the main pancreatic duct and its branches. There is also intra and extrahepatic biliary ductal dilation. Further evaluation with MRI of the abdomen, pancreatic protocol, when clinically feasible, may be considered. 2. 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 3. Aortic atherosclerosis   07/20/2021 Procedure   DG ERCP  IMPRESSION: Nondiagnostic ERCP as above. Correlation with the operative report is advised.  - The examination was suspicious for a biliary stricture in the bile duct. - Examination was suspicious for carcinoma of the head of the pancreas. - Attempts at a cholangiogram failed.   07/22/2021 Pathology Results   CASE: [id number]   FINAL MICROSCOPIC DIAGNOSIS:  - Suspicious for malignancy  - See comment   DIAGNOSTIC COMMENTS:  There are rare cells with cytologic atypia suspicious for   adenocarcinoma.  Dr. Vic Ripper agrees.    07/22/2021 Procedure   IR INT EXT BILIARY DRAIN WITH CHOLANGIOGRAM ( IMPRESSION: 1. 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 2. Successful brush biopsy of obstructing lesion x3. 3. Successful placement of a 10 French internal/external biliary drainage catheter.   PLAN: 1. Follow bilirubin. When significant downward trend is clear, the bag should be capped. Recommend capping bag before discharge home if possible. 2. Follow-up in IR in 4-6 weeks for initial biliary tube check and exchange. If initial brush biopsies are negative, repeat biopsy could be considered at that time.   Pancreatic cancer ([place])  08/05/2021 Cancer Staging   Staging form: Exocrine Pancreas, AJCC 8th Edition - Clinical stage from 08/05/2021: Stage IB (cT2, cN0, cM0) - Signed by Truitt Merle, MD on 08/17/2021 Stage prefix: Initial diagnosis Total positive nodes: 0   08/14/2021 Initial Diagnosis   Pancreatic cancer ([place])   08/20/2021 -  Chemotherapy   Patient is on Treatment Plan : PANCREATIC Abraxane / Gemcitabine D1,8,15 q28d     11/15/2021 Cancer Staging   Staging form: Exocrine Pancreas, AJCC 8th Edition - Pathologic stage from 11/15/2021: Stage IB (ypT2, pN0, cM0) - Signed by Truitt Merle, MD on 01/03/2022 Stage prefix: Post-therapy Total positive nodes: 0 Histologic grade (G): G3 Histologic grading system: 3 grade system Residual tumor (R): R0 - None      INTERVAL HISTORY:  Angel Celeste Sr. is here for a follow up of pancreatic cancer. He was last seen by Dr. Burr Medico on 01/04/2022.  He presents to the clinic accompanied by his daughter.   Angel Keller reports that his energy levels have improved since  the last visit.  He is back to working in the yard and walks accurately.  His appetite is improved and he denies any weight loss.  He denies nausea, vomiting or abdominal pain.  His bowel habits have improved since  starting to take Creon.  He currently takes 1 capsule with each meal but has occasional episodes of diarrhea throughout the week.  He denies any evidence of bleeding.  He denies fevers, chills, night sweats, shortness of breath, chest pain or cough.  He has no other complaints.  All other systems were reviewed with the patient and are negative.  MEDICAL HISTORY:  Past Medical History:  Diagnosis Date   Aneurysm ([place])    2017   Anxiety    Arthritis    Depression    Family history of breast cancer    Family history of lung cancer    Headache    HLD (hyperlipidemia)    Hx of inguinal hernia repair    Hypertension    Hypothyroidism    Overactive bladder    Pancreatic cancer ([facility])    Pneumonia    Shingles    Sleep apnea    Patient denies   Thoracic ascending aortic aneurysm ([place])    mildly dilated 4.0cm per 08/12/21 CT   Thyroid disease    Varicose veins of both lower extremities     SURGICAL HISTORY: Past Surgical History:  Procedure Laterality Date   BILATERAL CARPAL TUNNEL RELEASE     CATARACT EXTRACTION, BILATERAL Bilateral    ELBOW SURGERY Bilateral    ENDOSCOPIC RETROGRADE CHOLANGIOPANCREATOGRAPHY (ERCP) WITH PROPOFOL N/A 07/20/2021   Procedure: ENDOSCOPIC RETROGRADE CHOLANGIOPANCREATOGRAPHY (ERCP) WITH PROPOFOL;  Surgeon: Gatha Mayer, MD;  [facility] ENDOSCOPY;  Service: Endoscopy;  Laterality: N/A;   ESOPHAGOGASTRODUODENOSCOPY (EGD) WITH PROPOFOL N/A 08/05/2021   Procedure: ESOPHAGOGASTRODUODENOSCOPY (EGD) WITH PROPOFOL;  Surgeon: Rush Landmark Telford Nab., MD;  Location: [place];  Service: Gastroenterology;  Laterality: N/A;   EUS N/A 08/05/2021   Procedure: UPPER ENDOSCOPIC ULTRASOUND (EUS) RADIAL;  Surgeon: Irving Copas., MD;  Location: [place];  Service: Gastroenterology;  Laterality: N/A;   EYE SURGERY     bilateral cataracts   FINE NEEDLE ASPIRATION  08/05/2021   Procedure: FINE NEEDLE ASPIRATION (FNA) LINEAR;  Surgeon: Irving Copas., MD;  Location: [place];  Service: Gastroenterology;;   INGUINAL HERNIA REPAIR Right 1951   IR ANGIO INTRA EXTRACRAN SEL COM CAROTID INNOMINATE BILAT MOD SED  02/20/2019   IR BILIARY STENT(S) EXISTING ACCESS INC DILATION CATH EXCHANGE  09/20/2021   IR ENDOLUMINAL BX OF BILIARY TREE  07/22/2021   IR GENERIC HISTORICAL  10/28/2016   IR RADIOLOGIST EVAL & MGMT 10/28/2016 [facility]INTER[facility] RAD   IR INT EXT BILIARY DRAIN WITH CHOLANGIOGRAM  07/22/2021   JOINT REPLACEMENT     LAPAROSCOPY N/A 11/15/2021   Procedure: LAPAROSCOPIC DIAGNOSTIC STAGING;  Surgeon: Dwan Bolt, MD;  Location: [place];  Service: General;  Laterality: N/A;   PORTACATH PLACEMENT N/A 08/19/2021   Procedure: INSERTION PORT-A-CATH;  Surgeon: Dwan Bolt, MD;  Location: [facility];  Service: General;  Laterality: N/A;  LMA   RADIOLOGY WITH ANESTHESIA N/A 10/21/2015   Procedure: RADIOLOGY WITH ANESTHESIA;  Surgeon: Luanne Bras, MD;  Location: [place];  Service: Radiology;  Laterality: N/A;   RADIOLOGY WITH ANESTHESIA N/A 02/20/2019   Procedure: [employer];  Surgeon: Luanne Bras, MD;  Location: [place];  Service: Radiology;  Laterality: N/A;   ROTATOR CUFF REPAIR Bilateral    VARICOSE VEIN SURGERY  WHIPPLE PROCEDURE N/A 11/15/2021   Procedure: WHIPPLE PROCEDURE;  Surgeon: Dwan Bolt, MD;  Location: [place];  Service: General;  Laterality: N/A;    I have reviewed the social history and family history with the patient and they are unchanged from previous note.  ALLERGIES:  has No Known Allergies.  MEDICATIONS:  Current Outpatient Medications  Medication Sig Dispense Refill   acetaminophen (TYLENOL) 500 MG tablet Take 1 tablet (500 mg total) by mouth every 6 (six) hours as needed for mild pain. 30 tablet 0   apixaban (ELIQUIS) 5 MG TABS tablet Take 1 tablet (5 mg total) by mouth 2 (two) times daily. 60 tablet 2   Cholecalciferol (VITAMIN D3) 50 MCG (2000 UT) TABS Take 2,000 Units by mouth daily.      dexamethasone (DECADRON) 4 MG tablet Take 1 tablet (4 mg total) by mouth daily. For 3-5 days after chemo for nausea and fatigue (Patient not taking: Reported on 11/05/2021) 10 tablet 0   diltiazem (CARDIZEM) 30 MG tablet Take 1 tablet (30 mg total) by mouth every 8 (eight) hours as needed (heart rate fast (more than 100 beats/min at rest)). 90 tablet 0   docusate sodium (COLACE) 100 MG capsule Take 1 capsule (100 mg total) by mouth 2 (two) times daily. (Patient taking differently: Take 100 mg by mouth 2 (two) times daily as needed.) 28 capsule 0   escitalopram (LEXAPRO) 20 MG tablet Take 1 tablet (20 mg total) by mouth daily. 90 tablet 1   famotidine (PEPCID) 20 MG tablet Take 1 tablet (20 mg total) by mouth 2 (two) times daily. 60 tablet 1   feeding supplement (ENSURE ENLIVE / ENSURE PLUS) LIQD Take 237 mLs by mouth 3 (three) times daily between meals. 237 mL 12   lipase/protease/amylase (CREON) 36000 UNITS CPEP capsule Take 1 capsule (36,000 Units total) by mouth 3 (three) times daily with meals. May also take 1 capsule (36,000 Units total) as needed (with snacks). 90 capsule 2   mirtazapine (REMERON) 7.5 MG tablet Take 1 tablet (7.5 mg total) by mouth at bedtime. 90 tablet 0   Multiple Vitamin (MULTIVITAMIN WITH MINERALS) TABS tablet Take 1 tablet by mouth daily. (Patient not taking: Reported on 11/05/2021) 30 tablet 0   ondansetron (ZOFRAN) 4 MG tablet Take 1 tablet (4 mg total) by mouth every 8 (eight) hours as needed for nausea or vomiting. (Patient taking differently: Take 8 mg by mouth every 8 (eight) hours as needed for nausea or vomiting.) 20 tablet 0   pantoprazole (PROTONIX) 40 MG tablet Take 1 tablet (40 mg total) by mouth daily. 30 tablet 2   polyethylene glycol (MIRALAX / GLYCOLAX) 17 g packet Take 17 g by mouth daily as needed for moderate constipation. 14 each 0   prochlorperazine (COMPAZINE) 10 MG tablet Take 1 tablet (10 mg total) by mouth every 6 (six) hours as needed (Nausea or  vomiting). 30 tablet 1   SYNTHROID 112 MCG tablet Take 1 tablet (112 mcg total) by mouth daily before breakfast. 90 tablet 1   vitamin B-12 (CYANOCOBALAMIN) 1000 MCG tablet Take 1,000 mcg by mouth 2 (two) times daily.     No current facility-administered medications for this visit.   Facility-Administered Medications Ordered in Other Visits  Medication Dose Route Frequency Provider Last Rate Last Admin   sodium chloride flush (NS) 0.9 % injection 10 mL  10 mL Intracatheter PRN Truitt Merle, MD   10 mL at 01/20/22 0850    PHYSICAL EXAMINATION: ECOG PERFORMANCE  STATUS: 1 - Symptomatic but completely ambulatory  There were no vitals filed for this visit.  Wt Readings from Last 3 Encounters:  01/04/22 130 lb 6.4 oz (59.1 kg)  12/27/21 128 lb 6.4 oz (58.2 kg)  11/17/21 149 lb 0.5 oz (67.6 kg)     GENERAL:alert, no distress and comfortable SKIN: skin color normal, no rashes or significant lesions EYES: normal, Conjunctiva are pink and non-injected, sclera clear  ABDOMEN: Healing midline surgical incision. No evidence of erythema or discharge.  NEURO: alert & oriented x 3 with fluent speech  LABORATORY DATA:  I have reviewed the data as listed    Latest Ref Rng & Units 01/20/2022    8:43 AM 01/04/2022   10:43 AM 11/22/2021    6:22 AM  CBC  WBC 4.0 - 10.5 K/uL 3.8  5.4  10.6   Hemoglobin 13.0 - 17.0 g/dL 9.7  9.5  9.6   Hematocrit 39.0 - 52.0 % 30.5  29.6  29.1   Platelets 150 - 400 K/uL 236  187  253         Latest Ref Rng & Units 01/04/2022   10:43 AM 11/22/2021    6:22 AM 11/20/2021    1:56 AM  CMP  Glucose 70 - 99 mg/dL 96  108  101   BUN 8 - 23 mg/dL '18  18  15   ' Creatinine 0.61 - 1.24 mg/dL 1.20  0.90  0.88   Sodium 135 - 145 mmol/L 139  136  138   Potassium 3.5 - 5.1 mmol/L 3.9  3.5  3.6   Chloride 98 - 111 mmol/L 105  99  103   CO2 22 - 32 mmol/L '31  29  29   ' Calcium 8.9 - 10.3 mg/dL 8.7  8.2  8.2   Total Protein 6.5 - 8.1 g/dL 6.7     Total Bilirubin 0.3 - 1.2 mg/dL  0.5     Alkaline Phos 38 - 126 U/L 141     AST 15 - 41 U/L 21     ALT 0 - 44 U/L 9         RADIOGRAPHIC STUDIES: I have personally reviewed the radiological images as listed and agreed with the findings in the report. No results found.    ASSESSMENT & PLAN:  Angel Keller. is a 81 y.o. male with   1. 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 EUS and pancreatic mass biopsy on 08/05/21 confirmed adenocarcinoma. -Pancreatic protocol CT scan showed 3.6 x 2.6 cm pancreatic head mass, partially involving multiple vessels, including SMV, portal vein, and hepatic artery.  This is borderline resectable. -Patient was evaluated by Dr. Zenia Resides, who recommends neoadjuvant chemotherapy, which I agree. -he began gemcitabine alone on 08/20/21. He was admitted for fever after cycle 1. He tolerated cycle 2 with gem and abraxane poorly overall with fatigue, low appetite, and a mild intermittent nausea. -due to low BP, we have held chemo after cycle 2. -restaging CT AP on 10/20/21 showed partial response to therapy.  -he proceeded to whipple procedure on 11/15/21 with Dr. Zenia Resides. 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 Margins and lymph nodes negative. -Recommended adjuvant chemotherapy with single agent gemcitabine. If he tolerates well, may consider add xeloda from cycle 2   -Presents today, 01/20/2022, to start Cycle 1, Day 1 of adjuvant gemcitabine.  2. Diarrhea, abdominal cramping and weight  loss  -secondary to pancreatic surgery -improving after starting to take creon 1 capsule with each meal per day. Advised to increase to 2 capsules for further improvement of diarrhea.   3. HTN -he is currently holding BP medication.  -BP has improved to 144/86 today.  -advised to check BP at home daily and discuss if he needs to resume BP med at his upcoming visit  with PCP   4. Microcytic anemia --Iron panel confirmed iron deficiency anemia.  --Will send ferrous sulfate 325 mg once daily with a source of vitamin C.  --Consider IV iron if iron/Hgb levels don't improve over the next few weeks.   5. Scalp lesions: --Looks like a scab that is healing but there are several other skin lesions that should be evaluated.  --Making referral to dermatology to evaluate.   PLAN --Labs today were reviewed and adequate for treatment.  WBC 3.8, Hgb 9.7, Plt 236.  --Proceed with Cycle 1, Day 1 of adjuvant Gemcitabine today --Check iron panel to evaluate for iron deficiency --RTC in next week for labs and f/u visit with Dr. Burr Medico before Cycle 1, Day 8   No orders of the defined types were placed in this encounter.   All questions were answered. The patient knows to call the clinic with any problems, questions or concerns. No barriers to learning was detected.  I have spent a total of 30 minutes minutes of face-to-face and non-face-to-face time, preparing to see the [place] a medically appropriate examination, counseling and educating the patient, ordering medications/tests, referring and communicating with other health care professionals, documenting clinical information in the electronic health record,and care coordination.   Dede Query PA-C Dept of Hematology and [place] at [facility] Phone: [phone]

## 2022-01-20 NOTE — Addendum Note (Signed)
Addended by: Dede Query T on: 01/20/2022 02:57 PM   Modules accepted: Orders

## 2022-01-21 LAB — CANCER ANTIGEN 19-9: CA 19-9: 14 U/mL (ref 0–35)

## 2022-01-26 MED FILL — Dexamethasone Sodium Phosphate Inj 100 MG/10ML: INTRAMUSCULAR | Qty: 1 | Status: AC

## 2022-01-27 ENCOUNTER — Other Ambulatory Visit: Payer: Medicare HMO

## 2022-01-27 ENCOUNTER — Other Ambulatory Visit: Payer: Self-pay

## 2022-01-27 ENCOUNTER — Inpatient Hospital Stay: Payer: Medicare HMO

## 2022-01-27 ENCOUNTER — Other Ambulatory Visit (HOSPITAL_COMMUNITY): Payer: Self-pay

## 2022-01-27 ENCOUNTER — Ambulatory Visit: Payer: Medicare HMO | Admitting: Hematology

## 2022-01-27 ENCOUNTER — Encounter: Payer: Self-pay | Admitting: Hematology

## 2022-01-27 ENCOUNTER — Inpatient Hospital Stay (HOSPITAL_BASED_OUTPATIENT_CLINIC_OR_DEPARTMENT_OTHER): Payer: Medicare HMO | Admitting: Hematology

## 2022-01-27 VITALS — BP 127/75 | HR 61 | Temp 98.4°F | Resp 18 | Ht 70.0 in | Wt 130.5 lb

## 2022-01-27 VITALS — BP 197/88 | HR 59 | Resp 18

## 2022-01-27 DIAGNOSIS — C259 Malignant neoplasm of pancreas, unspecified: Secondary | ICD-10-CM | POA: Diagnosis not present

## 2022-01-27 DIAGNOSIS — Z79899 Other long term (current) drug therapy: Secondary | ICD-10-CM | POA: Diagnosis not present

## 2022-01-27 DIAGNOSIS — D701 Agranulocytosis secondary to cancer chemotherapy: Secondary | ICD-10-CM | POA: Diagnosis not present

## 2022-01-27 DIAGNOSIS — Z5111 Encounter for antineoplastic chemotherapy: Secondary | ICD-10-CM | POA: Diagnosis not present

## 2022-01-27 DIAGNOSIS — Z95828 Presence of other vascular implants and grafts: Secondary | ICD-10-CM

## 2022-01-27 DIAGNOSIS — C250 Malignant neoplasm of head of pancreas: Secondary | ICD-10-CM

## 2022-01-27 DIAGNOSIS — D509 Iron deficiency anemia, unspecified: Secondary | ICD-10-CM | POA: Diagnosis not present

## 2022-01-27 LAB — CMP (CANCER CENTER ONLY)
ALT: 19 U/L (ref 0–44)
AST: 29 U/L (ref 15–41)
Albumin: 3.5 g/dL (ref 3.5–5.0)
Alkaline Phosphatase: 87 U/L (ref 38–126)
Anion gap: 6 (ref 5–15)
BUN: 21 mg/dL (ref 8–23)
CO2: 29 mmol/L (ref 22–32)
Calcium: 9.2 mg/dL (ref 8.9–10.3)
Chloride: 102 mmol/L (ref 98–111)
Creatinine: 1.13 mg/dL (ref 0.61–1.24)
GFR, Estimated: >60 mL/min (ref >60)
Glucose, Bld: 95 mg/dL (ref 70–99)
Potassium: 3.9 mmol/L (ref 3.5–5.1)
Sodium: 137 mmol/L (ref 135–145)
Total Bilirubin: 0.5 mg/dL (ref 0.3–1.2)
Total Protein: 6.8 g/dL (ref 6.5–8.1)

## 2022-01-27 LAB — CBC WITH DIFFERENTIAL (CANCER CENTER ONLY)
Abs Immature Granulocytes: 0.01 10*3/uL (ref 0.00–0.07)
Basophils Absolute: 0 10*3/uL (ref 0.0–0.1)
Basophils Relative: 0 %
Eosinophils Absolute: 0.1 10*3/uL (ref 0.0–0.5)
Eosinophils Relative: 2 %
HCT: 27.3 % — ABNORMAL LOW (ref 39.0–52.0)
Hemoglobin: 8.8 g/dL — ABNORMAL LOW (ref 13.0–17.0)
Immature Granulocytes: 0 %
Lymphocytes Relative: 68 %
Lymphs Abs: 1.5 10*3/uL (ref 0.7–4.0)
MCH: 24.9 pg — ABNORMAL LOW (ref 26.0–34.0)
MCHC: 32.2 g/dL (ref 30.0–36.0)
MCV: 77.3 fL — ABNORMAL LOW (ref 80.0–100.0)
Monocytes Absolute: 0.1 10*3/uL (ref 0.1–1.0)
Monocytes Relative: 4 %
Neutro Abs: 0.6 10*3/uL — ABNORMAL LOW (ref 1.7–7.7)
Neutrophils Relative %: 26 %
Platelet Count: 149 10*3/uL — ABNORMAL LOW (ref 150–400)
RBC: 3.53 MIL/uL — ABNORMAL LOW (ref 4.22–5.81)
RDW: 14.7 % (ref 11.5–15.5)
WBC Count: 2.3 10*3/uL — ABNORMAL LOW (ref 4.0–10.5)
nRBC: 0.9 % — ABNORMAL HIGH (ref 0.0–0.2)

## 2022-01-27 MED ORDER — HEPARIN SOD (PORK) LOCK FLUSH 100 UNIT/ML IV SOLN
500.0000 [IU] | Freq: Once | INTRAVENOUS | Status: AC | PRN
  Administered 2022-01-27: 500 [IU]

## 2022-01-27 MED ORDER — PANCRELIPASE (LIP-PROT-AMYL) 36000-114000 UNITS PO CPEP
ORAL_CAPSULE | ORAL | 2 refills | Status: DC
  Filled 2022-01-27: qty 90, 30d supply, fill #0
  Filled 2022-02-02: qty 90, 22d supply, fill #0

## 2022-01-27 MED ORDER — PROCHLORPERAZINE MALEATE 10 MG PO TABS
10.0000 mg | ORAL_TABLET | Freq: Once | ORAL | Status: AC
  Administered 2022-01-27: 10 mg via ORAL
  Filled 2022-01-27: qty 1

## 2022-01-27 MED ORDER — SODIUM CHLORIDE 0.9% FLUSH
10.0000 mL | INTRAVENOUS | Status: DC | PRN
  Administered 2022-01-27: 10 mL

## 2022-01-27 MED ORDER — SODIUM CHLORIDE 0.9 % IV SOLN: Freq: Once | INTRAVENOUS | Status: AC

## 2022-01-27 MED ORDER — SODIUM CHLORIDE 0.9 % IV SOLN
600.0000 mg/m2 | Freq: Once | INTRAVENOUS | Status: AC
  Administered 2022-01-27: 1102 mg via INTRAVENOUS
  Filled 2022-01-27: qty 28.98

## 2022-01-27 NOTE — Progress Notes (Signed)
Per Burr Medico MD, ok to treat with ANC 0.6.

## 2022-01-27 NOTE — Progress Notes (Signed)
[place]   [phone] [phone]   Clinic Follow up Note   Patient Care Team: Ronnell Freshwater, NP as PCP - General (Family Medicine) Buford Dresser, MD as PCP - Cardiology (Cardiology) Truitt Merle, MD as Consulting Physician (Oncology) Royston Bake, RN as Oncology Nurse Navigator (Oncology)  Date of Service:  01/27/2022  CHIEF COMPLAINT: f/u of pancreatic cancer  CURRENT THERAPY:  Gemcitabine, days 1 and 8 q21d, starting 01/20/22  ASSESSMENT & PLAN:  Angel Keller. is a 81 y.o. male with   1. 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 EUS and pancreatic mass biopsy on 08/05/21 confirmed adenocarcinoma. -Pancreatic protocol CT scan showed 3.6 x 2.6 cm pancreatic head mass, partially involving multiple vessels, including SMV, portal vein, and hepatic artery.  This is borderline resectable. -Patient was evaluated by Dr. Zenia Resides, who recommends neoadjuvant chemotherapy, which I agree. -he began gemcitabine alone on 08/20/21. He was admitted for fever after cycle 1. He tolerated cycle 2 with gem and abraxane poorly overall with fatigue, low appetite, and a mild intermittent nausea. -due to low BP, we have held chemo after cycle 2. -restaging CT AP on 10/20/21 showed partial response to therapy.  -he proceeded to whipple procedure on 11/15/21 with Dr. Zenia Resides. 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 Margins and lymph nodes negative. -he restarted adjuvant gemcitabine last week, 01/20/22. He tolerated C1D1 well, no sickness or fatigue. Will monitor. Consider adding Xeloda from cycle 2. -labs reviewed, his blood counts are down from the chemo-- WBC 2.3, hgb 8.8, plt 149k, ANC 0.6 today, will proceed treatment today with dose reduction, and adding on Udenyca on day 9 or 10.   2. Diarrhea, abdominal  cramping and weight loss  -secondary to pancreatic surgery -he started creon 01/04/22 -weight is stable lately, will monitor   3. HTN -he is holding his BP medicines during chemotherapy. -his BP is improved to 127/75 today (01/27/22)  4. Microcytic anemia -Iron panel 01/20/22 confirmed iron deficiency anemia-- ferritin 9, iron serum 25.  -prescribed ferrous sulfate 325 mg that day -hgb down to 8.8 today (01/27/22) after starting chemo last week.     PLAN -proceed with C1D8 gem today with dose reduction to 600 mg/m due to neutropenia  -will plan to give GCF-S once approved by insurance -lab, flush, and gemcitabine in 2 and 3 weeks as scheduled   No problem-specific Assessment & Plan notes found for this encounter.   SUMMARY OF ONCOLOGIC HISTORY: Oncology History Overview Note   Cancer Staging  Pancreatic cancer [facility]) Staging form: Exocrine Pancreas, AJCC 8th Edition - Clinical stage from 08/05/2021: Stage IB (cT2, cN0, cM0) - Signed by Truitt Merle, MD on 08/17/2021 Stage prefix: Initial diagnosis Total positive nodes: 0     Pancreatic mass  07/18/2021 Initial Diagnosis   Pancreatic mass   07/18/2021 Imaging   CT Abdomen Pelvis W Contrast  IMPRESSION: 1. Suspected solid mass within the proximal body of the pancreas measures 2.3 x 2.6 x 2.0 cm. There is downstream dilation of the main pancreatic duct and its branches. There is also intra and extrahepatic biliary ductal dilation. Further evaluation with MRI of the abdomen, pancreatic protocol, when clinically feasible, may be considered. 2. 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 3. Aortic atherosclerosis   07/20/2021 Procedure   DG ERCP  IMPRESSION: Nondiagnostic ERCP as above. Correlation with the  operative report is advised.  - The examination was suspicious for a biliary stricture in the bile duct. - Examination was suspicious for carcinoma of the head of the pancreas. - Attempts at a  cholangiogram failed.   07/22/2021 Pathology Results   CASE: [id number]   FINAL MICROSCOPIC DIAGNOSIS:  - Suspicious for malignancy  - See comment   DIAGNOSTIC COMMENTS:  There are rare cells with cytologic atypia suspicious for  adenocarcinoma.  Dr. Vic Ripper agrees.    07/22/2021 Procedure   IR INT EXT BILIARY DRAIN WITH CHOLANGIOGRAM ( IMPRESSION: 1. 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 2. Successful brush biopsy of obstructing lesion x3. 3. Successful placement of a 10 French internal/external biliary drainage catheter.   PLAN: 1. Follow bilirubin. When significant downward trend is clear, the bag should be capped. Recommend capping bag before discharge home if possible. 2. Follow-up in IR in 4-6 weeks for initial biliary tube check and exchange. If initial brush biopsies are negative, repeat biopsy could be considered at that time.   Pancreatic cancer ([place])  08/05/2021 Cancer Staging   Staging form: Exocrine Pancreas, AJCC 8th Edition - Clinical stage from 08/05/2021: Stage IB (cT2, cN0, cM0) - Signed by Truitt Merle, MD on 08/17/2021 Stage prefix: Initial diagnosis Total positive nodes: 0   08/14/2021 Initial Diagnosis   Pancreatic cancer ([place])   08/20/2021 -  Chemotherapy   Patient is on Treatment Plan : PANCREATIC Abraxane / Gemcitabine D1,8,15 q28d     11/15/2021 Cancer Staging   Staging form: Exocrine Pancreas, AJCC 8th Edition - Pathologic stage from 11/15/2021: Stage IB (ypT2, pN0, cM0) - Signed by Truitt Merle, MD on 01/03/2022 Stage prefix: Post-therapy Total positive nodes: 0 Histologic grade (G): G3 Histologic grading system: 3 grade system Residual tumor (R): R0 - None      INTERVAL HISTORY:  Angel Celeste Sr. is here for a follow up of pancreatic cancer. He was last seen by PA Murray Hodgkins on 01/20/22. He presents to the clinic accompanied by by his daughter. He reports he did well with his first  week of gemcitabine. He denies any sickness or fatigue.   All other systems were reviewed with the patient and are negative.  MEDICAL HISTORY:  Past Medical History:  Diagnosis Date   Aneurysm ([place])    2017   Anxiety    Arthritis    Depression    Family history of breast cancer    Family history of lung cancer    Headache    HLD (hyperlipidemia)    Hx of inguinal hernia repair    Hypertension    Hypothyroidism    Overactive bladder    Pancreatic cancer ([facility])    Pneumonia    Shingles    Sleep apnea    Patient denies   Thoracic ascending aortic aneurysm ([place])    mildly dilated 4.0cm per 08/12/21 CT   Thyroid disease    Varicose veins of both lower extremities     SURGICAL HISTORY: Past Surgical History:  Procedure Laterality Date   BILATERAL CARPAL TUNNEL RELEASE     CATARACT EXTRACTION, BILATERAL Bilateral    ELBOW SURGERY Bilateral    ENDOSCOPIC RETROGRADE CHOLANGIOPANCREATOGRAPHY (ERCP) WITH PROPOFOL N/A 07/20/2021   Procedure: ENDOSCOPIC RETROGRADE CHOLANGIOPANCREATOGRAPHY (ERCP) WITH PROPOFOL;  Surgeon: Gatha Mayer, MD;  Location: [facility] ENDOSCOPY;  Service: Endoscopy;  Laterality: N/A;   ESOPHAGOGASTRODUODENOSCOPY (EGD) WITH PROPOFOL N/A 08/05/2021   Procedure: ESOPHAGOGASTRODUODENOSCOPY (EGD) WITH PROPOFOL;  Surgeon: Irving Copas.,  MD;  Location: [place];  Service: Gastroenterology;  Laterality: N/A;   EUS N/A 08/05/2021   Procedure: UPPER ENDOSCOPIC ULTRASOUND (EUS) RADIAL;  Surgeon: Irving Copas., MD;  Location: [place];  Service: Gastroenterology;  Laterality: N/A;   EYE SURGERY     bilateral cataracts   FINE NEEDLE ASPIRATION  08/05/2021   Procedure: FINE NEEDLE ASPIRATION (FNA) LINEAR;  Surgeon: Irving Copas., MD;  Location: [place];  Service: Gastroenterology;;   INGUINAL HERNIA REPAIR Right 1951   IR ANGIO INTRA EXTRACRAN SEL COM CAROTID INNOMINATE BILAT MOD SED  02/20/2019   IR BILIARY STENT(S) EXISTING ACCESS  INC DILATION CATH EXCHANGE  09/20/2021   IR ENDOLUMINAL BX OF BILIARY TREE  07/22/2021   IR GENERIC HISTORICAL  10/28/2016   IR RADIOLOGIST EVAL & MGMT 10/28/2016 [facility]INTER[facility] RAD   IR INT EXT BILIARY DRAIN WITH CHOLANGIOGRAM  07/22/2021   JOINT REPLACEMENT     LAPAROSCOPY N/A 11/15/2021   Procedure: LAPAROSCOPIC DIAGNOSTIC STAGING;  Surgeon: Dwan Bolt, MD;  Location: [place];  Service: General;  Laterality: N/A;   PORTACATH PLACEMENT N/A 08/19/2021   Procedure: INSERTION PORT-A-CATH;  Surgeon: Dwan Bolt, MD;  Location: [facility];  Service: General;  Laterality: N/A;  LMA   RADIOLOGY WITH ANESTHESIA N/A 10/21/2015   Procedure: RADIOLOGY WITH ANESTHESIA;  Surgeon: Luanne Bras, MD;  Location: [place];  Service: Radiology;  Laterality: N/A;   RADIOLOGY WITH ANESTHESIA N/A 02/20/2019   Procedure: [employer];  Surgeon: Luanne Bras, MD;  Location: [place];  Service: Radiology;  Laterality: N/A;   ROTATOR CUFF REPAIR Bilateral    VARICOSE VEIN SURGERY     WHIPPLE PROCEDURE N/A 11/15/2021   Procedure: WHIPPLE PROCEDURE;  Surgeon: Dwan Bolt, MD;  Location: [place];  Service: General;  Laterality: N/A;    I have reviewed the social history and family history with the patient and they are unchanged from previous note.  ALLERGIES:  has No Known Allergies.  MEDICATIONS:  Current Outpatient Medications  Medication Sig Dispense Refill   acetaminophen (TYLENOL) 500 MG tablet Take 1 tablet (500 mg total) by mouth every 6 (six) hours as needed for mild pain. 30 tablet 0   apixaban (ELIQUIS) 5 MG TABS tablet Take 1 tablet (5 mg total) by mouth 2 (two) times daily. 60 tablet 2   Cholecalciferol (VITAMIN D3) 50 MCG (2000 UT) TABS Take 2,000 Units by mouth daily.     dexamethasone (DECADRON) 4 MG tablet Take 1 tablet (4 mg total) by mouth daily. For 3-5 days after chemo for nausea and fatigue (Patient not taking: Reported on 11/05/2021) 10 tablet 0   diltiazem (CARDIZEM) 30 MG tablet Take 1  tablet (30 mg total) by mouth every 8 (eight) hours as needed (heart rate fast (more than 100 beats/min at rest)). 90 tablet 0   docusate sodium (COLACE) 100 MG capsule Take 1 capsule (100 mg total) by mouth 2 (two) times daily. (Patient taking differently: Take 100 mg by mouth 2 (two) times daily as needed.) 28 capsule 0   escitalopram (LEXAPRO) 20 MG tablet Take 1 tablet (20 mg total) by mouth daily. 90 tablet 1   famotidine (PEPCID) 20 MG tablet Take 1 tablet (20 mg total) by mouth 2 (two) times daily. 60 tablet 1   feeding supplement (ENSURE ENLIVE / ENSURE PLUS) LIQD Take 237 mLs by mouth 3 (three) times daily between meals. 237 mL 12   ferrous sulfate 325 (65 FE) MG EC tablet Take 1 tablet (  325 mg total) by mouth daily with breakfast. 30 tablet 3   lipase/protease/amylase (CREON) 36000 UNITS CPEP capsule Take 1 capsule (36,000 Units total) by mouth 3 (three) times daily with meals. May also take 1 capsule (36,000 Units total) as needed (with snacks). 90 capsule 2   mirtazapine (REMERON) 7.5 MG tablet Take 1 tablet (7.5 mg total) by mouth at bedtime. 90 tablet 0   Multiple Vitamin (MULTIVITAMIN WITH MINERALS) TABS tablet Take 1 tablet by mouth daily. (Patient not taking: Reported on 11/05/2021) 30 tablet 0   ondansetron (ZOFRAN) 4 MG tablet Take 1 tablet (4 mg total) by mouth every 8 (eight) hours as needed for nausea or vomiting. (Patient taking differently: Take 8 mg by mouth every 8 (eight) hours as needed for nausea or vomiting.) 20 tablet 0   pantoprazole (PROTONIX) 40 MG tablet Take 1 tablet (40 mg total) by mouth daily. 30 tablet 2   polyethylene glycol (MIRALAX / GLYCOLAX) 17 g packet Take 17 g by mouth daily as needed for moderate constipation. 14 each 0   prochlorperazine (COMPAZINE) 10 MG tablet Take 1 tablet (10 mg total) by mouth every 6 (six) hours as needed (Nausea or vomiting). 30 tablet 1   SYNTHROID 112 MCG tablet Take 1 tablet (112 mcg total) by mouth daily before breakfast. 90  tablet 1   vitamin B-12 (CYANOCOBALAMIN) 1000 MCG tablet Take 1,000 mcg by mouth 2 (two) times daily.     No current facility-administered medications for this visit.   Facility-Administered Medications Ordered in Other Visits  Medication Dose Route Frequency Provider Last Rate Last Admin   gemcitabine (GEMZAR) 1,102 mg in sodium chloride 0.9 % 250 mL chemo infusion  600 mg/m2 (Treatment Plan Recorded) Intravenous Once Truitt Merle, MD       heparin lock flush 100 unit/mL  500 Units Intracatheter Once PRN Truitt Merle, MD       sodium chloride flush (NS) 0.9 % injection 10 mL  10 mL Intracatheter PRN Truitt Merle, MD        PHYSICAL EXAMINATION: ECOG PERFORMANCE STATUS: 1 - Symptomatic but completely ambulatory  Vitals:   01/27/22 0809  BP: 127/75  Pulse: 61  Resp: 18  Temp: 98.4 F (36.9 C)  SpO2: 99%   Wt Readings from Last 3 Encounters:  01/27/22 130 lb 8 oz (59.2 kg)  01/20/22 130 lb 6.4 oz (59.1 kg)  01/04/22 130 lb 6.4 oz (59.1 kg)     GENERAL:alert, no distress and comfortable SKIN: skin color, texture, turgor are normal, no rashes or significant lesions EYES: normal, Conjunctiva are pink and non-injected, sclera clear  LUNGS: clear to auscultation and percussion with normal breathing effort HEART: regular rate & rhythm and no murmurs and no lower extremity edema Musculoskeletal:no cyanosis of digits and no clubbing  NEURO: alert & oriented x 3 with fluent speech, no focal motor/sensory deficits  LABORATORY DATA:  I have reviewed the data as listed    Latest Ref Rng & Units 01/27/2022    7:36 AM 01/20/2022    8:43 AM 01/04/2022   10:43 AM  CBC  WBC 4.0 - 10.5 K/uL 2.3  3.8  5.4   Hemoglobin 13.0 - 17.0 g/dL 8.8  9.7  9.5   Hematocrit 39.0 - 52.0 % 27.3  30.5  29.6   Platelets 150 - 400 K/uL 149  236  187         Latest Ref Rng & Units 01/27/2022    7:36 AM 01/20/2022  8:43 AM 01/04/2022   10:43 AM  CMP  Glucose 70 - 99 mg/dL 95  105  96   BUN 8 - 23 mg/dL '21  17   18   ' Creatinine 0.61 - 1.24 mg/dL 1.13  1.09  1.20   Sodium 135 - 145 mmol/L 137  138  139   Potassium 3.5 - 5.1 mmol/L 3.9  4.0  3.9   Chloride 98 - 111 mmol/L 102  104  105   CO2 22 - 32 mmol/L '29  30  31   ' Calcium 8.9 - 10.3 mg/dL 9.2  9.2  8.7   Total Protein 6.5 - 8.1 g/dL 6.8  6.8  6.7   Total Bilirubin 0.3 - 1.2 mg/dL 0.5  0.5  0.5   Alkaline Phos 38 - 126 U/L 87  118  141   AST 15 - 41 U/L '29  22  21   ' ALT 0 - 44 U/L '19  11  9       ' RADIOGRAPHIC STUDIES: I have personally reviewed the radiological images as listed and agreed with the findings in the report. No results found.    No orders of the defined types were placed in this encounter.  All questions were answered. The patient knows to call the clinic with any problems, questions or concerns. No barriers to learning was detected. The total time spent in the appointment was 30 minutes.     Truitt Merle, MD 01/27/2022   I, Wilburn Mylar, am acting as scribe for Truitt Merle, MD.   I have reviewed the above documentation for accuracy and completeness, and I agree with the above.

## 2022-01-27 NOTE — Patient Instructions (Signed)
[facility] MEDICAL ONCOLOGY  Discharge Instructions: Thank you for choosing [facility] to provide your oncology and hematology care.   If you have a lab appointment with the Cancer Center, please go directly to the Cancer Center and check in at the registration area.   Wear comfortable clothing and clothing appropriate for easy access to any Portacath or PICC line.   We strive to give you quality time with your provider. You may need to reschedule your appointment if you arrive late (15 or more minutes).  Arriving late affects you and other patients whose appointments are after yours.  Also, if you miss three or more appointments without notifying the office, you may be dismissed from the clinic at the provider's discretion.      For prescription refill requests, have your pharmacy contact our office and allow 72 hours for refills to be completed.    Today you received the following chemotherapy and/or immunotherapy agents : Gemcitabine      To help prevent nausea and vomiting after your treatment, we encourage you to take your nausea medication as directed.  BELOW ARE SYMPTOMS THAT SHOULD BE REPORTED IMMEDIATELY: *FEVER GREATER THAN 100.4 F (38 C) OR HIGHER *CHILLS OR SWEATING *NAUSEA AND VOMITING THAT IS NOT CONTROLLED WITH YOUR NAUSEA MEDICATION *UNUSUAL SHORTNESS OF BREATH *UNUSUAL BRUISING OR BLEEDING *URINARY PROBLEMS (pain or burning when urinating, or frequent urination) *BOWEL PROBLEMS (unusual diarrhea, constipation, pain near the anus) TENDERNESS IN MOUTH AND THROAT WITH OR WITHOUT PRESENCE OF ULCERS (sore throat, sores in mouth, or a toothache) UNUSUAL RASH, SWELLING OR PAIN  UNUSUAL VAGINAL DISCHARGE OR ITCHING   Items with * indicate a potential emergency and should be followed up as soon as possible or go to the Emergency Department if any problems should occur.  Please show the CHEMOTHERAPY ALERT CARD or IMMUNOTHERAPY ALERT CARD at check-in  to the Emergency Department and triage nurse.  Should you have questions after your visit or need to cancel or reschedule your appointment, please contact [facility] MEDICAL ONCOLOGY  Dept: [phone]  and follow the prompts.  Office hours are 8:00 a.m. to 4:30 p.m. Monday - Friday. Please note that voicemails left after 4:00 p.m. may not be returned until the following business day.  We are closed weekends and major holidays. You have access to a nurse at all times for urgent questions. Please call the main number to the clinic Dept: [phone] and follow the prompts.   For any non-urgent questions, you may also contact your provider using MyChart. We now offer e-Visits for anyone 18 and older to request care online for non-urgent symptoms. For details visit [email or web address].   Also download the MyChart app! Go to the app store, search "MyChart", open the app, select [place], and log in with your MyChart username and password.  Masks are optional in the cancer centers. If you would like for your care team to wear a mask while they are taking care of you, please let them know. For doctor visits, patients may have with them one support person who is at least 82 years old. At this time, visitors are not allowed in the infusion area. 

## 2022-01-28 ENCOUNTER — Other Ambulatory Visit (HOSPITAL_COMMUNITY): Payer: Self-pay

## 2022-01-31 ENCOUNTER — Telehealth: Payer: Self-pay | Admitting: *Deleted

## 2022-01-31 ENCOUNTER — Other Ambulatory Visit (HOSPITAL_COMMUNITY): Payer: Self-pay

## 2022-01-31 NOTE — Telephone Encounter (Signed)
Daughter states copay for Creon is $ 280.00, pt cannot afford it. Any alternatives?

## 2022-02-01 ENCOUNTER — Encounter: Payer: Self-pay | Admitting: Hematology

## 2022-02-01 NOTE — Progress Notes (Signed)
Received message from provider regarding patient needing assistance with Creon.  Advised I would need to reach out to patient regarding applying for [employer] grant and there is another program which provider and patient could complete and return via fax.  I called and left patient a message with my contact name and number.

## 2022-02-01 NOTE — Progress Notes (Signed)
Patient returned my call from voicemail left.  Introduced myself as Arboriculturist and offered available resources.  Discussed one-time $1000 Radio broadcast assistant to assist with personal expenses while going through treatment. Based on verbal income guidelines, he does qualify for the grant. He will provide documentation on 6/29 at next visit(income and denial letter- if able to locate). He will also sign grant approval and be given a copy and expense sheet on that date.   Assured him he is now able to pick up his medication from pharmacy at his earliest convenience and will be paid for by the grant.  Provided my direct contact name and number for himself as well as pharmacy staff for any questions or concerns. He was very Patent attorney.

## 2022-02-01 NOTE — Progress Notes (Unsigned)
Left application for My [employer] Assist for Creon assistance for patient if needed. Advised Joy RN I will speak with patient first to determine if he can get through [employer] if qualifies or needs to go through assistance program.  She verbalized understanding.

## 2022-02-02 ENCOUNTER — Encounter: Payer: Self-pay | Admitting: Hematology

## 2022-02-02 ENCOUNTER — Other Ambulatory Visit (HOSPITAL_COMMUNITY): Payer: Self-pay

## 2022-02-03 ENCOUNTER — Other Ambulatory Visit: Payer: Self-pay

## 2022-02-03 ENCOUNTER — Encounter: Payer: Self-pay | Admitting: Nurse Practitioner

## 2022-02-03 ENCOUNTER — Other Ambulatory Visit: Payer: Self-pay | Admitting: Hematology

## 2022-02-03 ENCOUNTER — Other Ambulatory Visit (HOSPITAL_COMMUNITY): Payer: Self-pay

## 2022-02-03 ENCOUNTER — Ambulatory Visit (INDEPENDENT_AMBULATORY_CARE_PROVIDER_SITE_OTHER): Payer: Medicare HMO | Admitting: Nurse Practitioner

## 2022-02-03 VITALS — BP 163/78 | HR 61 | Temp 97.7°F | Ht 70.08 in | Wt 131.1 lb

## 2022-02-03 DIAGNOSIS — C251 Malignant neoplasm of body of pancreas: Secondary | ICD-10-CM | POA: Diagnosis not present

## 2022-02-03 DIAGNOSIS — I10 Essential (primary) hypertension: Secondary | ICD-10-CM | POA: Diagnosis not present

## 2022-02-03 DIAGNOSIS — R634 Abnormal weight loss: Secondary | ICD-10-CM | POA: Diagnosis not present

## 2022-02-03 DIAGNOSIS — K909 Intestinal malabsorption, unspecified: Secondary | ICD-10-CM | POA: Diagnosis not present

## 2022-02-03 DIAGNOSIS — I480 Paroxysmal atrial fibrillation: Secondary | ICD-10-CM | POA: Diagnosis not present

## 2022-02-03 DIAGNOSIS — R197 Diarrhea, unspecified: Secondary | ICD-10-CM | POA: Diagnosis not present

## 2022-02-03 MED ORDER — PANCRELIPASE (LIP-PROT-AMYL) 36000-114000 UNITS PO CPEP
ORAL_CAPSULE | ORAL | 2 refills | Status: DC
  Filled 2022-02-03: qty 90, fill #0

## 2022-02-03 MED ORDER — DILTIAZEM HCL 30 MG PO TABS: 30.0000 mg | ORAL_TABLET | Freq: Three times a day (TID) | ORAL | 2 refills | Status: DC

## 2022-02-03 NOTE — Progress Notes (Signed)
Established patient visit   Patient: Angel Keller.   DOB: 26-Apr-1941   81 y.o. Male  MRN: [id number] Visit Date: 02/03/2022   Chief Complaint  Patient presents with   Medication Refill   Subjective    HPI  Patient presents for follow up visit.  -recently had surgery due to pancreatic cancer. Whipple procedure done 11/15/2021.  -restarted chemotherapy 01/20/2022.  -having diarrhea post operatively. Was started on Creon per oncology team . -having trouble with racing heart and high blood pressure. He is not taking diltiazem as previously prescribed.  -does need to have suggestion for protein.  -still having decreased appetite but weight is stable. Has even gained a pound since he was last seen.   Medications: Outpatient Medications Prior to Visit  Medication Sig   acetaminophen (TYLENOL) 500 MG tablet Take 1 tablet (500 mg total) by mouth every 6 (six) hours as needed for mild pain.   apixaban (ELIQUIS) 5 MG TABS tablet Take 1 tablet (5 mg total) by mouth 2 (two) times daily.   Cholecalciferol (VITAMIN D3) 50 MCG (2000 UT) TABS Take 2,000 Units by mouth daily.   dexamethasone (DECADRON) 4 MG tablet Take 1 tablet (4 mg total) by mouth daily. For 3-5 days after chemo for nausea and fatigue (Patient not taking: Reported on 11/05/2021)   docusate sodium (COLACE) 100 MG capsule Take 1 capsule (100 mg total) by mouth 2 (two) times daily. (Patient taking differently: Take 100 mg by mouth 2 (two) times daily as needed.)   escitalopram (LEXAPRO) 20 MG tablet Take 1 tablet (20 mg total) by mouth daily.   famotidine (PEPCID) 20 MG tablet Take 1 tablet (20 mg total) by mouth 2 (two) times daily.   feeding supplement (ENSURE ENLIVE / ENSURE PLUS) LIQD Take 237 mLs by mouth 3 (three) times daily between meals.   ferrous sulfate 325 (65 FE) MG EC tablet Take 1 tablet (325 mg total) by mouth daily with breakfast.   mirtazapine (REMERON) 7.5 MG tablet Take 1 tablet (7.5 mg total) by mouth at bedtime.    Multiple Vitamin (MULTIVITAMIN WITH MINERALS) TABS tablet Take 1 tablet by mouth daily. (Patient not taking: Reported on 11/05/2021)   ondansetron (ZOFRAN) 4 MG tablet Take 1 tablet (4 mg total) by mouth every 8 (eight) hours as needed for nausea or vomiting. (Patient taking differently: Take 8 mg by mouth every 8 (eight) hours as needed for nausea or vomiting.)   pantoprazole (PROTONIX) 40 MG tablet Take 1 tablet (40 mg total) by mouth daily.   polyethylene glycol (MIRALAX / GLYCOLAX) 17 g packet Take 17 g by mouth daily as needed for moderate constipation.   prochlorperazine (COMPAZINE) 10 MG tablet Take 1 tablet (10 mg total) by mouth every 6 (six) hours as needed (Nausea or vomiting).   SYNTHROID 112 MCG tablet Take 1 tablet (112 mcg total) by mouth daily before breakfast.   vitamin B-12 (CYANOCOBALAMIN) 1000 MCG tablet Take 1,000 mcg by mouth 2 (two) times daily.   [DISCONTINUED] diltiazem (CARDIZEM) 30 MG tablet Take 1 tablet (30 mg total) by mouth every 8 (eight) hours as needed (heart rate fast (more than 100 beats/min at rest)).   [DISCONTINUED] lipase/protease/amylase (CREON) 36000 UNITS CPEP capsule Take 1 capsule (36,000 Units total) by mouth 3 (three) times daily with meals. May also take 1 capsule (36,000 Units total) as needed (with snacks).   No facility-administered medications prior to visit.    Review of Systems  Constitutional:  Negative for activity  change, chills, fatigue and fever.       Improved appetite and increased activities.   HENT:  Negative for congestion, postnasal drip, rhinorrhea, sinus pressure, sinus pain, sneezing and sore throat.   Eyes: Negative.   Respiratory:  Negative for cough, shortness of breath and wheezing.   Cardiovascular:  Positive for palpitations. Negative for chest pain.       Having palpitations and increased blood pressure   Gastrointestinal:  Positive for abdominal pain and diarrhea. Negative for constipation, nausea and vomiting.   Endocrine: Negative for cold intolerance, heat intolerance, polydipsia and polyuria.  Genitourinary:  Negative for dysuria, frequency and urgency.  Musculoskeletal:  Negative for back pain and myalgias.  Skin:  Negative for rash.  Allergic/Immunologic: Negative for environmental allergies.  Neurological:  Negative for dizziness, weakness and headaches.  Psychiatric/Behavioral:  Positive for dysphoric mood. The patient is not nervous/anxious.       Objective     Today's Vitals   02/03/22 1504  BP: (!) 163/78  Pulse: 61  Temp: 97.7 F (36.5 C)  SpO2: 98%  Weight: 131 lb 1.9 oz (59.5 kg)  Height: 5' 10.08" (1.78 m)   Body mass index is 18.77 kg/m.   BP Readings from Last 3 Encounters:  02/03/22 (!) 163/78  01/27/22 (!) 197/88  01/27/22 127/75    Wt Readings from Last 3 Encounters:  02/03/22 131 lb 1.9 oz (59.5 kg)  01/27/22 130 lb 8 oz (59.2 kg)  01/20/22 130 lb 6.4 oz (59.1 kg)    Physical Exam Vitals and nursing note reviewed.  Constitutional:      Appearance: Normal appearance. He is well-developed and underweight.  HENT:     Head: Normocephalic and atraumatic.     Nose: Nose normal.     Mouth/Throat:     Mouth: Mucous membranes are moist.     Pharynx: Oropharynx is clear.  Eyes:     Extraocular Movements: Extraocular movements intact.     Conjunctiva/sclera: Conjunctivae normal.     Pupils: Pupils are equal, round, and reactive to light.  Cardiovascular:     Rate and Rhythm: Normal rate and regular rhythm.     Pulses: Normal pulses.     Heart sounds: Normal heart sounds.  Pulmonary:     Effort: Pulmonary effort is normal.     Breath sounds: Normal breath sounds.  Abdominal:     Palpations: Abdomen is soft.  Musculoskeletal:        General: Normal range of motion.     Cervical back: Normal range of motion and neck supple.  Lymphadenopathy:     Cervical: No cervical adenopathy.  Skin:    General: Skin is warm and dry.     Capillary Refill: Capillary  refill takes less than 2 seconds.  Neurological:     General: No focal deficit present.     Mental Status: He is alert and oriented to person, place, and time.  Psychiatric:        Mood and Affect: Mood normal.        Behavior: Behavior normal.        Thought Content: Thought content normal.        Judgment: Judgment normal.      Assessment & Plan    1. Essential hypertension Restart diltiazem 30 mg three times daily to reduce blood pressure and regulate heart rate.  - diltiazem (CARDIZEM) 30 MG tablet; Take 1 tablet (30 mg total) by mouth 3 (three) times daily.  Dispense: 90  tablet; Refill: 2  2. Paroxysmal atrial fibrillation (HCC) Restart diltiazem 30 mg three times daily to reduce blood pressure and regulate heart rate.  He should conitnue eliquis 5 mg twice daily  - diltiazem (CARDIZEM) 30 MG tablet; Take 1 tablet (30 mg total) by mouth 3 (three) times daily.  Dispense: 90 tablet; Refill: 2  3. Diarrhea due to malabsorption New prescription Creon 50000 units sent to [facility] . - lipase/protease/amylase (CREON) 36000 UNITS CPEP capsule; Take 1 capsule by mouth 3 times daily with meals. May also take 1 capsule as needed (with snacks).  Dispense: 90 capsule; Refill: 2  4. Malignant neoplasm of body of pancreas ([place]) Continue Creon as prescribed per oncology. Chemotherapy and continued treatment through oncology  - lipase/protease/amylase (CREON) 36000 UNITS CPEP capsule; Take 1 capsule by mouth 3 times daily with meals. May also take 1 capsule as needed (with snacks).  Dispense: 90 capsule; Refill: 2  5. Abnormal weight loss Stable. With one pound weight gain since last visit. Continue protein shake meal supplementation. Recommend Muscle Milk which may taste better.    Problem List Items Addressed This Visit       Cardiovascular and Mediastinum   Essential hypertension - Primary   Relevant Medications   diltiazem (CARDIZEM) 30 MG tablet   Paroxysmal atrial  fibrillation (HCC)   Relevant Medications   diltiazem (CARDIZEM) 30 MG tablet     Digestive   Pancreatic cancer (HCC)   Relevant Medications   lipase/protease/amylase (CREON) 36000 UNITS CPEP capsule   Diarrhea due to malabsorption   Relevant Medications   lipase/protease/amylase (CREON) 36000 UNITS CPEP capsule     Other   Abnormal weight loss     Return in about 3 months (around 05/06/2022) for blood pressure.         Ronnell Freshwater, NP  [facility] [phone] (phone) [phone] (fax)  [place]

## 2022-02-08 ENCOUNTER — Ambulatory Visit: Payer: Medicare HMO | Admitting: Hematology

## 2022-02-08 ENCOUNTER — Other Ambulatory Visit: Payer: Medicare HMO

## 2022-02-08 ENCOUNTER — Ambulatory Visit: Payer: Medicare HMO

## 2022-02-08 ENCOUNTER — Encounter: Payer: Medicare HMO | Admitting: Nutrition

## 2022-02-09 MED FILL — Dexamethasone Sodium Phosphate Inj 100 MG/10ML: INTRAMUSCULAR | Qty: 1 | Status: AC

## 2022-02-10 ENCOUNTER — Inpatient Hospital Stay: Payer: Medicare HMO | Admitting: Nutrition

## 2022-02-10 ENCOUNTER — Inpatient Hospital Stay: Payer: Medicare HMO

## 2022-02-10 ENCOUNTER — Inpatient Hospital Stay (HOSPITAL_BASED_OUTPATIENT_CLINIC_OR_DEPARTMENT_OTHER): Payer: Medicare HMO | Admitting: Hematology

## 2022-02-10 ENCOUNTER — Other Ambulatory Visit: Payer: Self-pay

## 2022-02-10 ENCOUNTER — Encounter: Payer: Self-pay | Admitting: Hematology

## 2022-02-10 VITALS — BP 148/81 | HR 67 | Temp 97.6°F | Resp 18 | Ht 70.8 in | Wt 131.7 lb

## 2022-02-10 DIAGNOSIS — C250 Malignant neoplasm of head of pancreas: Secondary | ICD-10-CM | POA: Diagnosis not present

## 2022-02-10 DIAGNOSIS — D701 Agranulocytosis secondary to cancer chemotherapy: Secondary | ICD-10-CM | POA: Diagnosis not present

## 2022-02-10 DIAGNOSIS — C251 Malignant neoplasm of body of pancreas: Secondary | ICD-10-CM

## 2022-02-10 DIAGNOSIS — Z79899 Other long term (current) drug therapy: Secondary | ICD-10-CM | POA: Diagnosis not present

## 2022-02-10 DIAGNOSIS — Z5111 Encounter for antineoplastic chemotherapy: Secondary | ICD-10-CM | POA: Diagnosis not present

## 2022-02-10 DIAGNOSIS — D509 Iron deficiency anemia, unspecified: Secondary | ICD-10-CM | POA: Diagnosis not present

## 2022-02-10 DIAGNOSIS — Z95828 Presence of other vascular implants and grafts: Secondary | ICD-10-CM

## 2022-02-10 LAB — CMP (CANCER CENTER ONLY)
ALT: 12 U/L (ref 0–44)
AST: 21 U/L (ref 15–41)
Albumin: 3.5 g/dL (ref 3.5–5.0)
Alkaline Phosphatase: 67 U/L (ref 38–126)
Anion gap: 4 — ABNORMAL LOW (ref 5–15)
BUN: 19 mg/dL (ref 8–23)
CO2: 30 mmol/L (ref 22–32)
Calcium: 9.1 mg/dL (ref 8.9–10.3)
Chloride: 104 mmol/L (ref 98–111)
Creatinine: 1.19 mg/dL (ref 0.61–1.24)
GFR, Estimated: >60 mL/min (ref >60)
Glucose, Bld: 105 mg/dL — ABNORMAL HIGH (ref 70–99)
Potassium: 4.1 mmol/L (ref 3.5–5.1)
Sodium: 138 mmol/L (ref 135–145)
Total Bilirubin: 0.4 mg/dL (ref 0.3–1.2)
Total Protein: 6.5 g/dL (ref 6.5–8.1)

## 2022-02-10 LAB — CBC WITH DIFFERENTIAL (CANCER CENTER ONLY)
Abs Immature Granulocytes: 0.01 10*3/uL (ref 0.00–0.07)
Basophils Absolute: 0 10*3/uL (ref 0.0–0.1)
Basophils Relative: 1 %
Eosinophils Absolute: 0.1 10*3/uL (ref 0.0–0.5)
Eosinophils Relative: 2 %
HCT: 26.2 % — ABNORMAL LOW (ref 39.0–52.0)
Hemoglobin: 8.5 g/dL — ABNORMAL LOW (ref 13.0–17.0)
Immature Granulocytes: 0 %
Lymphocytes Relative: 41 %
Lymphs Abs: 1.2 10*3/uL (ref 0.7–4.0)
MCH: 25.4 pg — ABNORMAL LOW (ref 26.0–34.0)
MCHC: 32.4 g/dL (ref 30.0–36.0)
MCV: 78.2 fL — ABNORMAL LOW (ref 80.0–100.0)
Monocytes Absolute: 0.4 10*3/uL (ref 0.1–1.0)
Monocytes Relative: 13 %
Neutro Abs: 1.2 10*3/uL — ABNORMAL LOW (ref 1.7–7.7)
Neutrophils Relative %: 43 %
Platelet Count: 337 10*3/uL (ref 150–400)
RBC: 3.35 MIL/uL — ABNORMAL LOW (ref 4.22–5.81)
RDW: 16.9 % — ABNORMAL HIGH (ref 11.5–15.5)
WBC Count: 2.9 10*3/uL — ABNORMAL LOW (ref 4.0–10.5)
nRBC: 0 % (ref 0.0–0.2)

## 2022-02-10 MED ORDER — DEXAMETHASONE 4 MG PO TABS: 4.0000 mg | ORAL_TABLET | Freq: Every day | ORAL | 0 refills | Status: DC

## 2022-02-10 MED ORDER — SODIUM CHLORIDE 0.9% FLUSH
10.0000 mL | INTRAVENOUS | Status: DC | PRN
  Administered 2022-02-10: 10 mL

## 2022-02-10 MED ORDER — SODIUM CHLORIDE 0.9 % IV SOLN
800.0000 mg/m2 | Freq: Once | INTRAVENOUS | Status: AC
  Administered 2022-02-10: 1482 mg via INTRAVENOUS
  Filled 2022-02-10: qty 38.98

## 2022-02-10 MED ORDER — SODIUM CHLORIDE 0.9 % IV SOLN: Freq: Once | INTRAVENOUS | Status: AC

## 2022-02-10 MED ORDER — HEPARIN SOD (PORK) LOCK FLUSH 100 UNIT/ML IV SOLN
500.0000 [IU] | Freq: Once | INTRAVENOUS | Status: AC | PRN
  Administered 2022-02-10: 500 [IU]

## 2022-02-10 MED ORDER — PROCHLORPERAZINE MALEATE 10 MG PO TABS
10.0000 mg | ORAL_TABLET | Freq: Once | ORAL | Status: AC
  Administered 2022-02-10: 10 mg via ORAL
  Filled 2022-02-10: qty 1

## 2022-02-10 NOTE — Progress Notes (Signed)
[place]   [phone] [phone]   Clinic Follow up Note   Patient Care Team: Ronnell Freshwater, NP as PCP - General (Family Medicine) Buford Dresser, MD as PCP - Cardiology (Cardiology) Truitt Merle, MD as Consulting Physician (Oncology) Royston Bake, RN as Oncology Nurse Navigator (Oncology)  Date of Service:  02/10/2022  CHIEF COMPLAINT: f/u of pancreatic cancer  CURRENT THERAPY:  Gemcitabine, days 1 and 8 q21d, starting 01/20/22  ASSESSMENT & PLAN:  Angel Engram. is a 81 y.o. male with   1. 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 EUS and pancreatic mass biopsy on 08/05/21 confirmed adenocarcinoma. -Pancreatic protocol CT scan showed 3.6 x 2.6 cm pancreatic head mass, partially involving multiple vessels, including SMV, portal vein, and hepatic artery.  This is borderline resectable. -Patient was evaluated by Dr. Zenia Resides, who recommends neoadjuvant chemotherapy, which I agree. -he began gemcitabine alone on 08/20/21. He was admitted for fever after cycle 1. He tolerated cycle 2 with gem and abraxane poorly overall with fatigue, low appetite, and a mild intermittent nausea. -due to low BP, we have held chemo after cycle 2. -restaging CT AP on 10/20/21 showed partial response to therapy.  -he proceeded to whipple procedure on 11/15/21 with Dr. Zenia Resides. 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 Margins and lymph nodes negative. -he restarted adjuvant gemcitabine last week, 01/20/22. He tolerated well, no sickness or fatigue. Will monitor.  -labs reviewed, his blood counts are down from the chemo-- WBC 2.9, hgb 8.5, ANC 1.2 today. Since they have not recovered adequately between cycles, we will plan for GCF-S injection on day 10. -due to his advanced age and neutropenia from gemcitaibne, I do not  think he can tolerate combination of gemcitabine and xeloda, will let him finish 5 month adjuvant Gemcitabine alone (7 cycles)    2. Diarrhea, abdominal cramping and weight loss  -secondary to pancreatic surgery -he started creon 01/04/22 -weight is stable lately, will monitor   3. HTN -he is holding his BP medicines during chemotherapy.   4. Microcytic anemia -Iron panel 01/20/22 confirmed iron deficiency anemia-- ferritin 9, iron serum 25.  -prescribed ferrous sulfate 325 mg that day -hgb down to 8.5 today (02/10/22)     PLAN -proceed with C2D1 gem today and in 1 week             -plan to give GCF-S on day 10 -lab, flush, and gemcitabine in 3 and 4 weeks  -f/u in 3 weeks   No problem-specific Assessment & Plan notes found for this encounter.   SUMMARY OF ONCOLOGIC HISTORY: Oncology History Overview Note   Cancer Staging  Pancreatic cancer [facility]) Staging form: Exocrine Pancreas, AJCC 8th Edition - Clinical stage from 08/05/2021: Stage IB (cT2, cN0, cM0) - Signed by Truitt Merle, MD on 08/17/2021 Stage prefix: Initial diagnosis Total positive nodes: 0     Pancreatic mass  07/18/2021 Initial Diagnosis   Pancreatic mass   07/18/2021 Imaging   CT Abdomen Pelvis W Contrast  IMPRESSION: 1. Suspected solid mass within the proximal body of the pancreas measures 2.3 x 2.6 x 2.0 cm. There is downstream dilation of the main pancreatic duct and its branches. There is also intra and extrahepatic biliary ductal dilation. Further evaluation with MRI of the abdomen, pancreatic protocol, when clinically feasible, may be considered. 2. 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 3. Aortic atherosclerosis   07/20/2021 Procedure   DG ERCP  IMPRESSION: Nondiagnostic ERCP as above. Correlation with the operative report is advised.  - The examination was suspicious for a biliary stricture in the bile duct. - Examination was suspicious for carcinoma of the head of the  pancreas. - Attempts at a cholangiogram failed.   07/22/2021 Pathology Results   CASE: [id number]   FINAL MICROSCOPIC DIAGNOSIS:  - Suspicious for malignancy  - See comment   DIAGNOSTIC COMMENTS:  There are rare cells with cytologic atypia suspicious for  adenocarcinoma.  Dr. Vic Ripper agrees.    07/22/2021 Procedure   IR INT EXT BILIARY DRAIN WITH CHOLANGIOGRAM ( IMPRESSION: 1. 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 2. Successful brush biopsy of obstructing lesion x3. 3. Successful placement of a 10 French internal/external biliary drainage catheter.   PLAN: 1. Follow bilirubin. When significant downward trend is clear, the bag should be capped. Recommend capping bag before discharge home if possible. 2. Follow-up in IR in 4-6 weeks for initial biliary tube check and exchange. If initial brush biopsies are negative, repeat biopsy could be considered at that time.   Pancreatic cancer ([place])  08/05/2021 Cancer Staging   Staging form: Exocrine Pancreas, AJCC 8th Edition - Clinical stage from 08/05/2021: Stage IB (cT2, cN0, cM0) - Signed by Truitt Merle, MD on 08/17/2021 Stage prefix: Initial diagnosis Total positive nodes: 0   08/14/2021 Initial Diagnosis   Pancreatic cancer ([place])   08/20/2021 -  Chemotherapy   Patient is on Treatment Plan : PANCREATIC Abraxane / Gemcitabine D1,8 q21d     11/15/2021 Cancer Staging   Staging form: Exocrine Pancreas, AJCC 8th Edition - Pathologic stage from 11/15/2021: Stage IB (ypT2, pN0, cM0) - Signed by Truitt Merle, MD on 01/03/2022 Stage prefix: Post-therapy Total positive nodes: 0 Histologic grade (G): G3 Histologic grading system: 3 grade system Residual tumor (R): R0 - None      INTERVAL HISTORY:  Angel Celeste Sr. is here for a follow up of pancreatic cancer. He was last seen by me on 01/27/22. He presents to the clinic accompanied by his daughter. He reports his appetite  is returning, and he is experiencing "hunger pains" again. They report they are walking often. He notes he wants to go back to the gym but is cautious given dizziness.   All other systems were reviewed with the patient and are negative.  MEDICAL HISTORY:  Past Medical History:  Diagnosis Date   Aneurysm ([place])    2017   Anxiety    Arthritis    Depression    Family history of breast cancer    Family history of lung cancer    Headache    HLD (hyperlipidemia)    Hx of inguinal hernia repair    Hypertension    Hypothyroidism    Overactive bladder    Pancreatic cancer (HCC)    Pneumonia    Shingles    Sleep apnea    Patient denies   Thoracic ascending aortic aneurysm ([place])    mildly dilated 4.0cm per 08/12/21 CT   Thyroid disease    Varicose veins of both lower extremities     SURGICAL HISTORY: Past Surgical History:  Procedure Laterality Date   BILATERAL CARPAL TUNNEL RELEASE     CATARACT EXTRACTION, BILATERAL Bilateral    ELBOW SURGERY Bilateral    ENDOSCOPIC RETROGRADE CHOLANGIOPANCREATOGRAPHY (ERCP) WITH PROPOFOL N/A 07/20/2021   Procedure: ENDOSCOPIC RETROGRADE CHOLANGIOPANCREATOGRAPHY (ERCP) WITH PROPOFOL;  Surgeon: Gatha Mayer, MD;  Location: [facility] ENDOSCOPY;  Service: Endoscopy;  Laterality: N/A;   ESOPHAGOGASTRODUODENOSCOPY (EGD) WITH PROPOFOL N/A 08/05/2021   Procedure: ESOPHAGOGASTRODUODENOSCOPY (EGD) WITH PROPOFOL;  Surgeon: Rush Landmark Telford Nab., MD;  Location: [place];  Service: Gastroenterology;  Laterality: N/A;   EUS N/A 08/05/2021   Procedure: UPPER ENDOSCOPIC ULTRASOUND (EUS) RADIAL;  Surgeon: Irving Copas., MD;  Location: [place];  Service: Gastroenterology;  Laterality: N/A;   EYE SURGERY     bilateral cataracts   FINE NEEDLE ASPIRATION  08/05/2021   Procedure: FINE NEEDLE ASPIRATION (FNA) LINEAR;  Surgeon: Irving Copas., MD;  Location: [place];  Service: Gastroenterology;;   INGUINAL HERNIA REPAIR Right 1951   IR  ANGIO INTRA EXTRACRAN SEL COM CAROTID INNOMINATE BILAT MOD SED  02/20/2019   IR BILIARY STENT(S) EXISTING ACCESS INC DILATION CATH EXCHANGE  09/20/2021   IR ENDOLUMINAL BX OF BILIARY TREE  07/22/2021   IR GENERIC HISTORICAL  10/28/2016   IR RADIOLOGIST EVAL & MGMT 10/28/2016 [facility]INTER[facility]   IR INT EXT BILIARY DRAIN WITH CHOLANGIOGRAM  07/22/2021   JOINT REPLACEMENT     LAPAROSCOPY N/A 11/15/2021   Procedure: LAPAROSCOPIC DIAGNOSTIC STAGING;  Surgeon: Dwan Bolt, MD;  Location: [place];  Service: General;  Laterality: N/A;   PORTACATH PLACEMENT N/A 08/19/2021   Procedure: INSERTION PORT-A-CATH;  Surgeon: Dwan Bolt, MD;  Location: [facility];  Service: General;  Laterality: N/A;  LMA   RADIOLOGY WITH ANESTHESIA N/A 10/21/2015   Procedure: RADIOLOGY WITH ANESTHESIA;  Surgeon: Luanne Bras, MD;  Location: [place];  Service: Radiology;  Laterality: N/A;   RADIOLOGY WITH ANESTHESIA N/A 02/20/2019   Procedure: [employer];  Surgeon: Luanne Bras, MD;  Location: [place];  Service: Radiology;  Laterality: N/A;   ROTATOR CUFF REPAIR Bilateral    VARICOSE VEIN SURGERY     WHIPPLE PROCEDURE N/A 11/15/2021   Procedure: WHIPPLE PROCEDURE;  Surgeon: Dwan Bolt, MD;  Location: [place];  Service: General;  Laterality: N/A;    I have reviewed the social history and family history with the patient and they are unchanged from previous note.  ALLERGIES:  has No Known Allergies.  MEDICATIONS:  Current Outpatient Medications  Medication Sig Dispense Refill   acetaminophen (TYLENOL) 500 MG tablet Take 1 tablet (500 mg total) by mouth every 6 (six) hours as needed for mild pain. 30 tablet 0   apixaban (ELIQUIS) 5 MG TABS tablet Take 1 tablet (5 mg total) by mouth 2 (two) times daily. 60 tablet 2   Cholecalciferol (VITAMIN D3) 50 MCG (2000 UT) TABS Take 2,000 Units by mouth daily.     dexamethasone (DECADRON) 4 MG tablet Take 1 tablet (4 mg total) by mouth daily. For 3-5 days after chemo for nausea  and fatigue 15 tablet 0   diltiazem (CARDIZEM) 30 MG tablet Take 1 tablet (30 mg total) by mouth 3 (three) times daily. 90 tablet 2   docusate sodium (COLACE) 100 MG capsule Take 1 capsule (100 mg total) by mouth 2 (two) times daily. (Patient taking differently: Take 100 mg by mouth 2 (two) times daily as needed.) 28 capsule 0   escitalopram (LEXAPRO) 20 MG tablet Take 1 tablet (20 mg total) by mouth daily. 90 tablet 1   famotidine (PEPCID) 20 MG tablet Take 1 tablet (20 mg total) by mouth 2 (two) times daily. 60 tablet 1   feeding supplement (ENSURE ENLIVE / ENSURE PLUS) LIQD Take 237 mLs by mouth 3 (three) times daily  between meals. 237 mL 12   ferrous sulfate 325 (65 FE) MG EC tablet Take 1 tablet (325 mg total) by mouth daily with breakfast. 30 tablet 3   lipase/protease/amylase (CREON) 36000 UNITS CPEP capsule Take 1 capsule by mouth 3 times daily with meals. May also take 1 capsule as needed (with snacks). 90 capsule 2   mirtazapine (REMERON) 7.5 MG tablet Take 1 tablet (7.5 mg total) by mouth at bedtime. 90 tablet 0   Multiple Vitamin (MULTIVITAMIN WITH MINERALS) TABS tablet Take 1 tablet by mouth daily. (Patient not taking: Reported on 11/05/2021) 30 tablet 0   ondansetron (ZOFRAN) 4 MG tablet Take 1 tablet (4 mg total) by mouth every 8 (eight) hours as needed for nausea or vomiting. (Patient taking differently: Take 8 mg by mouth every 8 (eight) hours as needed for nausea or vomiting.) 20 tablet 0   pantoprazole (PROTONIX) 40 MG tablet Take 1 tablet (40 mg total) by mouth daily. 30 tablet 2   polyethylene glycol (MIRALAX / GLYCOLAX) 17 g packet Take 17 g by mouth daily as needed for moderate constipation. 14 each 0   prochlorperazine (COMPAZINE) 10 MG tablet Take 1 tablet (10 mg total) by mouth every 6 (six) hours as needed (Nausea or vomiting). 30 tablet 1   SYNTHROID 112 MCG tablet Take 1 tablet (112 mcg total) by mouth daily before breakfast. 90 tablet 1   vitamin B-12 (CYANOCOBALAMIN) 1000  MCG tablet Take 1,000 mcg by mouth 2 (two) times daily.     No current facility-administered medications for this visit.   Facility-Administered Medications Ordered in Other Visits  Medication Dose Route Frequency Provider Last Rate Last Admin   heparin lock flush 100 unit/mL  500 Units Intracatheter Once PRN Truitt Merle, MD       sodium chloride flush (NS) 0.9 % injection 10 mL  10 mL Intracatheter PRN Truitt Merle, MD        PHYSICAL EXAMINATION: ECOG PERFORMANCE STATUS: 1 - Symptomatic but completely ambulatory  Vitals:   02/10/22 1106  BP: (!) 148/81  Pulse: 67  Resp: 18  Temp: 97.6 F (36.4 C)  SpO2: 99%   Wt Readings from Last 3 Encounters:  02/10/22 131 lb 11.2 oz (59.7 kg)  02/03/22 131 lb 1.9 oz (59.5 kg)  01/27/22 130 lb 8 oz (59.2 kg)     GENERAL:alert, no distress and comfortable SKIN: skin color normal, no rashes or significant lesions EYES: normal, Conjunctiva are pink and non-injected, sclera clear  NEURO: alert & oriented x 3 with fluent speech  LABORATORY DATA:  I have reviewed the data as listed    Latest Ref Rng & Units 02/10/2022   10:42 AM 01/27/2022    7:36 AM 01/20/2022    8:43 AM  CBC  WBC 4.0 - 10.5 K/uL 2.9  2.3  3.8   Hemoglobin 13.0 - 17.0 g/dL 8.5  8.8  9.7   Hematocrit 39.0 - 52.0 % 26.2  27.3  30.5   Platelets 150 - 400 K/uL 337  149  236         Latest Ref Rng & Units 02/10/2022   10:42 AM 01/27/2022    7:36 AM 01/20/2022    8:43 AM  CMP  Glucose 70 - 99 mg/dL 105  95  105   BUN 8 - 23 mg/dL '19  21  17   ' Creatinine 0.61 - 1.24 mg/dL 1.19  1.13  1.09   Sodium 135 - 145 mmol/L 138  137  138   Potassium 3.5 - 5.1 mmol/L 4.1  3.9  4.0   Chloride 98 - 111 mmol/L 104  102  104   CO2 22 - 32 mmol/L '30  29  30   ' Calcium 8.9 - 10.3 mg/dL 9.1  9.2  9.2   Total Protein 6.5 - 8.1 g/dL 6.5  6.8  6.8   Total Bilirubin 0.3 - 1.2 mg/dL 0.4  0.5  0.5   Alkaline Phos 38 - 126 U/L 67  87  118   AST 15 - 41 U/L '21  29  22   ' ALT 0 - 44 U/L '12  19  11        ' RADIOGRAPHIC STUDIES: I have personally reviewed the radiological images as listed and agreed with the findings in the report. No results found.    No orders of the defined types were placed in this encounter.  All questions were answered. The patient knows to call the clinic with any problems, questions or concerns. No barriers to learning was detected. The total time spent in the appointment was 30 minutes.     Truitt Merle, MD 02/10/2022   I, Wilburn Mylar, am acting as scribe for Truitt Merle, MD.   I have reviewed the above documentation for accuracy and completeness, and I agree with the above.

## 2022-02-10 NOTE — Progress Notes (Signed)
Nutrition follow-up completed with patient during infusion for pancreas cancer.  Patient is status post Whipple procedure on April 3.  Weight documented as 131 pounds 11.2 ounces today relatively stable from 131 pounds 1.9 ounces June 22. Patient reports his appetite has improved. He reports he is feeling hungry now. He denies nausea and vomiting.   Reports he does not like Ensure or boost. He thinks he enjoys Carnation breakfast essentials. Noted Creon prescribed.  Patient denies diarrhea.  Nutrition diagnosis: Severe malnutrition, ongoing.  Intervention: Try Carnation breakfast essentials high-protein mixed with whole milk.   Encouraged him to mix Ensure Plus with Carnation breakfast and milk to improve taste.  Patient verbalizes this is a good idea. Reviewed other opportunities to increase calories and protein.  Monitoring, evaluation, goals: Patient will tolerate adequate calories and protein to minimize weight loss.  Next visit: To be scheduled as needed.  Daughter has contact information for questions.  **Disclaimer: This note was dictated with voice recognition software. Similar sounding words can inadvertently be transcribed and this note may contain transcription errors which may not have been corrected upon publication of note.**

## 2022-02-10 NOTE — Progress Notes (Signed)
Per MD dose is '800mg'$ /m2 and she added D10 growth factor

## 2022-02-10 NOTE — Patient Instructions (Signed)
[facility]COL[facility]  Discharge Instructions: Thank you for choosing [facility] to provide your oncology and hematology care.   If you have a lab appointment with the [facility], please go directly to the [facility] and check in at the registration area.   Wear comfortable clothing and clothing appropriate for easy access to any Portacath or PICC line.   We strive to give you quality time with your provider. You may need to reschedule your appointment if you arrive late (15 or more minutes).  Arriving late affects you and other patients whose appointments are after yours.  Also, if you miss three or more appointments without notifying the office, you may be dismissed from the clinic at the provider's discretion.      For prescription refill requests, have your pharmacy contact our office and allow 72 hours for refills to be completed.    Today you received the following chemotherapy and/or immunotherapy agent: gemzar      To help prevent nausea and vomiting after your treatment, we encourage you to take your nausea medication as directed.  BELOW ARE SYMPTOMS THAT SHOULD BE REPORTED IMMEDIATELY: *FEVER GREATER THAN 100.4 F (38 C) OR HIGHER *CHILLS OR SWEATING *NAUSEA AND VOMITING THAT IS NOT CONTROLLED WITH YOUR NAUSEA MEDICATION *UNUSUAL SHORTNESS OF BREATH *UNUSUAL BRUISING OR BLEEDING *URINARY PROBLEMS (pain or burning when urinating, or frequent urination) *BOWEL PROBLEMS (unusual diarrhea, constipation, pain near the anus) TENDERNESS IN MOUTH AND THROAT WITH OR WITHOUT PRESENCE OF ULCERS (sore throat, sores in mouth, or a toothache) UNUSUAL RASH, SWELLING OR PAIN  UNUSUAL VAGINAL DISCHARGE OR ITCHING   Items with * indicate a potential emergency and should be followed up as soon as possible or go to the Emergency Department if any problems should occur.  Please show the CHEMOTHERAPY ALERT CARD or IMMUNOTHERAPY ALERT CARD at check-in to the  Emergency Department and triage nurse.  Should you have questions after your visit or need to cancel or reschedule your appointment, please contact [facility]  Dept: [phone]  and follow the prompts.  Office hours are 8:00 a.m. to 4:30 p.m. Monday - Friday. Please note that voicemails left after 4:00 p.m. may not be returned until the following business day.  We are closed weekends and major holidays. You have access to a nurse at all times for urgent questions. Please call the main number to the clinic Dept: [phone] and follow the prompts.   For any non-urgent questions, you may also contact your provider using MyChart. We now offer e-Visits for anyone 75 and older to request care online for non-urgent symptoms. For details visit [email or web address].   Also download the MyChart app! Go to the app store, search "MyChart", open the app, select [facility], and log in with your MyChart username and password.  Masks are optional in the cancer centers. If you would like for your care team to wear a mask while they are taking care of you, please let them know. For doctor visits, patients may have with them one support person who is at least 82 years old. At this time, visitors are not allowed in the infusion area.

## 2022-02-11 ENCOUNTER — Encounter: Payer: Self-pay | Admitting: Hematology

## 2022-02-11 LAB — CANCER ANTIGEN 19-9: CA 19-9: 12 U/mL (ref 0–35)

## 2022-02-11 NOTE — Progress Notes (Signed)
Patient's daughter left voicemail regarding bills.  Called back to discuss and advised to contact billing to discuss billing arrangements. Advised if copay assistance becomes available for his treatment/treatment drugs, I will enroll him and advise with their permission. She verbalized understanding.  Also, reminded to provide proof of income to registration staff for the one-time $1000 [employer] grant to assist with transportation and personal household expenses while going through treatment. They will provide grant paperwork to sign and provide a copy of the approval and expense sheet. Advised to contact me any time after appointment to discuss grant in details. She verbalized understanding and has my card for any additional financial questions or concerns.

## 2022-02-11 NOTE — Progress Notes (Signed)
Applied to waitlist for copay assistance through [employer]. Notification will be sent if and when funds become available.

## 2022-02-14 MED FILL — Dexamethasone Sodium Phosphate Inj 100 MG/10ML: INTRAMUSCULAR | Qty: 1 | Status: AC

## 2022-02-16 ENCOUNTER — Telehealth: Payer: Self-pay | Admitting: Hematology

## 2022-02-16 ENCOUNTER — Other Ambulatory Visit: Payer: Self-pay

## 2022-02-16 ENCOUNTER — Encounter: Payer: Medicare HMO | Admitting: Nutrition

## 2022-02-16 ENCOUNTER — Inpatient Hospital Stay: Payer: Medicare HMO | Attending: Physician Assistant

## 2022-02-16 ENCOUNTER — Other Ambulatory Visit: Payer: Medicare HMO

## 2022-02-16 ENCOUNTER — Ambulatory Visit: Payer: Medicare HMO

## 2022-02-16 ENCOUNTER — Inpatient Hospital Stay: Payer: Medicare HMO

## 2022-02-16 VITALS — BP 192/88 | HR 58 | Resp 17 | Wt 137.0 lb

## 2022-02-16 DIAGNOSIS — C250 Malignant neoplasm of head of pancreas: Secondary | ICD-10-CM

## 2022-02-16 DIAGNOSIS — Z5189 Encounter for other specified aftercare: Secondary | ICD-10-CM | POA: Diagnosis not present

## 2022-02-16 DIAGNOSIS — Z5111 Encounter for antineoplastic chemotherapy: Secondary | ICD-10-CM | POA: Insufficient documentation

## 2022-02-16 DIAGNOSIS — Z95828 Presence of other vascular implants and grafts: Secondary | ICD-10-CM

## 2022-02-16 LAB — CMP (CANCER CENTER ONLY)
ALT: 25 U/L (ref 0–44)
AST: 32 U/L (ref 15–41)
Albumin: 3.6 g/dL (ref 3.5–5.0)
Alkaline Phosphatase: 58 U/L (ref 38–126)
Anion gap: 4 — ABNORMAL LOW (ref 5–15)
BUN: 24 mg/dL — ABNORMAL HIGH (ref 8–23)
CO2: 31 mmol/L (ref 22–32)
Calcium: 9 mg/dL (ref 8.9–10.3)
Chloride: 102 mmol/L (ref 98–111)
Creatinine: 1.07 mg/dL (ref 0.61–1.24)
GFR, Estimated: >60 mL/min (ref >60)
Glucose, Bld: 82 mg/dL (ref 70–99)
Potassium: 3.9 mmol/L (ref 3.5–5.1)
Sodium: 137 mmol/L (ref 135–145)
Total Bilirubin: 0.5 mg/dL (ref 0.3–1.2)
Total Protein: 6.5 g/dL (ref 6.5–8.1)

## 2022-02-16 LAB — CBC WITH DIFFERENTIAL (CANCER CENTER ONLY)
Abs Immature Granulocytes: 0.06 10*3/uL (ref 0.00–0.07)
Basophils Absolute: 0 10*3/uL (ref 0.0–0.1)
Basophils Relative: 0 %
Eosinophils Absolute: 0 10*3/uL (ref 0.0–0.5)
Eosinophils Relative: 0 %
HCT: 27.1 % — ABNORMAL LOW (ref 39.0–52.0)
Hemoglobin: 8.7 g/dL — ABNORMAL LOW (ref 13.0–17.0)
Immature Granulocytes: 2 %
Lymphocytes Relative: 67 %
Lymphs Abs: 2.5 10*3/uL (ref 0.7–4.0)
MCH: 24.8 pg — ABNORMAL LOW (ref 26.0–34.0)
MCHC: 32.1 g/dL (ref 30.0–36.0)
MCV: 77.2 fL — ABNORMAL LOW (ref 80.0–100.0)
Monocytes Absolute: 0.1 10*3/uL (ref 0.1–1.0)
Monocytes Relative: 3 %
Neutro Abs: 1 10*3/uL — ABNORMAL LOW (ref 1.7–7.7)
Neutrophils Relative %: 28 %
Platelet Count: 429 10*3/uL — ABNORMAL HIGH (ref 150–400)
RBC: 3.51 MIL/uL — ABNORMAL LOW (ref 4.22–5.81)
RDW: 17.9 % — ABNORMAL HIGH (ref 11.5–15.5)
WBC Count: 3.7 10*3/uL — ABNORMAL LOW (ref 4.0–10.5)
nRBC: 0 % (ref 0.0–0.2)

## 2022-02-16 MED ORDER — HEPARIN SOD (PORK) LOCK FLUSH 100 UNIT/ML IV SOLN
500.0000 [IU] | Freq: Once | INTRAVENOUS | Status: AC | PRN
  Administered 2022-02-16: 500 [IU]

## 2022-02-16 MED ORDER — SODIUM CHLORIDE 0.9 % IV SOLN: Freq: Once | INTRAVENOUS | Status: AC

## 2022-02-16 MED ORDER — SODIUM CHLORIDE 0.9% FLUSH
10.0000 mL | INTRAVENOUS | Status: DC | PRN
  Administered 2022-02-16: 10 mL

## 2022-02-16 MED ORDER — SODIUM CHLORIDE 0.9 % IV SOLN
800.0000 mg/m2 | Freq: Once | INTRAVENOUS | Status: AC
  Administered 2022-02-16: 1482 mg via INTRAVENOUS
  Filled 2022-02-16: qty 38.98

## 2022-02-16 MED ORDER — PROCHLORPERAZINE MALEATE 10 MG PO TABS
10.0000 mg | ORAL_TABLET | Freq: Once | ORAL | Status: AC
  Administered 2022-02-16: 10 mg via ORAL
  Filled 2022-02-16: qty 1

## 2022-02-16 NOTE — Patient Instructions (Signed)
[facility]  Discharge Instructions: Thank you for choosing [facility] to provide your oncology and hematology care.   If you have a lab appointment with the Cancer Center, please go directly to the Cancer Center and check in at the registration area.   Wear comfortable clothing and clothing appropriate for easy access to any Portacath or PICC line.   We strive to give you quality time with your provider. You may need to reschedule your appointment if you arrive late (15 or more minutes).  Arriving late affects you and other patients whose appointments are after yours.  Also, if you miss three or more appointments without notifying the office, you may be dismissed from the clinic at the provider's discretion.      For prescription refill requests, have your pharmacy contact our office and allow 72 hours for refills to be completed.    Today you received the following chemotherapy and/or immunotherapy agents: Gemzar      To help prevent nausea and vomiting after your treatment, we encourage you to take your nausea medication as directed.  BELOW ARE SYMPTOMS THAT SHOULD BE REPORTED IMMEDIATELY: *FEVER GREATER THAN 100.4 F (38 C) OR HIGHER *CHILLS OR SWEATING *NAUSEA AND VOMITING THAT IS NOT CONTROLLED WITH YOUR NAUSEA MEDICATION *UNUSUAL SHORTNESS OF BREATH *UNUSUAL BRUISING OR BLEEDING *URINARY PROBLEMS (pain or burning when urinating, or frequent urination) *BOWEL PROBLEMS (unusual diarrhea, constipation, pain near the anus) TENDERNESS IN MOUTH AND THROAT WITH OR WITHOUT PRESENCE OF ULCERS (sore throat, sores in mouth, or a toothache) UNUSUAL RASH, SWELLING OR PAIN  UNUSUAL VAGINAL DISCHARGE OR ITCHING   Items with * indicate a potential emergency and should be followed up as soon as possible or go to the Emergency Department if any problems should occur.  Please show the CHEMOTHERAPY ALERT CARD or IMMUNOTHERAPY ALERT CARD at check-in to the  Emergency Department and triage nurse.  Should you have questions after your visit or need to cancel or reschedule your appointment, please contact [facility]  Dept: [phone]  and follow the prompts.  Office hours are 8:00 a.m. to 4:30 p.m. Monday - Friday. Please note that voicemails left after 4:00 p.m. may not be returned until the following business day.  We are closed weekends and major holidays. You have access to a nurse at all times for urgent questions. Please call the main number to the clinic Dept: [phone] and follow the prompts.   For any non-urgent questions, you may also contact your provider using MyChart. We now offer e-Visits for anyone 18 and older to request care online for non-urgent symptoms. For details visit [email or web address].   Also download the MyChart app! Go to the app store, search "MyChart", open the app, select [facility], and log in with your MyChart username and password.  Masks are optional in the cancer centers. If you would like for your care team to wear a mask while they are taking care of you, please let them know. For doctor visits, patients may have with them one support person who is at least 82 years old. At this time, visitors are not allowed in the infusion area. 

## 2022-02-16 NOTE — Progress Notes (Signed)
Ok to treat with bp 190/82 per Regan Rakers, NP. Pt is aware and agrees to keep a log at home of bp and will update PCP.

## 2022-02-16 NOTE — Telephone Encounter (Signed)
Scheduled follow-up appointments per 6/29 los. Patient is aware.

## 2022-02-18 ENCOUNTER — Inpatient Hospital Stay: Payer: Medicare HMO

## 2022-02-18 ENCOUNTER — Other Ambulatory Visit: Payer: Self-pay

## 2022-02-18 VITALS — BP 138/78 | HR 67 | Temp 98.5°F | Resp 16

## 2022-02-18 DIAGNOSIS — Z5189 Encounter for other specified aftercare: Secondary | ICD-10-CM | POA: Diagnosis not present

## 2022-02-18 DIAGNOSIS — Z5111 Encounter for antineoplastic chemotherapy: Secondary | ICD-10-CM | POA: Diagnosis not present

## 2022-02-18 DIAGNOSIS — C250 Malignant neoplasm of head of pancreas: Secondary | ICD-10-CM | POA: Diagnosis not present

## 2022-02-18 DIAGNOSIS — C251 Malignant neoplasm of body of pancreas: Secondary | ICD-10-CM

## 2022-02-18 MED ORDER — PEGFILGRASTIM-CBQV 6 MG/0.6ML ~~LOC~~ SOSY
6.0000 mg | PREFILLED_SYRINGE | Freq: Once | SUBCUTANEOUS | Status: AC
  Administered 2022-02-18: 6 mg via SUBCUTANEOUS
  Filled 2022-02-18: qty 0.6

## 2022-02-18 NOTE — Patient Instructions (Signed)
Please take Claritin today and for the next several days to ease bone pain. Gardiner Rhyme, RN

## 2022-02-22 ENCOUNTER — Other Ambulatory Visit (HOSPITAL_COMMUNITY): Payer: Self-pay

## 2022-02-22 ENCOUNTER — Other Ambulatory Visit: Payer: Self-pay

## 2022-02-22 ENCOUNTER — Encounter: Payer: Self-pay | Admitting: Hematology

## 2022-02-22 DIAGNOSIS — R197 Diarrhea, unspecified: Secondary | ICD-10-CM

## 2022-02-22 DIAGNOSIS — K909 Intestinal malabsorption, unspecified: Secondary | ICD-10-CM

## 2022-02-22 DIAGNOSIS — C251 Malignant neoplasm of body of pancreas: Secondary | ICD-10-CM

## 2022-02-22 MED ORDER — PANCRELIPASE (LIP-PROT-AMYL) 36000-114000 UNITS PO CPEP
ORAL_CAPSULE | ORAL | 0 refills | Status: AC
  Filled 2022-02-22: qty 180, 30d supply, fill #0

## 2022-02-22 NOTE — Progress Notes (Signed)
Per Dr. Burr Medico send new prescription for Creon 1 pill with every meal (4 meals/day) and 1 pill with snacks (2 snacks/day).  No refills until f/u with Dr. Burr Medico in clinic.  Sent new prescription to [facility].

## 2022-02-23 ENCOUNTER — Other Ambulatory Visit (HOSPITAL_COMMUNITY): Payer: Self-pay

## 2022-02-28 ENCOUNTER — Telehealth: Payer: Self-pay | Admitting: Hematology

## 2022-02-28 NOTE — Telephone Encounter (Signed)
Per 7/17 phone line pt daughter called to r/s appointment  date and time picked by pt.  R/s appointment per pt request

## 2022-03-01 ENCOUNTER — Inpatient Hospital Stay: Payer: Medicare HMO

## 2022-03-01 ENCOUNTER — Inpatient Hospital Stay: Payer: Medicare HMO | Admitting: Hematology

## 2022-03-03 ENCOUNTER — Encounter: Payer: Self-pay | Admitting: Hematology

## 2022-03-03 ENCOUNTER — Inpatient Hospital Stay: Payer: Medicare HMO

## 2022-03-03 ENCOUNTER — Other Ambulatory Visit (HOSPITAL_COMMUNITY): Payer: Self-pay

## 2022-03-03 ENCOUNTER — Other Ambulatory Visit: Payer: Self-pay

## 2022-03-03 ENCOUNTER — Encounter: Payer: Medicare HMO | Admitting: Nutrition

## 2022-03-03 ENCOUNTER — Encounter: Payer: Self-pay | Admitting: Nutrition

## 2022-03-03 ENCOUNTER — Inpatient Hospital Stay (HOSPITAL_BASED_OUTPATIENT_CLINIC_OR_DEPARTMENT_OTHER): Payer: Medicare HMO | Admitting: Hematology

## 2022-03-03 VITALS — BP 135/74 | HR 75 | Temp 97.9°F | Resp 18 | Wt 128.3 lb

## 2022-03-03 DIAGNOSIS — Z95828 Presence of other vascular implants and grafts: Secondary | ICD-10-CM

## 2022-03-03 DIAGNOSIS — Z5189 Encounter for other specified aftercare: Secondary | ICD-10-CM | POA: Diagnosis not present

## 2022-03-03 DIAGNOSIS — C250 Malignant neoplasm of head of pancreas: Secondary | ICD-10-CM | POA: Diagnosis not present

## 2022-03-03 DIAGNOSIS — C251 Malignant neoplasm of body of pancreas: Secondary | ICD-10-CM

## 2022-03-03 DIAGNOSIS — Z5111 Encounter for antineoplastic chemotherapy: Secondary | ICD-10-CM | POA: Diagnosis not present

## 2022-03-03 LAB — CBC WITH DIFFERENTIAL (CANCER CENTER ONLY)
Abs Immature Granulocytes: 0.17 10*3/uL — ABNORMAL HIGH (ref 0.00–0.07)
Basophils Absolute: 0 10*3/uL (ref 0.0–0.1)
Basophils Relative: 0 %
Eosinophils Absolute: 0 10*3/uL (ref 0.0–0.5)
Eosinophils Relative: 0 %
HCT: 28.5 % — ABNORMAL LOW (ref 39.0–52.0)
Hemoglobin: 9.3 g/dL — ABNORMAL LOW (ref 13.0–17.0)
Immature Granulocytes: 2 %
Lymphocytes Relative: 20 %
Lymphs Abs: 2 10*3/uL (ref 0.7–4.0)
MCH: 26 pg (ref 26.0–34.0)
MCHC: 32.6 g/dL (ref 30.0–36.0)
MCV: 79.6 fL — ABNORMAL LOW (ref 80.0–100.0)
Monocytes Absolute: 0.8 10*3/uL (ref 0.1–1.0)
Monocytes Relative: 8 %
Neutro Abs: 6.8 10*3/uL (ref 1.7–7.7)
Neutrophils Relative %: 70 %
Platelet Count: 241 10*3/uL (ref 150–400)
RBC: 3.58 MIL/uL — ABNORMAL LOW (ref 4.22–5.81)
RDW: 23.9 % — ABNORMAL HIGH (ref 11.5–15.5)
WBC Count: 9.8 10*3/uL (ref 4.0–10.5)
nRBC: 0 % (ref 0.0–0.2)

## 2022-03-03 LAB — CMP (CANCER CENTER ONLY)
ALT: 16 U/L (ref 0–44)
AST: 26 U/L (ref 15–41)
Albumin: 3.7 g/dL (ref 3.5–5.0)
Alkaline Phosphatase: 117 U/L (ref 38–126)
Anion gap: 6 (ref 5–15)
BUN: 12 mg/dL (ref 8–23)
CO2: 28 mmol/L (ref 22–32)
Calcium: 9.1 mg/dL (ref 8.9–10.3)
Chloride: 105 mmol/L (ref 98–111)
Creatinine: 1.18 mg/dL (ref 0.61–1.24)
GFR, Estimated: >60 mL/min (ref >60)
Glucose, Bld: 97 mg/dL (ref 70–99)
Potassium: 3.9 mmol/L (ref 3.5–5.1)
Sodium: 139 mmol/L (ref 135–145)
Total Bilirubin: 0.6 mg/dL (ref 0.3–1.2)
Total Protein: 6.5 g/dL (ref 6.5–8.1)

## 2022-03-03 MED ORDER — SODIUM CHLORIDE 0.9% FLUSH
10.0000 mL | INTRAVENOUS | Status: DC | PRN
  Administered 2022-03-03: 10 mL

## 2022-03-03 MED ORDER — SODIUM CHLORIDE 0.9 % IV SOLN
800.0000 mg/m2 | Freq: Once | INTRAVENOUS | Status: AC
  Administered 2022-03-03: 1482 mg via INTRAVENOUS
  Filled 2022-03-03: qty 38.98

## 2022-03-03 MED ORDER — PANTOPRAZOLE SODIUM 40 MG PO TBEC
40.0000 mg | DELAYED_RELEASE_TABLET | Freq: Every day | ORAL | 0 refills | Status: DC
  Filled 2022-03-03: qty 90, 90d supply, fill #0

## 2022-03-03 MED ORDER — SODIUM CHLORIDE 0.9 % IV SOLN: Freq: Once | INTRAVENOUS | Status: AC

## 2022-03-03 MED ORDER — PROCHLORPERAZINE MALEATE 10 MG PO TABS
10.0000 mg | ORAL_TABLET | Freq: Once | ORAL | Status: AC
  Administered 2022-03-03: 10 mg via ORAL
  Filled 2022-03-03: qty 1

## 2022-03-03 MED ORDER — HEPARIN SOD (PORK) LOCK FLUSH 100 UNIT/ML IV SOLN
500.0000 [IU] | Freq: Once | INTRAVENOUS | Status: AC | PRN
  Administered 2022-03-03: 500 [IU]

## 2022-03-03 NOTE — Progress Notes (Signed)
[facility]   Telephone:([phone] Fax:(336) [phone]   Clinic Follow up Note   Patient Care Team: Ronnell Freshwater, NP as PCP - General (Family Medicine) Buford Dresser, MD as PCP - Cardiology (Cardiology) Truitt Merle, MD as Consulting Physician (Oncology) Royston Bake, RN as Oncology Nurse Navigator (Oncology)  Date of Service:  03/03/2022  CHIEF COMPLAINT: f/u of pancreatic cancer  CURRENT THERAPY:  Gemcitabine, days 1 and 8 q21d, starting 01/20/22  ASSESSMENT & PLAN:  Angel Keller. is a 81 y.o. male with   1. 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 EUS and pancreatic mass biopsy on 08/05/21 confirmed adenocarcinoma. -Pancreatic protocol CT scan showed 3.6 x 2.6 cm pancreatic head mass, partially involving multiple vessels, including SMV, portal vein, and hepatic artery.  -he began neoadjuvant gemcitabine alone on 08/20/21. He was admitted for fever after cycle 1. He tolerated cycle 2 with gem and abraxane poorly overall with fatigue, low appetite, and a mild intermittent nausea. -due to low BP, we have held chemo after cycle 2. -restaging CT AP on 10/20/21 showed partial response to therapy.  -he proceeded to whipple procedure on 11/15/21 with Dr. Zenia Resides. 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 Margins and lymph nodes negative. -he restarted adjuvant gemcitabine on 01/20/22. He tolerated well, no sickness or fatigue. Will monitor. -labs reviewed, improved with GCF-S on day 10-- WBC 9.8, hgb 9.3, ANC 6.8. Will continue.   2. Diarrhea, abdominal cramping and weight loss  -secondary to pancreatic surgery -he started creon 01/04/22 -weight is down again today. He reports he is walking and working out in the yard. They request additional Ensure, will reach out to nutritionist.   3. HTN -he is holding his BP  medicines during chemotherapy.   4. Microcytic anemia -Iron panel 01/20/22 confirmed iron deficiency anemia-- ferritin 9, iron serum 25.  -prescribed ferrous sulfate 325 mg that day -hgb improved to 9.3 today (03/03/22)     PLAN -proceed with gem today and in 1 week             -GCF-S on day 10 -lab, flush, and gemcitabine in 3, 5, and 6 weeks due to daughter being out of town.  -GCF-S in 6 weeks (2 days after chemo) -f/u in 3 and 5 weeks   No problem-specific Assessment & Plan notes found for this encounter.   SUMMARY OF ONCOLOGIC HISTORY: Oncology History Overview Note   Cancer Staging  Pancreatic cancer [facility]) Staging form: Exocrine Pancreas, AJCC 8th Edition - Clinical stage from 08/05/2021: Stage IB (cT2, cN0, cM0) - Signed by Truitt Merle, MD on 08/17/2021 Stage prefix: Initial diagnosis Total positive nodes: 0     Pancreatic mass  07/18/2021 Initial Diagnosis   Pancreatic mass   07/18/2021 Imaging   CT Abdomen Pelvis W Contrast  IMPRESSION: 1. Suspected solid mass within the proximal body of the pancreas measures 2.3 x 2.6 x 2.0 cm. There is downstream dilation of the main pancreatic duct and its branches. There is also intra and extrahepatic biliary ductal dilation. Further evaluation with MRI of the abdomen, pancreatic protocol, when clinically feasible, may be considered. 2. 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 3. Aortic atherosclerosis   07/20/2021 Procedure   DG ERCP  IMPRESSION: Nondiagnostic ERCP as above. Correlation with the operative report is advised.  - The examination was suspicious for a biliary stricture in  the bile duct. - Examination was suspicious for carcinoma of the head of the pancreas. - Attempts at a cholangiogram failed.   07/22/2021 Pathology Results   CASE: [id number]   FINAL MICROSCOPIC DIAGNOSIS:  - Suspicious for malignancy  - See comment   DIAGNOSTIC COMMENTS:  There are rare cells with cytologic  atypia suspicious for  adenocarcinoma.  Dr. Vic Ripper agrees.    07/22/2021 Procedure   IR INT EXT BILIARY DRAIN WITH CHOLANGIOGRAM ( IMPRESSION: 1. 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 2. Successful brush biopsy of obstructing lesion x3. 3. Successful placement of a 10 French internal/external biliary drainage catheter.   PLAN: 1. Follow bilirubin. When significant downward trend is clear, the bag should be capped. Recommend capping bag before discharge home if possible. 2. Follow-up in IR in 4-6 weeks for initial biliary tube check and exchange. If initial brush biopsies are negative, repeat biopsy could be considered at that time.   Pancreatic cancer ([place])  08/05/2021 Cancer Staging   Staging form: Exocrine Pancreas, AJCC 8th Edition - Clinical stage from 08/05/2021: Stage IB (cT2, cN0, cM0) - Signed by Truitt Merle, MD on 08/17/2021 Stage prefix: Initial diagnosis Total positive nodes: 0   08/14/2021 Initial Diagnosis   Pancreatic cancer ([facility])   08/20/2021 -  Chemotherapy   Patient is on Treatment Plan : PANCREATIC Abraxane / Gemcitabine D1,8 q21d     11/15/2021 Cancer Staging   Staging form: Exocrine Pancreas, AJCC 8th Edition - Pathologic stage from 11/15/2021: Stage IB (ypT2, pN0, cM0) - Signed by Truitt Merle, MD on 01/03/2022 Stage prefix: Post-therapy Total positive nodes: 0 Histologic grade (G): G3 Histologic grading system: 3 grade system Residual tumor (R): R0 - None      INTERVAL HISTORY:  Angel Celeste Sr. is here for a follow up of pancreatic cancer. He was last seen by me on 02/10/22. He presents to the clinic accompanied by his daughter. He reports he is feeling well today. They tell me they were walking in [place] while waiting for their appointment today. He notes he enjoys working outside, despite the heat; he endorses drinking plenty of water. He denies pain from injection.   All other systems were  reviewed with the patient and are negative.  MEDICAL HISTORY:  Past Medical History:  Diagnosis Date   Aneurysm ([facility])    2017   Anxiety    Arthritis    Depression    Family history of breast cancer    Family history of lung cancer    Headache    HLD (hyperlipidemia)    Hx of inguinal hernia repair    Hypertension    Hypothyroidism    Overactive bladder    Pancreatic cancer ([facility])    Pneumonia    Shingles    Sleep apnea    Patient denies   Thoracic ascending aortic aneurysm ([facility])    mildly dilated 4.0cm per 08/12/21 CT   Thyroid disease    Varicose veins of both lower extremities     SURGICAL HISTORY: Past Surgical History:  Procedure Laterality Date   BILATERAL CARPAL TUNNEL RELEASE     CATARACT EXTRACTION, BILATERAL Bilateral    ELBOW SURGERY Bilateral    ENDOSCOPIC RETROGRADE CHOLANGIOPANCREATOGRAPHY (ERCP) WITH PROPOFOL N/A 07/20/2021   Procedure: ENDOSCOPIC RETROGRADE CHOLANGIOPANCREATOGRAPHY (ERCP) WITH PROPOFOL;  Surgeon: Gatha Mayer, MD;  [facility] END[facility]COPY;  Service: Endoscopy;  Laterality: N/A;   ESOPHAGOGASTRODUODENOSCOPY (EGD) WITH PROPOFOL N/A 08/05/2021   Procedure: ESOPHAGOGASTRODUODENOSCOPY (EGD)  WITH PROPOFOL;  Surgeon: Mansouraty, Telford Nab., MD;  Location: [facility];  Service: Gastroenterology;  Laterality: N/A;   EUS N/A 08/05/2021   Procedure: UPPER ENDOSCOPIC ULTRASOUND (EUS) RADIAL;  Surgeon: Irving Copas., MD;  Location: [facility];  Service: Gastroenterology;  Laterality: N/A;   EYE SURGERY     bilateral cataracts   FINE NEEDLE ASPIRATION  08/05/2021   Procedure: FINE NEEDLE ASPIRATION (FNA) LINEAR;  Surgeon: Irving Copas., MD;  Location: [facility];  Service: Gastroenterology;;   INGUINAL HERNIA REPAIR Right 1951   IR ANGIO INTRA EXTRACRAN SEL COM CAROTID INNOMINATE BILAT MOD SED  02/20/2019   IR BILIARY STENT(S) EXISTING ACCESS INC DILATION CATH EXCHANGE  09/20/2021   IR ENDOLUMINAL BX OF BILIARY TREE   07/22/2021   IR GENERIC HISTORICAL  10/28/2016   IR RADIOLOGIST EVAL & MGMT 10/28/2016 [facility]INTER[facility] RAD   IR INT EXT BILIARY DRAIN WITH CHOLANGIOGRAM  07/22/2021   JOINT REPLACEMENT     LAPAROSCOPY N/A 11/15/2021   Procedure: LAPAROSCOPIC DIAGNOSTIC STAGING;  Surgeon: Dwan Bolt, MD;  Location: [facility];  Service: General;  Laterality: N/A;   PORTACATH PLACEMENT N/A 08/19/2021   Procedure: INSERTION PORT-A-CATH;  Surgeon: Dwan Bolt, MD;  Location: [facility];  Service: General;  Laterality: N/A;  LMA   RADIOLOGY WITH ANESTHESIA N/A 10/21/2015   Procedure: RADIOLOGY WITH ANESTHESIA;  Surgeon: Luanne Bras, MD;  Location: [facility];  Service: Radiology;  Laterality: N/A;   RADIOLOGY WITH ANESTHESIA N/A 02/20/2019   Procedure: [employer];  Surgeon: Luanne Bras, MD;  Location: [facility];  Service: Radiology;  Laterality: N/A;   ROTATOR CUFF REPAIR Bilateral    VARICOSE VEIN SURGERY     WHIPPLE PROCEDURE N/A 11/15/2021   Procedure: WHIPPLE PROCEDURE;  Surgeon: Dwan Bolt, MD;  Location: [facility];  Service: General;  Laterality: N/A;    I have reviewed the social history and family history with the patient and they are unchanged from previous note.  ALLERGIES:  has No Known Allergies.  MEDICATIONS:  Current Outpatient Medications  Medication Sig Dispense Refill   acetaminophen (TYLENOL) 500 MG tablet Take 1 tablet (500 mg total) by mouth every 6 (six) hours as needed for mild pain. 30 tablet 0   apixaban (ELIQUIS) 5 MG TABS tablet Take 1 tablet (5 mg total) by mouth 2 (two) times daily. 60 tablet 2   Cholecalciferol (VITAMIN D3) 50 MCG (2000 UT) TABS Take 2,000 Units by mouth daily.     dexamethasone (DECADRON) 4 MG tablet Take 1 tablet (4 mg total) by mouth daily. For 3-5 days after chemo for nausea and fatigue 15 tablet 0   diltiazem (CARDIZEM) 30 MG tablet Take 1 tablet (30 mg total) by mouth 3 (three) times daily. 90 tablet 2   docusate sodium (COLACE) 100 MG capsule Take 1 capsule  (100 mg total) by mouth 2 (two) times daily. (Patient taking differently: Take 100 mg by mouth 2 (two) times daily as needed.) 28 capsule 0   escitalopram (LEXAPRO) 20 MG tablet Take 1 tablet (20 mg total) by mouth daily. 90 tablet 1   famotidine (PEPCID) 20 MG tablet Take 1 tablet (20 mg total) by mouth 2 (two) times daily. 60 tablet 1   feeding supplement (ENSURE ENLIVE / ENSURE PLUS) LIQD Take 237 mLs by mouth 3 (three) times daily between meals. 237 mL 12   ferrous sulfate 325 (65 FE) MG EC tablet Take 1 tablet (325 mg total) by mouth daily with breakfast. 30 tablet  3   lipase/protease/amylase (CREON) 36000 UNITS CPEP capsule Take 1 capsule by mouth 4 times daily. May also take 1 capsule as needed with snacks (2 snacks per day in between meals). 180 capsule 0   mirtazapine (REMERON) 7.5 MG tablet Take 1 tablet (7.5 mg total) by mouth at bedtime. 90 tablet 0   Multiple Vitamin (MULTIVITAMIN WITH MINERALS) TABS tablet Take 1 tablet by mouth daily. (Patient not taking: Reported on 11/05/2021) 30 tablet 0   ondansetron (ZOFRAN) 4 MG tablet Take 1 tablet (4 mg total) by mouth every 8 (eight) hours as needed for nausea or vomiting. (Patient taking differently: Take 8 mg by mouth every 8 (eight) hours as needed for nausea or vomiting.) 20 tablet 0   pantoprazole (PROTONIX) 40 MG tablet Take 1 tablet by mouth daily. 90 tablet 0   polyethylene glycol (MIRALAX / GLYCOLAX) 17 g packet Take 17 g by mouth daily as needed for moderate constipation. 14 each 0   prochlorperazine (COMPAZINE) 10 MG tablet Take 1 tablet (10 mg total) by mouth every 6 (six) hours as needed (Nausea or vomiting). 30 tablet 1   SYNTHROID 112 MCG tablet Take 1 tablet (112 mcg total) by mouth daily before breakfast. 90 tablet 1   vitamin B-12 (CYANOCOBALAMIN) 1000 MCG tablet Take 1,000 mcg by mouth 2 (two) times daily.     No current facility-administered medications for this visit.   Facility-Administered Medications Ordered in Other  Visits  Medication Dose Route Frequency Provider Last Rate Last Admin   sodium chloride flush (NS) 0.9 % injection 10 mL  10 mL Intracatheter PRN Truitt Merle, MD   10 mL at 03/03/22 1504    PHYSICAL EXAMINATION: ECOG PERFORMANCE STATUS: 1 - Symptomatic but completely ambulatory  Vitals:   03/03/22 1255  BP: 135/74  Pulse: 75  Resp: 18  Temp: 97.9 F (36.6 C)  SpO2: 99%   Wt Readings from Last 3 Encounters:  03/03/22 128 lb 5 oz (58.2 kg)  02/16/22 137 lb (62.1 kg)  02/10/22 131 lb 11.2 oz (59.7 kg)     GENERAL:alert, no distress and comfortable SKIN: skin color normal, no rashes or significant lesions EYES: normal, Conjunctiva are pink and non-injected, sclera clear  NEURO: alert & oriented x 3 with fluent speech  LABORATORY DATA:  I have reviewed the data as listed    Latest Ref Rng & Units 03/03/2022   12:28 PM 02/16/2022   12:09 PM 02/10/2022   10:42 AM  CBC  WBC 4.0 - 10.5 K/uL 9.8  3.7  2.9   Hemoglobin 13.0 - 17.0 g/dL 9.3  8.7  8.5   Hematocrit 39.0 - 52.0 % 28.5  27.1  26.2   Platelets 150 - 400 K/uL 241  429  337         Latest Ref Rng & Units 03/03/2022   12:28 PM 02/16/2022   12:09 PM 02/10/2022   10:42 AM  CMP  Glucose 70 - 99 mg/dL 97  82  105   BUN 8 - 23 mg/dL '12  24  19   ' Creatinine 0.61 - 1.24 mg/dL 1.18  1.07  1.19   Sodium 135 - 145 mmol/L 139  137  138   Potassium 3.5 - 5.1 mmol/L 3.9  3.9  4.1   Chloride 98 - 111 mmol/L 105  102  104   CO2 22 - 32 mmol/L '28  31  30   ' Calcium 8.9 - 10.3 mg/dL 9.1  9.0  9.1  Total Protein 6.5 - 8.1 g/dL 6.5  6.5  6.5   Total Bilirubin 0.3 - 1.2 mg/dL 0.6  0.5  0.4   Alkaline Phos 38 - 126 U/L 117  58  67   AST 15 - 41 U/L 26  32  21   ALT 0 - 44 U/L '16  25  12       ' RADIOGRAPHIC STUDIES: I have personally reviewed the radiological images as listed and agreed with the findings in the report. No results found.    No orders of the defined types were placed in this encounter.  All questions were answered.  The patient knows to call the clinic with any problems, questions or concerns. No barriers to learning was detected. The total time spent in the appointment was 30 minutes.     Truitt Merle, MD 03/03/2022   I, Wilburn Mylar, am acting as scribe for Truitt Merle, MD.   I have reviewed the above documentation for accuracy and completeness, and I agree with the above.

## 2022-03-03 NOTE — Progress Notes (Signed)
Brief nutrition follow-up with patient receiving treatment for pancreas cancer.   Patient is status post Whipple procedure on April 3.  Weight decreased and documented as 128 pounds 5 ounces on July 20.  This is decreased from 131 pounds 11.2 ounces June 29. Patient reports his appetite is pretty good and he thinks he is eating well. Denies nutrition impact symptoms.  He has requested a case of Ensure plus high-protein.  Nutrition diagnosis:  Severe malnutrition, ongoing.  Intervention:  Encouraged patient to try to eat frequently throughout the day. Continue to drink Ensure plus high-protein twice daily. Provided 1 complementary case of Ensure Plus high-protein.  Monitoring, evaluation, goals: Patient will tolerate increased calories and protein for minimal weight loss.  Next visit to be scheduled.

## 2022-03-03 NOTE — Patient Instructions (Signed)
[facility] MEDICAL [facility]COLOG[facility]  Discharge Instructions: Thank you for choosing [facility] to provide your oncology and hematology care.   If you have a lab appointment with the Cancer Center, please go directly to the Cancer Center and check in at the registration area.   Wear comfortable clothing and clothing appropriate for easy access to any Portacath or PICC line.   We strive to give you quality time with your provider. You may need to reschedule your appointment if you arrive late (15 or more minutes).  Arriving late affects you and other patients whose appointments are after yours.  Also, if you miss three or more appointments without notifying the office, you may be dismissed from the clinic at the provider's discretion.      For prescription refill requests, have your pharmacy contact our office and allow 72 hours for refills to be completed.    Today you received the following chemotherapy and/or immunotherapy agents: Gemzar      To help prevent nausea and vomiting after your treatment, we encourage you to take your nausea medication as directed.  BELOW ARE SYMPTOMS THAT SHOULD BE REPORTED IMMEDIATELY: *FEVER GREATER THAN 100.4 F (38 C) OR HIGHER *CHILLS OR SWEATING *NAUSEA AND VOMITING THAT IS NOT CONTROLLED WITH YOUR NAUSEA MEDICATION *UNUSUAL SHORTNESS OF BREATH *UNUSUAL BRUISING OR BLEEDING *URINARY PROBLEMS (pain or burning when urinating, or frequent urination) *BOWEL PROBLEMS (unusual diarrhea, constipation, pain near the anus) TENDERNESS IN MOUTH AND THROAT WITH OR WITHOUT PRESENCE OF ULCERS (sore throat, sores in mouth, or a toothache) UNUSUAL RASH, SWELLING OR PAIN  UNUSUAL VAGINAL DISCHARGE OR ITCHING   Items with * indicate a potential emergency and should be followed up as soon as possible or go to the Emergency Department if any problems should occur.  Please show the CHEMOTHERAPY ALERT CARD or IMMUNOTHERAPY ALERT CARD at check-in to the  Emergency Department and triage nurse.  Should you have questions after your visit or need to cancel or reschedule your appointment, please contact [facility]  Dept: [phone]  and follow the prompts.  Office hours are 8:00 a.m. to 4:30 p.m. Monday - Friday. Please note that voicemails left after 4:00 p.m. may not be returned until the following business day.  We are closed weekends and major holidays. You have access to a nurse at all times for urgent questions. Please call the main number to the clinic Dept: [phone] and follow the prompts.   For any non-urgent questions, you may also contact your provider using MyChart. We now offer e-Visits for anyone 18 and older to request care online for non-urgent symptoms. For details visit [email or web address].   Also download the MyChart app! Go to the app store, search "MyChart", open the app, select , and log in with your MyChart username and password.  Masks are optional in the cancer centers. If you would like for your care team to wear a mask while they are taking care of you, please let them know. For doctor visits, patients may have with them one support person who is at least 82 years old. At this time, visitors are not allowed in the infusion area. 

## 2022-03-04 ENCOUNTER — Telehealth: Payer: Self-pay

## 2022-03-04 LAB — CANCER ANTIGEN 19-9: CA 19-9: 22 U/mL (ref 0–35)

## 2022-03-04 NOTE — Telephone Encounter (Signed)
Pt's daughter called stating she does not know why the pt did not tell Dr. Burr Medico when they were in clinic yesterday that the pt has 2 ulcers in his mouth.  Pt's daughter she found out when they arrived back home.  Pt's daughter wanted to let Dr. Burr Medico know of the pt's mouth ulcers and wants to know what does she recommend.  Notified Dr. Burr Medico of the pt's daughter's call.

## 2022-03-07 ENCOUNTER — Encounter: Payer: Self-pay | Admitting: Hematology

## 2022-03-07 ENCOUNTER — Other Ambulatory Visit: Payer: Self-pay

## 2022-03-08 ENCOUNTER — Other Ambulatory Visit: Payer: Self-pay

## 2022-03-08 ENCOUNTER — Telehealth: Payer: Self-pay | Admitting: Hematology

## 2022-03-08 ENCOUNTER — Other Ambulatory Visit (HOSPITAL_COMMUNITY): Payer: Self-pay

## 2022-03-08 ENCOUNTER — Encounter: Payer: Self-pay | Admitting: Hematology

## 2022-03-08 MED ORDER — NYSTATIN 100000 UNIT/ML MT SUSP
Freq: Three times a day (TID) | OROMUCOSAL | 1 refills | Status: DC | PRN
  Filled 2022-03-08: qty 120, 8d supply, fill #0
  Filled 2022-03-17: qty 120, 8d supply, fill #1

## 2022-03-08 NOTE — Telephone Encounter (Signed)
Scheduled follow-up appointments per 7/20 los. Patient's daughter is aware.

## 2022-03-09 ENCOUNTER — Encounter: Payer: Self-pay | Admitting: Licensed Clinical Social Worker

## 2022-03-09 ENCOUNTER — Inpatient Hospital Stay: Payer: Medicare HMO

## 2022-03-09 ENCOUNTER — Other Ambulatory Visit (HOSPITAL_COMMUNITY): Payer: Self-pay

## 2022-03-09 ENCOUNTER — Other Ambulatory Visit: Payer: Self-pay

## 2022-03-09 VITALS — BP 149/84 | HR 71 | Temp 98.0°F | Resp 18 | Wt 132.2 lb

## 2022-03-09 DIAGNOSIS — Z5189 Encounter for other specified aftercare: Secondary | ICD-10-CM | POA: Diagnosis not present

## 2022-03-09 DIAGNOSIS — C250 Malignant neoplasm of head of pancreas: Secondary | ICD-10-CM

## 2022-03-09 DIAGNOSIS — Z5111 Encounter for antineoplastic chemotherapy: Secondary | ICD-10-CM | POA: Diagnosis not present

## 2022-03-09 DIAGNOSIS — C251 Malignant neoplasm of body of pancreas: Secondary | ICD-10-CM

## 2022-03-09 DIAGNOSIS — Z95828 Presence of other vascular implants and grafts: Secondary | ICD-10-CM

## 2022-03-09 LAB — CBC WITH DIFFERENTIAL (CANCER CENTER ONLY)
Abs Immature Granulocytes: 0.03 10*3/uL (ref 0.00–0.07)
Basophils Absolute: 0 10*3/uL (ref 0.0–0.1)
Basophils Relative: 1 %
Eosinophils Absolute: 0 10*3/uL (ref 0.0–0.5)
Eosinophils Relative: 1 %
HCT: 26.1 % — ABNORMAL LOW (ref 39.0–52.0)
Hemoglobin: 8.6 g/dL — ABNORMAL LOW (ref 13.0–17.0)
Immature Granulocytes: 1 %
Lymphocytes Relative: 33 %
Lymphs Abs: 1.3 10*3/uL (ref 0.7–4.0)
MCH: 26.2 pg (ref 26.0–34.0)
MCHC: 33 g/dL (ref 30.0–36.0)
MCV: 79.6 fL — ABNORMAL LOW (ref 80.0–100.0)
Monocytes Absolute: 0.1 10*3/uL (ref 0.1–1.0)
Monocytes Relative: 2 %
Neutro Abs: 2.6 10*3/uL (ref 1.7–7.7)
Neutrophils Relative %: 62 %
Platelet Count: 300 10*3/uL (ref 150–400)
RBC: 3.28 MIL/uL — ABNORMAL LOW (ref 4.22–5.81)
RDW: 24.5 % — ABNORMAL HIGH (ref 11.5–15.5)
WBC Count: 4.1 10*3/uL (ref 4.0–10.5)
nRBC: 0 % (ref 0.0–0.2)

## 2022-03-09 LAB — CMP (CANCER CENTER ONLY)
ALT: 26 U/L (ref 0–44)
AST: 31 U/L (ref 15–41)
Albumin: 3.7 g/dL (ref 3.5–5.0)
Alkaline Phosphatase: 81 U/L (ref 38–126)
Anion gap: 4 — ABNORMAL LOW (ref 5–15)
BUN: 20 mg/dL (ref 8–23)
CO2: 30 mmol/L (ref 22–32)
Calcium: 8.7 mg/dL — ABNORMAL LOW (ref 8.9–10.3)
Chloride: 102 mmol/L (ref 98–111)
Creatinine: 1.1 mg/dL (ref 0.61–1.24)
GFR, Estimated: >60 mL/min (ref >60)
Glucose, Bld: 97 mg/dL (ref 70–99)
Potassium: 4.1 mmol/L (ref 3.5–5.1)
Sodium: 136 mmol/L (ref 135–145)
Total Bilirubin: 0.6 mg/dL (ref 0.3–1.2)
Total Protein: 6.5 g/dL (ref 6.5–8.1)

## 2022-03-09 MED ORDER — HEPARIN SOD (PORK) LOCK FLUSH 100 UNIT/ML IV SOLN
500.0000 [IU] | Freq: Once | INTRAVENOUS | Status: AC | PRN
  Administered 2022-03-09: 500 [IU]

## 2022-03-09 MED ORDER — SODIUM CHLORIDE 0.9 % IV SOLN: Freq: Once | INTRAVENOUS | Status: AC

## 2022-03-09 MED ORDER — SODIUM CHLORIDE 0.9 % IV SOLN
800.0000 mg/m2 | Freq: Once | INTRAVENOUS | Status: DC
  Filled 2022-03-09: qty 39

## 2022-03-09 MED ORDER — SODIUM CHLORIDE 0.9 % IV SOLN
1300.0000 mg | Freq: Once | INTRAVENOUS | Status: AC
  Administered 2022-03-09: 1300 mg via INTRAVENOUS
  Filled 2022-03-09: qty 34

## 2022-03-09 MED ORDER — SODIUM CHLORIDE 0.9% FLUSH
10.0000 mL | INTRAVENOUS | Status: DC | PRN
  Administered 2022-03-09: 10 mL

## 2022-03-09 MED ORDER — PROCHLORPERAZINE MALEATE 10 MG PO TABS
10.0000 mg | ORAL_TABLET | Freq: Once | ORAL | Status: AC
  Administered 2022-03-09: 10 mg via ORAL
  Filled 2022-03-09: qty 1

## 2022-03-09 NOTE — Patient Instructions (Signed)
[facility]COL[facility]  Discharge Instructions: Thank you for choosing [facility] to provide your oncology and hematology care.   If you have a lab appointment with the Cancer Center, please go directly to the Cancer Center and check in at the registration area.   Wear comfortable clothing and clothing appropriate for easy access to any Portacath or PICC line.   We strive to give you quality time with your provider. You may need to reschedule your appointment if you arrive late (15 or more minutes).  Arriving late affects you and other patients whose appointments are after yours.  Also, if you miss three or more appointments without notifying the office, you may be dismissed from the clinic at the provider's discretion.      For prescription refill requests, have your pharmacy contact our office and allow 72 hours for refills to be completed.    Today you received the following chemotherapy and/or immunotherapy agents: Gemzar      To help prevent nausea and vomiting after your treatment, we encourage you to take your nausea medication as directed.  BELOW ARE SYMPTOMS THAT SHOULD BE REPORTED IMMEDIATELY: *FEVER GREATER THAN 100.4 F (38 C) OR HIGHER *CHILLS OR SWEATING *NAUSEA AND VOMITING THAT IS NOT CONTROLLED WITH YOUR NAUSEA MEDICATION *UNUSUAL SHORTNESS OF BREATH *UNUSUAL BRUISING OR BLEEDING *URINARY PROBLEMS (pain or burning when urinating, or frequent urination) *BOWEL PROBLEMS (unusual diarrhea, constipation, pain near the anus) TENDERNESS IN MOUTH AND THROAT WITH OR WITHOUT PRESENCE OF ULCERS (sore throat, sores in mouth, or a toothache) UNUSUAL RASH, SWELLING OR PAIN  UNUSUAL VAGINAL DISCHARGE OR ITCHING   Items with * indicate a potential emergency and should be followed up as soon as possible or go to the Emergency Department if any problems should occur.  Please show the CHEMOTHERAPY ALERT CARD or IMMUNOTHERAPY ALERT CARD at check-in to the  Emergency Department and triage nurse.  Should you have questions after your visit or need to cancel or reschedule your appointment, please contact [facility]  Dept: [phone]  and follow the prompts.  Office hours are 8:00 a.m. to 4:30 p.m. Monday - Friday. Please note that voicemails left after 4:00 p.m. may not be returned until the following business day.  We are closed weekends and major holidays. You have access to a nurse at all times for urgent questions. Please call the main number to the clinic Dept: [phone] and follow the prompts.   For any non-urgent questions, you may also contact your provider using MyChart. We now offer e-Visits for anyone 18 and older to request care online for non-urgent symptoms. For details visit [email or web address].   Also download the MyChart app! Go to the app store, search "MyChart", open the app, select [facility], and log in with your MyChart username and password.  Masks are optional in the cancer centers. If you would like for your care team to wear a mask while they are taking care of you, please let them know. For doctor visits, patients may have with them one support Tonnie Friedel who is at least 82 years old. At this time, visitors are not allowed in the infusion area. 

## 2022-03-10 ENCOUNTER — Encounter: Payer: Self-pay | Admitting: Hematology

## 2022-03-10 NOTE — Progress Notes (Signed)
[facility] Work  Initial Assessment   Angel Keller. is a 81 y.o. year old male presenting alone. Clinical Social Work was referred by  financial assistance  for assessment of psychosocial needs.   SDOH (Social Determinants of Health) assessments performed: Yes SDOH Interventions    Flowsheet Row Most Recent Value  SDOH Interventions   Financial Strain Interventions Financial Counselor       SDOH Screenings   Alcohol Screen: Not on file  Depression (PHQ2-9): Medium Risk (02/03/2022)   Depression (PHQ2-9)    PHQ-2 Score: 10  Financial Resource Strain: High Risk (03/10/2022)   Overall Financial Resource Strain (CARDIA)    Difficulty of Paying Living Expenses: Hard  Food Insecurity: Not on file  Housing: Not on file  Physical Activity: Not on file  Social Connections: Not on file  Stress: Not on file  Tobacco Use: Medium Risk (03/03/2022)   Patient History    Smoking Tobacco Use: Former    Smokeless Tobacco Use: Never    Passive Exposure: Not on file  Transportation Needs: Not on file     Distress Screen completed: No     No data to display            Family/Social Information:  Housing Arrangement: patient lives with his daughter.   Pt's daughter relies on assistance from pt as she has struggled w/ employment and finances. Family members/support persons in your life? Family.  Pt has 2 daughters, 2 sons, 7 grandchildren, 41 great grandchildren, and 1 great great grandchild.  Pt lost a daughter in 2004 and his wife in 2007. Transportation concerns: Pt's oldest daughter provides transportation to and from treatment as pt is at times light headed and expressed concern w/ driving himself.  The financial impact of paying for gas to and from treatment has been challenging.  Employment: Retired Pt retired from [employer] a number of years ago.  Income source: Paediatric nurse concerns: Yes, current concerns Type of concern:  Transportation and Medical bills Food access concerns: no Religious or spiritual practice: Hydrographic surveyor Currently in place:  Medicare, social security retirement benefits  Coping/ Adjustment to diagnosis: Patient understands treatment plan and what happens next? yes Concerns about diagnosis and/or treatment: Quality of life and Prior to diagnosis pt reports he has always been physically active, regularly going to the gym.  Pt states it has been difficult to experience such a dramatic physical decline since diagnosis and to be unable to independently do all of the things he previously was able to do. Patient reported stressors: Adjusting to my illness Hopes and/or priorities: Pt's priority is to regain physical strength with the hope of returning to his pre-diagnosis level of physical fitness. Patient enjoys being outside, exercise, and time with family/ friends Current coping skills/ strengths: Motivation for treatment/growth , Supportive family/friends , and Other: Pt states his children have already lost a sibling and mother and it is his goal to "be around for as long as possible"    SUMMARY: Current SDOH Barriers:  Financial constraints related to medical and transportation costs.  Clinical Social Work Clinical Goal(s):  Explore community resource options for unmet needs related to:  Financial Strain   Interventions: Discussed common feeling and emotions when being diagnosed with cancer, and the importance of support during treatment Informed patient of the support team roles and support services at [facility] Provided CSW contact information and encouraged patient to call with any questions or concerns Pt previously referred  to Development worker, community and has exhausted the [employer].  CSW provided pt w/ a [employer] card as well as an [employer] and a link to another grant specific for pancreatic cancer.   Follow Up Plan:  Pt to register on the link and complete  grant application.  CSW to submit grant on pt's behalf once completed.  Patient verbalizes understanding of plan: Yes    Henriette Combs, LCSW

## 2022-03-11 ENCOUNTER — Inpatient Hospital Stay: Payer: Medicare HMO

## 2022-03-11 ENCOUNTER — Other Ambulatory Visit: Payer: Self-pay

## 2022-03-11 VITALS — BP 134/69 | HR 71 | Temp 97.6°F | Resp 16

## 2022-03-11 DIAGNOSIS — Z5189 Encounter for other specified aftercare: Secondary | ICD-10-CM | POA: Diagnosis not present

## 2022-03-11 DIAGNOSIS — C251 Malignant neoplasm of body of pancreas: Secondary | ICD-10-CM

## 2022-03-11 DIAGNOSIS — C250 Malignant neoplasm of head of pancreas: Secondary | ICD-10-CM | POA: Diagnosis not present

## 2022-03-11 DIAGNOSIS — Z5111 Encounter for antineoplastic chemotherapy: Secondary | ICD-10-CM | POA: Diagnosis not present

## 2022-03-11 MED ORDER — PEGFILGRASTIM-CBQV 6 MG/0.6ML ~~LOC~~ SOSY
6.0000 mg | PREFILLED_SYRINGE | Freq: Once | SUBCUTANEOUS | Status: AC
  Administered 2022-03-11: 6 mg via SUBCUTANEOUS
  Filled 2022-03-11: qty 0.6

## 2022-03-15 ENCOUNTER — Other Ambulatory Visit: Payer: Self-pay

## 2022-03-16 ENCOUNTER — Encounter: Payer: Self-pay | Admitting: Licensed Clinical Social Worker

## 2022-03-18 ENCOUNTER — Telehealth: Payer: Self-pay | Admitting: Hematology

## 2022-03-18 ENCOUNTER — Telehealth: Payer: Self-pay

## 2022-03-18 ENCOUNTER — Encounter: Payer: Self-pay | Admitting: Hematology

## 2022-03-18 ENCOUNTER — Other Ambulatory Visit (HOSPITAL_COMMUNITY): Payer: Self-pay

## 2022-03-18 NOTE — Telephone Encounter (Signed)
Called to schedule per 8/3 in basket, pt daughter said he did not want to come in and would call back if pt changes his mind

## 2022-03-18 NOTE — Telephone Encounter (Signed)
LVM (tried calling 3 times) for pt's daughter "Lattie Haw" to give Dr. Ernestina Penna office a call regarding appt today w/Cassie H., PA-C.  Awaiting return call from "[place]".

## 2022-03-18 NOTE — Telephone Encounter (Signed)
Pt's daughter Lattie Haw called back stating that the swelling in the pt's legs has resolved because she made him elevate his legs all day yesterday.  Lattie Haw also stated that the pt stated he does not want to go anywhere today because it's messy outside and if he starts to feel bad over the weekend he will go to the ED or wait and call Dr. Ernestina Penna office on Monday.  Notified Dr. Burr Medico and Fritz Pickerel, PA-C of pt's daughter's telephone call.

## 2022-03-22 ENCOUNTER — Encounter: Payer: Self-pay | Admitting: Licensed Clinical Social Worker

## 2022-03-22 DIAGNOSIS — C251 Malignant neoplasm of body of pancreas: Secondary | ICD-10-CM

## 2022-03-22 NOTE — Progress Notes (Signed)
[facility] CSW Progress Note  Holiday representative  spoke with pt's daughter, Lattie Haw who confirmed she will e-mail a copy of pt's social security statement and bills to submit to [employer].  Once received grant will be submitted on pt's behalf.        Henriette Combs, LCSW

## 2022-03-23 ENCOUNTER — Inpatient Hospital Stay: Payer: Medicare HMO

## 2022-03-23 ENCOUNTER — Inpatient Hospital Stay: Payer: Medicare HMO | Attending: Physician Assistant

## 2022-03-23 ENCOUNTER — Inpatient Hospital Stay (HOSPITAL_BASED_OUTPATIENT_CLINIC_OR_DEPARTMENT_OTHER): Payer: Medicare HMO | Admitting: Hematology

## 2022-03-23 VITALS — BP 171/77 | HR 69 | Temp 98.1°F | Resp 16

## 2022-03-23 VITALS — BP 153/82 | HR 66 | Temp 98.8°F | Wt 136.3 lb

## 2022-03-23 DIAGNOSIS — Z5111 Encounter for antineoplastic chemotherapy: Secondary | ICD-10-CM | POA: Insufficient documentation

## 2022-03-23 DIAGNOSIS — D509 Iron deficiency anemia, unspecified: Secondary | ICD-10-CM | POA: Insufficient documentation

## 2022-03-23 DIAGNOSIS — Z95828 Presence of other vascular implants and grafts: Secondary | ICD-10-CM

## 2022-03-23 DIAGNOSIS — C251 Malignant neoplasm of body of pancreas: Secondary | ICD-10-CM

## 2022-03-23 DIAGNOSIS — C250 Malignant neoplasm of head of pancreas: Secondary | ICD-10-CM

## 2022-03-23 DIAGNOSIS — I480 Paroxysmal atrial fibrillation: Secondary | ICD-10-CM

## 2022-03-23 LAB — CBC WITH DIFFERENTIAL (CANCER CENTER ONLY)
Abs Immature Granulocytes: 0.3 10*3/uL — ABNORMAL HIGH (ref 0.00–0.07)
Basophils Absolute: 0.1 10*3/uL (ref 0.0–0.1)
Basophils Relative: 0 %
Eosinophils Absolute: 0 10*3/uL (ref 0.0–0.5)
Eosinophils Relative: 0 %
HCT: 26.2 % — ABNORMAL LOW (ref 39.0–52.0)
Hemoglobin: 8.5 g/dL — ABNORMAL LOW (ref 13.0–17.0)
Immature Granulocytes: 2 %
Lymphocytes Relative: 15 %
Lymphs Abs: 2 10*3/uL (ref 0.7–4.0)
MCH: 26.9 pg (ref 26.0–34.0)
MCHC: 32.4 g/dL (ref 30.0–36.0)
MCV: 82.9 fL (ref 80.0–100.0)
Monocytes Absolute: 1 10*3/uL (ref 0.1–1.0)
Monocytes Relative: 8 %
Neutro Abs: 9.8 10*3/uL — ABNORMAL HIGH (ref 1.7–7.7)
Neutrophils Relative %: 75 %
Platelet Count: 309 10*3/uL (ref 150–400)
RBC: 3.16 MIL/uL — ABNORMAL LOW (ref 4.22–5.81)
RDW: 28 % — ABNORMAL HIGH (ref 11.5–15.5)
WBC Count: 13.2 10*3/uL — ABNORMAL HIGH (ref 4.0–10.5)
nRBC: 0 % (ref 0.0–0.2)

## 2022-03-23 LAB — CMP (CANCER CENTER ONLY)
ALT: 18 U/L (ref 0–44)
AST: 27 U/L (ref 15–41)
Albumin: 3.8 g/dL (ref 3.5–5.0)
Alkaline Phosphatase: 114 U/L (ref 38–126)
Anion gap: 4 — ABNORMAL LOW (ref 5–15)
BUN: 21 mg/dL (ref 8–23)
CO2: 30 mmol/L (ref 22–32)
Calcium: 8.9 mg/dL (ref 8.9–10.3)
Chloride: 105 mmol/L (ref 98–111)
Creatinine: 1.19 mg/dL (ref 0.61–1.24)
GFR, Estimated: >60 mL/min (ref >60)
Glucose, Bld: 95 mg/dL (ref 70–99)
Potassium: 4.1 mmol/L (ref 3.5–5.1)
Sodium: 139 mmol/L (ref 135–145)
Total Bilirubin: 0.3 mg/dL (ref 0.3–1.2)
Total Protein: 6.7 g/dL (ref 6.5–8.1)

## 2022-03-23 MED ORDER — APIXABAN 5 MG PO TABS: 5.0000 mg | ORAL_TABLET | Freq: Two times a day (BID) | ORAL | 2 refills | Status: DC

## 2022-03-23 MED ORDER — SODIUM CHLORIDE 0.9 % IV SOLN: Freq: Once | INTRAVENOUS | Status: AC

## 2022-03-23 MED ORDER — SODIUM CHLORIDE 0.9% FLUSH
10.0000 mL | INTRAVENOUS | Status: DC | PRN
  Administered 2022-03-23: 10 mL

## 2022-03-23 MED ORDER — HEPARIN SOD (PORK) LOCK FLUSH 100 UNIT/ML IV SOLN
500.0000 [IU] | Freq: Once | INTRAVENOUS | Status: AC | PRN
  Administered 2022-03-23: 500 [IU]

## 2022-03-23 MED ORDER — PROCHLORPERAZINE MALEATE 10 MG PO TABS
ORAL_TABLET | ORAL | Status: AC
  Filled 2022-03-23: qty 1

## 2022-03-23 MED ORDER — DEXAMETHASONE 4 MG PO TABS: 4.0000 mg | ORAL_TABLET | Freq: Every day | ORAL | 1 refills | Status: DC

## 2022-03-23 MED ORDER — PROCHLORPERAZINE MALEATE 10 MG PO TABS
10.0000 mg | ORAL_TABLET | Freq: Once | ORAL | Status: AC
  Administered 2022-03-23: 10 mg via ORAL

## 2022-03-23 MED ORDER — SODIUM CHLORIDE 0.9 % IV SOLN
800.0000 mg/m2 | Freq: Once | INTRAVENOUS | Status: AC
  Administered 2022-03-23: 1482 mg via INTRAVENOUS
  Filled 2022-03-23: qty 38.98

## 2022-03-23 NOTE — Progress Notes (Signed)
[place]   Telephone:([phone]) [phone] Fax:(336) [phone]   Clinic Follow up Note   Patient Care Team: Ronnell Freshwater, NP as PCP - General (Family Medicine) Buford Dresser, MD as PCP - Cardiology (Cardiology) Truitt Merle, MD as Consulting Physician (Oncology) Royston Bake, RN as Oncology Nurse Navigator (Oncology)  Date of Service:  03/23/2022  CHIEF COMPLAINT: f/u of pancreatic cancer  CURRENT THERAPY:  Gemcitabine, days 1 and 8 q21d, starting 01/20/22  ASSESSMENT & PLAN:  Angel Acy. is a 81 y.o. male with   1. 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 EUS and pancreatic mass biopsy on 08/05/21 confirmed adenocarcinoma. -Pancreatic protocol CT scan showed 3.6 x 2.6 cm pancreatic head mass, partially involving multiple vessels, including SMV, portal vein, and hepatic artery.  -Angel Keller began neoadjuvant gemcitabine alone on 08/20/21. Angel Keller was admitted for fever after cycle 1. Angel Keller tolerated cycle 2 with gem and abraxane poorly overall with fatigue, low appetite, and a mild intermittent nausea. -due to low BP, we have held chemo after cycle 2. -restaging CT AP on 10/20/21 showed partial response to therapy.  -Angel Keller proceeded to whipple procedure on 11/15/21 with Dr. Zenia Resides. 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 Margins and lymph nodes negative. -Angel Keller restarted adjuvant gemcitabine on 01/20/22. Angel Keller tolerated well, no sickness or fatigue. Will monitor. -labs reviewed, improved with GCF-S on day 10-- WBC 13.2, hgb 8.5, ANC 9.8. Will continue.  2. LE edema and erythema -Angel Keller developed erythema yesterday. Physical exam shows mild tenderness and warmth. I called in lasix for him to start.   3. Diarrhea, abdominal cramping and weight loss  -secondary to pancreatic surgery -Angel Keller started creon 01/04/22 -weight is up some, 136 lbs today  (03/23/22)   4. HTN -Angel Keller is holding his BP medicines during chemotherapy.   5. Microcytic anemia -Iron panel 01/20/22 confirmed iron deficiency anemia-- ferritin 9, iron serum 25.  -prescribed ferrous sulfate 325 mg that day -hgb stable at 8.5 today (03/23/22)     PLAN -proceed with gem today -lab, flush, and gemcitabine in 2, 4, and 5 weeks due to daughter being out of town.             -GCF-S in 5 weeks (2 days after chemo) -f/u in 2 and 4 weeks   No problem-specific Assessment & Plan notes found for this encounter.   SUMMARY OF ONCOLOGIC HISTORY: Oncology History Overview Note   Cancer Staging  Pancreatic cancer [facility]) Staging form: Exocrine Pancreas, AJCC 8th Edition - Clinical stage from 08/05/2021: Stage IB (cT2, cN0, cM0) - Signed by Truitt Merle, MD on 08/17/2021 Stage prefix: Initial diagnosis Total positive nodes: 0     Pancreatic mass  07/18/2021 Initial Diagnosis   Pancreatic mass   07/18/2021 Imaging   CT Abdomen Pelvis W Contrast  IMPRESSION: 1. Suspected solid mass within the proximal body of the pancreas measures 2.3 x 2.6 x 2.0 cm. There is downstream dilation of the main pancreatic duct and its branches. There is also intra and extrahepatic biliary ductal dilation. Further evaluation with MRI of the abdomen, pancreatic protocol, when clinically feasible, may be considered. 2. 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 3. Aortic atherosclerosis   07/20/2021 Procedure   DG ERCP  IMPRESSION: Nondiagnostic ERCP as above. Correlation with the operative report is advised.  - The examination was suspicious for a biliary stricture in the  bile duct. - Examination was suspicious for carcinoma of the head of the pancreas. - Attempts at a cholangiogram failed.   07/22/2021 Pathology Results   CASE: [id number]   FINAL MICROSCOPIC DIAGNOSIS:  - Suspicious for malignancy  - See comment   DIAGNOSTIC COMMENTS:  There are rare cells with  cytologic atypia suspicious for  adenocarcinoma.  Dr. Vic Ripper agrees.    07/22/2021 Procedure   IR INT EXT BILIARY DRAIN WITH CHOLANGIOGRAM ( IMPRESSION: 1. 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 2. Successful brush biopsy of obstructing lesion x3. 3. Successful placement of a 10 French internal/external biliary drainage catheter.   PLAN: 1. Follow bilirubin. When significant downward trend is clear, the bag should be capped. Recommend capping bag before discharge home if possible. 2. Follow-up in IR in 4-6 weeks for initial biliary tube check and exchange. If initial brush biopsies are negative, repeat biopsy could be considered at that time.   Pancreatic cancer ([place])  08/05/2021 Cancer Staging   Staging form: Exocrine Pancreas, AJCC 8th Edition - Clinical stage from 08/05/2021: Stage IB (cT2, cN0, cM0) - Signed by Truitt Merle, MD on 08/17/2021 Stage prefix: Initial diagnosis Total positive nodes: 0   08/14/2021 Initial Diagnosis   Pancreatic cancer ([place])   08/20/2021 -  Chemotherapy   Patient is on Treatment Plan : PANCREATIC Abraxane / Gemcitabine D1,8 q21d     11/15/2021 Cancer Staging   Staging form: Exocrine Pancreas, AJCC 8th Edition - Pathologic stage from 11/15/2021: Stage IB (ypT2, pN0, cM0) - Signed by Truitt Merle, MD on 01/03/2022 Stage prefix: Post-therapy Total positive nodes: 0 Histologic grade (G): G3 Histologic grading system: 3 grade system Residual tumor (R): R0 - None   Pancreatic adenocarcinoma ([place])  11/15/2021 Initial Diagnosis   Pancreatic adenocarcinoma ([place])      INTERVAL HISTORY:  Angel Celeste Sr. is here for a follow up of pancreatic cancer. Angel Keller was last seen by me on 03/03/22. Angel Keller presents to the clinic accompanied by his daughter. They reports new erythema and warmth to his lower legs, as well as swelling R>L.   All other systems were reviewed with the patient and are  negative.  MEDICAL HISTORY:  Past Medical History:  Diagnosis Date   Aneurysm ([place])    2017   Anxiety    Arthritis    Depression    Family history of breast cancer    Family history of lung cancer    Headache    HLD (hyperlipidemia)    Hx of inguinal hernia repair    Hypertension    Hypothyroidism    Overactive bladder    Pancreatic cancer ([facility])    Pneumonia    Shingles    Sleep apnea    Patient denies   Thoracic ascending aortic aneurysm ([place])    mildly dilated 4.0cm per 08/12/21 CT   Thyroid disease    Varicose veins of both lower extremities     SURGICAL HISTORY: Past Surgical History:  Procedure Laterality Date   BILATERAL CARPAL TUNNEL RELEASE     CATARACT EXTRACTION, BILATERAL Bilateral    ELBOW SURGERY Bilateral    ENDOSCOPIC RETROGRADE CHOLANGIOPANCREATOGRAPHY (ERCP) WITH PROPOFOL N/A 07/20/2021   Procedure: ENDOSCOPIC RETROGRADE CHOLANGIOPANCREATOGRAPHY (ERCP) WITH PROPOFOL;  Surgeon: Gatha Mayer, MD;  Location: [facility] ENDOSCOPY;  Service: Endoscopy;  Laterality: N/A;   ESOPHAGOGASTRODUODENOSCOPY (EGD) WITH PROPOFOL N/A 08/05/2021   Procedure: ESOPHAGOGASTRODUODENOSCOPY (EGD) WITH PROPOFOL;  Surgeon: Rush Landmark Telford Nab., MD;  Location: [place];  Service: Gastroenterology;  Laterality: N/A;   EUS N/A 08/05/2021   Procedure: UPPER ENDOSCOPIC ULTRASOUND (EUS) RADIAL;  Surgeon: Irving Copas., MD;  Location: [place];  Service: Gastroenterology;  Laterality: N/A;   EYE SURGERY     bilateral cataracts   FINE NEEDLE ASPIRATION  08/05/2021   Procedure: FINE NEEDLE ASPIRATION (FNA) LINEAR;  Surgeon: Irving Copas., MD;  Location: [place];  Service: Gastroenterology;;   INGUINAL HERNIA REPAIR Right 1951   IR ANGIO INTRA EXTRACRAN SEL COM CAROTID INNOMINATE BILAT MOD SED  02/20/2019   IR BILIARY STENT(S) EXISTING ACCESS INC DILATION CATH EXCHANGE  09/20/2021   IR ENDOLUMINAL BX OF BILIARY TREE  07/22/2021   IR GENERIC HISTORICAL   10/28/2016   IR RADIOLOGIST EVAL & MGMT 10/28/2016 [facility]INTER[facility] RAD   IR INT EXT BILIARY DRAIN WITH CHOLANGIOGRAM  07/22/2021   JOINT REPLACEMENT     LAPAROSCOPY N/A 11/15/2021   Procedure: LAPAROSCOPIC DIAGNOSTIC STAGING;  Surgeon: Dwan Bolt, MD;  Location: [place];  Service: General;  Laterality: N/A;   PORTACATH PLACEMENT N/A 08/19/2021   Procedure: INSERTION PORT-A-CATH;  Surgeon: Dwan Bolt, MD;  Location: [facility];  Service: General;  Laterality: N/A;  LMA   RADIOLOGY WITH ANESTHESIA N/A 10/21/2015   Procedure: RADIOLOGY WITH ANESTHESIA;  Surgeon: Luanne Bras, MD;  Location: [place];  Service: Radiology;  Laterality: N/A;   RADIOLOGY WITH ANESTHESIA N/A 02/20/2019   Procedure: [employer];  Surgeon: Luanne Bras, MD;  Location: [place];  Service: Radiology;  Laterality: N/A;   ROTATOR CUFF REPAIR Bilateral    VARICOSE VEIN SURGERY     WHIPPLE PROCEDURE N/A 11/15/2021   Procedure: WHIPPLE PROCEDURE;  Surgeon: Dwan Bolt, MD;  Location: [place];  Service: General;  Laterality: N/A;    I have reviewed the social history and family history with the patient and they are unchanged from previous note.  ALLERGIES:  has No Known Allergies.  MEDICATIONS:  Current Outpatient Medications  Medication Sig Dispense Refill   acetaminophen (TYLENOL) 500 MG tablet Take 1 tablet (500 mg total) by mouth every 6 (six) hours as needed for mild pain. 30 tablet 0   apixaban (ELIQUIS) 5 MG TABS tablet Take 1 tablet (5 mg total) by mouth 2 (two) times daily. 60 tablet 2   Cholecalciferol (VITAMIN D3) 50 MCG (2000 UT) TABS Take 2,000 Units by mouth daily.     dexamethasone (DECADRON) 4 MG tablet Take 1 tablet (4 mg total) by mouth daily. For 3-5 days after chemo for nausea and fatigue 15 tablet 0   diltiazem (CARDIZEM) 30 MG tablet Take 1 tablet (30 mg total) by mouth 3 (three) times daily. 90 tablet 2   docusate sodium (COLACE) 100 MG capsule Take 1 capsule (100 mg total) by mouth 2 (two) times  daily. (Patient taking differently: Take 100 mg by mouth 2 (two) times daily as needed.) 28 capsule 0   escitalopram (LEXAPRO) 20 MG tablet Take 1 tablet (20 mg total) by mouth daily. 90 tablet 1   famotidine (PEPCID) 20 MG tablet Take 1 tablet (20 mg total) by mouth 2 (two) times daily. 60 tablet 1   feeding supplement (ENSURE ENLIVE / ENSURE PLUS) LIQD Take 237 mLs by mouth 3 (three) times daily between meals. 237 mL 12   ferrous sulfate 325 (65 FE) MG EC tablet Take 1 tablet (325 mg total) by mouth daily with breakfast. 30 tablet 3   lipase/protease/amylase (CREON) 36000 UNITS CPEP capsule Take 1 capsule by  mouth 4 times daily. May also take 1 capsule as needed with snacks (2 snacks per day in between meals). 180 capsule 0   nystatin-diphenhydrAMINE-alum & mag hydroxide-simeth Take 5 MLs by mouth 3 (three) times daily as needed for mouth pain. 120 mL 1   mirtazapine (REMERON) 7.5 MG tablet Take 1 tablet (7.5 mg total) by mouth at bedtime. 90 tablet 0   Multiple Vitamin (MULTIVITAMIN WITH MINERALS) TABS tablet Take 1 tablet by mouth daily. (Patient not taking: Reported on 11/05/2021) 30 tablet 0   ondansetron (ZOFRAN) 4 MG tablet Take 1 tablet (4 mg total) by mouth every 8 (eight) hours as needed for nausea or vomiting. (Patient taking differently: Take 8 mg by mouth every 8 (eight) hours as needed for nausea or vomiting.) 20 tablet 0   pantoprazole (PROTONIX) 40 MG tablet Take 1 tablet by mouth daily. 90 tablet 0   polyethylene glycol (MIRALAX / GLYCOLAX) 17 g packet Take 17 g by mouth daily as needed for moderate constipation. 14 each 0   prochlorperazine (COMPAZINE) 10 MG tablet Take 1 tablet (10 mg total) by mouth every 6 (six) hours as needed (Nausea or vomiting). 30 tablet 1   SYNTHROID 112 MCG tablet Take 1 tablet (112 mcg total) by mouth daily before breakfast. 90 tablet 1   vitamin B-12 (CYANOCOBALAMIN) 1000 MCG tablet Take 1,000 mcg by mouth 2 (two) times daily.     No current  facility-administered medications for this visit.   Facility-Administered Medications Ordered in Other Visits  Medication Dose Route Frequency Provider Last Rate Last Admin   sodium chloride flush (NS) 0.9 % injection 10 mL  10 mL Intracatheter PRN Truitt Merle, MD   10 mL at 03/23/22 1200    PHYSICAL EXAMINATION: ECOG PERFORMANCE STATUS: {CHL ONC ECOG PS:[phone]}  There were no vitals filed for this visit. Wt Readings from Last 3 Encounters:  03/09/22 132 lb 4 oz (60 kg)  03/03/22 128 lb 5 oz (58.2 kg)  02/16/22 137 lb (62.1 kg)     GENERAL:alert, no distress and comfortable SKIN: skin color normal, no rashes or significant lesions EYES: normal, Conjunctiva are pink and non-injected, sclera clear  HEART: (+) LE edema and erythema NEURO: alert & oriented x 3 with fluent speech  LABORATORY DATA:  I have reviewed the data as listed    Latest Ref Rng & Units 03/23/2022   12:08 PM 03/09/2022   12:22 PM 03/03/2022   12:28 PM  CBC  WBC 4.0 - 10.5 K/uL 13.2  4.1  9.8   Hemoglobin 13.0 - 17.0 g/dL 8.5  8.6  9.3   Hematocrit 39.0 - 52.0 % 26.2  26.1  28.5   Platelets 150 - 400 K/uL 309  300  241         Latest Ref Rng & Units 03/09/2022   12:22 PM 03/03/2022   12:28 PM 02/16/2022   12:09 PM  CMP  Glucose 70 - 99 mg/dL 97  97  82   BUN 8 - 23 mg/dL '20  12  24   ' Creatinine 0.61 - 1.24 mg/dL 1.10  1.18  1.07   Sodium 135 - 145 mmol/L 136  139  137   Potassium 3.5 - 5.1 mmol/L 4.1  3.9  3.9   Chloride 98 - 111 mmol/L 102  105  102   CO2 22 - 32 mmol/L '30  28  31   ' Calcium 8.9 - 10.3 mg/dL 8.7  9.1  9.0   Total Protein  6.5 - 8.1 g/dL 6.5  6.5  6.5   Total Bilirubin 0.3 - 1.2 mg/dL 0.6  0.6  0.5   Alkaline Phos 38 - 126 U/L 81  117  58   AST 15 - 41 U/L 31  26  32   ALT 0 - 44 U/L '26  16  25       ' RADIOGRAPHIC STUDIES: I have personally reviewed the radiological images as listed and agreed with the findings in the report. No results found.    No orders of the defined types  were placed in this encounter.  All questions were answered. The patient knows to call the clinic with any problems, questions or concerns. No barriers to learning was detected. The total time spent in the appointment was {CHL ONC TIME VISIT - GQQPY:[id number]}.     Aurea Graff 03/23/2022   I, Wilburn Mylar, am acting as scribe for Truitt Merle, MD.   {Add scribe attestation statement}

## 2022-03-23 NOTE — Progress Notes (Signed)
Gemzar dose reduced last time d/t mouth sores.  Ok to proceed with ordered dose this time per MD - patient has recovered.

## 2022-03-23 NOTE — Patient Instructions (Signed)
[facility]ICAL [facility]COLOGY  Discharge Instructions: Thank you for choosing [facility] to provide your oncology and hematology care.   If you have a lab appointment with the Cancer Center, please go directly to the Cancer Center and check in at the registration area.   Wear comfortable clothing and clothing appropriate for easy access to any Portacath or PICC line.   We strive to give you quality time with your provider. You may need to reschedule your appointment if you arrive late (15 or more minutes).  Arriving late affects you and other patients whose appointments are after yours.  Also, if you miss three or more appointments without notifying the office, you may be dismissed from the clinic at the provider's discretion.      For prescription refill requests, have your pharmacy contact our office and allow 72 hours for refills to be completed.    Today you received the following chemotherapy and/or immunotherapy agents: Gemzar      To help prevent nausea and vomiting after your treatment, we encourage you to take your nausea medication as directed.  BELOW ARE SYMPTOMS THAT SHOULD BE REPORTED IMMEDIATELY: *FEVER GREATER THAN 100.4 F (38 C) OR HIGHER *CHILLS OR SWEATING *NAUSEA AND VOMITING THAT IS NOT CONTROLLED WITH YOUR NAUSEA MEDICATION *UNUSUAL SHORTNESS OF BREATH *UNUSUAL BRUISING OR BLEEDING *URINARY PROBLEMS (pain or burning when urinating, or frequent urination) *BOWEL PROBLEMS (unusual diarrhea, constipation, pain near the anus) TENDERNESS IN MOUTH AND THROAT WITH OR WITHOUT PRESENCE OF ULCERS (sore throat, sores in mouth, or a toothache) UNUSUAL RASH, SWELLING OR PAIN  UNUSUAL VAGINAL DISCHARGE OR ITCHING   Items with * indicate a potential emergency and should be followed up as soon as possible or go to the Emergency Department if any problems should occur.  Please show the CHEMOTHERAPY ALERT CARD or IMMUNOTHERAPY ALERT CARD at check-in to the  Emergency Department and triage nurse.  Should you have questions after your visit or need to cancel or reschedule your appointment, please contact [facility]  Dept: [phone]  and follow the prompts.  Office hours are 8:00 a.m. to 4:30 p.m. Monday - Friday. Please note that voicemails left after 4:00 p.m. may not be returned until the following business day.  We are closed weekends and major holidays. You have access to a nurse at all times for urgent questions. Please call the main number to the clinic Dept: [phone] and follow the prompts.   For any non-urgent questions, you may also contact your provider using MyChart. We now offer e-Visits for anyone 18 and older to request care online for non-urgent symptoms. For details visit [email or web address].   Also download the MyChart app! Go to the app store, search "MyChart", open the app, select [facility], and log in with your MyChart username and password.  Masks are optional in the cancer centers. If you would like for your care team to wear a mask while they are taking care of you, please let them know. For doctor visits, patients may have with them one support person who is at least 82 years old. At this time, visitors are not allowed in the infusion area. 

## 2022-03-24 ENCOUNTER — Encounter: Payer: Self-pay | Admitting: Hematology

## 2022-03-25 ENCOUNTER — Other Ambulatory Visit: Payer: Self-pay | Admitting: Nurse Practitioner

## 2022-03-25 DIAGNOSIS — E039 Hypothyroidism, unspecified: Secondary | ICD-10-CM

## 2022-03-25 LAB — CANCER ANTIGEN 19-9: CA 19-9: 23 U/mL (ref 0–35)

## 2022-03-26 ENCOUNTER — Other Ambulatory Visit (HOSPITAL_COMMUNITY): Payer: Self-pay

## 2022-03-30 ENCOUNTER — Other Ambulatory Visit (HOSPITAL_COMMUNITY): Payer: Self-pay

## 2022-04-01 ENCOUNTER — Other Ambulatory Visit (HOSPITAL_COMMUNITY): Payer: Self-pay

## 2022-04-02 ENCOUNTER — Other Ambulatory Visit (HOSPITAL_COMMUNITY): Payer: Self-pay

## 2022-04-04 ENCOUNTER — Other Ambulatory Visit: Payer: Self-pay | Admitting: Hematology

## 2022-04-06 ENCOUNTER — Other Ambulatory Visit: Payer: Medicare HMO

## 2022-04-06 ENCOUNTER — Ambulatory Visit: Payer: Medicare HMO

## 2022-04-07 ENCOUNTER — Encounter: Payer: Self-pay | Admitting: Hematology

## 2022-04-07 ENCOUNTER — Inpatient Hospital Stay: Payer: Medicare HMO

## 2022-04-07 ENCOUNTER — Other Ambulatory Visit: Payer: Self-pay

## 2022-04-07 ENCOUNTER — Other Ambulatory Visit (HOSPITAL_COMMUNITY): Payer: Self-pay

## 2022-04-07 ENCOUNTER — Inpatient Hospital Stay: Payer: Medicare HMO | Admitting: Nutrition

## 2022-04-07 ENCOUNTER — Inpatient Hospital Stay (HOSPITAL_BASED_OUTPATIENT_CLINIC_OR_DEPARTMENT_OTHER): Payer: Medicare HMO | Admitting: Hematology

## 2022-04-07 VITALS — BP 130/75 | HR 51 | Resp 18

## 2022-04-07 VITALS — BP 128/73 | HR 72 | Temp 97.7°F | Resp 18 | Ht 70.8 in | Wt 138.8 lb

## 2022-04-07 DIAGNOSIS — C251 Malignant neoplasm of body of pancreas: Secondary | ICD-10-CM | POA: Diagnosis not present

## 2022-04-07 DIAGNOSIS — D509 Iron deficiency anemia, unspecified: Secondary | ICD-10-CM | POA: Diagnosis not present

## 2022-04-07 DIAGNOSIS — C250 Malignant neoplasm of head of pancreas: Secondary | ICD-10-CM

## 2022-04-07 DIAGNOSIS — Z5111 Encounter for antineoplastic chemotherapy: Secondary | ICD-10-CM | POA: Diagnosis not present

## 2022-04-07 DIAGNOSIS — Z95828 Presence of other vascular implants and grafts: Secondary | ICD-10-CM

## 2022-04-07 LAB — CMP (CANCER CENTER ONLY)
ALT: 14 U/L (ref 0–44)
AST: 22 U/L (ref 15–41)
Albumin: 3.8 g/dL (ref 3.5–5.0)
Alkaline Phosphatase: 89 U/L (ref 38–126)
Anion gap: 5 (ref 5–15)
BUN: 18 mg/dL (ref 8–23)
CO2: 28 mmol/L (ref 22–32)
Calcium: 8.8 mg/dL — ABNORMAL LOW (ref 8.9–10.3)
Chloride: 104 mmol/L (ref 98–111)
Creatinine: 0.96 mg/dL (ref 0.61–1.24)
GFR, Estimated: >60 mL/min
Glucose, Bld: 137 mg/dL — ABNORMAL HIGH (ref 70–99)
Potassium: 4 mmol/L (ref 3.5–5.1)
Sodium: 137 mmol/L (ref 135–145)
Total Bilirubin: 0.6 mg/dL (ref 0.3–1.2)
Total Protein: 6.5 g/dL (ref 6.5–8.1)

## 2022-04-07 LAB — CBC WITH DIFFERENTIAL (CANCER CENTER ONLY)
Abs Immature Granulocytes: 0.02 10*3/uL (ref 0.00–0.07)
Basophils Absolute: 0 10*3/uL (ref 0.0–0.1)
Basophils Relative: 0 %
Eosinophils Absolute: 0.1 10*3/uL (ref 0.0–0.5)
Eosinophils Relative: 2 %
HCT: 26.1 % — ABNORMAL LOW (ref 39.0–52.0)
Hemoglobin: 8.5 g/dL — ABNORMAL LOW (ref 13.0–17.0)
Immature Granulocytes: 0 %
Lymphocytes Relative: 26 %
Lymphs Abs: 1.4 10*3/uL (ref 0.7–4.0)
MCH: 27 pg (ref 26.0–34.0)
MCHC: 32.6 g/dL (ref 30.0–36.0)
MCV: 82.9 fL (ref 80.0–100.0)
Monocytes Absolute: 0.8 10*3/uL (ref 0.1–1.0)
Monocytes Relative: 14 %
Neutro Abs: 3.2 10*3/uL (ref 1.7–7.7)
Neutrophils Relative %: 58 %
Platelet Count: 186 10*3/uL (ref 150–400)
RBC: 3.15 MIL/uL — ABNORMAL LOW (ref 4.22–5.81)
RDW: 25.2 % — ABNORMAL HIGH (ref 11.5–15.5)
WBC Count: 5.5 10*3/uL (ref 4.0–10.5)
nRBC: 0 % (ref 0.0–0.2)

## 2022-04-07 MED ORDER — SODIUM CHLORIDE 0.9 % IV SOLN
800.0000 mg/m2 | Freq: Once | INTRAVENOUS | Status: AC
  Administered 2022-04-07: 1406 mg via INTRAVENOUS
  Filled 2022-04-07: qty 36.98

## 2022-04-07 MED ORDER — HEPARIN SOD (PORK) LOCK FLUSH 100 UNIT/ML IV SOLN
500.0000 [IU] | Freq: Once | INTRAVENOUS | Status: AC | PRN
  Administered 2022-04-07: 500 [IU]

## 2022-04-07 MED ORDER — SODIUM CHLORIDE 0.9% FLUSH
10.0000 mL | INTRAVENOUS | Status: DC | PRN
  Administered 2022-04-07: 10 mL

## 2022-04-07 MED ORDER — SODIUM CHLORIDE 0.9 % IV SOLN: Freq: Once | INTRAVENOUS | Status: DC

## 2022-04-07 MED ORDER — SODIUM CHLORIDE 0.9 % IV SOLN: Freq: Once | INTRAVENOUS | Status: AC

## 2022-04-07 MED ORDER — PROCHLORPERAZINE MALEATE 10 MG PO TABS
10.0000 mg | ORAL_TABLET | Freq: Once | ORAL | Status: AC
  Administered 2022-04-07: 10 mg via ORAL
  Filled 2022-04-07: qty 1

## 2022-04-07 NOTE — Progress Notes (Signed)
[facility]   [phone] Fax:(336) [phone]   Clinic Follow up Note   Patient Care Team: Ronnell Freshwater, NP as PCP - General (Family Medicine) Buford Dresser, MD as PCP - Cardiology (Cardiology) Truitt Merle, MD as Consulting Physician (Oncology) Royston Bake, RN as Oncology Nurse Navigator (Oncology)  Date of Service:  04/07/2022  CHIEF COMPLAINT: f/u of pancreatic cancer  CURRENT THERAPY:  Gemcitabine, days 1 and 8 q21d, starting 01/20/22  ASSESSMENT & PLAN:  Angel Sheerin. is a 81 y.o. male with   1. 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 EUS and pancreatic mass biopsy on 08/05/21 confirmed adenocarcinoma. -Pancreatic protocol CT scan showed 3.6 x 2.6 cm pancreatic head mass, partially involving multiple vessels, including SMV, portal vein, and hepatic artery.  -he began neoadjuvant gemcitabine alone on 08/20/21. He was admitted for fever after cycle 1. He tolerated cycle 2 with gem and abraxane poorly overall with fatigue, low appetite, and a mild intermittent nausea. -neoadjuvant chemo stopped due to poor tolerance and he proceeded to whipple procedure on 11/15/21 with Dr. Zenia Resides. 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 Margins and lymph nodes negative. -he restarted adjuvant gemcitabine on 01/20/22, plan for 5 months. He tolerated well, no sickness or fatigue. Will monitor. -labs reviewed, improved with GCF-S on day 10-- WBC 13.2, hgb 8.5, ANC 9.8. Will continue.   2. LE edema and erythema -he developed erythema yesterday. Physical exam shows mild tenderness and warmth. I called in lasix for him to start.   3. Diarrhea, abdominal cramping and weight loss  -secondary to pancreatic surgery -he started creon 01/04/22 -weight is up some, 136 lbs today (03/23/22)   4. HTN -he is holding his BP  medicines during chemotherapy.   5. Microcytic anemia -Iron panel 01/20/22 confirmed iron deficiency anemia-- ferritin 9, iron serum 25.  -prescribed ferrous sulfate 325 mg that day -hgb stable at 8.5 today (03/23/22)     PLAN -proceed with gem today -lab, flush, and gemcitabine in 1 week with injection 2 days after  -f/u in 3 weeks before next cycle    No problem-specific Assessment & Plan notes found for this encounter.   SUMMARY OF ONCOLOGIC HISTORY: Oncology History Overview Note   Cancer Staging  Pancreatic cancer [facility] Staging form: Exocrine Pancreas, AJCC 8th Edition - Clinical stage from 08/05/2021: Stage IB (cT2, cN0, cM0) - Signed by Truitt Merle, MD on 08/17/2021 Stage prefix: Initial diagnosis Total positive nodes: 0     Pancreatic mass  07/18/2021 Initial Diagnosis   Pancreatic mass   07/18/2021 Imaging   CT Abdomen Pelvis W Contrast  IMPRESSION: 1. Suspected solid mass within the proximal body of the pancreas measures 2.3 x 2.6 x 2.0 cm. There is downstream dilation of the main pancreatic duct and its branches. There is also intra and extrahepatic biliary ductal dilation. Further evaluation with MRI of the abdomen, pancreatic protocol, when clinically feasible, may be considered. 2. 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 3. Aortic atherosclerosis   07/20/2021 Procedure   DG ERCP  IMPRESSION: Nondiagnostic ERCP as above. Correlation with the operative report is advised.  - The examination was suspicious for a biliary stricture in the bile duct. - Examination was suspicious for carcinoma of the head of the pancreas. - Attempts at a cholangiogram failed.   07/22/2021 Pathology Results   CASE: [id number]  FINAL MICROSCOPIC DIAGNOSIS:  - Suspicious for malignancy  - See comment   DIAGNOSTIC COMMENTS:  There are rare cells with cytologic atypia suspicious for  adenocarcinoma.  Dr. Vic Ripper agrees.    07/22/2021 Procedure   IR  INT EXT BILIARY DRAIN WITH CHOLANGIOGRAM ( IMPRESSION: 1. 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 2. Successful brush biopsy of obstructing lesion x3. 3. Successful placement of a 10 French internal/external biliary drainage catheter.   PLAN: 1. Follow bilirubin. When significant downward trend is clear, the bag should be capped. Recommend capping bag before discharge home if possible. 2. Follow-up in IR in 4-6 weeks for initial biliary tube check and exchange. If initial brush biopsies are negative, repeat biopsy could be considered at that time.   Pancreatic cancer ([place])  08/05/2021 Cancer Staging   Staging form: Exocrine Pancreas, AJCC 8th Edition - Clinical stage from 08/05/2021: Stage IB (cT2, cN0, cM0) - Signed by Truitt Merle, MD on 08/17/2021 Stage prefix: Initial diagnosis Total positive nodes: 0   08/14/2021 Initial Diagnosis   Pancreatic cancer ([place])   08/20/2021 - 03/23/2022 Chemotherapy   Patient is on Treatment Plan : PANCREATIC Abraxane / Gemcitabine D1,8 q21d     08/20/2021 -  Chemotherapy   Patient is on Treatment Plan : PANCREAS Gemcitabine D1,8 (1000) q21d x 8 Cycles     11/15/2021 Cancer Staging   Staging form: Exocrine Pancreas, AJCC 8th Edition - Pathologic stage from 11/15/2021: Stage IB (ypT2, pN0, cM0) - Signed by Truitt Merle, MD on 01/03/2022 Stage prefix: Post-therapy Total positive nodes: 0 Histologic grade (G): G3 Histologic grading system: 3 grade system Residual tumor (R): R0 - None   Pancreatic adenocarcinoma ([place])  11/15/2021 Initial Diagnosis   Pancreatic adenocarcinoma ([place])      INTERVAL HISTORY:  Angel Celeste Sr. is here for a follow up of pancreatic cancer. He was last seen by me on 03/23/22. He presents to the clinic accompanied by his daughter. He reports he is doing well and eating better.   All other systems were reviewed with the patient and are negative.  MEDICAL HISTORY:   Past Medical History:  Diagnosis Date   Aneurysm ([place])    2017   Anxiety    Arthritis    Depression    Family history of breast cancer    Family history of lung cancer    Headache    HLD (hyperlipidemia)    Hx of inguinal hernia repair    Hypertension    Hypothyroidism    Overactive bladder    Pancreatic cancer (H[facility])    Pneumonia    Shingles    Sleep apnea    Patient denies   Thoracic ascending aortic aneurysm ([place])    mildly dilated 4.0cm per 08/12/21 CT   Thyroid disease    Varicose veins of both lower extremities     SURGICAL HISTORY: Past Surgical History:  Procedure Laterality Date   BILATERAL CARPAL TUNNEL RELEASE     CATARACT EXTRACTION, BILATERAL Bilateral    ELBOW SURGERY Bilateral    ENDOSCOPIC RETROGRADE CHOLANGIOPANCREATOGRAPHY (ERCP) WITH PROPOFOL N/A 07/20/2021   Procedure: ENDOSCOPIC RETROGRADE CHOLANGIOPANCREATOGRAPHY (ERCP) WITH PROPOFOL;  Surgeon: Gatha Mayer, MD;  Location: [facility] ENDOSCOPY;  Service: Endoscopy;  Laterality: N/A;   ESOPHAGOGASTRODUODENOSCOPY (EGD) WITH PROPOFOL N/A 08/05/2021   Procedure: ESOPHAGOGASTRODUODENOSCOPY (EGD) WITH PROPOFOL;  Surgeon: Rush Landmark Telford Nab., MD;  Location: [place];  Service: Gastroenterology;  Laterality: N/A;   EUS N/A 08/05/2021   Procedure:  UPPER ENDOSCOPIC ULTRASOUND (EUS) RADIAL;  Surgeon: Rush Landmark Telford Nab., MD;  Location: [place];  Service: Gastroenterology;  Laterality: N/A;   EYE SURGERY     bilateral cataracts   FINE NEEDLE ASPIRATION  08/05/2021   Procedure: FINE NEEDLE ASPIRATION (FNA) LINEAR;  Surgeon: Irving Copas., MD;  Location: [place];  Service: Gastroenterology;;   INGUINAL HERNIA REPAIR Right 1951   IR ANGIO INTRA EXTRACRAN SEL COM CAROTID INNOMINATE BILAT MOD SED  02/20/2019   IR BILIARY STENT(S) EXISTING ACCESS INC DILATION CATH EXCHANGE  09/20/2021   IR ENDOLUMINAL BX OF BILIARY TREE  07/22/2021   IR GENERIC HISTORICAL  10/28/2016   IR RADIOLOGIST EVAL  & MGMT 10/28/2016 [facility]INTER[facility] RAD   IR INT EXT BILIARY DRAIN WITH CHOLANGIOGRAM  07/22/2021   JOINT REPLACEMENT     LAPAROSCOPY N/A 11/15/2021   Procedure: LAPAROSCOPIC DIAGNOSTIC STAGING;  Surgeon: Dwan Bolt, MD;  Location: [place];  Service: General;  Laterality: N/A;   PORTACATH PLACEMENT N/A 08/19/2021   Procedure: INSERTION PORT-A-CATH;  Surgeon: Dwan Bolt, MD;  Location: [facility];  Service: General;  Laterality: N/A;  LMA   RADIOLOGY WITH ANESTHESIA N/A 10/21/2015   Procedure: RADIOLOGY WITH ANESTHESIA;  Surgeon: Luanne Bras, MD;  Location: [place];  Service: Radiology;  Laterality: N/A;   RADIOLOGY WITH ANESTHESIA N/A 02/20/2019   Procedure: [employer];  Surgeon: Luanne Bras, MD;  Location: [place];  Service: Radiology;  Laterality: N/A;   ROTATOR CUFF REPAIR Bilateral    VARICOSE VEIN SURGERY     WHIPPLE PROCEDURE N/A 11/15/2021   Procedure: WHIPPLE PROCEDURE;  Surgeon: Dwan Bolt, MD;  Location: [place];  Service: General;  Laterality: N/A;    I have reviewed the social history and family history with the patient and they are unchanged from previous note.  ALLERGIES:  has No Known Allergies.  MEDICATIONS:  Current Outpatient Medications  Medication Sig Dispense Refill   acetaminophen (TYLENOL) 500 MG tablet Take 1 tablet (500 mg total) by mouth every 6 (six) hours as needed for mild pain. 30 tablet 0   apixaban (ELIQUIS) 5 MG TABS tablet Take 1 tablet (5 mg total) by mouth 2 (two) times daily. 60 tablet 2   Cholecalciferol (VITAMIN D3) 50 MCG (2000 UT) TABS Take 2,000 Units by mouth daily.     dexamethasone (DECADRON) 4 MG tablet TAKE 1 TABLET (4 MG TOTAL) BY MOUTH DAILY. FOR 3 TO 5 DAYS AFTER CHEMO FOR NAUSEA AND FATIGUE 15 tablet 0   diltiazem (CARDIZEM) 30 MG tablet Take 1 tablet (30 mg total) by mouth 3 (three) times daily. 90 tablet 2   docusate sodium (COLACE) 100 MG capsule Take 1 capsule (100 mg total) by mouth 2 (two) times daily. (Patient taking  differently: Take 100 mg by mouth 2 (two) times daily as needed.) 28 capsule 0   escitalopram (LEXAPRO) 20 MG tablet Take 1 tablet (20 mg total) by mouth daily. 90 tablet 1   famotidine (PEPCID) 20 MG tablet Take 1 tablet (20 mg total) by mouth 2 (two) times daily. 60 tablet 1   feeding supplement (ENSURE ENLIVE / ENSURE PLUS) LIQD Take 237 mLs by mouth 3 (three) times daily between meals. 237 mL 12   ferrous sulfate 325 (65 FE) MG EC tablet Take 1 tablet (325 mg total) by mouth daily with breakfast. 30 tablet 3   levothyroxine (SYNTHROID) 112 MCG tablet TAKE 1 TABLET (112 MCG TOTAL) BY MOUTH DAILY BEFORE BREAKFAST. 90 tablet 1  lovastatin (MEVACOR) 20 MG tablet Take 20 mg by mouth at bedtime.     mirtazapine (REMERON) 7.5 MG tablet Take 1 tablet (7.5 mg total) by mouth at bedtime. 90 tablet 0   Multiple Vitamin (MULTIVITAMIN WITH MINERALS) TABS tablet Take 1 tablet by mouth daily. 30 tablet 0   nystatin-diphenhydrAMINE-alum & mag hydroxide-simeth Take 5 MLs by mouth 3 (three) times daily as needed for mouth pain. 120 mL 1   ondansetron (ZOFRAN) 4 MG tablet Take 1 tablet (4 mg total) by mouth every 8 (eight) hours as needed for nausea or vomiting. (Patient taking differently: Take 8 mg by mouth every 8 (eight) hours as needed for nausea or vomiting.) 20 tablet 0   pantoprazole (PROTONIX) 40 MG tablet Take 1 tablet by mouth daily. 90 tablet 0   polyethylene glycol (MIRALAX / GLYCOLAX) 17 g packet Take 17 g by mouth daily as needed for moderate constipation. 14 each 0   vitamin B-12 (CYANOCOBALAMIN) 1000 MCG tablet Take 1,000 mcg by mouth 2 (two) times daily.     No current facility-administered medications for this visit.    PHYSICAL EXAMINATION: ECOG PERFORMANCE STATUS: 1 - Symptomatic but completely ambulatory  Vitals:   04/07/22 1235  BP: 128/73  Pulse: 72  Resp: 18  Temp: 97.7 F (36.5 C)  SpO2: 99%   Wt Readings from Last 3 Encounters:  04/07/22 138 lb 12.8 oz (63 kg)  03/23/22  136 lb 4.8 oz (61.8 kg)  03/09/22 132 lb 4 oz (60 kg)     GENERAL:alert, no distress and comfortable SKIN: skin color normal, no rashes or significant lesions EYES: normal, Conjunctiva are pink and non-injected, sclera clear  NEURO: alert & oriented x 3 with fluent speech  LABORATORY DATA:  I have reviewed the data as listed    Latest Ref Rng & Units 04/07/2022   12:19 PM 03/23/2022   12:08 PM 03/09/2022   12:22 PM  CBC  WBC 4.0 - 10.5 K/uL 5.5  13.2  4.1   Hemoglobin 13.0 - 17.0 g/dL 8.5  8.5  8.6   Hematocrit 39.0 - 52.0 % 26.1  26.2  26.1   Platelets 150 - 400 K/uL 186  309  300         Latest Ref Rng & Units 04/07/2022   12:19 PM 03/23/2022   12:08 PM 03/09/2022   12:22 PM  CMP  Glucose 70 - 99 mg/dL 137  95  97   BUN 8 - 23 mg/dL '18  21  20   ' Creatinine 0.61 - 1.24 mg/dL 0.96  1.19  1.10   Sodium 135 - 145 mmol/L 137  139  136   Potassium 3.5 - 5.1 mmol/L 4.0  4.1  4.1   Chloride 98 - 111 mmol/L 104  105  102   CO2 22 - 32 mmol/L '28  30  30   ' Calcium 8.9 - 10.3 mg/dL 8.8  8.9  8.7   Total Protein 6.5 - 8.1 g/dL 6.5  6.7  6.5   Total Bilirubin 0.3 - 1.2 mg/dL 0.6  0.3  0.6   Alkaline Phos 38 - 126 U/L 89  114  81   AST 15 - 41 U/L '22  27  31   ' ALT 0 - 44 U/L '14  18  26       ' RADIOGRAPHIC STUDIES: I have personally reviewed the radiological images as listed and agreed with the findings in the report. No results found.    Orders  Placed This Encounter  Procedures   CBC with Differential ([place] Only)    Standing Status:   Future    Standing Expiration Date:   04/29/2023   CMP ([place] only)    Standing Status:   Future    Standing Expiration Date:   04/29/2023   CBC with Differential ([place] Only)    Standing Status:   Future    Standing Expiration Date:   05/06/2023   CMP ([place] only)    Standing Status:   Future    Standing Expiration Date:   05/06/2023   CBC with Differential ([place] Only)    Standing Status:   Future     Standing Expiration Date:   04/08/2023   CMP ([place] only)    Standing Status:   Future    Standing Expiration Date:   04/08/2023   CBC with Differential ([place] Only)    Standing Status:   Future    Standing Expiration Date:   04/15/2023   CMP ([place] only)    Standing Status:   Future    Standing Expiration Date:   04/15/2023   All questions were answered. The patient knows to call the clinic with any problems, questions or concerns. No barriers to learning was detected. The total time spent in the appointment was 30 minutes.     Truitt Merle, MD 04/07/2022   I, Wilburn Mylar, am acting as scribe for Truitt Merle, MD.   I have reviewed the above documentation for accuracy and completeness, and I agree with the above.

## 2022-04-07 NOTE — Progress Notes (Signed)
Nutrition follow-up completed with patient in infusion receiving treatment for pancreas cancer.  Weight improved and documented as 138 pounds 12.8 ounces on August 24.  This is increased from 128 pounds 5 ounces July 20. Patient reports appetite is good and he is eating well. He continues to deny nutrition impact symptoms. He is drinking Ensure Plus high-protein.  He would like an additional case today.  Nutrition diagnosis: Severe malnutrition, improving.  Intervention: Continue small frequent meals with snacks. Continue Ensure Plus high-protein twice daily. Provided 1 complementary case of Ensure Plus high-protein.  Monitoring, evaluation, goals: Patient will tolerate increased calories and protein to minimize weight loss and promote weight gain.  Next visit: To be scheduled as needed.  **Disclaimer: This note was dictated with voice recognition software. Similar sounding words can inadvertently be transcribed and this note may contain transcription errors which may not have been corrected upon publication of note.**

## 2022-04-07 NOTE — Patient Instructions (Signed)
[facility] MEDICAL [facility]COLOG[facility]  Discharge Instructions: Thank you for choosing [facility] to provide your oncology and hematology care.   If you have a lab appointment with the Cancer Center, please go directly to the Cancer Center and check in at the registration area.   Wear comfortable clothing and clothing appropriate for easy access to any Portacath or PICC line.   We strive to give you quality time with your provider. You may need to reschedule your appointment if you arrive late (15 or more minutes).  Arriving late affects you and other patients whose appointments are after yours.  Also, if you miss three or more appointments without notifying the office, you may be dismissed from the clinic at the provider's discretion.      For prescription refill requests, have your pharmacy contact our office and allow 72 hours for refills to be completed.    Today you received the following chemotherapy and/or immunotherapy agents: Gemcitabine.       To help prevent nausea and vomiting after your treatment, we encourage you to take your nausea medication as directed.  BELOW ARE SYMPTOMS THAT SHOULD BE REPORTED IMMEDIATELY: *FEVER GREATER THAN 100.4 F (38 C) OR HIGHER *CHILLS OR SWEATING *NAUSEA AND VOMITING THAT IS NOT CONTROLLED WITH YOUR NAUSEA MEDICATION *UNUSUAL SHORTNESS OF BREATH *UNUSUAL BRUISING OR BLEEDING *URINARY PROBLEMS (pain or burning when urinating, or frequent urination) *BOWEL PROBLEMS (unusual diarrhea, constipation, pain near the anus) TENDERNESS IN MOUTH AND THROAT WITH OR WITHOUT PRESENCE OF ULCERS (sore throat, sores in mouth, or a toothache) UNUSUAL RASH, SWELLING OR PAIN  UNUSUAL VAGINAL DISCHARGE OR ITCHING   Items with * indicate a potential emergency and should be followed up as soon as possible or go to the Emergency Department if any problems should occur.  Please show the CHEMOTHERAPY ALERT CARD or IMMUNOTHERAPY ALERT CARD at check-in  to the Emergency Department and triage nurse.  Should you have questions after your visit or need to cancel or reschedule your appointment, please contact [facility]ICAL [facility]COLOG[facility]  Dept: [phone]  and follow the prompts.  Office hours are 8:00 a.m. to 4:30 p.m. Monday - Friday. Please note that voicemails left after 4:00 p.m. may not be returned until the following business day.  We are closed weekends and major holidays. You have access to a nurse at all times for urgent questions. Please call the main number to the clinic Dept: [phone] and follow the prompts.   For any non-urgent questions, you may also contact your provider using MyChart. We now offer e-Visits for anyone 18 and older to request care online for non-urgent symptoms. For details visit [email or web address].   Also download the MyChart app! Go to the app store, search "MyChart", open the app, select [facility], and log in with your MyChart username and password.  Masks are optional in the cancer centers. If you would like for your care team to wear a mask while they are taking care of you, please let them know. You may have one support person who is at least 82 years old accompany you for your appointments. 

## 2022-04-11 ENCOUNTER — Telehealth: Payer: Self-pay

## 2022-04-11 NOTE — Telephone Encounter (Signed)
Pt's daughter called stating that the pt has ran out of his Creon and they do not have money to get the prescription refilled.  Pt's daughter wanted to know if there is anything that Dr. Burr Medico can do to assist in obtaining the pt's medication.  Sent staff message to Dr. Burr Medico, to both financial counselors, Estelle Grumbles, and to Trilby Leaver to see if they can assist.

## 2022-04-12 ENCOUNTER — Telehealth: Payer: Self-pay | Admitting: Hematology

## 2022-04-12 ENCOUNTER — Other Ambulatory Visit (HOSPITAL_COMMUNITY): Payer: Self-pay

## 2022-04-12 NOTE — Telephone Encounter (Signed)
Left message with follow-up appointments per 8/24 los.

## 2022-04-13 ENCOUNTER — Other Ambulatory Visit: Payer: Self-pay

## 2022-04-13 ENCOUNTER — Other Ambulatory Visit: Payer: Medicare HMO

## 2022-04-13 ENCOUNTER — Inpatient Hospital Stay: Payer: Medicare HMO

## 2022-04-13 ENCOUNTER — Inpatient Hospital Stay: Payer: Medicare HMO | Admitting: Hematology

## 2022-04-13 ENCOUNTER — Ambulatory Visit: Payer: Medicare HMO | Admitting: Hematology

## 2022-04-13 ENCOUNTER — Ambulatory Visit: Payer: Medicare HMO

## 2022-04-13 VITALS — BP 140/63 | HR 44 | Temp 98.3°F | Resp 18 | Wt 142.5 lb

## 2022-04-13 DIAGNOSIS — C251 Malignant neoplasm of body of pancreas: Secondary | ICD-10-CM

## 2022-04-13 DIAGNOSIS — Z5111 Encounter for antineoplastic chemotherapy: Secondary | ICD-10-CM | POA: Diagnosis not present

## 2022-04-13 DIAGNOSIS — Z95828 Presence of other vascular implants and grafts: Secondary | ICD-10-CM

## 2022-04-13 DIAGNOSIS — C250 Malignant neoplasm of head of pancreas: Secondary | ICD-10-CM | POA: Diagnosis not present

## 2022-04-13 DIAGNOSIS — D509 Iron deficiency anemia, unspecified: Secondary | ICD-10-CM | POA: Diagnosis not present

## 2022-04-13 LAB — CMP (CANCER CENTER ONLY)
ALT: 21 U/L (ref 0–44)
AST: 26 U/L (ref 15–41)
Albumin: 3.7 g/dL (ref 3.5–5.0)
Alkaline Phosphatase: 80 U/L (ref 38–126)
Anion gap: 4 — ABNORMAL LOW (ref 5–15)
BUN: 23 mg/dL (ref 8–23)
CO2: 31 mmol/L (ref 22–32)
Calcium: 9.1 mg/dL (ref 8.9–10.3)
Chloride: 102 mmol/L (ref 98–111)
Creatinine: 1.2 mg/dL (ref 0.61–1.24)
GFR, Estimated: >60 mL/min (ref >60)
Glucose, Bld: 97 mg/dL (ref 70–99)
Potassium: 4.1 mmol/L (ref 3.5–5.1)
Sodium: 137 mmol/L (ref 135–145)
Total Bilirubin: 0.4 mg/dL (ref 0.3–1.2)
Total Protein: 6.3 g/dL — ABNORMAL LOW (ref 6.5–8.1)

## 2022-04-13 LAB — CBC WITH DIFFERENTIAL (CANCER CENTER ONLY)
Abs Immature Granulocytes: 0.02 10*3/uL (ref 0.00–0.07)
Basophils Absolute: 0 10*3/uL (ref 0.0–0.1)
Basophils Relative: 0 %
Eosinophils Absolute: 0 10*3/uL (ref 0.0–0.5)
Eosinophils Relative: 1 %
HCT: 25.4 % — ABNORMAL LOW (ref 39.0–52.0)
Hemoglobin: 8.4 g/dL — ABNORMAL LOW (ref 13.0–17.0)
Immature Granulocytes: 0 %
Lymphocytes Relative: 26 %
Lymphs Abs: 1.2 10*3/uL (ref 0.7–4.0)
MCH: 27.5 pg (ref 26.0–34.0)
MCHC: 33.1 g/dL (ref 30.0–36.0)
MCV: 83 fL (ref 80.0–100.0)
Monocytes Absolute: 0.1 10*3/uL (ref 0.1–1.0)
Monocytes Relative: 3 %
Neutro Abs: 3.1 10*3/uL (ref 1.7–7.7)
Neutrophils Relative %: 70 %
Platelet Count: 324 10*3/uL (ref 150–400)
RBC: 3.06 MIL/uL — ABNORMAL LOW (ref 4.22–5.81)
RDW: 23.9 % — ABNORMAL HIGH (ref 11.5–15.5)
WBC Count: 4.5 10*3/uL (ref 4.0–10.5)
nRBC: 0 % (ref 0.0–0.2)

## 2022-04-13 MED ORDER — SODIUM CHLORIDE 0.9% FLUSH
10.0000 mL | INTRAVENOUS | Status: DC | PRN
  Administered 2022-04-13: 10 mL

## 2022-04-13 MED ORDER — SODIUM CHLORIDE 0.9 % IV SOLN
800.0000 mg/m2 | Freq: Once | INTRAVENOUS | Status: AC
  Administered 2022-04-13: 1406 mg via INTRAVENOUS
  Filled 2022-04-13: qty 36.98

## 2022-04-13 MED ORDER — HEPARIN SOD (PORK) LOCK FLUSH 100 UNIT/ML IV SOLN
500.0000 [IU] | Freq: Once | INTRAVENOUS | Status: AC | PRN
  Administered 2022-04-13: 500 [IU]

## 2022-04-13 MED ORDER — PROCHLORPERAZINE MALEATE 10 MG PO TABS
10.0000 mg | ORAL_TABLET | Freq: Once | ORAL | Status: AC
  Administered 2022-04-13: 10 mg via ORAL
  Filled 2022-04-13: qty 1

## 2022-04-13 MED ORDER — SODIUM CHLORIDE 0.9 % IV SOLN: Freq: Once | INTRAVENOUS | Status: AC

## 2022-04-13 NOTE — Progress Notes (Signed)
HR 44, Dr. Burr Medico notified and EKG obtained. EKG shows sinus brady, Dr Burr Medico is okay to proceed with treatment today.

## 2022-04-13 NOTE — Patient Instructions (Signed)
[facility]COL[facility]  Discharge Instructions: Thank you for choosing [facility] to provide your oncology and hematology care.   If you have a lab appointment with the Cancer Center, please go directly to the Cancer Center and check in at the registration area.   Wear comfortable clothing and clothing appropriate for easy access to any Portacath or PICC line.   We strive to give you quality time with your provider. You may need to reschedule your appointment if you arrive late (15 or more minutes).  Arriving late affects you and other patients whose appointments are after yours.  Also, if you miss three or more appointments without notifying the office, you may be dismissed from the clinic at the provider's discretion.      For prescription refill requests, have your pharmacy contact our office and allow 72 hours for refills to be completed.    Today you received the following chemotherapy and/or immunotherapy agents: Gemcitabine.       To help prevent nausea and vomiting after your treatment, we encourage you to take your nausea medication as directed.  BELOW ARE SYMPTOMS THAT SHOULD BE REPORTED IMMEDIATELY: *FEVER GREATER THAN 100.4 F (38 C) OR HIGHER *CHILLS OR SWEATING *NAUSEA AND VOMITING THAT IS NOT CONTROLLED WITH YOUR NAUSEA MEDICATION *UNUSUAL SHORTNESS OF BREATH *UNUSUAL BRUISING OR BLEEDING *URINARY PROBLEMS (pain or burning when urinating, or frequent urination) *BOWEL PROBLEMS (unusual diarrhea, constipation, pain near the anus) TENDERNESS IN MOUTH AND THROAT WITH OR WITHOUT PRESENCE OF ULCERS (sore throat, sores in mouth, or a toothache) UNUSUAL RASH, SWELLING OR PAIN  UNUSUAL VAGINAL DISCHARGE OR ITCHING   Items with * indicate a potential emergency and should be followed up as soon as possible or go to the Emergency Department if any problems should occur.  Please show the CHEMOTHERAPY ALERT CARD or IMMUNOTHERAPY ALERT CARD at check-in  to the Emergency Department and triage nurse.  Should you have questions after your visit or need to cancel or reschedule your appointment, please contact [facility]COL[facility]  Dept: [phone]  and follow the prompts.  Office hours are 8:00 a.m. to 4:30 p.m. Monday - Friday. Please note that voicemails left after 4:00 p.m. may not be returned until the following business day.  We are closed weekends and major holidays. You have access to a nurse at all times for urgent questions. Please call the main number to the clinic Dept: [phone] and follow the prompts.   For any non-urgent questions, you may also contact your provider using MyChart. We now offer e-Visits for anyone 18 and older to request care online for non-urgent symptoms. For details visit [email or web address].   Also download the MyChart app! Go to the app store, search "MyChart", open the app, select [facility], and log in with your MyChart username and password.  Masks are optional in the cancer centers. If you would like for your care team to wear a mask while they are taking care of you, please let them know. You may have one support Havilah Topor who is at least 82 years old accompany you for your appointments. 

## 2022-04-14 LAB — CANCER ANTIGEN 19-9: CA 19-9: 13 U/mL (ref 0–35)

## 2022-04-15 ENCOUNTER — Other Ambulatory Visit: Payer: Self-pay

## 2022-04-15 ENCOUNTER — Inpatient Hospital Stay: Payer: Medicare HMO | Attending: Physician Assistant

## 2022-04-15 VITALS — BP 123/72 | HR 54 | Temp 98.4°F | Resp 16

## 2022-04-15 DIAGNOSIS — Z5189 Encounter for other specified aftercare: Secondary | ICD-10-CM | POA: Insufficient documentation

## 2022-04-15 DIAGNOSIS — Z5111 Encounter for antineoplastic chemotherapy: Secondary | ICD-10-CM | POA: Diagnosis not present

## 2022-04-15 DIAGNOSIS — C250 Malignant neoplasm of head of pancreas: Secondary | ICD-10-CM | POA: Insufficient documentation

## 2022-04-15 DIAGNOSIS — C251 Malignant neoplasm of body of pancreas: Secondary | ICD-10-CM

## 2022-04-15 MED ORDER — PEGFILGRASTIM-CBQV 6 MG/0.6ML ~~LOC~~ SOSY
6.0000 mg | PREFILLED_SYRINGE | Freq: Once | SUBCUTANEOUS | Status: AC
  Administered 2022-04-15: 6 mg via SUBCUTANEOUS
  Filled 2022-04-15: qty 0.6

## 2022-04-15 NOTE — Patient Instructions (Signed)

## 2022-04-22 ENCOUNTER — Other Ambulatory Visit: Payer: Self-pay

## 2022-04-22 ENCOUNTER — Telehealth: Payer: Self-pay | Admitting: Nurse Practitioner

## 2022-04-22 ENCOUNTER — Encounter: Payer: Self-pay | Admitting: Licensed Clinical Social Worker

## 2022-04-22 DIAGNOSIS — E782 Mixed hyperlipidemia: Secondary | ICD-10-CM

## 2022-04-22 DIAGNOSIS — C251 Malignant neoplasm of body of pancreas: Secondary | ICD-10-CM

## 2022-04-22 DIAGNOSIS — C259 Malignant neoplasm of pancreas, unspecified: Secondary | ICD-10-CM | POA: Diagnosis not present

## 2022-04-22 DIAGNOSIS — K8681 Exocrine pancreatic insufficiency: Secondary | ICD-10-CM | POA: Diagnosis not present

## 2022-04-22 MED ORDER — LOVASTATIN 20 MG PO TABS: 20.0000 mg | ORAL_TABLET | Freq: Every day | ORAL | 0 refills | Status: DC

## 2022-04-22 NOTE — Telephone Encounter (Signed)
Rx was sent to pharmacy. 

## 2022-04-22 NOTE — Telephone Encounter (Signed)
Patients daughter called and is wondering if you can refill a prescription of lovastatin '20MG'$ . Daughter states that DR Little prescribed the medication but he has retired since. Requesting a call back.

## 2022-04-22 NOTE — Progress Notes (Signed)
[facility] CSW Progress Note  Clinical Solicitor to [employer] on behalf of patient.    Application did not include a bill to be paid.  CSW requested in email to [employer] that they contact patient directly for a bill as it took some time to get the social security benefit statement and it would not have been beneficial to delay the application any longer.  CSW to remain available to assist pt as appropriate throughout duration of treatment.     Henriette Combs, LCSW

## 2022-04-28 ENCOUNTER — Inpatient Hospital Stay (HOSPITAL_BASED_OUTPATIENT_CLINIC_OR_DEPARTMENT_OTHER): Payer: Medicare HMO | Admitting: Hematology

## 2022-04-28 ENCOUNTER — Inpatient Hospital Stay: Payer: Medicare HMO

## 2022-04-28 VITALS — BP 144/72 | HR 60 | Temp 98.1°F | Resp 18 | Ht 70.8 in | Wt 132.6 lb

## 2022-04-28 DIAGNOSIS — Z5111 Encounter for antineoplastic chemotherapy: Secondary | ICD-10-CM | POA: Diagnosis not present

## 2022-04-28 DIAGNOSIS — C251 Malignant neoplasm of body of pancreas: Secondary | ICD-10-CM

## 2022-04-28 DIAGNOSIS — C250 Malignant neoplasm of head of pancreas: Secondary | ICD-10-CM | POA: Diagnosis not present

## 2022-04-28 DIAGNOSIS — Z95828 Presence of other vascular implants and grafts: Secondary | ICD-10-CM

## 2022-04-28 DIAGNOSIS — C259 Malignant neoplasm of pancreas, unspecified: Secondary | ICD-10-CM | POA: Diagnosis not present

## 2022-04-28 DIAGNOSIS — Z5189 Encounter for other specified aftercare: Secondary | ICD-10-CM | POA: Diagnosis not present

## 2022-04-28 LAB — CBC WITH DIFFERENTIAL (CANCER CENTER ONLY)
Abs Immature Granulocytes: 0.19 10*3/uL — ABNORMAL HIGH (ref 0.00–0.07)
Basophils Absolute: 0.1 10*3/uL (ref 0.0–0.1)
Basophils Relative: 0 %
Eosinophils Absolute: 0.2 10*3/uL (ref 0.0–0.5)
Eosinophils Relative: 1 %
HCT: 28.2 % — ABNORMAL LOW (ref 39.0–52.0)
Hemoglobin: 9.1 g/dL — ABNORMAL LOW (ref 13.0–17.0)
Immature Granulocytes: 1 %
Lymphocytes Relative: 10 %
Lymphs Abs: 1.5 10*3/uL (ref 0.7–4.0)
MCH: 28.2 pg (ref 26.0–34.0)
MCHC: 32.3 g/dL (ref 30.0–36.0)
MCV: 87.3 fL (ref 80.0–100.0)
Monocytes Absolute: 1.2 10*3/uL — ABNORMAL HIGH (ref 0.1–1.0)
Monocytes Relative: 8 %
Neutro Abs: 12.2 10*3/uL — ABNORMAL HIGH (ref 1.7–7.7)
Neutrophils Relative %: 80 %
Platelet Count: 337 10*3/uL (ref 150–400)
RBC: 3.23 MIL/uL — ABNORMAL LOW (ref 4.22–5.81)
RDW: 26.3 % — ABNORMAL HIGH (ref 11.5–15.5)
WBC Count: 15.3 10*3/uL — ABNORMAL HIGH (ref 4.0–10.5)
nRBC: 0 % (ref 0.0–0.2)

## 2022-04-28 LAB — CMP (CANCER CENTER ONLY)
ALT: 13 U/L (ref 0–44)
AST: 23 U/L (ref 15–41)
Albumin: 3.6 g/dL (ref 3.5–5.0)
Alkaline Phosphatase: 134 U/L — ABNORMAL HIGH (ref 38–126)
Anion gap: 6 (ref 5–15)
BUN: 18 mg/dL (ref 8–23)
CO2: 29 mmol/L (ref 22–32)
Calcium: 9.1 mg/dL (ref 8.9–10.3)
Chloride: 100 mmol/L (ref 98–111)
Creatinine: 1.18 mg/dL (ref 0.61–1.24)
GFR, Estimated: >60 mL/min (ref >60)
Glucose, Bld: 120 mg/dL — ABNORMAL HIGH (ref 70–99)
Potassium: 3.8 mmol/L (ref 3.5–5.1)
Sodium: 135 mmol/L (ref 135–145)
Total Bilirubin: 0.6 mg/dL (ref 0.3–1.2)
Total Protein: 6.3 g/dL — ABNORMAL LOW (ref 6.5–8.1)

## 2022-04-28 MED ORDER — SODIUM CHLORIDE 0.9 % IV SOLN
800.0000 mg/m2 | Freq: Once | INTRAVENOUS | Status: AC
  Administered 2022-04-28: 1406 mg via INTRAVENOUS
  Filled 2022-04-28: qty 36.98

## 2022-04-28 MED ORDER — SODIUM CHLORIDE 0.9% FLUSH
10.0000 mL | INTRAVENOUS | Status: DC | PRN
  Administered 2022-04-28: 10 mL

## 2022-04-28 MED ORDER — PROCHLORPERAZINE MALEATE 10 MG PO TABS
10.0000 mg | ORAL_TABLET | Freq: Once | ORAL | Status: AC
  Administered 2022-04-28: 10 mg via ORAL
  Filled 2022-04-28: qty 1

## 2022-04-28 MED ORDER — HEPARIN SOD (PORK) LOCK FLUSH 100 UNIT/ML IV SOLN
500.0000 [IU] | Freq: Once | INTRAVENOUS | Status: AC | PRN
  Administered 2022-04-28: 500 [IU]

## 2022-04-28 MED ORDER — SODIUM CHLORIDE 0.9 % IV SOLN: Freq: Once | INTRAVENOUS | Status: AC

## 2022-04-28 NOTE — Patient Instructions (Signed)
[facility] MEDICAL [facility]COLOGY   Discharge Instructions: Thank you for choosing [facility] to provide your oncology and hematology care.   If you have a lab appointment with the Cancer Center, please go directly to the Cancer Center and check in at the registration area.   Wear comfortable clothing and clothing appropriate for easy access to any Portacath or PICC line.   We strive to give you quality time with your provider. You may need to reschedule your appointment if you arrive late (15 or more minutes).  Arriving late affects you and other patients whose appointments are after yours.  Also, if you miss three or more appointments without notifying the office, you may be dismissed from the clinic at the provider's discretion.      For prescription refill requests, have your pharmacy contact our office and allow 72 hours for refills to be completed.    Today you received the following chemotherapy and/or immunotherapy agents: gemcitabine      To help prevent nausea and vomiting after your treatment, we encourage you to take your nausea medication as directed.  BELOW ARE SYMPTOMS THAT SHOULD BE REPORTED IMMEDIATELY: *FEVER GREATER THAN 100.4 F (38 C) OR HIGHER *CHILLS OR SWEATING *NAUSEA AND VOMITING THAT IS NOT CONTROLLED WITH YOUR NAUSEA MEDICATION *UNUSUAL SHORTNESS OF BREATH *UNUSUAL BRUISING OR BLEEDING *URINARY PROBLEMS (pain or burning when urinating, or frequent urination) *BOWEL PROBLEMS (unusual diarrhea, constipation, pain near the anus) TENDERNESS IN MOUTH AND THROAT WITH OR WITHOUT PRESENCE OF ULCERS (sore throat, sores in mouth, or a toothache) UNUSUAL RASH, SWELLING OR PAIN  UNUSUAL VAGINAL DISCHARGE OR ITCHING   Items with * indicate a potential emergency and should be followed up as soon as possible or go to the Emergency Department if any problems should occur.  Please show the CHEMOTHERAPY ALERT CARD or IMMUNOTHERAPY ALERT CARD at check-in  to the Emergency Department and triage nurse.  Should you have questions after your visit or need to cancel or reschedule your appointment, please contact [facility]  Dept: [phone]  and follow the prompts.  Office hours are 8:00 a.m. to 4:30 p.m. Monday - Friday. Please note that voicemails left after 4:00 p.m. may not be returned until the following business day.  We are closed weekends and major holidays. You have access to a nurse at all times for urgent questions. Please call the main number to the clinic Dept: [phone] and follow the prompts.   For any non-urgent questions, you may also contact your provider using MyChart. We now offer e-Visits for anyone 18 and older to request care online for non-urgent symptoms. For details visit [email or web address].   Also download the MyChart app! Go to the app store, search "MyChart", open the app, select [facility], and log in with your MyChart username and password.  Masks are optional in the cancer centers. If you would like for your care team to wear a mask while they are taking care of you, please let them know. You may have one support person who is at least 82 years old accompany you for your appointments. 

## 2022-04-28 NOTE — Progress Notes (Signed)
[facility]   Telephone:(336) [phone] Fax:(336) [phone]   Clinic Follow up Note   Patient Care Team: Ronnell Freshwater, NP as PCP - General (Family Medicine) Buford Dresser, MD as PCP - Cardiology (Cardiology) Truitt Merle, MD as Consulting Physician (Oncology) Royston Bake, RN as Oncology Nurse Navigator (Oncology)  Date of Service:  04/28/2022  CHIEF COMPLAINT: f/u of pancreatic cancer  CURRENT THERAPY:  Gemcitabine, days 1 and 8 q21d, starting 01/20/22  ASSESSMENT & PLAN:  Angel Keller. is a 81 y.o. male with   1. 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 EUS and pancreatic mass biopsy on 08/05/21 confirmed adenocarcinoma. -Pancreatic protocol CT scan showed 3.6 x 2.6 cm pancreatic head mass, partially involving multiple vessels, including SMV, portal vein, and hepatic artery.  -he began neoadjuvant gemcitabine alone on 08/20/21. He was admitted for fever after cycle 1. He tolerated cycle 2 with gem and abraxane poorly overall with fatigue, low appetite, and a mild intermittent nausea. -neoadjuvant chemo stopped due to poor tolerance and he proceeded to whipple procedure on 11/15/21 with Dr. Zenia Resides. 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 Margins and lymph nodes negative. -he restarted adjuvant gemcitabine on 01/20/22, plan for 5 months. Aside from recent weight loss, he tolerates well, no sickness or fatigue. Will monitor. -labs reviewed, improved with GCF-S on day 10-- WBC 15.3, hgb 9.1. Will continue. -he is due for restaging scan, plan to complete in next few weeks.   2. Diarrhea, abdominal cramping and weight loss  -secondary to pancreatic surgery -he started creon 01/04/22, not currently taking due to cost. They are working with manufacturer for assistance. -weight is down some today, he lost 10 lbs in 2  weeks. I encouraged him to restart protein shakes.   3. HTN -he is holding his BP medicines during chemotherapy.   4. Microcytic anemia -Iron panel 01/20/22 confirmed iron deficiency anemia-- ferritin 9, iron serum 25.  -prescribed ferrous sulfate 325 mg that day -hgb stable at 9.1 today (04/28/22)     PLAN -proceed with gem today -lab, flush, and gemcitabine in 1 week with injection 2 days after  -restaging CT to be done prior to next cycle -f/u in 3 weeks before next cycle    No problem-specific Assessment & Plan notes found for this encounter.   SUMMARY OF ONCOLOGIC HISTORY: Oncology History Overview Note   Cancer Staging  Pancreatic cancer [facility]) Staging form: Exocrine Pancreas, AJCC 8th Edition - Clinical stage from 08/05/2021: Stage IB (cT2, cN0, cM0) - Signed by Truitt Merle, MD on 08/17/2021 Stage prefix: Initial diagnosis Total positive nodes: 0     Pancreatic mass  07/18/2021 Initial Diagnosis   Pancreatic mass   07/18/2021 Imaging   CT Abdomen Pelvis W Contrast  IMPRESSION: 1. Suspected solid mass within the proximal body of the pancreas measures 2.3 x 2.6 x 2.0 cm. There is downstream dilation of the main pancreatic duct and its branches. There is also intra and extrahepatic biliary ductal dilation. Further evaluation with MRI of the abdomen, pancreatic protocol, when clinically feasible, may be considered. 2. 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 3. Aortic atherosclerosis   07/20/2021 Procedure   DG ERCP  IMPRESSION: Nondiagnostic ERCP as above. Correlation with the operative report is advised.  - The examination was suspicious for a biliary stricture in the bile duct. - Examination was suspicious for carcinoma  of the head of the pancreas. - Attempts at a cholangiogram failed.   07/22/2021 Pathology Results   CASE: [id number]   FINAL MICROSCOPIC DIAGNOSIS:  - Suspicious for malignancy  - See comment   DIAGNOSTIC COMMENTS:   There are rare cells with cytologic atypia suspicious for  adenocarcinoma.  Dr. Vic Ripper agrees.    07/22/2021 Procedure   IR INT EXT BILIARY DRAIN WITH CHOLANGIOGRAM ( IMPRESSION: 1. 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 2. Successful brush biopsy of obstructing lesion x3. 3. Successful placement of a 10 French internal/external biliary drainage catheter.   PLAN: 1. Follow bilirubin. When significant downward trend is clear, the bag should be capped. Recommend capping bag before discharge home if possible. 2. Follow-up in IR in 4-6 weeks for initial biliary tube check and exchange. If initial brush biopsies are negative, repeat biopsy could be considered at that time.   Pancreatic cancer ([place])  08/05/2021 Cancer Staging   Staging form: Exocrine Pancreas, AJCC 8th Edition - Clinical stage from 08/05/2021: Stage IB (cT2, cN0, cM0) - Signed by Truitt Merle, MD on 08/17/2021 Stage prefix: Initial diagnosis Total positive nodes: 0   08/14/2021 Initial Diagnosis   Pancreatic cancer ([place])   08/20/2021 - 03/23/2022 Chemotherapy   Patient is on Treatment Plan : PANCREATIC Abraxane / Gemcitabine D1,8 q21d     08/20/2021 -  Chemotherapy   Patient is on Treatment Plan : PANCREAS Gemcitabine D1,8 (1000) q21d x 8 Cycles     11/15/2021 Cancer Staging   Staging form: Exocrine Pancreas, AJCC 8th Edition - Pathologic stage from 11/15/2021: Stage IB (ypT2, pN0, cM0) - Signed by Truitt Merle, MD on 01/03/2022 Stage prefix: Post-therapy Total positive nodes: 0 Histologic grade (G): G3 Histologic grading system: 3 grade system Residual tumor (R): R0 - None   Pancreatic adenocarcinoma ([place])  11/15/2021 Initial Diagnosis   Pancreatic adenocarcinoma ([place])      INTERVAL HISTORY:  Angel Celeste Sr. is here for a follow up of pancreatic cancer. He was last seen by me on 04/07/22. He presents to the clinic accompanied by his daughter. They report  he continues to do well with treatment. He endorses eating very well. However, he has lost 10 lbs over the last 2 weeks. They note they cannot afford the creon and are still working to get financial assistance from [employer]. She also notes he had previously stopped drinking protein shakes. She notes he also had a cold recently but is doing better now.   All other systems were reviewed with the patient and are negative.  MEDICAL HISTORY:  Past Medical History:  Diagnosis Date   Aneurysm ([place])    2017   Anxiety    Arthritis    Depression    Family history of breast cancer    Family history of lung cancer    Headache    HLD (hyperlipidemia)    Hx of inguinal hernia repair    Hypertension    Hypothyroidism    Overactive bladder    Pancreatic cancer ([facility])    Pneumonia    Shingles    Sleep apnea    Patient denies   Thoracic ascending aortic aneurysm ([place])    mildly dilated 4.0cm per 08/12/21 CT   Thyroid disease    Varicose veins of both lower extremities     SURGICAL HISTORY: Past Surgical History:  Procedure Laterality Date   BILATERAL CARPAL TUNNEL RELEASE     CATARACT EXTRACTION, BILATERAL Bilateral  ELBOW SURGERY Bilateral    ENDOSCOPIC RETROGRADE CHOLANGIOPANCREATOGRAPHY (ERCP) WITH PROPOFOL N/A 07/20/2021   Procedure: ENDOSCOPIC RETROGRADE CHOLANGIOPANCREATOGRAPHY (ERCP) WITH PROPOFOL;  Surgeon: Gatha Mayer, MD;  [facility];  Service: Endoscopy;  Laterality: N/A;   ESOPHAGOGASTRODUODENOSCOPY (EGD) WITH PROPOFOL N/A 08/05/2021   Procedure: ESOPHAGOGASTRODUODENOSCOPY (EGD) WITH PROPOFOL;  Surgeon: Rush Landmark Telford Nab., MD;  Location: [place];  Service: Gastroenterology;  Laterality: N/A;   EUS N/A 08/05/2021   Procedure: UPPER ENDOSCOPIC ULTRASOUND (EUS) RADIAL;  Surgeon: Irving Copas., MD;  Location: [place];  Service: Gastroenterology;  Laterality: N/A;   EYE SURGERY     bilateral cataracts   FINE NEEDLE  ASPIRATION  08/05/2021   Procedure: FINE NEEDLE ASPIRATION (FNA) LINEAR;  Surgeon: Irving Copas., MD;  Location: [place];  Service: Gastroenterology;;   INGUINAL HERNIA REPAIR Right 1951   IR ANGIO INTRA EXTRACRAN SEL COM CAROTID INNOMINATE BILAT MOD SED  02/20/2019   IR BILIARY STENT(S) EXISTING ACCESS INC DILATION CATH EXCHANGE  09/20/2021   IR ENDOLUMINAL BX OF BILIARY TREE  07/22/2021   IR GENERIC HISTORICAL  10/28/2016   IR RADIOLOGIST EVAL & MGMT 10/28/2016 [facility]INTER[facility] RAD   IR INT EXT BILIARY DRAIN WITH CHOLANGIOGRAM  07/22/2021   JOINT REPLACEMENT     LAPAROSCOPY N/A 11/15/2021   Procedure: LAPAROSCOPIC DIAGNOSTIC STAGING;  Surgeon: Dwan Bolt, MD;  Location: [place];  Service: General;  Laterality: N/A;   PORTACATH PLACEMENT N/A 08/19/2021   Procedure: INSERTION PORT-A-CATH;  Surgeon: Dwan Bolt, MD;  Location: [facility];  Service: General;  Laterality: N/A;  LMA   RADIOLOGY WITH ANESTHESIA N/A 10/21/2015   Procedure: RADIOLOGY WITH ANESTHESIA;  Surgeon: Luanne Bras, MD;  Location: [place];  Service: Radiology;  Laterality: N/A;   RADIOLOGY WITH ANESTHESIA N/A 02/20/2019   Procedure: [employer];  Surgeon: Luanne Bras, MD;  Location: [place];  Service: Radiology;  Laterality: N/A;   ROTATOR CUFF REPAIR Bilateral    VARICOSE VEIN SURGERY     WHIPPLE PROCEDURE N/A 11/15/2021   Procedure: WHIPPLE PROCEDURE;  Surgeon: Dwan Bolt, MD;  Location: [place];  Service: General;  Laterality: N/A;    I have reviewed the social history and family history with the patient and they are unchanged from previous note.  ALLERGIES:  has No Known Allergies.  MEDICATIONS:  Current Outpatient Medications  Medication Sig Dispense Refill   acetaminophen (TYLENOL) 500 MG tablet Take 1 tablet (500 mg total) by mouth every 6 (six) hours as needed for mild pain. 30 tablet 0   apixaban (ELIQUIS) 5 MG TABS tablet Take 1 tablet (5 mg total) by mouth 2 (two) times daily. 60 tablet 2    Cholecalciferol (VITAMIN D3) 50 MCG (2000 UT) TABS Take 2,000 Units by mouth daily.     dexamethasone (DECADRON) 4 MG tablet TAKE 1 TABLET (4 MG TOTAL) BY MOUTH DAILY. FOR 3 TO 5 DAYS AFTER CHEMO FOR NAUSEA AND FATIGUE 15 tablet 0   diltiazem (CARDIZEM) 30 MG tablet Take 1 tablet (30 mg total) by mouth 3 (three) times daily. 90 tablet 2   docusate sodium (COLACE) 100 MG capsule Take 1 capsule (100 mg total) by mouth 2 (two) times daily. (Patient taking differently: Take 100 mg by mouth 2 (two) times daily as needed.) 28 capsule 0   escitalopram (LEXAPRO) 20 MG tablet Take 1 tablet (20 mg total) by mouth daily. 90 tablet 1   famotidine (PEPCID) 20 MG tablet Take 1 tablet (20 mg total) by mouth  2 (two) times daily. 60 tablet 1   feeding supplement (ENSURE ENLIVE / ENSURE PLUS) LIQD Take 237 mLs by mouth 3 (three) times daily between meals. 237 mL 12   ferrous sulfate 325 (65 FE) MG EC tablet Take 1 tablet (325 mg total) by mouth daily with breakfast. 30 tablet 3   levothyroxine (SYNTHROID) 112 MCG tablet TAKE 1 TABLET (112 MCG TOTAL) BY MOUTH DAILY BEFORE BREAKFAST. 90 tablet 1   lovastatin (MEVACOR) 20 MG tablet Take 1 tablet (20 mg total) by mouth at bedtime. 90 tablet 0   mirtazapine (REMERON) 7.5 MG tablet Take 1 tablet (7.5 mg total) by mouth at bedtime. 90 tablet 0   Multiple Vitamin (MULTIVITAMIN WITH MINERALS) TABS tablet Take 1 tablet by mouth daily. 30 tablet 0   nystatin-diphenhydrAMINE-alum & mag hydroxide-simeth Take 5 MLs by mouth 3 (three) times daily as needed for mouth pain. 120 mL 1   ondansetron (ZOFRAN) 4 MG tablet Take 1 tablet (4 mg total) by mouth every 8 (eight) hours as needed for nausea or vomiting. (Patient taking differently: Take 8 mg by mouth every 8 (eight) hours as needed for nausea or vomiting.) 20 tablet 0   pantoprazole (PROTONIX) 40 MG tablet Take 1 tablet by mouth daily. 90 tablet 0   polyethylene glycol (MIRALAX / GLYCOLAX) 17 g packet Take 17 g by mouth daily as  needed for moderate constipation. 14 each 0   vitamin B-12 (CYANOCOBALAMIN) 1000 MCG tablet Take 1,000 mcg by mouth 2 (two) times daily.     No current facility-administered medications for this visit.    PHYSICAL EXAMINATION: ECOG PERFORMANCE STATUS: 1 - Symptomatic but completely ambulatory  Vitals:   04/28/22 1305  BP: (!) 144/72  Pulse: 60  Resp: 18  Temp: 98.1 F (36.7 C)  SpO2: 99%   Wt Readings from Last 3 Encounters:  04/28/22 132 lb 9.6 oz (60.1 kg)  04/13/22 142 lb 8 oz (64.6 kg)  04/07/22 138 lb 12.8 oz (63 kg)     GENERAL:alert, no distress and comfortable SKIN: skin color normal, no rashes or significant lesions EYES: normal, Conjunctiva are pink and non-injected, sclera clear  NEURO: alert & oriented x 3 with fluent speech  LABORATORY DATA:  I have reviewed the data as listed    Latest Ref Rng & Units 04/28/2022   12:49 PM 04/13/2022   12:13 PM 04/07/2022   12:19 PM  CBC  WBC 4.0 - 10.5 K/uL 15.3  4.5  5.5   Hemoglobin 13.0 - 17.0 g/dL 9.1  8.4  8.5   Hematocrit 39.0 - 52.0 % 28.2  25.4  26.1   Platelets 150 - 400 K/uL 337  324  186         Latest Ref Rng & Units 04/13/2022   12:13 PM 04/07/2022   12:19 PM 03/23/2022   12:08 PM  CMP  Glucose 70 - 99 mg/dL 97  137  95   BUN 8 - 23 mg/dL '23  18  21   ' Creatinine 0.61 - 1.24 mg/dL 1.20  0.96  1.19   Sodium 135 - 145 mmol/L 137  137  139   Potassium 3.5 - 5.1 mmol/L 4.1  4.0  4.1   Chloride 98 - 111 mmol/L 102  104  105   CO2 22 - 32 mmol/L '31  28  30   ' Calcium 8.9 - 10.3 mg/dL 9.1  8.8  8.9   Total Protein 6.5 - 8.1 g/dL 6.3  6.5  6.7   Total Bilirubin 0.3 - 1.2 mg/dL 0.4  0.6  0.3   Alkaline Phos 38 - 126 U/L 80  89  114   AST 15 - 41 U/L '26  22  27   ' ALT 0 - 44 U/L '21  14  18       ' RADIOGRAPHIC STUDIES: I have personally reviewed the radiological images as listed and agreed with the findings in the report. No results found.    No orders of the defined types were placed in this  encounter.  All questions were answered. The patient knows to call the clinic with any problems, questions or concerns. No barriers to learning was detected. The total time spent in the appointment was 30 minutes.     Truitt Merle, MD 04/28/2022   I, Wilburn Mylar, am acting as scribe for Truitt Merle, MD.   I have reviewed the above documentation for accuracy and completeness, and I agree with the above.

## 2022-05-01 ENCOUNTER — Encounter: Payer: Self-pay | Admitting: Hematology

## 2022-05-03 ENCOUNTER — Ambulatory Visit (HOSPITAL_COMMUNITY)
Admission: RE | Admit: 2022-05-03 | Discharge: 2022-05-03 | Disposition: A | Discharge status: Home / Self Care | Payer: Medicare HMO | Source: Ambulatory Visit | Attending: Hematology | Admitting: Hematology

## 2022-05-03 DIAGNOSIS — I2510 Atherosclerotic heart disease of native coronary artery without angina pectoris: Secondary | ICD-10-CM | POA: Diagnosis not present

## 2022-05-03 DIAGNOSIS — C259 Malignant neoplasm of pancreas, unspecified: Secondary | ICD-10-CM | POA: Insufficient documentation

## 2022-05-03 DIAGNOSIS — I517 Cardiomegaly: Secondary | ICD-10-CM | POA: Diagnosis not present

## 2022-05-03 DIAGNOSIS — K8689 Other specified diseases of pancreas: Secondary | ICD-10-CM | POA: Diagnosis not present

## 2022-05-03 DIAGNOSIS — K6389 Other specified diseases of intestine: Secondary | ICD-10-CM | POA: Diagnosis not present

## 2022-05-03 DIAGNOSIS — I700 Atherosclerosis of aorta: Secondary | ICD-10-CM | POA: Diagnosis not present

## 2022-05-03 LAB — POCT I-STAT CREATININE: Creatinine, Ser: 1 mg/dL (ref 0.61–1.24)

## 2022-05-03 MED ORDER — IOHEXOL 300 MG/ML  SOLN
100.0000 mL | Freq: Once | INTRAMUSCULAR | Status: AC | PRN
  Administered 2022-05-03: 100 mL via INTRAVENOUS

## 2022-05-05 ENCOUNTER — Other Ambulatory Visit: Payer: Self-pay

## 2022-05-05 ENCOUNTER — Encounter: Payer: Self-pay | Admitting: Hematology

## 2022-05-05 ENCOUNTER — Inpatient Hospital Stay (HOSPITAL_BASED_OUTPATIENT_CLINIC_OR_DEPARTMENT_OTHER): Payer: Medicare HMO | Admitting: Hematology

## 2022-05-05 ENCOUNTER — Inpatient Hospital Stay: Payer: Medicare HMO

## 2022-05-05 ENCOUNTER — Other Ambulatory Visit (HOSPITAL_COMMUNITY): Payer: Self-pay

## 2022-05-05 ENCOUNTER — Inpatient Hospital Stay: Payer: Medicare HMO | Admitting: Hematology

## 2022-05-05 DIAGNOSIS — C251 Malignant neoplasm of body of pancreas: Secondary | ICD-10-CM

## 2022-05-05 DIAGNOSIS — Z5111 Encounter for antineoplastic chemotherapy: Secondary | ICD-10-CM | POA: Diagnosis not present

## 2022-05-05 DIAGNOSIS — C250 Malignant neoplasm of head of pancreas: Secondary | ICD-10-CM

## 2022-05-05 DIAGNOSIS — Z5189 Encounter for other specified aftercare: Secondary | ICD-10-CM | POA: Diagnosis not present

## 2022-05-05 LAB — CMP (CANCER CENTER ONLY)
ALT: 20 U/L (ref 0–44)
AST: 28 U/L (ref 15–41)
Albumin: 3.6 g/dL (ref 3.5–5.0)
Alkaline Phosphatase: 103 U/L (ref 38–126)
Anion gap: 5 (ref 5–15)
BUN: 12 mg/dL (ref 8–23)
CO2: 30 mmol/L (ref 22–32)
Calcium: 8.8 mg/dL — ABNORMAL LOW (ref 8.9–10.3)
Chloride: 102 mmol/L (ref 98–111)
Creatinine: 0.98 mg/dL (ref 0.61–1.24)
GFR, Estimated: >60 mL/min (ref >60)
Glucose, Bld: 87 mg/dL (ref 70–99)
Potassium: 3.8 mmol/L (ref 3.5–5.1)
Sodium: 137 mmol/L (ref 135–145)
Total Bilirubin: 0.5 mg/dL (ref 0.3–1.2)
Total Protein: 6.2 g/dL — ABNORMAL LOW (ref 6.5–8.1)

## 2022-05-05 LAB — CBC WITH DIFFERENTIAL (CANCER CENTER ONLY)
Abs Immature Granulocytes: 0.05 10*3/uL (ref 0.00–0.07)
Basophils Absolute: 0 10*3/uL (ref 0.0–0.1)
Basophils Relative: 0 %
Eosinophils Absolute: 0.1 10*3/uL (ref 0.0–0.5)
Eosinophils Relative: 2 %
HCT: 25.8 % — ABNORMAL LOW (ref 39.0–52.0)
Hemoglobin: 8.5 g/dL — ABNORMAL LOW (ref 13.0–17.0)
Immature Granulocytes: 1 %
Lymphocytes Relative: 17 %
Lymphs Abs: 1 10*3/uL (ref 0.7–4.0)
MCH: 29 pg (ref 26.0–34.0)
MCHC: 32.9 g/dL (ref 30.0–36.0)
MCV: 88.1 fL (ref 80.0–100.0)
Monocytes Absolute: 0.3 10*3/uL (ref 0.1–1.0)
Monocytes Relative: 5 %
Neutro Abs: 4.4 10*3/uL (ref 1.7–7.7)
Neutrophils Relative %: 75 %
Platelet Count: 278 10*3/uL (ref 150–400)
RBC: 2.93 MIL/uL — ABNORMAL LOW (ref 4.22–5.81)
RDW: 23.9 % — ABNORMAL HIGH (ref 11.5–15.5)
WBC Count: 5.8 10*3/uL (ref 4.0–10.5)
nRBC: 0 % (ref 0.0–0.2)

## 2022-05-05 MED ORDER — SODIUM CHLORIDE 0.9 % IV SOLN
800.0000 mg/m2 | Freq: Once | INTRAVENOUS | Status: AC
  Administered 2022-05-05: 1406 mg via INTRAVENOUS
  Filled 2022-05-05: qty 36.98

## 2022-05-05 MED ORDER — SODIUM CHLORIDE 0.9% FLUSH
10.0000 mL | INTRAVENOUS | Status: DC | PRN
  Administered 2022-05-05: 10 mL

## 2022-05-05 MED ORDER — NYSTATIN 100000 UNIT/ML MT SUSP
Freq: Three times a day (TID) | OROMUCOSAL | 1 refills | Status: DC | PRN
  Filled 2022-05-05: qty 120, 8d supply, fill #0

## 2022-05-05 MED ORDER — PROCHLORPERAZINE MALEATE 10 MG PO TABS
10.0000 mg | ORAL_TABLET | Freq: Once | ORAL | Status: AC
  Administered 2022-05-05: 10 mg via ORAL
  Filled 2022-05-05: qty 1

## 2022-05-05 MED ORDER — HEPARIN SOD (PORK) LOCK FLUSH 100 UNIT/ML IV SOLN
500.0000 [IU] | Freq: Once | INTRAVENOUS | Status: AC | PRN
  Administered 2022-05-05: 500 [IU]

## 2022-05-05 MED ORDER — SODIUM CHLORIDE 0.9 % IV SOLN: Freq: Once | INTRAVENOUS | Status: AC

## 2022-05-05 NOTE — Patient Instructions (Signed)
[facility]   Discharge Instructions: Thank you for choosing [facility] to provide your oncology and hematology care.   If you have a lab appointment with the Cancer Center, please go directly to the Cancer Center and check in at the registration area.   Wear comfortable clothing and clothing appropriate for easy access to any Portacath or PICC line.   We strive to give you quality time with your provider. You may need to reschedule your appointment if you arrive late (15 or more minutes).  Arriving late affects you and other patients whose appointments are after yours.  Also, if you miss three or more appointments without notifying the office, you may be dismissed from the clinic at the provider's discretion.      For prescription refill requests, have your pharmacy contact our office and allow 72 hours for refills to be completed.    Today you received the following chemotherapy and/or immunotherapy agents: gemcitabine      To help prevent nausea and vomiting after your treatment, we encourage you to take your nausea medication as directed.  BELOW ARE SYMPTOMS THAT SHOULD BE REPORTED IMMEDIATELY: *FEVER GREATER THAN 100.4 F (38 C) OR HIGHER *CHILLS OR SWEATING *NAUSEA AND VOMITING THAT IS NOT CONTROLLED WITH YOUR NAUSEA MEDICATION *UNUSUAL SHORTNESS OF BREATH *UNUSUAL BRUISING OR BLEEDING *URINARY PROBLEMS (pain or burning when urinating, or frequent urination) *BOWEL PROBLEMS (unusual diarrhea, constipation, pain near the anus) TENDERNESS IN MOUTH AND THROAT WITH OR WITHOUT PRESENCE OF ULCERS (sore throat, sores in mouth, or a toothache) UNUSUAL RASH, SWELLING OR PAIN  UNUSUAL VAGINAL DISCHARGE OR ITCHING   Items with * indicate a potential emergency and should be followed up as soon as possible or go to the Emergency Department if any problems should occur.  Please show the CHEMOTHERAPY ALERT CARD or IMMUNOTHERAPY ALERT CARD at check-in  to the Emergency Department and triage nurse.  Should you have questions after your visit or need to cancel or reschedule your appointment, please contact [facility]  Dept: [phone]  and follow the prompts.  Office hours are 8:00 a.m. to 4:30 p.m. Monday - Friday. Please note that voicemails left after 4:00 p.m. may not be returned until the following business day.  We are closed weekends and major holidays. You have access to a nurse at all times for urgent questions. Please call the main number to the clinic Dept: [phone] and follow the prompts.   For any non-urgent questions, you may also contact your provider using MyChart. We now offer e-Visits for anyone 18 and older to request care online for non-urgent symptoms. For details visit [email or web address].   Also download the MyChart app! Go to the app store, search "MyChart", open the app, select [place], and log in with your MyChart username and password.  Masks are optional in the cancer centers. If you would like for your care team to wear a mask while they are taking care of you, please let them know. You may have one support person who is at least 82 years old accompany you for your appointments. 

## 2022-05-05 NOTE — Progress Notes (Signed)
[place]   [phone]) [phone] [phone]) [phone]   Clinic Follow up Note   Patient Care Team: Ronnell Freshwater, NP as PCP - General (Family Medicine) Buford Dresser, MD as PCP - Cardiology (Cardiology) Truitt Merle, MD as Consulting Physician (Oncology) Royston Bake, RN as Oncology Nurse Navigator (Oncology)  Date of Service:  05/05/2022  CHIEF COMPLAINT: review recent scan results, f/u of pancreatic cancer  CURRENT THERAPY:  Gemcitabine, days 1 and 8 q21d, starting 01/20/22  ASSESSMENT & PLAN:  Angel Keller. is a 81 y.o. male with   1. 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 EUS and pancreatic mass biopsy on 08/05/21 confirmed adenocarcinoma. -Pancreatic protocol CT scan showed 3.6 x 2.6 cm pancreatic head mass, partially involving multiple vessels, including SMV, portal vein, and hepatic artery.  -he began neoadjuvant gemcitabine alone on 08/20/21. Abraxane was added with cycle 2 (09/17/21), but he tolerated poorly and discontinued after first dose. -s/p whipple procedure on 11/15/21 with Dr. Zenia Resides. 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 Margins and lymph nodes negative. -he restarted adjuvant gemcitabine on 01/20/22, plan for 5 months. Aside from recent weight loss, he tolerates well, no sickness or fatigue. Will monitor. -restaging CT CAP on 05/03/22 showed NED. I reviewed the results with them today. -labs reviewed, stable on treatment, hgb 8.5. Adequate to continue.   2. Diarrhea, abdominal cramping and weight loss  -secondary to pancreatic surgery -he started creon 01/04/22, not currently taking due to cost. They are working with manufacturer for assistance.   3. HTN -he is holding his BP medicines during chemotherapy.   4. Microcytic anemia -Iron panel 01/20/22 confirmed iron deficiency anemia--  ferritin 9, iron serum 25.  -prescribed ferrous sulfate 325 mg that day -hgb stable at 8.5 today (05/05/22)     PLAN -proceed with S9G2 gem today -lab, flush, and gemcitabine in 2 and 3 weeks with injection on day 9 or 10 -f/u in 3 weeks    No problem-specific Assessment & Plan notes found for this encounter.   SUMMARY OF ONCOLOGIC HISTORY: Oncology History Overview Note   Cancer Staging  Pancreatic cancer [facility]) Staging form: Exocrine Pancreas, AJCC 8th Edition - Clinical stage from 08/05/2021: Stage IB (cT2, cN0, cM0) - Signed by Truitt Merle, MD on 08/17/2021 Stage prefix: Initial diagnosis Total positive nodes: 0     Pancreatic mass  07/18/2021 Initial Diagnosis   Pancreatic mass   07/18/2021 Imaging   CT Abdomen Pelvis W Contrast  IMPRESSION: 1. Suspected solid mass within the proximal body of the pancreas measures 2.3 x 2.6 x 2.0 cm. There is downstream dilation of the main pancreatic duct and its branches. There is also intra and extrahepatic biliary ductal dilation. Further evaluation with MRI of the abdomen, pancreatic protocol, when clinically feasible, may be considered. 2. 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 3. Aortic atherosclerosis   07/20/2021 Procedure   DG ERCP  IMPRESSION: Nondiagnostic ERCP as above. Correlation with the operative report is advised.  - The examination was suspicious for a biliary stricture in the bile duct. - Examination was suspicious for carcinoma of the head of the pancreas. - Attempts at a cholangiogram failed.   07/22/2021 Pathology Results   CASE: [id number]   FINAL MICROSCOPIC DIAGNOSIS:  - Suspicious for malignancy  - See comment   DIAGNOSTIC COMMENTS:  There are rare cells with cytologic atypia  suspicious for  adenocarcinoma.  Dr. Vic Ripper agrees.    07/22/2021 Procedure   IR INT EXT BILIARY DRAIN WITH CHOLANGIOGRAM ( IMPRESSION: 1. Percutaneous transhepatic cholangiogram demonstrates  complete occlusion of the distal common bile duct. 2. Successful brush biopsy of obstructing lesion x3. 3. Successful placement of a 10 French internal/external biliary drainage catheter.   PLAN: 1. Follow bilirubin. When significant downward trend is clear, the bag should be capped. Recommend capping bag before discharge home if possible. 2. Follow-up in IR in 4-6 weeks for initial biliary tube check and exchange. If initial brush biopsies are negative, repeat biopsy could be considered at that time.   Pancreatic cancer ([place])  08/05/2021 Cancer Staging   Staging form: Exocrine Pancreas, AJCC 8th Edition - Clinical stage from 08/05/2021: Stage IB (cT2, cN0, cM0) - Signed by Truitt Merle, MD on 08/17/2021 Stage prefix: Initial diagnosis Total positive nodes: 0   08/14/2021 Initial Diagnosis   Pancreatic cancer ([place])   08/20/2021 - 03/23/2022 Chemotherapy   Patient is on Treatment Plan : PANCREATIC Abraxane / Gemcitabine D1,8 q21d     08/20/2021 -  Chemotherapy   Patient is on Treatment Plan : PANCREAS Gemcitabine D1,8 (1000) q21d x 8 Cycles     11/15/2021 Cancer Staging   Staging form: Exocrine Pancreas, AJCC 8th Edition - Pathologic stage from 11/15/2021: Stage IB (ypT2, pN0, cM0) - Signed by Truitt Merle, MD on 01/03/2022 Stage prefix: Post-therapy Total positive nodes: 0 Histologic grade (G): G3 Histologic grading system: 3 grade system Residual tumor (R): R0 - None   Pancreatic adenocarcinoma ([place])  11/15/2021 Initial Diagnosis   Pancreatic adenocarcinoma ([place])      INTERVAL HISTORY:  Angel Celeste Sr. is here for a follow up of pancreatic cancer. He was last seen by me on 04/28/22. He presents to the clinic accompanied by his daughter. He reports he is doing well overall, stable with no new concerns.   All other systems were reviewed with the patient and are negative.  MEDICAL HISTORY:  Past Medical History:  Diagnosis Date   Aneurysm ([employer])    2017   Anxiety    Arthritis     Depression    Family history of breast cancer    Family history of lung cancer    Headache    HLD (hyperlipidemia)    Hx of inguinal hernia repair    Hypertension    Hypothyroidism    Overactive bladder    Pancreatic cancer ([facility])    Pneumonia    Shingles    Sleep apnea    Patient denies   Thoracic ascending aortic aneurysm ([place])    mildly dilated 4.0cm per 08/12/21 CT   Thyroid disease    Varicose veins of both lower extremities     SURGICAL HISTORY: Past Surgical History:  Procedure Laterality Date   BILATERAL CARPAL TUNNEL RELEASE     CATARACT EXTRACTION, BILATERAL Bilateral    ELBOW SURGERY Bilateral    ENDOSCOPIC RETROGRADE CHOLANGIOPANCREATOGRAPHY (ERCP) WITH PROPOFOL N/A 07/20/2021   Procedure: ENDOSCOPIC RETROGRADE CHOLANGIOPANCREATOGRAPHY (ERCP) WITH PROPOFOL;  Surgeon: Gatha Mayer, MD;  Location: [facility] ENDOSCOPY;  Service: Endoscopy;  Laterality: N/A;   ESOPHAGOGASTRODUODENOSCOPY (EGD) WITH PROPOFOL N/A 08/05/2021   Procedure: ESOPHAGOGASTRODUODENOSCOPY (EGD) WITH PROPOFOL;  Surgeon: Rush Landmark Telford Nab., MD;  Location: [place];  Service: Gastroenterology;  Laterality: N/A;   EUS N/A 08/05/2021   Procedure: UPPER ENDOSCOPIC ULTRASOUND (EUS) RADIAL;  Surgeon: Irving Copas., MD;  Location: [place];  Service: Gastroenterology;  Laterality: N/A;  EYE SURGERY     bilateral cataracts   FINE NEEDLE ASPIRATION  08/05/2021   Procedure: FINE NEEDLE ASPIRATION (FNA) LINEAR;  Surgeon: Irving Copas., MD;  Location: [place];  Service: Gastroenterology;;   INGUINAL HERNIA REPAIR Right 1951   IR ANGIO INTRA EXTRACRAN SEL COM CAROTID INNOMINATE BILAT MOD SED  02/20/2019   IR BILIARY STENT(S) EXISTING ACCESS INC DILATION CATH EXCHANGE  09/20/2021   IR ENDOLUMINAL BX OF BILIARY TREE  07/22/2021   IR GENERIC HISTORICAL  10/28/2016   IR RADIOLOGIST EVAL & MGMT 10/28/2016 [facility]   IR INT EXT BILIARY DRAIN WITH CHOLANGIOGRAM  07/22/2021    JOINT REPLACEMENT     LAPAROSCOPY N/A 11/15/2021   Procedure: LAPAROSCOPIC DIAGNOSTIC STAGING;  Surgeon: Dwan Bolt, MD;  Location: [place];  Service: General;  Laterality: N/A;   PORTACATH PLACEMENT N/A 08/19/2021   Procedure: INSERTION PORT-A-CATH;  Surgeon: Dwan Bolt, MD;  Location: [facility];  Service: General;  Laterality: N/A;  LMA   RADIOLOGY WITH ANESTHESIA N/A 10/21/2015   Procedure: RADIOLOGY WITH ANESTHESIA;  Surgeon: Luanne Bras, MD;  Location: [place];  Service: Radiology;  Laterality: N/A;   RADIOLOGY WITH ANESTHESIA N/A 02/20/2019   Procedure: [employer];  Surgeon: Luanne Bras, MD;  Location: [place];  Service: Radiology;  Laterality: N/A;   ROTATOR CUFF REPAIR Bilateral    VARICOSE VEIN SURGERY     WHIPPLE PROCEDURE N/A 11/15/2021   Procedure: WHIPPLE PROCEDURE;  Surgeon: Dwan Bolt, MD;  Location: [place];  Service: General;  Laterality: N/A;    I have reviewed the social history and family history with the patient and they are unchanged from previous note.  ALLERGIES:  has No Known Allergies.  MEDICATIONS:  Current Outpatient Medications  Medication Sig Dispense Refill   acetaminophen (TYLENOL) 500 MG tablet Take 1 tablet (500 mg total) by mouth every 6 (six) hours as needed for mild pain. 30 tablet 0   apixaban (ELIQUIS) 5 MG TABS tablet Take 1 tablet (5 mg total) by mouth 2 (two) times daily. 60 tablet 2   Cholecalciferol (VITAMIN D3) 50 MCG (2000 UT) TABS Take 2,000 Units by mouth daily.     dexamethasone (DECADRON) 4 MG tablet TAKE 1 TABLET (4 MG TOTAL) BY MOUTH DAILY. FOR 3 TO 5 DAYS AFTER CHEMO FOR NAUSEA AND FATIGUE 15 tablet 0   diltiazem (CARDIZEM) 30 MG tablet Take 1 tablet (30 mg total) by mouth 3 (three) times daily. 90 tablet 2   docusate sodium (COLACE) 100 MG capsule Take 1 capsule (100 mg total) by mouth 2 (two) times daily. (Patient taking differently: Take 100 mg by mouth 2 (two) times daily as needed.) 28 capsule 0   escitalopram  (LEXAPRO) 20 MG tablet Take 1 tablet (20 mg total) by mouth daily. 90 tablet 1   famotidine (PEPCID) 20 MG tablet Take 1 tablet (20 mg total) by mouth 2 (two) times daily. 60 tablet 1   feeding supplement (ENSURE ENLIVE / ENSURE PLUS) LIQD Take 237 mLs by mouth 3 (three) times daily between meals. 237 mL 12   ferrous sulfate 325 (65 FE) MG EC tablet Take 1 tablet (325 mg total) by mouth daily with breakfast. 30 tablet 3   levothyroxine (SYNTHROID) 112 MCG tablet TAKE 1 TABLET (112 MCG TOTAL) BY MOUTH DAILY BEFORE BREAKFAST. 90 tablet 1   lovastatin (MEVACOR) 20 MG tablet Take 1 tablet (20 mg total) by mouth at bedtime. 90 tablet 0   mirtazapine (REMERON) 7.5  MG tablet Take 1 tablet (7.5 mg total) by mouth at bedtime. 90 tablet 0   Multiple Vitamin (MULTIVITAMIN WITH MINERALS) TABS tablet Take 1 tablet by mouth daily. 30 tablet 0   nystatin-diphenhydrAMINE-alum & mag hydroxide-simeth Take 5 MLs by mouth 3 (three) times daily as needed for mouth pain. 120 mL 1   ondansetron (ZOFRAN) 4 MG tablet Take 1 tablet (4 mg total) by mouth every 8 (eight) hours as needed for nausea or vomiting. (Patient taking differently: Take 8 mg by mouth every 8 (eight) hours as needed for nausea or vomiting.) 20 tablet 0   pantoprazole (PROTONIX) 40 MG tablet Take 1 tablet by mouth daily. 90 tablet 0   polyethylene glycol (MIRALAX / GLYCOLAX) 17 g packet Take 17 g by mouth daily as needed for moderate constipation. 14 each 0   vitamin B-12 (CYANOCOBALAMIN) 1000 MCG tablet Take 1,000 mcg by mouth 2 (two) times daily.     No current facility-administered medications for this visit.   Facility-Administered Medications Ordered in Other Visits  Medication Dose Route Frequency Provider Last Rate Last Admin   sodium chloride flush (NS) 0.9 % injection 10 mL  10 mL Intracatheter PRN Truitt Merle, MD   10 mL at 05/05/22 1246    PHYSICAL EXAMINATION: ECOG PERFORMANCE STATUS: 1 - Symptomatic but completely ambulatory  Vitals:    05/05/22 1052  BP: (!) 178/92  Pulse: 72  Resp: 19  Temp: 98 F (36.7 C)  SpO2: 99%   Wt Readings from Last 3 Encounters:  05/05/22 136 lb 3.2 oz (61.8 kg)  04/28/22 132 lb 9.6 oz (60.1 kg)  04/13/22 142 lb 8 oz (64.6 kg)     GENERAL:alert, no distress and comfortable SKIN: skin color normal, no rashes or significant lesions EYES: normal, Conjunctiva are pink and non-injected, sclera clear  NEURO: alert & oriented x 3 with fluent speech  LABORATORY DATA:  I have reviewed the data as listed    Latest Ref Rng & Units 05/05/2022   10:33 AM 04/28/2022   12:49 PM 04/13/2022   12:13 PM  CBC  WBC 4.0 - 10.5 K/uL 5.8  15.3  4.5   Hemoglobin 13.0 - 17.0 g/dL 8.5  9.1  8.4   Hematocrit 39.0 - 52.0 % 25.8  28.2  25.4   Platelets 150 - 400 K/uL 278  337  324         Latest Ref Rng & Units 05/05/2022   10:33 AM 05/03/2022    4:59 PM 04/28/2022   12:49 PM  CMP  Glucose 70 - 99 mg/dL 87   120   BUN 8 - 23 mg/dL 12   18   Creatinine 0.61 - 1.24 mg/dL 0.98  1.00  1.18   Sodium 135 - 145 mmol/L 137   135   Potassium 3.5 - 5.1 mmol/L 3.8   3.8   Chloride 98 - 111 mmol/L 102   100   CO2 22 - 32 mmol/L 30   29   Calcium 8.9 - 10.3 mg/dL 8.8   9.1   Total Protein 6.5 - 8.1 g/dL 6.2   6.3   Total Bilirubin 0.3 - 1.2 mg/dL 0.5   0.6   Alkaline Phos 38 - 126 U/L 103   134   AST 15 - 41 U/L 28   23   ALT 0 - 44 U/L 20   13       RADIOGRAPHIC STUDIES: I have personally reviewed the radiological images as listed  and agreed with the findings in the report. CT CHEST ABDOMEN PELVIS W CONTRAST  Result Date: 05/04/2022 CLINICAL DATA:  Follow-up of pancreatic cancer. Chemotherapy most recent 04/28/2022. Status post Whipple procedure 11/15/2021. * Tracking Code: BO * EXAM: CT CHEST, ABDOMEN, AND PELVIS WITH CONTRAST TECHNIQUE: Multidetector CT imaging of the chest, abdomen and pelvis was performed following the standard protocol during bolus administration of intravenous contrast. RADIATION DOSE  REDUCTION: This exam was performed according to the departmental dose-optimization program which includes automated exposure control, adjustment of the mA and/or kV according to patient size and/or use of iterative reconstruction technique. CONTRAST:  133m OMNIPAQUE IOHEXOL 300 MG/ML  SOLN COMPARISON:  10/20/2021 abdominopelvic CT. Most recent CTA chest of 08/28/2021 FINDINGS: CT CHEST FINDINGS Cardiovascular: Right Port-A-Cath tip low SVC. Aortic atherosclerosis. Tortuous thoracic aorta. Mild cardiomegaly, without pericardial effusion. Three vessel coronary artery calcification. No central pulmonary embolism, on this non-dedicated study. Mediastinum/Nodes: No supraclavicular adenopathy. No mediastinal or hilar adenopathy. Subtle esophageal fluid level on 28/2. Lungs/Pleura: No pleural fluid. Posterior right upper lobe scarring or subsegmental atelectasis. Musculoskeletal: No acute osseous abnormality. CT ABDOMEN PELVIS FINDINGS Hepatobiliary: Pneumobilia. No suspicious liver lesion. Choledochojejunostomy. No biliary duct dilatation. Pancreas: Interval Whipple procedure. Pancreatic body/ tail atrophy and borderline duct dilatation are similar and slightly improved respectively. No residual or recurrent disease at the pancreatic to jejunal anastomosis. Spleen: Normal in size, without focal abnormality. Adrenals/Urinary Tract: Normal adrenal glands. Normal kidneys, without hydronephrosis. Median lobe prostate impression into the bladder. Stomach/Bowel: Gastrojejunal anastomosis. Colonic stool burden suggests constipation. Normal terminal ileum. Normal small bowel. Anterior abdominal wall laxity contains nonobstructive transverse colon. Vascular/Lymphatic: Aortic atherosclerosis. Patent portal and splenic veins. No abdominopelvic adenopathy. Reproductive: Mild prostatomegaly. Other: No significant free fluid. No free intraperitoneal air. No evidence of omental or peritoneal disease. Musculoskeletal: Advanced  thoracolumbar spondylosis. IMPRESSION: 1. Interval Whipple procedure, without findings of residual/recurrent or metastatic disease. 2. Esophageal air fluid level suggests dysmotility or gastroesophageal reflux. 3. Coronary artery atherosclerosis. Aortic Atherosclerosis (ICD10-I70.0). 4.  Possible constipation. Electronically Signed   By: KAbigail MiyamotoM.D.   On: 05/04/2022 15:48      No orders of the defined types were placed in this encounter.  All questions were answered. The patient knows to call the clinic with any problems, questions or concerns. No barriers to learning was detected. The total time spent in the appointment was 30 minutes.     YTruitt Merle MD 05/05/2022   I, KWilburn Mylar am acting as scribe for YTruitt Merle MD.   I have reviewed the above documentation for accuracy and completeness, and I agree with the above.

## 2022-05-07 ENCOUNTER — Inpatient Hospital Stay: Payer: Medicare HMO

## 2022-05-07 VITALS — BP 148/80 | HR 76 | Temp 97.9°F | Resp 18 | Ht 70.8 in

## 2022-05-07 DIAGNOSIS — C251 Malignant neoplasm of body of pancreas: Secondary | ICD-10-CM

## 2022-05-07 DIAGNOSIS — C250 Malignant neoplasm of head of pancreas: Secondary | ICD-10-CM | POA: Diagnosis not present

## 2022-05-07 DIAGNOSIS — Z5111 Encounter for antineoplastic chemotherapy: Secondary | ICD-10-CM | POA: Diagnosis not present

## 2022-05-07 DIAGNOSIS — Z5189 Encounter for other specified aftercare: Secondary | ICD-10-CM | POA: Diagnosis not present

## 2022-05-07 LAB — CANCER ANTIGEN 19-9: CA 19-9: 19 U/mL (ref 0–35)

## 2022-05-07 MED ORDER — PEGFILGRASTIM-CBQV 6 MG/0.6ML ~~LOC~~ SOSY
6.0000 mg | PREFILLED_SYRINGE | Freq: Once | SUBCUTANEOUS | Status: AC
  Administered 2022-05-07: 6 mg via SUBCUTANEOUS

## 2022-05-17 ENCOUNTER — Other Ambulatory Visit: Payer: Self-pay | Admitting: Hematology

## 2022-05-17 ENCOUNTER — Other Ambulatory Visit: Payer: Self-pay | Admitting: Nurse Practitioner

## 2022-05-17 DIAGNOSIS — E782 Mixed hyperlipidemia: Secondary | ICD-10-CM

## 2022-05-17 DIAGNOSIS — F439 Reaction to severe stress, unspecified: Secondary | ICD-10-CM

## 2022-05-18 MED ORDER — PANTOPRAZOLE SODIUM 40 MG PO TBEC: 40.0000 mg | DELAYED_RELEASE_TABLET | Freq: Every day | ORAL | 0 refills | Status: DC

## 2022-05-18 MED ORDER — MIRTAZAPINE 7.5 MG PO TABS: 7.5000 mg | ORAL_TABLET | Freq: Every day | ORAL | 0 refills | Status: DC

## 2022-05-18 MED ORDER — LOVASTATIN 20 MG PO TABS: 20.0000 mg | ORAL_TABLET | Freq: Every day | ORAL | 0 refills | Status: DC

## 2022-05-18 MED ORDER — ONDANSETRON HCL 4 MG PO TABS: 4.0000 mg | ORAL_TABLET | Freq: Three times a day (TID) | ORAL | 0 refills | Status: DC | PRN

## 2022-05-18 MED ORDER — DEXAMETHASONE 4 MG PO TABS: 4.0000 mg | ORAL_TABLET | Freq: Every day | ORAL | 0 refills | Status: DC

## 2022-05-20 ENCOUNTER — Inpatient Hospital Stay: Payer: Medicare HMO

## 2022-05-20 ENCOUNTER — Inpatient Hospital Stay: Payer: Medicare HMO | Attending: Physician Assistant

## 2022-05-20 ENCOUNTER — Encounter: Payer: Self-pay | Admitting: Hematology

## 2022-05-20 ENCOUNTER — Inpatient Hospital Stay (HOSPITAL_BASED_OUTPATIENT_CLINIC_OR_DEPARTMENT_OTHER): Payer: Medicare HMO | Admitting: Hematology

## 2022-05-20 ENCOUNTER — Inpatient Hospital Stay: Payer: Medicare HMO | Admitting: Dietician

## 2022-05-20 VITALS — BP 121/77 | HR 78 | Temp 97.7°F | Resp 18 | Ht 70.8 in | Wt 128.9 lb

## 2022-05-20 DIAGNOSIS — D509 Iron deficiency anemia, unspecified: Secondary | ICD-10-CM | POA: Insufficient documentation

## 2022-05-20 DIAGNOSIS — Z5111 Encounter for antineoplastic chemotherapy: Secondary | ICD-10-CM | POA: Insufficient documentation

## 2022-05-20 DIAGNOSIS — C251 Malignant neoplasm of body of pancreas: Secondary | ICD-10-CM

## 2022-05-20 DIAGNOSIS — Z95828 Presence of other vascular implants and grafts: Secondary | ICD-10-CM

## 2022-05-20 DIAGNOSIS — R110 Nausea: Secondary | ICD-10-CM | POA: Diagnosis not present

## 2022-05-20 DIAGNOSIS — C250 Malignant neoplasm of head of pancreas: Secondary | ICD-10-CM | POA: Insufficient documentation

## 2022-05-20 LAB — CBC WITH DIFFERENTIAL (CANCER CENTER ONLY)
Abs Immature Granulocytes: 0.58 10*3/uL — ABNORMAL HIGH (ref 0.00–0.07)
Basophils Absolute: 0.1 10*3/uL (ref 0.0–0.1)
Basophils Relative: 1 %
Eosinophils Absolute: 0.3 10*3/uL (ref 0.0–0.5)
Eosinophils Relative: 1 %
HCT: 29 % — ABNORMAL LOW (ref 39.0–52.0)
Hemoglobin: 9.4 g/dL — ABNORMAL LOW (ref 13.0–17.0)
Immature Granulocytes: 3 %
Lymphocytes Relative: 10 %
Lymphs Abs: 2 10*3/uL (ref 0.7–4.0)
MCH: 29.6 pg (ref 26.0–34.0)
MCHC: 32.4 g/dL (ref 30.0–36.0)
MCV: 91.2 fL (ref 80.0–100.0)
Monocytes Absolute: 1.9 10*3/uL — ABNORMAL HIGH (ref 0.1–1.0)
Monocytes Relative: 9 %
Neutro Abs: 15.6 10*3/uL — ABNORMAL HIGH (ref 1.7–7.7)
Neutrophils Relative %: 76 %
Platelet Count: 535 10*3/uL — ABNORMAL HIGH (ref 150–400)
RBC: 3.18 MIL/uL — ABNORMAL LOW (ref 4.22–5.81)
RDW: 24.2 % — ABNORMAL HIGH (ref 11.5–15.5)
WBC Count: 20.5 10*3/uL — ABNORMAL HIGH (ref 4.0–10.5)
nRBC: 0 % (ref 0.0–0.2)

## 2022-05-20 LAB — CMP (CANCER CENTER ONLY)
ALT: 11 U/L (ref 0–44)
AST: 25 U/L (ref 15–41)
Albumin: 3.5 g/dL (ref 3.5–5.0)
Alkaline Phosphatase: 157 U/L — ABNORMAL HIGH (ref 38–126)
Anion gap: 5 (ref 5–15)
BUN: 19 mg/dL (ref 8–23)
CO2: 29 mmol/L (ref 22–32)
Calcium: 8.6 mg/dL — ABNORMAL LOW (ref 8.9–10.3)
Chloride: 102 mmol/L (ref 98–111)
Creatinine: 1.52 mg/dL — ABNORMAL HIGH (ref 0.61–1.24)
GFR, Estimated: 45 mL/min — ABNORMAL LOW (ref >60)
Glucose, Bld: 102 mg/dL — ABNORMAL HIGH (ref 70–99)
Potassium: 4.3 mmol/L (ref 3.5–5.1)
Sodium: 136 mmol/L (ref 135–145)
Total Bilirubin: 0.6 mg/dL (ref 0.3–1.2)
Total Protein: 6.5 g/dL (ref 6.5–8.1)

## 2022-05-20 MED ORDER — PROCHLORPERAZINE MALEATE 10 MG PO TABS
10.0000 mg | ORAL_TABLET | Freq: Once | ORAL | Status: AC
  Administered 2022-05-20: 10 mg via ORAL
  Filled 2022-05-20: qty 1

## 2022-05-20 MED ORDER — SODIUM CHLORIDE 0.9% FLUSH
10.0000 mL | INTRAVENOUS | Status: DC | PRN
  Administered 2022-05-20: 10 mL

## 2022-05-20 MED ORDER — SODIUM CHLORIDE 0.9 % IV SOLN: Freq: Once | INTRAVENOUS | Status: AC

## 2022-05-20 MED ORDER — HEPARIN SOD (PORK) LOCK FLUSH 100 UNIT/ML IV SOLN
500.0000 [IU] | Freq: Once | INTRAVENOUS | Status: AC | PRN
  Administered 2022-05-20: 500 [IU]

## 2022-05-20 MED ORDER — PALONOSETRON HCL INJECTION 0.25 MG/5ML
0.2500 mg | Freq: Once | INTRAVENOUS | Status: AC
  Administered 2022-05-20: 0.25 mg via INTRAVENOUS
  Filled 2022-05-20: qty 5

## 2022-05-20 MED ORDER — PANCRELIPASE (LIP-PROT-AMYL) 36000-114000 UNITS PO CPEP: 36000.0000 [IU] | ORAL_CAPSULE | Freq: Three times a day (TID) | ORAL | 0 refills | Status: DC

## 2022-05-20 MED ORDER — SODIUM CHLORIDE 0.9 % IV SOLN
800.0000 mg/m2 | Freq: Once | INTRAVENOUS | Status: AC
  Administered 2022-05-20: 1406 mg via INTRAVENOUS
  Filled 2022-05-20: qty 36.98

## 2022-05-20 MED ORDER — DEXAMETHASONE 4 MG PO TABS
4.0000 mg | ORAL_TABLET | Freq: Once | ORAL | Status: AC
  Administered 2022-05-20: 4 mg via ORAL
  Filled 2022-05-20: qty 1

## 2022-05-20 NOTE — Progress Notes (Signed)
[place]   Telephone:(336) [phone] Fax:([phone]) [phone]   Clinic Follow up Note   Patient Care Team: Ronnell Freshwater, NP as PCP - General (Family Medicine) Buford Dresser, MD as PCP - Cardiology (Cardiology) Truitt Merle, MD as Consulting Physician (Oncology) Royston Bake, RN as Oncology Nurse Navigator (Oncology)  Date of Service:  05/20/2022  CHIEF COMPLAINT: f/u of pancreatic cancer  CURRENT THERAPY:  Gemcitabine, days 1 and 8 q21d, starting 01/20/22  ASSESSMENT & PLAN:  Angel Keller. is a 81 y.o. male with   1. 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 EUS and pancreatic mass biopsy on 08/05/21 confirmed adenocarcinoma. -Pancreatic protocol CT scan showed 3.6 x 2.6 cm pancreatic head mass, partially involving multiple vessels, including SMV, portal vein, and hepatic artery.  -he began neoadjuvant gemcitabine alone on 08/20/21. Abraxane was added with cycle 2 (09/17/21), but he tolerated poorly and discontinued after first dose. -s/p whipple procedure on 11/15/21 with Dr. Zenia Resides. 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 Margins and lymph nodes negative. -he restarted adjuvant gemcitabine on 01/20/22, plan for 5 months. He tolerates well, no sickness or fatigue. -restaging CT CAP on 05/03/22 showed NED. -his daughter reports he is experiencing nausea and low appetite, worse off creon.  -labs reviewed, CMP affected by low appetite-- creatinine 1.52, calcium 8.6, alk phos 157. CBC shows hgb 9.4, plt 535k. We discussed whether to proceed with treatment today given his nausea. He would like to proceed.   2. Diarrhea, abdominal cramping and weight loss  -secondary to pancreatic surgery -he started creon 01/04/22, not currently taking due to cost. They are working with manufacturer for assistance.   3. HTN -he is  holding his BP medicines during chemotherapy.   4. Microcytic anemia -Iron panel 01/20/22 confirmed iron deficiency anemia-- ferritin 9, iron serum 25.  -prescribed ferrous sulfate 325 mg that day -hgb stable at 9.4 today (05/20/22)     PLAN -proceed with gemcitabine today, will add aloxi and dexa for his nausea  -lab, flush, and gemcitabine in 1, 3 and 4 weeks -f/u in 3 weeks    No problem-specific Assessment & Plan notes found for this encounter.   SUMMARY OF ONCOLOGIC HISTORY: Oncology History Overview Note   Cancer Staging  Pancreatic cancer [facility] Staging form: Exocrine Pancreas, AJCC 8th Edition - Clinical stage from 08/05/2021: Stage IB (cT2, cN0, cM0) - Signed by Truitt Merle, MD on 08/17/2021 Stage prefix: Initial diagnosis Total positive nodes: 0     Pancreatic mass  07/18/2021 Initial Diagnosis   Pancreatic mass   07/18/2021 Imaging   CT Abdomen Pelvis W Contrast  IMPRESSION: 1. Suspected solid mass within the proximal body of the pancreas measures 2.3 x 2.6 x 2.0 cm. There is downstream dilation of the main pancreatic duct and its branches. There is also intra and extrahepatic biliary ductal dilation. Further evaluation with MRI of the abdomen, pancreatic protocol, when clinically feasible, may be considered. 2. 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 3. Aortic atherosclerosis   07/20/2021 Procedure   DG ERCP  IMPRESSION: Nondiagnostic ERCP as above. Correlation with the operative report is advised.  - The examination was suspicious for a biliary stricture in the bile duct. - Examination was suspicious for carcinoma of the head of the pancreas. - Attempts at a cholangiogram failed.   07/22/2021 Pathology Results   CASE: [id number]  FINAL MICROSCOPIC DIAGNOSIS:  - Suspicious for malignancy  - See comment   DIAGNOSTIC COMMENTS:  There are rare cells with cytologic atypia suspicious for  adenocarcinoma.  Dr. Vic Ripper agrees.     07/22/2021 Procedure   IR INT EXT BILIARY DRAIN WITH CHOLANGIOGRAM ( IMPRESSION: 1. 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 2. Successful brush biopsy of obstructing lesion x3. 3. Successful placement of a 10 French internal/external biliary drainage catheter.   PLAN: 1. Follow bilirubin. When significant downward trend is clear, the bag should be capped. Recommend capping bag before discharge home if possible. 2. Follow-up in IR in 4-6 weeks for initial biliary tube check and exchange. If initial brush biopsies are negative, repeat biopsy could be considered at that time.   Pancreatic cancer ([place])  08/05/2021 Cancer Staging   Staging form: Exocrine Pancreas, AJCC 8th Edition - Clinical stage from 08/05/2021: Stage IB (cT2, cN0, cM0) - Signed by Truitt Merle, MD on 08/17/2021 Stage prefix: Initial diagnosis Total positive nodes: 0   08/14/2021 Initial Diagnosis   Pancreatic cancer ([place])   08/20/2021 - 03/23/2022 Chemotherapy   Patient is on Treatment Plan : PANCREATIC Abraxane / Gemcitabine D1,8 q21d     08/20/2021 -  Chemotherapy   Patient is on Treatment Plan : PANCREAS Gemcitabine D1,8 (1000) q21d x 8 Cycles     11/15/2021 Cancer Staging   Staging form: Exocrine Pancreas, AJCC 8th Edition - Pathologic stage from 11/15/2021: Stage IB (ypT2, pN0, cM0) - Signed by Truitt Merle, MD on 01/03/2022 Stage prefix: Post-therapy Total positive nodes: 0 Histologic grade (G): G3 Histologic grading system: 3 grade system Residual tumor (R): R0 - None   Pancreatic adenocarcinoma ([place])  11/15/2021 Initial Diagnosis   Pancreatic adenocarcinoma ([place])      INTERVAL HISTORY:  [name] is here for a follow up of pancreatic cancer. He was last seen by me on 05/05/22. He presents to the clinic accompanied by his daughter. His daughter reports he is nauseated, present for the last week. She feels the nausea is related to being off  creon. In addition to the nausea, she reports he is not eating as well and cannot tolerate protein shakes.   All other systems were reviewed with the patient and are negative.  MEDICAL HISTORY:  Past Medical History:  Diagnosis Date   Aneurysm ([place])    2017   Anxiety    Arthritis    Depression    Family history of breast cancer    Family history of lung cancer    Headache    HLD (hyperlipidemia)    Hx of inguinal hernia repair    Hypertension    Hypothyroidism    Overactive bladder    Pancreatic cancer ([facility])    Pneumonia    Shingles    Sleep apnea    Patient denies   Thoracic ascending aortic aneurysm ([place])    mildly dilated 4.0cm per 08/12/21 CT   Thyroid disease    Varicose veins of both lower extremities     SURGICAL HISTORY: Past Surgical History:  Procedure Laterality Date   BILATERAL CARPAL TUNNEL RELEASE     CATARACT EXTRACTION, BILATERAL Bilateral    ELBOW SURGERY Bilateral    ENDOSCOPIC RETROGRADE CHOLANGIOPANCREATOGRAPHY (ERCP) WITH PROPOFOL N/A 07/20/2021   Procedure: ENDOSCOPIC RETROGRADE CHOLANGIOPANCREATOGRAPHY (ERCP) WITH PROPOFOL;  Surgeon: Gatha Mayer, MD;  Location: [facility];  Service: Endoscopy;  Laterality: N/A;   ESOPHAGOGASTRODUODENOSCOPY (EGD) WITH PROPOFOL N/A 08/05/2021  Procedure: ESOPHAGOGASTRODUODENOSCOPY (EGD) WITH PROPOFOL;  Surgeon: Rush Landmark Telford Nab., MD;  Location: [place];  Service: Gastroenterology;  Laterality: N/A;   EUS N/A 08/05/2021   Procedure: UPPER ENDOSCOPIC ULTRASOUND (EUS) RADIAL;  Surgeon: Irving Copas., MD;  Location: [place];  Service: Gastroenterology;  Laterality: N/A;   EYE SURGERY     bilateral cataracts   FINE NEEDLE ASPIRATION  08/05/2021   Procedure: FINE NEEDLE ASPIRATION (FNA) LINEAR;  Surgeon: Irving Copas., MD;  Location: [place];  Service: Gastroenterology;;   INGUINAL HERNIA REPAIR Right 1951   IR ANGIO INTRA EXTRACRAN SEL COM CAROTID INNOMINATE BILAT MOD SED   02/20/2019   IR BILIARY STENT(S) EXISTING ACCESS INC DILATION CATH EXCHANGE  09/20/2021   IR ENDOLUMINAL BX OF BILIARY TREE  07/22/2021   IR GENERIC HISTORICAL  10/28/2016   IR RADIOLOGIST EVAL & MGMT 10/28/2016 [facility]   IR INT EXT BILIARY DRAIN WITH CHOLANGIOGRAM  07/22/2021   JOINT REPLACEMENT     LAPAROSCOPY N/A 11/15/2021   Procedure: LAPAROSCOPIC DIAGNOSTIC STAGING;  Surgeon: Dwan Bolt, MD;  Location: [place];  Service: General;  Laterality: N/A;   PORTACATH PLACEMENT N/A 08/19/2021   Procedure: INSERTION PORT-A-CATH;  Surgeon: Dwan Bolt, MD;  Location: [facility];  Service: General;  Laterality: N/A;  LMA   RADIOLOGY WITH ANESTHESIA N/A 10/21/2015   Procedure: RADIOLOGY WITH ANESTHESIA;  Surgeon: Luanne Bras, MD;  Location: [place];  Service: Radiology;  Laterality: N/A;   RADIOLOGY WITH ANESTHESIA N/A 02/20/2019   Procedure: [employer];  Surgeon: Luanne Bras, MD;  Location: [place];  Service: Radiology;  Laterality: N/A;   ROTATOR CUFF REPAIR Bilateral    VARICOSE VEIN SURGERY     WHIPPLE PROCEDURE N/A 11/15/2021   Procedure: WHIPPLE PROCEDURE;  Surgeon: Dwan Bolt, MD;  Location: [place];  Service: General;  Laterality: N/A;    I have reviewed the social history and family history with the patient and they are unchanged from previous note.  ALLERGIES:  has No Known Allergies.  MEDICATIONS:  Current Outpatient Medications  Medication Sig Dispense Refill   lipase/protease/amylase (CREON) 36000 UNITS CPEP capsule Take 1 capsule (36,000 Units total) by mouth 3 (three) times daily before meals. 180 capsule 0   acetaminophen (TYLENOL) 500 MG tablet Take 1 tablet (500 mg total) by mouth every 6 (six) hours as needed for mild pain. 30 tablet 0   apixaban (ELIQUIS) 5 MG TABS tablet Take 1 tablet (5 mg total) by mouth 2 (two) times daily. 60 tablet 2   Cholecalciferol (VITAMIN D3) 50 MCG (2000 UT) TABS Take 2,000 Units by mouth daily.     dexamethasone (DECADRON) 4  MG tablet Take 1 tablet (4 mg total) by mouth daily. 15 tablet 0   diltiazem (CARDIZEM) 30 MG tablet Take 1 tablet (30 mg total) by mouth 3 (three) times daily. 90 tablet 2   docusate sodium (COLACE) 100 MG capsule Take 1 capsule (100 mg total) by mouth 2 (two) times daily. (Patient taking differently: Take 100 mg by mouth 2 (two) times daily as needed.) 28 capsule 0   escitalopram (LEXAPRO) 20 MG tablet Take 1 tablet (20 mg total) by mouth daily. 90 tablet 1   famotidine (PEPCID) 20 MG tablet Take 1 tablet (20 mg total) by mouth 2 (two) times daily. 60 tablet 1   feeding supplement (ENSURE ENLIVE / ENSURE PLUS) LIQD Take 237 mLs by mouth 3 (three) times daily between meals. 237 mL 12   ferrous sulfate  325 (65 FE) MG EC tablet Take 1 tablet (325 mg total) by mouth daily with breakfast. 30 tablet 3   levothyroxine (SYNTHROID) 112 MCG tablet TAKE 1 TABLET (112 MCG TOTAL) BY MOUTH DAILY BEFORE BREAKFAST. 90 tablet 1   lovastatin (MEVACOR) 20 MG tablet Take 1 tablet (20 mg total) by mouth at bedtime. 90 tablet 0   mirtazapine (REMERON) 7.5 MG tablet Take 1 tablet (7.5 mg total) by mouth at bedtime. 90 tablet 0   Multiple Vitamin (MULTIVITAMIN WITH MINERALS) TABS tablet Take 1 tablet by mouth daily. 30 tablet 0   nystatin-diphenhydrAMINE-alum & mag hydroxide-simeth Take 5 MLs by mouth 3 (three) times daily as needed for mouth pain. 120 mL 1   ondansetron (ZOFRAN) 4 MG tablet Take 1 tablet (4 mg total) by mouth every 8 (eight) hours as needed for nausea or vomiting. 20 tablet 0   pantoprazole (PROTONIX) 40 MG tablet Take 1 tablet by mouth daily. 90 tablet 0   polyethylene glycol (MIRALAX / GLYCOLAX) 17 g packet Take 17 g by mouth daily as needed for moderate constipation. 14 each 0   vitamin B-12 (CYANOCOBALAMIN) 1000 MCG tablet Take 1,000 mcg by mouth 2 (two) times daily.     No current facility-administered medications for this visit.   Facility-Administered Medications Ordered in Other Visits   Medication Dose Route Frequency Provider Last Rate Last Admin   gemcitabine (GEMZAR) 1,406 mg in sodium chloride 0.9 % 250 mL chemo infusion  800 mg/m2 (Treatment Plan Recorded) Intravenous Once Truitt Merle, MD       heparin lock flush 100 unit/mL  500 Units Intracatheter Once PRN Truitt Merle, MD       sodium chloride flush (NS) 0.9 % injection 10 mL  10 mL Intracatheter PRN Truitt Merle, MD        PHYSICAL EXAMINATION: ECOG PERFORMANCE STATUS: 1 - Symptomatic but completely ambulatory  Vitals:   05/20/22 1402  BP: 121/77  Pulse: 78  Resp: 18  Temp: 97.7 F (36.5 C)  SpO2: 98%   Wt Readings from Last 3 Encounters:  05/20/22 128 lb 14.4 oz (58.5 kg)  05/05/22 136 lb 3.2 oz (61.8 kg)  04/28/22 132 lb 9.6 oz (60.1 kg)     GENERAL:alert, no distress and comfortable SKIN: skin color normal, no rashes or significant lesions EYES: normal, Conjunctiva are pink and non-injected, sclera clear  NEURO: alert & oriented x 3 with fluent speech  LABORATORY DATA:  I have reviewed the data as listed    Latest Ref Rng & Units 05/20/2022    1:21 PM 05/05/2022   10:33 AM 04/28/2022   12:49 PM  CBC  WBC 4.0 - 10.5 K/uL 20.5  5.8  15.3   Hemoglobin 13.0 - 17.0 g/dL 9.4  8.5  9.1   Hematocrit 39.0 - 52.0 % 29.0  25.8  28.2   Platelets 150 - 400 K/uL 535  278  337         Latest Ref Rng & Units 05/20/2022    1:21 PM 05/05/2022   10:33 AM 05/03/2022    4:59 PM  CMP  Glucose 70 - 99 mg/dL 102  87    BUN 8 - 23 mg/dL 19  12    Creatinine 0.61 - 1.24 mg/dL 1.52  0.98  1.00   Sodium 135 - 145 mmol/L 136  137    Potassium 3.5 - 5.1 mmol/L 4.3  3.8    Chloride 98 - 111 mmol/L 102  102  CO2 22 - 32 mmol/L 29  30    Calcium 8.9 - 10.3 mg/dL 8.6  8.8    Total Protein 6.5 - 8.1 g/dL 6.5  6.2    Total Bilirubin 0.3 - 1.2 mg/dL 0.6  0.5    Alkaline Phos 38 - 126 U/L 157  103    AST 15 - 41 U/L 25  28    ALT 0 - 44 U/L 11  20        RADIOGRAPHIC STUDIES: I have personally reviewed the  radiological images as listed and agreed with the findings in the report. No results found.    No orders of the defined types were placed in this encounter.  All questions were answered. The patient knows to call the clinic with any problems, questions or concerns. No barriers to learning was detected. The total time spent in the appointment was 30 minutes.     Truitt Merle, MD 05/20/2022   I, Wilburn Mylar, am acting as scribe for Truitt Merle, MD.   I have reviewed the above documentation for accuracy and completeness, and I agree with the above.

## 2022-05-20 NOTE — Patient Instructions (Signed)
[facility]COLOG[facility]   Discharge Instructions: Thank you for choosing [facility] to provide your oncology and hematology care.   If you have a lab appointment with the Cancer Center, please go directly to the Cancer Center and check in at the registration area.   Wear comfortable clothing and clothing appropriate for easy access to any Portacath or PICC line.   We strive to give you quality time with your provider. You may need to reschedule your appointment if you arrive late (15 or more minutes).  Arriving late affects you and other patients whose appointments are after yours.  Also, if you miss three or more appointments without notifying the office, you may be dismissed from the clinic at the provider's discretion.      For prescription refill requests, have your pharmacy contact our office and allow 72 hours for refills to be completed.    Today you received the following chemotherapy and/or immunotherapy agents: gemcitabine      To help prevent nausea and vomiting after your treatment, we encourage you to take your nausea medication as directed.  BELOW ARE SYMPTOMS THAT SHOULD BE REPORTED IMMEDIATELY: *FEVER GREATER THAN 100.4 F (38 C) OR HIGHER *CHILLS OR SWEATING *NAUSEA AND VOMITING THAT IS NOT CONTROLLED WITH YOUR NAUSEA MEDICATION *UNUSUAL SHORTNESS OF BREATH *UNUSUAL BRUISING OR BLEEDING *URINARY PROBLEMS (pain or burning when urinating, or frequent urination) *BOWEL PROBLEMS (unusual diarrhea, constipation, pain near the anus) TENDERNESS IN MOUTH AND THROAT WITH OR WITHOUT PRESENCE OF ULCERS (sore throat, sores in mouth, or a toothache) UNUSUAL RASH, SWELLING OR PAIN  UNUSUAL VAGINAL DISCHARGE OR ITCHING   Items with * indicate a potential emergency and should be followed up as soon as possible or go to the Emergency Department if any problems should occur.  Please show the CHEMOTHERAPY ALERT CARD or IMMUNOTHERAPY ALERT CARD at check-in  to the Emergency Department and triage nurse.  Should you have questions after your visit or need to cancel or reschedule your appointment, please contact [facility]  Dept: [phone]  and follow the prompts.  Office hours are 8:00 a.m. to 4:30 p.m. Monday - Friday. Please note that voicemails left after 4:00 p.m. may not be returned until the following business day.  We are closed weekends and major holidays. You have access to a nurse at all times for urgent questions. Please call the main number to the clinic Dept: [phone] and follow the prompts.   For any non-urgent questions, you may also contact your provider using MyChart. We now offer e-Visits for anyone 18 and older to request care online for non-urgent symptoms. For details visit [email or web address].   Also download the MyChart app! Go to the app store, search "MyChart", open the app, select [place], and log in with your MyChart username and password.  Masks are optional in the cancer centers. If you would like for your care team to wear a mask while they are taking care of you, please let them know. You may have one support person who is at least 82 years old accompany you for your appointments. 

## 2022-05-20 NOTE — Progress Notes (Signed)
Nutrition Follow-up:  Patient with pancreatic cancer. S/p whipple 11/15/21. He is receiving gemcitabine q21d.  Met with patient in infusion. He reports nausea, diarrhea, abdominal discomfort with oral intake. Patient previously taking Creon which was working well for him. He is unable to afford this. Patient given application for medication financial assistance through manufacturer. Daughter was completing this during visit today. Patient recalls biscuit with butter/jelly this morning. He was nauseated directly after eating.   Medications: remeron, protonix, zofran, creon  Labs: glucose 102, Cr 1.52, Ca 8.6  Anthropometrics: Wt 128 lb 14.4 oz today decreased 5.9% (8 lbs) in 2 weeks - this is severe  9/21 - 136 lb 3.2 oz  NUTRITION DIAGNOSIS: Severe malnutrition continues    INTERVENTION:  Educated on low fat diet until restarting Creon Discussed foods with protein - include lean proteins with meals/snacks Patient does not tolerate Ensure/Boost   MONITORING, EVALUATION, GOAL: weight trends, intake   NEXT VISIT: Thursday October 26 during infusion

## 2022-05-26 ENCOUNTER — Inpatient Hospital Stay: Payer: Medicare HMO

## 2022-05-26 VITALS — BP 113/62 | HR 50 | Temp 97.7°F | Resp 17 | Wt 127.2 lb

## 2022-05-26 DIAGNOSIS — C251 Malignant neoplasm of body of pancreas: Secondary | ICD-10-CM

## 2022-05-26 DIAGNOSIS — Z5111 Encounter for antineoplastic chemotherapy: Secondary | ICD-10-CM | POA: Diagnosis not present

## 2022-05-26 DIAGNOSIS — D509 Iron deficiency anemia, unspecified: Secondary | ICD-10-CM | POA: Diagnosis not present

## 2022-05-26 DIAGNOSIS — C250 Malignant neoplasm of head of pancreas: Secondary | ICD-10-CM | POA: Diagnosis not present

## 2022-05-26 DIAGNOSIS — R110 Nausea: Secondary | ICD-10-CM | POA: Diagnosis not present

## 2022-05-26 LAB — CMP (CANCER CENTER ONLY)
ALT: 19 U/L (ref 0–44)
AST: 34 U/L (ref 15–41)
Albumin: 3.4 g/dL — ABNORMAL LOW (ref 3.5–5.0)
Alkaline Phosphatase: 102 U/L (ref 38–126)
Anion gap: 6 (ref 5–15)
BUN: 20 mg/dL (ref 8–23)
CO2: 28 mmol/L (ref 22–32)
Calcium: 8.7 mg/dL — ABNORMAL LOW (ref 8.9–10.3)
Chloride: 102 mmol/L (ref 98–111)
Creatinine: 1.31 mg/dL — ABNORMAL HIGH (ref 0.61–1.24)
GFR, Estimated: 54 mL/min — ABNORMAL LOW (ref >60)
Glucose, Bld: 102 mg/dL — ABNORMAL HIGH (ref 70–99)
Potassium: 4.3 mmol/L (ref 3.5–5.1)
Sodium: 136 mmol/L (ref 135–145)
Total Bilirubin: 0.6 mg/dL (ref 0.3–1.2)
Total Protein: 6.3 g/dL — ABNORMAL LOW (ref 6.5–8.1)

## 2022-05-26 LAB — CBC WITH DIFFERENTIAL (CANCER CENTER ONLY)
Abs Immature Granulocytes: 0.01 10*3/uL (ref 0.00–0.07)
Basophils Absolute: 0 10*3/uL (ref 0.0–0.1)
Basophils Relative: 1 %
Eosinophils Absolute: 0.1 10*3/uL (ref 0.0–0.5)
Eosinophils Relative: 1 %
HCT: 26.1 % — ABNORMAL LOW (ref 39.0–52.0)
Hemoglobin: 8.6 g/dL — ABNORMAL LOW (ref 13.0–17.0)
Immature Granulocytes: 0 %
Lymphocytes Relative: 20 %
Lymphs Abs: 0.9 10*3/uL (ref 0.7–4.0)
MCH: 30 pg (ref 26.0–34.0)
MCHC: 33 g/dL (ref 30.0–36.0)
MCV: 90.9 fL (ref 80.0–100.0)
Monocytes Absolute: 0.1 10*3/uL (ref 0.1–1.0)
Monocytes Relative: 3 %
Neutro Abs: 3.2 10*3/uL (ref 1.7–7.7)
Neutrophils Relative %: 75 %
Platelet Count: 443 10*3/uL — ABNORMAL HIGH (ref 150–400)
RBC: 2.87 MIL/uL — ABNORMAL LOW (ref 4.22–5.81)
RDW: 22.1 % — ABNORMAL HIGH (ref 11.5–15.5)
WBC Count: 4.2 10*3/uL (ref 4.0–10.5)
nRBC: 0 % (ref 0.0–0.2)

## 2022-05-26 MED ORDER — SODIUM CHLORIDE 0.9% FLUSH
10.0000 mL | INTRAVENOUS | Status: DC | PRN
  Administered 2022-05-26: 10 mL

## 2022-05-26 MED ORDER — HEPARIN SOD (PORK) LOCK FLUSH 100 UNIT/ML IV SOLN
500.0000 [IU] | Freq: Once | INTRAVENOUS | Status: AC | PRN
  Administered 2022-05-26: 500 [IU]

## 2022-05-26 MED ORDER — DEXAMETHASONE 4 MG PO TABS
4.0000 mg | ORAL_TABLET | Freq: Once | ORAL | Status: AC
  Administered 2022-05-26: 4 mg via ORAL
  Filled 2022-05-26: qty 1

## 2022-05-26 MED ORDER — PALONOSETRON HCL INJECTION 0.25 MG/5ML
0.2500 mg | Freq: Once | INTRAVENOUS | Status: AC
  Administered 2022-05-26: 0.25 mg via INTRAVENOUS
  Filled 2022-05-26: qty 5

## 2022-05-26 MED ORDER — PROCHLORPERAZINE MALEATE 10 MG PO TABS
10.0000 mg | ORAL_TABLET | Freq: Once | ORAL | Status: AC
  Administered 2022-05-26: 10 mg via ORAL
  Filled 2022-05-26: qty 1

## 2022-05-26 MED ORDER — SODIUM CHLORIDE 0.9 % IV SOLN
800.0000 mg/m2 | Freq: Once | INTRAVENOUS | Status: AC
  Administered 2022-05-26: 1406 mg via INTRAVENOUS
  Filled 2022-05-26: qty 36.98

## 2022-05-26 MED ORDER — SODIUM CHLORIDE 0.9 % IV SOLN: Freq: Once | INTRAVENOUS | Status: AC

## 2022-05-26 NOTE — Patient Instructions (Signed)
[facility]COLOG[facility]  Discharge Instructions: Thank you for choosing [facility] to provide your oncology and hematology care.   If you have a lab appointment with the Cancer Center, please go directly to the Cancer Center and check in at the registration area.   Wear comfortable clothing and clothing appropriate for easy access to any Portacath or PICC line.   We strive to give you quality time with your provider. You may need to reschedule your appointment if you arrive late (15 or more minutes).  Arriving late affects you and other patients whose appointments are after yours.  Also, if you miss three or more appointments without notifying the office, you may be dismissed from the clinic at the provider's discretion.      For prescription refill requests, have your pharmacy contact our office and allow 72 hours for refills to be completed.    Today you received the following chemotherapy and/or immunotherapy agents: Gemzar      To help prevent nausea and vomiting after your treatment, we encourage you to take your nausea medication as directed.  BELOW ARE SYMPTOMS THAT SHOULD BE REPORTED IMMEDIATELY: *FEVER GREATER THAN 100.4 F (38 C) OR HIGHER *CHILLS OR SWEATING *NAUSEA AND VOMITING THAT IS NOT CONTROLLED WITH YOUR NAUSEA MEDICATION *UNUSUAL SHORTNESS OF BREATH *UNUSUAL BRUISING OR BLEEDING *URINARY PROBLEMS (pain or burning when urinating, or frequent urination) *BOWEL PROBLEMS (unusual diarrhea, constipation, pain near the anus) TENDERNESS IN MOUTH AND THROAT WITH OR WITHOUT PRESENCE OF ULCERS (sore throat, sores in mouth, or a toothache) UNUSUAL RASH, SWELLING OR PAIN  UNUSUAL VAGINAL DISCHARGE OR ITCHING   Items with * indicate a potential emergency and should be followed up as soon as possible or go to the Emergency Department if any problems should occur.  Please show the CHEMOTHERAPY ALERT CARD or IMMUNOTHERAPY ALERT CARD at check-in to the  Emergency Department and triage nurse.  Should you have questions after your visit or need to cancel or reschedule your appointment, please contact [facility]  Dept: [phone]  and follow the prompts.  Office hours are 8:00 a.m. to 4:30 p.m. Monday - Friday. Please note that voicemails left after 4:00 p.m. may not be returned until the following business day.  We are closed weekends and major holidays. You have access to a nurse at all times for urgent questions. Please call the main number to the clinic Dept: [phone] and follow the prompts.   For any non-urgent questions, you may also contact your provider using MyChart. We now offer e-Visits for anyone 18 and older to request care online for non-urgent symptoms. For details visit [email or web address].   Also download the MyChart app! Go to the app store, search "MyChart", open the app, select , and log in with your MyChart username and password.  Masks are optional in the cancer centers. If you would like for your care team to wear a mask while they are taking care of you, please let them know. You may have one support person who is at least 82 years old accompany you for your appointments. 

## 2022-05-27 LAB — CANCER ANTIGEN 19-9: CA 19-9: 15 U/mL (ref 0–35)

## 2022-06-09 ENCOUNTER — Inpatient Hospital Stay: Payer: Medicare HMO | Admitting: Nutrition

## 2022-06-09 ENCOUNTER — Encounter: Payer: Self-pay | Admitting: Hematology

## 2022-06-09 ENCOUNTER — Inpatient Hospital Stay: Payer: Medicare HMO

## 2022-06-09 ENCOUNTER — Inpatient Hospital Stay (HOSPITAL_BASED_OUTPATIENT_CLINIC_OR_DEPARTMENT_OTHER): Payer: Medicare HMO | Admitting: Hematology

## 2022-06-09 VITALS — BP 147/82 | HR 69 | Temp 97.6°F | Resp 18 | Ht 70.8 in | Wt 126.5 lb

## 2022-06-09 DIAGNOSIS — R110 Nausea: Secondary | ICD-10-CM | POA: Diagnosis not present

## 2022-06-09 DIAGNOSIS — C251 Malignant neoplasm of body of pancreas: Secondary | ICD-10-CM

## 2022-06-09 DIAGNOSIS — C250 Malignant neoplasm of head of pancreas: Secondary | ICD-10-CM | POA: Diagnosis not present

## 2022-06-09 DIAGNOSIS — D509 Iron deficiency anemia, unspecified: Secondary | ICD-10-CM | POA: Diagnosis not present

## 2022-06-09 DIAGNOSIS — Z5111 Encounter for antineoplastic chemotherapy: Secondary | ICD-10-CM | POA: Diagnosis not present

## 2022-06-09 LAB — CBC WITH DIFFERENTIAL (CANCER CENTER ONLY)
Abs Immature Granulocytes: 0.02 10*3/uL (ref 0.00–0.07)
Basophils Absolute: 0 10*3/uL (ref 0.0–0.1)
Basophils Relative: 1 %
Eosinophils Absolute: 0.1 10*3/uL (ref 0.0–0.5)
Eosinophils Relative: 3 %
HCT: 24.8 % — ABNORMAL LOW (ref 39.0–52.0)
Hemoglobin: 8.1 g/dL — ABNORMAL LOW (ref 13.0–17.0)
Immature Granulocytes: 1 %
Lymphocytes Relative: 20 %
Lymphs Abs: 0.9 10*3/uL (ref 0.7–4.0)
MCH: 29.9 pg (ref 26.0–34.0)
MCHC: 32.7 g/dL (ref 30.0–36.0)
MCV: 91.5 fL (ref 80.0–100.0)
Monocytes Absolute: 0.6 10*3/uL (ref 0.1–1.0)
Monocytes Relative: 15 %
Neutro Abs: 2.5 10*3/uL (ref 1.7–7.7)
Neutrophils Relative %: 60 %
Platelet Count: 284 10*3/uL (ref 150–400)
RBC: 2.71 MIL/uL — ABNORMAL LOW (ref 4.22–5.81)
RDW: 21.8 % — ABNORMAL HIGH (ref 11.5–15.5)
WBC Count: 4.2 10*3/uL (ref 4.0–10.5)
nRBC: 0 % (ref 0.0–0.2)

## 2022-06-09 LAB — CMP (CANCER CENTER ONLY)
ALT: 11 U/L (ref 0–44)
AST: 23 U/L (ref 15–41)
Albumin: 3.4 g/dL — ABNORMAL LOW (ref 3.5–5.0)
Alkaline Phosphatase: 90 U/L (ref 38–126)
Anion gap: 5 (ref 5–15)
BUN: 17 mg/dL (ref 8–23)
CO2: 29 mmol/L (ref 22–32)
Calcium: 8.5 mg/dL — ABNORMAL LOW (ref 8.9–10.3)
Chloride: 104 mmol/L (ref 98–111)
Creatinine: 1.31 mg/dL — ABNORMAL HIGH (ref 0.61–1.24)
GFR, Estimated: 54 mL/min — ABNORMAL LOW (ref >60)
Glucose, Bld: 104 mg/dL — ABNORMAL HIGH (ref 70–99)
Potassium: 4.3 mmol/L (ref 3.5–5.1)
Sodium: 138 mmol/L (ref 135–145)
Total Bilirubin: 0.8 mg/dL (ref 0.3–1.2)
Total Protein: 5.8 g/dL — ABNORMAL LOW (ref 6.5–8.1)

## 2022-06-09 MED ORDER — HEPARIN SOD (PORK) LOCK FLUSH 100 UNIT/ML IV SOLN
500.0000 [IU] | Freq: Once | INTRAVENOUS | Status: AC | PRN
  Administered 2022-06-09: 500 [IU]

## 2022-06-09 MED ORDER — SODIUM CHLORIDE 0.9 % IV SOLN
800.0000 mg/m2 | Freq: Once | INTRAVENOUS | Status: AC
  Administered 2022-06-09: 1406 mg via INTRAVENOUS
  Filled 2022-06-09: qty 36.98

## 2022-06-09 MED ORDER — SODIUM CHLORIDE 0.9 % IV SOLN: Freq: Once | INTRAVENOUS | Status: AC

## 2022-06-09 MED ORDER — PROCHLORPERAZINE MALEATE 10 MG PO TABS: 10.0000 mg | ORAL_TABLET | Freq: Four times a day (QID) | ORAL | 1 refills | Status: DC | PRN

## 2022-06-09 MED ORDER — DEXAMETHASONE 4 MG PO TABS
4.0000 mg | ORAL_TABLET | Freq: Once | ORAL | Status: AC
  Administered 2022-06-09: 4 mg via ORAL
  Filled 2022-06-09: qty 1

## 2022-06-09 MED ORDER — PROCHLORPERAZINE MALEATE 10 MG PO TABS
10.0000 mg | ORAL_TABLET | Freq: Once | ORAL | Status: AC
  Administered 2022-06-09: 10 mg via ORAL
  Filled 2022-06-09: qty 1

## 2022-06-09 MED ORDER — PALONOSETRON HCL INJECTION 0.25 MG/5ML
0.2500 mg | Freq: Once | INTRAVENOUS | Status: AC
  Administered 2022-06-09: 0.25 mg via INTRAVENOUS
  Filled 2022-06-09: qty 5

## 2022-06-09 MED ORDER — SODIUM CHLORIDE 0.9% FLUSH
10.0000 mL | INTRAVENOUS | Status: DC | PRN
  Administered 2022-06-09: 10 mL

## 2022-06-09 NOTE — Progress Notes (Signed)
Nutrition follow-up completed with patient and daughter.  Patient with pancreatic cancer.  Status post Whipple November 15, 2021.  He is receiving gemcitabine every 21 days.  Weight documented as 126 pounds 8 ounces on October 26.  This is decreased from 128 pounds 14.4 ounces October 6.  Labs include glucose 104, hemoglobin 8.1, creatinine 1.31, and albumin 3.4.  Patient continues to have frequent nausea, diarrhea, and abdominal pain with oral intake.  He has not been able to afford Creon.  Today, he reports stomach pain.  He believes this is because he ate too much food yesterday.  MD added Compazine.  Providers are working on financial assistance through the manufacturer for patient to receive Creon.  Nutrition diagnosis: Severe malnutrition continues.  Intervention: Encourage patient to continue low-fat diet as tolerated. Educated patient on clear liquid oral nutrition supplements which will provide additional calories and protein.  I provided samples. Encouraged patient to contact surgeon's office and request samples of Creon.   Educated on strategies for improving nausea and provided nutrition fact sheet. Provided support and encouragement.  Monitoring, evaluation, goals: Patient will tolerate increased calories and protein for weight gain with decreased nausea, pain, and diarrhea.  Next visit: To be scheduled with upcoming treatment.  Patient has contact information if needed prior to follow-up.  **Disclaimer: This note was dictated with voice recognition software. Similar sounding words can inadvertently be transcribed and this note may contain transcription errors which may not have been corrected upon publication of note.**

## 2022-06-09 NOTE — Progress Notes (Signed)
[place]   [phone] [phone]   Clinic Follow up Note   Patient Care Team: Ronnell Freshwater, NP as PCP - General (Family Medicine) Buford Dresser, MD as PCP - Cardiology (Cardiology) Truitt Merle, MD as Consulting Physician (Oncology) Royston Bake, RN as Oncology Nurse Navigator (Oncology)  Date of Service:  06/09/2022  CHIEF COMPLAINT: f/u of pancreatic cancer  CURRENT THERAPY:  Gemcitabine, days 1 and 8 q21d, starting 01/20/22  ASSESSMENT & PLAN:  Angel Witts. is a 81 y.o. male with   1. 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 EUS and pancreatic mass biopsy on 08/05/21 confirmed adenocarcinoma. -Pancreatic protocol CT scan showed 3.6 x 2.6 cm pancreatic head mass, partially involving multiple vessels, including SMV, portal vein, and hepatic artery.  -he began neoadjuvant gemcitabine alone on 08/20/21. Abraxane was added with cycle 2 (09/17/21), but he tolerated poorly and discontinued after first dose. -s/p whipple procedure on 11/15/21 with Dr. Zenia Resides. 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 Margins and lymph nodes negative. -he restarted adjuvant gemcitabine on 01/20/22, plan for 5 months. He tolerates well, no sickness or fatigue from infusion. -restaging CT CAP on 05/03/22 showed NED. -his daughter reports he is experiencing nausea and abdominal pain after eating, likely related to being off Creon -Lab reviewed, adequate for treatment, will proceed last cycle of chemotherapy today. -We discussed cancer surveillance plan. I will see him back in a month     2. Diarrhea, abdominal cramping, weight loss, nausea -secondary to pancreatic surgery -he started creon 01/04/22, not currently taking due to cost. They are applying for manufacturer assistance. -I will call in compazine for his  nausea   3. HTN -he is holding his BP medicines during chemotherapy.   4. Microcytic anemia -Iron panel 01/20/22 confirmed iron deficiency anemia-- ferritin 9, iron serum 25.  -prescribed ferrous sulfate 325 mg that day -hgb down to 8.1 today (06/09/22). I discussed the option of blood transfusion. Given this his last cycle, I feel we can watch it.     PLAN -proceed with gemcitabine today  -nutrition will see him today -I called in compazine for nausea -lab, flush, and gemcitabineon 11/2 as scheduled (last planned dose)  -Udenyca on 11/3 -lab, flush, and f/u in 5-6 weeks    No problem-specific Assessment & Plan notes found for this encounter.   SUMMARY OF ONCOLOGIC HISTORY: Oncology History Overview Note   Cancer Staging  Pancreatic cancer [facility]) Staging form: Exocrine Pancreas, AJCC 8th Edition - Clinical stage from 08/05/2021: Stage IB (cT2, cN0, cM0) - Signed by Truitt Merle, MD on 08/17/2021 Stage prefix: Initial diagnosis Total positive nodes: 0     Pancreatic mass  07/18/2021 Initial Diagnosis   Pancreatic mass   07/18/2021 Imaging   CT Abdomen Pelvis W Contrast  IMPRESSION: 1. Suspected solid mass within the proximal body of the pancreas measures 2.3 x 2.6 x 2.0 cm. There is downstream dilation of the main pancreatic duct and its branches. There is also intra and extrahepatic biliary ductal dilation. Further evaluation with MRI of the abdomen, pancreatic protocol, when clinically feasible, may be considered. 2. 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 3. Aortic atherosclerosis   07/20/2021 Procedure   DG ERCP  IMPRESSION: Nondiagnostic ERCP as above. Correlation with the operative report is advised.  - The examination was suspicious for a biliary stricture in the  bile duct. - Examination was suspicious for carcinoma of the head of the pancreas. - Attempts at a cholangiogram failed.   07/22/2021 Pathology Results   CASE: [id number]    FINAL MICROSCOPIC DIAGNOSIS:  - Suspicious for malignancy  - See comment   DIAGNOSTIC COMMENTS:  There are rare cells with cytologic atypia suspicious for  adenocarcinoma.  Dr. Vic Ripper agrees.    07/22/2021 Procedure   IR INT EXT BILIARY DRAIN WITH CHOLANGIOGRAM ( IMPRESSION: 1. 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 2. Successful brush biopsy of obstructing lesion x3. 3. Successful placement of a 10 French internal/external biliary drainage catheter.   PLAN: 1. Follow bilirubin. When significant downward trend is clear, the bag should be capped. Recommend capping bag before discharge home if possible. 2. Follow-up in IR in 4-6 weeks for initial biliary tube check and exchange. If initial brush biopsies are negative, repeat biopsy could be considered at that time.   Pancreatic cancer ([place])  08/05/2021 Cancer Staging   Staging form: Exocrine Pancreas, AJCC 8th Edition - Clinical stage from 08/05/2021: Stage IB (cT2, cN0, cM0) - Signed by Truitt Merle, MD on 08/17/2021 Stage prefix: Initial diagnosis Total positive nodes: 0   08/14/2021 Initial Diagnosis   Pancreatic cancer ([place])   08/20/2021 - 03/23/2022 Chemotherapy   Patient is on Treatment Plan : PANCREATIC Abraxane / Gemcitabine D1,8 q21d     08/20/2021 -  Chemotherapy   Patient is on Treatment Plan : PANCREAS Gemcitabine D1,8 (1000) q21d x 8 Cycles     11/15/2021 Cancer Staging   Staging form: Exocrine Pancreas, AJCC 8th Edition - Pathologic stage from 11/15/2021: Stage IB (ypT2, pN0, cM0) - Signed by Truitt Merle, MD on 01/03/2022 Stage prefix: Post-therapy Total positive nodes: 0 Histologic grade (G): G3 Histologic grading system: 3 grade system Residual tumor (R): R0 - None   Pancreatic adenocarcinoma ([place])  11/15/2021 Initial Diagnosis   Pancreatic adenocarcinoma ([place])      INTERVAL HISTORY:  Angel Celeste Sr. is here for a follow up of pancreatic  cancer. He was last seen by me on 05/20/22. He presents to the clinic accompanied by his daughter. He reports he has a stomach ache today; he explains he ate a lot yesterday. His daughter adds that he gets nauseated daily.   All other systems were reviewed with the patient and are negative.  MEDICAL HISTORY:  Past Medical History:  Diagnosis Date   Aneurysm ([place])    2017   Anxiety    Arthritis    Depression    Family history of breast cancer    Family history of lung cancer    Headache    HLD (hyperlipidemia)    Hx of inguinal hernia repair    Hypertension    Hypothyroidism    Overactive bladder    Pancreatic cancer ([facility])    Pneumonia    Shingles    Sleep apnea    Patient denies   Thoracic ascending aortic aneurysm ([place])    mildly dilated 4.0cm per 08/12/21 CT   Thyroid disease    Varicose veins of both lower extremities     SURGICAL HISTORY: Past Surgical History:  Procedure Laterality Date   BILATERAL CARPAL TUNNEL RELEASE     CATARACT EXTRACTION, BILATERAL Bilateral    ELBOW SURGERY Bilateral    ENDOSCOPIC RETROGRADE CHOLANGIOPANCREATOGRAPHY (ERCP) WITH PROPOFOL N/A 07/20/2021   Procedure: ENDOSCOPIC RETROGRADE CHOLANGIOPANCREATOGRAPHY (ERCP) WITH PROPOFOL;  Surgeon: Gatha Mayer, MD;  Location: [place];  Service: Endoscopy;  Laterality: N/A;   ESOPHAGOGASTRODUODENOSCOPY (EGD) WITH PROPOFOL N/A 08/05/2021   Procedure: ESOPHAGOGASTRODUODENOSCOPY (EGD) WITH PROPOFOL;  Surgeon: Rush Landmark Telford Nab., MD;  Location: [place];  Service: Gastroenterology;  Laterality: N/A;   EUS N/A 08/05/2021   Procedure: UPPER ENDOSCOPIC ULTRASOUND (EUS) RADIAL;  Surgeon: Irving Copas., MD;  Location: [place];  Service: Gastroenterology;  Laterality: N/A;   EYE SURGERY     bilateral cataracts   FINE NEEDLE ASPIRATION  08/05/2021   Procedure: FINE NEEDLE ASPIRATION (FNA) LINEAR;  Surgeon: Irving Copas., MD;  Location: [place];  Service:  Gastroenterology;;   INGUINAL HERNIA REPAIR Right 1951   IR ANGIO INTRA EXTRACRAN SEL COM CAROTID INNOMINATE BILAT MOD SED  02/20/2019   IR BILIARY STENT(S) EXISTING ACCESS INC DILATION CATH EXCHANGE  09/20/2021   IR ENDOLUMINAL BX OF BILIARY TREE  07/22/2021   IR GENERIC HISTORICAL  10/28/2016   IR RADIOLOGIST EVAL & MGMT 10/28/2016 [facility]INTER[facility]   IR INT EXT BILIARY DRAIN WITH CHOLANGIOGRAM  07/22/2021   JOINT REPLACEMENT     LAPAROSCOPY N/A 11/15/2021   Procedure: LAPAROSCOPIC DIAGNOSTIC STAGING;  Surgeon: Dwan Bolt, MD;  Location: [place];  Service: General;  Laterality: N/A;   PORTACATH PLACEMENT N/A 08/19/2021   Procedure: INSERTION PORT-A-CATH;  Surgeon: Dwan Bolt, MD;  Location: [facility];  Service: General;  Laterality: N/A;  LMA   RADIOLOGY WITH ANESTHESIA N/A 10/21/2015   Procedure: RADIOLOGY WITH ANESTHESIA;  Surgeon: Luanne Bras, MD;  Location: [place];  Service: Radiology;  Laterality: N/A;   RADIOLOGY WITH ANESTHESIA N/A 02/20/2019   Procedure: [employer];  Surgeon: Luanne Bras, MD;  Location: [place];  Service: Radiology;  Laterality: N/A;   ROTATOR CUFF REPAIR Bilateral    VARICOSE VEIN SURGERY     WHIPPLE PROCEDURE N/A 11/15/2021   Procedure: WHIPPLE PROCEDURE;  Surgeon: Dwan Bolt, MD;  Location: [place];  Service: General;  Laterality: N/A;    I have reviewed the social history and family history with the patient and they are unchanged from previous note.  ALLERGIES:  has No Known Allergies.  MEDICATIONS:  Current Outpatient Medications  Medication Sig Dispense Refill   acetaminophen (TYLENOL) 500 MG tablet Take 1 tablet (500 mg total) by mouth every 6 (six) hours as needed for mild pain. 30 tablet 0   apixaban (ELIQUIS) 5 MG TABS tablet Take 1 tablet (5 mg total) by mouth 2 (two) times daily. 60 tablet 2   Cholecalciferol (VITAMIN D3) 50 MCG (2000 UT) TABS Take 2,000 Units by mouth daily.     dexamethasone (DECADRON) 4 MG tablet Take 1 tablet (4 mg  total) by mouth daily. 15 tablet 0   diltiazem (CARDIZEM) 30 MG tablet Take 1 tablet (30 mg total) by mouth 3 (three) times daily. 90 tablet 2   docusate sodium (COLACE) 100 MG capsule Take 1 capsule (100 mg total) by mouth 2 (two) times daily. (Patient taking differently: Take 100 mg by mouth 2 (two) times daily as needed.) 28 capsule 0   escitalopram (LEXAPRO) 20 MG tablet Take 1 tablet (20 mg total) by mouth daily. 90 tablet 1   famotidine (PEPCID) 20 MG tablet Take 1 tablet (20 mg total) by mouth 2 (two) times daily. 60 tablet 1   feeding supplement (ENSURE ENLIVE / ENSURE PLUS) LIQD Take 237 mLs by mouth 3 (three) times daily between meals. 237 mL 12   ferrous sulfate 325 (65 FE) MG EC tablet Take 1 tablet (325  mg total) by mouth daily with breakfast. 30 tablet 3   levothyroxine (SYNTHROID) 112 MCG tablet TAKE 1 TABLET (112 MCG TOTAL) BY MOUTH DAILY BEFORE BREAKFAST. 90 tablet 1   lipase/protease/amylase (CREON) 36000 UNITS CPEP capsule Take 1 capsule (36,000 Units total) by mouth 3 (three) times daily before meals. 180 capsule 0   lovastatin (MEVACOR) 20 MG tablet Take 1 tablet (20 mg total) by mouth at bedtime. 90 tablet 0   mirtazapine (REMERON) 7.5 MG tablet Take 1 tablet (7.5 mg total) by mouth at bedtime. 90 tablet 0   Multiple Vitamin (MULTIVITAMIN WITH MINERALS) TABS tablet Take 1 tablet by mouth daily. 30 tablet 0   nystatin-diphenhydrAMINE-alum & mag hydroxide-simeth Take 5 MLs by mouth 3 (three) times daily as needed for mouth pain. 120 mL 1   pantoprazole (PROTONIX) 40 MG tablet Take 1 tablet by mouth daily. 90 tablet 0   polyethylene glycol (MIRALAX / GLYCOLAX) 17 g packet Take 17 g by mouth daily as needed for moderate constipation. 14 each 0   prochlorperazine (COMPAZINE) 10 MG tablet Take 1 tablet (10 mg total) by mouth every 6 (six) hours as needed (Nausea or vomiting). 30 tablet 1   vitamin B-12 (CYANOCOBALAMIN) 1000 MCG tablet Take 1,000 mcg by mouth 2 (two) times daily.      No current facility-administered medications for this visit.   Facility-Administered Medications Ordered in Other Visits  Medication Dose Route Frequency Provider Last Rate Last Admin   sodium chloride flush (NS) 0.9 % injection 10 mL  10 mL Intracatheter PRN Truitt Merle, MD   10 mL at 06/09/22 1538    PHYSICAL EXAMINATION: ECOG PERFORMANCE STATUS: 1 - Symptomatic but completely ambulatory  Vitals:   06/09/22 1217  BP: (!) 147/82  Pulse: 69  Resp: 18  Temp: 97.6 F (36.4 C)  SpO2: 99%   Wt Readings from Last 3 Encounters:  06/09/22 126 lb 8 oz (57.4 kg)  05/26/22 127 lb 4 oz (57.7 kg)  05/20/22 128 lb 14.4 oz (58.5 kg)     GENERAL:alert, no distress and comfortable SKIN: skin color normal, no rashes or significant lesions EYES: normal, Conjunctiva are pink and non-injected, sclera clear  NEURO: alert & oriented x 3 with fluent speech  LABORATORY DATA:  I have reviewed the data as listed    Latest Ref Rng & Units 06/09/2022   11:37 AM 05/26/2022    1:11 PM 05/20/2022    1:21 PM  CBC  WBC 4.0 - 10.5 K/uL 4.2  4.2  20.5   Hemoglobin 13.0 - 17.0 g/dL 8.1  8.6  9.4   Hematocrit 39.0 - 52.0 % 24.8  26.1  29.0   Platelets 150 - 400 K/uL 284  443  535         Latest Ref Rng & Units 06/09/2022   11:37 AM 05/26/2022    1:11 PM 05/20/2022    1:21 PM  CMP  Glucose 70 - 99 mg/dL 104  102  102   BUN 8 - 23 mg/dL '17  20  19   ' Creatinine 0.61 - 1.24 mg/dL 1.31  1.31  1.52   Sodium 135 - 145 mmol/L 138  136  136   Potassium 3.5 - 5.1 mmol/L 4.3  4.3  4.3   Chloride 98 - 111 mmol/L 104  102  102   CO2 22 - 32 mmol/L '29  28  29   ' Calcium 8.9 - 10.3 mg/dL 8.5  8.7  8.6   Total  Protein 6.5 - 8.1 g/dL 5.8  6.3  6.5   Total Bilirubin 0.3 - 1.2 mg/dL 0.8  0.6  0.6   Alkaline Phos 38 - 126 U/L 90  102  157   AST 15 - 41 U/L 23  34  25   ALT 0 - 44 U/L '11  19  11       ' RADIOGRAPHIC STUDIES: I have personally reviewed the radiological images as listed and agreed with the  findings in the report. No results found.    No orders of the defined types were placed in this encounter.  All questions were answered. The patient knows to call the clinic with any problems, questions or concerns. No barriers to learning was detected. The total time spent in the appointment was 30 minutes.     Truitt Merle, MD 06/09/2022   I, Wilburn Mylar, am acting as scribe for Truitt Merle, MD.   I have reviewed the above documentation for accuracy and completeness, and I agree with the above.

## 2022-06-09 NOTE — Patient Instructions (Signed)
[facility] MEDICAL [facility]COLOGY   Discharge Instructions: Thank you for choosing [facility] to provide your oncology and hematology care.   If you have a lab appointment with the Cancer Center, please go directly to the Cancer Center and check in at the registration area.   Wear comfortable clothing and clothing appropriate for easy access to any Portacath or PICC line.   We strive to give you quality time with your provider. You may need to reschedule your appointment if you arrive late (15 or more minutes).  Arriving late affects you and other patients whose appointments are after yours.  Also, if you miss three or more appointments without notifying the office, you may be dismissed from the clinic at the provider's discretion.      For prescription refill requests, have your pharmacy contact our office and allow 72 hours for refills to be completed.    Today you received the following chemotherapy and/or immunotherapy agents: gemcitabine      To help prevent nausea and vomiting after your treatment, we encourage you to take your nausea medication as directed.  BELOW ARE SYMPTOMS THAT SHOULD BE REPORTED IMMEDIATELY: *FEVER GREATER THAN 100.4 F (38 C) OR HIGHER *CHILLS OR SWEATING *NAUSEA AND VOMITING THAT IS NOT CONTROLLED WITH YOUR NAUSEA MEDICATION *UNUSUAL SHORTNESS OF BREATH *UNUSUAL BRUISING OR BLEEDING *URINARY PROBLEMS (pain or burning when urinating, or frequent urination) *BOWEL PROBLEMS (unusual diarrhea, constipation, pain near the anus) TENDERNESS IN MOUTH AND THROAT WITH OR WITHOUT PRESENCE OF ULCERS (sore throat, sores in mouth, or a toothache) UNUSUAL RASH, SWELLING OR PAIN  UNUSUAL VAGINAL DISCHARGE OR ITCHING   Items with * indicate a potential emergency and should be followed up as soon as possible or go to the Emergency Department if any problems should occur.  Please show the CHEMOTHERAPY ALERT CARD or IMMUNOTHERAPY ALERT CARD at check-in  to the Emergency Department and triage nurse.  Should you have questions after your visit or need to cancel or reschedule your appointment, please contact [facility]ICAL [facility]COL[facility]  Dept: [phone]  and follow the prompts.  Office hours are 8:00 a.m. to 4:30 p.m. Monday - Friday. Please note that voicemails left after 4:00 p.m. may not be returned until the following business day.  We are closed weekends and major holidays. You have access to a nurse at all times for urgent questions. Please call the main number to the clinic Dept: [phone] and follow the prompts.   For any non-urgent questions, you may also contact your provider using MyChart. We now offer e-Visits for anyone 18 and older to request care online for non-urgent symptoms. For details visit [email or web address].   Also download the MyChart app! Go to the app store, search "MyChart", open the app, select [place], and log in with your MyChart username and password.  Masks are optional in the cancer centers. If you would like for your care team to wear a mask while they are taking care of you, please let them know. You may have one support person who is at least 82 years old accompany you for your appointments. 

## 2022-06-10 ENCOUNTER — Other Ambulatory Visit: Payer: Self-pay

## 2022-06-11 ENCOUNTER — Other Ambulatory Visit: Payer: Self-pay

## 2022-06-16 ENCOUNTER — Other Ambulatory Visit: Payer: Self-pay

## 2022-06-16 ENCOUNTER — Ambulatory Visit (HOSPITAL_BASED_OUTPATIENT_CLINIC_OR_DEPARTMENT_OTHER)
Admission: RE | Admit: 2022-06-16 | Discharge: 2022-06-16 | Disposition: A | Discharge status: Home / Self Care | Payer: Medicare HMO | Source: Ambulatory Visit | Attending: Hematology | Admitting: Hematology

## 2022-06-16 ENCOUNTER — Inpatient Hospital Stay: Payer: Medicare HMO

## 2022-06-16 ENCOUNTER — Inpatient Hospital Stay: Payer: Medicare HMO | Attending: Physician Assistant

## 2022-06-16 VITALS — BP 180/82 | HR 68 | Temp 97.8°F | Resp 16 | Ht 70.0 in | Wt 129.8 lb

## 2022-06-16 DIAGNOSIS — Z5111 Encounter for antineoplastic chemotherapy: Secondary | ICD-10-CM | POA: Diagnosis not present

## 2022-06-16 DIAGNOSIS — D509 Iron deficiency anemia, unspecified: Secondary | ICD-10-CM | POA: Diagnosis not present

## 2022-06-16 DIAGNOSIS — C251 Malignant neoplasm of body of pancreas: Secondary | ICD-10-CM

## 2022-06-16 DIAGNOSIS — M7989 Other specified soft tissue disorders: Secondary | ICD-10-CM | POA: Insufficient documentation

## 2022-06-16 DIAGNOSIS — C250 Malignant neoplasm of head of pancreas: Secondary | ICD-10-CM | POA: Insufficient documentation

## 2022-06-16 DIAGNOSIS — Z95828 Presence of other vascular implants and grafts: Secondary | ICD-10-CM

## 2022-06-16 DIAGNOSIS — D649 Anemia, unspecified: Secondary | ICD-10-CM

## 2022-06-16 DIAGNOSIS — Z5189 Encounter for other specified aftercare: Secondary | ICD-10-CM | POA: Diagnosis not present

## 2022-06-16 LAB — CMP (CANCER CENTER ONLY)
ALT: 14 U/L (ref 0–44)
AST: 27 U/L (ref 15–41)
Albumin: 3.2 g/dL — ABNORMAL LOW (ref 3.5–5.0)
Alkaline Phosphatase: 69 U/L (ref 38–126)
Anion gap: 5 (ref 5–15)
BUN: 21 mg/dL (ref 8–23)
CO2: 29 mmol/L (ref 22–32)
Calcium: 8.4 mg/dL — ABNORMAL LOW (ref 8.9–10.3)
Chloride: 102 mmol/L (ref 98–111)
Creatinine: 1.24 mg/dL (ref 0.61–1.24)
GFR, Estimated: 58 mL/min — ABNORMAL LOW (ref >60)
Glucose, Bld: 110 mg/dL — ABNORMAL HIGH (ref 70–99)
Potassium: 4.1 mmol/L (ref 3.5–5.1)
Sodium: 136 mmol/L (ref 135–145)
Total Bilirubin: 0.6 mg/dL (ref 0.3–1.2)
Total Protein: 5.6 g/dL — ABNORMAL LOW (ref 6.5–8.1)

## 2022-06-16 LAB — CBC WITH DIFFERENTIAL (CANCER CENTER ONLY)
Abs Immature Granulocytes: 0.01 10*3/uL (ref 0.00–0.07)
Basophils Absolute: 0 10*3/uL (ref 0.0–0.1)
Basophils Relative: 1 %
Eosinophils Absolute: 0 10*3/uL (ref 0.0–0.5)
Eosinophils Relative: 2 %
HCT: 23.1 % — ABNORMAL LOW (ref 39.0–52.0)
Hemoglobin: 7.7 g/dL — ABNORMAL LOW (ref 13.0–17.0)
Immature Granulocytes: 1 %
Lymphocytes Relative: 50 %
Lymphs Abs: 0.8 10*3/uL (ref 0.7–4.0)
MCH: 30.1 pg (ref 26.0–34.0)
MCHC: 33.3 g/dL (ref 30.0–36.0)
MCV: 90.2 fL (ref 80.0–100.0)
Monocytes Absolute: 0.1 10*3/uL (ref 0.1–1.0)
Monocytes Relative: 7 %
Neutro Abs: 0.6 10*3/uL — ABNORMAL LOW (ref 1.7–7.7)
Neutrophils Relative %: 39 %
Platelet Count: 368 10*3/uL (ref 150–400)
RBC: 2.56 MIL/uL — ABNORMAL LOW (ref 4.22–5.81)
RDW: 20.9 % — ABNORMAL HIGH (ref 11.5–15.5)
WBC Count: 1.6 10*3/uL — ABNORMAL LOW (ref 4.0–10.5)
nRBC: 0 % (ref 0.0–0.2)

## 2022-06-16 MED ORDER — SODIUM CHLORIDE 0.9 % IV SOLN
800.0000 mg/m2 | Freq: Once | INTRAVENOUS | Status: AC
  Administered 2022-06-16: 1406 mg via INTRAVENOUS
  Filled 2022-06-16: qty 36.98

## 2022-06-16 MED ORDER — PALONOSETRON HCL INJECTION 0.25 MG/5ML
0.2500 mg | Freq: Once | INTRAVENOUS | Status: AC
  Administered 2022-06-16: 0.25 mg via INTRAVENOUS
  Filled 2022-06-16: qty 5

## 2022-06-16 MED ORDER — SODIUM CHLORIDE 0.9% FLUSH
10.0000 mL | INTRAVENOUS | Status: DC | PRN
  Administered 2022-06-16: 10 mL

## 2022-06-16 MED ORDER — SODIUM CHLORIDE 0.9 % IV SOLN: Freq: Once | INTRAVENOUS | Status: AC

## 2022-06-16 MED ORDER — HEPARIN SOD (PORK) LOCK FLUSH 100 UNIT/ML IV SOLN
500.0000 [IU] | Freq: Once | INTRAVENOUS | Status: AC | PRN
  Administered 2022-06-16: 500 [IU]

## 2022-06-16 MED ORDER — DEXAMETHASONE 4 MG PO TABS
4.0000 mg | ORAL_TABLET | Freq: Once | ORAL | Status: AC
  Administered 2022-06-16: 4 mg via ORAL
  Filled 2022-06-16: qty 1

## 2022-06-16 MED ORDER — PROCHLORPERAZINE MALEATE 10 MG PO TABS
10.0000 mg | ORAL_TABLET | Freq: Once | ORAL | Status: AC
  Administered 2022-06-16: 10 mg via ORAL
  Filled 2022-06-16: qty 1

## 2022-06-16 NOTE — Progress Notes (Signed)
Right upper extremity venous duplex has been completed. Preliminary results can be found in CV Proc through chart review.  Results were given to [facility] at Dr. Ernestina Penna office.  06/16/22 3:30 PM Carlos Levering RVT

## 2022-06-16 NOTE — Progress Notes (Signed)
Per Dr. Burr Medico okay to treat with ANC 0.6 and HGB 7.7

## 2022-06-16 NOTE — Patient Instructions (Signed)
[facility]ICAL [facility]COLOGY   Discharge Instructions: Thank you for choosing [facility] to provide your oncology and hematology care.   If you have a lab appointment with the Cancer Center, please go directly to the Cancer Center and check in at the registration area.   Wear comfortable clothing and clothing appropriate for easy access to any Portacath or PICC line.   We strive to give you quality time with your provider. You may need to reschedule your appointment if you arrive late (15 or more minutes).  Arriving late affects you and other patients whose appointments are after yours.  Also, if you miss three or more appointments without notifying the office, you may be dismissed from the clinic at the provider's discretion.      For prescription refill requests, have your pharmacy contact our office and allow 72 hours for refills to be completed.    Today you received the following chemotherapy and/or immunotherapy agents: gemcitabine      To help prevent nausea and vomiting after your treatment, we encourage you to take your nausea medication as directed.  BELOW ARE SYMPTOMS THAT SHOULD BE REPORTED IMMEDIATELY: *FEVER GREATER THAN 100.4 F (38 C) OR HIGHER *CHILLS OR SWEATING *NAUSEA AND VOMITING THAT IS NOT CONTROLLED WITH YOUR NAUSEA MEDICATION *UNUSUAL SHORTNESS OF BREATH *UNUSUAL BRUISING OR BLEEDING *URINARY PROBLEMS (pain or burning when urinating, or frequent urination) *BOWEL PROBLEMS (unusual diarrhea, constipation, pain near the anus) TENDERNESS IN MOUTH AND THROAT WITH OR WITHOUT PRESENCE OF ULCERS (sore throat, sores in mouth, or a toothache) UNUSUAL RASH, SWELLING OR PAIN  UNUSUAL VAGINAL DISCHARGE OR ITCHING   Items with * indicate a potential emergency and should be followed up as soon as possible or go to the Emergency Department if any problems should occur.  Please show the CHEMOTHERAPY ALERT CARD or IMMUNOTHERAPY ALERT CARD at check-in  to the Emergency Department and triage nurse.  Should you have questions after your visit or need to cancel or reschedule your appointment, please contact [facility] ONCOL[facility]  Dept: [phone]  and follow the prompts.  Office hours are 8:00 a.m. to 4:30 p.m. Monday - Friday. Please note that voicemails left after 4:00 p.m. may not be returned until the following business day.  We are closed weekends and major holidays. You have access to a nurse at all times for urgent questions. Please call the main number to the clinic Dept: [phone] and follow the prompts.   For any non-urgent questions, you may also contact your provider using MyChart. We now offer e-Visits for anyone 18 and older to request care online for non-urgent symptoms. For details visit [email or web address].   Also download the MyChart app! Go to the app store, search "MyChart", open the app, select [place], and log in with your MyChart username and password.  Masks are optional in the cancer centers. If you would like for your care team to wear a mask while they are taking care of you, please let them know. You may have one support Custer Pimenta who is at least 82 years old accompany you for your appointments. 

## 2022-06-17 ENCOUNTER — Inpatient Hospital Stay: Payer: Medicare HMO

## 2022-06-17 ENCOUNTER — Other Ambulatory Visit: Payer: Self-pay | Admitting: Hematology

## 2022-06-17 ENCOUNTER — Other Ambulatory Visit: Payer: Self-pay

## 2022-06-17 ENCOUNTER — Inpatient Hospital Stay: Payer: Medicare HMO | Admitting: Licensed Clinical Social Worker

## 2022-06-17 DIAGNOSIS — D649 Anemia, unspecified: Secondary | ICD-10-CM

## 2022-06-17 DIAGNOSIS — C251 Malignant neoplasm of body of pancreas: Secondary | ICD-10-CM

## 2022-06-17 NOTE — Progress Notes (Signed)
[facility] CSW Progress Note  Clinical Education officer, museum  received a message from [employer] requesting an updated confirmation from the oncologist that pt is still in active treatment as they were able to approve sending a check to [facility] for [employer].  Medical team informed and contacted [employer] to provide requested information.  CSW spoke w/ pt's daughter to inform of the above.  Pt completed treatment yesterday.  CSW to remain available as appropriate.        Henriette Combs, LCSW

## 2022-06-18 ENCOUNTER — Inpatient Hospital Stay: Payer: Medicare HMO

## 2022-06-18 VITALS — BP 145/71 | HR 60 | Temp 98.0°F | Resp 17

## 2022-06-18 DIAGNOSIS — D509 Iron deficiency anemia, unspecified: Secondary | ICD-10-CM | POA: Diagnosis not present

## 2022-06-18 DIAGNOSIS — Z5111 Encounter for antineoplastic chemotherapy: Secondary | ICD-10-CM | POA: Diagnosis not present

## 2022-06-18 DIAGNOSIS — C251 Malignant neoplasm of body of pancreas: Secondary | ICD-10-CM

## 2022-06-18 DIAGNOSIS — Z5189 Encounter for other specified aftercare: Secondary | ICD-10-CM | POA: Diagnosis not present

## 2022-06-18 DIAGNOSIS — C250 Malignant neoplasm of head of pancreas: Secondary | ICD-10-CM | POA: Diagnosis not present

## 2022-06-18 DIAGNOSIS — D649 Anemia, unspecified: Secondary | ICD-10-CM

## 2022-06-18 DIAGNOSIS — Z95828 Presence of other vascular implants and grafts: Secondary | ICD-10-CM

## 2022-06-18 DIAGNOSIS — M7989 Other specified soft tissue disorders: Secondary | ICD-10-CM | POA: Diagnosis not present

## 2022-06-18 LAB — PREPARE RBC (CROSSMATCH)

## 2022-06-18 MED ORDER — SODIUM CHLORIDE 0.9% IV SOLUTION: 250.0000 mL | Freq: Once | INTRAVENOUS | Status: DC

## 2022-06-18 MED ORDER — SODIUM CHLORIDE 0.9% IV SOLUTION
250.0000 mL | Freq: Once | INTRAVENOUS | Status: AC
  Administered 2022-06-18: 250 mL via INTRAVENOUS

## 2022-06-18 MED ORDER — DIPHENHYDRAMINE HCL 25 MG PO CAPS
25.0000 mg | ORAL_CAPSULE | Freq: Once | ORAL | Status: AC
  Administered 2022-06-18: 25 mg via ORAL
  Filled 2022-06-18: qty 1

## 2022-06-18 MED ORDER — HEPARIN SOD (PORK) LOCK FLUSH 100 UNIT/ML IV SOLN
500.0000 [IU] | Freq: Once | INTRAVENOUS | Status: AC | PRN
  Administered 2022-06-18: 500 [IU]

## 2022-06-18 MED ORDER — SODIUM CHLORIDE 0.9% FLUSH
10.0000 mL | INTRAVENOUS | Status: DC | PRN
  Administered 2022-06-18: 10 mL

## 2022-06-18 MED ORDER — ACETAMINOPHEN 325 MG PO TABS
650.0000 mg | ORAL_TABLET | Freq: Once | ORAL | Status: AC
  Administered 2022-06-18: 650 mg via ORAL
  Filled 2022-06-18: qty 2

## 2022-06-18 MED ORDER — PEGFILGRASTIM-CBQV 6 MG/0.6ML ~~LOC~~ SOSY
6.0000 mg | PREFILLED_SYRINGE | Freq: Once | SUBCUTANEOUS | Status: AC
  Administered 2022-06-18: 6 mg via SUBCUTANEOUS
  Filled 2022-06-18: qty 0.6

## 2022-06-18 NOTE — Patient Instructions (Signed)
Blood Transfusion, Adult, Care After The following information offers guidance on how to care for yourself after your procedure. Your health care provider may also give you more specific instructions. If you have problems or questions, contact your health care provider. What can I expect after the procedure? After the procedure, it is common to have: Bruising and soreness where the IV was inserted. A headache. Follow these instructions at home: IV insertion site care     Follow instructions from your health care provider about how to take care of your IV insertion site. Make sure you: Wash your hands with soap and water for at least 20 seconds before and after you change your bandage (dressing). If soap and water are not available, use hand sanitizer. Change your dressing as told by your health care provider. Check your IV insertion site every day for signs of infection. Check for: Redness, swelling, or pain. Bleeding from the site. Warmth. Pus or a bad smell. General instructions Take over-the-counter and prescription medicines only as told by your health care provider. Rest as told by your health care provider. Return to your normal activities as told by your health care provider. Keep all follow-up visits. Lab tests may need to be done at certain periods to recheck your blood counts. Contact a health care provider if: You have itching or red, swollen areas of skin (hives). You have a fever or chills. You have pain in the head, back, or chest. You feel anxious or you feel weak after doing your normal activities. You have redness, swelling, warmth, or pain around the IV insertion site. You have blood coming from the IV insertion site that does not stop with pressure. You have pus or a bad smell coming from your IV insertion site. If you received your blood transfusion in an outpatient setting, you will be told whom to contact to report any reactions. Get help right away if: You  have symptoms of a serious allergic or immune system reaction, including: Trouble breathing or shortness of breath. Swelling of the face, feeling flushed, or widespread rash. Dark urine or blood in the urine. Fast heartbeat. These symptoms may be an emergency. Get help right away. Call 911. Do not wait to see if the symptoms will go away. Do not drive yourself to the hospital. Summary Bruising and soreness around the IV insertion site are common. Check your IV insertion site every day for signs of infection. Rest as told by your health care provider. Return to your normal activities as told by your health care provider. Get help right away for symptoms of a serious allergic or immune system reaction to the blood transfusion. This information is not intended to replace advice given to you by your health care provider. Make sure you discuss any questions you have with your health care provider. Document Revised: 10/29/2021 Document Reviewed: 10/29/2021 Elsevier Patient Education  2023 Elsevier Inc.  

## 2022-06-20 LAB — TYPE AND SCREEN
ABO/RH(D): A POS
Antibody Screen: NEGATIVE
Unit division: 0

## 2022-06-20 LAB — BPAM RBC
Blood Product Expiration Date: 202311262359
ISSUE DATE / TIME: 202311041008
Unit Type and Rh: 6200

## 2022-06-21 ENCOUNTER — Other Ambulatory Visit: Payer: Self-pay

## 2022-06-21 ENCOUNTER — Telehealth: Payer: Self-pay

## 2022-06-21 NOTE — Telephone Encounter (Addendum)
Spoke with Lattie Haw, patient's daughter, to advise of negative doppler results. Patient's hands are still "puffy." Patient has appointment with cardiologist in the next week. No concerns voiced.   ---- Message from Truitt Merle, MD sent at 06/19/2022  1:20 PM EST ----- Please let pt or his daughter know the negative doppler result, thanks   Truitt Merle

## 2022-06-29 ENCOUNTER — Encounter (HOSPITAL_BASED_OUTPATIENT_CLINIC_OR_DEPARTMENT_OTHER): Payer: Self-pay | Admitting: Cardiology

## 2022-06-29 ENCOUNTER — Ambulatory Visit (HOSPITAL_BASED_OUTPATIENT_CLINIC_OR_DEPARTMENT_OTHER): Payer: Medicare HMO | Admitting: Cardiology

## 2022-06-29 VITALS — BP 142/82 | HR 70 | Ht 70.0 in | Wt 132.7 lb

## 2022-06-29 DIAGNOSIS — I480 Paroxysmal atrial fibrillation: Secondary | ICD-10-CM

## 2022-06-29 DIAGNOSIS — I700 Atherosclerosis of aorta: Secondary | ICD-10-CM | POA: Diagnosis not present

## 2022-06-29 DIAGNOSIS — C259 Malignant neoplasm of pancreas, unspecified: Secondary | ICD-10-CM

## 2022-06-29 DIAGNOSIS — R0602 Shortness of breath: Secondary | ICD-10-CM

## 2022-06-29 DIAGNOSIS — I10 Essential (primary) hypertension: Secondary | ICD-10-CM

## 2022-06-29 DIAGNOSIS — R601 Generalized edema: Secondary | ICD-10-CM

## 2022-06-29 NOTE — Progress Notes (Signed)
Cardiology Office Note:    Date:  06/29/2022   ID:  [name], DOB [birth year], MRN [id number]  PCP:  Ronnell Freshwater, NP  Cardiologist:  Buford Dresser, MD  Referring MD: Ronnell Freshwater, NP   CC: follow up  History of Present Illness:    Angel Keller. is a 81 y.o. male with a hx of hypertension, hyperlipidemia, pancreatic cancer, sleep apnea, and anxiety who is seen for follow up today. I initially met him 09/29/21 as a new consult at the request of Ronnell Freshwater, NP for the evaluation and management of paroxysmal atrial fibrillation in the setting of sepsis.  He was hospitalized 08/27/21 for fever. The fever started the evening after his chemotherapy. His daughter called 911 when he showed weakness and a temperature of 101. While in the ED, his blood pressure dropped to 80/40 and he required IV fluid. Although he had no source of infection, he was started on broad spectrum antibiotics for suspected sepsis. EKG at this time showed atrial fibrillation. He reported he was on Eliquis in the past but had not taken it in a few years. Blood and urine cultures were positive for streptococcus and infection. He was started on Eliquis and diltiazem and was discharged with amoxicillin.   At his last visit, he was having good and bad days His blood pressure was stable at home. No recurring episodes of Afib, but he did have increased heart rates with activity. He was no longer taking HCTZ.  Today, he is accompanied by his daughter. For the past 1.5 months he has developed significant swelling in his bilateral hands and feet. In the mornings his swelling has usually improved.  Since his Whipple procedure he has struggled with shortness of breath. He is no longer able to complete his long bicycle rides. Usually he would like to work out at the gym 3 times a week. Recently he did walk 7 acres without shortness of breath or anginal symptoms.  He also complains of lightheadedness,  which prevents him from driving.  In clinic today his BP is 160/80, but improved to 142/82 on recheck. At home his blood pressures have been more controlled. He monitors this twice a day. Of note he is also taking 81 mg ASA. He denies taking iron supplements.  Occasionally he may have indigestion but he denies any chest pain from a cardiovascular perspective. When he becomes upset he will be aware of racing heart beats.  He denies any palpitations, headaches, syncope, orthopnea, or PND.  About 2 weeks ago he received his final chemotherapy treatment. He attributes a weight loss of 30 lbs to his cancer.   Past Medical History:  Diagnosis Date   Aneurysm ([place])    2017   Anxiety    Arthritis    Depression    Family history of breast cancer    Family history of lung cancer    Headache    HLD (hyperlipidemia)    Hx of inguinal hernia repair    Hypertension    Hypothyroidism    Overactive bladder    Pancreatic cancer (HCC)    Pneumonia    Shingles    Sleep apnea    Patient denies   Thoracic ascending aortic aneurysm ([place])    mildly dilated 4.0cm per 08/12/21 CT   Thyroid disease    Varicose veins of both lower extremities     Past Surgical History:  Procedure Laterality Date   BILATERAL CARPAL TUNNEL RELEASE  CATARACT EXTRACTION, BILATERAL Bilateral    ELBOW SURGERY Bilateral    ENDOSCOPIC RETROGRADE CHOLANGIOPANCREATOGRAPHY (ERCP) WITH PROPOFOL N/A 07/20/2021   Procedure: ENDOSCOPIC RETROGRADE CHOLANGIOPANCREATOGRAPHY (ERCP) WITH PROPOFOL;  Surgeon: Gatha Mayer, MD;  Location: [place];  Service: Endoscopy;  Laterality: N/A;   ESOPHAGOGASTRODUODENOSCOPY (EGD) WITH PROPOFOL N/A 08/05/2021   Procedure: ESOPHAGOGASTRODUODENOSCOPY (EGD) WITH PROPOFOL;  Surgeon: Rush Landmark Telford Nab., MD;  Location: [facility];  Service: Gastroenterology;  Laterality: N/A;   EUS N/A 08/05/2021   Procedure: UPPER ENDOSCOPIC ULTRASOUND (EUS) RADIAL;  Surgeon: Irving Copas.,  MD;  Location: [facility];  Service: Gastroenterology;  Laterality: N/A;   EYE SURGERY     bilateral cataracts   FINE NEEDLE ASPIRATION  08/05/2021   Procedure: FINE NEEDLE ASPIRATION (FNA) LINEAR;  Surgeon: Irving Copas., MD;  Location: [place];  Service: Gastroenterology;;   INGUINAL HERNIA REPAIR Right 1951   IR ANGIO INTRA EXTRACRAN SEL COM CAROTID INNOMINATE BILAT MOD SED  02/20/2019   IR BILIARY STENT(S) EXISTING ACCESS INC DILATION CATH EXCHANGE  09/20/2021   IR ENDOLUMINAL BX OF BILIARY TREE  07/22/2021   IR GENERIC HISTORICAL  10/28/2016   IR RADIOLOGIST EVAL & MGMT 10/28/2016 [facility]   IR INT EXT BILIARY DRAIN WITH CHOLANGIOGRAM  07/22/2021   JOINT REPLACEMENT     LAPAROSCOPY N/A 11/15/2021   Procedure: LAPAROSCOPIC DIAGNOSTIC STAGING;  Surgeon: Dwan Bolt, MD;  Location: [facility];  Service: General;  Laterality: N/A;   PORTACATH PLACEMENT N/A 08/19/2021   Procedure: INSERTION PORT-A-CATH;  Surgeon: Dwan Bolt, MD;  Location: [facility];  Service: General;  Laterality: N/A;  LMA   RADIOLOGY WITH ANESTHESIA N/A 10/21/2015   Procedure: RADIOLOGY WITH ANESTHESIA;  Surgeon: Luanne Bras, MD;  Location: [place];  Service: Radiology;  Laterality: N/A;   RADIOLOGY WITH ANESTHESIA N/A 02/20/2019   Procedure: [employer];  Surgeon: Luanne Bras, MD;  Location: [facility];  Service: Radiology;  Laterality: N/A;   ROTATOR CUFF REPAIR Bilateral    VARICOSE VEIN SURGERY     WHIPPLE PROCEDURE N/A 11/15/2021   Procedure: WHIPPLE PROCEDURE;  Surgeon: Dwan Bolt, MD;  Location: [facility];  Service: General;  Laterality: N/A;    Current Medications: Current Outpatient Medications on File Prior to Visit  Medication Sig   acetaminophen (TYLENOL) 500 MG tablet Take 1 tablet (500 mg total) by mouth every 6 (six) hours as needed for mild pain.   Cholecalciferol (VITAMIN D3) 50 MCG (2000 UT) TABS Take 2,000 Units by mouth daily.   dexamethasone (DECADRON) 4 MG tablet Take  8 mg by mouth daily.   escitalopram (LEXAPRO) 20 MG tablet Take 1 tablet (20 mg total) by mouth daily.   famotidine (PEPCID) 20 MG tablet Take 1 tablet (20 mg total) by mouth 2 (two) times daily.   levothyroxine (SYNTHROID) 112 MCG tablet TAKE 1 TABLET (112 MCG TOTAL) BY MOUTH DAILY BEFORE BREAKFAST.   lipase/protease/amylase (CREON) 36000 UNITS CPEP capsule Take 1 capsule (36,000 Units total) by mouth 3 (three) times daily before meals.   lovastatin (MEVACOR) 20 MG tablet Take 1 tablet (20 mg total) by mouth at bedtime.   mirtazapine (REMERON) 7.5 MG tablet Take 1 tablet (7.5 mg total) by mouth at bedtime.   Multiple Vitamin (MULTIVITAMIN WITH MINERALS) TABS tablet Take 1 tablet by mouth daily.   nystatin-diphenhydrAMINE-alum & mag hydroxide-simeth Take 5 MLs by mouth 3 (three) times daily as needed for mouth pain.   pantoprazole (PROTONIX) 40 MG tablet Take 1 tablet by  mouth daily.   prochlorperazine (COMPAZINE) 10 MG tablet Take 1 tablet (10 mg total) by mouth every 6 (six) hours as needed (Nausea or vomiting).   vitamin B-12 (CYANOCOBALAMIN) 1000 MCG tablet Take 1,000 mcg by mouth 2 (two) times daily.   docusate sodium (COLACE) 100 MG capsule Take 1 capsule (100 mg total) by mouth 2 (two) times daily. (Patient not taking: Reported on 06/29/2022)   ferrous sulfate 325 (65 FE) MG EC tablet Take 1 tablet (325 mg total) by mouth daily with breakfast. (Patient not taking: Reported on 06/29/2022)   polyethylene glycol (MIRALAX / GLYCOLAX) 17 g packet Take 17 g by mouth daily as needed for moderate constipation. (Patient not taking: Reported on 06/29/2022)   No current facility-administered medications on file prior to visit.     Allergies:   Patient has no known allergies.   Social History   Tobacco Use   Smoking status: Former    Packs/day: 0.25    Years: 1.00    Total pack years: 0.25    Types: Cigarettes    Quit date: 08/15/1961    Years since quitting: 60.9   Smokeless tobacco: Never   Vaping Use   Vaping Use: Never used  Substance Use Topics   Alcohol use: No   Drug use: No    Family History: family history includes Alcoholism in his sister; Breast cancer in his mother; Cirrhosis in his sister; Lung cancer in his father. There is no history of Colon cancer, Esophageal cancer, Liver cancer, Pancreatic cancer, or Prostate cancer.  ROS:   Please see the history of present illness.   (+) Peripheral edema of bilateral hands and feet (+) Shortness of breath (+) Lightheadedness Additional pertinent ROS otherwise unremarkable.  EKGs/Labs/Other Studies Reviewed:    The following studies were reviewed today:  Right UE Venous Doppler 06/16/2022: Summary:    Right:  No evidence of deep vein thrombosis in the upper extremity. No evidence of  superficial vein thrombosis in the upper extremity.    Left:  No evidence of thrombosis in the subclavian.   CT Chest/Abdomen/Pelvis  05/03/2022: IMPRESSION: 1. Interval Whipple procedure, without findings of residual/recurrent or metastatic disease. 2. Esophageal air fluid level suggests dysmotility or gastroesophageal reflux. 3. Coronary artery atherosclerosis. Aortic Atherosclerosis (ICD10-I70.0). 4.  Possible constipation.  Echo 08/28/21  1. Left ventricular ejection fraction, by estimation, is 60 to 65%. The  left ventricle has normal function. The left ventricle has no regional  wall motion abnormalities. Left ventricular diastolic parameters are  consistent with Grade I diastolic  dysfunction (impaired relaxation).   2. Right ventricular systolic function is normal. The right ventricular  size is normal. There is normal pulmonary artery systolic pressure.   3. Left atrial size was mildly dilated.   4. Right atrial size was mildly dilated.   5. The mitral valve is grossly normal. Trivial mitral valve  regurgitation.   6. The aortic valve is tricuspid. There is mild calcification of the  aortic valve. There is mild  thickening of the aortic valve. Aortic valve  regurgitation is not visualized. Aortic valve sclerosis/calcification is  present, without any evidence of  aortic stenosis.   7. The inferior vena cava is normal in size with greater than 50%  respiratory variability, suggesting right atrial pressure of 3 mmHg.   Comparison(s): No prior Echocardiogram.  Lower Venous DVT 07/28/20 RIGHT:  - Findings consistent with age indeterminate deep vein thrombosis  involving the right popliteal vein.  - No cystic structure  found in the popliteal fossa.     LEFT:  - There is no evidence of deep vein thrombosis in the lower extremity.  - No cystic structure found in the popliteal fossa.  EKG:  EKG is personally reviewed.   06/29/2022:  NSR at 70 bpm 09/29/21: sinus bradycardia at 55 bpm  Recent Labs: 07/27/2021: TSH 4.060 08/28/2021: Magnesium 2.1 06/16/2022: ALT 14; BUN 21; Creatinine 1.24; Hemoglobin 7.7; Platelet Count 368; Potassium 4.1; Sodium 136  Recent Lipid Panel    Component Value Date/Time   CHOL (H) 07/10/2009 0540    257        ATP III CLASSIFICATION:  <200     mg/dL   Desirable  200-239  mg/dL   Borderline High  >=240    mg/dL   High          TRIG 125 07/27/2020 1730   TRIG 150 (H) 07/11/2006 0731   HDL 44 07/10/2009 0540   CHOLHDL 5.8 07/10/2009 0540   VLDL 53 (H) 07/10/2009 0540   LDLCALC (H) 07/10/2009 0540    160        Total Cholesterol/HDL:CHD Risk Coronary Heart Disease Risk Table                     Men   Women  1/2 Average Risk   3.4   3.3  Average Risk       5.0   4.4  2 X Average Risk   9.6   7.1  3 X Average Risk  23.4   11.0        Use the calculated Patient Ratio above and the CHD Risk Table to determine the patient's CHD Risk.        ATP III CLASSIFICATION (LDL):  <100     mg/dL   Optimal  100-129  mg/dL   Near or Above                    Optimal  130-159  mg/dL   Borderline  160-189  mg/dL   High  >190     mg/dL   Very High   LDLDIRECT 149.7  11/07/2008 0000    Physical Exam:    VS:  BP (!) 142/82 (BP Location: Right Arm, Patient Position: Sitting, Cuff Size: Normal)   Pulse 70   Ht _0  (1.778 m)   Wt 132 lb 11.2 oz (60.2 kg)   BMI 19.04 kg/m     Wt Readings from Last 3 Encounters:  06/29/22 132 lb 11.2 oz (60.2 kg)  06/16/22 129 lb 12.8 oz (58.9 kg)  06/09/22 126 lb 8 oz (57.4 kg)    GEN: Frail appearing, thin elderly gentleman with temporal muscle wasting, in no acute distress HEENT: Normal, moist mucous membranes NECK: No JVD CARDIAC: regular rhythm, normal S1 and S2, no rubs or gallops. No murmur. VASCULAR: Radial and DP pulses 2+ bilaterally. No carotid bruits RESPIRATORY:  Clear to auscultation without rales, wheezing or rhonchi  ABDOMEN: Soft, non-tender, non-distended MUSCULOSKELETAL:  Ambulates independently SKIN: Warm and dry, diffuse nonpitting UE edema to the hands R>L, trivial bilateral LE LE edema NEUROLOGIC:  Alert and oriented x 3. No focal neuro deficits noted. PSYCHIATRIC:  Normal affect    ASSESSMENT:    1. Generalized edema   2. Shortness of breath   3. Paroxysmal atrial fibrillation (HCC)   4. Pancreatic adenocarcinoma ([place])   5. Aortic atherosclerosis ([place])   6. Essential hypertension  PLAN:    Generalized edema, shortness of breath -no JVD, nonpitting, no rales on exam -his albumin is 3.2, low but not so low I would expect significant edema -will recheck echo  Paroxysmal atrial fibrillation, in the setting of sepsis/bacteremia -has not been told previously that he has any rhythm issues -no longer on apixaban, unclear to me when this was stopped but as he has required blood transfusion, this is reasonable -he is on aspirin, discussed this does not prevent stroke in afib, with anemia recommended stopping -CHA2DS2/VAS Stroke Risk Points= 3  -given that he has only had afib in the setting of sepsis, would be reasonable to allow him to continue to heal, then recheck monitor to  see if he has any afib. If none, would consider not restarting anticoagulation. Will discuss again at follow up  Hypertension -slightly elevated today but typically well controlled. Currently on no medication. 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 Just completed his last chemo treatment  Aortic atherosclerosis: seen on CT -on lovastatin -stop aspirin given anemia  Plan for follow up: 6 months or sooner as needed  Buford Dresser, MD, PhD, [facility]    Medication Adjustments/Labs and Tests Ordered: Current medicines are reviewed at length with the patient today.  Concerns regarding medicines are outlined above.   Orders Placed This Encounter  Procedures   EKG 12-Lead   ECHOCARDIOGRAM COMPLETE   No orders of the defined types were placed in this encounter.  Patient Instructions  Medication Instructions:  Continue current medications  *If you need a refill on your cardiac medications before your next appointment, please call your pharmacy*   Lab Work: None Ordered   Testing/Procedures: Your physician has requested that you have an echocardiogram. Echocardiography is a painless test that uses sound waves to create images of your heart. It provides your doctor with information about the size and shape of your heart and how well your heart's chambers and valves are working. This procedure takes approximately one hour. There are no restrictions for this procedure. Please do NOT wear cologne, perfume, aftershave, or lotions (deodorant is allowed). Please arrive 15 minutes prior to your appointment time.   Follow-Up: At [facility], you and your health needs are our priority.  As part of our continuing mission to provide you with exceptional heart care, we have created designated Provider Care Teams.  These Care Teams include your primary Cardiologist  (physician) and Advanced Practice Providers (APPs -  Physician Assistants and Nurse Practitioners) who all work together to provide you with the care you need, when you need it.  We recommend signing up for the patient portal called "MyChart".  Sign up information is provided on this After Visit Summary.  MyChart is used to connect with patients for Virtual Visits (Telemedicine).  Patients are able to view lab/test results, encounter notes, upcoming appointments, etc.  Non-urgent messages can be sent to your provider as well.   To learn more about what you can do with MyChart, go to [email or web address].    Your next appointment:   6 month(s)  The format for your next appointment:   In Person  Provider:   Buford Dresser, MD    Other Instructions            I,Mitra Faeizi,acting as a scribe for Buford Dresser, MD.,have documented all relevant documentation on the behalf of Buford Dresser,  MD,as directed by  Buford Dresser, MD while in the presence of Buford Dresser, MD.  I, Buford Dresser, MD, have reviewed all documentation for this visit. The documentation on 06/29/22 for the exam, diagnosis, procedures, and orders are all accurate and complete.   Signed, Buford Dresser, MD PhD 06/29/2022     [facility]

## 2022-06-29 NOTE — Patient Instructions (Signed)
Medication Instructions:  Continue current medications  *If you need a refill on your cardiac medications before your next appointment, please call your pharmacy*   Lab Work: None Ordered   Testing/Procedures: Your physician has requested that you have an echocardiogram. Echocardiography is a painless test that uses sound waves to create images of your heart. It provides your doctor with information about the size and shape of your heart and how well your heart's chambers and valves are working. This procedure takes approximately one hour. There are no restrictions for this procedure. Please do NOT wear cologne, perfume, aftershave, or lotions (deodorant is allowed). Please arrive 15 minutes prior to your appointment time.   Follow-Up: At [facility], you and your health needs are our priority.  As part of our continuing mission to provide you with exceptional heart care, we have created designated Provider Care Teams.  These Care Teams include your primary Cardiologist (physician) and Advanced Practice Providers (APPs -  Physician Assistants and Nurse Practitioners) who all work together to provide you with the care you need, when you need it.  We recommend signing up for the patient portal called "MyChart".  Sign up information is provided on this After Visit Summary.  MyChart is used to connect with patients for Virtual Visits (Telemedicine).  Patients are able to view lab/test results, encounter notes, upcoming appointments, etc.  Non-urgent messages can be sent to your provider as well.   To learn more about what you can do with MyChart, go to [email or web address].    Your next appointment:   6 month(s)  The format for your next appointment:   In Person  Provider:   Buford Dresser, MD    Other Instructions

## 2022-06-30 ENCOUNTER — Other Ambulatory Visit: Payer: Self-pay

## 2022-07-14 NOTE — Progress Notes (Signed)
Angel Keller   [phone] [phone]   Clinic Follow up Note   Patient Care Team: Ronnell Freshwater, NP as PCP - General (Family Medicine) Buford Dresser, MD as PCP - Cardiology (Cardiology) Truitt Merle, MD as Consulting Physician (Oncology) Royston Bake, RN as Oncology Nurse Navigator (Oncology)  Date of Service:  07/15/2022  CHIEF COMPLAINT: f/u of  pancreatic cancer   CURRENT THERAPY:  Gemcitabine, days 1 and 8 q21d, starting 01/20/22   ASSESSMENT:  Angel Burridge. is a 81 y.o. 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 Abraxane was added with cycle 2 (09/17/21), but he tolerated poorly and discontinued after first dose.  --s/p whipple procedure on 11/15/21 with Dr. Zenia Resides. 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 Margins and lymph nodes negative. -he restarted adjuvant gemcitabine on 01/20/22, completed 06/18/2022  Thrombocytopenia ([place]) -new today -Unusual since his last chemotherapy was a month ago -Will monitor closely, he is not on any blood thinner, no active bleeding     PLAN: -Lab reviewed Platelet count low -Lab 12/17, -f/u in 3wks with lab, CT scan for restaging a few days before  SUMMARY OF ONCOLOGIC HISTORY: Oncology History Overview Note   Cancer Staging  Pancreatic cancer [facility]) Staging form: Exocrine Pancreas, AJCC 8th Edition - Clinical stage from 08/05/2021: Stage IB (cT2, cN0, cM0) - Signed by Truitt Merle, MD on 08/17/2021 Stage prefix: Initial diagnosis Total positive nodes: 0     Pancreatic mass  07/18/2021 Initial Diagnosis   Pancreatic mass   07/18/2021 Imaging   CT Abdomen Pelvis W Contrast  IMPRESSION: 1. Suspected solid mass within the proximal body of the pancreas measures 2.3 x 2.6 x 2.0 cm. There is downstream dilation of the main pancreatic duct  and its branches. There is also intra and extrahepatic biliary ductal dilation. Further evaluation with MRI of the abdomen, pancreatic protocol, when clinically feasible, may be considered. 2. 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 3. Aortic atherosclerosis   07/20/2021 Procedure   DG ERCP  IMPRESSION: Nondiagnostic ERCP as above. Correlation with the operative report is advised.  - The examination was suspicious for a biliary stricture in the bile duct. - Examination was suspicious for carcinoma of the head of the pancreas. - Attempts at a cholangiogram failed.   07/22/2021 Pathology Results   CASE: [id number]   FINAL MICROSCOPIC DIAGNOSIS:  - Suspicious for malignancy  - See comment   DIAGNOSTIC COMMENTS:  There are rare cells with cytologic atypia suspicious for  adenocarcinoma.  Dr. Vic Ripper agrees.    07/22/2021 Procedure   IR INT EXT BILIARY DRAIN WITH CHOLANGIOGRAM ( IMPRESSION: 1. 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 2. Successful brush biopsy of obstructing lesion x3. 3. Successful placement of a 10 French internal/external biliary drainage catheter.   PLAN: 1. Follow bilirubin. When significant downward trend is clear, the bag should be capped. Recommend capping bag before discharge home if possible. 2. Follow-up in IR in 4-6 weeks for initial biliary tube check and exchange. If initial brush biopsies are negative, repeat biopsy could be considered at that time.   Pancreatic cancer ([place])  08/05/2021 Cancer Staging   Staging form: Exocrine Pancreas, AJCC 8th Edition - Clinical stage from 08/05/2021: Stage IB (cT2, cN0, cM0) - Signed by Truitt Merle, MD on 08/17/2021 Stage prefix: Initial diagnosis Total  positive nodes: 0   08/14/2021 Initial Diagnosis   Pancreatic cancer ([place])   08/20/2021 - 03/23/2022 Chemotherapy   Patient is on Treatment Plan : PANCREATIC Abraxane /  Gemcitabine D1,8 q21d     08/20/2021 -  Chemotherapy   Patient is on Treatment Plan : PANCREAS Gemcitabine D1,8 (1000) q21d x 8 Cycles     11/15/2021 Cancer Staging   Staging form: Exocrine Pancreas, AJCC 8th Edition - Pathologic stage from 11/15/2021: Stage IB (ypT2, pN0, cM0) - Signed by Truitt Merle, MD on 01/03/2022 Stage prefix: Post-therapy Total positive nodes: 0 Histologic grade (G): G3 Histologic grading system: 3 grade system Residual tumor (R): R0 - None      INTERVAL HISTORY:  Angel TREANOR Sr. is here for a follow up of  pancreatic cancer He was last seen by me on 06/09/2022 He presents to the clinic accompanied by daughter. Pt states he's frustrated today. Pt reports he feels anxious."Patience is not one of his virtues". Pt denise having pain. Pt said he has no endurance left.      All other systems were reviewed with the patient and are negative.  MEDICAL HISTORY:  Past Medical History:  Diagnosis Date   Aneurysm ([place])    2017   Anxiety    Arthritis    Depression    Family history of breast cancer    Family history of lung cancer    Headache    HLD (hyperlipidemia)    Hx of inguinal hernia repair    Hypertension    Hypothyroidism    Overactive bladder    Pancreatic cancer ([facility])    Pneumonia    Shingles    Sleep apnea    Patient denies   Thoracic ascending aortic aneurysm ([place])    mildly dilated 4.0cm per 08/12/21 CT   Thyroid disease    Varicose veins of both lower extremities     SURGICAL HISTORY: Past Surgical History:  Procedure Laterality Date   BILATERAL CARPAL TUNNEL RELEASE     CATARACT EXTRACTION, BILATERAL Bilateral    ELBOW SURGERY Bilateral    ENDOSCOPIC RETROGRADE CHOLANGIOPANCREATOGRAPHY (ERCP) WITH PROPOFOL N/A 07/20/2021   Procedure: ENDOSCOPIC RETROGRADE CHOLANGIOPANCREATOGRAPHY (ERCP) WITH PROPOFOL;  Surgeon: Gatha Mayer, MD;  Location: [facility] ENDOSCOPY;  Service: Endoscopy;  Laterality: N/A;   ESOPHAGOGASTRODUODENOSCOPY (EGD) WITH  PROPOFOL N/A 08/05/2021   Procedure: ESOPHAGOGASTRODUODENOSCOPY (EGD) WITH PROPOFOL;  Surgeon: Rush Landmark Telford Nab., MD;  Location: [place];  Service: Gastroenterology;  Laterality: N/A;   EUS N/A 08/05/2021   Procedure: UPPER ENDOSCOPIC ULTRASOUND (EUS) RADIAL;  Surgeon: Irving Copas., MD;  Location: [place];  Service: Gastroenterology;  Laterality: N/A;   EYE SURGERY     bilateral cataracts   FINE NEEDLE ASPIRATION  08/05/2021   Procedure: FINE NEEDLE ASPIRATION (FNA) LINEAR;  Surgeon: Irving Copas., MD;  Location: [place];  Service: Gastroenterology;;   INGUINAL HERNIA REPAIR Right 1951   IR ANGIO INTRA EXTRACRAN SEL COM CAROTID INNOMINATE BILAT MOD SED  02/20/2019   IR BILIARY STENT(S) EXISTING ACCESS INC DILATION CATH EXCHANGE  09/20/2021   IR ENDOLUMINAL BX OF BILIARY TREE  07/22/2021   IR GENERIC HISTORICAL  10/28/2016   IR RADIOLOGIST EVAL & MGMT 10/28/2016 [facility]   IR INT EXT BILIARY DRAIN WITH CHOLANGIOGRAM  07/22/2021   JOINT REPLACEMENT     LAPAROSCOPY N/A 11/15/2021   Procedure: LAPAROSCOPIC DIAGNOSTIC STAGING;  Surgeon: Dwan Bolt, MD;  Location: [place];  Service: General;  Laterality: N/A;  PORTACATH PLACEMENT N/A 08/19/2021   Procedure: INSERTION PORT-A-CATH;  Surgeon: Dwan Bolt, MD;  Location: [facility];  Service: General;  Laterality: N/A;  LMA   RADIOLOGY WITH ANESTHESIA N/A 10/21/2015   Procedure: RADIOLOGY WITH ANESTHESIA;  Surgeon: Luanne Bras, MD;  Location: [place];  Service: Radiology;  Laterality: N/A;   RADIOLOGY WITH ANESTHESIA N/A 02/20/2019   Procedure: [employer];  Surgeon: Luanne Bras, MD;  Location: [place];  Service: Radiology;  Laterality: N/A;   ROTATOR CUFF REPAIR Bilateral    VARICOSE VEIN SURGERY     WHIPPLE PROCEDURE N/A 11/15/2021   Procedure: WHIPPLE PROCEDURE;  Surgeon: Dwan Bolt, MD;  Location: [place];  Service: General;  Laterality: N/A;    I have reviewed the social history and  family history with the patient and they are unchanged from previous note.  ALLERGIES:  has No Known Allergies.  MEDICATIONS:  Current Outpatient Medications  Medication Sig Dispense Refill   acetaminophen (TYLENOL) 500 MG tablet Take 1 tablet (500 mg total) by mouth every 6 (six) hours as needed for mild pain. 30 tablet 0   Cholecalciferol (VITAMIN D3) 50 MCG (2000 UT) TABS Take 2,000 Units by mouth daily.     dexamethasone (DECADRON) 4 MG tablet Take 8 mg by mouth daily.     docusate sodium (COLACE) 100 MG capsule Take 1 capsule (100 mg total) by mouth 2 (two) times daily. (Patient not taking: Reported on 06/29/2022) 28 capsule 0   escitalopram (LEXAPRO) 20 MG tablet Take 1 tablet (20 mg total) by mouth daily. 90 tablet 1   famotidine (PEPCID) 20 MG tablet Take 1 tablet (20 mg total) by mouth 2 (two) times daily. 60 tablet 1   ferrous sulfate 325 (65 FE) MG EC tablet Take 1 tablet (325 mg total) by mouth daily with breakfast. (Patient not taking: Reported on 06/29/2022) 30 tablet 3   levothyroxine (SYNTHROID) 112 MCG tablet TAKE 1 TABLET (112 MCG TOTAL) BY MOUTH DAILY BEFORE BREAKFAST. 90 tablet 1   lipase/protease/amylase (CREON) 36000 UNITS CPEP capsule Take 1 capsule (36,000 Units total) by mouth 3 (three) times daily before meals. 180 capsule 0   lovastatin (MEVACOR) 20 MG tablet Take 1 tablet (20 mg total) by mouth at bedtime. 90 tablet 0   mirtazapine (REMERON) 7.5 MG tablet Take 1 tablet (7.5 mg total) by mouth at bedtime. 90 tablet 0   Multiple Vitamin (MULTIVITAMIN WITH MINERALS) TABS tablet Take 1 tablet by mouth daily. 30 tablet 0   nystatin-diphenhydrAMINE-alum & mag hydroxide-simeth Take 5 MLs by mouth 3 (three) times daily as needed for mouth pain. 120 mL 1   pantoprazole (PROTONIX) 40 MG tablet Take 1 tablet by mouth daily. 90 tablet 0   polyethylene glycol (MIRALAX / GLYCOLAX) 17 g packet Take 17 g by mouth daily as needed for moderate constipation. (Patient not taking:  Reported on 06/29/2022) 14 each 0   prochlorperazine (COMPAZINE) 10 MG tablet Take 1 tablet (10 mg total) by mouth every 6 (six) hours as needed (Nausea or vomiting). 30 tablet 1   vitamin B-12 (CYANOCOBALAMIN) 1000 MCG tablet Take 1,000 mcg by mouth 2 (two) times daily.     No current facility-administered medications for this visit.    PHYSICAL EXAMINATION: ECOG PERFORMANCE STATUS: 1 - Symptomatic but completely ambulatory  Vitals:   07/15/22 1109  BP: (!) 190/90  Pulse: 66  Resp: 18  Temp: 98.7 F (37.1 C)  SpO2: 99%   Wt Readings from Last 3 Encounters:  07/15/22 128 lb 8 oz (58.3 kg)  06/29/22 132 lb 11.2 oz (60.2 kg)  06/16/22 129 lb 12.8 oz (58.9 kg)    GENERAL:alert, no distress and comfortable SKIN: skin color, texture, turgor are normal, no rashes or significant lesions EYES: normal, Conjunctiva are pink and non-injected, sclera clear NECK: supple, thyroid normal size, non-tender, without nodularity LYMPH:  no palpable lymphadenopathy in the cervical, axillary  LUNGS: clear to auscultation and percussion with normal breathing effort HEART: regular rate & rhythm and no murmurs and lower extremity edema in both legs ABDOMEN:abdomen soft, non-tender and normal bowel sounds(-) Musculoskeletal:no cyanosis of digits and no clubbing  NEURO: alert & oriented x 3 with fluent speech, no focal motor/sensory deficits  LABORATORY DATA:  I have reviewed the data as listed    Latest Ref Rng & Units 07/15/2022   10:52 AM 06/16/2022   12:29 PM 06/09/2022   11:37 AM  CBC  WBC 4.0 - 10.5 K/uL 4.6  1.6  4.2   Hemoglobin 13.0 - 17.0 g/dL 8.7  7.7  8.1   Hematocrit 39.0 - 52.0 % 26.7  23.1  24.8   Platelets 150 - 400 K/uL 58  368  284         Latest Ref Rng & Units 07/15/2022   10:52 AM 06/16/2022   12:29 PM 06/09/2022   11:37 AM  CMP  Glucose 70 - 99 mg/dL 91  110  104   BUN 8 - 23 mg/dL '14  21  17   '$ Creatinine 0.61 - 1.24 mg/dL 1.03  1.24  1.31   Sodium 135 - 145 mmol/L  139  136  138   Potassium 3.5 - 5.1 mmol/L 3.8  4.1  4.3   Chloride 98 - 111 mmol/L 107  102  104   CO2 22 - 32 mmol/L '29  29  29   '$ Calcium 8.9 - 10.3 mg/dL 8.5  8.4  8.5   Total Protein 6.5 - 8.1 g/dL 5.5  5.6  5.8   Total Bilirubin 0.3 - 1.2 mg/dL 0.7  0.6  0.8   Alkaline Phos 38 - 126 U/L 99  69  90   AST 15 - 41 U/L '31  27  23   '$ ALT 0 - 44 U/L '13  14  11       '$ RADIOGRAPHIC STUDIES: I have personally reviewed the radiological images as listed and agreed with the findings in the report. No results found.    Orders Placed This Encounter  Procedures   CT CHEST ABDOMEN PELVIS W CONTRAST    Standing Status:   Future    Standing Expiration Date:   07/16/2023    Order Specific Question:   Preferred imaging location?    Answer:   [facility]    Order Specific Question:   Release to patient    Answer:   Immediate    Order Specific Question:   Is Oral Contrast requested for this exam?    Answer:   Yes, Per Radiology protocol   All questions were answered. The patient knows to call the clinic with any problems, questions or concerns. No barriers to learning was detected. The total time spent in the appointment was 30 minutes.     Truitt Merle, MD 07/15/2022   Felicity Coyer, CMA, am acting as scribe for Truitt Merle, MD.   I have reviewed the above documentation for accuracy and completeness, and I agree with the above.

## 2022-07-15 ENCOUNTER — Ambulatory Visit (HOSPITAL_BASED_OUTPATIENT_CLINIC_OR_DEPARTMENT_OTHER): Payer: Medicare HMO

## 2022-07-15 ENCOUNTER — Inpatient Hospital Stay: Payer: Medicare HMO | Attending: Physician Assistant

## 2022-07-15 ENCOUNTER — Inpatient Hospital Stay (HOSPITAL_BASED_OUTPATIENT_CLINIC_OR_DEPARTMENT_OTHER): Payer: Medicare HMO | Admitting: Hematology

## 2022-07-15 ENCOUNTER — Encounter: Payer: Self-pay | Admitting: Hematology

## 2022-07-15 VITALS — BP 190/90 | HR 66 | Temp 98.7°F | Resp 18 | Wt 128.5 lb

## 2022-07-15 DIAGNOSIS — C250 Malignant neoplasm of head of pancreas: Secondary | ICD-10-CM

## 2022-07-15 DIAGNOSIS — C251 Malignant neoplasm of body of pancreas: Secondary | ICD-10-CM | POA: Diagnosis not present

## 2022-07-15 DIAGNOSIS — D469 Myelodysplastic syndrome, unspecified: Secondary | ICD-10-CM | POA: Insufficient documentation

## 2022-07-15 DIAGNOSIS — D696 Thrombocytopenia, unspecified: Secondary | ICD-10-CM | POA: Insufficient documentation

## 2022-07-15 DIAGNOSIS — Z95828 Presence of other vascular implants and grafts: Secondary | ICD-10-CM

## 2022-07-15 LAB — CBC WITH DIFFERENTIAL/PLATELET
Abs Immature Granulocytes: 0 10*3/uL (ref 0.00–0.07)
Basophils Absolute: 0 10*3/uL (ref 0.0–0.1)
Basophils Relative: 1 %
Eosinophils Absolute: 0 10*3/uL (ref 0.0–0.5)
Eosinophils Relative: 1 %
HCT: 26.7 % — ABNORMAL LOW (ref 39.0–52.0)
Hemoglobin: 8.7 g/dL — ABNORMAL LOW (ref 13.0–17.0)
Immature Granulocytes: 0 %
Lymphocytes Relative: 22 %
Lymphs Abs: 1 10*3/uL (ref 0.7–4.0)
MCH: 30.6 pg (ref 26.0–34.0)
MCHC: 32.6 g/dL (ref 30.0–36.0)
MCV: 94 fL (ref 80.0–100.0)
Monocytes Absolute: 0.6 10*3/uL (ref 0.1–1.0)
Monocytes Relative: 13 %
Neutro Abs: 2.9 10*3/uL (ref 1.7–7.7)
Neutrophils Relative %: 63 %
Platelets: 58 10*3/uL — ABNORMAL LOW (ref 150–400)
RBC: 2.84 MIL/uL — ABNORMAL LOW (ref 4.22–5.81)
RDW: 21.9 % — ABNORMAL HIGH (ref 11.5–15.5)
WBC: 4.6 10*3/uL (ref 4.0–10.5)
nRBC: 0 % (ref 0.0–0.2)

## 2022-07-15 LAB — COMPREHENSIVE METABOLIC PANEL
ALT: 13 U/L (ref 0–44)
AST: 31 U/L (ref 15–41)
Albumin: 3.1 g/dL — ABNORMAL LOW (ref 3.5–5.0)
Alkaline Phosphatase: 99 U/L (ref 38–126)
Anion gap: 3 — ABNORMAL LOW (ref 5–15)
BUN: 14 mg/dL (ref 8–23)
CO2: 29 mmol/L (ref 22–32)
Calcium: 8.5 mg/dL — ABNORMAL LOW (ref 8.9–10.3)
Chloride: 107 mmol/L (ref 98–111)
Creatinine, Ser: 1.03 mg/dL (ref 0.61–1.24)
GFR, Estimated: >60 mL/min (ref >60)
Glucose, Bld: 91 mg/dL (ref 70–99)
Potassium: 3.8 mmol/L (ref 3.5–5.1)
Sodium: 139 mmol/L (ref 135–145)
Total Bilirubin: 0.7 mg/dL (ref 0.3–1.2)
Total Protein: 5.5 g/dL — ABNORMAL LOW (ref 6.5–8.1)

## 2022-07-15 MED ORDER — HEPARIN SOD (PORK) LOCK FLUSH 100 UNIT/ML IV SOLN
500.0000 [IU] | Freq: Once | INTRAVENOUS | Status: AC | PRN
  Administered 2022-07-15: 500 [IU]

## 2022-07-15 MED ORDER — SODIUM CHLORIDE 0.9% FLUSH
10.0000 mL | INTRAVENOUS | Status: DC | PRN
  Administered 2022-07-15: 10 mL

## 2022-07-15 NOTE — Assessment & Plan Note (Signed)
-  cT2N0M0, stage IB, ypT2N0  -diagnosed in 07/2021 -received neoadjuvant chemo gemcitabine alone on 08/20/21. Abraxane was added with cycle 2 (09/17/21), but he tolerated poorly and discontinued after first dose.  --s/p whipple procedure on 11/15/21 with Dr. Zenia Resides. 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 Margins and lymph nodes negative. -he restarted adjuvant gemcitabine on 01/20/22, completed 06/18/2022

## 2022-07-15 NOTE — Assessment & Plan Note (Signed)
-  new today -Unusual since his last chemotherapy was a month ago -Will monitor closely, he is not on any blood thinner, no active bleeding

## 2022-07-17 LAB — CANCER ANTIGEN 19-9: CA 19-9: 21 U/mL (ref 0–35)

## 2022-07-18 ENCOUNTER — Telehealth (HOSPITAL_BASED_OUTPATIENT_CLINIC_OR_DEPARTMENT_OTHER): Payer: Self-pay | Admitting: Cardiology

## 2022-07-18 NOTE — Telephone Encounter (Signed)
Left message for patient to call and reschedule the 07/15/22 missed Echocardiogram appointemnt

## 2022-07-20 ENCOUNTER — Other Ambulatory Visit: Payer: Self-pay

## 2022-07-20 DIAGNOSIS — C250 Malignant neoplasm of head of pancreas: Secondary | ICD-10-CM

## 2022-07-20 DIAGNOSIS — C251 Malignant neoplasm of body of pancreas: Secondary | ICD-10-CM

## 2022-07-21 ENCOUNTER — Inpatient Hospital Stay: Payer: Medicare HMO

## 2022-07-21 ENCOUNTER — Other Ambulatory Visit: Payer: Self-pay | Admitting: Hematology

## 2022-07-21 ENCOUNTER — Other Ambulatory Visit: Payer: Medicare HMO

## 2022-07-21 ENCOUNTER — Other Ambulatory Visit: Payer: Self-pay

## 2022-07-21 ENCOUNTER — Telehealth: Payer: Self-pay

## 2022-07-21 ENCOUNTER — Other Ambulatory Visit: Payer: Self-pay | Admitting: *Deleted

## 2022-07-21 DIAGNOSIS — C251 Malignant neoplasm of body of pancreas: Secondary | ICD-10-CM

## 2022-07-21 DIAGNOSIS — C250 Malignant neoplasm of head of pancreas: Secondary | ICD-10-CM

## 2022-07-21 DIAGNOSIS — D469 Myelodysplastic syndrome, unspecified: Secondary | ICD-10-CM | POA: Diagnosis not present

## 2022-07-21 DIAGNOSIS — Z95828 Presence of other vascular implants and grafts: Secondary | ICD-10-CM

## 2022-07-21 DIAGNOSIS — D696 Thrombocytopenia, unspecified: Secondary | ICD-10-CM

## 2022-07-21 LAB — CMP (CANCER CENTER ONLY)
ALT: 14 U/L (ref 0–44)
AST: 34 U/L (ref 15–41)
Albumin: 2.9 g/dL — ABNORMAL LOW (ref 3.5–5.0)
Alkaline Phosphatase: 92 U/L (ref 38–126)
Anion gap: 5 (ref 5–15)
BUN: 15 mg/dL (ref 8–23)
CO2: 28 mmol/L (ref 22–32)
Calcium: 8.4 mg/dL — ABNORMAL LOW (ref 8.9–10.3)
Chloride: 108 mmol/L (ref 98–111)
Creatinine: 1.01 mg/dL (ref 0.61–1.24)
GFR, Estimated: >60 mL/min (ref >60)
Glucose, Bld: 76 mg/dL (ref 70–99)
Potassium: 3.7 mmol/L (ref 3.5–5.1)
Sodium: 141 mmol/L (ref 135–145)
Total Bilirubin: 0.7 mg/dL (ref 0.3–1.2)
Total Protein: 5.2 g/dL — ABNORMAL LOW (ref 6.5–8.1)

## 2022-07-21 LAB — CBC WITH DIFFERENTIAL (CANCER CENTER ONLY)
Abs Immature Granulocytes: 0.02 10*3/uL (ref 0.00–0.07)
Basophils Absolute: 0 10*3/uL (ref 0.0–0.1)
Basophils Relative: 0 %
Eosinophils Absolute: 0.1 10*3/uL (ref 0.0–0.5)
Eosinophils Relative: 1 %
HCT: 25.8 % — ABNORMAL LOW (ref 39.0–52.0)
Hemoglobin: 8.6 g/dL — ABNORMAL LOW (ref 13.0–17.0)
Immature Granulocytes: 0 %
Lymphocytes Relative: 27 %
Lymphs Abs: 1.2 10*3/uL (ref 0.7–4.0)
MCH: 31.2 pg (ref 26.0–34.0)
MCHC: 33.3 g/dL (ref 30.0–36.0)
MCV: 93.5 fL (ref 80.0–100.0)
Monocytes Absolute: 0.5 10*3/uL (ref 0.1–1.0)
Monocytes Relative: 11 %
Neutro Abs: 2.7 10*3/uL (ref 1.7–7.7)
Neutrophils Relative %: 61 %
Platelet Count: 19 10*3/uL — ABNORMAL LOW (ref 150–400)
RBC: 2.76 MIL/uL — ABNORMAL LOW (ref 4.22–5.81)
RDW: 19.7 % — ABNORMAL HIGH (ref 11.5–15.5)
WBC Count: 4.5 10*3/uL (ref 4.0–10.5)
nRBC: 0 % (ref 0.0–0.2)

## 2022-07-21 LAB — TYPE AND SCREEN
ABO/RH(D): A POS
Antibody Screen: NEGATIVE

## 2022-07-21 LAB — SAMPLE TO BLOOD BANK

## 2022-07-21 MED ORDER — SODIUM CHLORIDE 0.9% FLUSH
10.0000 mL | INTRAVENOUS | Status: DC | PRN
  Administered 2022-07-21: 10 mL

## 2022-07-21 MED ORDER — HEPARIN SOD (PORK) LOCK FLUSH 100 UNIT/ML IV SOLN
500.0000 [IU] | Freq: Once | INTRAVENOUS | Status: AC | PRN
  Administered 2022-07-21: 500 [IU]

## 2022-07-21 NOTE — Telephone Encounter (Signed)
Lab called regarding pt's Plts today.  Pt's plts are 19 and on 07/15/2022 pt's plts were 58.  Pt not actively receiving chemotherapy.  Notified Dr. Burr Medico.  Pt's Hbg 8.6 today.

## 2022-07-21 NOTE — Assessment & Plan Note (Signed)
-  cT2N0M0, stage IB, ypT2N0  -diagnosed in 07/2021 -received neoadjuvant chemo gemcitabine alone on 08/20/21. Abraxane was added with cycle 2 (09/17/21), but he tolerated poorly and discontinued after first dose.  --s/p whipple procedure on 11/15/21 with Dr. Zenia Resides. 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 Margins and lymph nodes negative. -he restarted adjuvant gemcitabine on 01/20/22, completed 06/18/2022 -recent CT 05/03/2022 showed NED

## 2022-07-21 NOTE — Assessment & Plan Note (Signed)
-  new since early Dec 2023 -repeated CBC yesterday showed worsening plt 19K -concerning for bone metastasis or primary bone marrow disease, I recommend bone marrow biopsy

## 2022-07-22 ENCOUNTER — Other Ambulatory Visit: Payer: Self-pay

## 2022-07-22 ENCOUNTER — Telehealth: Payer: Self-pay

## 2022-07-22 ENCOUNTER — Encounter: Payer: Self-pay | Admitting: Hematology

## 2022-07-22 ENCOUNTER — Inpatient Hospital Stay (HOSPITAL_BASED_OUTPATIENT_CLINIC_OR_DEPARTMENT_OTHER): Payer: Medicare HMO | Admitting: Hematology

## 2022-07-22 ENCOUNTER — Inpatient Hospital Stay: Payer: Medicare HMO

## 2022-07-22 VITALS — BP 178/86 | HR 72 | Temp 97.8°F | Resp 18 | Ht 70.0 in | Wt 132.6 lb

## 2022-07-22 DIAGNOSIS — D469 Myelodysplastic syndrome, unspecified: Secondary | ICD-10-CM | POA: Diagnosis not present

## 2022-07-22 DIAGNOSIS — C251 Malignant neoplasm of body of pancreas: Secondary | ICD-10-CM | POA: Diagnosis not present

## 2022-07-22 DIAGNOSIS — D696 Thrombocytopenia, unspecified: Secondary | ICD-10-CM

## 2022-07-22 DIAGNOSIS — C250 Malignant neoplasm of head of pancreas: Secondary | ICD-10-CM

## 2022-07-22 DIAGNOSIS — D649 Anemia, unspecified: Secondary | ICD-10-CM

## 2022-07-22 LAB — DIC (DISSEMINATED INTRAVASCULAR COAGULATION)PANEL
D-Dimer, Quant: 1.35 ug/mL-FEU — ABNORMAL HIGH (ref 0.00–0.50)
Fibrinogen: 161 mg/dL — ABNORMAL LOW (ref 210–475)
INR: 1.3 — ABNORMAL HIGH (ref 0.8–1.2)
Platelets: 46 10*3/uL — ABNORMAL LOW (ref 150–400)
Prothrombin Time: 16 seconds — ABNORMAL HIGH (ref 11.4–15.2)
aPTT: 32 seconds (ref 24–36)

## 2022-07-22 LAB — IMMATURE PLATELET FRACTION: Immature Platelet Fraction: 6.8 % (ref 1.2–8.6)

## 2022-07-22 LAB — TECHNOLOGIST SMEAR REVIEW: Plt Morphology: NORMAL

## 2022-07-22 MED ORDER — DIPHENHYDRAMINE HCL 25 MG PO CAPS
25.0000 mg | ORAL_CAPSULE | Freq: Once | ORAL | Status: AC
  Administered 2022-07-22: 25 mg via ORAL
  Filled 2022-07-22: qty 1

## 2022-07-22 MED ORDER — HEPARIN SOD (PORK) LOCK FLUSH 100 UNIT/ML IV SOLN
500.0000 [IU] | Freq: Every day | INTRAVENOUS | Status: AC | PRN
  Administered 2022-07-22: 500 [IU]

## 2022-07-22 MED ORDER — SODIUM CHLORIDE 0.9% IV SOLUTION
250.0000 mL | Freq: Once | INTRAVENOUS | Status: AC
  Administered 2022-07-22: 250 mL via INTRAVENOUS

## 2022-07-22 MED ORDER — SODIUM CHLORIDE 0.9% FLUSH
10.0000 mL | INTRAVENOUS | Status: AC | PRN
  Administered 2022-07-22: 10 mL

## 2022-07-22 MED ORDER — ACETAMINOPHEN 325 MG PO TABS
650.0000 mg | ORAL_TABLET | Freq: Once | ORAL | Status: AC
  Administered 2022-07-22: 650 mg via ORAL
  Filled 2022-07-22: qty 2

## 2022-07-22 NOTE — Progress Notes (Signed)
[place]   [phone] [phone]   Clinic Follow up Note   Patient Care Team: Ronnell Freshwater, NP as PCP - General (Family Medicine) Buford Dresser, MD as PCP - Cardiology (Cardiology) Truitt Merle, MD as Consulting Physician (Oncology) Royston Bake, RN as Oncology Nurse Navigator (Oncology)  Date of Service:  07/22/2022  CHIEF COMPLAINT: f/u of  pancreatic cancer    CURRENT THERAPY:   Gemcitabine, days 1 and 8 q21d, starting 01/20/22     ASSESSMENT:  Angel Mauger. is a 81 y.o. 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 Abraxane was added with cycle 2 (09/17/21), but he tolerated poorly and discontinued after first dose.  --s/p whipple procedure on 11/15/21 with Dr. Zenia Resides. 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 Margins and lymph nodes negative. -he restarted adjuvant gemcitabine on 01/20/22, completed 06/18/2022 -recent CT 05/03/2022 showed NED   Thrombocytopenia ([place]) -new since early Dec 2023 -repeated CBC yesterday showed worsening plt 19K -concerning for bone metastasis or primary bone marrow disease, I recommend bone marrow biopsy and PET scan, he agrees  -I reviewed other etiology for thrombocytopenia, such as ITP.  If his immature platelet is high, I will let him try dexamethasone 20 mg daily for 4 days to see if he responds.    PLAN: -lab reviewed.Platelet count low, will proceed plt transfusion today  -bone Marrow Biopsy by IR next week  -PET Scan in 1-2 weeks  -lab and transfusion mid next week    SUMMARY OF ONCOLOGIC HISTORY: Oncology History Overview Note   Cancer Staging  Pancreatic cancer [facility]) Staging form: Exocrine Pancreas, AJCC 8th Edition - Clinical stage from 08/05/2021: Stage IB (cT2, cN0, cM0) - Signed by Truitt Merle, MD on 08/17/2021 Stage prefix:  Initial diagnosis Total positive nodes: 0     Pancreatic mass  07/18/2021 Initial Diagnosis   Pancreatic mass   07/18/2021 Imaging   CT Abdomen Pelvis W Contrast  IMPRESSION: 1. Suspected solid mass within the proximal body of the pancreas measures 2.3 x 2.6 x 2.0 cm. There is downstream dilation of the main pancreatic duct and its branches. There is also intra and extrahepatic biliary ductal dilation. Further evaluation with MRI of the abdomen, pancreatic protocol, when clinically feasible, may be considered. 2. 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 3. Aortic atherosclerosis   07/20/2021 Procedure   DG ERCP  IMPRESSION: Nondiagnostic ERCP as above. Correlation with the operative report is advised.  - The examination was suspicious for a biliary stricture in the bile duct. - Examination was suspicious for carcinoma of the head of the pancreas. - Attempts at a cholangiogram failed.   07/22/2021 Pathology Results   CASE: [id number]   FINAL MICROSCOPIC DIAGNOSIS:  - Suspicious for malignancy  - See comment   DIAGNOSTIC COMMENTS:  There are rare cells with cytologic atypia suspicious for  adenocarcinoma.  Dr. Vic Ripper agrees.    07/22/2021 Procedure   IR INT EXT BILIARY DRAIN WITH CHOLANGIOGRAM ( IMPRESSION: 1. 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 2. Successful brush biopsy of obstructing lesion x3. 3. Successful placement of a 10 French internal/external biliary drainage catheter.   PLAN: 1. Follow bilirubin. When significant downward trend is clear, the bag should be capped. Recommend capping bag before discharge home if possible. 2. Follow-up in IR in 4-6 weeks  for initial biliary tube check and exchange. If initial brush biopsies are negative, repeat biopsy could be considered at that time.   Pancreatic cancer ([place])  08/05/2021 Cancer Staging   Staging form:  Exocrine Pancreas, AJCC 8th Edition - Clinical stage from 08/05/2021: Stage IB (cT2, cN0, cM0) - Signed by Truitt Merle, MD on 08/17/2021 Stage prefix: Initial diagnosis Total positive nodes: 0   08/14/2021 Initial Diagnosis   Pancreatic cancer ([place])   08/20/2021 - 03/23/2022 Chemotherapy   Patient is on Treatment Plan : PANCREATIC Abraxane / Gemcitabine D1,8 q21d     08/20/2021 -  Chemotherapy   Patient is on Treatment Plan : PANCREAS Gemcitabine D1,8 (1000) q21d x 8 Cycles     11/15/2021 Cancer Staging   Staging form: Exocrine Pancreas, AJCC 8th Edition - Pathologic stage from 11/15/2021: Stage IB (ypT2, pN0, cM0) - Signed by Truitt Merle, MD on 01/03/2022 Stage prefix: Post-therapy Total positive nodes: 0 Histologic grade (G): G3 Histologic grading system: 3 grade system Residual tumor (R): R0 - None      INTERVAL HISTORY:  Angel POSTHUMUS Sr. is here for a follow up of pancreatic cancer   He was last seen by me on 07/15/2022 He presents to the clinic accompanied by daughter.    All other systems were reviewed with the patient and are negative.  MEDICAL HISTORY:  Past Medical History:  Diagnosis Date   Aneurysm ([place])    2017   Anxiety    Arthritis    Depression    Family history of breast cancer    Family history of lung cancer    Headache    HLD (hyperlipidemia)    Hx of inguinal hernia repair    Hypertension    Hypothyroidism    Overactive bladder    Pancreatic cancer ([facility])    Pneumonia    Shingles    Sleep apnea    Patient denies   Thoracic ascending aortic aneurysm ([place])    mildly dilated 4.0cm per 08/12/21 CT   Thyroid disease    Varicose veins of both lower extremities     SURGICAL HISTORY: Past Surgical History:  Procedure Laterality Date   BILATERAL CARPAL TUNNEL RELEASE     CATARACT EXTRACTION, BILATERAL Bilateral    ELBOW SURGERY Bilateral    ENDOSCOPIC RETROGRADE CHOLANGIOPANCREATOGRAPHY (ERCP) WITH PROPOFOL N/A 07/20/2021   Procedure: ENDOSCOPIC RETROGRADE  CHOLANGIOPANCREATOGRAPHY (ERCP) WITH PROPOFOL;  Surgeon: Gatha Mayer, MD;  [facility] ENDOSCOPY;  Service: Endoscopy;  Laterality: N/A;   ESOPHAGOGASTRODUODENOSCOPY (EGD) WITH PROPOFOL N/A 08/05/2021   Procedure: ESOPHAGOGASTRODUODENOSCOPY (EGD) WITH PROPOFOL;  Surgeon: Rush Landmark Telford Nab., MD;  Location: [place];  Service: Gastroenterology;  Laterality: N/A;   EUS N/A 08/05/2021   Procedure: UPPER ENDOSCOPIC ULTRASOUND (EUS) RADIAL;  Surgeon: Irving Copas., MD;  Location: [place];  Service: Gastroenterology;  Laterality: N/A;   EYE SURGERY     bilateral cataracts   FINE NEEDLE ASPIRATION  08/05/2021   Procedure: FINE NEEDLE ASPIRATION (FNA) LINEAR;  Surgeon: Irving Copas., MD;  Location: [place];  Service: Gastroenterology;;   INGUINAL HERNIA REPAIR Right 1951   IR ANGIO INTRA EXTRACRAN SEL COM CAROTID INNOMINATE BILAT MOD SED  02/20/2019   IR BILIARY STENT(S) EXISTING ACCESS INC DILATION CATH EXCHANGE  09/20/2021   IR ENDOLUMINAL BX OF BILIARY TREE  07/22/2021   IR GENERIC HISTORICAL  10/28/2016   IR RADIOLOGIST EVAL & MGMT 10/28/2016 [facility]INTERV RAD   IR INT EXT BILIARY DRAIN WITH CHOLANGIOGRAM  07/22/2021  JOINT REPLACEMENT     LAPAROSCOPY N/A 11/15/2021   Procedure: LAPAROSCOPIC DIAGNOSTIC STAGING;  Surgeon: Dwan Bolt, MD;  Location: [place];  Service: General;  Laterality: N/A;   PORTACATH PLACEMENT N/A 08/19/2021   Procedure: INSERTION PORT-A-CATH;  Surgeon: Dwan Bolt, MD;  Location: [facility];  Service: General;  Laterality: N/A;  LMA   RADIOLOGY WITH ANESTHESIA N/A 10/21/2015   Procedure: RADIOLOGY WITH ANESTHESIA;  Surgeon: Luanne Bras, MD;  Location: [place];  Service: Radiology;  Laterality: N/A;   RADIOLOGY WITH ANESTHESIA N/A 02/20/2019   Procedure: [employer];  Surgeon: Luanne Bras, MD;  Location: [place];  Service: Radiology;  Laterality: N/A;   ROTATOR CUFF REPAIR Bilateral    VARICOSE VEIN SURGERY     WHIPPLE  PROCEDURE N/A 11/15/2021   Procedure: WHIPPLE PROCEDURE;  Surgeon: Dwan Bolt, MD;  Location: [place];  Service: General;  Laterality: N/A;    I have reviewed the social history and family history with the patient and they are unchanged from previous note.  ALLERGIES:  has No Known Allergies.  MEDICATIONS:  Current Outpatient Medications  Medication Sig Dispense Refill   acetaminophen (TYLENOL) 500 MG tablet Take 1 tablet (500 mg total) by mouth every 6 (six) hours as needed for mild pain. 30 tablet 0   Cholecalciferol (VITAMIN D3) 50 MCG (2000 UT) TABS Take 2,000 Units by mouth daily.     dexamethasone (DECADRON) 4 MG tablet Take 8 mg by mouth daily.     docusate sodium (COLACE) 100 MG capsule Take 1 capsule (100 mg total) by mouth 2 (two) times daily. (Patient not taking: Reported on 06/29/2022) 28 capsule 0   escitalopram (LEXAPRO) 20 MG tablet Take 1 tablet (20 mg total) by mouth daily. 90 tablet 1   famotidine (PEPCID) 20 MG tablet Take 1 tablet (20 mg total) by mouth 2 (two) times daily. 60 tablet 1   ferrous sulfate 325 (65 FE) MG EC tablet Take 1 tablet (325 mg total) by mouth daily with breakfast. (Patient not taking: Reported on 06/29/2022) 30 tablet 3   levothyroxine (SYNTHROID) 112 MCG tablet TAKE 1 TABLET (112 MCG TOTAL) BY MOUTH DAILY BEFORE BREAKFAST. 90 tablet 1   lipase/protease/amylase (CREON) 36000 UNITS CPEP capsule Take 1 capsule (36,000 Units total) by mouth 3 (three) times daily before meals. 180 capsule 0   lovastatin (MEVACOR) 20 MG tablet Take 1 tablet (20 mg total) by mouth at bedtime. 90 tablet 0   mirtazapine (REMERON) 7.5 MG tablet Take 1 tablet (7.5 mg total) by mouth at bedtime. 90 tablet 0   Multiple Vitamin (MULTIVITAMIN WITH MINERALS) TABS tablet Take 1 tablet by mouth daily. 30 tablet 0   nystatin-diphenhydrAMINE-alum & mag hydroxide-simeth Take 5 MLs by mouth 3 (three) times daily as needed for mouth pain. 120 mL 1   pantoprazole (PROTONIX) 40 MG tablet  Take 1 tablet by mouth daily. 90 tablet 0   polyethylene glycol (MIRALAX / GLYCOLAX) 17 g packet Take 17 g by mouth daily as needed for moderate constipation. (Patient not taking: Reported on 06/29/2022) 14 each 0   prochlorperazine (COMPAZINE) 10 MG tablet Take 1 tablet (10 mg total) by mouth every 6 (six) hours as needed (Nausea or vomiting). 30 tablet 1   vitamin B-12 (CYANOCOBALAMIN) 1000 MCG tablet Take 1,000 mcg by mouth 2 (two) times daily.     No current facility-administered medications for this visit.   Facility-Administered Medications Ordered in Other Visits  Medication Dose Route Frequency Provider Last  Rate Last Admin   heparin lock flush 100 unit/mL  500 Units Intracatheter Daily PRN Truitt Merle, MD       sodium chloride flush (NS) 0.9 % injection 10 mL  10 mL Intracatheter PRN Truitt Merle, MD        PHYSICAL EXAMINATION: ECOG PERFORMANCE STATUS: 1 - Symptomatic but completely ambulatory  Vitals:   07/22/22 0813  BP: (!) 178/86  Pulse: 72  Resp: 18  Temp: 97.8 F (36.6 C)  SpO2: 100%   Wt Readings from Last 3 Encounters:  07/22/22 132 lb 9.6 oz (60.1 kg)  07/15/22 128 lb 8 oz (58.3 kg)  06/29/22 132 lb 11.2 oz (60.2 kg)     GENERAL:alert, no distress and comfortable SKIN: skin color normal, no rashes or significant lesions EYES: normal, Conjunctiva are pink and non-injected, sclera clear  NEURO: alert & oriented x 3 with fluent speech  LABORATORY DATA:  I have reviewed the data as listed    Latest Ref Rng & Units 07/21/2022    1:03 PM 07/15/2022   10:52 AM 06/16/2022   12:29 PM  CBC  WBC 4.0 - 10.5 K/uL 4.5  4.6  1.6   Hemoglobin 13.0 - 17.0 g/dL 8.6  8.7  7.7   Hematocrit 39.0 - 52.0 % 25.8  26.7  23.1   Platelets 150 - 400 K/uL 19  58  368         Latest Ref Rng & Units 07/21/2022    1:03 PM 07/15/2022   10:52 AM 06/16/2022   12:29 PM  CMP  Glucose 70 - 99 mg/dL 76  91  110   BUN 8 - 23 mg/dL _0 Creatinine 0.61 - 1.24 mg/dL 1.01  1.03  1.24    Sodium 135 - 145 mmol/L 141  139  136   Potassium 3.5 - 5.1 mmol/L 3.7  3.8  4.1   Chloride 98 - 111 mmol/L 108  107  102   CO2 22 - 32 mmol/L _1 Calcium 8.9 - 10.3 mg/dL 8.4  8.5  8.4   Total Protein 6.5 - 8.1 g/dL 5.2  5.5  5.6   Total Bilirubin 0.3 - 1.2 mg/dL 0.7  0.7  0.6   Alkaline Phos 38 - 126 U/L 92  99  69   AST 15 - 41 U/L 34  31  27   ALT 0 - 44 U/L _2 RADIOGRAPHIC STUDIES: I have personally reviewed the radiological images as listed and agreed with the findings in the report. No results found.    Orders Placed This Encounter  Procedures   NM PET Image Initial (PI) Skull Base To Thigh    Standing Status:   Future    Standing Expiration Date:   07/22/2023    Order Specific Question:   If indicated for the ordered procedure, I authorize the administration of a radiopharmaceutical per Radiology protocol    Answer:   Yes    Order Specific Question:   Preferred imaging location?    Answer:   [place]   IR BONE MARROW BIOPSY & ASPIRATION    Standing Status:   Future    Standing Expiration Date:   07/23/2023    Order Specific Question:   Reason for Exam (SYMPTOM  OR DIAGNOSIS REQUIRED)    Answer:   thrombocytopenia, rule out bone metastasis or other disease  Order Specific Question:   Preferred Imaging Location?    Answer:   [place]   Immature Platelet Fraction    Standing Status:   Future    Standing Expiration Date:   07/22/2023   Vitamin B12    Standing Status:   Standing    Number of Occurrences:   5    Standing Expiration Date:   07/22/2023   Technologist smear review    Order Specific Question:   Clinical information:    Answer:   thrombocytopenia   All questions were answered. The patient knows to call the clinic with any problems, questions or concerns. No barriers to learning was detected. The total time spent in the appointment was 30 minutes.     Truitt Merle, MD 07/22/2022   Felicity Coyer, CMA, am acting as scribe  for Truitt Merle, MD.   I have reviewed the above documentation for accuracy and completeness, and I agree with the above.

## 2022-07-22 NOTE — Telephone Encounter (Signed)
Left message for patient to call and discuss rescheduling the Echocardiogram ordered by Dr. Harrell Gave

## 2022-07-22 NOTE — Progress Notes (Signed)
Per Burr Medico MD, ok to discharge with elevated BP (see flowsheets). Pt advised to re-take BP at home.

## 2022-07-22 NOTE — Telephone Encounter (Signed)
Spoke with pt's daughter via telephone to inform pt and daughter that Dr. Burr Medico has reviewed the pt's labs and based off the pt's lab results Dr. Burr Medico will not be prescribing a steroid.  Instructed pt's daughter to contact Donita Brooks with [employer] to get pt's Bone Marrow Bx scheduled as soon as possible.  Kenney Houseman had tried calling pt's daughter earlier to schedule the BM Bx but was unable to reach pt.  Pt's daughter stated she will call Kenney Houseman now to get the pt's BM Bx scheduled.

## 2022-07-22 NOTE — Addendum Note (Signed)
Addended by: Truitt Merle on: 07/22/2022 09:05 AM   Modules accepted: Orders

## 2022-07-22 NOTE — Patient Instructions (Signed)
Platelet Transfusion A platelet transfusion is a procedure in which a person receives donated platelets through an IV. Platelets are parts of blood that stick together and form a clot to help the body stop bleeding after an injury. If you have too few platelets, your blood may have trouble clotting. This may cause you to bleed and bruise very easily. You may need a platelet transfusion if you have a condition that causes a low number of platelets (thrombocytopenia). A platelet transfusion may be used to stop or prevent excessive bleeding. Tell a health care provider about: Any reactions you have had during previous transfusions. Any allergies you have. All medicines you are taking, including vitamins, herbs, eye drops, creams, and over-the-counter medicines. Any bleeding problems you have. Any surgeries you have had. Any medical conditions you have. Whether you are pregnant or may be pregnant. What are the risks? Generally, this is a safe procedure. However, problems may occur, including: Fever. Infection. Allergic reaction to the donated (donor) platelets. Your body's disease-fighting system (immune system) attacking the donor platelets (hemolytic reaction). This is rare. A rare reaction that causes lung damage (transfusion-related acute lung injury). What happens before the procedure? Medicines Ask your health care provider about: Changing or stopping your regular medicines. This is especially important if you are taking diabetes medicines or blood thinners. Taking medicines such as aspirin and ibuprofen. These medicines can thin your blood. Do not take these medicines unless your health care provider tells you to take them. Taking over-the-counter medicines, vitamins, herbs, and supplements. General instructions You will have a blood test to determine your blood type. Your blood type determines what kind of platelets you will be given. Follow instructions from your health care provider  about eating or drinking restrictions. If you have had an allergic reaction to a transfusion in the past, you may be given medicine to help prevent a reaction. Your temperature, blood pressure, pulse, and breathing will be monitored. What happens during the procedure?  An IV will be inserted into one of your veins. For your safety, two health care providers will verify your identity along with the donor platelets about to be infused. A bag of donor platelets will be connected to your IV. The platelets will flow into your bloodstream. This usually takes 30-60 minutes. Your temperature, blood pressure, pulse, and breathing will be monitored during the transfusion. This helps detect early signs of any reaction. You will also be monitored for other symptoms that may indicate a reaction, including chills, hives, or itching. If you have signs of a reaction at any time, your transfusion will be stopped, and you may be given medicine to help manage the reaction. When your transfusion is complete, your IV will be removed. Pressure may be applied to the IV site for a few minutes to stop any bleeding. The IV site will be covered with a bandage (dressing). The procedure may vary among health care providers and hospitals. What can I expect after the procedure? Your blood pressure, temperature, pulse, and breathing will be monitored until you leave the hospital or clinic. You may have some bruising and soreness at your IV site. Follow these instructions at home: Medicines Take over-the-counter and prescription medicines only as told by your health care provider. Talk with your health care provider before you take any medicines that contain aspirin or NSAIDs, such as ibuprofen. These medicines increase your risk for dangerous bleeding. IV site care Check your IV site every day for signs of infection. Check for:   Redness, swelling, or pain. Fluid or blood. If fluid or blood drains from your IV site, use your  hands to press down firmly on a bandage covering the area for a minute or two. Doing this should stop the bleeding. Warmth. Pus or a bad smell. General instructions Change or remove your dressing as told by your health care provider. Return to your normal activities as told by your health care provider. Ask your health care provider what activities are safe for you. Do not take baths, swim, or use a hot tub until your health care provider approves. Ask your health care provider if you may take showers. Keep all follow-up visits. This is important. Contact a health care provider if: You have a headache that does not go away with medicine. You have hives, rash, or itchy skin. You have nausea or vomiting. You feel unusually tired or weak. You have signs of infection at your IV site. Get help right away if: You have a fever or chills. You urinate less often than usual. Your urine is darker colored than normal. You have any of the following: Trouble breathing. Pain in your back, abdomen, or chest. Cool, clammy skin. A fast heartbeat. Summary Platelets are tiny pieces of blood cells that clump together to form a blood clot when you have an injury. If you have too few platelets, your blood may have trouble clotting. A platelet transfusion is a procedure in which you receive donated platelets through an IV. A platelet transfusion may be used to stop or prevent excessive bleeding. After the procedure, check your IV site every day for signs of infection. This information is not intended to replace advice given to you by your health care provider. Make sure you discuss any questions you have with your health care provider. Document Revised: 02/04/2021 Document Reviewed: 02/04/2021 Elsevier Patient Education  2023 Elsevier Inc.  

## 2022-07-23 LAB — PREPARE PLATELET PHERESIS: Unit division: 0

## 2022-07-23 LAB — BPAM PLATELET PHERESIS
Blood Product Expiration Date: 202312082359
ISSUE DATE / TIME: 202312080849
Unit Type and Rh: 6200

## 2022-07-25 ENCOUNTER — Telehealth: Payer: Self-pay | Admitting: Hematology

## 2022-07-25 NOTE — Telephone Encounter (Signed)
Called patient to notify of new upcoming appointment. Left voicemail with appointment information

## 2022-07-26 ENCOUNTER — Telehealth: Payer: Self-pay | Admitting: Hematology

## 2022-07-26 ENCOUNTER — Other Ambulatory Visit: Payer: Self-pay

## 2022-07-26 DIAGNOSIS — K432 Incisional hernia without obstruction or gangrene: Secondary | ICD-10-CM | POA: Diagnosis not present

## 2022-07-26 DIAGNOSIS — K8681 Exocrine pancreatic insufficiency: Secondary | ICD-10-CM | POA: Diagnosis not present

## 2022-07-26 DIAGNOSIS — C259 Malignant neoplasm of pancreas, unspecified: Secondary | ICD-10-CM | POA: Diagnosis not present

## 2022-07-26 NOTE — Telephone Encounter (Signed)
Left patient daughter Lattie Haw an vm regarding patient appointments tomorrow, making her aware to come in afte PET scan is done

## 2022-07-27 ENCOUNTER — Other Ambulatory Visit: Payer: Self-pay

## 2022-07-27 ENCOUNTER — Inpatient Hospital Stay: Payer: Medicare HMO

## 2022-07-27 ENCOUNTER — Ambulatory Visit (HOSPITAL_COMMUNITY)
Admission: RE | Admit: 2022-07-27 | Discharge: 2022-07-27 | Disposition: A | Discharge status: Home / Self Care | Payer: Medicare HMO | Source: Ambulatory Visit | Attending: Hematology | Admitting: Hematology

## 2022-07-27 ENCOUNTER — Other Ambulatory Visit: Payer: Self-pay | Admitting: Radiology

## 2022-07-27 VITALS — BP 175/80 | HR 62 | Temp 98.5°F | Resp 16

## 2022-07-27 DIAGNOSIS — R609 Edema, unspecified: Secondary | ICD-10-CM | POA: Diagnosis not present

## 2022-07-27 DIAGNOSIS — D649 Anemia, unspecified: Secondary | ICD-10-CM | POA: Insufficient documentation

## 2022-07-27 DIAGNOSIS — Z1379 Encounter for other screening for genetic and chromosomal anomalies: Secondary | ICD-10-CM | POA: Diagnosis not present

## 2022-07-27 DIAGNOSIS — D696 Thrombocytopenia, unspecified: Secondary | ICD-10-CM

## 2022-07-27 DIAGNOSIS — Z95828 Presence of other vascular implants and grafts: Secondary | ICD-10-CM

## 2022-07-27 DIAGNOSIS — J90 Pleural effusion, not elsewhere classified: Secondary | ICD-10-CM | POA: Diagnosis not present

## 2022-07-27 DIAGNOSIS — R519 Headache, unspecified: Secondary | ICD-10-CM | POA: Insufficient documentation

## 2022-07-27 DIAGNOSIS — I700 Atherosclerosis of aorta: Secondary | ICD-10-CM | POA: Insufficient documentation

## 2022-07-27 DIAGNOSIS — C251 Malignant neoplasm of body of pancreas: Secondary | ICD-10-CM | POA: Diagnosis not present

## 2022-07-27 DIAGNOSIS — J320 Chronic maxillary sinusitis: Secondary | ICD-10-CM | POA: Insufficient documentation

## 2022-07-27 DIAGNOSIS — D469 Myelodysplastic syndrome, unspecified: Secondary | ICD-10-CM | POA: Diagnosis not present

## 2022-07-27 DIAGNOSIS — Z90411 Acquired partial absence of pancreas: Secondary | ICD-10-CM | POA: Diagnosis not present

## 2022-07-27 DIAGNOSIS — R0609 Other forms of dyspnea: Secondary | ICD-10-CM | POA: Insufficient documentation

## 2022-07-27 LAB — GLUCOSE, CAPILLARY: Glucose-Capillary: 90 mg/dL (ref 70–99)

## 2022-07-27 LAB — CBC WITH DIFFERENTIAL (CANCER CENTER ONLY)
Abs Immature Granulocytes: 0.01 10*3/uL (ref 0.00–0.07)
Basophils Absolute: 0 10*3/uL (ref 0.0–0.1)
Basophils Relative: 1 %
Eosinophils Absolute: 0.1 10*3/uL (ref 0.0–0.5)
Eosinophils Relative: 3 %
HCT: 27.3 % — ABNORMAL LOW (ref 39.0–52.0)
Hemoglobin: 8.9 g/dL — ABNORMAL LOW (ref 13.0–17.0)
Immature Granulocytes: 0 %
Lymphocytes Relative: 31 %
Lymphs Abs: 1.4 10*3/uL (ref 0.7–4.0)
MCH: 30.9 pg (ref 26.0–34.0)
MCHC: 32.6 g/dL (ref 30.0–36.0)
MCV: 94.8 fL (ref 80.0–100.0)
Monocytes Absolute: 0.3 10*3/uL (ref 0.1–1.0)
Monocytes Relative: 8 %
Neutro Abs: 2.5 10*3/uL (ref 1.7–7.7)
Neutrophils Relative %: 57 %
Platelet Count: 34 10*3/uL — ABNORMAL LOW (ref 150–400)
RBC: 2.88 MIL/uL — ABNORMAL LOW (ref 4.22–5.81)
RDW: 18 % — ABNORMAL HIGH (ref 11.5–15.5)
WBC Count: 4.3 10*3/uL (ref 4.0–10.5)
nRBC: 0 % (ref 0.0–0.2)

## 2022-07-27 LAB — CMP (CANCER CENTER ONLY)
ALT: 16 U/L (ref 0–44)
AST: 34 U/L (ref 15–41)
Albumin: 3 g/dL — ABNORMAL LOW (ref 3.5–5.0)
Alkaline Phosphatase: 82 U/L (ref 38–126)
Anion gap: 3 — ABNORMAL LOW (ref 5–15)
BUN: 15 mg/dL (ref 8–23)
CO2: 32 mmol/L (ref 22–32)
Calcium: 8.7 mg/dL — ABNORMAL LOW (ref 8.9–10.3)
Chloride: 107 mmol/L (ref 98–111)
Creatinine: 0.89 mg/dL (ref 0.61–1.24)
GFR, Estimated: >60 mL/min (ref >60)
Glucose, Bld: 89 mg/dL (ref 70–99)
Potassium: 3.9 mmol/L (ref 3.5–5.1)
Sodium: 142 mmol/L (ref 135–145)
Total Bilirubin: 0.6 mg/dL (ref 0.3–1.2)
Total Protein: 5.2 g/dL — ABNORMAL LOW (ref 6.5–8.1)

## 2022-07-27 LAB — SAMPLE TO BLOOD BANK

## 2022-07-27 LAB — VITAMIN B12: Vitamin B-12: 5785 pg/mL — ABNORMAL HIGH (ref 180–914)

## 2022-07-27 MED ORDER — SODIUM CHLORIDE 0.9% FLUSH: 10.0000 mL | INTRAVENOUS | Status: DC | PRN

## 2022-07-27 MED ORDER — HEPARIN SOD (PORK) LOCK FLUSH 100 UNIT/ML IV SOLN
500.0000 [IU] | Freq: Once | INTRAVENOUS | Status: AC | PRN
  Administered 2022-07-27: 500 [IU]

## 2022-07-27 MED ORDER — SODIUM CHLORIDE 0.9% FLUSH
10.0000 mL | INTRAVENOUS | Status: DC | PRN
  Administered 2022-07-27: 10 mL

## 2022-07-27 MED ORDER — FLUDEOXYGLUCOSE F - 18 (FDG) INJECTION
6.6000 | Freq: Once | INTRAVENOUS | Status: AC
  Administered 2022-07-27: 6.5 via INTRAVENOUS

## 2022-07-27 NOTE — Progress Notes (Signed)
Dr. Burr Medico notified of patient's hgb of 8.9 and platelets of 34. Per Dr. Burr Medico, patient does not need blood or platelet transfusion today. Patient made aware and was discharged in stable condition and no complaints.

## 2022-07-28 ENCOUNTER — Ambulatory Visit (HOSPITAL_COMMUNITY)
Admission: RE | Admit: 2022-07-28 | Discharge: 2022-07-28 | Disposition: A | Discharge status: Home / Self Care | Payer: Medicare HMO | Source: Ambulatory Visit | Attending: Hematology | Admitting: Hematology

## 2022-07-28 ENCOUNTER — Other Ambulatory Visit: Payer: Self-pay

## 2022-07-28 ENCOUNTER — Encounter (HOSPITAL_COMMUNITY): Payer: Self-pay

## 2022-07-28 DIAGNOSIS — J90 Pleural effusion, not elsewhere classified: Secondary | ICD-10-CM | POA: Diagnosis not present

## 2022-07-28 DIAGNOSIS — C251 Malignant neoplasm of body of pancreas: Secondary | ICD-10-CM | POA: Diagnosis not present

## 2022-07-28 DIAGNOSIS — Z90411 Acquired partial absence of pancreas: Secondary | ICD-10-CM | POA: Diagnosis not present

## 2022-07-28 DIAGNOSIS — R519 Headache, unspecified: Secondary | ICD-10-CM | POA: Diagnosis not present

## 2022-07-28 DIAGNOSIS — D696 Thrombocytopenia, unspecified: Secondary | ICD-10-CM | POA: Diagnosis not present

## 2022-07-28 DIAGNOSIS — R0609 Other forms of dyspnea: Secondary | ICD-10-CM | POA: Diagnosis not present

## 2022-07-28 DIAGNOSIS — R609 Edema, unspecified: Secondary | ICD-10-CM | POA: Diagnosis not present

## 2022-07-28 DIAGNOSIS — D649 Anemia, unspecified: Secondary | ICD-10-CM | POA: Diagnosis not present

## 2022-07-28 DIAGNOSIS — J320 Chronic maxillary sinusitis: Secondary | ICD-10-CM | POA: Diagnosis not present

## 2022-07-28 LAB — CBC WITH DIFFERENTIAL/PLATELET
Abs Immature Granulocytes: 0.01 10*3/uL (ref 0.00–0.07)
Basophils Absolute: 0 10*3/uL (ref 0.0–0.1)
Basophils Relative: 1 %
Eosinophils Absolute: 0.1 10*3/uL (ref 0.0–0.5)
Eosinophils Relative: 3 %
HCT: 26.7 % — ABNORMAL LOW (ref 39.0–52.0)
Hemoglobin: 8.2 g/dL — ABNORMAL LOW (ref 13.0–17.0)
Immature Granulocytes: 0 %
Lymphocytes Relative: 27 %
Lymphs Abs: 1.1 10*3/uL (ref 0.7–4.0)
MCH: 30.4 pg (ref 26.0–34.0)
MCHC: 30.7 g/dL (ref 30.0–36.0)
MCV: 98.9 fL (ref 80.0–100.0)
Monocytes Absolute: 0.5 10*3/uL (ref 0.1–1.0)
Monocytes Relative: 11 %
Neutro Abs: 2.4 10*3/uL (ref 1.7–7.7)
Neutrophils Relative %: 58 %
Platelets: 28 10*3/uL — CL (ref 150–400)
RBC: 2.7 MIL/uL — ABNORMAL LOW (ref 4.22–5.81)
RDW: 18 % — ABNORMAL HIGH (ref 11.5–15.5)
WBC: 4.1 10*3/uL (ref 4.0–10.5)
nRBC: 0 % (ref 0.0–0.2)

## 2022-07-28 MED ORDER — HYDRALAZINE HCL 20 MG/ML IJ SOLN
INTRAMUSCULAR | Status: AC | PRN
  Administered 2022-07-28: 10 mg via INTRAVENOUS

## 2022-07-28 MED ORDER — FENTANYL CITRATE (PF) 100 MCG/2ML IJ SOLN
INTRAMUSCULAR | Status: AC
  Filled 2022-07-28: qty 2

## 2022-07-28 MED ORDER — FENTANYL CITRATE (PF) 100 MCG/2ML IJ SOLN
INTRAMUSCULAR | Status: AC | PRN
  Administered 2022-07-28: 25 ug via INTRAVENOUS

## 2022-07-28 MED ORDER — SODIUM CHLORIDE 0.9 % IV SOLN: INTRAVENOUS | Status: DC

## 2022-07-28 MED ORDER — SODIUM CHLORIDE 0.9 % IV SOLN
INTRAVENOUS | Status: AC
  Filled 2022-07-28: qty 250

## 2022-07-28 MED ORDER — HYDRALAZINE HCL 20 MG/ML IJ SOLN
INTRAMUSCULAR | Status: AC
  Filled 2022-07-28: qty 1

## 2022-07-28 NOTE — Progress Notes (Signed)
1037 Lab called reported critical platelet count of 28.

## 2022-07-28 NOTE — Progress Notes (Signed)
1100 Paged again for critical 28 platelet count for IR procedure.Marland Kitchen

## 2022-07-28 NOTE — Progress Notes (Signed)
1043 Paged Stacie Glaze to report critical platelet count of 28.

## 2022-07-28 NOTE — Sedation Documentation (Signed)
Sample sent with lab and collected by Dr. Annamaria Boots

## 2022-07-28 NOTE — H&P (Signed)
Referring Physician(s): Feng,Yan  Supervising Physician: Daryll Brod  Patient Status:  [facility]  Chief Complaint:  "I'm getting a bone marrow biopsy"  Subjective: Patient familiar to our service from right MCA aneurysm endovascular treatment in 2017, follow-up cerebral angiogram in 2020, PTC with biliary drain placement on 07/22/2021 followed by wall flex biliary stent placement on 09/20/2021.  He has a history of pancreatic cancer diagnosed in 2022 with prior Whipple procedure. He presents now with newly worsening thrombocytopenia and is scheduled today for CT-guided bone marrow biopsy for further evaluation.  Past medical history also includes anxiety, depression, arthritis, hyperlipidemia, hypertension, hypothyroidism, sleep apnea, thoracic aortic aneurysm.  He currently denies fever, chest pain, cough, abdominal pain, back pain, vomiting or bleeding.  He does have a mild headache, some chronic dyspnea and intermittent nausea.   Past Medical History:  Diagnosis Date   Aneurysm ([place])    2017   Anxiety    Arthritis    Depression    Family history of breast cancer    Family history of lung cancer    Headache    HLD (hyperlipidemia)    Hx of inguinal hernia repair    Hypertension    Hypothyroidism    Overactive bladder    Pancreatic cancer ([facility])    Pneumonia    Shingles    Sleep apnea    Patient denies   Thoracic ascending aortic aneurysm ([place])    mildly dilated 4.0cm per 08/12/21 CT   Thyroid disease    Varicose veins of both lower extremities    Past Surgical History:  Procedure Laterality Date   BILATERAL CARPAL TUNNEL RELEASE     CATARACT EXTRACTION, BILATERAL Bilateral    ELBOW SURGERY Bilateral    ENDOSCOPIC RETROGRADE CHOLANGIOPANCREATOGRAPHY (ERCP) WITH PROPOFOL N/A 07/20/2021   Procedure: ENDOSCOPIC RETROGRADE CHOLANGIOPANCREATOGRAPHY (ERCP) WITH PROPOFOL;  Surgeon: Gatha Mayer, MD;  Location: [facility] ENDOSCOPY;  Service: Endoscopy;  Laterality: N/A;    ESOPHAGOGASTRODUODENOSCOPY (EGD) WITH PROPOFOL N/A 08/05/2021   Procedure: ESOPHAGOGASTRODUODENOSCOPY (EGD) WITH PROPOFOL;  Surgeon: Rush Landmark Telford Nab., MD;  Location: [place];  Service: Gastroenterology;  Laterality: N/A;   EUS N/A 08/05/2021   Procedure: UPPER ENDOSCOPIC ULTRASOUND (EUS) RADIAL;  Surgeon: Irving Copas., MD;  Location: [place];  Service: Gastroenterology;  Laterality: N/A;   EYE SURGERY     bilateral cataracts   FINE NEEDLE ASPIRATION  08/05/2021   Procedure: FINE NEEDLE ASPIRATION (FNA) LINEAR;  Surgeon: Irving Copas., MD;  Location: [place];  Service: Gastroenterology;;   INGUINAL HERNIA REPAIR Right 1952   IR ANGIO INTRA EXTRACRAN SEL COM CAROTID INNOMINATE BILAT MOD SED  02/20/2019   IR BILIARY STENT(S) EXISTING ACCESS INC DILATION CATH EXCHANGE  09/20/2021   IR ENDOLUMINAL BX OF BILIARY TREE  07/22/2021   IR GENERIC HISTORICAL  10/28/2016   IR RADIOLOGIST EVAL & MGMT 10/28/2016 [facility]   IR INT EXT BILIARY DRAIN WITH CHOLANGIOGRAM  07/22/2021   JOINT REPLACEMENT     LAPAROSCOPY N/A 11/15/2021   Procedure: LAPAROSCOPIC DIAGNOSTIC STAGING;  Surgeon: Dwan Bolt, MD;  Location: [place];  Service: General;  Laterality: N/A;   PORTACATH PLACEMENT N/A 08/19/2021   Procedure: INSERTION PORT-A-CATH;  Surgeon: Dwan Bolt, MD;  Location: [facility];  Service: General;  Laterality: N/A;  LMA   RADIOLOGY WITH ANESTHESIA N/A 10/21/2015   Procedure: RADIOLOGY WITH ANESTHESIA;  Surgeon: Luanne Bras, MD;  Location: [place];  Service: Radiology;  Laterality: N/A;   RADIOLOGY WITH ANESTHESIA  N/A 02/20/2019   Procedure: [employer];  Surgeon: Luanne Bras, MD;  Location: [place];  Service: Radiology;  Laterality: N/A;   ROTATOR CUFF REPAIR Bilateral    VARICOSE VEIN SURGERY     WHIPPLE PROCEDURE N/A 11/15/2021   Procedure: WHIPPLE PROCEDURE;  Surgeon: Dwan Bolt, MD;  Location: [place];  Service: General;  Laterality: N/A;       Allergies: Patient has no known allergies.  Medications: Prior to Admission medications   Medication Sig Start Date End Date Taking? Authorizing Provider  acetaminophen (TYLENOL) 500 MG tablet Take 1 tablet (500 mg total) by mouth every 6 (six) hours as needed for mild pain. 11/22/21   Dwan Bolt, MD  Cholecalciferol (VITAMIN D3) 50 MCG (2000 UT) TABS Take 2,000 Units by mouth daily.    [provider]  dexamethasone (DECADRON) 4 MG tablet Take 8 mg by mouth daily.    [provider]  docusate sodium (COLACE) 100 MG capsule Take 1 capsule (100 mg total) by mouth 2 (two) times daily. Patient not taking: Reported on 06/29/2022 11/22/21   Dwan Bolt, MD  escitalopram (LEXAPRO) 20 MG tablet Take 1 tablet (20 mg total) by mouth daily. 11/08/21   Ronnell Freshwater, NP  famotidine (PEPCID) 20 MG tablet Take 1 tablet (20 mg total) by mouth 2 (two) times daily. 08/06/21   Mansouraty, Telford Nab., MD  ferrous sulfate 325 (65 FE) MG EC tablet Take 1 tablet (325 mg total) by mouth daily with breakfast. Patient not taking: Reported on 06/29/2022 01/20/22   Lincoln Brigham, PA-C  levothyroxine (SYNTHROID) 112 MCG tablet TAKE 1 TABLET (112 MCG TOTAL) BY MOUTH DAILY BEFORE BREAKFAST. 03/28/22   Ronnell Freshwater, NP  lipase/protease/amylase (CREON) 36000 UNITS CPEP capsule Take 1 capsule (36,000 Units total) by mouth 3 (three) times daily before meals. 05/20/22   Truitt Merle, MD  lovastatin (MEVACOR) 20 MG tablet Take 1 tablet (20 mg total) by mouth at bedtime. 05/18/22   Ronnell Freshwater, NP  mirtazapine (REMERON) 7.5 MG tablet Take 1 tablet (7.5 mg total) by mouth at bedtime. 05/18/22   Ronnell Freshwater, NP  Multiple Vitamin (MULTIVITAMIN WITH MINERALS) TABS tablet Take 1 tablet by mouth daily. 09/01/21   Swayze, Ava, DO  nystatin-diphenhydrAMINE-alum & mag hydroxide-simeth Take 5 MLs by mouth 3 (three) times daily as needed for mouth pain. 05/05/22   Truitt Merle, MD  pantoprazole  (PROTONIX) 40 MG tablet Take 1 tablet by mouth daily. 05/18/22   Truitt Merle, MD  polyethylene glycol (MIRALAX / GLYCOLAX) 17 g packet Take 17 g by mouth daily as needed for moderate constipation. Patient not taking: Reported on 06/29/2022 11/22/21   Dwan Bolt, MD  prochlorperazine (COMPAZINE) 10 MG tablet Take 1 tablet (10 mg total) by mouth every 6 (six) hours as needed (Nausea or vomiting). 06/09/22   Truitt Merle, MD  vitamin B-12 (CYANOCOBALAMIN) 1000 MCG tablet Take 1,000 mcg by mouth 2 (two) times daily.    [provider]     Vital Signs:pending    Physical Exam awake, alert.  Chest with diminished breath sounds bases.  Heart with regular rate and rhythm.  Abdomen soft, positive bowel sounds, nontender.  Some pretibial edema noted bilaterally.  Imaging: NM PET Image Initial (PI) Skull Base To Thigh  Result Date: 07/27/2022 CLINICAL DATA:  Subsequent treatment strategy for pancreatic cancer. EXAM: NUCLEAR MEDICINE PET SKULL BASE TO THIGH TECHNIQUE: 6.6 mCi F-18 FDG was injected intravenously. Full-ring PET imaging  was performed from the skull base to thigh after the radiotracer. CT data was obtained and used for attenuation correction and anatomic localization. Fasting blood glucose: 90 mg/dl COMPARISON:  05/03/2022 CT scan FINDINGS: Mediastinal blood pool activity: SUV max 1.5 Liver activity: SUV max NA NECK: No significant abnormal hypermetabolic activity in this region. Incidental CT findings: Right MCA stent observed intracranially. Chronic left maxillary sinusitis. Bilateral common carotid atherosclerotic calcification. CHEST: No significant abnormal hypermetabolic activity in this region. Incidental CT findings: Right Port-A-Cath tip: SVC. Coronary, aortic arch, and branch vessel atherosclerotic vascular disease. Moderate bilateral pleural effusions. ABDOMEN/PELVIS: Anterior to the abdominal aorta just below the level of the left renal vein, a 0.8 cm focus of activity  suspicious for a hypermetabolic lymph node has an SUV of 7.3. Incidental CT findings: Prior Whipple procedure with indistinct tissue planes is in the vicinity of the resected portion of the pancreatic head. No hypermetabolic liver lesion is currently identified. Atherosclerosis is present, including aortoiliac atherosclerotic disease. There is atheromatous plaque proximally in the SMA and along the origin of the celiac trunk. Borderline wall thickening in the urinary bladder, cannot exclude cystitis. Mild diffuse subcutaneous edema. SKELETON: No significant abnormal hypermetabolic activity in this region. Incidental CT findings: Degenerative glenohumeral arthropathy bilaterally. Degenerative hip arthropathy bilaterally. Cervical, thoracic, lumbar spondylosis. IMPRESSION: 1. 0.8 cm focus of accentuated activity (maximum SUV 7.3) along the anterior margin of the abdominal aorta just below the level of the left renal vein, suspicious for a small hypermetabolic lymph node. No other sites of hypermetabolic activity to suggest residual/recurrent malignancy identified. 2. Prior Whipple procedure with indistinct tissue planes in the vicinity of the resected portion of the pancreatic head. 3. Moderate bilateral pleural effusions. Mild diffuse subcutaneous edema. 4. Chronic left maxillary sinusitis. 5. Chronic right MCA stent. 6. Borderline wall thickening in the urinary bladder, cannot exclude cystitis. 7. Multilevel degenerative findings in the spine, shoulders, and hips. 8. Aortic atherosclerosis. Aortic Atherosclerosis (ICD10-I70.0). Electronically Signed   By: Van Clines M.D.   On: 07/27/2022 15:53    Labs:  CBC: Recent Labs    06/16/22 1229 07/15/22 1052 07/21/22 1303 07/22/22 0909 07/27/22 1029  WBC 1.6* 4.6 4.5  --  4.3  HGB 7.7* 8.7* 8.6*  --  8.9*  HCT 23.1* 26.7* 25.8*  --  27.3*  PLT 368 58* 19* 46* 34*    COAGS: Recent Labs    08/27/21 2206 09/20/21 0733 07/22/22 0909  INR 1.3*  1.3* 1.3*  APTT 32  --  32    BMP: Recent Labs    06/16/22 1229 07/15/22 1052 07/21/22 1303 07/27/22 1029  NA 136 139 141 142  K 4.1 3.8 3.7 3.9  CL 102 107 108 107  CO2 _0 32  GLUCOSE 110* 91 76 89  BUN _1 CALCIUM 8.4* 8.5* 8.4* 8.7*  CREATININE 1.24 1.03 1.01 0.89  GFRNONAA 58* >60 >60 >60    LIVER FUNCTION TESTS: Recent Labs    06/16/22 1229 07/15/22 1052 07/21/22 1303 07/27/22 1029  BILITOT 0.6 0.7 0.7 0.6  AST 27 31 34 34  ALT _2 ALKPHOS 69 99 92 82  PROT 5.6* 5.5* 5.2* 5.2*  ALBUMIN 3.2* 3.1* 2.9* 3.0*    Assessment and Plan: Patient familiar to our service from right MCA aneurysm endovascular treatment in 2017, follow-up cerebral angiogram in 2020, PTC with biliary drain placement on 07/22/2021 followed by wall flex biliary stent placement on 09/20/2021.  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 He presents now with newly worsening thrombocytopenia and is scheduled today for CT-guided bone marrow biopsy for further evaluation.  Past medical history also includes anxiety, depression, arthritis, hyperlipidemia, hypertension, hypothyroidism, sleep apnea, thoracic aortic aneurysm. Risks and benefits of procedure was discussed with the patient  including, but not limited to bleeding, infection, damage to adjacent structures or low yield requiring additional tests.  All of the questions were answered and there is agreement to proceed.  Consent signed and in chart.  Pt ate breakfast this am at 0800, therefore single agent sedation will be used  Electronically Signed: D. Rowe Robert, PA-C 07/28/2022, 9:11 AM   I spent a total of 20 minutes at the the patient's bedside AND on the patient's hospital floor or unit, greater than 50% of which was counseling/coordinating care for CT guided bone marrow biopsy

## 2022-07-28 NOTE — Discharge Instructions (Signed)
Discharge Instructions:   Please call Interventional Radiology clinic [phone] with any questions or concerns.  You may remove your dressing and shower tomorrow.  Moderate Conscious Sedation, Adult, Care After This sheet gives you information about how to care for yourself after your procedure. Your health care provider may also give you more specific instructions. If you have problems or questions, contact your health care provider. What can I expect after the procedure? After the procedure, it is common to have: Sleepiness for several hours. Impaired judgment for several hours. Difficulty with balance. Vomiting if you eat too soon. Follow these instructions at home: For the time period you were told by your health care provider: Rest. Do not participate in activities where you could fall or become injured. Do not drive or use machinery. Do not drink alcohol. Do not take sleeping pills or medicines that cause drowsiness. Do not make important decisions or sign legal documents. Do not take care of children on your own. Eating and drinking  Follow the diet recommended by your health care provider. Drink enough fluid to keep your urine pale yellow. If you vomit: Drink water, juice, or soup when you can drink without vomiting. Make sure you have little or no nausea before eating solid foods. General instructions Take over-the-counter and prescription medicines only as told by your health care provider. Have a responsible adult stay with you for the time you are told. It is important to have someone help care for you until you are awake and alert. Do not smoke. Keep all follow-up visits as told by your health care provider. This is important. Contact a health care provider if: You are still sleepy or having trouble with balance after 24 hours. You feel light-headed. You keep feeling nauseous or you keep vomiting. You develop a rash. You have a fever. You have redness or  swelling around the IV site. Get help right away if: You have trouble breathing. You have new-onset confusion at home. Summary After the procedure, it is common to feel sleepy, have impaired judgment, or feel nauseous if you eat too soon. Rest after you get home. Know the things you should not do after the procedure. Follow the diet recommended by your health care provider and drink enough fluid to keep your urine pale yellow. Get help right away if you have trouble breathing or new-onset confusion at home. This information is not intended to replace advice given to you by your health care provider. Make sure you discuss any questions you have with your health care provider. Document Revised: 11/29/2019 Document Reviewed: 06/27/2019 Elsevier Patient Education  2023 Elsevier Inc.    Bone Marrow Aspiration and Bone Marrow Biopsy, Adult, Care After This sheet gives you information about how to care for yourself after your procedure. Your health care provider may also give you more specific instructions. If you have problems or questions, contact your health care provider. What can I expect after the procedure? After the procedure, it is common to have: Mild pain and tenderness. Swelling. Bruising. Follow these instructions at home: Puncture site care  Follow instructions from your health care provider about how to take care of the puncture site. Make sure you: Wash your hands with soap and water before and after you change your bandage (dressing). If soap and water are not available, use hand sanitizer. Change your dressing as told by your health care provider. Check your puncture site every day for signs of infection. Check for: More redness, swelling, or pain.   Fluid or blood. Warmth. Pus or a bad smell. Activity Return to your normal activities as told by your health care provider. Ask your health care provider what activities are safe for you. Do not lift anything that is heavier  than 10 lb (4.5 kg), or the limit that you are told, until your health care provider says that it is safe. Do not drive for 24 hours if you were given a sedative during your procedure. General instructions  Take over-the-counter and prescription medicines only as told by your health care provider. Do not take baths, swim, or use a hot tub until your health care provider approves. Ask your health care provider if you may take showers. You may only be allowed to take sponge baths. If directed, put ice on the affected area. To do this: Put ice in a plastic bag. Place a towel between your skin and the bag. Leave the ice on for 20 minutes, 2-3 times a day. Keep all follow-up visits as told by your health care provider. This is important. Contact a health care provider if: Your pain is not controlled with medicine. You have a fever. You have more redness, swelling, or pain around the puncture site. You have fluid or blood coming from the puncture site. Your puncture site feels warm to the touch. You have pus or a bad smell coming from the puncture site. Summary After the procedure, it is common to have mild pain, tenderness, swelling, and bruising. Follow instructions from your health care provider about how to take care of the puncture site and what activities are safe for you. Take over-the-counter and prescription medicines only as told by your health care provider. Contact a health care provider if you have any signs of infection, such as fluid or blood coming from the puncture site. This information is not intended to replace advice given to you by your health care provider. Make sure you discuss any questions you have with your health care provider. Document Revised: 12/18/2018 Document Reviewed: 12/18/2018 Elsevier Patient Education  2023 Elsevier Inc. 

## 2022-07-28 NOTE — Procedures (Signed)
Interventional Radiology Procedure Note  Procedure: CT BM ASP AND CORE    Complications: None  Estimated Blood Loss:  MIN  Findings: 11 G CORE AND ASP    M. TREVOR Adelisa Satterwhite, MD    

## 2022-08-01 ENCOUNTER — Inpatient Hospital Stay: Payer: Medicare HMO

## 2022-08-01 ENCOUNTER — Inpatient Hospital Stay: Payer: Medicare HMO | Admitting: Hematology

## 2022-08-01 ENCOUNTER — Telehealth: Payer: Self-pay

## 2022-08-01 NOTE — Telephone Encounter (Signed)
Left message for patient to call and discuss rescheduling the Echocardiogram ordered by Dr. Devona Konig

## 2022-08-01 NOTE — Progress Notes (Unsigned)
[facility]   [phone] Fax:([phone]) [phone]   Clinic Follow up Note   Patient Care Team: Ronnell Freshwater, NP as PCP - General (Family Medicine) Buford Dresser, MD as PCP - Cardiology (Cardiology) Truitt Merle, MD as Consulting Physician (Oncology) Royston Bake, RN as Oncology Nurse Navigator (Oncology)  Date of Service:  08/02/2022  CHIEF COMPLAINT: f/u of  pancreatic cancer    CURRENT THERAPY:   Gemcitabine, days 1 and 8 q21d, starting 01/20/22     ASSESSMENT:  Angel Dunton. is a 81 y.o. male with   MDS (myelodysplastic syndrome) ([place]) -developed worsening thrombocytopenia in December 2023, required a platelet transfusion.  -Bone marrow seen on July 28, 2022 showed hypercellular marrow, megakaryocytes dyspoiesis, for MDS.  Biopsy was negative for metastatic adenocarcinoma.  -I reviewed the bone marrow biopsy results with patient and his daughter in detail today, cytogenetics and MDS FISH panel results are still pending. -given the worsening thrombocytopenia, I recommend him to try weekly Nplate injection first, will start at 46mg/kg and titrated dose as needed.  Benefit and potential side effect discussed with patient, he agrees to proceed. -Depending on the cytogenetics and MDS FISH results, may recommend chemotherapy with azacitidine if he has high risk MDS.   [place] cT2N0M0, stage IB, ypT2N0  -diagnosed in 07/2021 -received neoadjuvant chemo gemcitabine alone on 08/20/21. Abraxane was added with cycle 2 (09/17/21), but he tolerated poorly and discontinued after first dose.  --s/p whipple procedure on 11/15/21 with Dr. 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 Margins and lymph nodes negative. -he restarted adjuvant gemcitabine on 01/20/22, completed 06/18/2022 -recent CT 05/03/2022 showed NED -Due to his recent thrombocytopenia, he underwent a bone marrow biopsy last  week, which was negative for metastatic cancer. -Scheduled for repeated CT scan in early January.  Fever -He had a fever at home yesterday, with productive cough.  This was negative at home yesterday. -Obtain chest x-ray today -Augmentin 8783mbid for 7 days  -his oxygen saturation remains normal when he walked in the office.   PLAN: -Discuss bone marrow biopsy results, I recommend him to start Nplate weekly late this week, will get insurance approval first -I called in Augmentin antibiotic  -order chest x-ray -lab and injection weekly -f/u in 2 weeks    SUMMARY OF ONCOLOGIC HISTORY: Oncology History Overview Note   Cancer Staging  [facility]: Exocrine Pancreas, AJCC 8th Edition - Clinical stage from 08/05/2021: Stage IB (cT2, cN0, cM0) - Signed by FeTruitt MerleMD on 08/17/2021 Stage prefix: Initial diagnosis Total positive nodes: 0     Pancreatic mass  07/18/2021 Initial Diagnosis   Pancreatic mass   07/18/2021 Imaging   CT Abdomen Pelvis W Contrast  IMPRESSION: 1. Suspected solid mass within the proximal body of the pancreas measures 2.3 x 2.6 x 2.0 cm. There is downstream dilation of the main pancreatic duct and its branches. There is also intra and extrahepatic biliary ductal dilation. Further evaluation with MRI of the abdomen, pancreatic protocol, when clinically feasible, may be considered. 2. 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 3. Aortic atherosclerosis   07/20/2021 Procedure   DG ERCP  IMPRESSION: Nondiagnostic ERCP as above. Correlation with the operative report is advised.  - The examination was suspicious for a biliary stricture in the bile duct. - Examination was suspicious for carcinoma of the head of the pancreas. - Attempts at a cholangiogram failed.  07/22/2021 Pathology Results   CASE: [id number]   FINAL MICROSCOPIC DIAGNOSIS:  - Suspicious for malignancy  - See comment   DIAGNOSTIC  COMMENTS:  There are rare cells with cytologic atypia suspicious for  adenocarcinoma.  Dr. Vic Ripper agrees.    07/22/2021 Procedure   IR INT EXT BILIARY DRAIN WITH CHOLANGIOGRAM ( IMPRESSION: 1. 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 2. Successful brush biopsy of obstructing lesion x3. 3. Successful placement of a 10 French internal/external biliary drainage catheter.   PLAN: 1. Follow bilirubin. When significant downward trend is clear, the bag should be capped. Recommend capping bag before discharge home if possible. 2. Follow-up in IR in 4-6 weeks for initial biliary tube check and exchange. If initial brush biopsies are negative, repeat biopsy could be considered at that time.   Pancreatic cancer ([place])  08/05/2021 Cancer Staging   Staging form: Exocrine Pancreas, AJCC 8th Edition - Clinical stage from 08/05/2021: Stage IB (cT2, cN0, cM0) - Signed by Truitt Merle, MD on 08/17/2021 Stage prefix: Initial diagnosis Total positive nodes: 0   08/14/2021 Initial Diagnosis   Pancreatic cancer ([place])   08/20/2021 - 03/23/2022 Chemotherapy   Patient is on Treatment Plan : PANCREATIC Abraxane / Gemcitabine D1,8 q21d     08/20/2021 -  Chemotherapy   Patient is on Treatment Plan : PANCREAS Gemcitabine D1,8 (1000) q21d x 8 Cycles     11/15/2021 Cancer Staging   Staging form: Exocrine Pancreas, AJCC 8th Edition - Pathologic stage from 11/15/2021: Stage IB (ypT2, pN0, cM0) - Signed by Truitt Merle, MD on 01/03/2022 Stage prefix: Post-therapy Total positive nodes: 0 Histologic grade (G): G3 Histologic grading system: 3 grade system Residual tumor (R): R0 - None      INTERVAL HISTORY:  Angel JASMIN Sr. is here for a follow up of  pancreatic cancer He was last seen by me on 07/22/22 He presents to the clinic accompanied by daughter. Pt report of SOB. Pt states having congestion.   All other systems were reviewed with the patient and  are negative.  MEDICAL HISTORY:  Past Medical History:  Diagnosis Date   Aneurysm ([place])    2017   Anxiety    Arthritis    Depression    Family history of breast cancer    Family history of lung cancer    Headache    HLD (hyperlipidemia)    Hx of inguinal hernia repair    Hypertension    Hypothyroidism    Overactive bladder    Pancreatic cancer ([facility])    Pneumonia    Shingles    Sleep apnea    Patient denies   Thoracic ascending aortic aneurysm ([place])    mildly dilated 4.0cm per 08/12/21 CT   Thyroid disease    Varicose veins of both lower extremities     SURGICAL HISTORY: Past Surgical History:  Procedure Laterality Date   BILATERAL CARPAL TUNNEL RELEASE     CATARACT EXTRACTION, BILATERAL Bilateral    ELBOW SURGERY Bilateral    ENDOSCOPIC RETROGRADE CHOLANGIOPANCREATOGRAPHY (ERCP) WITH PROPOFOL N/A 07/20/2021   Procedure: ENDOSCOPIC RETROGRADE CHOLANGIOPANCREATOGRAPHY (ERCP) WITH PROPOFOL;  Surgeon: Gatha Mayer, MD;  Location: [facility] ENDOSCOPY;  Service: Endoscopy;  Laterality: N/A;   ESOPHAGOGASTRODUODENOSCOPY (EGD) WITH PROPOFOL N/A 08/05/2021   Procedure: ESOPHAGOGASTRODUODENOSCOPY (EGD) WITH PROPOFOL;  Surgeon: Rush Landmark Telford Nab., MD;  Location: [place];  Service: Gastroenterology;  Laterality: N/A;   EUS N/A 08/05/2021   Procedure: UPPER ENDOSCOPIC ULTRASOUND (EUS) RADIAL;  Surgeon: Irving Copas., MD;  Location: [place];  Service: Gastroenterology;  Laterality: N/A;   EYE SURGERY     bilateral cataracts   FINE NEEDLE ASPIRATION  08/05/2021   Procedure: FINE NEEDLE ASPIRATION (FNA) LINEAR;  Surgeon: Irving Copas., MD;  Location: [place];  Service: Gastroenterology;;   INGUINAL HERNIA REPAIR Right 1951   IR ANGIO INTRA EXTRACRAN SEL COM CAROTID INNOMINATE BILAT MOD SED  02/20/2019   IR BILIARY STENT(S) EXISTING ACCESS INC DILATION CATH EXCHANGE  09/20/2021   IR ENDOLUMINAL BX OF BILIARY TREE  07/22/2021   IR GENERIC HISTORICAL   10/28/2016   IR RADIOLOGIST EVAL & MGMT 10/28/2016 [facility]   IR INT EXT BILIARY DRAIN WITH CHOLANGIOGRAM  07/22/2021   JOINT REPLACEMENT     LAPAROSCOPY N/A 11/15/2021   Procedure: LAPAROSCOPIC DIAGNOSTIC STAGING;  Surgeon: Dwan Bolt, MD;  Location: [place];  Service: General;  Laterality: N/A;   PORTACATH PLACEMENT N/A 08/19/2021   Procedure: INSERTION PORT-A-CATH;  Surgeon: Dwan Bolt, MD;  Location: [facility];  Service: General;  Laterality: N/A;  LMA   RADIOLOGY WITH ANESTHESIA N/A 10/21/2015   Procedure: RADIOLOGY WITH ANESTHESIA;  Surgeon: Luanne Bras, MD;  Location: [place];  Service: Radiology;  Laterality: N/A;   RADIOLOGY WITH ANESTHESIA N/A 02/20/2019   Procedure: [employer];  Surgeon: Luanne Bras, MD;  Location: [place];  Service: Radiology;  Laterality: N/A;   ROTATOR CUFF REPAIR Bilateral    VARICOSE VEIN SURGERY     WHIPPLE PROCEDURE N/A 11/15/2021   Procedure: WHIPPLE PROCEDURE;  Surgeon: Dwan Bolt, MD;  Location: [place];  Service: General;  Laterality: N/A;    I have reviewed the social history and family history with the patient and they are unchanged from previous note.  ALLERGIES:  has No Known Allergies.  MEDICATIONS:  Current Outpatient Medications  Medication Sig Dispense Refill   amoxicillin-clavulanate (AUGMENTIN) 875-125 MG tablet Take 1 tablet by mouth 2 (two) times daily. 14 tablet 0   acetaminophen (TYLENOL) 500 MG tablet Take 1 tablet (500 mg total) by mouth every 6 (six) hours as needed for mild pain. 30 tablet 0   Cholecalciferol (VITAMIN D3) 50 MCG (2000 UT) TABS Take 2,000 Units by mouth daily.     dexamethasone (DECADRON) 4 MG tablet Take 8 mg by mouth daily.     docusate sodium (COLACE) 100 MG capsule Take 1 capsule (100 mg total) by mouth 2 (two) times daily. (Patient not taking: Reported on 06/29/2022) 28 capsule 0   escitalopram (LEXAPRO) 20 MG tablet Take 1 tablet (20 mg total) by mouth daily. 90 tablet 1   famotidine  (PEPCID) 20 MG tablet Take 1 tablet (20 mg total) by mouth 2 (two) times daily. 60 tablet 1   ferrous sulfate 325 (65 FE) MG EC tablet Take 1 tablet (325 mg total) by mouth daily with breakfast. (Patient not taking: Reported on 06/29/2022) 30 tablet 3   levothyroxine (SYNTHROID) 112 MCG tablet TAKE 1 TABLET (112 MCG TOTAL) BY MOUTH DAILY BEFORE BREAKFAST. 90 tablet 1   lipase/protease/amylase (CREON) 36000 UNITS CPEP capsule Take 1 capsule (36,000 Units total) by mouth 3 (three) times daily before meals. 180 capsule 0   lovastatin (MEVACOR) 20 MG tablet Take 1 tablet (20 mg total) by mouth at bedtime. 90 tablet 0   mirtazapine (REMERON) 7.5 MG tablet Take 1 tablet (7.5 mg total) by mouth at bedtime. 90 tablet 0   Multiple Vitamin (MULTIVITAMIN WITH MINERALS) TABS tablet Take  1 tablet by mouth daily. 30 tablet 0   nystatin-diphenhydrAMINE-alum & mag hydroxide-simeth Take 5 MLs by mouth 3 (three) times daily as needed for mouth pain. 120 mL 1   pantoprazole (PROTONIX) 40 MG tablet Take 1 tablet by mouth daily. 90 tablet 0   polyethylene glycol (MIRALAX / GLYCOLAX) 17 g packet Take 17 g by mouth daily as needed for moderate constipation. (Patient not taking: Reported on 06/29/2022) 14 each 0   prochlorperazine (COMPAZINE) 10 MG tablet Take 1 tablet (10 mg total) by mouth every 6 (six) hours as needed (Nausea or vomiting). 30 tablet 1   vitamin B-12 (CYANOCOBALAMIN) 1000 MCG tablet Take 1,000 mcg by mouth 2 (two) times daily.     No current facility-administered medications for this visit.    PHYSICAL EXAMINATION: ECOG PERFORMANCE STATUS: 2 - Symptomatic, <50% confined to bed  Vitals:   08/02/22 1133  BP: (!) 154/80  Pulse: 79  Resp: 18  Temp: 99.3 F (37.4 C)  SpO2: 95%   Wt Readings from Last 3 Encounters:  07/28/22 132 lb 7.9 oz (60.1 kg)  07/22/22 132 lb 9.6 oz (60.1 kg)  07/15/22 128 lb 8 oz (58.3 kg)    GENERAL:alert, no distress and comfortable SKIN: skin color, texture, turgor  are normal, no rashes or significant lesions EYES: normal, Conjunctiva are pink and non-injected, sclera clear  NECK: supple, thyroid normal size, non-tender, without nodularity LYMPH:  no palpable lymphadenopathy in the cervical, axillary  LUNGS: clear to auscultation and percussion with normal breathing effort HEART: regular rate & rhythm and no murmurs and no lower extremity edema ABDOMEN( -) :abdomen soft, non-tender and normal bowel sounds Musculoskeletal:no cyanosis of digits and no clubbing  NEURO: alert & oriented x 3 with fluent speech, no focal motor/sensory deficits  LABORATORY DATA:  I have reviewed the data as listed    Latest Ref Rng & Units 08/02/2022   10:56 AM 07/28/2022    9:50 AM 07/27/2022   10:29 AM  CBC  WBC 4.0 - 10.5 K/uL 5.6  4.1  4.3   Hemoglobin 13.0 - 17.0 g/dL 9.0  8.2  8.9   Hematocrit 39.0 - 52.0 % 27.1  26.7  27.3   Platelets 150 - 400 K/uL 23  28  34         Latest Ref Rng & Units 08/02/2022   10:56 AM 07/27/2022   10:29 AM 07/21/2022    1:03 PM  CMP  Glucose 70 - 99 mg/dL 160  89  76   BUN 8 - 23 mg/dL _0 Creatinine 0.61 - 1.24 mg/dL 1.23  0.89  1.01   Sodium 135 - 145 mmol/L 135  142  141   Potassium 3.5 - 5.1 mmol/L 3.4  3.9  3.7   Chloride 98 - 111 mmol/L 100  107  108   CO2 22 - 32 mmol/L 27  32  28   Calcium 8.9 - 10.3 mg/dL 8.0  8.7  8.4   Total Protein 6.5 - 8.1 g/dL 5.7  5.2  5.2   Total Bilirubin 0.3 - 1.2 mg/dL 0.9  0.6  0.7   Alkaline Phos 38 - 126 U/L 72  82  92   AST 15 - 41 U/L 31  34  34   ALT 0 - 44 U/L _1 RADIOGRAPHIC STUDIES: I have personally reviewed the radiological images as listed and agreed with the  findings in the report. DG Chest 2 View  Result Date: 08/02/2022 CLINICAL DATA:  Chest congestion and elevated temperature. History of pancreatic cancer. EXAM: CHEST - 2 VIEW COMPARISON:  AP chest 08/27/2021, 08/19/2021; chest two views 07/18/2021; CT chest 05/03/2022 FINDINGS: Right chest  wall porta catheter is again seen with tip overlying the central superior vena cava. Cardiac silhouette is at the upper limits of normal size. Mildly tortuous thoracic aorta is again seen. Mild calcification within aortic arch. The lungs are clear.  No pleural effusion or pneumothorax. Moderate multilevel degenerative disc changes of the upper thoracic spine, similar to prior. IMPRESSION: No active cardiopulmonary disease. Electronically Signed   By: Yvonne Kendall M.D.   On: 08/02/2022 13:00      No orders of the defined types were placed in this encounter.  All questions were answered. The patient knows to call the clinic with any problems, questions or concerns. No barriers to learning was detected. The total time spent in the appointment was 30 minutes.     Truitt Merle, MD 08/02/2022   Felicity Coyer, CMA, am acting as scribe for Truitt Merle, MD.   I have reviewed the above documentation for accuracy and completeness, and I agree with the above.

## 2022-08-01 NOTE — Telephone Encounter (Signed)
LVM for primary telephone stating that Dr. Burr Medico would like to reschedule pt's appt today 08/01/2022 '@1420'$  to Tuesday 08/02/2022 '@1120'$ .  Instructed pt to call Dr. Ernestina Penna office to reschedule appt.  Awaiting return call.

## 2022-08-02 ENCOUNTER — Ambulatory Visit (HOSPITAL_COMMUNITY)
Admission: RE | Admit: 2022-08-02 | Discharge: 2022-08-02 | Disposition: A | Discharge status: Home / Self Care | Payer: Medicare HMO | Source: Ambulatory Visit | Attending: Hematology | Admitting: Hematology

## 2022-08-02 ENCOUNTER — Inpatient Hospital Stay: Payer: Medicare HMO

## 2022-08-02 ENCOUNTER — Other Ambulatory Visit: Payer: Self-pay

## 2022-08-02 ENCOUNTER — Other Ambulatory Visit: Payer: Medicare HMO

## 2022-08-02 ENCOUNTER — Encounter: Payer: Self-pay | Admitting: Hematology

## 2022-08-02 ENCOUNTER — Inpatient Hospital Stay (HOSPITAL_BASED_OUTPATIENT_CLINIC_OR_DEPARTMENT_OTHER): Payer: Medicare HMO | Admitting: Hematology

## 2022-08-02 VITALS — BP 154/80 | HR 79 | Temp 99.3°F | Resp 18

## 2022-08-02 DIAGNOSIS — Z95828 Presence of other vascular implants and grafts: Secondary | ICD-10-CM

## 2022-08-02 DIAGNOSIS — C250 Malignant neoplasm of head of pancreas: Secondary | ICD-10-CM

## 2022-08-02 DIAGNOSIS — C251 Malignant neoplasm of body of pancreas: Secondary | ICD-10-CM

## 2022-08-02 DIAGNOSIS — R0989 Other specified symptoms and signs involving the circulatory and respiratory systems: Secondary | ICD-10-CM | POA: Diagnosis not present

## 2022-08-02 DIAGNOSIS — R509 Fever, unspecified: Secondary | ICD-10-CM | POA: Diagnosis not present

## 2022-08-02 DIAGNOSIS — B999 Unspecified infectious disease: Secondary | ICD-10-CM | POA: Insufficient documentation

## 2022-08-02 DIAGNOSIS — D469 Myelodysplastic syndrome, unspecified: Secondary | ICD-10-CM | POA: Insufficient documentation

## 2022-08-02 DIAGNOSIS — I771 Stricture of artery: Secondary | ICD-10-CM | POA: Diagnosis not present

## 2022-08-02 DIAGNOSIS — Z8507 Personal history of malignant neoplasm of pancreas: Secondary | ICD-10-CM | POA: Diagnosis not present

## 2022-08-02 DIAGNOSIS — D696 Thrombocytopenia, unspecified: Secondary | ICD-10-CM

## 2022-08-02 LAB — CBC WITH DIFFERENTIAL (CANCER CENTER ONLY)
Abs Immature Granulocytes: 0.02 10*3/uL (ref 0.00–0.07)
Basophils Absolute: 0 10*3/uL (ref 0.0–0.1)
Basophils Relative: 0 %
Eosinophils Absolute: 0 10*3/uL (ref 0.0–0.5)
Eosinophils Relative: 0 %
HCT: 27.1 % — ABNORMAL LOW (ref 39.0–52.0)
Hemoglobin: 9 g/dL — ABNORMAL LOW (ref 13.0–17.0)
Immature Granulocytes: 0 %
Lymphocytes Relative: 4 %
Lymphs Abs: 0.2 10*3/uL — ABNORMAL LOW (ref 0.7–4.0)
MCH: 31 pg (ref 26.0–34.0)
MCHC: 33.2 g/dL (ref 30.0–36.0)
MCV: 93.4 fL (ref 80.0–100.0)
Monocytes Absolute: 0.3 10*3/uL (ref 0.1–1.0)
Monocytes Relative: 6 %
Neutro Abs: 5.1 10*3/uL (ref 1.7–7.7)
Neutrophils Relative %: 90 %
Platelet Count: 23 10*3/uL — ABNORMAL LOW (ref 150–400)
RBC: 2.9 MIL/uL — ABNORMAL LOW (ref 4.22–5.81)
RDW: 15.8 % — ABNORMAL HIGH (ref 11.5–15.5)
WBC Count: 5.6 10*3/uL (ref 4.0–10.5)
nRBC: 0 % (ref 0.0–0.2)

## 2022-08-02 LAB — CMP (CANCER CENTER ONLY)
ALT: 15 U/L (ref 0–44)
AST: 31 U/L (ref 15–41)
Albumin: 2.7 g/dL — ABNORMAL LOW (ref 3.5–5.0)
Alkaline Phosphatase: 72 U/L (ref 38–126)
Anion gap: 8 (ref 5–15)
BUN: 23 mg/dL (ref 8–23)
CO2: 27 mmol/L (ref 22–32)
Calcium: 8 mg/dL — ABNORMAL LOW (ref 8.9–10.3)
Chloride: 100 mmol/L (ref 98–111)
Creatinine: 1.23 mg/dL (ref 0.61–1.24)
GFR, Estimated: 59 mL/min — ABNORMAL LOW (ref >60)
Glucose, Bld: 160 mg/dL — ABNORMAL HIGH (ref 70–99)
Potassium: 3.4 mmol/L — ABNORMAL LOW (ref 3.5–5.1)
Sodium: 135 mmol/L (ref 135–145)
Total Bilirubin: 0.9 mg/dL (ref 0.3–1.2)
Total Protein: 5.7 g/dL — ABNORMAL LOW (ref 6.5–8.1)

## 2022-08-02 LAB — VITAMIN B12: Vitamin B-12: 4109 pg/mL — ABNORMAL HIGH (ref 180–914)

## 2022-08-02 LAB — SAMPLE TO BLOOD BANK

## 2022-08-02 MED ORDER — AMOXICILLIN-POT CLAVULANATE 875-125 MG PO TABS: 1.0000 | ORAL_TABLET | Freq: Two times a day (BID) | ORAL | 0 refills | Status: DC

## 2022-08-02 MED ORDER — SODIUM CHLORIDE 0.9% FLUSH
10.0000 mL | INTRAVENOUS | Status: DC | PRN
  Administered 2022-08-02: 10 mL

## 2022-08-02 NOTE — Assessment & Plan Note (Signed)
cT2N0M0, stage IB, ypT2N0  -diagnosed in 07/2021 -received neoadjuvant chemo gemcitabine alone on 08/20/21. Abraxane was added with cycle 2 (09/17/21), but he tolerated poorly and discontinued after first dose.  --s/p whipple procedure on 11/15/21 with Dr. Zenia Resides. 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 Margins and lymph nodes negative. -he restarted adjuvant gemcitabine on 01/20/22, completed 06/18/2022 -recent CT 05/03/2022 showed NED -Due to his recent thrombocytopenia, he underwent a bone marrow biopsy last week, which was negative for metastatic cancer. -Scheduled for repeated CT scan in early January.

## 2022-08-02 NOTE — Assessment & Plan Note (Signed)
-  developed worsening thrombocytopenia in December 2023, required a platelet transfusion.  -Bone marrow seen on July 28, 2022 showed hypercellular marrow, megakaryocytes dyspoiesis, for MDS.  Biopsy was negative for metastatic adenocarcinoma.  -I reviewed the bone marrow biopsy results with patient and his daughter in detail today, cytogenetics and MDS FISH panel results are still pending. -given the worsening thrombocytopenia, I recommend him to try weekly Nplate injection first, will start at 34mg/kg and titrated dose as needed.  Benefit and potential side effect discussed with patient, he agrees to proceed. -Depending on the cytogenetics and MDS FISH results, may recommend chemotherapy with azacitidine if he has high risk MDS.

## 2022-08-03 ENCOUNTER — Other Ambulatory Visit: Payer: Self-pay

## 2022-08-03 LAB — SURGICAL PATHOLOGY

## 2022-08-05 ENCOUNTER — Encounter (HOSPITAL_COMMUNITY): Payer: Self-pay | Admitting: Hematology

## 2022-08-05 ENCOUNTER — Ambulatory Visit: Payer: Medicare HMO | Admitting: Hematology

## 2022-08-05 ENCOUNTER — Inpatient Hospital Stay: Payer: Medicare HMO

## 2022-08-05 ENCOUNTER — Other Ambulatory Visit: Payer: Self-pay

## 2022-08-09 ENCOUNTER — Encounter (HOSPITAL_COMMUNITY): Payer: Self-pay | Admitting: Hematology

## 2022-08-09 ENCOUNTER — Encounter (HOSPITAL_BASED_OUTPATIENT_CLINIC_OR_DEPARTMENT_OTHER): Payer: Self-pay | Admitting: Cardiology

## 2022-08-09 NOTE — Telephone Encounter (Signed)
Left message for patient to call and discuss rescheduling the Echocardiogram appointment missed on 07/15/22.  Will also mail letter requesting he call

## 2022-08-11 ENCOUNTER — Telehealth: Payer: Self-pay

## 2022-08-11 NOTE — Telephone Encounter (Signed)
Faxed over Bone marrow biopsy results as per [facility] for insurance approval. As per fax

## 2022-08-12 ENCOUNTER — Inpatient Hospital Stay: Payer: Medicare HMO

## 2022-08-12 ENCOUNTER — Encounter: Payer: Self-pay | Admitting: Hematology

## 2022-08-12 VITALS — BP 191/88 | HR 58 | Temp 97.6°F | Resp 20

## 2022-08-12 DIAGNOSIS — E860 Dehydration: Secondary | ICD-10-CM

## 2022-08-12 DIAGNOSIS — Z95828 Presence of other vascular implants and grafts: Secondary | ICD-10-CM

## 2022-08-12 DIAGNOSIS — D469 Myelodysplastic syndrome, unspecified: Secondary | ICD-10-CM | POA: Diagnosis not present

## 2022-08-12 DIAGNOSIS — R112 Nausea with vomiting, unspecified: Secondary | ICD-10-CM

## 2022-08-12 DIAGNOSIS — C251 Malignant neoplasm of body of pancreas: Secondary | ICD-10-CM | POA: Diagnosis not present

## 2022-08-12 DIAGNOSIS — C250 Malignant neoplasm of head of pancreas: Secondary | ICD-10-CM

## 2022-08-12 LAB — CBC WITH DIFFERENTIAL (CANCER CENTER ONLY)
Abs Immature Granulocytes: 0.02 10*3/uL (ref 0.00–0.07)
Basophils Absolute: 0 10*3/uL (ref 0.0–0.1)
Basophils Relative: 0 %
Eosinophils Absolute: 0.1 10*3/uL (ref 0.0–0.5)
Eosinophils Relative: 1 %
HCT: 29.6 % — ABNORMAL LOW (ref 39.0–52.0)
Hemoglobin: 9.8 g/dL — ABNORMAL LOW (ref 13.0–17.0)
Immature Granulocytes: 0 %
Lymphocytes Relative: 40 %
Lymphs Abs: 2.2 10*3/uL (ref 0.7–4.0)
MCH: 30.3 pg (ref 26.0–34.0)
MCHC: 33.1 g/dL (ref 30.0–36.0)
MCV: 91.6 fL (ref 80.0–100.0)
Monocytes Absolute: 0.4 10*3/uL (ref 0.1–1.0)
Monocytes Relative: 8 %
Neutro Abs: 2.8 10*3/uL (ref 1.7–7.7)
Neutrophils Relative %: 51 %
Platelet Count: 145 10*3/uL — ABNORMAL LOW (ref 150–400)
RBC: 3.23 MIL/uL — ABNORMAL LOW (ref 4.22–5.81)
RDW: 15.3 % (ref 11.5–15.5)
WBC Count: 5.5 10*3/uL (ref 4.0–10.5)
nRBC: 0 % (ref 0.0–0.2)

## 2022-08-12 LAB — CMP (CANCER CENTER ONLY)
ALT: 22 U/L (ref 0–44)
AST: 39 U/L (ref 15–41)
Albumin: 3.1 g/dL — ABNORMAL LOW (ref 3.5–5.0)
Alkaline Phosphatase: 70 U/L (ref 38–126)
Anion gap: 3 — ABNORMAL LOW (ref 5–15)
BUN: 17 mg/dL (ref 8–23)
CO2: 31 mmol/L (ref 22–32)
Calcium: 8.8 mg/dL — ABNORMAL LOW (ref 8.9–10.3)
Chloride: 105 mmol/L (ref 98–111)
Creatinine: 1.1 mg/dL (ref 0.61–1.24)
GFR, Estimated: >60 mL/min (ref >60)
Glucose, Bld: 98 mg/dL (ref 70–99)
Potassium: 5 mmol/L (ref 3.5–5.1)
Sodium: 139 mmol/L (ref 135–145)
Total Bilirubin: 0.5 mg/dL (ref 0.3–1.2)
Total Protein: 6.1 g/dL — ABNORMAL LOW (ref 6.5–8.1)

## 2022-08-12 LAB — SAMPLE TO BLOOD BANK

## 2022-08-12 MED ORDER — ROMIPLOSTIM 125 MCG ~~LOC~~ SOLR
120.0000 ug | SUBCUTANEOUS | Status: DC
  Administered 2022-08-12: 120 ug via SUBCUTANEOUS
  Filled 2022-08-12: qty 0.24

## 2022-08-16 ENCOUNTER — Ambulatory Visit (HOSPITAL_COMMUNITY)
Admission: RE | Admit: 2022-08-16 | Discharge: 2022-08-16 | Disposition: A | Discharge status: Home / Self Care | Payer: Medicare HMO | Source: Ambulatory Visit | Attending: Hematology | Admitting: Hematology

## 2022-08-16 DIAGNOSIS — C259 Malignant neoplasm of pancreas, unspecified: Secondary | ICD-10-CM | POA: Diagnosis not present

## 2022-08-16 DIAGNOSIS — N400 Enlarged prostate without lower urinary tract symptoms: Secondary | ICD-10-CM | POA: Diagnosis not present

## 2022-08-16 DIAGNOSIS — C251 Malignant neoplasm of body of pancreas: Secondary | ICD-10-CM | POA: Insufficient documentation

## 2022-08-16 DIAGNOSIS — I2510 Atherosclerotic heart disease of native coronary artery without angina pectoris: Secondary | ICD-10-CM | POA: Diagnosis not present

## 2022-08-16 DIAGNOSIS — N3289 Other specified disorders of bladder: Secondary | ICD-10-CM | POA: Diagnosis not present

## 2022-08-16 DIAGNOSIS — M5134 Other intervertebral disc degeneration, thoracic region: Secondary | ICD-10-CM | POA: Diagnosis not present

## 2022-08-16 DIAGNOSIS — I700 Atherosclerosis of aorta: Secondary | ICD-10-CM | POA: Diagnosis not present

## 2022-08-16 MED ORDER — HEPARIN SOD (PORK) LOCK FLUSH 100 UNIT/ML IV SOLN
INTRAVENOUS | Status: AC
  Filled 2022-08-16: qty 5

## 2022-08-16 MED ORDER — IOHEXOL 300 MG/ML  SOLN
100.0000 mL | Freq: Once | INTRAMUSCULAR | Status: AC | PRN
  Administered 2022-08-16: 100 mL via INTRAVENOUS

## 2022-08-16 MED ORDER — HEPARIN SOD (PORK) LOCK FLUSH 100 UNIT/ML IV SOLN
500.0000 [IU] | Freq: Once | INTRAVENOUS | Status: AC
  Administered 2022-08-16: 500 [IU] via INTRAVENOUS

## 2022-08-17 ENCOUNTER — Other Ambulatory Visit: Payer: Self-pay

## 2022-08-18 NOTE — Progress Notes (Signed)
[place]   [phone] [phone]   Clinic Follow up Note   Patient Care Team: Ronnell Freshwater, NP as PCP - General (Family Medicine) Buford Dresser, MD as PCP - Cardiology (Cardiology) Truitt Merle, MD as Consulting Physician (Oncology) Royston Bake, RN as Oncology Nurse Navigator (Oncology)  Date of Service:  08/19/2022  CHIEF COMPLAINT: f/u of  pancreatic cancer     CURRENT THERAPY:   Gemcitabine, days 1 and 8 q21d, starting 01/20/22      ASSESSMENT:  [name]. is a 82 y.o. male with   MDS (myelodysplastic syndrome) ([place]) -developed worsening thrombocytopenia in December 2023, required a platelet transfusion.  -Bone marrow seen on July 28, 2022 showed hypercellular marrow, megakaryocytes dyspoiesis, supports MDS.  Biopsy was negative for metastatic adenocarcinoma.  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 Abraxane was added with cycle 2 (09/17/21), but he tolerated poorly and discontinued after first dose.  --s/p whipple procedure on 11/15/21 with Dr. Zenia Resides. 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 Margins and lymph nodes negative. -he restarted adjuvant gemcitabine on 01/20/22, completed 06/18/2022 -recent CT 05/03/2022 showed NED -restaging CT from 08/17/22 showed small soft tissue nodule or lymph node interposed between the anterior aorta, portal vein, left renal vein, and superior mesenteric artery measuring 1.3 x 0.8 cm. This is new or at least significantly increased compared to prior CT examination dated 05/03/2022 and suspicious for local recurrence or nodal metastasis -unfortunately biopsy is not feasible due to location  -I recommend SBRT given the oligo mets   PLAN: -lab reviewed -discuss restaging CT  Scan -Switching Nplate to Promacta oral pill  -Referral Dr.Moody -lab in 2 wks and f/u in 4 weeks   SUMMARY OF ONCOLOGIC HISTORY: Oncology History Overview Note   Cancer Staging  Pancreatic cancer [facility]) Staging form: Exocrine Pancreas, AJCC 8th Edition - Clinical stage from 08/05/2021: Stage IB (cT2, cN0, cM0) - Signed by Truitt Merle, MD on 08/17/2021 Stage prefix: Initial diagnosis Total positive nodes: 0     Pancreatic mass  07/18/2021 Initial Diagnosis   Pancreatic mass   07/18/2021 Imaging   CT Abdomen Pelvis W Contrast  IMPRESSION: 1. Suspected solid mass within the proximal body of the pancreas measures 2.3 x 2.6 x 2.0 cm. There is downstream dilation of the main pancreatic duct and its branches. There is also intra and extrahepatic biliary ductal dilation. Further evaluation with MRI of the abdomen, pancreatic protocol, when clinically feasible, may be considered. 2. 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 3. Aortic atherosclerosis   07/20/2021 Procedure   DG ERCP  IMPRESSION: Nondiagnostic ERCP as above. Correlation with the operative report is advised.  - The examination was suspicious for a biliary stricture in the bile duct. - Examination was suspicious for carcinoma of the head of the pancreas. - Attempts at a cholangiogram failed.   07/22/2021 Pathology Results   CASE: [id number]   FINAL MICROSCOPIC DIAGNOSIS:  - Suspicious for malignancy  - See comment   DIAGNOSTIC COMMENTS:  There are rare cells with cytologic atypia suspicious for  adenocarcinoma.  Dr. Vic Ripper agrees.    07/22/2021 Procedure   IR INT EXT BILIARY DRAIN WITH CHOLANGIOGRAM ( IMPRESSION: 1. 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 2. Successful brush biopsy  of obstructing lesion x3. 3. Successful placement of a 10 French internal/external biliary drainage catheter.   PLAN: 1. Follow bilirubin.  When significant downward trend is clear, the bag should be capped. Recommend capping bag before discharge home if possible. 2. Follow-up in IR in 4-6 weeks for initial biliary tube check and exchange. If initial brush biopsies are negative, repeat biopsy could be considered at that time.   Pancreatic cancer ([place])  08/05/2021 Cancer Staging   Staging form: Exocrine Pancreas, AJCC 8th Edition - Clinical stage from 08/05/2021: Stage IB (cT2, cN0, cM0) - Signed by Truitt Merle, MD on 08/17/2021 Stage prefix: Initial diagnosis Total positive nodes: 0   08/14/2021 Initial Diagnosis   Pancreatic cancer ([place])   08/20/2021 - 03/23/2022 Chemotherapy   Patient is on Treatment Plan : PANCREATIC Abraxane / Gemcitabine D1,8 q21d     08/20/2021 -  Chemotherapy   Patient is on Treatment Plan : PANCREAS Gemcitabine D1,8 (1000) q21d x 8 Cycles     11/15/2021 Cancer Staging   Staging form: Exocrine Pancreas, AJCC 8th Edition - Pathologic stage from 11/15/2021: Stage IB (ypT2, pN0, cM0) - Signed by Truitt Merle, MD on 01/03/2022 Stage prefix: Post-therapy Total positive nodes: 0 Histologic grade (G): G3 Histologic grading system: 3 grade system Residual tumor (R): R0 - None      INTERVAL HISTORY:  Angel WEISGERBER Sr. is here for a follow up of pancreatic cancer    He was last seen by me on 08/02/22 He presents to the clinic accompanied by daughter. Pt denies having bleeding.Pt is excited about running errands today.Pt daughter stated he wants to go on the pill instead of the injection.     All other systems were reviewed with the patient and are negative.  MEDICAL HISTORY:  Past Medical History:  Diagnosis Date   Aneurysm ([place])    2017   Anxiety    Arthritis    Depression    Family history of breast cancer    Family history of lung cancer    Headache    HLD (hyperlipidemia)    Hx of inguinal hernia repair    Hypertension    Hypothyroidism    Overactive bladder    Pancreatic cancer ([facility])     Pneumonia    Shingles    Sleep apnea    Patient denies   Thoracic ascending aortic aneurysm ([place])    mildly dilated 4.0cm per 08/12/21 CT   Thyroid disease    Varicose veins of both lower extremities     SURGICAL HISTORY: Past Surgical History:  Procedure Laterality Date   BILATERAL CARPAL TUNNEL RELEASE     CATARACT EXTRACTION, BILATERAL Bilateral    ELBOW SURGERY Bilateral    ENDOSCOPIC RETROGRADE CHOLANGIOPANCREATOGRAPHY (ERCP) WITH PROPOFOL N/A 07/20/2021   Procedure: ENDOSCOPIC RETROGRADE CHOLANGIOPANCREATOGRAPHY (ERCP) WITH PROPOFOL;  Surgeon: Gatha Mayer, MD;  [facility] ENDOSCOPY;  Service: Endoscopy;  Laterality: N/A;   ESOPHAGOGASTRODUODENOSCOPY (EGD) WITH PROPOFOL N/A 08/05/2021   Procedure: ESOPHAGOGASTRODUODENOSCOPY (EGD) WITH PROPOFOL;  Surgeon: Rush Landmark Telford Nab., MD;  Location: [place];  Service: Gastroenterology;  Laterality: N/A;   EUS N/A 08/05/2021   Procedure: UPPER ENDOSCOPIC ULTRASOUND (EUS) RADIAL;  Surgeon: Irving Copas., MD;  Location: [place];  Service: Gastroenterology;  Laterality: N/A;   EYE SURGERY     bilateral cataracts   FINE NEEDLE ASPIRATION  08/05/2021   Procedure: FINE NEEDLE ASPIRATION (FNA) LINEAR;  Surgeon: Irving Copas., MD;  Location: [place];  Service: Gastroenterology;;  INGUINAL HERNIA REPAIR Right 1951   IR ANGIO INTRA EXTRACRAN SEL COM CAROTID INNOMINATE BILAT MOD SED  02/20/2019   IR BILIARY STENT(S) EXISTING ACCESS INC DILATION CATH EXCHANGE  09/20/2021   IR ENDOLUMINAL BX OF BILIARY TREE  07/22/2021   IR GENERIC HISTORICAL  10/28/2016   IR RADIOLOGIST EVAL & MGMT 10/28/2016 [facility]INTERV RAD   IR INT EXT BILIARY DRAIN WITH CHOLANGIOGRAM  07/22/2021   JOINT REPLACEMENT     LAPAROSCOPY N/A 11/15/2021   Procedure: LAPAROSCOPIC DIAGNOSTIC STAGING;  Surgeon: Dwan Bolt, MD;  Location: [place];  Service: General;  Laterality: N/A;   PORTACATH PLACEMENT N/A 08/19/2021   Procedure: INSERTION  PORT-A-CATH;  Surgeon: Dwan Bolt, MD;  Location: [facility];  Service: General;  Laterality: N/A;  LMA   RADIOLOGY WITH ANESTHESIA N/A 10/21/2015   Procedure: RADIOLOGY WITH ANESTHESIA;  Surgeon: Luanne Bras, MD;  Location: [place];  Service: Radiology;  Laterality: N/A;   RADIOLOGY WITH ANESTHESIA N/A 02/20/2019   Procedure: [employer];  Surgeon: Luanne Bras, MD;  Location: [place];  Service: Radiology;  Laterality: N/A;   ROTATOR CUFF REPAIR Bilateral    VARICOSE VEIN SURGERY     WHIPPLE PROCEDURE N/A 11/15/2021   Procedure: WHIPPLE PROCEDURE;  Surgeon: Dwan Bolt, MD;  Location: [place];  Service: General;  Laterality: N/A;    I have reviewed the social history and family history with the patient and they are unchanged from previous note.  ALLERGIES:  has No Known Allergies.  MEDICATIONS:  Current Outpatient Medications  Medication Sig Dispense Refill   eltrombopag (PROMACTA) 50 MG tablet Take 1 tablet (50 mg total) by mouth daily. Take on an empty stomach 1 hour before a meal or 2 hours after 30 tablet 2   acetaminophen (TYLENOL) 500 MG tablet Take 1 tablet (500 mg total) by mouth every 6 (six) hours as needed for mild pain. 30 tablet 0   amoxicillin-clavulanate (AUGMENTIN) 875-125 MG tablet Take 1 tablet by mouth 2 (two) times daily. 14 tablet 0   Cholecalciferol (VITAMIN D3) 50 MCG (2000 UT) TABS Take 2,000 Units by mouth daily.     dexamethasone (DECADRON) 4 MG tablet Take 8 mg by mouth daily.     docusate sodium (COLACE) 100 MG capsule Take 1 capsule (100 mg total) by mouth 2 (two) times daily. (Patient not taking: Reported on 06/29/2022) 28 capsule 0   escitalopram (LEXAPRO) 20 MG tablet Take 1 tablet (20 mg total) by mouth daily. 90 tablet 1   famotidine (PEPCID) 20 MG tablet Take 1 tablet (20 mg total) by mouth 2 (two) times daily. 60 tablet 1   ferrous sulfate 325 (65 FE) MG EC tablet Take 1 tablet (325 mg total) by mouth daily with breakfast. (Patient not taking:  Reported on 06/29/2022) 30 tablet 3   levothyroxine (SYNTHROID) 112 MCG tablet TAKE 1 TABLET (112 MCG TOTAL) BY MOUTH DAILY BEFORE BREAKFAST. 90 tablet 1   lipase/protease/amylase (CREON) 36000 UNITS CPEP capsule Take 1 capsule (36,000 Units total) by mouth 3 (three) times daily before meals. 180 capsule 3   lovastatin (MEVACOR) 20 MG tablet Take 1 tablet (20 mg total) by mouth at bedtime. 90 tablet 0   mirtazapine (REMERON) 7.5 MG tablet Take 1 tablet (7.5 mg total) by mouth at bedtime. 90 tablet 0   Multiple Vitamin (MULTIVITAMIN WITH MINERALS) TABS tablet Take 1 tablet by mouth daily. 30 tablet 0   nystatin-diphenhydrAMINE-alum & mag hydroxide-simeth Take 5 MLs by mouth 3 (three) times  daily as needed for mouth pain. 120 mL 1   pantoprazole (PROTONIX) 40 MG tablet Take 1 tablet by mouth daily. 90 tablet 0   polyethylene glycol (MIRALAX / GLYCOLAX) 17 g packet Take 17 g by mouth daily as needed for moderate constipation. (Patient not taking: Reported on 06/29/2022) 14 each 0   prochlorperazine (COMPAZINE) 10 MG tablet Take 1 tablet (10 mg total) by mouth every 6 (six) hours as needed (Nausea or vomiting). 30 tablet 1   vitamin B-12 (CYANOCOBALAMIN) 1000 MCG tablet Take 1,000 mcg by mouth 2 (two) times daily.     No current facility-administered medications for this visit.    PHYSICAL EXAMINATION: ECOG PERFORMANCE STATUS: 1 - Symptomatic but completely ambulatory  Vitals:   08/19/22 1344  Pulse: 62  Resp: 18  Temp: 98.6 F (37 C)  SpO2: 98%   Wt Readings from Last 3 Encounters:  08/19/22 123 lb 8 oz (56 kg)  07/28/22 132 lb 7.9 oz (60.1 kg)  07/22/22 132 lb 9.6 oz (60.1 kg)     GENERAL:alert, no distress and comfortable SKIN: skin color normal, no rashes or significant lesions EYES: normal, Conjunctiva are pink and non-injected, sclera clear  NEURO: alert & oriented x 3 with fluent speech  LABORATORY DATA:  I have reviewed the data as listed    Latest Ref Rng & Units 08/19/2022    12:30 PM 08/12/2022    1:01 PM 08/02/2022   10:56 AM  CBC  WBC 4.0 - 10.5 K/uL 3.8  5.5  5.6   Hemoglobin 13.0 - 17.0 g/dL 8.7  9.8  9.0   Hematocrit 39.0 - 52.0 % 26.3  29.6  27.1   Platelets 150 - 400 K/uL 89  145  23         Latest Ref Rng & Units 08/12/2022    1:01 PM 08/02/2022   10:56 AM 07/27/2022   10:29 AM  CMP  Glucose 70 - 99 mg/dL 98  160  89   BUN 8 - 23 mg/dL '17  23  15   '$ Creatinine 0.61 - 1.24 mg/dL 1.10  1.23  0.89   Sodium 135 - 145 mmol/L 139  135  142   Potassium 3.5 - 5.1 mmol/L 5.0  3.4  3.9   Chloride 98 - 111 mmol/L 105  100  107   CO2 22 - 32 mmol/L 31  27  32   Calcium 8.9 - 10.3 mg/dL 8.8  8.0  8.7   Total Protein 6.5 - 8.1 g/dL 6.1  5.7  5.2   Total Bilirubin 0.3 - 1.2 mg/dL 0.5  0.9  0.6   Alkaline Phos 38 - 126 U/L 70  72  82   AST 15 - 41 U/L 39  31  34   ALT 0 - 44 U/L '22  15  16       '$ RADIOGRAPHIC STUDIES: I have personally reviewed the radiological images as listed and agreed with the findings in the report. No results found.    Orders Placed This Encounter  Procedures   Ambulatory referral to Radiation Oncology    Referral Priority:   Routine    Referral Type:   Consultation    Referral Reason:   Specialty Services Required    Requested Specialty:   Radiation Oncology    Number of Visits Requested:   1   All questions were answered. The patient knows to call the clinic with any problems, questions or concerns. No barriers to learning  was detected. The total time spent in the appointment was 30 minutes.     Truitt Merle, MD 08/19/2022   Felicity Coyer, CMA, am acting as scribe for Truitt Merle, MD.   I have reviewed the above documentation for accuracy and completeness, and I agree with the above.

## 2022-08-19 ENCOUNTER — Encounter: Payer: Self-pay | Admitting: Hematology

## 2022-08-19 ENCOUNTER — Inpatient Hospital Stay: Payer: Medicare HMO

## 2022-08-19 ENCOUNTER — Telehealth: Payer: Self-pay | Admitting: Pharmacy Technician

## 2022-08-19 ENCOUNTER — Other Ambulatory Visit (HOSPITAL_COMMUNITY): Payer: Self-pay

## 2022-08-19 ENCOUNTER — Other Ambulatory Visit: Payer: Self-pay

## 2022-08-19 ENCOUNTER — Encounter: Payer: Self-pay | Admitting: *Deleted

## 2022-08-19 ENCOUNTER — Telehealth: Payer: Self-pay | Admitting: Pharmacist

## 2022-08-19 ENCOUNTER — Inpatient Hospital Stay: Payer: Medicare HMO | Attending: Physician Assistant | Admitting: Hematology

## 2022-08-19 VITALS — BP 156/80

## 2022-08-19 VITALS — HR 62 | Temp 98.6°F | Resp 18 | Ht 70.0 in | Wt 123.5 lb

## 2022-08-19 DIAGNOSIS — C251 Malignant neoplasm of body of pancreas: Secondary | ICD-10-CM

## 2022-08-19 DIAGNOSIS — D696 Thrombocytopenia, unspecified: Secondary | ICD-10-CM

## 2022-08-19 DIAGNOSIS — D469 Myelodysplastic syndrome, unspecified: Secondary | ICD-10-CM | POA: Diagnosis not present

## 2022-08-19 DIAGNOSIS — C250 Malignant neoplasm of head of pancreas: Secondary | ICD-10-CM

## 2022-08-19 DIAGNOSIS — Z95828 Presence of other vascular implants and grafts: Secondary | ICD-10-CM

## 2022-08-19 DIAGNOSIS — E860 Dehydration: Secondary | ICD-10-CM

## 2022-08-19 DIAGNOSIS — R112 Nausea with vomiting, unspecified: Secondary | ICD-10-CM

## 2022-08-19 LAB — CMP (CANCER CENTER ONLY)
ALT: 18 U/L (ref 0–44)
AST: 33 U/L (ref 15–41)
Albumin: 3.2 g/dL — ABNORMAL LOW (ref 3.5–5.0)
Alkaline Phosphatase: 65 U/L (ref 38–126)
Anion gap: 3 — ABNORMAL LOW (ref 5–15)
BUN: 19 mg/dL (ref 8–23)
CO2: 30 mmol/L (ref 22–32)
Calcium: 8.6 mg/dL — ABNORMAL LOW (ref 8.9–10.3)
Chloride: 107 mmol/L (ref 98–111)
Creatinine: 1.17 mg/dL (ref 0.61–1.24)
GFR, Estimated: >60 mL/min (ref >60)
Glucose, Bld: 72 mg/dL (ref 70–99)
Potassium: 3.9 mmol/L (ref 3.5–5.1)
Sodium: 140 mmol/L (ref 135–145)
Total Bilirubin: 0.4 mg/dL (ref 0.3–1.2)
Total Protein: 5.5 g/dL — ABNORMAL LOW (ref 6.5–8.1)

## 2022-08-19 LAB — CBC WITH DIFFERENTIAL (CANCER CENTER ONLY)
Abs Immature Granulocytes: 0 10*3/uL (ref 0.00–0.07)
Basophils Absolute: 0 10*3/uL (ref 0.0–0.1)
Basophils Relative: 1 %
Eosinophils Absolute: 0.1 10*3/uL (ref 0.0–0.5)
Eosinophils Relative: 2 %
HCT: 26.3 % — ABNORMAL LOW (ref 39.0–52.0)
Hemoglobin: 8.7 g/dL — ABNORMAL LOW (ref 13.0–17.0)
Immature Granulocytes: 0 %
Lymphocytes Relative: 31 %
Lymphs Abs: 1.2 10*3/uL (ref 0.7–4.0)
MCH: 30 pg (ref 26.0–34.0)
MCHC: 33.1 g/dL (ref 30.0–36.0)
MCV: 90.7 fL (ref 80.0–100.0)
Monocytes Absolute: 0.4 10*3/uL (ref 0.1–1.0)
Monocytes Relative: 10 %
Neutro Abs: 2.2 10*3/uL (ref 1.7–7.7)
Neutrophils Relative %: 56 %
Platelet Count: 89 10*3/uL — ABNORMAL LOW (ref 150–400)
RBC: 2.9 MIL/uL — ABNORMAL LOW (ref 4.22–5.81)
RDW: 15.2 % (ref 11.5–15.5)
WBC Count: 3.8 10*3/uL — ABNORMAL LOW (ref 4.0–10.5)
nRBC: 0 % (ref 0.0–0.2)

## 2022-08-19 LAB — VITAMIN B12: Vitamin B-12: 2286 pg/mL — ABNORMAL HIGH (ref 180–914)

## 2022-08-19 LAB — SAMPLE TO BLOOD BANK

## 2022-08-19 MED ORDER — ROMIPLOSTIM 125 MCG ~~LOC~~ SOLR
2.0000 ug/kg | SUBCUTANEOUS | Status: DC
  Administered 2022-08-19: 110 ug via SUBCUTANEOUS
  Filled 2022-08-19: qty 0.22

## 2022-08-19 MED ORDER — SODIUM CHLORIDE 0.9% FLUSH
10.0000 mL | INTRAVENOUS | Status: DC | PRN
  Administered 2022-08-19: 10 mL

## 2022-08-19 MED ORDER — PANCRELIPASE (LIP-PROT-AMYL) 36000-114000 UNITS PO CPEP: 36000.0000 [IU] | ORAL_CAPSULE | Freq: Three times a day (TID) | ORAL | 3 refills | Status: DC

## 2022-08-19 MED ORDER — ELTROMBOPAG OLAMINE 50 MG PO TABS
50.0000 mg | ORAL_TABLET | Freq: Every day | ORAL | 2 refills | Status: DC
  Filled 2022-08-19 – 2022-08-23 (×2): qty 30, 30d supply, fill #0
  Filled 2022-09-19: qty 30, 30d supply, fill #1
  Filled 2022-10-17: qty 30, 30d supply, fill #2

## 2022-08-19 MED ORDER — HEPARIN SOD (PORK) LOCK FLUSH 100 UNIT/ML IV SOLN
500.0000 [IU] | Freq: Once | INTRAVENOUS | Status: AC | PRN
  Administered 2022-08-19: 500 [IU]

## 2022-08-19 NOTE — Assessment & Plan Note (Addendum)
-  developed worsening thrombocytopenia in December 2023, required a platelet transfusion.  -Bone marrow seen on July 28, 2022 showed hypercellular marrow, megakaryocytes dyspoiesis, supports MDS.  Biopsy was negative for metastatic adenocarcinoma.  -cytogenetics and MDS FISH were normal  -he has responded well to Nplate  

## 2022-08-19 NOTE — Research (Signed)
MTG-015 - Tissue and Bodily Fluids: Translational Medicine: Discovery and Evaluation of Biomarkers/Pharmacogenomics for the Diagnosis and Personalized Management of Patients    This pt was into the [facility] for labs, see Dr. Burr Medico, and have his injection.  This research nurse met with pt and his daughter briefly and informed them that his 12 month follow up on the [employer] study is due soon.  The pt was explained that today's visit is not in the acceptable window to collect his follow up data, and the pt was told that Otilio Miu, research nurse lead, will call him to conduct the 12 month follow up over the phone in the near future.  The pt's daughter asked that the research nurse call him on Tuesday afternoon for the 12 month follow up phone call.  The pt was informed that he will receive a $50 gift card once this follow up call is completed.  The pt was thanked for his continued support of this study.  The pt and his daughter had no further questions for the research nurse.  His daughter said they would be expecting a phone call from [place], Nutritional therapist, next week.    Brion Aliment RN, BSN, CCRP Clinical Research Nurse Lead 08/19/2022 2:29 PM

## 2022-08-19 NOTE — Telephone Encounter (Signed)
Oral Oncology Patient Advocate Encounter   Received notification that prior authorization for Promacta is required.   PA submitted on 08/19/22 Key [id number] Status is pending     Lady Deutscher, CPhT-Adv Oncology Pharmacy Patient [place] Direct Number: [phone]  Fax: [phone]

## 2022-08-19 NOTE — Assessment & Plan Note (Signed)
cT2N0M0, stage IB, ypT2N0  -diagnosed in 07/2021 -received neoadjuvant chemo gemcitabine alone on 08/20/21. Abraxane was added with cycle 2 (09/17/21), but he tolerated poorly and discontinued after first dose.  --s/p whipple procedure on 11/15/21 with Dr. Zenia Resides. 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 Margins and lymph nodes negative. -he restarted adjuvant gemcitabine on 01/20/22, completed 06/18/2022 -recent CT 05/03/2022 showed NED -restaging CT from 08/17/22 showed small soft tissue nodule or lymph node interposed between the anterior aorta, portal vein, left renal vein, and superior mesenteric artery measuring 1.3 x 0.8 cm. This is new or at least significantly increased compared to prior CT examination dated 05/03/2022 and suspicious for local recurrence or nodal metastasis -unfortunately biopsy is not feasible due to location  -I recommend SBRT given the oligo mets

## 2022-08-19 NOTE — Telephone Encounter (Signed)
Oral Oncology Pharmacist Encounter  Received new prescription for Promacta (eltrombopag) for the treatment of thrombocytopenia secondary to MDS, planned duration until platelet count stabilization, disease progression or unacceptable drug toxicity.  CBC w/ Diff and CMP from 08/19/22 assessed, patient with pltc of 89 K/uL in the setting of currently receiving Nplate. Patient last received Nplate on 95/63/87, followed by today on 08/19/22.  Current medication list in Epic reviewed, DDIs with Promacta identified: Promacta administration will need to be separated from when the patient is taking multivitamins as well as ferrous sulfate due to risk of these vitamins and supplements decreasing Promacta efficacy. Promacta will need to be administered at least 2 hours before supplements or 4 hours after.  Category C drug-drug interaction between Promacta and Lovastatin - Lovastatin, a OATP1B1 substrate may have increased serum concentrations, thus risk of increased side effects when used in combination with Promacta, a possible OATP1B1 inhibitor. No changes in therapy warranted at this time. Dose adjustments of lovastatin may be required if patient has increased ADEs from medication.   Evaluated chart and no patient barriers to medication adherence noted.   Patient agreement for treatment documented in MD note on 08/19/22.  Prescription has been e-scribed to the [facility] for benefits analysis and approval.  Oral Oncology Clinic will continue to follow for insurance authorization, copayment issues, initial counseling and start date.  Leron Croak, PharmD, BCPS, BCOP Hematology/Oncology Clinical Pharmacist [facility] [place] [phone] 08/19/2022 3:15 PM

## 2022-08-19 NOTE — Telephone Encounter (Signed)
Oral Oncology Patient Advocate Encounter  Was successful in securing patient a $10,000 grant from [employer] to provide copayment coverage for Promacta.  This will keep the out of pocket expense at $0.     [employer] ID: [id number]  I have spoken with the patient.   The billing information is as follows   RxBin: [id number] PCN: [id number] Member ID: [id number] Group ID: [id number] Dates of Eligibility: 07/20/22 through 07/20/23  Fund:  [place] Patient [place] Direct Number: [phone]  Fax: [phone]

## 2022-08-22 ENCOUNTER — Encounter: Payer: Self-pay | Admitting: Hematology

## 2022-08-22 ENCOUNTER — Telehealth: Payer: Self-pay | Admitting: Radiation Oncology

## 2022-08-22 ENCOUNTER — Telehealth: Payer: Self-pay | Admitting: Hematology

## 2022-08-22 ENCOUNTER — Other Ambulatory Visit (HOSPITAL_COMMUNITY): Payer: Self-pay

## 2022-08-22 NOTE — Telephone Encounter (Signed)
Spoke with patient confirming upcoming appointments  

## 2022-08-22 NOTE — Telephone Encounter (Signed)
Called patient's daughter to schedule the patient w. Dr. Lisbeth Renshaw for a consultation. No answer, LVM for a return call.

## 2022-08-22 NOTE — Telephone Encounter (Signed)
Oral Oncology Patient Advocate Encounter  Prior Authorization for Antony [employer] has been approved.    PA# [id number] Effective dates: 08/21/22 through 08/15/23  Patients co-pay is $3,255.93.    Lady Deutscher, CPhT-Adv Oncology Pharmacy Patient [place] Direct Number: [phone]  Fax: [phone]

## 2022-08-23 ENCOUNTER — Telehealth: Payer: Self-pay | Admitting: Medical Oncology

## 2022-08-23 ENCOUNTER — Other Ambulatory Visit (HOSPITAL_COMMUNITY): Payer: Self-pay

## 2022-08-23 NOTE — Telephone Encounter (Signed)
MTG-015 - Tissue and Bodily Fluids: Translational Medicine: Discovery and Evaluation of Biomarkers/Pharmacogenomics for the Diagnosis and Personalized Management of Patients    Outgoing call: [phone]  Call to patient and spoke with patient's daughter, as was instructed. Daughter was informed of my follow-up, call last week, and was expecting my call. I introduced myself and informed patient's daughter, Lattie Haw, of what the follow-up questions consisted of. Lattie Haw and I completed and updated patient's medications and per Lattie Haw, patient stopped Augmentin, Remeron and decadron. Daugher also stated that patient never started colace, ferrous sulfate and the multivitamin. Daughter states that patient is out of his mouth wash and he is currently having mouth pain, also he ran out of his Creon yesterday. I informed daughter that I will send a message to Dr. Burr Medico of needing refills for these two. We reviewed patient's medical history and patient's blood pressure has improved and he is no longer taking bisoprolol. No new medical conditions were noted.  Daughter informed me that patient is scheduled to see radiation therapy tomorrow to consult for a new lymph node.  I thanked daughter for her time and patients contribution to the study.      Maxwell Marion, RN, BSN, [place] Clinical Research Nurse Lead 08/23/2022 2:46 PM

## 2022-08-23 NOTE — Progress Notes (Signed)
GI Location of Tumor / Histology: Pancreas with mets  Patient was diagnosed with pancreatic Cancer in December 2022.  He started chemotherapy in January 2023.  He recently had restaging scans that were suspicious for recurrence or nodal metastasis.  CT CAP 08/16/2022: small soft tissue nodule or lymph node interposed between the anterior aorta, portal vein, left renal vein, and superior mesenteric artery measuring 1.3 x 0.8 cm. This is new or at least significantly increased compared to prior CT examination dated 05/03/2022 and suspicious for local recurrence or nodal metastasis.   PET 07/27/2022: 0.8 cm focus of accentuated activity along the anterior margin of the abdominal aorta just below the level of the left renal vein, suspicious for a small hypermetabolic lymph node.  No other sites of hypermetabolic activity to suggest residual/recurrent malignancy identified.  Prior Whipple procedure with indistinct tissue planes in the vicinity of the resected portion of the pancreatic head.  Past/Anticipated interventions by surgeon, if any:  Dr. Zenia Resides -Whipple 11/15/2021    Past/Anticipated interventions by medical oncology, if any:  Dr. Burr Medico 08/19/2022 -Discussed restaging CT scan. --unfortunately biopsy is not feasible due to location  -I recommend SBRT given the oligo mets -Chemo  1/6-03/23/2022- Abraxane/Gemcitabine 6/8-11/11/2021- Adjuvant Gemcitabine.   Weight changes, if any: Reports weight loss of about 10-12 pounds in the last month.  Bowel/Bladder complaints, if any: He reports diarrhea yesterday.  Denies bladder changes.  Nausea / Vomiting, if any: He reports occasional nausea.  Denies vomiting.  Pain issues, if any:  Denies abdominal pains.  Appetite: He reports good appetite.  He reports he is not drinking fluids well.  He is getting in about 4 cups of liquids per day.     SAFETY ISSUES: Prior radiation? No Pacemaker/ICD? No Possible current pregnancy? N/a Is the patient on  methotrexate? No  Current Complaints/Details: Port Inserted

## 2022-08-23 NOTE — Telephone Encounter (Signed)
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   Counseled on administration, dosing, side effects, monitoring, drug-food interactions, safe handling, storage, and disposal.  Patient will take Promacta '50mg'$  tablets, 1 tablet by mouth once daily on an empty stomach, 1 hour before or 2 hours after a meal.  Counseled to administer Promacta at least 2 hours before, or 4 hours after antacids, foods high in calcium, or vitamin supplements (iron, calcium, aluminum, magnesium, selinium, zinc). Patient's daughter currently states that patient is not taking any multivitamins or supplements. Informed her that if he does start taking supplements, they will need to be separated from [facility] administration as previously mentioned. She voiced understanding.   Promacta start date: 08/25/22  Adverse effects include but are not limited to: fatigue, diarrhea, nausea, hepatotoxicity, myalgias.    Reviewed importance of keeping a medication schedule and plan for any missed doses. No barriers to medication adherence identified.  Medication reconciliation performed and medication/allergy list updated.  All questions answered.  Patient's daughter voiced understanding and appreciation.   Medication education handout placed in mail for patient. Patient and patient's family knows to call the office with questions or concerns. Oral Chemotherapy Clinic phone number provided.   Angel Keller, PharmD, BCPS, BCOP Hematology/Oncology Clinical Pharmacist [facility] [place] [phone] 08/23/2022 1:00 PM

## 2022-08-24 ENCOUNTER — Ambulatory Visit
Admission: RE | Admit: 2022-08-24 | Discharge: 2022-08-24 | Disposition: A | Discharge status: Home / Self Care | Payer: Medicare HMO | Source: Ambulatory Visit | Attending: Radiation Oncology | Admitting: Radiation Oncology

## 2022-08-24 ENCOUNTER — Other Ambulatory Visit: Payer: Self-pay

## 2022-08-24 ENCOUNTER — Other Ambulatory Visit (HOSPITAL_COMMUNITY): Payer: Self-pay

## 2022-08-24 VITALS — BP 171/76 | HR 58 | Resp 16 | Ht 70.0 in | Wt 123.6 lb

## 2022-08-24 DIAGNOSIS — M47816 Spondylosis without myelopathy or radiculopathy, lumbar region: Secondary | ICD-10-CM | POA: Diagnosis not present

## 2022-08-24 DIAGNOSIS — J90 Pleural effusion, not elsewhere classified: Secondary | ICD-10-CM | POA: Insufficient documentation

## 2022-08-24 DIAGNOSIS — Z803 Family history of malignant neoplasm of breast: Secondary | ICD-10-CM | POA: Insufficient documentation

## 2022-08-24 DIAGNOSIS — I714 Abdominal aortic aneurysm, without rupture, unspecified: Secondary | ICD-10-CM | POA: Diagnosis not present

## 2022-08-24 DIAGNOSIS — Z87891 Personal history of nicotine dependence: Secondary | ICD-10-CM | POA: Insufficient documentation

## 2022-08-24 DIAGNOSIS — C251 Malignant neoplasm of body of pancreas: Secondary | ICD-10-CM

## 2022-08-24 DIAGNOSIS — J320 Chronic maxillary sinusitis: Secondary | ICD-10-CM | POA: Diagnosis not present

## 2022-08-24 DIAGNOSIS — E039 Hypothyroidism, unspecified: Secondary | ICD-10-CM | POA: Diagnosis not present

## 2022-08-24 DIAGNOSIS — I2510 Atherosclerotic heart disease of native coronary artery without angina pectoris: Secondary | ICD-10-CM | POA: Insufficient documentation

## 2022-08-24 DIAGNOSIS — Z79899 Other long term (current) drug therapy: Secondary | ICD-10-CM | POA: Insufficient documentation

## 2022-08-24 DIAGNOSIS — E785 Hyperlipidemia, unspecified: Secondary | ICD-10-CM | POA: Insufficient documentation

## 2022-08-24 DIAGNOSIS — R609 Edema, unspecified: Secondary | ICD-10-CM | POA: Diagnosis not present

## 2022-08-24 DIAGNOSIS — I10 Essential (primary) hypertension: Secondary | ICD-10-CM | POA: Insufficient documentation

## 2022-08-24 DIAGNOSIS — Z801 Family history of malignant neoplasm of trachea, bronchus and lung: Secondary | ICD-10-CM | POA: Insufficient documentation

## 2022-08-24 DIAGNOSIS — E079 Disorder of thyroid, unspecified: Secondary | ICD-10-CM | POA: Insufficient documentation

## 2022-08-24 DIAGNOSIS — Z79890 Hormone replacement therapy (postmenopausal): Secondary | ICD-10-CM | POA: Insufficient documentation

## 2022-08-24 DIAGNOSIS — I700 Atherosclerosis of aorta: Secondary | ICD-10-CM | POA: Diagnosis not present

## 2022-08-24 DIAGNOSIS — N400 Enlarged prostate without lower urinary tract symptoms: Secondary | ICD-10-CM | POA: Diagnosis not present

## 2022-08-24 DIAGNOSIS — Z7952 Long term (current) use of systemic steroids: Secondary | ICD-10-CM | POA: Insufficient documentation

## 2022-08-24 DIAGNOSIS — G4730 Sleep apnea, unspecified: Secondary | ICD-10-CM | POA: Diagnosis not present

## 2022-08-24 MED ORDER — LIDOCAINE-PRILOCAINE 2.5-2.5 % EX CREA
1.0000 | TOPICAL_CREAM | CUTANEOUS | 0 refills | Status: AC | PRN
  Filled 2022-08-24: qty 30, 10d supply, fill #0

## 2022-08-24 MED ORDER — NYSTATIN 100000 UNIT/ML MT SUSP
Freq: Three times a day (TID) | OROMUCOSAL | 1 refills | Status: DC | PRN
  Filled 2022-08-24: qty 120, 8d supply, fill #0

## 2022-08-24 MED ORDER — PANCRELIPASE (LIP-PROT-AMYL) 36000-114000 UNITS PO CPEP: 36000.0000 [IU] | ORAL_CAPSULE | Freq: Three times a day (TID) | ORAL | 3 refills | Status: DC

## 2022-08-24 MED ORDER — PANTOPRAZOLE SODIUM 40 MG PO TBEC: 40.0000 mg | DELAYED_RELEASE_TABLET | Freq: Every day | ORAL | 0 refills | Status: DC

## 2022-08-24 NOTE — Progress Notes (Signed)
Radiation Oncology         [phone]) [phone] ________________________________  Name: Angel Celeste Sr.        MRN: [id number]  Date of Service: 08/24/2022 DOB: 05-29-1941  CC:Ronnell Freshwater, NP  Truitt Merle, MD     REFERRING PHYSICIAN: Truitt Merle, MD   DIAGNOSIS: The encounter diagnosis was Malignant neoplasm of body of pancreas ([place]).   HISTORY OF PRESENT ILLNESS: Angel Keller. is a 82 y.o. male seen at the request of Dr. 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 The patient was originally diagnosed in December 2022 with stage Ib adenocarcinoma of the pancreas.  His tumor was classified as T2 N0 at that time and he received neoadjuvant chemotherapy followed by Whipple procedure on 11/15/2021 with Dr. Zenia Resides.  A 2.6 cm residual invasive moderately to poorly differentiated ductal adenocarcinoma was noted in his margins and lymph nodes were all negative.  He completed adjuvant gemcitabine between June and November 2023 and was found to be without disease on interval scan in September 2023.  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  He is followed closely with [facility] for this.  A restaging CT scan from 08/17/2022 however showed a new nodule versus lymph node interposed between the anterior aorta, portal vein, left renal vein, and SMA measuring 1.3 cm suspicious for local recurrence.  His case was discussed and the location is not feasible for biopsy due to location and is seen to discuss radiotherapy to this lesion.    PREVIOUS RADIATION THERAPY: No   PAST MEDICAL HISTORY:  Past Medical History:  Diagnosis Date   Aneurysm ([place])    2017   Anxiety    Arthritis    Depression    Family history of breast cancer    Family history of lung cancer    Headache    HLD (hyperlipidemia)    Hx of inguinal hernia repair    Hypertension     Hypothyroidism    Overactive bladder    Pancreatic cancer (H[facility])    Pneumonia    Shingles    Sleep apnea    Patient denies   Thoracic ascending aortic aneurysm ([place])    mildly dilated 4.0cm per 08/12/21 CT   Thyroid disease    Varicose veins of both lower extremities        PAST SURGICAL HISTORY: Past Surgical History:  Procedure Laterality Date   BILATERAL CARPAL TUNNEL RELEASE     CATARACT EXTRACTION, BILATERAL Bilateral    ELBOW SURGERY Bilateral    ENDOSCOPIC RETROGRADE CHOLANGIOPANCREATOGRAPHY (ERCP) WITH PROPOFOL N/A 07/20/2021   Procedure: ENDOSCOPIC RETROGRADE CHOLANGIOPANCREATOGRAPHY (ERCP) WITH PROPOFOL;  Surgeon: Gatha Mayer, MD;  Location: [facility] ENDOSCOPY;  Service: Endoscopy;  Laterality: N/A;   ESOPHAGOGASTRODUODENOSCOPY (EGD) WITH PROPOFOL N/A 08/05/2021   Procedure: ESOPHAGOGASTRODUODENOSCOPY (EGD) WITH PROPOFOL;  Surgeon: Rush Landmark Telford Nab., MD;  Location: [place];  Service: Gastroenterology;  Laterality: N/A;   EUS N/A 08/05/2021   Procedure: UPPER ENDOSCOPIC ULTRASOUND (EUS) RADIAL;  Surgeon: Irving Copas., MD;  Location: [place];  Service: Gastroenterology;  Laterality: N/A;   EYE SURGERY     bilateral cataracts   FINE NEEDLE ASPIRATION  08/05/2021   Procedure: FINE NEEDLE ASPIRATION (FNA) LINEAR;  Surgeon: Irving Copas., MD;  [place];  Service: Gastroenterology;;   INGUINAL HERNIA REPAIR Right 1951   IR  ANGIO INTRA EXTRACRAN SEL COM CAROTID INNOMINATE BILAT MOD SED  02/20/2019   IR BILIARY STENT(S) EXISTING ACCESS INC DILATION CATH EXCHANGE  09/20/2021   IR ENDOLUMINAL BX OF BILIARY TREE  07/22/2021   IR GENERIC HISTORICAL  10/28/2016   IR RADIOLOGIST EVAL & MGMT 10/28/2016 [facility]INTERV RAD   IR INT EXT BILIARY DRAIN WITH CHOLANGIOGRAM  07/22/2021   JOINT REPLACEMENT     LAPAROSCOPY N/A 11/15/2021   Procedure: LAPAROSCOPIC DIAGNOSTIC STAGING;  Surgeon: Dwan Bolt, MD;  Location: [place];  Service: General;   Laterality: N/A;   PORTACATH PLACEMENT N/A 08/19/2021   Procedure: INSERTION PORT-A-CATH;  Surgeon: Dwan Bolt, MD;  Location: [facility];  Service: General;  Laterality: N/A;  LMA   RADIOLOGY WITH ANESTHESIA N/A 10/21/2015   Procedure: RADIOLOGY WITH ANESTHESIA;  Surgeon: Luanne Bras, MD;  Location: [place];  Service: Radiology;  Laterality: N/A;   RADIOLOGY WITH ANESTHESIA N/A 02/20/2019   Procedure: [employer];  Surgeon: Luanne Bras, MD;  Location: [place];  Service: Radiology;  Laterality: N/A;   ROTATOR CUFF REPAIR Bilateral    VARICOSE VEIN SURGERY     WHIPPLE PROCEDURE N/A 11/15/2021   Procedure: WHIPPLE PROCEDURE;  Surgeon: Dwan Bolt, MD;  Location: [place];  Service: General;  Laterality: N/A;     FAMILY HISTORY:  Family History  Problem Relation Age of Onset   Breast cancer Mother    Lung cancer Father    Alcoholism Sister    Cirrhosis Sister    Colon cancer Neg Hx    Esophageal cancer Neg Hx    Liver cancer Neg Hx    Pancreatic cancer Neg Hx    Prostate cancer Neg Hx      SOCIAL HISTORY:  reports that he quit smoking about 61 years ago. His smoking use included cigarettes. He has a 0.25 pack-year smoking history. He has never used smokeless tobacco. He reports that he does not drink alcohol and does not use drugs.  The patient is widowed and lives in [place].  His daughter Lattie Haw helps assist with his medical care as needed.   ALLERGIES: Patient has no known allergies.   MEDICATIONS:  Current Outpatient Medications  Medication Sig Dispense Refill   acetaminophen (TYLENOL) 500 MG tablet Take 1 tablet (500 mg total) by mouth every 6 (six) hours as needed for mild pain. 30 tablet 0   amoxicillin-clavulanate (AUGMENTIN) 875-125 MG tablet Take 1 tablet by mouth 2 (two) times daily. 14 tablet 0   Cholecalciferol (VITAMIN D3) 50 MCG (2000 UT) TABS Take 2,000 Units by mouth daily.     dexamethasone (DECADRON) 4 MG tablet Take 8 mg by mouth daily.      eltrombopag (PROMACTA) 50 MG tablet Take 1 tablet (50 mg total) by mouth daily. Take on an empty stomach 1 hour before a meal or 2 hours after 30 tablet 2   escitalopram (LEXAPRO) 20 MG tablet Take 1 tablet (20 mg total) by mouth daily. 90 tablet 1   famotidine (PEPCID) 20 MG tablet Take 1 tablet (20 mg total) by mouth 2 (two) times daily. 60 tablet 1   levothyroxine (SYNTHROID) 112 MCG tablet TAKE 1 TABLET (112 MCG TOTAL) BY MOUTH DAILY BEFORE BREAKFAST. 90 tablet 1   lidocaine-prilocaine (EMLA) cream Apply topically to affected area(s) as needed. 30 g 0   lovastatin (MEVACOR) 20 MG tablet Take 1 tablet (20 mg total) by mouth at bedtime. 90 tablet 0   mirtazapine (REMERON) 7.5 MG tablet Take 1  tablet (7.5 mg total) by mouth at bedtime. 90 tablet 0   Multiple Vitamin (MULTIVITAMIN WITH MINERALS) TABS tablet Take 1 tablet by mouth daily. 30 tablet 0   nystatin-diphenhydrAMINE-alum & mag hydroxide-simeth Take 5 MLs by mouth 3 (three) times daily as needed for mouth pain. 120 mL 1   prochlorperazine (COMPAZINE) 10 MG tablet Take 1 tablet (10 mg total) by mouth every 6 (six) hours as needed (Nausea or vomiting). 30 tablet 1   vitamin B-12 (CYANOCOBALAMIN) 1000 MCG tablet Take 1,000 mcg by mouth 2 (two) times daily.     docusate sodium (COLACE) 100 MG capsule Take 1 capsule (100 mg total) by mouth 2 (two) times daily. (Patient not taking: Reported on 06/29/2022) 28 capsule 0   ferrous sulfate 325 (65 FE) MG EC tablet Take 1 tablet (325 mg total) by mouth daily with breakfast. (Patient not taking: Reported on 06/29/2022) 30 tablet 3   lipase/protease/amylase (CREON) 36000 UNITS CPEP capsule Take 1 capsule (36,000 Units total) by mouth 3 (three) times daily before meals. 180 capsule 3   pantoprazole (PROTONIX) 40 MG tablet Take 1 tablet by mouth daily. 90 tablet 0   polyethylene glycol (MIRALAX / GLYCOLAX) 17 g packet Take 17 g by mouth daily as needed for moderate constipation. (Patient not taking: Reported  on 06/29/2022) 14 each 0   No current facility-administered medications for this encounter.     REVIEW OF SYSTEMS: On review of systems, the patient reports that he is doing okay. He's trying to gain weight back and is exercising 3 days a week at home. He reports he is eating pretty well without abdominal pain, nausea, or vomiting, and creon helps balance his bowel function. No other complaints are verbalized.      PHYSICAL EXAM:  Wt Readings from Last 3 Encounters:  08/24/22 123 lb 9.6 oz (56.1 kg)  08/19/22 123 lb 8 oz (56 kg)  07/28/22 132 lb 7.9 oz (60.1 kg)   Temp Readings from Last 3 Encounters:  08/19/22 98.6 F (37 C) (Oral)  08/12/22 97.6 F (36.4 C) (Oral)  08/02/22 99.3 F (37.4 C) (Oral)   BP Readings from Last 3 Encounters:  08/24/22 (!) 171/76  08/19/22 (!) 156/80  08/12/22 (!) 191/88   Pulse Readings from Last 3 Encounters:  08/24/22 (!) 58  08/19/22 62  08/12/22 (!) 58   Pain Assessment Pain Score: 0-No pain/10  In general this is a thin appearing Caucasian male in no acute distress. He's alert and oriented x4 and appropriate throughout the examination. Cardiopulmonary assessment is negative for acute distress and he exhibits normal effort.     ECOG = 0  0 - Asymptomatic (Fully active, able to carry on all predisease activities without restriction)  1 - Symptomatic but completely ambulatory (Restricted in physically strenuous activity but ambulatory and able to carry out work of a light or sedentary nature. For example, light housework, office work)  2 - Symptomatic, <50% in bed during the day (Ambulatory and capable of all self care but unable to carry out any work activities. Up and about more than 50% of waking hours)  3 - Symptomatic, >50% in bed, but not bedbound (Capable of only limited self-care, confined to bed or chair 50% or more of waking hours)  4 - Bedbound (Completely disabled. Cannot carry on any self-care. Totally confined to bed or  chair)  5 - Death   Eustace Pen MM, Creech RH, Tormey DC, et al. [phone]). "Toxicity and response criteria of the  [place] Cooperative Oncology Group". [place] Oncol. 5 (6): 649-55    LABORATORY DATA:  Lab Results  Component Value Date   WBC 3.8 (L) 08/19/2022   HGB 8.7 (L) 08/19/2022   HCT 26.3 (L) 08/19/2022   MCV 90.7 08/19/2022   PLT 89 (L) 08/19/2022   Lab Results  Component Value Date   NA 140 08/19/2022   K 3.9 08/19/2022   CL 107 08/19/2022   CO2 30 08/19/2022   Lab Results  Component Value Date   ALT 18 08/19/2022   AST 33 08/19/2022   ALKPHOS 65 08/19/2022   BILITOT 0.4 08/19/2022      RADIOGRAPHY: CT CHEST ABDOMEN PELVIS W CONTRAST  Result Date: 08/17/2022 CLINICAL DATA:  Pancreatic cancer restaging, status post Whipple procedure, abnormal FDG avidity on recent prior PET-CT * Tracking Code: BO * EXAM: CT CHEST, ABDOMEN, AND PELVIS WITH CONTRAST TECHNIQUE: Multidetector CT imaging of the chest, abdomen and pelvis was performed following the standard protocol during bolus administration of intravenous contrast. RADIATION DOSE REDUCTION: This exam was performed according to the departmental dose-optimization program which includes automated exposure control, adjustment of the mA and/or kV according to patient size and/or use of iterative reconstruction technique. CONTRAST:  141m OMNIPAQUE IOHEXOL 300 MG/ML  SOLN COMPARISON:  PET-CT, 07/27/2022, CT chest abdomen pelvis, 05/03/2022 FINDINGS: CT CHEST FINDINGS Cardiovascular: Right chest port catheter. Aortic atherosclerosis. Normal heart size. Three-vessel coronary artery calcifications. No pericardial effusion. Mediastinum/Nodes: No enlarged mediastinal, hilar, or axillary lymph nodes. Thyroid gland, trachea, and esophagus demonstrate no significant findings. Lungs/Pleura: Unchanged small bilateral pleural effusions and associated atelectasis or consolidation. Musculoskeletal: No chest wall abnormality. No acute osseous findings.  Disc degenerative disease and bridging osteophytosis throughout the thoracic spine. CT ABDOMEN PELVIS FINDINGS Hepatobiliary: No focal liver abnormality is seen. Status post cholecystectomy and hepatojejunostomy with postoperative pneumobilia. Pancreas: Status post Whipple pancreaticoduodenectomy. No pancreatic ductal dilatation or surrounding inflammatory changes. Spleen: Normal in size without significant abnormality. Adrenals/Urinary Tract: Adrenal glands are unremarkable. Kidneys are normal, without renal calculi, solid lesion, or hydronephrosis. Thickening of the decompressed urinary bladder wall. Stomach/Bowel: Status post distal gastrectomy and gastrojejunostomy. Appendix appears normal. No evidence of bowel wall thickening, distention, or inflammatory changes. Vascular/Lymphatic: Aortic atherosclerosis. No definite lymphadenopathy in the abdomen or pelvis. Corresponding to FDG avid focus overlying the aorta on prior PET-CT, there is an apparent small soft tissue nodule or lymph node interposed between the anterior aorta, portal vein, left renal vein, and superior mesenteric artery measuring 1.3 x 0.8 cm (series 5, image 104). This is new or at least significantly increased compared to prior CT examination dated 05/03/2022 (e.g. Prior examination series 2, image 61). Reproductive: Mild prostatomegaly with median lobe hypertrophy. Other: No abdominal wall hernia or abnormality. No ascites. Musculoskeletal: No acute osseous findings. IMPRESSION: 1. Corresponding to FDG avid focus overlying the aorta on prior PET-CT, there is an apparent small soft tissue nodule or lymph node interposed between the anterior aorta, portal vein, left renal vein, and superior mesenteric artery measuring 1.3 x 0.8 cm. This is new or at least significantly increased compared to prior CT examination dated 05/03/2022 and suspicious for local recurrence or nodal metastasis given abnormal FDG avidity. Close attention on follow-up. 2. No  other evidence of lymphadenopathy or metastatic disease in the chest, abdomen, or pelvis. 3. Status post Whipple pancreaticoduodenectomy. 4. Unchanged small bilateral pleural effusions and associated atelectasis or consolidation. 5. Mild prostatomegaly with median lobe hypertrophy. Thickening of the decompressed urinary bladder wall, likely  due to chronic outlet obstruction. 6. Coronary artery disease. Aortic Atherosclerosis (ICD10-I70.0). Electronically Signed   By: Delanna Ahmadi M.D.   On: 08/17/2022 20:12   DG Chest 2 View  Result Date: 08/02/2022 CLINICAL DATA:  Chest congestion and elevated temperature. History of pancreatic cancer. EXAM: CHEST - 2 VIEW COMPARISON:  AP chest 08/27/2021, 08/19/2021; chest two views 07/18/2021; CT chest 05/03/2022 FINDINGS: Right chest wall porta catheter is again seen with tip overlying the central superior vena cava. Cardiac silhouette is at the upper limits of normal size. Mildly tortuous thoracic aorta is again seen. Mild calcification within aortic arch. The lungs are clear.  No pleural effusion or pneumothorax. Moderate multilevel degenerative disc changes of the upper thoracic spine, similar to prior. IMPRESSION: No active cardiopulmonary disease. Electronically Signed   By: Yvonne Kendall M.D.   On: 08/02/2022 13:00   CT BONE MARROW BIOPSY & ASPIRATION  Result Date: 07/28/2022 INDICATION: THROMBOCYTOPENIA EXAM: CT GUIDED RIGHT ILIAC BONE MARROW ASPIRATION AND CORE BIOPSY Date:  07/28/2022 07/28/2022 11:32 am Radiologist:  M. Daryll Brod, MD Guidance:  CT FLUOROSCOPY: Fluoroscopy Time: None. MEDICATIONS: 1% lidocaine ANESTHESIA/SEDATION: 0 mg IV Versed; 25 mcg IV Fentanyl Moderate Sedation Time:  0 The patient was continuously monitored during the procedure by the interventional radiology nurse under my direct supervision. CONTRAST:  None. COMPLICATIONS: None PROCEDURE: Informed consent was obtained from the patient following explanation of the procedure, risks,  benefits and alternatives. The patient understands, agrees and consents for the procedure. All questions were addressed. A time out was performed. The patient was positioned prone and non-contrast localization CT was performed of the pelvis to demonstrate the iliac marrow spaces. Maximal barrier sterile technique utilized including caps, mask, sterile gowns, sterile gloves, large sterile drape, hand hygiene, and Betadine prep. Under sterile conditions and local anesthesia, an 11 gauge coaxial bone biopsy needle was advanced into the right iliac marrow space. Needle position was confirmed with CT imaging. Initially, bone marrow aspiration was performed. Next, the 11 gauge outer cannula was utilized to obtain a right iliac bone marrow core biopsy. Needle was removed. Hemostasis was obtained with compression. The patient tolerated the procedure well. Samples were prepared with the cytotechnologist. No immediate complications. IMPRESSION: CT guided right iliac bone marrow aspiration and core biopsy. Electronically Signed   By: Jerilynn Mages.  Shick M.D.   On: 07/28/2022 12:35   NM PET Image Initial (PI) Skull Base To Thigh  Result Date: 07/27/2022 CLINICAL DATA:  Subsequent treatment strategy for pancreatic cancer. EXAM: NUCLEAR MEDICINE PET SKULL BASE TO THIGH TECHNIQUE: 6.6 mCi F-18 FDG was injected intravenously. Full-ring PET imaging was performed from the skull base to thigh after the radiotracer. CT data was obtained and used for attenuation correction and anatomic localization. Fasting blood glucose: 90 mg/dl COMPARISON:  05/03/2022 CT scan FINDINGS: Mediastinal blood pool activity: SUV max 1.5 Liver activity: SUV max NA NECK: No significant abnormal hypermetabolic activity in this region. Incidental CT findings: Right MCA stent observed intracranially. Chronic left maxillary sinusitis. Bilateral common carotid atherosclerotic calcification. CHEST: No significant abnormal hypermetabolic activity in this region.  Incidental CT findings: Right Port-A-Cath tip: SVC. Coronary, aortic arch, and branch vessel atherosclerotic vascular disease. Moderate bilateral pleural effusions. ABDOMEN/PELVIS: Anterior to the abdominal aorta just below the level of the left renal vein, a 0.8 cm focus of activity suspicious for a hypermetabolic lymph node has an SUV of 7.3. Incidental CT findings: Prior Whipple procedure with indistinct tissue planes is in the vicinity of the resected portion  of the pancreatic head. No hypermetabolic liver lesion is currently identified. Atherosclerosis is present, including aortoiliac atherosclerotic disease. There is atheromatous plaque proximally in the SMA and along the origin of the celiac trunk. Borderline wall thickening in the urinary bladder, cannot exclude cystitis. Mild diffuse subcutaneous edema. SKELETON: No significant abnormal hypermetabolic activity in this region. Incidental CT findings: Degenerative glenohumeral arthropathy bilaterally. Degenerative hip arthropathy bilaterally. Cervical, thoracic, lumbar spondylosis. IMPRESSION: 1. 0.8 cm focus of accentuated activity (maximum SUV 7.3) along the anterior margin of the abdominal aorta just below the level of the left renal vein, suspicious for a small hypermetabolic lymph node. No other sites of hypermetabolic activity to suggest residual/recurrent malignancy identified. 2. Prior Whipple procedure with indistinct tissue planes in the vicinity of the resected portion of the pancreatic head. 3. Moderate bilateral pleural effusions. Mild diffuse subcutaneous edema. 4. Chronic left maxillary sinusitis. 5. Chronic right MCA stent. 6. Borderline wall thickening in the urinary bladder, cannot exclude cystitis. 7. Multilevel degenerative findings in the spine, shoulders, and hips. 8. Aortic atherosclerosis. Aortic Atherosclerosis (ICD10-I70.0). Electronically Signed   By: Van Clines M.D.   On: 07/27/2022 15:53        IMPRESSION/PLAN: 1. Recurrent Stage IB, cT2N0M0, adenocarcinoma of the pancreatic body with peripancreatic nodal recurrence following neoadjuvant chemo and whipple procedure. Dr. Lisbeth Renshaw discusses the patient's surgical pathology and course to date.  We discussed the risks, benefits, short, and long term effects of radiotherapy, as well as the curative intent, and the patient is interested in proceeding. Dr. Lisbeth Renshaw discusses the delivery and logistics of radiotherapy and anticipates a course of 5-10 fractions of either SBRT or Ultrahypofractionated radiotherapy depending on dosimetry. Written consent is obtained and placed in the chart, a copy was provided to the patient. The patient will be contacted to coordinate treatment planning by our simulation department with IV and oral contrast prior.    In a visit lasting 60 minutes, greater than 50% of the time was spent face to face discussing the patient's condition, in preparation for the discussion, and coordinating the patient's care.   The above documentation reflects my direct findings during this shared patient visit. Please see the separate note by Dr. Lisbeth Renshaw on this date for the remainder of the patient's plan of care.    Carola Rhine, [facility]   **Disclaimer: This note was dictated with voice recognition software. Similar sounding words can inadvertently be transcribed and this note may contain transcription errors which may not have been corrected upon publication of note.**

## 2022-08-25 ENCOUNTER — Other Ambulatory Visit (HOSPITAL_COMMUNITY): Payer: Self-pay

## 2022-08-25 DIAGNOSIS — C251 Malignant neoplasm of body of pancreas: Secondary | ICD-10-CM | POA: Diagnosis not present

## 2022-08-26 ENCOUNTER — Encounter: Payer: Self-pay | Admitting: Hematology

## 2022-08-29 ENCOUNTER — Other Ambulatory Visit (HOSPITAL_COMMUNITY): Payer: Self-pay

## 2022-09-05 ENCOUNTER — Ambulatory Visit: Admission: RE | Admit: 2022-09-05 | Payer: Medicare HMO | Source: Ambulatory Visit | Admitting: Radiation Oncology

## 2022-09-05 ENCOUNTER — Other Ambulatory Visit (HOSPITAL_COMMUNITY): Payer: Self-pay

## 2022-09-05 ENCOUNTER — Inpatient Hospital Stay: Payer: Medicare HMO

## 2022-09-05 ENCOUNTER — Ambulatory Visit
Admission: RE | Admit: 2022-09-05 | Discharge: 2022-09-05 | Disposition: A | Discharge status: Home / Self Care | Payer: Medicare HMO | Source: Ambulatory Visit | Attending: Radiation Oncology | Admitting: Radiation Oncology

## 2022-09-05 DIAGNOSIS — C251 Malignant neoplasm of body of pancreas: Secondary | ICD-10-CM

## 2022-09-05 DIAGNOSIS — C250 Malignant neoplasm of head of pancreas: Secondary | ICD-10-CM

## 2022-09-05 DIAGNOSIS — Z95828 Presence of other vascular implants and grafts: Secondary | ICD-10-CM

## 2022-09-05 DIAGNOSIS — D696 Thrombocytopenia, unspecified: Secondary | ICD-10-CM

## 2022-09-05 LAB — CMP (CANCER CENTER ONLY)
ALT: 13 U/L (ref 0–44)
AST: 21 U/L (ref 15–41)
Albumin: 3.2 g/dL — ABNORMAL LOW (ref 3.5–5.0)
Alkaline Phosphatase: 66 U/L (ref 38–126)
Anion gap: 3 — ABNORMAL LOW (ref 5–15)
BUN: 16 mg/dL (ref 8–23)
CO2: 30 mmol/L (ref 22–32)
Calcium: 8.6 mg/dL — ABNORMAL LOW (ref 8.9–10.3)
Chloride: 105 mmol/L (ref 98–111)
Creatinine: 1.18 mg/dL (ref 0.61–1.24)
GFR, Estimated: >60 mL/min (ref >60)
Glucose, Bld: 106 mg/dL — ABNORMAL HIGH (ref 70–99)
Potassium: 3.9 mmol/L (ref 3.5–5.1)
Sodium: 138 mmol/L (ref 135–145)
Total Bilirubin: 0.4 mg/dL (ref 0.3–1.2)
Total Protein: 5.9 g/dL — ABNORMAL LOW (ref 6.5–8.1)

## 2022-09-05 LAB — CBC WITH DIFFERENTIAL (CANCER CENTER ONLY)
Abs Immature Granulocytes: 0.01 10*3/uL (ref 0.00–0.07)
Basophils Absolute: 0 10*3/uL (ref 0.0–0.1)
Basophils Relative: 0 %
Eosinophils Absolute: 0.1 10*3/uL (ref 0.0–0.5)
Eosinophils Relative: 2 %
HCT: 28.2 % — ABNORMAL LOW (ref 39.0–52.0)
Hemoglobin: 9.2 g/dL — ABNORMAL LOW (ref 13.0–17.0)
Immature Granulocytes: 0 %
Lymphocytes Relative: 31 %
Lymphs Abs: 1 10*3/uL (ref 0.7–4.0)
MCH: 28.9 pg (ref 26.0–34.0)
MCHC: 32.6 g/dL (ref 30.0–36.0)
MCV: 88.7 fL (ref 80.0–100.0)
Monocytes Absolute: 0.3 10*3/uL (ref 0.1–1.0)
Monocytes Relative: 10 %
Neutro Abs: 1.9 10*3/uL (ref 1.7–7.7)
Neutrophils Relative %: 57 %
Platelet Count: 150 10*3/uL (ref 150–400)
RBC: 3.18 MIL/uL — ABNORMAL LOW (ref 4.22–5.81)
RDW: 14.7 % (ref 11.5–15.5)
WBC Count: 3.3 10*3/uL — ABNORMAL LOW (ref 4.0–10.5)
nRBC: 0 % (ref 0.0–0.2)

## 2022-09-05 LAB — SAMPLE TO BLOOD BANK

## 2022-09-05 LAB — VITAMIN B12: Vitamin B-12: 1288 pg/mL — ABNORMAL HIGH (ref 180–914)

## 2022-09-05 MED ORDER — SODIUM CHLORIDE 0.9% FLUSH
10.0000 mL | Freq: Once | INTRAVENOUS | Status: AC
  Administered 2022-09-05: 10 mL via INTRAVENOUS

## 2022-09-05 MED ORDER — HEPARIN SOD (PORK) LOCK FLUSH 100 UNIT/ML IV SOLN
500.0000 [IU] | Freq: Once | INTRAVENOUS | Status: AC
  Administered 2022-09-05: 500 [IU] via INTRAVENOUS

## 2022-09-05 MED ORDER — HEPARIN SOD (PORK) LOCK FLUSH 100 UNIT/ML IV SOLN: 500.0000 [IU] | Freq: Once | INTRAVENOUS | Status: DC | PRN

## 2022-09-05 MED ORDER — SODIUM CHLORIDE 0.9% FLUSH
10.0000 mL | INTRAVENOUS | Status: DC | PRN
  Administered 2022-09-05: 10 mL

## 2022-09-09 ENCOUNTER — Other Ambulatory Visit (HOSPITAL_COMMUNITY): Payer: Self-pay

## 2022-09-09 DIAGNOSIS — C251 Malignant neoplasm of body of pancreas: Secondary | ICD-10-CM | POA: Diagnosis not present

## 2022-09-12 ENCOUNTER — Other Ambulatory Visit (HOSPITAL_COMMUNITY): Payer: Self-pay

## 2022-09-13 DIAGNOSIS — C251 Malignant neoplasm of body of pancreas: Secondary | ICD-10-CM | POA: Diagnosis not present

## 2022-09-15 ENCOUNTER — Ambulatory Visit
Admission: RE | Admit: 2022-09-15 | Discharge: 2022-09-15 | Disposition: A | Discharge status: Home / Self Care | Payer: Medicare HMO | Source: Ambulatory Visit | Attending: Radiation Oncology | Admitting: Radiation Oncology

## 2022-09-15 ENCOUNTER — Other Ambulatory Visit: Payer: Self-pay

## 2022-09-15 DIAGNOSIS — C251 Malignant neoplasm of body of pancreas: Secondary | ICD-10-CM | POA: Diagnosis not present

## 2022-09-15 DIAGNOSIS — Z510 Encounter for antineoplastic radiation therapy: Secondary | ICD-10-CM | POA: Diagnosis not present

## 2022-09-15 LAB — RAD ONC ARIA SESSION SUMMARY
Course Elapsed Days: 0
Plan Fractions Treated to Date: 1
Plan Prescribed Dose Per Fraction: 5 Gy
Plan Total Fractions Prescribed: 10
Plan Total Prescribed Dose: 50 Gy
Reference Point Dosage Given to Date: 5 Gy
Reference Point Session Dosage Given: 5 Gy
Session Number: 1

## 2022-09-16 ENCOUNTER — Emergency Department (HOSPITAL_COMMUNITY): Payer: Medicare HMO

## 2022-09-16 ENCOUNTER — Ambulatory Visit: Payer: Medicare HMO

## 2022-09-16 ENCOUNTER — Observation Stay (HOSPITAL_COMMUNITY)
Admission: EM | Admit: 2022-09-16 | Discharge: 2022-09-17 | Disposition: A | Discharge status: Home Health | Payer: Medicare HMO | Attending: Internal Medicine | Admitting: Internal Medicine

## 2022-09-16 ENCOUNTER — Observation Stay (HOSPITAL_COMMUNITY): Payer: Medicare HMO

## 2022-09-16 ENCOUNTER — Telehealth: Payer: Self-pay | Admitting: Hematology

## 2022-09-16 ENCOUNTER — Encounter (HOSPITAL_COMMUNITY): Payer: Self-pay

## 2022-09-16 ENCOUNTER — Other Ambulatory Visit (HOSPITAL_COMMUNITY): Payer: Self-pay

## 2022-09-16 ENCOUNTER — Other Ambulatory Visit: Payer: Self-pay

## 2022-09-16 DIAGNOSIS — M6281 Muscle weakness (generalized): Secondary | ICD-10-CM | POA: Diagnosis not present

## 2022-09-16 DIAGNOSIS — Z96659 Presence of unspecified artificial knee joint: Secondary | ICD-10-CM | POA: Insufficient documentation

## 2022-09-16 DIAGNOSIS — D72829 Elevated white blood cell count, unspecified: Secondary | ICD-10-CM | POA: Insufficient documentation

## 2022-09-16 DIAGNOSIS — R2681 Unsteadiness on feet: Secondary | ICD-10-CM | POA: Diagnosis not present

## 2022-09-16 DIAGNOSIS — I6782 Cerebral ischemia: Secondary | ICD-10-CM | POA: Diagnosis not present

## 2022-09-16 DIAGNOSIS — Z8507 Personal history of malignant neoplasm of pancreas: Secondary | ICD-10-CM | POA: Diagnosis not present

## 2022-09-16 DIAGNOSIS — Z23 Encounter for immunization: Secondary | ICD-10-CM | POA: Insufficient documentation

## 2022-09-16 DIAGNOSIS — W19XXXA Unspecified fall, initial encounter: Secondary | ICD-10-CM | POA: Diagnosis not present

## 2022-09-16 DIAGNOSIS — D696 Thrombocytopenia, unspecified: Secondary | ICD-10-CM | POA: Diagnosis present

## 2022-09-16 DIAGNOSIS — R531 Weakness: Secondary | ICD-10-CM | POA: Diagnosis not present

## 2022-09-16 DIAGNOSIS — Z87891 Personal history of nicotine dependence: Secondary | ICD-10-CM | POA: Insufficient documentation

## 2022-09-16 DIAGNOSIS — C259 Malignant neoplasm of pancreas, unspecified: Secondary | ICD-10-CM | POA: Diagnosis present

## 2022-09-16 DIAGNOSIS — R2689 Other abnormalities of gait and mobility: Secondary | ICD-10-CM | POA: Diagnosis not present

## 2022-09-16 DIAGNOSIS — E039 Hypothyroidism, unspecified: Secondary | ICD-10-CM | POA: Diagnosis not present

## 2022-09-16 DIAGNOSIS — I10 Essential (primary) hypertension: Secondary | ICD-10-CM | POA: Diagnosis not present

## 2022-09-16 DIAGNOSIS — Z79899 Other long term (current) drug therapy: Secondary | ICD-10-CM | POA: Diagnosis not present

## 2022-09-16 DIAGNOSIS — I711 Thoracic aortic aneurysm, ruptured, unspecified: Secondary | ICD-10-CM | POA: Diagnosis not present

## 2022-09-16 DIAGNOSIS — I639 Cerebral infarction, unspecified: Secondary | ICD-10-CM | POA: Diagnosis not present

## 2022-09-16 DIAGNOSIS — F419 Anxiety disorder, unspecified: Secondary | ICD-10-CM | POA: Diagnosis present

## 2022-09-16 DIAGNOSIS — R29818 Other symptoms and signs involving the nervous system: Secondary | ICD-10-CM | POA: Diagnosis not present

## 2022-09-16 DIAGNOSIS — E785 Hyperlipidemia, unspecified: Secondary | ICD-10-CM | POA: Diagnosis present

## 2022-09-16 DIAGNOSIS — D469 Myelodysplastic syndrome, unspecified: Secondary | ICD-10-CM | POA: Diagnosis present

## 2022-09-16 LAB — DIFFERENTIAL
Abs Immature Granulocytes: 0.01 10*3/uL (ref 0.00–0.07)
Basophils Absolute: 0 10*3/uL (ref 0.0–0.1)
Basophils Relative: 0 %
Eosinophils Absolute: 0.1 10*3/uL (ref 0.0–0.5)
Eosinophils Relative: 2 %
Immature Granulocytes: 0 %
Lymphocytes Relative: 21 %
Lymphs Abs: 0.7 10*3/uL (ref 0.7–4.0)
Monocytes Absolute: 0.3 10*3/uL (ref 0.1–1.0)
Monocytes Relative: 9 %
Neutro Abs: 2.3 10*3/uL (ref 1.7–7.7)
Neutrophils Relative %: 68 %

## 2022-09-16 LAB — I-STAT CHEM 8, ED
BUN: 17 mg/dL (ref 8–23)
Calcium, Ion: 1.17 mmol/L (ref 1.15–1.40)
Chloride: 103 mmol/L (ref 98–111)
Creatinine, Ser: 0.9 mg/dL (ref 0.61–1.24)
Glucose, Bld: 79 mg/dL (ref 70–99)
HCT: 31 % — ABNORMAL LOW (ref 39.0–52.0)
Hemoglobin: 10.5 g/dL — ABNORMAL LOW (ref 13.0–17.0)
Potassium: 3.9 mmol/L (ref 3.5–5.1)
Sodium: 140 mmol/L (ref 135–145)
TCO2: 26 mmol/L (ref 22–32)

## 2022-09-16 LAB — URINALYSIS, ROUTINE W REFLEX MICROSCOPIC
Bacteria, UA: NONE SEEN
Bilirubin Urine: NEGATIVE
Glucose, UA: NEGATIVE mg/dL
Ketones, ur: NEGATIVE mg/dL
Leukocytes,Ua: NEGATIVE
Nitrite: NEGATIVE
Protein, ur: NEGATIVE mg/dL
Specific Gravity, Urine: 1.009 (ref 1.005–1.030)
pH: 6 (ref 5.0–8.0)

## 2022-09-16 LAB — CBC
HCT: 30.8 % — ABNORMAL LOW (ref 39.0–52.0)
Hemoglobin: 9.8 g/dL — ABNORMAL LOW (ref 13.0–17.0)
MCH: 28.1 pg (ref 26.0–34.0)
MCHC: 31.8 g/dL (ref 30.0–36.0)
MCV: 88.3 fL (ref 80.0–100.0)
Platelets: 127 10*3/uL — ABNORMAL LOW (ref 150–400)
RBC: 3.49 MIL/uL — ABNORMAL LOW (ref 4.22–5.81)
RDW: 14.8 % (ref 11.5–15.5)
WBC: 3.4 10*3/uL — ABNORMAL LOW (ref 4.0–10.5)
nRBC: 0 % (ref 0.0–0.2)

## 2022-09-16 LAB — COMPREHENSIVE METABOLIC PANEL
ALT: 16 U/L (ref 0–44)
AST: 28 U/L (ref 15–41)
Albumin: 3.1 g/dL — ABNORMAL LOW (ref 3.5–5.0)
Alkaline Phosphatase: 65 U/L (ref 38–126)
Anion gap: 9 (ref 5–15)
BUN: 20 mg/dL (ref 8–23)
CO2: 26 mmol/L (ref 22–32)
Calcium: 8.4 mg/dL — ABNORMAL LOW (ref 8.9–10.3)
Chloride: 103 mmol/L (ref 98–111)
Creatinine, Ser: 1.15 mg/dL (ref 0.61–1.24)
GFR, Estimated: >60 mL/min (ref >60)
Glucose, Bld: 84 mg/dL (ref 70–99)
Potassium: 3.8 mmol/L (ref 3.5–5.1)
Sodium: 138 mmol/L (ref 135–145)
Total Bilirubin: 0.4 mg/dL (ref 0.3–1.2)
Total Protein: 6.3 g/dL — ABNORMAL LOW (ref 6.5–8.1)

## 2022-09-16 LAB — RAPID URINE DRUG SCREEN, HOSP PERFORMED
Amphetamines: NOT DETECTED
Barbiturates: NOT DETECTED
Benzodiazepines: NOT DETECTED
Cocaine: NOT DETECTED
Opiates: NOT DETECTED
Tetrahydrocannabinol: NOT DETECTED

## 2022-09-16 LAB — HEMOGLOBIN A1C
Hgb A1c MFr Bld: 5.2 % (ref 4.8–5.6)
Mean Plasma Glucose: 102.54 mg/dL

## 2022-09-16 LAB — PROTIME-INR
INR: 1.3 — ABNORMAL HIGH (ref 0.8–1.2)
Prothrombin Time: 16.4 seconds — ABNORMAL HIGH (ref 11.4–15.2)

## 2022-09-16 LAB — APTT: aPTT: >200 seconds (ref 24–36)

## 2022-09-16 LAB — ETHANOL: Alcohol, Ethyl (B): <10 mg/dL (ref <10)

## 2022-09-16 MED ORDER — ACETAMINOPHEN 325 MG PO TABS: 650.0000 mg | ORAL_TABLET | ORAL | Status: DC | PRN

## 2022-09-16 MED ORDER — PNEUMOCOCCAL VAC POLYVALENT 25 MCG/0.5ML IJ INJ
0.5000 mL | INJECTION | INTRAMUSCULAR | Status: AC
  Administered 2022-09-17: 0.5 mL via INTRAMUSCULAR
  Filled 2022-09-16: qty 0.5

## 2022-09-16 MED ORDER — PANCRELIPASE (LIP-PROT-AMYL) 36000-114000 UNITS PO CPEP
72000.0000 [IU] | ORAL_CAPSULE | Freq: Three times a day (TID) | ORAL | Status: DC
  Administered 2022-09-16 – 2022-09-17 (×4): 72000 [IU] via ORAL
  Filled 2022-09-16 (×5): qty 2

## 2022-09-16 MED ORDER — MIRTAZAPINE 15 MG PO TABS
7.5000 mg | ORAL_TABLET | Freq: Every day | ORAL | Status: DC
  Administered 2022-09-16: 7.5 mg via ORAL
  Filled 2022-09-16: qty 1

## 2022-09-16 MED ORDER — ESCITALOPRAM OXALATE 10 MG PO TABS
20.0000 mg | ORAL_TABLET | Freq: Every day | ORAL | Status: DC
  Administered 2022-09-16 – 2022-09-17 (×2): 20 mg via ORAL
  Filled 2022-09-16 (×2): qty 2

## 2022-09-16 MED ORDER — STROKE: EARLY STAGES OF RECOVERY BOOK
Freq: Once | Status: AC
  Filled 2022-09-16: qty 1

## 2022-09-16 MED ORDER — ATORVASTATIN CALCIUM 40 MG PO TABS
40.0000 mg | ORAL_TABLET | Freq: Every day | ORAL | Status: DC
  Administered 2022-09-16: 40 mg via ORAL
  Filled 2022-09-16: qty 1

## 2022-09-16 MED ORDER — HYDRALAZINE HCL 20 MG/ML IJ SOLN
5.0000 mg | INTRAMUSCULAR | Status: DC | PRN
  Administered 2022-09-17: 5 mg via INTRAVENOUS
  Filled 2022-09-16: qty 1

## 2022-09-16 MED ORDER — SENNOSIDES-DOCUSATE SODIUM 8.6-50 MG PO TABS: 1.0000 | ORAL_TABLET | Freq: Every evening | ORAL | Status: DC | PRN

## 2022-09-16 MED ORDER — ASPIRIN 300 MG RE SUPP: 300.0000 mg | Freq: Every day | RECTAL | Status: DC

## 2022-09-16 MED ORDER — ACETAMINOPHEN 650 MG RE SUPP: 650.0000 mg | RECTAL | Status: DC | PRN

## 2022-09-16 MED ORDER — ACETAMINOPHEN 160 MG/5ML PO SOLN: 650.0000 mg | ORAL | Status: DC | PRN

## 2022-09-16 MED ORDER — ELTROMBOPAG OLAMINE 50 MG PO TABS: 50.0000 mg | ORAL_TABLET | Freq: Every day | ORAL | Status: DC

## 2022-09-16 MED ORDER — MIDAZOLAM HCL 2 MG/2ML IJ SOLN: 1.0000 mg | Freq: Once | INTRAMUSCULAR | Status: DC | PRN

## 2022-09-16 MED ORDER — INFLUENZA VAC A&B SA ADJ QUAD 0.5 ML IM PRSY
0.5000 mL | PREFILLED_SYRINGE | INTRAMUSCULAR | Status: AC
  Administered 2022-09-17: 0.5 mL via INTRAMUSCULAR
  Filled 2022-09-16: qty 0.5

## 2022-09-16 MED ORDER — LEVOTHYROXINE SODIUM 112 MCG PO TABS
112.0000 ug | ORAL_TABLET | Freq: Every day | ORAL | Status: DC
  Administered 2022-09-17: 112 ug via ORAL
  Filled 2022-09-16 (×2): qty 1

## 2022-09-16 MED ORDER — GADOBUTROL 1 MMOL/ML IV SOLN
5.6000 mL | Freq: Once | INTRAVENOUS | Status: AC | PRN
  Administered 2022-09-16: 5.6 mL via INTRAVENOUS

## 2022-09-16 MED ORDER — ASPIRIN 325 MG PO TABS
325.0000 mg | ORAL_TABLET | Freq: Every day | ORAL | Status: DC
  Administered 2022-09-16 – 2022-09-17 (×2): 325 mg via ORAL
  Filled 2022-09-16 (×2): qty 1

## 2022-09-16 MED ORDER — LORAZEPAM 2 MG/ML IJ SOLN: 1.0000 mg | Freq: Once | INTRAMUSCULAR | Status: DC

## 2022-09-16 MED ORDER — PANTOPRAZOLE SODIUM 40 MG PO TBEC
40.0000 mg | DELAYED_RELEASE_TABLET | Freq: Every day | ORAL | Status: DC
  Administered 2022-09-16 – 2022-09-17 (×2): 40 mg via ORAL
  Filled 2022-09-16 (×2): qty 1

## 2022-09-16 NOTE — Progress Notes (Signed)
[place] 1124  Dr. Quinn Axe paged 1130 Pt to CT 1135 Dr. Quinn Axe on camera 1137 No TNK decision at 1152  mRS 0 - pt lives at home and takes care of himself  LNW 0930

## 2022-09-16 NOTE — Plan of Care (Signed)

## 2022-09-16 NOTE — Consult Note (Signed)
NEUROLOGY TELECONSULTATION NOTE   Date of service: September 16, 2022 Patient Name: Angel NIVEN Sr. MRN:  [id number] DOB:  April 21, 1941 Reason for consult: L sided weakness  Requesting Provider: Dr. Deno Etienne Consult Participants: myself, patient, bedside RN, telestroke RN Location of the provider: [facility] Location of the patient: [facility]  This consult was provided via telemedicine with 2-way video and audio communication. The patient/family was informed that care would be provided in this way and agreed to receive care in this manner.   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  Last known well was 9:30 AM when his left leg gave out.  His contents had significantly improved.  He currently only has facial asymmetry and mild drift in his left upper extremity.  NIH stroke scale was 2.  Personal review of head CT shows no acute intracranial process.  Previously placed right MCA stent is noted.  TNK not administered 2/2 mild and rapidly improving sx at time of exam. CTA was not performed because exam was not consistent with LVO.  Patient was observed in the ED until he was outside of the TNK window at 1400 with no worsening of symptoms.   ROS   Per HPI; all other systems reviewed and are negative  Past History   The following was personally reviewed:  Past Medical History:  Diagnosis Date   Aneurysm ([place])    2017   Anxiety    Arthritis    Depression    Family history of breast cancer    Family history of lung cancer    Headache    HLD (hyperlipidemia)    Hx of inguinal hernia repair    Hypertension    Hypothyroidism    Nausea with vomiting 09/21/2021   Overactive  bladder    Pancreatic cancer ([facility])    Pneumonia    Shingles    Sleep apnea    Patient denies   Thoracic ascending aortic aneurysm ([place])    mildly dilated 4.0cm per 08/12/21 CT   Thyroid disease    Varicose veins of both lower extremities    Past Surgical History:  Procedure Laterality Date   BILATERAL CARPAL TUNNEL RELEASE     CATARACT EXTRACTION, BILATERAL Bilateral    ELBOW SURGERY Bilateral    ENDOSCOPIC RETROGRADE CHOLANGIOPANCREATOGRAPHY (ERCP) WITH PROPOFOL N/A 07/20/2021   Procedure: ENDOSCOPIC RETROGRADE CHOLANGIOPANCREATOGRAPHY (ERCP) WITH PROPOFOL;  Surgeon: Gatha Mayer, MD;  Location: [facility] ENDOSCOPY;  Service: Endoscopy;  Laterality: N/A;   ESOPHAGOGASTRODUODENOSCOPY (EGD) WITH PROPOFOL N/A 08/05/2021   Procedure: ESOPHAGOGASTRODUODENOSCOPY (EGD) WITH PROPOFOL;  Surgeon: Rush Landmark Telford Nab., MD;  Location: [place];  Service: Gastroenterology;  Laterality: N/A;   EUS N/A 08/05/2021   Procedure: UPPER ENDOSCOPIC ULTRASOUND (EUS) RADIAL;  Surgeon: Irving Copas., MD;  Location: [place];  Service: Gastroenterology;  Laterality: N/A;   EYE SURGERY     bilateral cataracts   FINE NEEDLE ASPIRATION  08/05/2021   Procedure: FINE NEEDLE ASPIRATION (FNA) LINEAR;  Surgeon: Irving Copas., MD;  Location: [facility] ENDOSCOPY;  Service: Gastroenterology;;   INGUINAL HERNIA REPAIR Right 1951   IR ANGIO INTRA EXTRACRAN SEL COM CAROTID INNOMINATE  BILAT MOD SED  02/20/2019   IR BILIARY STENT(S) EXISTING ACCESS INC DILATION CATH EXCHANGE  09/20/2021   IR ENDOLUMINAL BX OF BILIARY TREE  07/22/2021   IR GENERIC HISTORICAL  10/28/2016   IR RADIOLOGIST EVAL & MGMT 10/28/2016 [facility]INTER[facility] RAD   IR INT EXT BILIARY DRAIN WITH CHOLANGIOGRAM  07/22/2021   JOINT REPLACEMENT     LAPAROSCOPY N/A 11/15/2021   Procedure: LAPAROSCOPIC DIAGNOSTIC STAGING;  Surgeon: Dwan Bolt, MD;  Location: [place];  Service: General;  Laterality: N/A;   PORTACATH PLACEMENT N/A 08/19/2021    Procedure: INSERTION PORT-A-CATH;  Surgeon: Dwan Bolt, MD;  Location: [facility];  Service: General;  Laterality: N/A;  LMA   RADIOLOGY WITH ANESTHESIA N/A 10/21/2015   Procedure: RADIOLOGY WITH ANESTHESIA;  Surgeon: Luanne Bras, MD;  Location: [place];  Service: Radiology;  Laterality: N/A;   RADIOLOGY WITH ANESTHESIA N/A 02/20/2019   Procedure: [employer];  Surgeon: Luanne Bras, MD;  Location: [place];  Service: Radiology;  Laterality: N/A;   ROTATOR CUFF REPAIR Bilateral    VARICOSE VEIN SURGERY     WHIPPLE PROCEDURE N/A 11/15/2021   Procedure: WHIPPLE PROCEDURE;  Surgeon: Dwan Bolt, MD;  Location: [place];  Service: General;  Laterality: N/A;   Family History  Problem Relation Age of Onset   Breast cancer Mother    Lung cancer Father    Alcoholism Sister    Cirrhosis Sister    Colon cancer Neg Hx    Esophageal cancer Neg Hx    Liver cancer Neg Hx    Pancreatic cancer Neg Hx    Prostate cancer Neg Hx    Social History   Socioeconomic History   Marital status: Widowed    Spouse name: Not on file   Number of children: 5   Years of education: Not on file   Highest education level: Not on file  Occupational History   Occupation: retired  Tobacco Use   Smoking status: Former    Packs/day: 0.25    Years: 1.00    Total pack years: 0.25    Types: Cigarettes    Quit date: 08/15/1961    Years since quitting: 61.1   Smokeless tobacco: Never  Vaping Use   Vaping Use: Never used  Substance and Sexual Activity   Alcohol use: No   Drug use: No   Sexual activity: Not Currently  Other Topics Concern   Not on file  Social History Narrative   Not on file   Social Determinants of Health   Financial Resource Strain: High Risk (03/10/2022)   Overall Financial Resource Strain (CARDIA)    Difficulty of Paying Living Expenses: Hard  Food Insecurity: Not on file  Transportation Needs: Not on file  Physical Activity: Not on file  Stress: Not on file  Social  Connections: Not on file   No Known Allergies  Medications   (Not in a hospital admission)    No current facility-administered medications for this encounter.  Current Outpatient Medications:    acetaminophen (TYLENOL) 500 MG tablet, Take 1 tablet (500 mg total) by mouth every 6 (six) hours as needed for mild pain., Disp: 30 tablet, Rfl: 0   amoxicillin-clavulanate (AUGMENTIN) 875-125 MG tablet, Take 1 tablet by mouth 2 (two) times daily., Disp: 14 tablet, Rfl: 0   Cholecalciferol (VITAMIN D3) 50 MCG (2000 UT) TABS, Take 2,000 Units by mouth daily., Disp: , Rfl:    dexamethasone (DECADRON) 4 MG tablet, Take 8 mg by mouth daily., Disp: ,  Rfl:    docusate sodium (COLACE) 100 MG capsule, Take 1 capsule (100 mg total) by mouth 2 (two) times daily. (Patient not taking: Reported on 06/29/2022), Disp: 28 capsule, Rfl: 0   eltrombopag (PROMACTA) 50 MG tablet, Take 1 tablet (50 mg total) by mouth daily. Take on an empty stomach 1 hour before a meal or 2 hours after, Disp: 30 tablet, Rfl: 2   escitalopram (LEXAPRO) 20 MG tablet, Take 1 tablet (20 mg total) by mouth daily., Disp: 90 tablet, Rfl: 1   famotidine (PEPCID) 20 MG tablet, Take 1 tablet (20 mg total) by mouth 2 (two) times daily., Disp: 60 tablet, Rfl: 1   ferrous sulfate 325 (65 FE) MG EC tablet, Take 1 tablet (325 mg total) by mouth daily with breakfast. (Patient not taking: Reported on 06/29/2022), Disp: 30 tablet, Rfl: 3   levothyroxine (SYNTHROID) 112 MCG tablet, TAKE 1 TABLET (112 MCG TOTAL) BY MOUTH DAILY BEFORE BREAKFAST., Disp: 90 tablet, Rfl: 1   lidocaine-prilocaine (EMLA) cream, Apply topically to affected area(s) as needed., Disp: 30 g, Rfl: 0   lipase/protease/amylase (CREON) 36000 UNITS CPEP capsule, Take 1 capsule (36,000 Units total) by mouth 3 (three) times daily before meals., Disp: 180 capsule, Rfl: 3   lovastatin (MEVACOR) 20 MG tablet, Take 1 tablet (20 mg total) by mouth at bedtime., Disp: 90 tablet, Rfl: 0    mirtazapine (REMERON) 7.5 MG tablet, Take 1 tablet (7.5 mg total) by mouth at bedtime., Disp: 90 tablet, Rfl: 0   Multiple Vitamin (MULTIVITAMIN WITH MINERALS) TABS tablet, Take 1 tablet by mouth daily., Disp: 30 tablet, Rfl: 0   nystatin-diphenhydrAMINE-alum & mag hydroxide-simeth, Take 5 MLs by mouth 3 (three) times daily as needed for mouth pain., Disp: 120 mL, Rfl: 1   pantoprazole (PROTONIX) 40 MG tablet, Take 1 tablet by mouth daily., Disp: 90 tablet, Rfl: 0   polyethylene glycol (MIRALAX / GLYCOLAX) 17 g packet, Take 17 g by mouth daily as needed for moderate constipation. (Patient not taking: Reported on 06/29/2022), Disp: 14 each, Rfl: 0   prochlorperazine (COMPAZINE) 10 MG tablet, Take 1 tablet (10 mg total) by mouth every 6 (six) hours as needed (Nausea or vomiting)., Disp: 30 tablet, Rfl: 1   vitamin B-12 (CYANOCOBALAMIN) 1000 MCG tablet, Take 1,000 mcg by mouth 2 (two) times daily., Disp: , Rfl:   Vitals   Vitals:   09/16/22 1104 09/16/22 1108 09/16/22 1111  BP:   (!) 218/87  Pulse:   (!) 56  Resp:   18  Temp:   98.1 F (36.7 C)  TempSrc:   Oral  SpO2: 97%  99%  Weight:  56 kg   Height:  '5\' 10"'$  (1.778 m)      Body mass index is 17.71 kg/m.  Physical Exam   Exam performed over telemedicine with 2-way video and audio communication and with assistance of bedside RN  Physical Exam Gen: A&O x4, NAD Resp: normal WOB CV: extremities appear well-perfused  Neuro: *MS: A&O x4. Follows multi-step commands.  *Speech: nondysarthric, no aphasia, able to name and repeat *CN: PERRL 39m, EOMI, VFF by confrontation, L NLF flattening, smile symmetric, hearing intact to voice *Motor:   Normal bulk.  No tremor, rigidity or bradykinesia. LUE minimal drift, all other extremities appear full strength *Sensory: SILT. Symmetric. No double-simultaneous extinction.  *Coordination:  Finger-to-nose, heel-to-shin, rapid alternating motions were intact. *Reflexes:  UTA 2/2 tele-exam *Gait:  deferred  NIHSS = 2 for facial droop and LUE drift  Premorbid mRS =  2   Labs   CBC: No results for input(s): "WBC", "NEUTROABS", "HGB", "HCT", "MCV", "PLT" in the last 168 hours.  Basic Metabolic Panel:  Lab Results  Component Value Date   NA 138 09/05/2022   K 3.9 09/05/2022   CO2 30 09/05/2022   GLUCOSE 106 (H) 09/05/2022   BUN 16 09/05/2022   CREATININE 1.18 09/05/2022   CALCIUM 8.6 (L) 09/05/2022   GFRNONAA >60 09/05/2022   GFRAA 55 (L) 02/20/2019   Lipid Panel:  Lab Results  Component Value Date   LDLCALC (H) 07/10/2009    160        Total Cholesterol/HDL:CHD Risk Coronary Heart Disease Risk Table                     Men   Women  1/2 Average Risk   3.4   3.3  Average Risk       5.0   4.4  2 X Average Risk   9.6   7.1  3 X Average Risk  23.4   11.0        Use the calculated Patient Ratio above and the CHD Risk Table to determine the patient's CHD Risk.        ATP III CLASSIFICATION (LDL):  <100     mg/dL   Optimal  100-129  mg/dL   Near or Above                    Optimal  130-159  mg/dL   Borderline  160-189  mg/dL   High  >190     mg/dL   Very High   HgbA1c:  Lab Results  Component Value Date   HGBA1C 5.8 (H) 11/15/2021   Urine Drug Screen: No results found for: "LABOPIA", "COCAINSCRNUR", "LABBENZ", "AMPHETMU", "THCU", "LABBARB"  Alcohol Level     Component Value Date/Time   ETH <10 07/18/2021 1226     Impression   This is an 82 year old gentleman with past medical history significant for right MCA aneurysm treated with stent, pancreatic 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 NIHSS = 2. CT head no acute process. TNK not administered 2/2 mild and rapidly improving sx at time of exam. Patient was observed in the ED until he was outside of the TNK window at 1400 with no worsening of symptoms.  Recommendations    - Admit for stroke workup to [place] - Permissive HTN x48 hrs from sx onset or until stroke ruled out by MRI goal BP <220/110. PRN labetalol or hydralazine if BP above these parameters. Avoid oral antihypertensives. - MRI brain wo contrast - CTA or MRA H&N - TTE w/ bubble (given hx malignancy) - Check A1c and LDL + add statin per guidelines - ASA '81mg'$  daily + plavix '75mg'$  daily x21 days f/b ASA '81mg'$  daily monotherapy after that - q4 hr neuro checks - STAT head CT for any change in neuro exam - Tele - PT/OT/SLP - Stroke education - Amb referral to neurology upon discharge   Please notify neurology team upon patient's arrival to [facility]   ______________________________________________________________________   Thank you for the opportunity to take part in the care of this patient. If you have any further questions, please contact the neurology consultation attending.  Signed,  Su Monks, MD [facility] [phone]  If 7pm- 7am, please page neurology on call as listed in [place].  **  Any copied and pasted documentation in this note was written by me in another application not billed for and pasted by me into this document.

## 2022-09-16 NOTE — ED Notes (Signed)
Dr. Tyrone Nine aware patients BP 219/95.

## 2022-09-16 NOTE — ED Triage Notes (Signed)
Pt BIBA from home. Reports falll w/no injuries. No LOC. Pt states L leg gave out. C/o dizziness.  Radiation treatment yesterday  Aox4  BP: 192/86 HR: 60 SPO2: 97 RA CBG: 118

## 2022-09-16 NOTE — ED Notes (Signed)
[facility] called for transport. 

## 2022-09-16 NOTE — H&P (Signed)
History and Physical    Patient: Angel Keller KZS:[id number] DOB: [birth year] DOA: 09/16/2022 DOS: the patient was seen and examined on 09/16/2022 PCP: Ronnell Freshwater, NP  Patient coming from: Home - lives with daughter; NOK: Daughter, Ellsworth Lennox, [phone]   Chief Complaint: Lytle Michaels  HPI: Rashied Corallo. is a 82 y.o. male with medical history significant of HTN, HLD, hypothyroidism, pancreatic CA, and TAA presenting with left-sided weakness resulting in a fall.  He is receiving radiation treatment for pancreatic cancer with last treatment yesterday.   He reports that his "blood pressure is staying out of whack."  He was supposed to have rads today, ate breakfast and did some chores.  He sat on the side of the bed and slid to the floor, couldn't move, "almost like I was paralyzed."  He scooted around the bed and laid on the floor and eventually was able to pull himself up.  He walked to the bathroom and kitchen.  His left leg gave out.  It feels better now.  No speech difficulty. + swallow issue for about a week, "the pills was kinda going down hard."  He has been eating fine.  His left arm went numb as well.    He has pancreatic cancer, had a partial pancreatectomy and then found some cancer cells in his lymph nodes.  He just started radiation yesterday, scheduled for 10 days (without weekends).      ER Course:  Code stroke - did normal chores, sat on bed and slid to the ground.  Then didn't feel like he could move his L leg well.  +left-sided weakness.  Teleneuro consulted, no TNK.  He is recommended for admission for stroke evaluation.  Negative head CT.  Still has ongoing symptoms.     Review of Systems: As mentioned in the history of present illness. All other systems reviewed and are negative. Past Medical History:  Diagnosis Date   Anxiety    Arthritis    Depression    Family history of breast cancer    Family history of lung cancer    Headache    HLD (hyperlipidemia)     Hx of inguinal hernia repair    Hypertension    Hypothyroidism    Nausea with vomiting 09/21/2021   Overactive bladder    Pancreatic cancer (HCC)    Pneumonia    Shingles    Sleep apnea    Patient denies   Thoracic ascending aortic aneurysm ([place])    mildly dilated 4.0cm per 08/12/21 CT   Thyroid disease    Varicose veins of both lower extremities    Past Surgical History:  Procedure Laterality Date   BILATERAL CARPAL TUNNEL RELEASE     CATARACT EXTRACTION, BILATERAL Bilateral    ELBOW SURGERY Bilateral    ENDOSCOPIC RETROGRADE CHOLANGIOPANCREATOGRAPHY (ERCP) WITH PROPOFOL N/A 07/20/2021   Procedure: ENDOSCOPIC RETROGRADE CHOLANGIOPANCREATOGRAPHY (ERCP) WITH PROPOFOL;  Surgeon: Gatha Mayer, MD;  Location[facility] END[facility]COPY;  Service: Endoscopy;  Laterality: N/A;   ESOPHAGOGASTRODUODENOSCOPY (EGD) WITH PROPOFOL N/A 08/05/2021   Procedure: ESOPHAGOGASTRODUODENOSCOPY (EGD) WITH PROPOFOL;  Surgeon: Rush Landmark Telford Nab., MD;  Location: [place];  Service: Gastroenterology;  Laterality: N/A;   EUS N/A 08/05/2021   Procedure: UPPER ENDOSCOPIC ULTRASOUND (EUS) RADIAL;  Surgeon: Irving Copas., MD;  Location: [place];  Service: Gastroenterology;  Laterality: N/A;   EYE SURGERY     bilateral cataracts   FINE NEEDLE ASPIRATION  08/05/2021   Procedure: FINE NEEDLE ASPIRATION (FNA) LINEAR;  Surgeon: Rush Landmark Telford Nab., MD;  Location: [place];  Service: Gastroenterology;;   INGUINAL HERNIA REPAIR Right 1952   IR ANGIO INTRA EXTRACRAN SEL COM CAROTID INNOMINATE BILAT MOD SED  02/20/2019   IR BILIARY STENT(S) EXISTING ACCESS INC DILATION CATH EXCHANGE  09/20/2021   IR ENDOLUMINAL BX OF BILIARY TREE  07/22/2021   IR GENERIC HISTORICAL  10/28/2016   IR RADIOLOGIST EVAL & MGMT 10/28/2016 [facility]   IR INT EXT BILIARY DRAIN WITH CHOLANGIOGRAM  07/22/2021   JOINT REPLACEMENT     LAPAROSCOPY N/A 11/15/2021   Procedure: LAPAROSCOPIC DIAGNOSTIC STAGING;  Surgeon: Dwan Bolt, MD;  Location: [place];  Service: General;  Laterality: N/A;   PORTACATH PLACEMENT N/A 08/19/2021   Procedure: INSERTION PORT-A-CATH;  Surgeon: Dwan Bolt, MD;  Location: [facility];  Service: General;  Laterality: N/A;  LMA   RADIOLOGY WITH ANESTHESIA N/A 10/21/2015   Procedure: RADIOLOGY WITH ANESTHESIA;  Surgeon: Luanne Bras, MD;  Location: [place];  Service: Radiology;  Laterality: N/A;   RADIOLOGY WITH ANESTHESIA N/A 02/20/2019   Procedure: [employer];  Surgeon: Luanne Bras, MD;  Location: [place];  Service: Radiology;  Laterality: N/A;   ROTATOR CUFF REPAIR Bilateral    VARICOSE VEIN SURGERY     WHIPPLE PROCEDURE N/A 11/15/2021   Procedure: WHIPPLE PROCEDURE;  Surgeon: Dwan Bolt, MD;  Location: [place];  Service: General;  Laterality: N/A;   Social History:  reports that he quit smoking about 61 years ago. His smoking use included cigarettes. He has a 0.25 pack-year smoking history. He has never used smokeless tobacco. He reports that he does not drink alcohol and does not use drugs.  No Known Allergies  Family History  Problem Relation Age of Onset   Breast cancer Mother    Lung cancer Father    Alcoholism Sister    Cirrhosis Sister    Colon cancer Neg Hx    Esophageal cancer Neg Hx    Liver cancer Neg Hx    Pancreatic cancer Neg Hx    Prostate cancer Neg Hx     Prior to Admission medications   Medication Sig Start Date End Date Taking? Authorizing Provider  Cholecalciferol (VITAMIN D3) 50 MCG (2000 UT) TABS Take 2,000 Units by mouth daily.   Yes [provider]  eltrombopag (PROMACTA) 50 MG tablet Take 1 tablet (50 mg total) by mouth daily. Take on an empty stomach 1 hour before a meal or 2 hours after 08/19/22  Yes Truitt Merle, MD  escitalopram (LEXAPRO) 20 MG tablet Take 1 tablet (20 mg total) by mouth daily. 11/08/21  Yes Boscia, Greer Ee, NP  levothyroxine (SYNTHROID) 112 MCG tablet TAKE 1 TABLET (112 MCG TOTAL) BY MOUTH DAILY BEFORE  BREAKFAST. Patient taking differently: Take 112 mcg by mouth daily before breakfast. 03/28/22  Yes Boscia, Heather E, NP  lidocaine-prilocaine (EMLA) cream Apply topically to affected area(s) as needed. Patient taking differently: Apply 1 Application topically as needed (port). 08/24/22  Yes Hayden Pedro, PA-C  lipase/protease/amylase (CREON) 36000 UNITS CPEP capsule Take 1 capsule (36,000 Units total) by mouth 3 (three) times daily before meals. Patient taking differently: Take 72,000 Units by mouth 3 (three) times daily before meals. 08/24/22  Yes Truitt Merle, MD  lovastatin (MEVACOR) 20 MG tablet Take 1 tablet (20 mg total) by mouth at bedtime. 05/18/22  Yes Boscia, Heather E, NP  mirtazapine (REMERON) 7.5 MG tablet Take 1 tablet (7.5 mg total) by mouth at bedtime. 05/18/22  Yes Boscia, Heather E, NP  pantoprazole (PROTONIX) 40 MG tablet Take 1 tablet by mouth daily. 08/24/22  Yes Truitt Merle, MD  prochlorperazine (COMPAZINE) 10 MG tablet Take 1 tablet (10 mg total) by mouth every 6 (six) hours as needed (Nausea or vomiting). 06/09/22  Yes Truitt Merle, MD  vitamin B-12 (CYANOCOBALAMIN) 1000 MCG tablet Take 1,000 mcg by mouth 2 (two) times daily.   Yes [provider]  amoxicillin-clavulanate (AUGMENTIN) 875-125 MG tablet Take 1 tablet by mouth 2 (two) times daily. Patient not taking: Reported on 09/16/2022 08/02/22   Truitt Merle, MD  dexamethasone (DECADRON) 4 MG tablet Take 8 mg by mouth daily. Patient not taking: Reported on 09/16/2022    [provider]  nystatin-diphenhydrAMINE-alum & mag hydroxide-simeth Take 5 MLs by mouth 3 (three) times daily as needed for mouth pain. Patient not taking: Reported on 09/16/2022 08/24/22   Truitt Merle, MD    Physical Exam: Vitals:   09/16/22 1346 09/16/22 1400 09/16/22 1515 09/16/22 1518  BP: (!) 216/98 (!) 219/95 (!) 211/100 (!) 211/100  Pulse: 68 70 67 70  Resp: 19 (!) '23 16 20  '$ Temp:    98.2 F (36.8 C)  TempSrc:    Oral  SpO2: 99% 100%  99% 97%  Weight:      Height:       General:  Appears calm and comfortable and is in NAD Eyes:  PERRL, EOMI, normal lids, iris ENT:  grossly normal hearing, lips & tongue, mmm; some absent dentition Neck:  no LAD, masses or thyromegaly Cardiovascular:  RRR, no m/r/g. No LE edema.  Respiratory:   CTA bilaterally with no wheezes/rales/rhonchi.  Normal respiratory effort. Abdomen:  soft, NT, ND Skin:  no rash or induration seen on limited exam Musculoskeletal:  subtle LUE and LLE weakness Psychiatric:  blunted mood and affect, speech fluent and appropriate, AOx3 Neurologic:  CN 2-12 grossly intact with subtle L facial droop, moves all extremities in coordinated fashion, subtle LUE and LLE weakness   Radiological Exams on Admission: Independently reviewed - see discussion in A/P where applicable  CT HEAD CODE STROKE WO CONTRAST  Result Date: 09/16/2022 CLINICAL DATA:  Code stroke. Neuro deficit, acute, stroke suspected at EXAM: CT HEAD WITHOUT CONTRAST TECHNIQUE: Contiguous axial images were obtained from the base of the skull through the vertex without intravenous contrast. RADIATION DOSE REDUCTION: This exam was performed according to the departmental dose-optimization program which includes automated exposure control, adjustment of the mA and/or kV according to patient size and/or use of iterative reconstruction technique. COMPARISON:  CT head February 10, 2020. FINDINGS: Mildly motion limited study. Brain: No evidence of acute large vascular territory infarct or acute hemorrhage. Remote right occipital infarct. No evidence of mass lesion, midline shift or hydrocephalus. Patchy white matter hypodensities, nonspecific but compatible with chronic microvascular ischemic disease. Streak artifact through the posterior fossa and right greater than left temporal lobes. Vascular: Right MCA stent.  No visible hyperdense vessel. Skull: No acute fracture. Sinuses/Orbits: Inferior left maxillary sinus mucosal  thickening. No acute orbital findings. ASPECTS [facility] Stroke Program Early CT Score) total score (0-10 with 10 being normal): 10. IMPRESSION: 1. No evidence of acute intracranial abnormality. ASPECTS is 10. 2. 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 3. Right MCA stent. Findings discussed with Dr. Quinn Axe via telephone at 11:56 AM. Electronically Signed   By: Margaretha Sheffield M.D.   On: 09/16/2022 11:57    EKG: Independently reviewed.  Sinus bradycardia with rate 58;  no evidence of acute ischemia   Labs on Admission: I have personally reviewed the available labs and imaging studies at the time of the admission.  Pertinent labs:    Unremarkable CMP Albumin 3.1 WBC 3.4 - stable Hgb 9.8 Platelets 127 INR 1.3 UA: moderate Hgb ETOH <10 UDS negative   Assessment and Plan: Principal Problem:   Acute left-sided weakness Active Problems:   Acquired hypothyroidism   HLD (hyperlipidemia)   Anxiety   Essential hypertension   Pancreatic cancer (HCC)   Thrombocytopenia (HCC)   MDS (myelodysplastic syndrome) (HCC)    Concern for Acute CVA -Patient with acute onset of L-sided weakness today resulting in a fall, concerning for CVA -Interestingly, he did just start radiation therapy for pancreatic CA yesterday - but this currently appears to be unrelated -Aspirin has been given to reduce stroke mortality and decrease morbidity -Will place in observation status for CVA/TIA evaluation at [facility] -Telemetry monitoring -MRI/MRA -Echo with bubble given h/o malignancy (per neurology) -Risk stratification with FLP, A1c (no h/o DM but given pancreatectomy he is at risk for developing) -Neurology consult -PT/OT/ST/Nutrition Consults  Pancreatic cancer -Diagnosed in 12/22 with T2 N0 M0 pancreatic adenocarcinoma -s/p Whipple following neoadjuvant chemotherapy -He then was treated with adjuvant chemotherapy -Recent imaging with 1.3 cm tumor anterior to the aorta near  the surgical cavity, not amenable to biopsy -Planned for SBRT for this lesion -Initial treatment was yesterday -This is currently thought to be unrelated to presentation but will notify rad onc of admission; Secure Chat sent to Dr. Lisbeth Renshaw -Continue Creon  HTN -Allow permissive HTN for now -Treat BP only if >220/120, and then with goal of 15% reduction -Hold ARB and plan to restart in 48-72 hours   HLD -Check FLP -Resume statin but will change lovastatin to Lipitor 40 mg daily  MDS -Also with MDS, worsening thrombocytopenia in 07/2022 requiring platelet transfusion -He responded well to Nplate -He has transitioned to Promacta orally  -Platelets appear to be stable -Will continue Promacta -Hold Lovenox and use SCDs for now  Mood d/o -Continue escitalopram, mirtazepine  Hypothyroidism -Continue Synthroid    Advance Care Planning:   Code Status: Full Code - Code status was discussed with the patient at the time of admission.  The patient would want to receive full resuscitative measures at this time.   Consults: Neurology; PT/OT/ST/Nutrition; TOC team  DVT Prophylaxis: SCDs (chronic thrombocytopenia)  Family Communication: Son was present at the the conclusion of the evaluation  Severity of Illness: The appropriate patient status for this patient is OBSERVATION. Observation status is judged to be reasonable and necessary in order to provide the required intensity of service to ensure the patient's safety. The patient's presenting symptoms, physical exam findings, and initial radiographic and laboratory data in the context of their medical condition is felt to place them at decreased risk for further clinical deterioration. Furthermore, it is anticipated that the patient will be medically stable for discharge from the hospital within 2 midnights of admission.   Author: Karmen Bongo, MD 09/16/2022 3:26 PM  For on call review [email or web address].

## 2022-09-16 NOTE — ED Provider Notes (Signed)
[place] EMERGENCY DEPARTMENT AT [facility] Provider Note   CSN: [id number] Arrival date & time: 09/16/22  1058     History  Chief Complaint  Patient presents with   Dizziness   Fall    Angel Keller. is a 82 y.o. male.  82 yo M with a chief complaint of fall.  Patient tells me that he was normal this morning was doing what he does normally in the morning.  He then sat on the side the bed did not for some reason felt like he could not hold himself up and slid to the ground.  Was able to crawl around to the other side and then eventually stood up realized at that time as left leg was a bit weak.  He was able to ambulate on it for period of time and then it gave out from under him and he collapsed again to the ground.  He denies any injury in the fall.  Denies back pain.  Denies leg pain.  Still feels like his leg is weak.  He thinks this was about 2 hours ago.   Dizziness Fall       Home Medications Prior to Admission medications   Medication Sig Start Date End Date Taking? Authorizing Provider  Cholecalciferol (VITAMIN D3) 50 MCG (2000 UT) TABS Take 2,000 Units by mouth daily.   Yes [provider]  eltrombopag (PROMACTA) 50 MG tablet Take 1 tablet (50 mg total) by mouth daily. Take on an empty stomach 1 hour before a meal or 2 hours after 08/19/22  Yes Truitt Merle, MD  escitalopram (LEXAPRO) 20 MG tablet Take 1 tablet (20 mg total) by mouth daily. 11/08/21  Yes Boscia, Greer Ee, NP  levothyroxine (SYNTHROID) 112 MCG tablet TAKE 1 TABLET (112 MCG TOTAL) BY MOUTH DAILY BEFORE BREAKFAST. Patient taking differently: Take 112 mcg by mouth daily before breakfast. 03/28/22  Yes Boscia, Heather E, NP  lidocaine-prilocaine (EMLA) cream Apply topically to affected area(s) as needed. Patient taking differently: Apply 1 Application topically as needed (port). 08/24/22  Yes Hayden Pedro, PA-C  lipase/protease/amylase (CREON) 36000 UNITS CPEP capsule Take 1  capsule (36,000 Units total) by mouth 3 (three) times daily before meals. Patient taking differently: Take 72,000 Units by mouth 3 (three) times daily before meals. 08/24/22  Yes Truitt Merle, MD  lovastatin (MEVACOR) 20 MG tablet Take 1 tablet (20 mg total) by mouth at bedtime. 05/18/22  Yes Boscia, Heather E, NP  mirtazapine (REMERON) 7.5 MG tablet Take 1 tablet (7.5 mg total) by mouth at bedtime. 05/18/22  Yes Boscia, Heather E, NP  pantoprazole (PROTONIX) 40 MG tablet Take 1 tablet by mouth daily. 08/24/22  Yes Truitt Merle, MD  prochlorperazine (COMPAZINE) 10 MG tablet Take 1 tablet (10 mg total) by mouth every 6 (six) hours as needed (Nausea or vomiting). 06/09/22  Yes Truitt Merle, MD  vitamin B-12 (CYANOCOBALAMIN) 1000 MCG tablet Take 1,000 mcg by mouth 2 (two) times daily.   Yes [provider]      Allergies    Patient has no known allergies.    Review of Systems   Review of Systems  Neurological:  Positive for dizziness.    Physical Exam Updated Vital Signs BP (!) 211/100   Pulse 70   Temp 98.2 F (36.8 C) (Oral)   Resp 20   Ht '5\' 10"'$  (1.778 m)   Wt 56 kg   SpO2 97%   BMI 17.71 kg/m  Physical Exam  Vitals and nursing note reviewed.  Constitutional:      Appearance: He is well-developed.  HENT:     Head: Normocephalic and atraumatic.  Eyes:     Pupils: Pupils are equal, round, and reactive to light.  Neck:     Vascular: No JVD.  Cardiovascular:     Rate and Rhythm: Normal rate and regular rhythm.     Heart sounds: No murmur heard.    No friction rub. No gallop.  Pulmonary:     Effort: No respiratory distress.     Breath sounds: No wheezing.  Abdominal:     General: There is no distension.     Tenderness: There is no abdominal tenderness. There is no guarding or rebound.  Musculoskeletal:        General: Normal range of motion.     Cervical back: Normal range of motion and neck supple.  Skin:    Coloration: Skin is not pale.     Findings: No rash.   Neurological:     Mental Status: He is alert and oriented to person, place, and time.     Comments: Patient does not is smooth with finger-to-nose on the left upper extremity as compared to the right.  He does have 4-5 muscle strength of the left lower extremity when compared to 5 out of 5 to the right.  Psychiatric:        Behavior: Behavior normal.     ED Results / Procedures / Treatments   Labs (all labs ordered are listed, but only abnormal results are displayed) Labs Reviewed  PROTIME-INR - Abnormal; Notable for the following components:      Result Value   Prothrombin Time 16.4 (*)    INR 1.3 (*)    All other components within normal limits  APTT - Abnormal; Notable for the following components:   aPTT >200 (*)    All other components within normal limits  CBC - Abnormal; Notable for the following components:   WBC 3.4 (*)    RBC 3.49 (*)    Hemoglobin 9.8 (*)    HCT 30.8 (*)    Platelets 127 (*)    All other components within normal limits  COMPREHENSIVE METABOLIC PANEL - Abnormal; Notable for the following components:   Calcium 8.4 (*)    Total Protein 6.3 (*)    Albumin 3.1 (*)    All other components within normal limits  URINALYSIS, ROUTINE W REFLEX MICROSCOPIC - Abnormal; Notable for the following components:   Color, Urine STRAW (*)    Hgb urine dipstick MODERATE (*)    All other components within normal limits  I-STAT CHEM 8, ED - Abnormal; Notable for the following components:   Hemoglobin 10.5 (*)    HCT 31.0 (*)    All other components within normal limits  ETHANOL  DIFFERENTIAL  RAPID URINE DRUG SCREEN, HOSP PERFORMED  HEMOGLOBIN A1C    EKG EKG Interpretation  Date/Time:  Friday September 16 2022 11:12:59 EST Ventricular Rate:  58 PR Interval:  149 QRS Duration: 84 QT Interval:  479 QTC Calculation: 471 R Axis:   52 Text Interpretation: Sinus or ectopic atrial rhythm Atrial premature complex Abnormal R-wave progression, early transition  Borderline T abnormalities, lateral leads Borderline ST elevation, anterolateral leads Baseline wander in lead(s) V6 Otherwise no significant change Confirmed by Deno Etienne [phone]) on 09/16/2022 11:45:36 AM  Radiology CT HEAD CODE STROKE WO CONTRAST  Result Date: 09/16/2022 CLINICAL DATA:  Code stroke. Neuro deficit, acute,  stroke suspected at EXAM: CT HEAD WITHOUT CONTRAST TECHNIQUE: Contiguous axial images were obtained from the base of the skull through the vertex without intravenous contrast. RADIATION DOSE REDUCTION: This exam was performed according to the departmental dose-optimization program which includes automated exposure control, adjustment of the mA and/or kV according to patient size and/or use of iterative reconstruction technique. COMPARISON:  CT head February 10, 2020. FINDINGS: Mildly motion limited study. Brain: No evidence of acute large vascular territory infarct or acute hemorrhage. Remote right occipital infarct. No evidence of mass lesion, midline shift or hydrocephalus. Patchy white matter hypodensities, nonspecific but compatible with chronic microvascular ischemic disease. Streak artifact through the posterior fossa and right greater than left temporal lobes. Vascular: Right MCA stent.  No visible hyperdense vessel. Skull: No acute fracture. Sinuses/Orbits: Inferior left maxillary sinus mucosal thickening. No acute orbital findings. ASPECTS [facility] Stroke Program Early CT Score) total score (0-10 with 10 being normal): 10. IMPRESSION: 1. No evidence of acute intracranial abnormality. ASPECTS is 10. 2. 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 3. Right MCA stent. Findings discussed with Dr. Quinn Axe via telephone at 11:56 AM. Electronically Signed   By: Margaretha Sheffield M.D.   On: 09/16/2022 11:57    Procedures .Critical Care  Performed by: Deno Etienne, DO Authorized by: Deno Etienne, DO   Critical care provider statement:    Critical care time (minutes):   35   Critical care time was exclusive of:  Separately billable procedures and treating other patients   Critical care was time spent personally by me on the following activities:  Development of treatment plan with patient or surrogate, discussions with consultants, evaluation of patient's response to treatment, examination of patient, ordering and review of laboratory studies, ordering and review of radiographic studies, ordering and performing treatments and interventions, pulse oximetry, re-evaluation of patient's condition and review of old charts   Care discussed with: admitting provider       Medications Ordered in ED Medications  atorvastatin (LIPITOR) tablet 40 mg (has no administration in time range)  escitalopram (LEXAPRO) tablet 20 mg (20 mg Oral Given 09/16/22 1557)  mirtazapine (REMERON) tablet 7.5 mg (has no administration in time range)  levothyroxine (SYNTHROID) tablet 112 mcg (has no administration in time range)  lipase/protease/amylase (CREON) capsule 72,000 Units (has no administration in time range)  pantoprazole (PROTONIX) EC tablet 40 mg (40 mg Oral Given 09/16/22 1558)  eltrombopag (PROMACTA) tablet 50 mg (50 mg Oral Not Given 09/16/22 1604)  acetaminophen (TYLENOL) tablet 650 mg (has no administration in time range)    Or  acetaminophen (TYLENOL) 160 MG/5ML solution 650 mg (has no administration in time range)    Or  acetaminophen (TYLENOL) suppository 650 mg (has no administration in time range)  senna-docusate (Senokot-S) tablet 1 tablet (has no administration in time range)  aspirin suppository 300 mg ( Rectal See Alternative 09/16/22 1557)    Or  aspirin tablet 325 mg (325 mg Oral Given 09/16/22 1557)  midazolam (VERSED) injection 1 mg (has no administration in time range)  hydrALAZINE (APRESOLINE) injection 5 mg (has no administration in time range)   stroke: early stages of recovery book ( Does not apply Given 09/16/22 1604)    ED Course/ Medical Decision Making/  A&P                             Medical Decision Making Amount and/or Complexity of Data Reviewed Labs: ordered. Radiology:  ordered.  Risk Decision regarding hospitalization.   66 yoM with a chief complaint of a fall.  On my history of more concerned about his acute left lower extremity weakness.  He has been hypertensive on arrival here as well.  Code stroke initiated.  Seen acutely by teleneurology per hospital protocol.  Felt to have a low NIHSS score.  Will hold off on thrombolytics at this time.  Medical admission.  No significant changes while being up in the ED.  Discussed with medicine for admission.        Final Clinical Impression(s) / ED Diagnoses Final diagnoses:  Acute CVA (cerebrovascular accident) [facility])    Rx / [place] Orders ED Discharge Orders     None         Deno Etienne, DO 09/16/22 1610

## 2022-09-16 NOTE — Telephone Encounter (Signed)
Cancelled 02/05 appointment due to patient being hospitalized.

## 2022-09-17 ENCOUNTER — Observation Stay (HOSPITAL_BASED_OUTPATIENT_CLINIC_OR_DEPARTMENT_OTHER): Payer: Medicare HMO

## 2022-09-17 DIAGNOSIS — R531 Weakness: Secondary | ICD-10-CM | POA: Diagnosis not present

## 2022-09-17 DIAGNOSIS — I6389 Other cerebral infarction: Secondary | ICD-10-CM

## 2022-09-17 LAB — LIPID PANEL
Cholesterol: 130 mg/dL (ref 0–200)
HDL: 59 mg/dL (ref >40)
LDL Cholesterol: 61 mg/dL (ref 0–99)
Total CHOL/HDL Ratio: 2.2 RATIO
Triglycerides: 49 mg/dL (ref <150)
VLDL: 10 mg/dL (ref 0–40)

## 2022-09-17 LAB — ECHOCARDIOGRAM COMPLETE BUBBLE STUDY
Area-P 1/2: 1.4 cm2
Calc EF: 68.8 %
S' Lateral: 3.5 cm
Single Plane A2C EF: 69.2 %
Single Plane A4C EF: 66.5 %

## 2022-09-17 MED ORDER — HEPARIN SOD (PORK) LOCK FLUSH 100 UNIT/ML IV SOLN
500.0000 [IU] | INTRAVENOUS | Status: AC | PRN
  Administered 2022-09-17: 500 [IU]

## 2022-09-17 MED ORDER — ATORVASTATIN CALCIUM 40 MG PO TABS: 40.0000 mg | ORAL_TABLET | Freq: Every day | ORAL | 3 refills | Status: DC

## 2022-09-17 MED ORDER — BOOST / RESOURCE BREEZE PO LIQD CUSTOM
1.0000 | Freq: Three times a day (TID) | ORAL | Status: DC
  Administered 2022-09-17: 1 via ORAL

## 2022-09-17 MED ORDER — ASPIRIN 81 MG PO TBEC: 81.0000 mg | DELAYED_RELEASE_TABLET | Freq: Every day | ORAL | Status: DC

## 2022-09-17 MED ORDER — AMLODIPINE BESYLATE 5 MG PO TABS: 5.0000 mg | ORAL_TABLET | Freq: Every day | ORAL | 1 refills | Status: DC

## 2022-09-17 MED ORDER — ASPIRIN 81 MG PO TBEC: 81.0000 mg | DELAYED_RELEASE_TABLET | Freq: Every day | ORAL | 12 refills | Status: DC

## 2022-09-17 NOTE — Evaluation (Signed)
Occupational Therapy Evaluation Patient Details Name: Angel Keller Sr. MRN: [id number] DOB: 08-Nov-1940 Today's Date: 09/17/2022   History of Present Illness Pt is an 82 y.o. male presenting to [facility] ED 2/2 with LLE weakness after falling at home. Transferred to [facility] for further stroke work up. 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 NIHSS=2 PMH significant for right MCA aneurysm treated with stent, pancreatic cancer on radiation, thoracic ascending aortic aneurysm, thyroid disease, depression, hyperlipidemia, and hypertension.   Clinical Impression   PTA, pt lived with daughter and was independent, but did not usually drive. Pt presenting with decreased LUE strength, coordination, balance, safety, awareness, insight, and problem solving. Upon eval, pt requiers up to min A for transfers and LB ADL. Pt with increased difficulty with LUE coordination with fatigue, and requiring assist to open all items on food tray. Pt reports daughter lives with him and is with him 24/7 as well as able to provide physical assist. Recommending discharge home with daughter and continued therapy with [place].      Recommendations for follow up therapy are one component of a multi-disciplinary discharge planning process, led by the attending physician.  Recommendations may be updated based on patient status, additional functional criteria and insurance authorization.   Follow Up Recommendations  Home health OT (pending progress or daughter ability to provide physical assist as needed)     Assistance Recommended at Discharge Intermittent Supervision/Assistance  Patient can return home with the following A little help with walking and/or transfers;A little help with bathing/dressing/bathroom;Help with stairs or ramp for entrance;Assist for transportation;Direct supervision/assist for medications management;Direct supervision/assist for financial management;Assistance with cooking/housework     Functional Status Assessment  Patient has had a recent decline in their functional status and demonstrates the ability to make significant improvements in function in a reasonable and predictable amount of time.  Equipment Recommendations  BSC/3in1    Recommendations for Other Services       Precautions / Restrictions Precautions Precautions: Fall      Mobility Bed Mobility Overal bed mobility: Modified Independent             General bed mobility comments: exit to R side of bed; incr effort and time    Transfers Overall transfer level: Needs assistance Equipment used: Rolling walker (2 wheels), None Transfers: Sit to/from Stand Sit to Stand: Min assist, Min guard           General transfer comment: from bed to RW with cues for technique ; up to min A to steady after rise      Balance Overall balance assessment: Needs assistance Sitting-balance support: No upper extremity supported, Feet supported Sitting balance-Leahy Scale: Fair     Standing balance support: Bilateral upper extremity supported, Reliant on assistive device for balance Standing balance-Leahy Scale: Poor                             ADL either performed or assessed with clinical judgement   ADL Overall ADL's : Needs assistance/impaired Eating/Feeding: Sitting;Minimal assistance Eating/Feeding Details (indicate cue type and reason): assist to open packages and for bilateral tasks (cutting up spaghetti) Grooming: Wash/dry hands;Minimal assistance;Standing Grooming Details (indicate cue type and reason): for balance; observing decr LUE coordination Upper Body Bathing: Set up;Sitting   Lower Body Bathing: Minimal assistance;Sit to/from stand   Upper Body Dressing : Set up;Sitting   Lower Body Dressing: Minimal assistance;Sit to/from stand   Toilet Transfer:  Minimal assistance;Rolling walker (2 wheels)           Functional mobility during ADLs: Min guard;Minimal  assistance;Rolling walker (2 wheels) General ADL Comments: up to min A during functional mobility for balance     Vision Baseline Vision/History: 0 No visual deficits Ability to See in Adequate Light: 0 Adequate Patient Visual Report: No change from baseline Vision Assessment?: No apparent visual deficits Additional Comments: able to locate all items on food tray     Perception     Praxis      Pertinent Vitals/Pain Pain Assessment Pain Assessment: No/denies pain     Hand Dominance Right   Extremity/Trunk Assessment Upper Extremity Assessment Upper Extremity Assessment: Generalized weakness;LUE deficits/detail LUE Deficits / Details: 3+/5 grip strength and push/pull/; 4-/5 shoulder flexion, decr coordination "clumsy", difficulty using functionally with fatigue. decr coordination with dysdiadokinesia testing LUE Coordination: decreased fine motor;decreased gross motor   Lower Extremity Assessment Lower Extremity Assessment: Defer to PT evaluation LLE Deficits / Details: knee extension 3+, ankle DF 4 LLE Sensation: decreased light touch   Cervical / Trunk Assessment Cervical / Trunk Assessment: Normal   Communication Communication Communication: HOH   Cognition Arousal/Alertness: Awake/alert Behavior During Therapy: WFL for tasks assessed/performed Overall Cognitive Status: Impaired/Different from baseline Area of Impairment: Safety/judgement, Awareness                         Safety/Judgement: Decreased awareness of deficits, Decreased awareness of safety Awareness: Intellectual   General Comments: poor insight/judgement of fatigue level as well as safety with RW; pt also dizzy and did not see need to sit down.     General Comments  vss    Exercises     Shoulder Instructions      Home Living Family/patient expects to be discharged to:: Private residence Living Arrangements: Children Available Help at Discharge: Family;Available 24 hours/day  (daughter) Type of Home: House Home Access: Stairs to enter [employer] of Steps: 3 Entrance Stairs-Rails: Left;Right Home Layout: One level     Bathroom Shower/Tub: Teacher, early years/pre: Standard Bathroom Accessibility: Yes   Home Equipment: Conservation officer, nature (2 wheels)          Prior Functioning/Environment Prior Level of Function : Needs assist             Mobility Comments: independent with ambulation; works out 3 days/week at home with weights ADLs Comments: daughter drives him and does IADLs ; Ind with ADLs        OT Problem List: Decreased strength;Decreased activity tolerance;Impaired balance (sitting and/or standing);Decreased coordination;Decreased cognition;Decreased range of motion;Decreased safety awareness;Decreased knowledge of use of DME or AE;Impaired UE functional use      OT Treatment/Interventions: Self-care/ADL training;Therapeutic exercise;DME and/or AE instruction;Balance training;Patient/family education;Therapeutic activities;Cognitive remediation/compensation    OT Goals(Current goals can be found in the care plan section) Acute Rehab OT Goals Patient Stated Goal: go home OT Goal Formulation: With patient Time For Goal Achievement: 10/01/22 Potential to Achieve Goals: Good  OT Frequency: Min 2X/week    Co-evaluation              AM-PAC OT "6 Clicks" Daily Activity     Outcome Measure Help from another person eating meals?: A Little Help from another person taking care of personal grooming?: A Little Help from another person toileting, which includes using toliet, bedpan, or urinal?: A Little Help from another person bathing (including washing, rinsing, drying)?: A Little Help from another person  to put on and taking off regular upper body clothing?: A Little Help from another person to put on and taking off regular lower body clothing?: A Little 6 Click Score: 18   End of Session Equipment Utilized During  Treatment: Gait belt;Rolling walker (2 wheels) Nurse Communication: Mobility status  Activity Tolerance: Patient tolerated treatment well Patient left: in bed;with call bell/phone within reach;with bed alarm set  OT Visit Diagnosis: Unsteadiness on feet (R26.81);Other abnormalities of gait and mobility (R26.89);Muscle weakness (generalized) (M62.81);Other symptoms and signs involving cognitive function                Time: 1201-1227 OT Time Calculation (min): 26 min Charges:  OT General Charges $OT Visit: 1 Visit OT Evaluation $OT Eval Moderate Complexity: 1 Mod OT Treatments $Self Care/Home Management : 8-22 mins  Elder Cyphers, OTR/L [facility] Acute Rehabilitation Office: [phone]   Magnus Ivan 09/17/2022, 1:43 PM

## 2022-09-17 NOTE — Progress Notes (Signed)
PT Cancellation Note  Patient Details Name: Angel GERMANO Sr. MRN: [id number] DOB: 04-03-1941   Cancelled Treatment:    Reason Eval/Treat Not Completed: Patient at procedure or test/unavailable  Will return this morning to evaluate   [place]  Office [phone]  Rexanne Mano 09/17/2022, 9:12 AM

## 2022-09-17 NOTE — Progress Notes (Signed)
Initial Nutrition Assessment  DOCUMENTATION CODES:   Underweight  INTERVENTION:   - Liberalize diet to Regular as pt is currently taking Creon  - Boost Breeze po TID, each supplement provides 250 kcal and 9 grams of protein  NUTRITION DIAGNOSIS:   Increased nutrient needs related to cancer and cancer related treatments as evidenced by estimated needs.  GOAL:   Patient will meet greater than or equal to 90% of their needs  MONITOR:   PO intake, Supplement acceptance, Labs, Weight trends, Skin  REASON FOR ASSESSMENT:   Malnutrition Screening Tool, Consult Assessment of nutrition requirement/status  ASSESSMENT:   82 year old male who presented to the ED on 2/02 after a fall and left-sided weakness. 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 Pt admitted with acute stroke.  RD working remotely. Attempted to speak with pt via phone call to room; however, no answer x 2.  Pt has been seen by [place] team in the past due to malnutrition and weight loss. [place] RD notes. Last RD note is from 06/09/22. Pt met criteria for severe malnutrition at that time and was experiencing nausea, diarrhea, and abdominal pain. Pt was unable to afford Creon. Pt also was not tolerating Ensure supplements and education was provided regarding clear liquid oral nutrition supplements. RD to order Boost Breeze TID during admission. Will liberalize diet to Regular given pt currently on Creon this admission.  Pt likely with ongoing severe malnutrition given BMI of 17.7. Reviewed weight history in chart. Pt has lost 11.6 kg since 11/17/21. This is a 17.2% weight loss in 10 months which is severe and significant for timeframe.  Meal Completion: 90% x 1 documented meal  Medications reviewed and include: creon 72,000 units  TID before meals, remeron 7.5 mg daily, protonix  Labs reviewed: platelets 127  NUTRITION - FOCUSED PHYSICAL EXAM:  Unable to complete at this time. RD working remotely.  Diet Order:   Diet Order             Diet regular Room service appropriate? Yes; Fluid consistency: Thin  Diet effective now                   EDUCATION NEEDS:   Not appropriate for education at this time  Skin:  Skin Assessment: Skin Integrity Issues: Stage I: sacrum  Last BM:  09/15/22  Height:   Ht Readings from Last 1 Encounters:  09/16/22 '5\' 10"'$  (1.778 m)    Weight:   Wt Readings from Last 1 Encounters:  09/16/22 56 kg    Ideal Body Weight:  75.5 kg  BMI:  Body mass index is 17.71 kg/m.  Estimated Nutritional Needs:   Kcal:  1850-2050  Protein:  85-100 grams  Fluid:  1.8-2.0 L    Gustavus Bryant, MS, RD, LDN Inpatient Clinical Dietitian Please see AMiON for contact information.

## 2022-09-17 NOTE — Progress Notes (Signed)
  Echocardiogram 2D Echocardiogram has been performed.  Angel Keller 09/17/2022, 11:39 AM

## 2022-09-17 NOTE — Progress Notes (Addendum)
STROKE TEAM PROGRESS NOTE   INTERVAL HISTORY No family at the bedside.   Patient admitted to [facility] for stroke work-up after experiencing transient left-sided weakness and left facial droop. No TNK or interventions given. Symptoms resolved rapidly.   On assessment, patient working with PT, ambulating with walker and assistance. Left-sided weakness increases with use, leg>arm.   Vitals:   09/17/22 0000 09/17/22 0334 09/17/22 0756 09/17/22 1151  BP: (!) 222/107 139/76 (!) 176/99 129/76  Pulse: 76 78 74 66  Resp: '16 17 18 17  '$ Temp: 97.8 F (36.6 C) 97.8 F (36.6 C) 97.6 F (36.4 C) (!) 97.3 F (36.3 C)  TempSrc: Oral Oral Oral Oral  SpO2: 99% 100% 96% 100%  Weight:      Height:       CBC:  Recent Labs  Lab 09/16/22 1216 09/16/22 1225  WBC 3.4*  --   NEUTROABS 2.3  --   HGB 9.8* 10.5*  HCT 30.8* 31.0*  MCV 88.3  --   PLT 127*  --    Basic Metabolic Panel:  Recent Labs  Lab 09/16/22 1216 09/16/22 1225  NA 138 140  K 3.8 3.9  CL 103 103  CO2 26  --   GLUCOSE 84 79  BUN 20 17  CREATININE 1.15 0.90  CALCIUM 8.4*  --    Lipid Panel:  Recent Labs  Lab 09/17/22 0258  CHOL 130  TRIG 49  HDL 59  CHOLHDL 2.2  VLDL 10  LDLCALC 61   HgbA1c:  Recent Labs  Lab 09/16/22 1226  HGBA1C 5.2   Urine Drug Screen:  Recent Labs  Lab 09/16/22 1217  LABOPIA NONE DETECTED  COCAINSCRNUR NONE DETECTED  LABBENZ NONE DETECTED  AMPHETMU NONE DETECTED  THCU NONE DETECTED  LABBARB NONE DETECTED    Alcohol Level  Recent Labs  Lab 09/16/22 1215  ETH <10    IMAGING past 24 hours ECHOCARDIOGRAM COMPLETE BUBBLE STUDY  Result Date: 09/17/2022    ECHOCARDIOGRAM REPORT   Patient Name:   Angel Keller. Date of Exam: 09/17/2022 Medical Rec #:  [id number]         Height:       70.0 in Accession #:    [id number]        Weight:       123.5 lb Date of Birth:  1941-02-16         BSA:          1.700 m Patient Age:    82 years          BP:           176/99 mmHg Patient Gender: M                  HR:           74 bpm. Exam Location:  Inpatient Procedure: 2D Echo, Color Doppler, Cardiac Doppler and Saline Contrast Bubble            Study Indications:    Stroke  History:        Patient has prior history of Echocardiogram examinations, most                 recent 08/28/2021. Risk Factors:Sleep Apnea, Hypertension and                 Dyslipidemia. Thoracic AAA, Pancreatic cancer.  Sonographer:    Eartha Inch Referring Phys: Baylis  Sonographer Comments: Suboptimal parasternal window. Image acquisition  challenging due to patient body habitus and Image acquisition challenging due to respiratory motion. IMPRESSIONS  1. Left ventricular ejection fraction, by estimation, is 60 to 65%. The left ventricle has normal function. The left ventricle has no regional wall motion abnormalities. Left ventricular diastolic parameters are consistent with Grade I diastolic dysfunction (impaired relaxation).  2. Right ventricular systolic function is normal. The right ventricular size is normal. There is normal pulmonary artery systolic pressure. The estimated right ventricular systolic pressure is 01.7 mmHg.  3. The mitral valve is abnormal. Trivial mitral valve regurgitation.  4. The aortic valve is tricuspid. Aortic valve regurgitation is not visualized.  5. The inferior vena cava is normal in size with greater than 50% respiratory variability, suggesting right atrial pressure of 3 mmHg.  6. Agitated saline contrast bubble study was negative, with no evidence of any interatrial shunt. Comparison(s): Changes from prior study are noted. 08/28/2021: LVEF 60-65%, grade 1 DD, mild biatrial enlargment. FINDINGS  Left Ventricle: Left ventricular ejection fraction, by estimation, is 60 to 65%. The left ventricle has normal function. The left ventricle has no regional wall motion abnormalities. The left ventricular internal cavity size was normal in size. There is  no left ventricular hypertrophy. Left  ventricular diastolic parameters are consistent with Grade I diastolic dysfunction (impaired relaxation). Indeterminate filling pressures. Right Ventricle: The right ventricular size is normal. No increase in right ventricular wall thickness. Right ventricular systolic function is normal. There is normal pulmonary artery systolic pressure. The tricuspid regurgitant velocity is 2.52 m/s, and  with an assumed right atrial pressure of 3 mmHg, the estimated right ventricular systolic pressure is 51.0 mmHg. Left Atrium: Left atrial size was normal in size. Right Atrium: Right atrial size was normal in size. Pericardium: There is no evidence of pericardial effusion. Mitral Valve: The mitral valve is abnormal. There is moderate thickening of the anterior and posterior mitral valve leaflet(s). Trivial mitral valve regurgitation. Tricuspid Valve: The tricuspid valve is grossly normal. Tricuspid valve regurgitation is trivial. Aortic Valve: The aortic valve is tricuspid. Aortic valve regurgitation is not visualized. Pulmonic Valve: The pulmonic valve was normal in structure. Pulmonic valve regurgitation is not visualized. Aorta: The aortic root and ascending aorta are structurally normal, with no evidence of dilitation and the ascending aorta was not well visualized. Venous: The inferior vena cava is normal in size with greater than 50% respiratory variability, suggesting right atrial pressure of 3 mmHg. IAS/Shunts: The interatrial septum is aneurysmal. No atrial level shunt detected by color flow Doppler. Agitated saline contrast was given intravenously to evaluate for intracardiac shunting. Agitated saline contrast bubble study was negative, with no evidence of any interatrial shunt.  LEFT VENTRICLE PLAX 2D LVIDd:         4.90 cm      Diastology LVIDs:         3.50 cm      LV e' medial:    6.20 cm/s LV PW:         0.90 cm      LV E/e' medial:  7.3 LV IVS:        0.90 cm      LV e' lateral:   6.09 cm/s LVOT diam:     2.00 cm       LV E/e' lateral: 7.4 LV SV:         66 LV SV Index:   39 LVOT Area:     3.14 cm  LV Volumes (MOD) LV vol d, MOD A2C:  102.0 ml LV vol d, MOD A4C: 104.0 ml LV vol s, MOD A2C: 31.4 ml LV vol s, MOD A4C: 34.8 ml LV SV MOD A2C:     70.6 ml LV SV MOD A4C:     104.0 ml LV SV MOD BP:      70.6 ml RIGHT VENTRICLE RV S prime:     14.40 cm/s TAPSE (M-mode): 2.6 cm LEFT ATRIUM             Index        RIGHT ATRIUM          Index LA diam:        4.00 cm 2.35 cm/m   RA Area:     9.66 cm LA Vol (A2C):   56.6 ml 33.29 ml/m  RA Volume:   18.00 ml 10.59 ml/m LA Vol (A4C):   36.5 ml 21.47 ml/m LA Biplane Vol: 45.6 ml 26.82 ml/m  AORTIC VALVE LVOT Vmax:   130.00 cm/s LVOT Vmean:  76.800 cm/s LVOT VTI:    0.210 m  AORTA Ao Root diam: 3.10 cm MITRAL VALVE               TRICUSPID VALVE MV Area (PHT): 1.40 cm    TR Peak grad:   25.4 mmHg MV Decel Time: 541 msec    TR Mean grad:   20.0 mmHg MV E velocity: 45.10 cm/s  TR Vmax:        252.00 cm/s MV A velocity: 85.00 cm/s  TR Vmean:       216.0 cm/s MV E/A ratio:  0.53                            SHUNTS                            Systemic VTI:  0.21 m                            Systemic Diam: 2.00 cm Lyman Bishop MD Electronically signed by Lyman Bishop MD Signature Date/Time: 09/17/2022/1:05:18 PM    Final    MR BRAIN WO CONTRAST  Result Date: 09/16/2022 CLINICAL DATA:  Acute neurologic deficit. Left-sided weakness and fall. EXAM: MRI HEAD WITHOUT CONTRAST MRA HEAD WITHOUT CONTRAST MRA NECK WITHOUT AND WITH CONTRAST TECHNIQUE: Multiplanar, multi-echo pulse sequences of the brain and surrounding structures were acquired without intravenous contrast. Angiographic images of the Circle of Willis were acquired using MRA technique without intravenous contrast. Angiographic images of the neck were acquired using MRA technique without and with intravenous contrast. Carotid stenosis measurements (when applicable) are obtained utilizing NASCET criteria, using the distal internal carotid  diameter as the denominator. 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 No acute hemorrhage. 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 No chronic microhemorrhage or siderosis. There is multifocal hyperintense T2-weighted signal within the white matter. Parenchymal volume and CSF spaces are normal. The midline structures are normal. Vascular: Normal flow voids. Skull and upper cervical spine: Normal marrow signal. Sinuses/Orbits: No acute or significant finding. Other: None. MRA HEAD FINDINGS POSTERIOR CIRCULATION: --Vertebral arteries: Normal --Inferior cerebellar arteries: Normal. --Basilar artery: Normal. --Superior cerebellar arteries: Normal. --Posterior cerebral arteries: Normal. ANTERIOR CIRCULATION: --Intracranial internal carotid arteries: Normal. --Anterior cerebral arteries (ACA): Normal. --Middle cerebral  arteries (MCA): Normal. Anatomic variants: None MRA NECK FINDINGS Aortic arch: Normal 3 vessel branching pattern Right carotid system: Normal Left carotid system: Normal Vertebral arteries: Codominant. Moderate narrowing of the midportion of the left V2 segment but otherwise normal. Other: None IMPRESSION: 1. 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 No acute hemorrhage or mass effect. 2. 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 3. No emergent large vessel occlusion or high-grade stenosis. 4. Moderate narrowing of the midportion of the left vertebral artery V2 segment. Electronically Signed   By: Ulyses Jarred M.D.   On: 09/16/2022 21:09   MR ANGIO HEAD WO CONTRAST  Result Date: 09/16/2022 CLINICAL DATA:  Acute neurologic deficit. Left-sided weakness and fall. EXAM: MRI HEAD WITHOUT CONTRAST MRA HEAD WITHOUT CONTRAST MRA NECK WITHOUT AND WITH CONTRAST TECHNIQUE: Multiplanar, multi-echo pulse sequences of the brain and surrounding structures were acquired without intravenous contrast.  Angiographic images of the Circle of Willis were acquired using MRA technique without intravenous contrast. Angiographic images of the neck were acquired using MRA technique without and with intravenous contrast. Carotid stenosis measurements (when applicable) are obtained utilizing NASCET criteria, using the distal internal carotid diameter as the denominator. CONTRAST:  5.76m GADAVIST GADOBUTROL 1 MMOL/ML IV SOLN COMPARISON:  11/14/2019 FINDINGS: MRI HEAD FINDINGS Brain: Acute/early subacute infarct of the right caudate tail. No acute hemorrhage. 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 No chronic microhemorrhage or siderosis. There is multifocal hyperintense T2-weighted signal within the white matter. Parenchymal volume and CSF spaces are normal. The midline structures are normal. Vascular: Normal flow voids. Skull and upper cervical spine: Normal marrow signal. Sinuses/Orbits: No acute or significant finding. Other: None. MRA HEAD FINDINGS POSTERIOR CIRCULATION: --Vertebral arteries: Normal --Inferior cerebellar arteries: Normal. --Basilar artery: Normal. --Superior cerebellar arteries: Normal. --Posterior cerebral arteries: Normal. ANTERIOR CIRCULATION: --Intracranial internal carotid arteries: Normal. --Anterior cerebral arteries (ACA): Normal. --Middle cerebral arteries (MCA): Normal. Anatomic variants: None MRA NECK FINDINGS Aortic arch: Normal 3 vessel branching pattern Right carotid system: Normal Left carotid system: Normal Vertebral arteries: Codominant. Moderate narrowing of the midportion of the left V2 segment but otherwise normal. Other: None IMPRESSION: 1. 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 No acute hemorrhage or mass effect. 2. 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 3. No emergent large vessel occlusion or high-grade stenosis. 4. Moderate narrowing of the midportion of the left vertebral artery V2 segment. Electronically Signed   By: KUlyses JarredM.D.   On: 09/16/2022 21:09   MR ANGIO NECK W WO  CONTRAST  Result Date: 09/16/2022 CLINICAL DATA:  Acute neurologic deficit. Left-sided weakness and fall. EXAM: MRI HEAD WITHOUT CONTRAST MRA HEAD WITHOUT CONTRAST MRA NECK WITHOUT AND WITH CONTRAST TECHNIQUE: Multiplanar, multi-echo pulse sequences of the brain and surrounding structures were acquired without intravenous contrast. Angiographic images of the Circle of Willis were acquired using MRA technique without intravenous contrast. Angiographic images of the neck were acquired using MRA technique without and with intravenous contrast. Carotid stenosis measurements (when applicable) are obtained utilizing NASCET criteria, using the distal internal carotid diameter as the denominator. 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 No acute hemorrhage. 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 No chronic microhemorrhage or siderosis. There is multifocal hyperintense T2-weighted signal within the white matter. Parenchymal volume and CSF spaces are normal. The midline structures are normal. Vascular: Normal flow voids. Skull and upper cervical spine: Normal marrow signal. Sinuses/Orbits: No acute or significant finding. Other: None. MRA HEAD  FINDINGS POSTERIOR CIRCULATION: --Vertebral arteries: Normal --Inferior cerebellar arteries: Normal. --Basilar artery: Normal. --Superior cerebellar arteries: Normal. --Posterior cerebral arteries: Normal. ANTERIOR CIRCULATION: --Intracranial internal carotid arteries: Normal. --Anterior cerebral arteries (ACA): Normal. --Middle cerebral arteries (MCA): Normal. Anatomic variants: None MRA NECK FINDINGS Aortic arch: Normal 3 vessel branching pattern Right carotid system: Normal Left carotid system: Normal Vertebral arteries: Codominant. Moderate narrowing of the midportion of the left V2 segment but otherwise normal. Other: None IMPRESSION: 1. Acute/early subacute  infarct of the right caudate tail. No acute hemorrhage or mass effect. 2. 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 3. No emergent large vessel occlusion or high-grade stenosis. 4. Moderate narrowing of the midportion of the left vertebral artery V2 segment. Electronically Signed   By: Ulyses Jarred M.D.   On: 09/16/2022 21:09    PHYSICAL EXAM Physical Exam Gen: A&O x4, NAD Resp: normal WOB CV: extremities appear well-perfused   Neuro: *MS: A&O x4. Follows multi-step commands.  *Speech: nondysarthric, no aphasia, able to name and repeat *CN: PERRL 100m, EOMI, VFF by confrontation, smile symmetric, hearing intact to voice *Motor: Normal bulk.  No tremor, rigidity or bradykinesia. LUE minimal drift (patient states this is improved since yesterday) LLE weakens with use. Patient working with PT and using walker during assessment. After walking in room for a couple of minutes, LLE was dragging and patient could barely lift it off the floor.  *Sensory: SILT. Symmetric. No double-simultaneous extinction.  *Coordination:  Finger-to-nose, heel-to-shin, rapid alternating motions were intact.  *Gait: weakens with time. Walker and assistance needed.   ASSESSMENT/PLAN Mr. Angel Keller is a 82y.o. 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  Last known well was 9:30 AM when his left leg gave out.  His contents had significantly improved.  He currently only has facial asymmetry and mild drift in his left upper extremity.  NIH stroke scale was 2.  Personal review of head CT shows no acute intracranial process. TNK not administered 2/2 mild and rapidly improving sx at time of exam. CTA was not performed because exam was not consistent with LVO.  Patient was observed in the ED until he was outside of the  TNK window at 1400 with no worsening of symptoms.  [facility]   Acute Ischemic Stroke Etiology:  ?Cancer-related  Code Stroke CT head:  No evidence of acute intracranial abnormality. ASPECTS is 10. 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 No acute hemorrhage or mass effect. 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 No emergent large vessel occlusion or high-grade stenosis. Moderate narrowing of the midportion of the left vertebral artery V2 segment.  2D Echo: LVEF 662-95% Grade 1 diastolic dysfunction Trivial MVR Negative bubble study  LDL 61 HgbA1c 5.2 VTE prophylaxis - scds    Diet   Diet regular Room service appropriate? Yes; Fluid consistency: Thin   No antithrombotic prior to admission, now on aspirin 81 mg daily. Recommend continuing on Aspirin '81mg'$  post-discharge.  Therapy recommendations:  home health PT Disposition:  pending  Hypertension Home meds:  none Permissive hypertension (OK if < 220/120) but gradually normalize in 5-7 days Long-term BP goal normotensive  Hyperlipidemia Home meds:  lovastatin '20mg'$  LDL 61, goal < 70 Changed to Lipitor '40mg'$   Continue statin at  discharge  Other Stroke Risk Factors Advanced Age >/= 23  Former Cigarette smoker Migraines  Other Active Problems, per Primary Team Hypothyroidism Synthroid 138mg at home History of Pancreatic Cancer Diagnosed 07/2021 S/p Whipple and chemo, Radiation currently.  Thrombocytopenia, Chronic Stable. Will avoid DAPT in this presentation.   Hospital day # 0  Stroke team will sign off with recommendations as above. Please recall with any further questions/concerns.  Thank you for this consult.  [facility]    Pt seen by Neuro NP/APP and later by MD. Note/plan to be edited by MD as needed.    EOtelia Santee DNP,  AGACNP-BC [facility] Please use AMION for pager and EPIC for messaging  ATTENDING ATTESTATION:  82year old with right MCA aneurysm treated with a stent history of pancreatic cancer and radiation therapy history of thoracic ascending aortic aneurysm, thrombocytopenia no TNK was given.  History of low platelets and blood transfusions in the past.  Due to this aspirin low-dose is recommended for stroke prevention.  This was discussed with the patient's daughters later in the afternoon.  They understand he is at high risk for recurrent stroke and only on aspirin due to risk of bleeding.  Discussed with him at length about signs and symptoms of stroke and when to bring him to the ER.  He can follow-up in stroke clinic [facility]  Neurology to sign off please call with questions.  Dr. PReeves Forthevaluated pt independently, reviewed imaging, chart, labs. Discussed and formulated plan with the Resident/APP. Changes were made to the note where appropriate. Please see APP/resident note above for details.    Sae Handrich,MD    To contact Stroke Continuity provider, please refer to [email or web address] After hours, contact General Neurology

## 2022-09-17 NOTE — Progress Notes (Deleted)
  Echocardiogram 2D Echocardiogram attempted at 0905. Pt is eating. Will attempt again later.  Angel Keller 09/17/2022, 9:05 AM

## 2022-09-17 NOTE — TOC Transition Note (Addendum)
Transition of Care [facility]) - CM/SW Discharge Note   Patient Details  Name: Angel BOHANON Sr. MRN: [id number] Date of Birth: 08-15-1941  Transition of Care [facility]) CM/SW Contact:  Carles Collet, RN Phone Number: 09/17/2022, 3:28 PM   Clinical Narrative:     Plan for DC to home today. Patient active with [facility] previously and daughter would like to use them again notified liaison that he will return home today w order for PT. Patient has RW at home. Daughter would like 3/1 delivered to the house, [facility]- will use adapt.  She will provide transportation home.   Update- [facility] unable to start OT for 3 weeks, spoke w daughter Angel Keller again, would like referral to [facility] accepted.    Final next level of care: [place] Barriers to Discharge: No Barriers Identified   Patient Goals and CMS Choice      Discharge Placement                         Discharge Plan and Services Additional resources added to the After Visit Summary for                  DME Arranged: N/A         HH Arranged: PT, OT HH Agency: [place] Date [place]: 09/17/22 Time [place]: 1528 Representative spoke with at [place]: [place] ([place]) Interventions [place]: No Food Insecurity (09/16/2022)  Housing: Low Risk  (09/16/2022)  Transportation Needs: No Transportation Needs (09/16/2022)  Utilities: Not At Risk (09/16/2022)  Depression (PHQ2-9): Medium Risk (02/03/2022)  Financial Resource Strain: High Risk (03/10/2022)  Tobacco Use: Medium Risk (09/16/2022)     Readmission Risk Interventions    11/21/2021    1:06 PM  Readmission Risk Prevention Plan  Transportation Screening Complete  Medication Review ([place]) Complete  PCP or Specialist appointment within 3-5 days of discharge Complete  HRI or [place] Complete  [place] Not Applicable

## 2022-09-17 NOTE — Plan of Care (Signed)
Pt discharged to home. PIV removed. RCW port deaccessed. All personal belongings returned to the patient. Left the unit in stable condition with wife and daughter.    Problem: Education: Goal: Knowledge of disease or condition will improve Outcome: Completed/Met Goal: Knowledge of secondary prevention will improve (MUST DOCUMENT ALL) Outcome: Completed/Met Goal: Knowledge of patient specific risk factors will improve [place] N/A or DELETE if not current risk factor) Outcome: Completed/Met   Problem: Ischemic Stroke/TIA Tissue Perfusion: Goal: Complications of ischemic stroke/TIA will be minimized Outcome: Completed/Met   Problem: Coping: Goal: Will verbalize positive feelings about self Outcome: Completed/Met Goal: Will identify appropriate support needs Outcome: Completed/Met   Problem: Health Behavior/Discharge Planning: Goal: Ability to manage health-related needs will improve Outcome: Completed/Met Goal: Goals will be collaboratively established with patient/family Outcome: Completed/Met   Problem: Self-Care: Goal: Ability to participate in self-care as condition permits will improve Outcome: Completed/Met Goal: Verbalization of feelings and concerns over difficulty with self-care will improve Outcome: Completed/Met Goal: Ability to communicate needs accurately will improve Outcome: Completed/Met   Problem: Nutrition: Goal: Risk of aspiration will decrease Outcome: Completed/Met Goal: Dietary intake will improve Outcome: Completed/Met   Problem: Education: Goal: Knowledge of General Education information will improve Description: Including pain rating scale, medication(s)/side effects and non-pharmacologic comfort measures Outcome: Completed/Met   Problem: Health Behavior/Discharge Planning: Goal: Ability to manage health-related needs will improve Outcome: Completed/Met   Problem: Clinical Measurements: Goal: Ability to maintain clinical measurements within  normal limits will improve Outcome: Completed/Met Goal: Will remain free from infection Outcome: Completed/Met Goal: Diagnostic test results will improve Outcome: Completed/Met Goal: Respiratory complications will improve Outcome: Completed/Met Goal: Cardiovascular complication will be avoided Outcome: Completed/Met   Problem: Activity: Goal: Risk for activity intolerance will decrease Outcome: Completed/Met   Problem: Nutrition: Goal: Adequate nutrition will be maintained Outcome: Completed/Met   Problem: Coping: Goal: Level of anxiety will decrease Outcome: Completed/Met   Problem: Elimination: Goal: Will not experience complications related to bowel motility Outcome: Completed/Met Goal: Will not experience complications related to urinary retention Outcome: Completed/Met   Problem: Pain Managment: Goal: General experience of comfort will improve Outcome: Completed/Met   Problem: Safety: Goal: Ability to remain free from injury will improve Outcome: Completed/Met   Problem: Skin Integrity: Goal: Risk for impaired skin integrity will decrease Outcome: Completed/Met

## 2022-09-17 NOTE — Progress Notes (Signed)
[facility] PROGRESS NOTE   Angel Keller GUY:[id number] DOB: September 25, 1940 DOA: 09/16/2022  PCP: Ronnell Freshwater, NP  Brief History/Interval Summary:  82 y.o. male with medical history significant for HTN, HLD, hypothyroidism, pancreatic CA, and TAA presenting with left-sided weakness resulting in a fall.  He is receiving radiation treatment for pancreatic cancer.  Evaluation revealed acute stroke.  He was hospitalized for further management.   Consultants: Neurology  Procedures: Echocardiogram is pending    Subjective/Interval History: Patient mentions that the left-sided weakness has improved.  Denies any other complaints at this time.    Assessment/Plan:  Acute stroke Patient presented with left-sided weakness.  Seems to have improved this morning. MRI confirms stroke. Echocardiogram is pending. LDL is 61.  Patient noted to be on statin. HbA1c 5.2. PT evaluation has been done and home health is recommended. Patient currently on aspirin. Neurology has been consulted. OT and SLP evaluation is still pending.  History of pancreatic cancer Diagnosed in December 2022.  Underwent Whipple following neoadjuvant chemotherapy.  Subsequently was treated with adjuvant chemotherapy. Recent imaging with 1.3 cm tumor anterior to the aorta near the surgical cavity but not amenable to biopsy. Patient currently getting radiation treatment. Continue Creon.  Essential hypertension Allowing permissive hypertension Holding ARB  Hyperlipidemia Lovastatin was changed over to atorvastatin.  LDL is 61.  Myelodysplastic syndrome/thrombocytopenia normocytic anemia Has chronic thrombocytopenia which is stable. He was given Nplate previously.  Currently on Promacta. Followed by Dr. Burr Medico. Hemoglobin is low but stable.  No evidence for overt bleeding.  Mood disorder Continue psychotropic medications.  Hypothyroidism Continue levothyroxine  DVT Prophylaxis: SCDs Code Status:  Full code Family Communication: Discussed with patient.  No family at bedside Disposition Plan: Home with home health when medically cleared for discharge.  Status is: Observation The patient remains OBS appropriate and may d/c before 2 midnights.      Medications: Scheduled:  aspirin EC  81 mg Oral Daily   atorvastatin  40 mg Oral QHS   eltrombopag  50 mg Oral Daily   escitalopram  20 mg Oral Daily   influenza vaccine adjuvanted  0.5 mL Intramuscular Tomorrow-1000   levothyroxine  112 mcg Oral QAC breakfast   lipase/protease/amylase  72,000 Units Oral TID AC   mirtazapine  7.5 mg Oral QHS   pantoprazole  40 mg Oral Daily   pneumococcal 23 valent vaccine  0.5 mL Intramuscular Tomorrow-1000   Continuous: DGL:OVFIEPPIRJJOA **OR** acetaminophen (TYLENOL) oral liquid 160 mg/5 mL **OR** acetaminophen, hydrALAZINE, midazolam, senna-docusate  Antibiotics: Anti-infectives (From admission, onward)    None       Objective:  Vital Signs  Vitals:   09/16/22 2144 09/17/22 0000 09/17/22 0334 09/17/22 0756  BP: (!) 198/99 (!) 222/107 139/76 (!) 176/99  Pulse: (!) 59 76 78 74  Resp: '18 16 17 18  '$ Temp: 98.1 F (36.7 C) 97.8 F (36.6 C) 97.8 F (36.6 C) 97.6 F (36.4 C)  TempSrc: Oral Oral Oral Oral  SpO2: 98% 99% 100% 96%  Weight:      Height:        Intake/Output Summary (Last 24 hours) at 09/17/2022 1108 Last data filed at 09/17/2022 0100 Gross per 24 hour  Intake 240 ml  Output 201 ml  Net 39 ml   Filed Weights   09/16/22 1108  Weight: 56 kg    General appearance: Awake alert.  In no distress Resp: Clear to auscultation bilaterally.  Normal effort Cardio: S1-S2 is normal regular.  No S3-S4.  No rubs murmurs or bruit GI: Abdomen is soft.  Nontender nondistended.  Bowel sounds are present normal.  No masses organomegaly Extremities: No edema.  Full range of motion of lower extremities. Neurologic: Alert and oriented x3.  Very subtle left-sided weakness  noted.   Lab Results:  Data Reviewed: I have personally reviewed following labs and reports of the imaging studies  CBC: Recent Labs  Lab 09/16/22 1216 09/16/22 1225  WBC 3.4*  --   NEUTROABS 2.3  --   HGB 9.8* 10.5*  HCT 30.8* 31.0*  MCV 88.3  --   PLT 127*  --     Basic Metabolic Panel: Recent Labs  Lab 09/16/22 1216 09/16/22 1225  NA 138 140  K 3.8 3.9  CL 103 103  CO2 26  --   GLUCOSE 84 79  BUN 20 17  CREATININE 1.15 0.90  CALCIUM 8.4*  --     GFR: Estimated Creatinine Clearance: 50.1 mL/min (by C-G formula based on SCr of 0.9 mg/dL).  Liver Function Tests: Recent Labs  Lab 09/16/22 1216  AST 28  ALT 16  ALKPHOS 65  BILITOT 0.4  PROT 6.3*  ALBUMIN 3.1*     Coagulation Profile: Recent Labs  Lab 09/16/22 1216  INR 1.3*     HbA1C: Recent Labs    09/16/22 1226  HGBA1C 5.2     Lipid Profile: Recent Labs    09/17/22 0258  CHOL 130  HDL 59  LDLCALC 61  TRIG 49  CHOLHDL 2.2     Radiology Studies: MR BRAIN WO CONTRAST  Result Date: 09/16/2022 CLINICAL DATA:  Acute neurologic deficit. Left-sided weakness and fall. EXAM: MRI HEAD WITHOUT CONTRAST MRA HEAD WITHOUT CONTRAST MRA NECK WITHOUT AND WITH CONTRAST TECHNIQUE: Multiplanar, multi-echo pulse sequences of the brain and surrounding structures were acquired without intravenous contrast. Angiographic images of the Circle of Willis were acquired using MRA technique without intravenous contrast. Angiographic images of the neck were acquired using MRA technique without and with intravenous contrast. Carotid stenosis measurements (when applicable) are obtained utilizing NASCET criteria, using the distal internal carotid diameter as the denominator. CONTRAST:  5.7m GADAVIST GADOBUTROL 1 MMOL/ML IV SOLN COMPARISON:  11/14/2019 FINDINGS: MRI HEAD FINDINGS Brain: Acute/early subacute infarct of the right caudate tail. No acute hemorrhage. 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 No chronic microhemorrhage or  siderosis. There is multifocal hyperintense T2-weighted signal within the white matter. Parenchymal volume and CSF spaces are normal. The midline structures are normal. Vascular: Normal flow voids. Skull and upper cervical spine: Normal marrow signal. Sinuses/Orbits: No acute or significant finding. Other: None. MRA HEAD FINDINGS POSTERIOR CIRCULATION: --Vertebral arteries: Normal --Inferior cerebellar arteries: Normal. --Basilar artery: Normal. --Superior cerebellar arteries: Normal. --Posterior cerebral arteries: Normal. ANTERIOR CIRCULATION: --Intracranial internal carotid arteries: Normal. --Anterior cerebral arteries (ACA): Normal. --Middle cerebral arteries (MCA): Normal. Anatomic variants: None MRA NECK FINDINGS Aortic arch: Normal 3 vessel branching pattern Right carotid system: Normal Left carotid system: Normal Vertebral arteries: Codominant. Moderate narrowing of the midportion of the left V2 segment but otherwise normal. Other: None IMPRESSION: 1. 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 No acute hemorrhage or mass effect. 2. 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 3. No emergent large vessel occlusion or high-grade stenosis. 4. Moderate narrowing of the midportion of the left vertebral artery V2 segment. Electronically Signed   By: KUlyses JarredM.D.   On: 09/16/2022 21:09   MR ANGIO HEAD WO CONTRAST  Result Date: 09/16/2022 CLINICAL DATA:  Acute neurologic deficit. Left-sided weakness and  fall. EXAM: MRI HEAD WITHOUT CONTRAST MRA HEAD WITHOUT CONTRAST MRA NECK WITHOUT AND WITH CONTRAST TECHNIQUE: Multiplanar, multi-echo pulse sequences of the brain and surrounding structures were acquired without intravenous contrast. Angiographic images of the Circle of Willis were acquired using MRA technique without intravenous contrast. Angiographic images of the neck were acquired using MRA technique without and with intravenous contrast. Carotid stenosis measurements (when applicable) are obtained  utilizing NASCET criteria, using the distal internal carotid diameter as the denominator. CONTRAST:  5.62m GADAVIST GADOBUTROL 1 MMOL/ML IV SOLN COMPARISON:  11/14/2019 FINDINGS: MRI HEAD FINDINGS Brain: Acute/early subacute infarct of the right caudate tail. No acute hemorrhage. 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 No chronic microhemorrhage or siderosis. There is multifocal hyperintense T2-weighted signal within the white matter. Parenchymal volume and CSF spaces are normal. The midline structures are normal. Vascular: Normal flow voids. Skull and upper cervical spine: Normal marrow signal. Sinuses/Orbits: No acute or significant finding. Other: None. MRA HEAD FINDINGS POSTERIOR CIRCULATION: --Vertebral arteries: Normal --Inferior cerebellar arteries: Normal. --Basilar artery: Normal. --Superior cerebellar arteries: Normal. --Posterior cerebral arteries: Normal. ANTERIOR CIRCULATION: --Intracranial internal carotid arteries: Normal. --Anterior cerebral arteries (ACA): Normal. --Middle cerebral arteries (MCA): Normal. Anatomic variants: None MRA NECK FINDINGS Aortic arch: Normal 3 vessel branching pattern Right carotid system: Normal Left carotid system: Normal Vertebral arteries: Codominant. Moderate narrowing of the midportion of the left V2 segment but otherwise normal. Other: None IMPRESSION: 1. 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 No acute hemorrhage or mass effect. 2. 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 3. No emergent large vessel occlusion or high-grade stenosis. 4. Moderate narrowing of the midportion of the left vertebral artery V2 segment. Electronically Signed   By: KUlyses JarredM.D.   On: 09/16/2022 21:09   MR ANGIO NECK W WO CONTRAST  Result Date: 09/16/2022 CLINICAL DATA:  Acute neurologic deficit. Left-sided weakness and fall. EXAM: MRI HEAD WITHOUT CONTRAST MRA HEAD WITHOUT CONTRAST MRA NECK WITHOUT AND WITH CONTRAST TECHNIQUE: Multiplanar, multi-echo pulse sequences of the brain and  surrounding structures were acquired without intravenous contrast. Angiographic images of the Circle of Willis were acquired using MRA technique without intravenous contrast. Angiographic images of the neck were acquired using MRA technique without and with intravenous contrast. Carotid stenosis measurements (when applicable) are obtained utilizing NASCET criteria, using the distal internal carotid diameter as the denominator. CONTRAST:  5.647mGADAVIST GADOBUTROL 1 MMOL/ML IV SOLN COMPARISON:  11/14/2019 FINDINGS: MRI HEAD FINDINGS Brain: Acute/early subacute infarct of the right caudate tail. No acute hemorrhage. 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 No chronic microhemorrhage or siderosis. There is multifocal hyperintense T2-weighted signal within the white matter. Parenchymal volume and CSF spaces are normal. The midline structures are normal. Vascular: Normal flow voids. Skull and upper cervical spine: Normal marrow signal. Sinuses/Orbits: No acute or significant finding. Other: None. MRA HEAD FINDINGS POSTERIOR CIRCULATION: --Vertebral arteries: Normal --Inferior cerebellar arteries: Normal. --Basilar artery: Normal. --Superior cerebellar arteries: Normal. --Posterior cerebral arteries: Normal. ANTERIOR CIRCULATION: --Intracranial internal carotid arteries: Normal. --Anterior cerebral arteries (ACA): Normal. --Middle cerebral arteries (MCA): Normal. Anatomic variants: None MRA NECK FINDINGS Aortic arch: Normal 3 vessel branching pattern Right carotid system: Normal Left carotid system: Normal Vertebral arteries: Codominant. Moderate narrowing of the midportion of the left V2 segment but otherwise normal. Other: None IMPRESSION: 1. 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 No acute hemorrhage or mass effect. 2. 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 3. No emergent large vessel occlusion or high-grade stenosis. 4. Moderate narrowing of the midportion of the left vertebral artery V2  segment. Electronically Signed   By:  Ulyses Jarred M.D.   On: 09/16/2022 21:09   CT HEAD CODE STROKE WO CONTRAST  Result Date: 09/16/2022 CLINICAL DATA:  Code stroke. Neuro deficit, acute, stroke suspected at EXAM: CT HEAD WITHOUT CONTRAST TECHNIQUE: Contiguous axial images were obtained from the base of the skull through the vertex without intravenous contrast. RADIATION DOSE REDUCTION: This exam was performed according to the departmental dose-optimization program which includes automated exposure control, adjustment of the mA and/or kV according to patient size and/or use of iterative reconstruction technique. COMPARISON:  CT head February 10, 2020. FINDINGS: Mildly motion limited study. Brain: No evidence of acute large vascular territory infarct or acute hemorrhage. Remote right occipital infarct. No evidence of mass lesion, midline shift or hydrocephalus. Patchy white matter hypodensities, nonspecific but compatible with chronic microvascular ischemic disease. Streak artifact through the posterior fossa and right greater than left temporal lobes. Vascular: Right MCA stent.  No visible hyperdense vessel. Skull: No acute fracture. Sinuses/Orbits: Inferior left maxillary sinus mucosal thickening. No acute orbital findings. ASPECTS [facility] Stroke Program Early CT Score) total score (0-10 with 10 being normal): 10. IMPRESSION: 1. No evidence of acute intracranial abnormality. ASPECTS is 10. 2. 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 3. Right MCA stent. Findings discussed with Dr. Quinn Axe via telephone at 11:56 AM. Electronically Signed   By: Margaretha Sheffield M.D.   On: 09/16/2022 11:57       LOS: 0 days   [facility] Pager on [email or web address]  09/17/2022, 11:08 AM

## 2022-09-17 NOTE — Evaluation (Signed)
Physical Therapy Evaluation Patient Details Name: Angel DIMICHELE Sr. MRN: [id number] DOB: [birth year] Today's Date: 09/17/2022  History of Present Illness  Pt is an 82 y.o. male presenting to [facility] ED 2/2 with LLE weakness after falling at home. Transferred to [facility] for further stroke work up. 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 NIHSS=2 PMH significant for right MCA aneurysm treated with stent, pancreatic cancer on radiation, thoracic ascending aortic aneurysm, thyroid disease, depression, hyperlipidemia, and hypertension.  Clinical Impression   Pt admitted secondary to problem above with deficits below. PTA patient was independent with mobility and basic ADLs. Lives with daughter in one level home with 3 steps to enter. Pt currently requires min to moderate assistance to ambulate with RW (increased assist as he fatigues). Discussed discharge plan and he wants to go home with HHPT. He will need 1-2 more sessions with PT before this will be a safe discharge plan.  Anticipate patient will benefit from PT to address problems listed below.Will continue to follow acutely to maximize functional mobility independence and safety.          Recommendations for follow up therapy are one component of a multi-disciplinary discharge planning process, led by the attending physician.  Recommendations may be updated based on patient status, additional functional criteria and insurance authorization.  Follow Up Recommendations Home health PT      Assistance Recommended at Discharge Frequent or constant Supervision/Assistance  Patient can return home with the following  A little help with walking and/or transfers;Assistance with cooking/housework;Assist for transportation;Help with stairs or ramp for entrance    Equipment Recommendations None recommended by PT  Recommendations for Other Services       Functional Status Assessment Patient has had a recent decline in their functional status  and demonstrates the ability to make significant improvements in function in a reasonable and predictable amount of time.     Precautions / Restrictions Precautions Precautions: Fall      Mobility  Bed Mobility Overal bed mobility: Modified Independent             General bed mobility comments: exit to left side of bed; incr effort and time    Transfers Overall transfer level: Needs assistance Equipment used: Rolling walker (2 wheels), None Transfers: Sit to/from Stand, Bed to chair/wheelchair/BSC Sit to Stand: Min assist   Step pivot transfers: Min assist       General transfer comment: from bed to RW with cues for technique and posterior imbalance; chair to/from BSC min assist for balance (no device)    Ambulation/Gait Ambulation/Gait assistance: Mod assist, Min assist Gait Distance (Feet): 120 Feet Assistive device: Rolling walker (2 wheels) Gait Pattern/deviations: Step-through pattern, Decreased dorsiflexion - left, Steppage   Gait velocity interpretation: 1.31 - 2.62 ft/sec, indicative of limited community ambulator   General Gait Details: initially min assist, but as he fatigued wiht incr dragging of LLE and LUE lost his grip on the RW progressing to mod assist for ambulation  Stairs            Wheelchair Mobility    Modified Rankin (Stroke Patients Only) Modified Rankin (Stroke Patients Only) Pre-Morbid Rankin Score: No symptoms Modified Rankin: Moderately severe disability     Balance Overall balance assessment: Needs assistance Sitting-balance support: No upper extremity supported, Feet supported Sitting balance-Leahy Scale: Fair     Standing balance support: Bilateral upper extremity supported, Reliant on assistive device for balance Standing balance-Leahy Scale: Poor  Pertinent Vitals/Pain Pain Assessment Pain Assessment: No/denies pain    Home Living Family/patient expects to be discharged  to:: Private residence Living Arrangements: Children Available Help at Discharge: Family;Available 24 hours/day (daughter) Type of Home: House Home Access: Stairs to enter Entrance Stairs-Rails: Chemical engineer of Steps: 3   Home Layout: One level Home Equipment: Conservation officer, nature (2 wheels)      Prior Function Prior Level of Function : Needs assist             Mobility Comments: independent with ambulation; works out 3 days/week at home with weights ADLs Comments: daughter drives him and does IADLs ; Ind with ADLs     Hand Dominance   Dominant Hand: Right    Extremity/Trunk Assessment   Upper Extremity Assessment Upper Extremity Assessment: Defer to OT evaluation    Lower Extremity Assessment Lower Extremity Assessment: LLE deficits/detail LLE Deficits / Details: knee extension 3+, ankle DF 4 LLE Sensation: decreased light touch    Cervical / Trunk Assessment Cervical / Trunk Assessment: Normal  Communication   Communication: HOH  Cognition Arousal/Alertness: Awake/alert Behavior During Therapy: WFL for tasks assessed/performed Overall Cognitive Status: Impaired/Different from baseline Area of Impairment: Safety/judgement, Awareness                         Safety/Judgement: Decreased awareness of deficits, Decreased awareness of safety Awareness: Intellectual   General Comments: pt poor judge of how tired he was becoming with gait and wanted to keep going despite LLE dragging more and LUE falling off RW; despite significant fatigue, thought he could walk to bathroom once seated in chair        General Comments      Exercises     Assessment/Plan    PT Assessment Patient needs continued PT services  PT Problem List Decreased strength;Decreased activity tolerance;Decreased balance;Decreased mobility;Decreased cognition;Decreased knowledge of use of DME;Decreased safety awareness;Impaired sensation       PT Treatment  Interventions DME instruction;Gait training;Stair training;Functional mobility training;Therapeutic activities;Balance training;Neuromuscular re-education;Cognitive remediation;Patient/family education    PT Goals (Current goals can be found in the Care Plan section)  Acute Rehab PT Goals Patient Stated Goal: to go home and do HHPT PT Goal Formulation: With patient Time For Goal Achievement: 10/01/22 Potential to Achieve Goals: Good    Frequency Min 4X/week     Co-evaluation               AM-PAC PT "6 Clicks" Mobility  Outcome Measure Help needed turning from your back to your side while in a flat bed without using bedrails?: None Help needed moving from lying on your back to sitting on the side of a flat bed without using bedrails?: A Little Help needed moving to and from a bed to a chair (including a wheelchair)?: A Little Help needed standing up from a chair using your arms (e.g., wheelchair or bedside chair)?: A Little Help needed to walk in hospital room?: A Little Help needed climbing 3-5 steps with a railing? : A Lot 6 Click Score: 18    End of Session Equipment Utilized During Treatment: Gait belt Activity Tolerance: Patient limited by fatigue Patient left: in chair;with call bell/phone within reach;with chair alarm set Nurse Communication: Mobility status PT Visit Diagnosis: Other abnormalities of gait and mobility (R26.89);Hemiplegia and hemiparesis Hemiplegia - Right/Left: Left Hemiplegia - dominant/non-dominant: Non-dominant Hemiplegia - caused by: Cerebral infarction    Time: 4496-7591 PT Time Calculation (min) (ACUTE ONLY): 35 min  Charges:   PT Evaluation $PT Eval Moderate Complexity: 1 Mod PT Treatments $Gait Training: 23-37 mins         Arby Barrette, PT [place]  Office [phone]   Rexanne Mano 09/17/2022, 11:01 AM

## 2022-09-18 NOTE — Discharge Summary (Signed)
[facility]  Physician Discharge Summary   Patient ID: Angel SCHWAGER Sr. MRN: [id number] DOB/AGE: 04/01/1941 82 y.o.  Admit date: 09/16/2022 Discharge date: 09/17/2022    PCP: Ronnell Freshwater, NP  DISCHARGE DIAGNOSES:  Acute CVA   Acquired hypothyroidism   HLD (hyperlipidemia)   Anxiety   Essential hypertension   Pancreatic cancer (HCC)   Thrombocytopenia (HCC)   MDS (myelodysplastic syndrome) ([place])   RECOMMENDATIONS FOR OUTPATIENT FOLLOW UP: Patient to follow up with his radiation oncologist for radiation treatments Ambulatory referral sent to neurology    Home Health: PT and OT Equipment/Devices: None  CODE STATUS: Full code  DISCHARGE CONDITION: fair  Diet recommendation: As before  INITIAL HISTORY: 82 y.o. male with medical history significant for HTN, HLD, hypothyroidism, pancreatic CA, and TAA presenting with left-sided weakness resulting in a fall.  He is receiving radiation treatment for pancreatic cancer.  Evaluation revealed acute stroke.  He was hospitalized for further management.    Consultants: Neurology   Procedures: Echocardiogram   HOSPITAL COURSE:   Acute stroke Patient presented with left-sided weakness.  Seems to have improved this morning. MRI confirms stroke. Echocardiogram shows normal systolic function. LDL is 61.  Patient noted to be on statin. HbA1c 5.2. PT evaluation has been done and home health is recommended. Patient seen by neurology.  Aspirin is recommended.  No dual antiplatelet agents due to his myelodysplastic syndrome and high risk of bleeding.     History of pancreatic cancer Diagnosed in December 2022.  Underwent [facility] following neoadjuvant chemotherapy.  Subsequently was treated with adjuvant chemotherapy. Recent imaging with 1.3 cm tumor anterior to the aorta near the surgical cavity but not amenable to biopsy. Patient currently getting radiation treatment.  Patient can follow-up with his radiation oncologist  to determine if radiation treatments can be continued. Continue Creon.   Essential hypertension Allowing permissive hypertension Holding ARB   Hyperlipidemia Lovastatin was changed over to atorvastatin.  LDL is 61.   Myelodysplastic syndrome/thrombocytopenia normocytic anemia Has chronic thrombocytopenia which is stable. He was given Nplate previously.  Currently on Promacta. Followed by Dr. Burr Medico. Hemoglobin is low but stable.  No evidence for overt bleeding.   Mood disorder Continue psychotropic medications.   Hypothyroidism Continue levothyroxine  Patient is stable.  Okay for discharge home today.  Discussed with his daughter.  PERTINENT LABS:  The results of significant diagnostics from this hospitalization (including imaging, microbiology, ancillary and laboratory) are listed below for reference.    Labs:   Basic Metabolic Panel: Recent Labs  Lab 09/16/22 1216 09/16/22 1225  NA 138 140  K 3.8 3.9  CL 103 103  CO2 26  --   GLUCOSE 84 79  BUN 20 17  CREATININE 1.15 0.90  CALCIUM 8.4*  --    Liver Function Tests: Recent Labs  Lab 09/16/22 1216  AST 28  ALT 16  ALKPHOS 65  BILITOT 0.4  PROT 6.3*  ALBUMIN 3.1*    CBC: Recent Labs  Lab 09/16/22 1216 09/16/22 1225  WBC 3.4*  --   NEUTROABS 2.3  --   HGB 9.8* 10.5*  HCT 30.8* 31.0*  MCV 88.3  --   PLT 127*  --       IMAGING STUDIES ECHOCARDIOGRAM COMPLETE BUBBLE STUDY  Result Date: 09/17/2022    ECHOCARDIOGRAM REPORT   Patient Name:   Angel IMPERATO Sr. Date of Exam: 09/17/2022 Medical Rec #:  [id number]         Height:  70.0 in Accession #:    [id number]        Weight:       123.5 lb Date of Birth:  1941-04-22         BSA:          1.700 m Patient Age:    82 years          BP:           176/99 mmHg Patient Gender: M                 HR:           74 bpm. Exam Location:  Inpatient Procedure: 2D Echo, Color Doppler, Cardiac Doppler and Saline Contrast Bubble            Study Indications:    Stroke   History:        Patient has prior history of Echocardiogram examinations, most                 recent 08/28/2021. Risk Factors:Sleep Apnea, Hypertension and                 Dyslipidemia. Thoracic AAA, Pancreatic cancer.  Sonographer:    Eartha Inch Referring Phys: Mona  Sonographer Comments: Suboptimal parasternal window. Image acquisition challenging due to patient body habitus and Image acquisition challenging due to respiratory motion. IMPRESSIONS  1. Left ventricular ejection fraction, by estimation, is 60 to 65%. The left ventricle has normal function. The left ventricle has no regional wall motion abnormalities. Left ventricular diastolic parameters are consistent with Grade I diastolic dysfunction (impaired relaxation).  2. Right ventricular systolic function is normal. The right ventricular size is normal. There is normal pulmonary artery systolic pressure. The estimated right ventricular systolic pressure is 92.4 mmHg.  3. The mitral valve is abnormal. Trivial mitral valve regurgitation.  4. The aortic valve is tricuspid. Aortic valve regurgitation is not visualized.  5. The inferior vena cava is normal in size with greater than 50% respiratory variability, suggesting right atrial pressure of 3 mmHg.  6. Agitated saline contrast bubble study was negative, with no evidence of any interatrial shunt. Comparison(s): Changes from prior study are noted. 08/28/2021: LVEF 60-65%, grade 1 DD, mild biatrial enlargment. FINDINGS  Left Ventricle: Left ventricular ejection fraction, by estimation, is 60 to 65%. The left ventricle has normal function. The left ventricle has no regional wall motion abnormalities. The left ventricular internal cavity size was normal in size. There is  no left ventricular hypertrophy. Left ventricular diastolic parameters are consistent with Grade I diastolic dysfunction (impaired relaxation). Indeterminate filling pressures. Right Ventricle: The right ventricular size is  normal. No increase in right ventricular wall thickness. Right ventricular systolic function is normal. There is normal pulmonary artery systolic pressure. The tricuspid regurgitant velocity is 2.52 m/s, and  with an assumed right atrial pressure of 3 mmHg, the estimated right ventricular systolic pressure is 26.8 mmHg. Left Atrium: Left atrial size was normal in size. Right Atrium: Right atrial size was normal in size. Pericardium: There is no evidence of pericardial effusion. Mitral Valve: The mitral valve is abnormal. There is moderate thickening of the anterior and posterior mitral valve leaflet(s). Trivial mitral valve regurgitation. Tricuspid Valve: The tricuspid valve is grossly normal. Tricuspid valve regurgitation is trivial. Aortic Valve: The aortic valve is tricuspid. Aortic valve regurgitation is not visualized. Pulmonic Valve: The pulmonic valve was normal in structure. Pulmonic valve regurgitation is not visualized. Aorta: The aortic root  and ascending aorta are structurally normal, with no evidence of dilitation and the ascending aorta was not well visualized. Venous: The inferior vena cava is normal in size with greater than 50% respiratory variability, suggesting right atrial pressure of 3 mmHg. IAS/Shunts: The interatrial septum is aneurysmal. No atrial level shunt detected by color flow Doppler. Agitated saline contrast was given intravenously to evaluate for intracardiac shunting. Agitated saline contrast bubble study was negative, with no evidence of any interatrial shunt.  LEFT VENTRICLE PLAX 2D LVIDd:         4.90 cm      Diastology LVIDs:         3.50 cm      LV e' medial:    6.20 cm/s LV PW:         0.90 cm      LV E/e' medial:  7.3 LV IVS:        0.90 cm      LV e' lateral:   6.09 cm/s LVOT diam:     2.00 cm      LV E/e' lateral: 7.4 LV SV:         66 LV SV Index:   39 LVOT Area:     3.14 cm  LV Volumes (MOD) LV vol d, MOD A2C: 102.0 ml LV vol d, MOD A4C: 104.0 ml LV vol s, MOD A2C: 31.4  ml LV vol s, MOD A4C: 34.8 ml LV SV MOD A2C:     70.6 ml LV SV MOD A4C:     104.0 ml LV SV MOD BP:      70.6 ml RIGHT VENTRICLE RV S prime:     14.40 cm/s TAPSE (M-mode): 2.6 cm LEFT ATRIUM             Index        RIGHT ATRIUM          Index LA diam:        4.00 cm 2.35 cm/m   RA Area:     9.66 cm LA Vol (A2C):   56.6 ml 33.29 ml/m  RA Volume:   18.00 ml 10.59 ml/m LA Vol (A4C):   36.5 ml 21.47 ml/m LA Biplane Vol: 45.6 ml 26.82 ml/m  AORTIC VALVE LVOT Vmax:   130.00 cm/s LVOT Vmean:  76.800 cm/s LVOT VTI:    0.210 m  AORTA Ao Root diam: 3.10 cm MITRAL VALVE               TRICUSPID VALVE MV Area (PHT): 1.40 cm    TR Peak grad:   25.4 mmHg MV Decel Time: 541 msec    TR Mean grad:   20.0 mmHg MV E velocity: 45.10 cm/s  TR Vmax:        252.00 cm/s MV A velocity: 85.00 cm/s  TR Vmean:       216.0 cm/s MV E/A ratio:  0.53                            SHUNTS                            Systemic VTI:  0.21 m                            Systemic Diam: 2.00 cm Lyman Bishop MD Electronically signed by Lyman Bishop MD Signature Date/Time:  09/17/2022/1:05:18 PM    Final    MR BRAIN WO CONTRAST  Result Date: 09/16/2022 CLINICAL DATA:  Acute neurologic deficit. Left-sided weakness and fall. EXAM: MRI HEAD WITHOUT CONTRAST MRA HEAD WITHOUT CONTRAST MRA NECK WITHOUT AND WITH CONTRAST TECHNIQUE: Multiplanar, multi-echo pulse sequences of the brain and surrounding structures were acquired without intravenous contrast. Angiographic images of the Circle of Willis were acquired using MRA technique without intravenous contrast. Angiographic images of the neck were acquired using MRA technique without and with intravenous contrast. Carotid stenosis measurements (when applicable) are obtained utilizing NASCET criteria, using the distal internal carotid diameter as the denominator. CONTRAST:  5.66m GADAVIST GADOBUTROL 1 MMOL/ML IV SOLN COMPARISON:  11/14/2019 FINDINGS: MRI HEAD FINDINGS Brain: Acute/early subacute infarct of the  right caudate tail. No acute hemorrhage. 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 No chronic microhemorrhage or siderosis. There is multifocal hyperintense T2-weighted signal within the white matter. Parenchymal volume and CSF spaces are normal. The midline structures are normal. Vascular: Normal flow voids. Skull and upper cervical spine: Normal marrow signal. Sinuses/Orbits: No acute or significant finding. Other: None. MRA HEAD FINDINGS POSTERIOR CIRCULATION: --Vertebral arteries: Normal --Inferior cerebellar arteries: Normal. --Basilar artery: Normal. --Superior cerebellar arteries: Normal. --Posterior cerebral arteries: Normal. ANTERIOR CIRCULATION: --Intracranial internal carotid arteries: Normal. --Anterior cerebral arteries (ACA): Normal. --Middle cerebral arteries (MCA): Normal. Anatomic variants: None MRA NECK FINDINGS Aortic arch: Normal 3 vessel branching pattern Right carotid system: Normal Left carotid system: Normal Vertebral arteries: Codominant. Moderate narrowing of the midportion of the left V2 segment but otherwise normal. Other: None IMPRESSION: 1. 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 No acute hemorrhage or mass effect. 2. 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 3. No emergent large vessel occlusion or high-grade stenosis. 4. Moderate narrowing of the midportion of the left vertebral artery V2 segment. Electronically Signed   By: KUlyses JarredM.D.   On: 09/16/2022 21:09   MR ANGIO HEAD WO CONTRAST  Result Date: 09/16/2022 CLINICAL DATA:  Acute neurologic deficit. Left-sided weakness and fall. EXAM: MRI HEAD WITHOUT CONTRAST MRA HEAD WITHOUT CONTRAST MRA NECK WITHOUT AND WITH CONTRAST TECHNIQUE: Multiplanar, multi-echo pulse sequences of the brain and surrounding structures were acquired without intravenous contrast. Angiographic images of the Circle of Willis were acquired using MRA technique without intravenous contrast. Angiographic images of the neck were acquired using MRA technique without  and with intravenous contrast. Carotid stenosis measurements (when applicable) are obtained utilizing NASCET criteria, using the distal internal carotid diameter as the denominator. CONTRAST:  5.655mGADAVIST GADOBUTROL 1 MMOL/ML IV SOLN COMPARISON:  11/14/2019 FINDINGS: MRI HEAD FINDINGS Brain: Acute/early subacute infarct of the right caudate tail. No acute hemorrhage. 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 No chronic microhemorrhage or siderosis. There is multifocal hyperintense T2-weighted signal within the white matter. Parenchymal volume and CSF spaces are normal. The midline structures are normal. Vascular: Normal flow voids. Skull and upper cervical spine: Normal marrow signal. Sinuses/Orbits: No acute or significant finding. Other: None. MRA HEAD FINDINGS POSTERIOR CIRCULATION: --Vertebral arteries: Normal --Inferior cerebellar arteries: Normal. --Basilar artery: Normal. --Superior cerebellar arteries: Normal. --Posterior cerebral arteries: Normal. ANTERIOR CIRCULATION: --Intracranial internal carotid arteries: Normal. --Anterior cerebral arteries (ACA): Normal. --Middle cerebral arteries (MCA): Normal. Anatomic variants: None MRA NECK FINDINGS Aortic arch: Normal 3 vessel branching pattern Right carotid system: Normal Left carotid system: Normal Vertebral arteries: Codominant. Moderate narrowing of the midportion of the left V2 segment but otherwise normal. Other: None IMPRESSION: 1. 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 No acute hemorrhage or mass effect. 2.  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 3. No emergent large vessel occlusion or high-grade stenosis. 4. Moderate narrowing of the midportion of the left vertebral artery V2 segment. Electronically Signed   By: Ulyses Jarred M.D.   On: 09/16/2022 21:09   MR ANGIO NECK W WO CONTRAST  Result Date: 09/16/2022 CLINICAL DATA:  Acute neurologic deficit. Left-sided weakness and fall. EXAM: MRI HEAD WITHOUT CONTRAST MRA HEAD WITHOUT CONTRAST MRA NECK WITHOUT  AND WITH CONTRAST TECHNIQUE: Multiplanar, multi-echo pulse sequences of the brain and surrounding structures were acquired without intravenous contrast. Angiographic images of the Circle of Willis were acquired using MRA technique without intravenous contrast. Angiographic images of the neck were acquired using MRA technique without and with intravenous contrast. Carotid stenosis measurements (when applicable) are obtained utilizing NASCET criteria, using the distal internal carotid diameter as the denominator. CONTRAST:  5.53m GADAVIST GADOBUTROL 1 MMOL/ML IV SOLN COMPARISON:  11/14/2019 FINDINGS: MRI HEAD FINDINGS Brain: Acute/early subacute infarct of the right caudate tail. No acute hemorrhage. 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 No chronic microhemorrhage or siderosis. There is multifocal hyperintense T2-weighted signal within the white matter. Parenchymal volume and CSF spaces are normal. The midline structures are normal. Vascular: Normal flow voids. Skull and upper cervical spine: Normal marrow signal. Sinuses/Orbits: No acute or significant finding. Other: None. MRA HEAD FINDINGS POSTERIOR CIRCULATION: --Vertebral arteries: Normal --Inferior cerebellar arteries: Normal. --Basilar artery: Normal. --Superior cerebellar arteries: Normal. --Posterior cerebral arteries: Normal. ANTERIOR CIRCULATION: --Intracranial internal carotid arteries: Normal. --Anterior cerebral arteries (ACA): Normal. --Middle cerebral arteries (MCA): Normal. Anatomic variants: None MRA NECK FINDINGS Aortic arch: Normal 3 vessel branching pattern Right carotid system: Normal Left carotid system: Normal Vertebral arteries: Codominant. Moderate narrowing of the midportion of the left V2 segment but otherwise normal. Other: None IMPRESSION: 1. 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 No acute hemorrhage or mass effect. 2. 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 3. No emergent large vessel occlusion or high-grade stenosis. 4. Moderate narrowing  of the midportion of the left vertebral artery V2 segment. Electronically Signed   By: KUlyses JarredM.D.   On: 09/16/2022 21:09   CT HEAD CODE STROKE WO CONTRAST  Result Date: 09/16/2022 CLINICAL DATA:  Code stroke. Neuro deficit, acute, stroke suspected at EXAM: CT HEAD WITHOUT CONTRAST TECHNIQUE: Contiguous axial images were obtained from the base of the skull through the vertex without intravenous contrast. RADIATION DOSE REDUCTION: This exam was performed according to the departmental dose-optimization program which includes automated exposure control, adjustment of the mA and/or kV according to patient size and/or use of iterative reconstruction technique. COMPARISON:  CT head February 10, 2020. FINDINGS: Mildly motion limited study. Brain: No evidence of acute large vascular territory infarct or acute hemorrhage. Remote right occipital infarct. No evidence of mass lesion, midline shift or hydrocephalus. Patchy white matter hypodensities, nonspecific but compatible with chronic microvascular ischemic disease. Streak artifact through the posterior fossa and right greater than left temporal lobes. Vascular: Right MCA stent.  No visible hyperdense vessel. Skull: No acute fracture. Sinuses/Orbits: Inferior left maxillary sinus mucosal thickening. No acute orbital findings. ASPECTS ([facility] Program Early CT Score) total score (0-10 with 10 being normal): 10. IMPRESSION: 1. No evidence of acute intracranial abnormality. ASPECTS is 10. 2. 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 3. Right MCA stent. Findings discussed with Dr. SQuinn Axevia telephone at 11:56 AM. Electronically Signed   By: FMargaretha SheffieldM.D.   On: 09/16/2022 11:57    DISCHARGE EXAMINATION: See progress note from earlier today  DISPOSITION: Home  Discharge Instructions     Call MD for:  difficulty breathing, headache or visual disturbances   Complete by: As directed    Call MD for:  extreme fatigue    Complete by: As directed    Call MD for:  persistant dizziness or light-headedness   Complete by: As directed    Call MD for:  persistant nausea and vomiting   Complete by: As directed    Call MD for:  severe uncontrolled pain   Complete by: As directed    Call MD for:  temperature >100.4   Complete by: As directed    Diet - low sodium heart healthy   Complete by: As directed    Discharge instructions   Complete by: As directed    Please be sure to follow up with your PCP next week for a BP check. Please take your medications as prescribed.  You were cared for by a hospitalist during your hospital stay. If you have any questions about your discharge medications or the care you received while you were in the hospital after you are discharged, you can call the unit and asked to speak with the hospitalist on call if the hospitalist that took care of you is not available. Once you are discharged, your primary care physician will handle any further medical issues. Please note that NO REFILLS for any discharge medications will be authorized once you are discharged, as it is imperative that you return to your primary care physician (or establish a relationship with a primary care physician if you do not have one) for your aftercare needs so that they can reassess your need for medications and monitor your lab values. If you do not have a primary care physician, you can call [phone] for a physician referral.   Increase activity slowly   Complete by: As directed    No wound care   Complete by: As directed           Allergies as of 09/17/2022   No Known Allergies      Medication List     STOP taking these medications    lovastatin 20 MG tablet Commonly known as: MEVACOR       TAKE these medications    amLODipine 5 MG tablet Commonly known as: NORVASC Take 1 tablet (5 mg total) by mouth daily.   aspirin EC 81 MG tablet Take 1 tablet (81 mg total) by mouth daily. Swallow  whole.   atorvastatin 40 MG tablet Commonly known as: LIPITOR Take 1 tablet (40 mg total) by mouth at bedtime.   cyanocobalamin 1000 MCG tablet Commonly known as: VITAMIN B12 Take 1,000 mcg by mouth 2 (two) times daily.   escitalopram 20 MG tablet Commonly known as: LEXAPRO Take 1 tablet (20 mg total) by mouth daily.   levothyroxine 112 MCG tablet Commonly known as: SYNTHROID TAKE 1 TABLET (112 MCG TOTAL) BY MOUTH DAILY BEFORE BREAKFAST. What changed: See the new instructions.   lidocaine-prilocaine cream Commonly known as: EMLA Apply topically to affected area(s) as needed. What changed: reasons to take this   lipase/protease/amylase 36000 UNITS Cpep capsule Commonly known as: CREON Take 1 capsule (36,000 Units total) by mouth 3 (three) times daily before meals. What changed: how much to take   mirtazapine 7.5 MG tablet Commonly known as: REMERON Take 1 tablet (7.5 mg total) by mouth at bedtime.   pantoprazole 40 MG tablet Commonly known as: PROTONIX Take 1 tablet by mouth daily.  prochlorperazine 10 MG tablet Commonly known as: COMPAZINE Take 1 tablet (10 mg total) by mouth every 6 (six) hours as needed (Nausea or vomiting).   Promacta 50 MG tablet Generic drug: eltrombopag Take 1 tablet (50 mg total) by mouth daily. Take on an empty stomach 1 hour before a meal or 2 hours after   Vitamin D3 50 MCG (2000 UT) Tabs Take 2,000 Units by mouth daily.          Follow-up Information     Ronnell Freshwater, NP. Schedule an appointment as soon as possible for a visit in 1 week(s).   Specialty: Family Medicine Why: post hospitalization follow up Contact information: [place] [address] [phone]         [facility], [facility] Follow up.   Specialty: [place] Why: home health, they will call to set up an appointmenttime to come out to the house Contact information: [place]  [address] [phone]                 TOTAL DISCHARGE TIME: 35 minutes  [place]  [facility] Pager on [email or web address]  09/18/2022, 10:22 AM

## 2022-09-19 ENCOUNTER — Inpatient Hospital Stay: Payer: Medicare HMO | Admitting: Hematology

## 2022-09-19 ENCOUNTER — Other Ambulatory Visit (HOSPITAL_COMMUNITY): Payer: Self-pay

## 2022-09-19 ENCOUNTER — Ambulatory Visit: Payer: Medicare HMO

## 2022-09-19 ENCOUNTER — Telehealth: Payer: Self-pay | Admitting: Radiation Oncology

## 2022-09-19 ENCOUNTER — Other Ambulatory Visit: Payer: Self-pay

## 2022-09-19 ENCOUNTER — Inpatient Hospital Stay: Payer: Medicare HMO

## 2022-09-19 ENCOUNTER — Inpatient Hospital Stay: Payer: Medicare HMO | Attending: Physician Assistant | Admitting: Licensed Clinical Social Worker

## 2022-09-19 DIAGNOSIS — D469 Myelodysplastic syndrome, unspecified: Secondary | ICD-10-CM | POA: Insufficient documentation

## 2022-09-19 DIAGNOSIS — C251 Malignant neoplasm of body of pancreas: Secondary | ICD-10-CM

## 2022-09-19 DIAGNOSIS — C257 Malignant neoplasm of other parts of pancreas: Secondary | ICD-10-CM | POA: Insufficient documentation

## 2022-09-19 DIAGNOSIS — Z8673 Personal history of transient ischemic attack (TIA), and cerebral infarction without residual deficits: Secondary | ICD-10-CM | POA: Insufficient documentation

## 2022-09-19 NOTE — Telephone Encounter (Signed)
I spoke with the patient's daughter and she is concerned about needing help getting her dad in and out of the car to come for treatment due to limited mobility from a stroke he recently had last week. He needs transportation assistance in and out of the car from home and will be working with PT through home health. I told his daughter I'd try to see if social work might know better transportation options so he can complete his radiation.

## 2022-09-19 NOTE — Progress Notes (Signed)
[place] CSW Progress Note  Holiday representative  spoke w/ pt's daughter regarding transportation concerns to get pt to radiation.  Pt was admitted to the hospital due to a stroke and is having difficulty ambulating.  Per daughter pt needs a wheelchair to assist to and from the car.  CSW provided pt's daughter w/ contact information for the [employer] who should be able to provide pt w/ a free wheelchair.  Pt's daughter has found someone who has also offered to build a ramp to assist w/ getting pt in and out of the house.  CSW also encouraged pt's daughter to check w/ pt's insurance to see if he has stretcher transportation benefits given his recent stroke should she not be able to get pt in and out of the car.  Pt's daughter also inquired about pt's Promacta.  Pt received a grant which paid for the medication previously; however, the pt is almost out of the medication.  CSW checked w/ the pharmacy who confirmed pt still has funds available from the grant to continue to pay for the medication.  Pt's daughter informed and will pick up the medication at pt's next appointment.  Pt's daughter to contact CSW if she is unable to get a wheelchair for pt or has concerns regarding transportation after she tries the wheelchair.        Henriette Combs, LCSW

## 2022-09-20 ENCOUNTER — Other Ambulatory Visit: Payer: Self-pay

## 2022-09-20 ENCOUNTER — Telehealth: Payer: Self-pay | Admitting: Hematology

## 2022-09-20 ENCOUNTER — Ambulatory Visit
Admission: RE | Admit: 2022-09-20 | Discharge: 2022-09-20 | Disposition: A | Discharge status: Home / Self Care | Payer: Medicare HMO | Source: Ambulatory Visit | Attending: Radiation Oncology

## 2022-09-20 ENCOUNTER — Ambulatory Visit: Payer: Medicare HMO

## 2022-09-20 ENCOUNTER — Other Ambulatory Visit (HOSPITAL_COMMUNITY): Payer: Self-pay

## 2022-09-20 ENCOUNTER — Telehealth: Payer: Self-pay

## 2022-09-20 DIAGNOSIS — Z510 Encounter for antineoplastic radiation therapy: Secondary | ICD-10-CM | POA: Diagnosis not present

## 2022-09-20 DIAGNOSIS — C251 Malignant neoplasm of body of pancreas: Secondary | ICD-10-CM | POA: Diagnosis not present

## 2022-09-20 LAB — RAD ONC ARIA SESSION SUMMARY
Course Elapsed Days: 5
Plan Fractions Treated to Date: 2
Plan Prescribed Dose Per Fraction: 5 Gy
Plan Total Fractions Prescribed: 10
Plan Total Prescribed Dose: 50 Gy
Reference Point Dosage Given to Date: 10 Gy
Reference Point Session Dosage Given: 5 Gy
Session Number: 2

## 2022-09-20 NOTE — Telephone Encounter (Addendum)
Appointments have been made and patient is aware.     ----- Message from Evalee Jefferson, RN sent at 09/19/2022  3:32 PM EST ----- Regarding: F/u appt w/labs [place],  Please schedule pt for a f/u with Dr. Burr Medico in the next 2 to 3 wks.  Dr. Ernestina Penna office was notified by Trilby Leaver that this pt had a stroke about 1 to 2 wks ago.  Please schedule pt for lab and f/u with Dr. Burr Medico.  Please confirm pt's appt with his daughter.    Thanks,  Tammi Sou

## 2022-09-20 NOTE — Telephone Encounter (Signed)
Contacted patient to scheduled appointments. Patient is aware of appointments that are scheduled.   

## 2022-09-21 ENCOUNTER — Ambulatory Visit
Admission: RE | Admit: 2022-09-21 | Discharge: 2022-09-21 | Disposition: A | Discharge status: Home / Self Care | Payer: Medicare HMO | Source: Ambulatory Visit | Attending: Radiation Oncology | Admitting: Radiation Oncology

## 2022-09-21 ENCOUNTER — Other Ambulatory Visit: Payer: Self-pay

## 2022-09-21 DIAGNOSIS — I119 Hypertensive heart disease without heart failure: Secondary | ICD-10-CM | POA: Diagnosis not present

## 2022-09-21 DIAGNOSIS — I69354 Hemiplegia and hemiparesis following cerebral infarction affecting left non-dominant side: Secondary | ICD-10-CM | POA: Diagnosis not present

## 2022-09-21 DIAGNOSIS — Z510 Encounter for antineoplastic radiation therapy: Secondary | ICD-10-CM | POA: Diagnosis not present

## 2022-09-21 DIAGNOSIS — C779 Secondary and unspecified malignant neoplasm of lymph node, unspecified: Secondary | ICD-10-CM | POA: Diagnosis not present

## 2022-09-21 DIAGNOSIS — I7121 Aneurysm of the ascending aorta, without rupture: Secondary | ICD-10-CM | POA: Diagnosis not present

## 2022-09-21 DIAGNOSIS — D469 Myelodysplastic syndrome, unspecified: Secondary | ICD-10-CM | POA: Diagnosis not present

## 2022-09-21 DIAGNOSIS — C259 Malignant neoplasm of pancreas, unspecified: Secondary | ICD-10-CM | POA: Diagnosis not present

## 2022-09-21 DIAGNOSIS — I081 Rheumatic disorders of both mitral and tricuspid valves: Secondary | ICD-10-CM | POA: Diagnosis not present

## 2022-09-21 DIAGNOSIS — I8393 Asymptomatic varicose veins of bilateral lower extremities: Secondary | ICD-10-CM | POA: Diagnosis not present

## 2022-09-21 DIAGNOSIS — C251 Malignant neoplasm of body of pancreas: Secondary | ICD-10-CM | POA: Diagnosis not present

## 2022-09-21 DIAGNOSIS — D630 Anemia in neoplastic disease: Secondary | ICD-10-CM | POA: Diagnosis not present

## 2022-09-21 LAB — RAD ONC ARIA SESSION SUMMARY
Course Elapsed Days: 6
Plan Fractions Treated to Date: 3
Plan Prescribed Dose Per Fraction: 5 Gy
Plan Total Fractions Prescribed: 10
Plan Total Prescribed Dose: 50 Gy
Reference Point Dosage Given to Date: 15 Gy
Reference Point Session Dosage Given: 5 Gy
Session Number: 3

## 2022-09-22 ENCOUNTER — Other Ambulatory Visit: Payer: Self-pay

## 2022-09-22 ENCOUNTER — Ambulatory Visit
Admission: RE | Admit: 2022-09-22 | Discharge: 2022-09-22 | Disposition: A | Discharge status: Home / Self Care | Payer: Medicare HMO | Source: Ambulatory Visit | Attending: Radiation Oncology | Admitting: Radiation Oncology

## 2022-09-22 DIAGNOSIS — C251 Malignant neoplasm of body of pancreas: Secondary | ICD-10-CM | POA: Diagnosis not present

## 2022-09-22 DIAGNOSIS — I7121 Aneurysm of the ascending aorta, without rupture: Secondary | ICD-10-CM | POA: Diagnosis not present

## 2022-09-22 DIAGNOSIS — C779 Secondary and unspecified malignant neoplasm of lymph node, unspecified: Secondary | ICD-10-CM | POA: Diagnosis not present

## 2022-09-22 DIAGNOSIS — D630 Anemia in neoplastic disease: Secondary | ICD-10-CM | POA: Diagnosis not present

## 2022-09-22 DIAGNOSIS — D469 Myelodysplastic syndrome, unspecified: Secondary | ICD-10-CM | POA: Diagnosis not present

## 2022-09-22 DIAGNOSIS — I8393 Asymptomatic varicose veins of bilateral lower extremities: Secondary | ICD-10-CM | POA: Diagnosis not present

## 2022-09-22 DIAGNOSIS — I119 Hypertensive heart disease without heart failure: Secondary | ICD-10-CM | POA: Diagnosis not present

## 2022-09-22 DIAGNOSIS — C259 Malignant neoplasm of pancreas, unspecified: Secondary | ICD-10-CM | POA: Diagnosis not present

## 2022-09-22 DIAGNOSIS — I081 Rheumatic disorders of both mitral and tricuspid valves: Secondary | ICD-10-CM | POA: Diagnosis not present

## 2022-09-22 DIAGNOSIS — I69354 Hemiplegia and hemiparesis following cerebral infarction affecting left non-dominant side: Secondary | ICD-10-CM | POA: Diagnosis not present

## 2022-09-22 DIAGNOSIS — Z510 Encounter for antineoplastic radiation therapy: Secondary | ICD-10-CM | POA: Diagnosis not present

## 2022-09-22 LAB — RAD ONC ARIA SESSION SUMMARY
Course Elapsed Days: 7
Plan Fractions Treated to Date: 4
Plan Prescribed Dose Per Fraction: 5 Gy
Plan Total Fractions Prescribed: 10
Plan Total Prescribed Dose: 50 Gy
Reference Point Dosage Given to Date: 20 Gy
Reference Point Session Dosage Given: 5 Gy
Session Number: 4

## 2022-09-23 ENCOUNTER — Ambulatory Visit
Admission: RE | Admit: 2022-09-23 | Discharge: 2022-09-23 | Disposition: A | Discharge status: Home / Self Care | Payer: Medicare HMO | Source: Ambulatory Visit | Attending: Radiation Oncology | Admitting: Radiation Oncology

## 2022-09-23 ENCOUNTER — Other Ambulatory Visit: Payer: Self-pay

## 2022-09-23 ENCOUNTER — Ambulatory Visit
Admission: RE | Admit: 2022-09-23 | Discharge: 2022-09-23 | Disposition: A | Discharge status: Home / Self Care | Payer: Medicare HMO | Source: Ambulatory Visit | Attending: Radiation Oncology

## 2022-09-23 DIAGNOSIS — Z510 Encounter for antineoplastic radiation therapy: Secondary | ICD-10-CM | POA: Diagnosis not present

## 2022-09-23 DIAGNOSIS — C251 Malignant neoplasm of body of pancreas: Secondary | ICD-10-CM | POA: Diagnosis not present

## 2022-09-23 LAB — RAD ONC ARIA SESSION SUMMARY
Course Elapsed Days: 8
Plan Fractions Treated to Date: 5
Plan Prescribed Dose Per Fraction: 5 Gy
Plan Total Fractions Prescribed: 10
Plan Total Prescribed Dose: 50 Gy
Reference Point Dosage Given to Date: 25 Gy
Reference Point Session Dosage Given: 5 Gy
Session Number: 5

## 2022-09-26 ENCOUNTER — Ambulatory Visit
Admission: RE | Admit: 2022-09-26 | Discharge: 2022-09-26 | Disposition: A | Discharge status: Home / Self Care | Payer: Medicare HMO | Source: Ambulatory Visit | Attending: Radiation Oncology | Admitting: Radiation Oncology

## 2022-09-26 ENCOUNTER — Telehealth: Payer: Self-pay | Admitting: Hematology

## 2022-09-26 ENCOUNTER — Other Ambulatory Visit: Payer: Self-pay | Admitting: Nurse Practitioner

## 2022-09-26 ENCOUNTER — Other Ambulatory Visit: Payer: Self-pay

## 2022-09-26 DIAGNOSIS — E039 Hypothyroidism, unspecified: Secondary | ICD-10-CM

## 2022-09-26 DIAGNOSIS — I8393 Asymptomatic varicose veins of bilateral lower extremities: Secondary | ICD-10-CM | POA: Diagnosis not present

## 2022-09-26 DIAGNOSIS — D469 Myelodysplastic syndrome, unspecified: Secondary | ICD-10-CM | POA: Diagnosis not present

## 2022-09-26 DIAGNOSIS — I69354 Hemiplegia and hemiparesis following cerebral infarction affecting left non-dominant side: Secondary | ICD-10-CM | POA: Diagnosis not present

## 2022-09-26 DIAGNOSIS — F321 Major depressive disorder, single episode, moderate: Secondary | ICD-10-CM

## 2022-09-26 DIAGNOSIS — Z510 Encounter for antineoplastic radiation therapy: Secondary | ICD-10-CM | POA: Diagnosis not present

## 2022-09-26 DIAGNOSIS — D630 Anemia in neoplastic disease: Secondary | ICD-10-CM | POA: Diagnosis not present

## 2022-09-26 DIAGNOSIS — I7121 Aneurysm of the ascending aorta, without rupture: Secondary | ICD-10-CM | POA: Diagnosis not present

## 2022-09-26 DIAGNOSIS — I081 Rheumatic disorders of both mitral and tricuspid valves: Secondary | ICD-10-CM | POA: Diagnosis not present

## 2022-09-26 DIAGNOSIS — C251 Malignant neoplasm of body of pancreas: Secondary | ICD-10-CM | POA: Diagnosis not present

## 2022-09-26 DIAGNOSIS — C259 Malignant neoplasm of pancreas, unspecified: Secondary | ICD-10-CM | POA: Diagnosis not present

## 2022-09-26 DIAGNOSIS — I119 Hypertensive heart disease without heart failure: Secondary | ICD-10-CM | POA: Diagnosis not present

## 2022-09-26 DIAGNOSIS — C779 Secondary and unspecified malignant neoplasm of lymph node, unspecified: Secondary | ICD-10-CM | POA: Diagnosis not present

## 2022-09-26 LAB — RAD ONC ARIA SESSION SUMMARY
Course Elapsed Days: 11
Plan Fractions Treated to Date: 6
Plan Prescribed Dose Per Fraction: 5 Gy
Plan Total Fractions Prescribed: 10
Plan Total Prescribed Dose: 50 Gy
Reference Point Dosage Given to Date: 30 Gy
Reference Point Session Dosage Given: 5 Gy
Session Number: 6

## 2022-09-26 MED ORDER — ESCITALOPRAM OXALATE 20 MG PO TABS: 20.0000 mg | ORAL_TABLET | Freq: Every day | ORAL | 1 refills | Status: AC

## 2022-09-26 MED ORDER — LEVOTHYROXINE SODIUM 112 MCG PO TABS: 112.0000 ug | ORAL_TABLET | Freq: Every day | ORAL | 1 refills | Status: AC

## 2022-09-26 NOTE — Telephone Encounter (Signed)
Contacted patient to scheduled appointments. Left message with appointment details and a call back number if patient had any questions or could not accommodate the time we provided.   

## 2022-09-26 NOTE — Telephone Encounter (Signed)
L.O.V: 02/03/22  N.O.V: Not scheduled  L.R.F: 10/19/21 Escitalopram 90 tab 1 refill              03/28/22 Levothyroxine 90 tab 1 refill   Refill denied. OV required

## 2022-09-26 NOTE — Telephone Encounter (Signed)
LVM for return call. 

## 2022-09-26 NOTE — Telephone Encounter (Signed)
Pt daughter calling about refills informed her that pt needs appointment. Appointment scheduled and she would like to know if pt can have medication sent in to last until appointment.     L.O.V: 02/03/22   N.O.V: 11/01/22   L.R.F: 10/19/21 Escitalopram 90 tab 1 refill                   03/28/22 Levothyroxine 90 tab 1 refill     [facility] - [place], [place] - [address]

## 2022-09-26 NOTE — Telephone Encounter (Signed)
I have filled these and sent them to [facility]

## 2022-09-27 ENCOUNTER — Other Ambulatory Visit: Payer: Self-pay

## 2022-09-27 ENCOUNTER — Ambulatory Visit
Admission: RE | Admit: 2022-09-27 | Discharge: 2022-09-27 | Disposition: A | Discharge status: Home / Self Care | Payer: Medicare HMO | Source: Ambulatory Visit | Attending: Radiation Oncology | Admitting: Radiation Oncology

## 2022-09-27 DIAGNOSIS — Z510 Encounter for antineoplastic radiation therapy: Secondary | ICD-10-CM | POA: Diagnosis not present

## 2022-09-27 DIAGNOSIS — C251 Malignant neoplasm of body of pancreas: Secondary | ICD-10-CM | POA: Diagnosis not present

## 2022-09-27 LAB — RAD ONC ARIA SESSION SUMMARY
Course Elapsed Days: 12
Plan Fractions Treated to Date: 7
Plan Prescribed Dose Per Fraction: 5 Gy
Plan Total Fractions Prescribed: 10
Plan Total Prescribed Dose: 50 Gy
Reference Point Dosage Given to Date: 35 Gy
Reference Point Session Dosage Given: 5 Gy
Session Number: 7

## 2022-09-28 ENCOUNTER — Other Ambulatory Visit: Payer: Self-pay

## 2022-09-28 ENCOUNTER — Ambulatory Visit
Admission: RE | Admit: 2022-09-28 | Discharge: 2022-09-28 | Disposition: A | Discharge status: Home / Self Care | Payer: Medicare HMO | Source: Ambulatory Visit | Attending: Radiation Oncology

## 2022-09-28 DIAGNOSIS — C251 Malignant neoplasm of body of pancreas: Secondary | ICD-10-CM | POA: Diagnosis not present

## 2022-09-28 DIAGNOSIS — Z510 Encounter for antineoplastic radiation therapy: Secondary | ICD-10-CM | POA: Diagnosis not present

## 2022-09-28 LAB — RAD ONC ARIA SESSION SUMMARY
Course Elapsed Days: 13
Plan Fractions Treated to Date: 8
Plan Prescribed Dose Per Fraction: 5 Gy
Plan Total Fractions Prescribed: 10
Plan Total Prescribed Dose: 50 Gy
Reference Point Dosage Given to Date: 40 Gy
Reference Point Session Dosage Given: 5 Gy
Session Number: 8

## 2022-09-29 ENCOUNTER — Other Ambulatory Visit: Payer: Self-pay

## 2022-09-29 ENCOUNTER — Ambulatory Visit
Admission: RE | Admit: 2022-09-29 | Discharge: 2022-09-29 | Disposition: A | Discharge status: Home / Self Care | Payer: Medicare HMO | Source: Ambulatory Visit | Attending: Radiation Oncology | Admitting: Radiation Oncology

## 2022-09-29 DIAGNOSIS — I081 Rheumatic disorders of both mitral and tricuspid valves: Secondary | ICD-10-CM | POA: Diagnosis not present

## 2022-09-29 DIAGNOSIS — I69354 Hemiplegia and hemiparesis following cerebral infarction affecting left non-dominant side: Secondary | ICD-10-CM | POA: Diagnosis not present

## 2022-09-29 DIAGNOSIS — I119 Hypertensive heart disease without heart failure: Secondary | ICD-10-CM | POA: Diagnosis not present

## 2022-09-29 DIAGNOSIS — I7121 Aneurysm of the ascending aorta, without rupture: Secondary | ICD-10-CM | POA: Diagnosis not present

## 2022-09-29 DIAGNOSIS — D469 Myelodysplastic syndrome, unspecified: Secondary | ICD-10-CM | POA: Diagnosis not present

## 2022-09-29 DIAGNOSIS — C259 Malignant neoplasm of pancreas, unspecified: Secondary | ICD-10-CM | POA: Diagnosis not present

## 2022-09-29 DIAGNOSIS — I8393 Asymptomatic varicose veins of bilateral lower extremities: Secondary | ICD-10-CM | POA: Diagnosis not present

## 2022-09-29 DIAGNOSIS — D630 Anemia in neoplastic disease: Secondary | ICD-10-CM | POA: Diagnosis not present

## 2022-09-29 DIAGNOSIS — Z510 Encounter for antineoplastic radiation therapy: Secondary | ICD-10-CM | POA: Diagnosis not present

## 2022-09-29 DIAGNOSIS — C251 Malignant neoplasm of body of pancreas: Secondary | ICD-10-CM | POA: Diagnosis not present

## 2022-09-29 DIAGNOSIS — C779 Secondary and unspecified malignant neoplasm of lymph node, unspecified: Secondary | ICD-10-CM | POA: Diagnosis not present

## 2022-09-29 LAB — RAD ONC ARIA SESSION SUMMARY
Course Elapsed Days: 14
Plan Fractions Treated to Date: 9
Plan Prescribed Dose Per Fraction: 5 Gy
Plan Total Fractions Prescribed: 10
Plan Total Prescribed Dose: 50 Gy
Reference Point Dosage Given to Date: 45 Gy
Reference Point Session Dosage Given: 5 Gy
Session Number: 9

## 2022-09-30 ENCOUNTER — Other Ambulatory Visit: Payer: Self-pay

## 2022-09-30 ENCOUNTER — Ambulatory Visit
Admission: RE | Admit: 2022-09-30 | Discharge: 2022-09-30 | Disposition: A | Discharge status: Home / Self Care | Payer: Medicare HMO | Source: Ambulatory Visit | Attending: Radiation Oncology | Admitting: Radiation Oncology

## 2022-09-30 ENCOUNTER — Encounter: Payer: Self-pay | Admitting: Radiation Oncology

## 2022-09-30 DIAGNOSIS — C251 Malignant neoplasm of body of pancreas: Secondary | ICD-10-CM | POA: Diagnosis not present

## 2022-09-30 DIAGNOSIS — Z510 Encounter for antineoplastic radiation therapy: Secondary | ICD-10-CM | POA: Diagnosis not present

## 2022-09-30 LAB — RAD ONC ARIA SESSION SUMMARY
Course Elapsed Days: 15
Plan Fractions Treated to Date: 10
Plan Prescribed Dose Per Fraction: 5 Gy
Plan Total Fractions Prescribed: 10
Plan Total Prescribed Dose: 50 Gy
Reference Point Dosage Given to Date: 50 Gy
Reference Point Session Dosage Given: 5 Gy
Session Number: 10

## 2022-10-03 DIAGNOSIS — I8393 Asymptomatic varicose veins of bilateral lower extremities: Secondary | ICD-10-CM | POA: Diagnosis not present

## 2022-10-03 DIAGNOSIS — C779 Secondary and unspecified malignant neoplasm of lymph node, unspecified: Secondary | ICD-10-CM | POA: Diagnosis not present

## 2022-10-03 DIAGNOSIS — D630 Anemia in neoplastic disease: Secondary | ICD-10-CM | POA: Diagnosis not present

## 2022-10-03 DIAGNOSIS — I7121 Aneurysm of the ascending aorta, without rupture: Secondary | ICD-10-CM | POA: Diagnosis not present

## 2022-10-03 DIAGNOSIS — D469 Myelodysplastic syndrome, unspecified: Secondary | ICD-10-CM | POA: Diagnosis not present

## 2022-10-03 DIAGNOSIS — I69354 Hemiplegia and hemiparesis following cerebral infarction affecting left non-dominant side: Secondary | ICD-10-CM | POA: Diagnosis not present

## 2022-10-03 DIAGNOSIS — C259 Malignant neoplasm of pancreas, unspecified: Secondary | ICD-10-CM | POA: Diagnosis not present

## 2022-10-03 DIAGNOSIS — I081 Rheumatic disorders of both mitral and tricuspid valves: Secondary | ICD-10-CM | POA: Diagnosis not present

## 2022-10-03 DIAGNOSIS — I119 Hypertensive heart disease without heart failure: Secondary | ICD-10-CM | POA: Diagnosis not present

## 2022-10-04 ENCOUNTER — Encounter: Payer: Self-pay | Admitting: Hematology

## 2022-10-04 NOTE — Progress Notes (Signed)
                                                                                                                                                             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 He did have fatigue, and had poor oral intake and was encouraged to increase his fluid intake.   Plan: The patient will receive a call in about one month from the radiation oncology department. He will continue follow up with Dr. Burr Medico as well.   ________________________________________________    Carola Rhine, [facility]

## 2022-10-05 ENCOUNTER — Telehealth: Payer: Self-pay | Admitting: Hematology

## 2022-10-05 NOTE — Telephone Encounter (Signed)
Contacted patient to scheduled appointments. Patient is aware of appointments that are scheduled.   

## 2022-10-06 ENCOUNTER — Inpatient Hospital Stay: Payer: Medicare HMO

## 2022-10-06 ENCOUNTER — Encounter: Payer: Self-pay | Admitting: Hematology

## 2022-10-06 ENCOUNTER — Inpatient Hospital Stay (HOSPITAL_BASED_OUTPATIENT_CLINIC_OR_DEPARTMENT_OTHER): Payer: Medicare HMO | Admitting: Hematology

## 2022-10-06 VITALS — BP 155/76 | HR 62 | Temp 98.4°F | Resp 17 | Wt 122.8 lb

## 2022-10-06 DIAGNOSIS — D469 Myelodysplastic syndrome, unspecified: Secondary | ICD-10-CM

## 2022-10-06 DIAGNOSIS — C251 Malignant neoplasm of body of pancreas: Secondary | ICD-10-CM

## 2022-10-06 DIAGNOSIS — Z8673 Personal history of transient ischemic attack (TIA), and cerebral infarction without residual deficits: Secondary | ICD-10-CM | POA: Diagnosis not present

## 2022-10-06 DIAGNOSIS — Z95828 Presence of other vascular implants and grafts: Secondary | ICD-10-CM

## 2022-10-06 DIAGNOSIS — C257 Malignant neoplasm of other parts of pancreas: Secondary | ICD-10-CM | POA: Diagnosis not present

## 2022-10-06 DIAGNOSIS — C250 Malignant neoplasm of head of pancreas: Secondary | ICD-10-CM

## 2022-10-06 LAB — CMP (CANCER CENTER ONLY)
ALT: 8 U/L (ref 0–44)
AST: 18 U/L (ref 15–41)
Albumin: 3.5 g/dL (ref 3.5–5.0)
Alkaline Phosphatase: 61 U/L (ref 38–126)
Anion gap: 4 — ABNORMAL LOW (ref 5–15)
BUN: 23 mg/dL (ref 8–23)
CO2: 30 mmol/L (ref 22–32)
Calcium: 8.4 mg/dL — ABNORMAL LOW (ref 8.9–10.3)
Chloride: 103 mmol/L (ref 98–111)
Creatinine: 0.97 mg/dL (ref 0.61–1.24)
GFR, Estimated: >60 mL/min (ref >60)
Glucose, Bld: 94 mg/dL (ref 70–99)
Potassium: 4.1 mmol/L (ref 3.5–5.1)
Sodium: 137 mmol/L (ref 135–145)
Total Bilirubin: 0.5 mg/dL (ref 0.3–1.2)
Total Protein: 6.3 g/dL — ABNORMAL LOW (ref 6.5–8.1)

## 2022-10-06 LAB — CBC WITH DIFFERENTIAL (CANCER CENTER ONLY)
Abs Immature Granulocytes: 0.01 10*3/uL (ref 0.00–0.07)
Basophils Absolute: 0 10*3/uL (ref 0.0–0.1)
Basophils Relative: 0 %
Eosinophils Absolute: 0.2 10*3/uL (ref 0.0–0.5)
Eosinophils Relative: 5 %
HCT: 27.5 % — ABNORMAL LOW (ref 39.0–52.0)
Hemoglobin: 9.2 g/dL — ABNORMAL LOW (ref 13.0–17.0)
Immature Granulocytes: 0 %
Lymphocytes Relative: 20 %
Lymphs Abs: 0.9 10*3/uL (ref 0.7–4.0)
MCH: 28.2 pg (ref 26.0–34.0)
MCHC: 33.5 g/dL (ref 30.0–36.0)
MCV: 84.4 fL (ref 80.0–100.0)
Monocytes Absolute: 0.7 10*3/uL (ref 0.1–1.0)
Monocytes Relative: 14 %
Neutro Abs: 2.9 10*3/uL (ref 1.7–7.7)
Neutrophils Relative %: 61 %
Platelet Count: 128 10*3/uL — ABNORMAL LOW (ref 150–400)
RBC: 3.26 MIL/uL — ABNORMAL LOW (ref 4.22–5.81)
RDW: 14.6 % (ref 11.5–15.5)
WBC Count: 4.7 10*3/uL (ref 4.0–10.5)
nRBC: 0 % (ref 0.0–0.2)

## 2022-10-06 LAB — SAMPLE TO BLOOD BANK

## 2022-10-06 MED ORDER — SODIUM CHLORIDE 0.9% FLUSH
10.0000 mL | INTRAVENOUS | Status: DC | PRN
  Administered 2022-10-06: 10 mL

## 2022-10-06 MED ORDER — HEPARIN SOD (PORK) LOCK FLUSH 100 UNIT/ML IV SOLN
500.0000 [IU] | Freq: Once | INTRAVENOUS | Status: AC | PRN
  Administered 2022-10-06: 500 [IU]

## 2022-10-06 NOTE — Assessment & Plan Note (Signed)
cT2N0M0, stage IB, ypT2N0  -diagnosed in 07/2021 -received neoadjuvant chemo gemcitabine alone on 08/20/21. Abraxane was added with cycle 2 (09/17/21), but he tolerated poorly and discontinued after first dose.  --s/p whipple procedure on 11/15/21 with Dr. Zenia Resides. 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 Margins and lymph nodes negative. -he restarted adjuvant gemcitabine on 01/20/22, completed 06/18/2022 -recent CT 05/03/2022 showed NED -restaging CT from 08/17/22 showed small soft tissue nodule or lymph node interposed between the anterior aorta, portal vein, left renal vein, and superior mesenteric artery measuring 1.3 x 0.8 cm. This is new or at least significantly increased compared to prior CT examination dated 05/03/2022 and suspicious for local recurrence or nodal metastasis -unfortunately biopsy is not feasible due to location  -I recommend SBRT given the oligo mets, he completed 09/30/2022 -Will repeat CT scan in April 2024

## 2022-10-06 NOTE — Progress Notes (Signed)
[place]   Telephone:([phone]) [phone] Fax:(336) [phone]   Clinic Follow up Note   Patient Care Team: Ronnell Freshwater, NP as PCP - General (Family Medicine) Buford Dresser, MD as PCP - Cardiology (Cardiology) Truitt Merle, MD as Consulting Physician (Oncology) Royston Bake, RN as Oncology Nurse Navigator (Oncology)  Date of Service:  10/06/2022  CHIEF COMPLAINT: f/u of  pancreatic cancer   CURRENT THERAPY:  Surveillance    ASSESSMENT:  Angel Keller. is a 82 y.o. male with   MDS (myelodysplastic syndrome) ([place]) -developed worsening thrombocytopenia in December 2023, required a platelet transfusion.  -Bone marrow seen on July 28, 2022 showed hypercellular marrow, megakaryocytes dyspoiesis, supports MDS.  Biopsy was negative for metastatic adenocarcinoma.  -cytogenetics and MDS FISH were normal  -he has responded well to Promacta -Due to his recent stroke, will keep Promacta at low-dose, and keep platelet count in 100-200 range  Pancreatic cancer ([place]) cT2N0M0, stage IB, ypT2N0  -diagnosed in 07/2021 -received neoadjuvant chemo gemcitabine alone on 08/20/21. Abraxane was added with cycle 2 (09/17/21), but he tolerated poorly and discontinued after first dose.  --s/p whipple procedure on 11/15/21 with Dr. Zenia Resides. 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 Margins and lymph nodes negative. -he restarted adjuvant gemcitabine on 01/20/22, completed 06/18/2022 -recent CT 05/03/2022 showed NED -restaging CT from 08/17/22 showed small soft tissue nodule or lymph node interposed between the anterior aorta, portal vein, left renal vein, and superior mesenteric artery measuring 1.3 x 0.8 cm. This is new or at least significantly increased compared to prior CT examination dated 05/03/2022 and suspicious for local recurrence or nodal metastasis -unfortunately biopsy is not feasible due to location  -I recommend SBRT given  the oligo mets, he completed 09/30/2022 -Will repeat CT scan in 3 months  Recent stroke  -he is recovering well, with minimal residual weakness, he will continue home PT  PLAN: -lab reviewed -lab /port flush and f/u in 3 month -Order CT scan 12/2022   SUMMARY OF ONCOLOGIC HISTORY: Oncology History Overview Note   Cancer Staging  Pancreatic cancer [facility]) Staging form: Exocrine Pancreas, AJCC 8th Edition - Clinical stage from 08/05/2021: Stage IB (cT2, cN0, cM0) - Signed by Truitt Merle, MD on 08/17/2021 Stage prefix: Initial diagnosis Total positive nodes: 0     Pancreatic mass  07/18/2021 Initial Diagnosis   Pancreatic mass   07/18/2021 Imaging   CT Abdomen Pelvis W Contrast  IMPRESSION: 1. Suspected solid mass within the proximal body of the pancreas measures 2.3 x 2.6 x 2.0 cm. There is downstream dilation of the main pancreatic duct and its branches. There is also intra and extrahepatic biliary ductal dilation. Further evaluation with MRI of the abdomen, pancreatic protocol, when clinically feasible, may be considered. 2. 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 3. Aortic atherosclerosis   07/20/2021 Procedure   DG ERCP  IMPRESSION: Nondiagnostic ERCP as above. Correlation with the operative report is advised.  - The examination was suspicious for a biliary stricture in the bile duct. - Examination was suspicious for carcinoma of the head of the pancreas. - Attempts at a cholangiogram failed.   07/22/2021 Pathology Results   CASE: [id number]   FINAL MICROSCOPIC DIAGNOSIS:  - Suspicious for malignancy  - See comment   DIAGNOSTIC COMMENTS:  There are rare cells with cytologic atypia suspicious for  adenocarcinoma.  Dr. Vic Ripper agrees.    07/22/2021 Procedure   IR INT EXT BILIARY DRAIN WITH  CHOLANGIOGRAM ( IMPRESSION: 1. 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 2. Successful  brush biopsy of obstructing lesion x3. 3. Successful placement of a 10 French internal/external biliary drainage catheter.   PLAN: 1. Follow bilirubin. When significant downward trend is clear, the bag should be capped. Recommend capping bag before discharge home if possible. 2. Follow-up in IR in 4-6 weeks for initial biliary tube check and exchange. If initial brush biopsies are negative, repeat biopsy could be considered at that time.   Pancreatic cancer ([place])  08/05/2021 Cancer Staging   Staging form: Exocrine Pancreas, AJCC 8th Edition - Clinical stage from 08/05/2021: Stage IB (cT2, cN0, cM0) - Signed by Truitt Merle, MD on 08/17/2021 Stage prefix: Initial diagnosis Total positive nodes: 0   08/14/2021 Initial Diagnosis   Pancreatic cancer ([place])   08/20/2021 - 03/23/2022 Chemotherapy   Patient is on Treatment Plan : PANCREATIC Abraxane / Gemcitabine D1,8 q21d     08/20/2021 -  Chemotherapy   Patient is on Treatment Plan : PANCREAS Gemcitabine D1,8 (1000) q21d x 8 Cycles     11/15/2021 Cancer Staging   Staging form: Exocrine Pancreas, AJCC 8th Edition - Pathologic stage from 11/15/2021: Stage IB (ypT2, pN0, cM0) - Signed by Truitt Merle, MD on 01/03/2022 Stage prefix: Post-therapy Total positive nodes: 0 Histologic grade (G): G3 Histologic grading system: 3 grade system Residual tumor (R): R0 - None      INTERVAL HISTORY:  [name] is here for a follow up of   pancreatic cancer He was last seen by me on 08/19/2022 He presents to the clinic accompanied by daughter. Pt had a stroke and was in the hospital in January. Pt is doing Physical therapy. Pt stated he has a good appetite. Pt state he had no problems wit radiation. Pt daughter said he bruises all time. Pt daughter said they put the pt on Asprin.   All other systems were reviewed with the patient and are negative.  MEDICAL HISTORY:  Past Medical History:  Diagnosis Date   Anxiety    Arthritis    Depression    Family  history of breast cancer    Family history of lung cancer    Headache    HLD (hyperlipidemia)    Hx of inguinal hernia repair    Hypertension    Hypothyroidism    Nausea with vomiting 09/21/2021   Overactive bladder    Pancreatic cancer (HCC)    Pneumonia    Shingles    Sleep apnea    Patient denies   Thoracic ascending aortic aneurysm ([place])    mildly dilated 4.0cm per 08/12/21 CT   Thyroid disease    Varicose veins of both lower extremities     SURGICAL HISTORY: Past Surgical History:  Procedure Laterality Date   BILATERAL CARPAL TUNNEL RELEASE     CATARACT EXTRACTION, BILATERAL Bilateral    ELBOW SURGERY Bilateral    ENDOSCOPIC RETROGRADE CHOLANGIOPANCREATOGRAPHY (ERCP) WITH PROPOFOL N/A 07/20/2021   Procedure: ENDOSCOPIC RETROGRADE CHOLANGIOPANCREATOGRAPHY (ERCP) WITH PROPOFOL;  Surgeon: Gatha Mayer, MD;  Location: [facility] ENDOSCOPY;  Service: Endoscopy;  Laterality: N/A;   ESOPHAGOGASTRODUODENOSCOPY (EGD) WITH PROPOFOL N/A 08/05/2021   Procedure: ESOPHAGOGASTRODUODENOSCOPY (EGD) WITH PROPOFOL;  Surgeon: Rush Landmark Telford Nab., MD;  Location: [place];  Service: Gastroenterology;  Laterality: N/A;   EUS N/A 08/05/2021   Procedure: UPPER ENDOSCOPIC ULTRASOUND (EUS) RADIAL;  Surgeon: Irving Copas., MD;  Location: [place];  Service: Gastroenterology;  Laterality: N/A;  EYE SURGERY     bilateral cataracts   FINE NEEDLE ASPIRATION  08/05/2021   Procedure: FINE NEEDLE ASPIRATION (FNA) LINEAR;  Surgeon: Irving Copas., MD;  Location: [place];  Service: Gastroenterology;;   INGUINAL HERNIA REPAIR Right 1951   IR ANGIO INTRA EXTRACRAN SEL COM CAROTID INNOMINATE BILAT MOD SED  02/20/2019   IR BILIARY STENT(S) EXISTING ACCESS INC DILATION CATH EXCHANGE  09/20/2021   IR ENDOLUMINAL BX OF BILIARY TREE  07/22/2021   IR GENERIC HISTORICAL  10/28/2016   IR RADIOLOGIST EVAL & MGMT 10/28/2016 [facility]-INTERV RAD   IR INT EXT BILIARY DRAIN WITH CHOLANGIOGRAM   07/22/2021   JOINT REPLACEMENT     LAPAROSCOPY N/A 11/15/2021   Procedure: LAPAROSCOPIC DIAGNOSTIC STAGING;  Surgeon: Dwan Bolt, MD;  Location: [place];  Service: General;  Laterality: N/A;   PORTACATH PLACEMENT N/A 08/19/2021   Procedure: INSERTION PORT-A-CATH;  Surgeon: Dwan Bolt, MD;  Location: [facility];  Service: General;  Laterality: N/A;  LMA   RADIOLOGY WITH ANESTHESIA N/A 10/21/2015   Procedure: RADIOLOGY WITH ANESTHESIA;  Surgeon: Luanne Bras, MD;  Location: [place];  Service: Radiology;  Laterality: N/A;   RADIOLOGY WITH ANESTHESIA N/A 02/20/2019   Procedure: [employer];  Surgeon: Luanne Bras, MD;  Location: [place];  Service: Radiology;  Laterality: N/A;   ROTATOR CUFF REPAIR Bilateral    VARICOSE VEIN SURGERY     WHIPPLE PROCEDURE N/A 11/15/2021   Procedure: WHIPPLE PROCEDURE;  Surgeon: Dwan Bolt, MD;  Location: [place];  Service: General;  Laterality: N/A;    I have reviewed the social history and family history with the patient and they are unchanged from previous note.  ALLERGIES:  has No Known Allergies.  MEDICATIONS:  Current Outpatient Medications  Medication Sig Dispense Refill   amLODipine (NORVASC) 5 MG tablet Take 1 tablet (5 mg total) by mouth daily. 30 tablet 1   aspirin EC 81 MG tablet Take 1 tablet (81 mg total) by mouth daily. Swallow whole. 30 tablet 12   atorvastatin (LIPITOR) 40 MG tablet Take 1 tablet (40 mg total) by mouth at bedtime. 30 tablet 3   Cholecalciferol (VITAMIN D3) 50 MCG (2000 UT) TABS Take 2,000 Units by mouth daily.     eltrombopag (PROMACTA) 50 MG tablet Take 1 tablet (50 mg total) by mouth daily. Take on an empty stomach 1 hour before a meal or 2 hours after 30 tablet 2   escitalopram (LEXAPRO) 20 MG tablet Take 1 tablet (20 mg total) by mouth daily. 90 tablet 1   levothyroxine (SYNTHROID) 112 MCG tablet Take 1 tablet (112 mcg total) by mouth daily before breakfast. 90 tablet 1   lidocaine-prilocaine (EMLA) cream Apply  topically to affected area(s) as needed. (Patient taking differently: Apply 1 Application topically as needed (port).) 30 g 0   lipase/protease/amylase (CREON) 36000 UNITS CPEP capsule Take 1 capsule (36,000 Units total) by mouth 3 (three) times daily before meals. (Patient taking differently: Take 72,000 Units by mouth 3 (three) times daily before meals.) 180 capsule 3   mirtazapine (REMERON) 7.5 MG tablet Take 1 tablet (7.5 mg total) by mouth at bedtime. 90 tablet 0   pantoprazole (PROTONIX) 40 MG tablet Take 1 tablet by mouth daily. 90 tablet 0   prochlorperazine (COMPAZINE) 10 MG tablet Take 1 tablet (10 mg total) by mouth every 6 (six) hours as needed (Nausea or vomiting). 30 tablet 1   vitamin B-12 (CYANOCOBALAMIN) 1000 MCG tablet Take 1,000 mcg by mouth 2 (  two) times daily.     No current facility-administered medications for this visit.    PHYSICAL EXAMINATION: ECOG PERFORMANCE STATUS: 2 - Symptomatic, <50% confined to bed  Vitals:   10/06/22 1541  BP: (!) 155/76  Pulse: 62  Resp: 17  Temp: 98.4 F (36.9 C)  SpO2: 99%   Wt Readings from Last 3 Encounters:  10/06/22 122 lb 12.8 oz (55.7 kg)  09/16/22 123 lb 7.3 oz (56 kg)  08/24/22 123 lb 9.6 oz (56.1 kg)     GENERAL:alert, no distress and comfortable SKIN: skin color normal, no rashes or significant lesions EYES: normal, Conjunctiva are pink and non-injected, sclera clear  NEURO: alert & oriented x 3 with fluent speech   LABORATORY DATA:  I have reviewed the data as listed    Latest Ref Rng & Units 10/06/2022    3:32 PM 09/16/2022   12:25 PM 09/16/2022   12:16 PM  CBC  WBC 4.0 - 10.5 K/uL 4.7   3.4   Hemoglobin 13.0 - 17.0 g/dL 9.2  10.5  9.8   Hematocrit 39.0 - 52.0 % 27.5  31.0  30.8   Platelets 150 - 400 K/uL 128   127         Latest Ref Rng & Units 09/16/2022   12:25 PM 09/16/2022   12:16 PM 09/05/2022   12:32 PM  CMP  Glucose 70 - 99 mg/dL 79  84  106   BUN 8 - 23 mg/dL 17  20  16   $ Creatinine 0.61 - 1.24  mg/dL 0.90  1.15  1.18   Sodium 135 - 145 mmol/L 140  138  138   Potassium 3.5 - 5.1 mmol/L 3.9  3.8  3.9   Chloride 98 - 111 mmol/L 103  103  105   CO2 22 - 32 mmol/L  26  30   Calcium 8.9 - 10.3 mg/dL  8.4  8.6   Total Protein 6.5 - 8.1 g/dL  6.3  5.9   Total Bilirubin 0.3 - 1.2 mg/dL  0.4  0.4   Alkaline Phos 38 - 126 U/L  65  66   AST 15 - 41 U/L  28  21   ALT 0 - 44 U/L  16  13       RADIOGRAPHIC STUDIES: I have personally reviewed the radiological images as listed and agreed with the findings in the report. No results found.    Orders Placed This Encounter  Procedures   CT CHEST ABDOMEN PELVIS W CONTRAST    Standing Status:   Future    Standing Expiration Date:   10/07/2023    Order Specific Question:   Preferred imaging location?    Answer:   [facility]    Order Specific Question:   Is Oral Contrast requested for this exam?    Answer:   Yes, Per Radiology protocol   All questions were answered. The patient knows to call the clinic with any problems, questions or concerns. No barriers to learning was detected. The total time spent in the appointment was 30 minutes.     Truitt Merle, MD 10/06/2022   Felicity Coyer, CMA, am acting as scribe for Truitt Merle, MD.   I have reviewed the above documentation for accuracy and completeness, and I agree with the above.

## 2022-10-06 NOTE — Assessment & Plan Note (Signed)
-  developed worsening thrombocytopenia in December 2023, required a platelet transfusion.  -Bone marrow seen on July 28, 2022 showed hypercellular marrow, megakaryocytes dyspoiesis, supports MDS.  Biopsy was negative for metastatic adenocarcinoma.  -cytogenetics and MDS FISH were normal  -he has responded well to Nplate

## 2022-10-07 ENCOUNTER — Inpatient Hospital Stay: Payer: Medicare HMO

## 2022-10-07 ENCOUNTER — Ambulatory Visit: Payer: Medicare HMO | Admitting: Hematology

## 2022-10-07 ENCOUNTER — Other Ambulatory Visit: Payer: Medicare HMO

## 2022-10-07 ENCOUNTER — Other Ambulatory Visit: Payer: Self-pay

## 2022-10-07 ENCOUNTER — Inpatient Hospital Stay: Payer: Medicare HMO | Admitting: Hematology

## 2022-10-07 DIAGNOSIS — I119 Hypertensive heart disease without heart failure: Secondary | ICD-10-CM | POA: Diagnosis not present

## 2022-10-07 DIAGNOSIS — I8393 Asymptomatic varicose veins of bilateral lower extremities: Secondary | ICD-10-CM | POA: Diagnosis not present

## 2022-10-07 DIAGNOSIS — D469 Myelodysplastic syndrome, unspecified: Secondary | ICD-10-CM | POA: Diagnosis not present

## 2022-10-07 DIAGNOSIS — C779 Secondary and unspecified malignant neoplasm of lymph node, unspecified: Secondary | ICD-10-CM | POA: Diagnosis not present

## 2022-10-07 DIAGNOSIS — I69354 Hemiplegia and hemiparesis following cerebral infarction affecting left non-dominant side: Secondary | ICD-10-CM | POA: Diagnosis not present

## 2022-10-07 DIAGNOSIS — C259 Malignant neoplasm of pancreas, unspecified: Secondary | ICD-10-CM | POA: Diagnosis not present

## 2022-10-07 DIAGNOSIS — I7121 Aneurysm of the ascending aorta, without rupture: Secondary | ICD-10-CM | POA: Diagnosis not present

## 2022-10-07 DIAGNOSIS — D630 Anemia in neoplastic disease: Secondary | ICD-10-CM | POA: Diagnosis not present

## 2022-10-07 DIAGNOSIS — I081 Rheumatic disorders of both mitral and tricuspid valves: Secondary | ICD-10-CM | POA: Diagnosis not present

## 2022-10-08 ENCOUNTER — Other Ambulatory Visit: Payer: Self-pay

## 2022-10-11 ENCOUNTER — Other Ambulatory Visit (HOSPITAL_COMMUNITY): Payer: Self-pay

## 2022-10-11 DIAGNOSIS — D630 Anemia in neoplastic disease: Secondary | ICD-10-CM | POA: Diagnosis not present

## 2022-10-11 DIAGNOSIS — I8393 Asymptomatic varicose veins of bilateral lower extremities: Secondary | ICD-10-CM | POA: Diagnosis not present

## 2022-10-11 DIAGNOSIS — I7121 Aneurysm of the ascending aorta, without rupture: Secondary | ICD-10-CM | POA: Diagnosis not present

## 2022-10-11 DIAGNOSIS — I69354 Hemiplegia and hemiparesis following cerebral infarction affecting left non-dominant side: Secondary | ICD-10-CM | POA: Diagnosis not present

## 2022-10-11 DIAGNOSIS — C779 Secondary and unspecified malignant neoplasm of lymph node, unspecified: Secondary | ICD-10-CM | POA: Diagnosis not present

## 2022-10-11 DIAGNOSIS — I081 Rheumatic disorders of both mitral and tricuspid valves: Secondary | ICD-10-CM | POA: Diagnosis not present

## 2022-10-11 DIAGNOSIS — C259 Malignant neoplasm of pancreas, unspecified: Secondary | ICD-10-CM | POA: Diagnosis not present

## 2022-10-11 DIAGNOSIS — I119 Hypertensive heart disease without heart failure: Secondary | ICD-10-CM | POA: Diagnosis not present

## 2022-10-11 DIAGNOSIS — D469 Myelodysplastic syndrome, unspecified: Secondary | ICD-10-CM | POA: Diagnosis not present

## 2022-10-14 ENCOUNTER — Other Ambulatory Visit (HOSPITAL_COMMUNITY): Payer: Self-pay

## 2022-10-17 ENCOUNTER — Other Ambulatory Visit: Payer: Self-pay

## 2022-10-17 ENCOUNTER — Other Ambulatory Visit (HOSPITAL_COMMUNITY): Payer: Self-pay

## 2022-10-17 DIAGNOSIS — I081 Rheumatic disorders of both mitral and tricuspid valves: Secondary | ICD-10-CM | POA: Diagnosis not present

## 2022-10-17 DIAGNOSIS — D630 Anemia in neoplastic disease: Secondary | ICD-10-CM | POA: Diagnosis not present

## 2022-10-17 DIAGNOSIS — C259 Malignant neoplasm of pancreas, unspecified: Secondary | ICD-10-CM | POA: Diagnosis not present

## 2022-10-17 DIAGNOSIS — D469 Myelodysplastic syndrome, unspecified: Secondary | ICD-10-CM | POA: Diagnosis not present

## 2022-10-17 DIAGNOSIS — C779 Secondary and unspecified malignant neoplasm of lymph node, unspecified: Secondary | ICD-10-CM | POA: Diagnosis not present

## 2022-10-17 DIAGNOSIS — I119 Hypertensive heart disease without heart failure: Secondary | ICD-10-CM | POA: Diagnosis not present

## 2022-10-17 DIAGNOSIS — I8393 Asymptomatic varicose veins of bilateral lower extremities: Secondary | ICD-10-CM | POA: Diagnosis not present

## 2022-10-17 DIAGNOSIS — I69354 Hemiplegia and hemiparesis following cerebral infarction affecting left non-dominant side: Secondary | ICD-10-CM | POA: Diagnosis not present

## 2022-10-17 DIAGNOSIS — I7121 Aneurysm of the ascending aorta, without rupture: Secondary | ICD-10-CM | POA: Diagnosis not present

## 2022-10-25 DIAGNOSIS — C779 Secondary and unspecified malignant neoplasm of lymph node, unspecified: Secondary | ICD-10-CM | POA: Diagnosis not present

## 2022-10-25 DIAGNOSIS — D469 Myelodysplastic syndrome, unspecified: Secondary | ICD-10-CM | POA: Diagnosis not present

## 2022-10-25 DIAGNOSIS — D630 Anemia in neoplastic disease: Secondary | ICD-10-CM | POA: Diagnosis not present

## 2022-10-25 DIAGNOSIS — I69354 Hemiplegia and hemiparesis following cerebral infarction affecting left non-dominant side: Secondary | ICD-10-CM | POA: Diagnosis not present

## 2022-10-25 DIAGNOSIS — C259 Malignant neoplasm of pancreas, unspecified: Secondary | ICD-10-CM | POA: Diagnosis not present

## 2022-10-25 DIAGNOSIS — I8393 Asymptomatic varicose veins of bilateral lower extremities: Secondary | ICD-10-CM | POA: Diagnosis not present

## 2022-10-25 DIAGNOSIS — I119 Hypertensive heart disease without heart failure: Secondary | ICD-10-CM | POA: Diagnosis not present

## 2022-10-25 DIAGNOSIS — I7121 Aneurysm of the ascending aorta, without rupture: Secondary | ICD-10-CM | POA: Diagnosis not present

## 2022-10-25 DIAGNOSIS — I081 Rheumatic disorders of both mitral and tricuspid valves: Secondary | ICD-10-CM | POA: Diagnosis not present

## 2022-11-01 ENCOUNTER — Encounter: Payer: Self-pay | Admitting: Nurse Practitioner

## 2022-11-01 ENCOUNTER — Ambulatory Visit (INDEPENDENT_AMBULATORY_CARE_PROVIDER_SITE_OTHER): Payer: Medicare HMO | Admitting: Nurse Practitioner

## 2022-11-01 VITALS — BP 152/6 | HR 69 | Ht 70.0 in | Wt 117.4 lb

## 2022-11-01 DIAGNOSIS — E039 Hypothyroidism, unspecified: Secondary | ICD-10-CM | POA: Diagnosis not present

## 2022-11-01 DIAGNOSIS — F439 Reaction to severe stress, unspecified: Secondary | ICD-10-CM | POA: Diagnosis not present

## 2022-11-01 DIAGNOSIS — L209 Atopic dermatitis, unspecified: Secondary | ICD-10-CM

## 2022-11-01 DIAGNOSIS — K8689 Other specified diseases of pancreas: Secondary | ICD-10-CM | POA: Diagnosis not present

## 2022-11-01 DIAGNOSIS — R634 Abnormal weight loss: Secondary | ICD-10-CM

## 2022-11-01 MED ORDER — MIRTAZAPINE 7.5 MG PO TABS: 7.5000 mg | ORAL_TABLET | Freq: Every day | ORAL | 1 refills | Status: DC

## 2022-11-01 MED ORDER — TRIAMCINOLONE ACETONIDE 0.1 % EX OINT: 1.0000 | TOPICAL_OINTMENT | Freq: Two times a day (BID) | CUTANEOUS | 2 refills | Status: DC

## 2022-11-01 NOTE — Progress Notes (Signed)
Established patient visit   Patient: Angel Keller.   DOB: [birth year]   82 y.o. Male  MRN: [id number] Visit Date: 11/01/2022   Chief Complaint  Patient presents with   Medical Management of Chronic Issues   Subjective    HPI  Follow up  -started on remeron 7.5 mg every evening  -continues to take lexapro 20 mg daily.  --daughter states this hasn't really helped  --appetite has improved. Still losing weight.  -hypertension  -slightly elevated today. Generally well controlled.    Medications: Outpatient Medications Prior to Visit  Medication Sig   aspirin EC 81 MG tablet Take 1 tablet (81 mg total) by mouth daily. Swallow whole.   atorvastatin (LIPITOR) 40 MG tablet Take 1 tablet (40 mg total) by mouth at bedtime.   Cholecalciferol (VITAMIN D3) 50 MCG (2000 UT) TABS Take 2,000 Units by mouth daily.   escitalopram (LEXAPRO) 20 MG tablet Take 1 tablet (20 mg total) by mouth daily.   levothyroxine (SYNTHROID) 112 MCG tablet Take 1 tablet (112 mcg total) by mouth daily before breakfast.   lidocaine-prilocaine (EMLA) cream Apply topically to affected area(s) as needed. (Patient taking differently: Apply 1 Application topically as needed (port).)   lipase/protease/amylase (CREON) 36000 UNITS CPEP capsule Take 1 capsule (36,000 Units total) by mouth 3 (three) times daily before meals. (Patient taking differently: Take 72,000 Units by mouth 3 (three) times daily before meals.)   pantoprazole (PROTONIX) 40 MG tablet Take 1 tablet by mouth daily.   prochlorperazine (COMPAZINE) 10 MG tablet Take 1 tablet (10 mg total) by mouth every 6 (six) hours as needed (Nausea or vomiting).   vitamin B-12 (CYANOCOBALAMIN) 1000 MCG tablet Take 1,000 mcg by mouth 2 (two) times daily.   [DISCONTINUED] amLODipine (NORVASC) 5 MG tablet Take 1 tablet (5 mg total) by mouth daily.   [DISCONTINUED] eltrombopag (PROMACTA) 50 MG tablet Take 1 tablet (50 mg total) by mouth daily. Take on an empty stomach 1 hour  before a meal or 2 hours after   [DISCONTINUED] mirtazapine (REMERON) 7.5 MG tablet Take 1 tablet (7.5 mg total) by mouth at bedtime.   No facility-administered medications prior to visit.    Review of Systems See HPI    Last CBC Lab Results  Component Value Date   WBC 8.6 11/29/2022   HGB 8.7 (L) 11/29/2022   HCT 27.6 (L) 11/29/2022   MCV 83.4 11/29/2022   MCH 26.3 11/29/2022   RDW 16.0 (H) 11/29/2022   PLT 259 11/29/2022   Last metabolic panel Lab Results  Component Value Date   GLUCOSE 176 (H) 11/29/2022   NA 133 (L) 11/29/2022   K 2.8 (L) 11/29/2022   CL 106 11/29/2022   CO2 24 11/29/2022   BUN 12 11/29/2022   CREATININE 1.46 (H) 11/29/2022   GFRNONAA 48 (L) 11/29/2022   CALCIUM 7.7 (L) 11/29/2022   PHOS 3.5 07/09/2009   PROT 5.8 (L) 11/29/2022   ALBUMIN 2.6 (L) 11/29/2022   LABGLOB 2.9 07/27/2021   AGRATIO 1.4 07/27/2021   BILITOT 0.3 11/29/2022   ALKPHOS 71 11/29/2022   AST 21 11/29/2022   ALT 11 11/29/2022   ANIONGAP 3 (L) 11/29/2022   Last lipids Lab Results  Component Value Date   CHOL 130 09/17/2022   HDL 59 09/17/2022   LDLCALC 61 09/17/2022   LDLDIRECT 149.7 11/07/2008   TRIG 49 09/17/2022   CHOLHDL 2.2 09/17/2022   Last hemoglobin A1c Lab Results  Component Value Date   HGBA1C 5.2  09/16/2022   Last thyroid functions Lab Results  Component Value Date   TSH 4.060 07/27/2021   T3TOTAL <0.1 Result repeated and verified. (L) 07/11/2009   T4TOTAL 1.1 (L) 07/11/2009       Objective     Today's Vitals   11/01/22 1536 11/01/22 1625  BP: (Abnormal) 164/62 (Abnormal) 152/6  Pulse: 69   SpO2: 96%   Weight: 117 lb 6.4 oz (53.3 kg)   Height: 5\' 10"  (1.778 m)    Body mass index is 16.85 kg/m.  BP Readings from Last 3 Encounters:  11/29/22 (Abnormal) 160/77  11/01/22 (Abnormal) 152/6  10/06/22 (Abnormal) 155/76    Wt Readings from Last 3 Encounters:  11/29/22 116 lb 13.5 oz (53 kg)  11/01/22 117 lb 6.4 oz (53.3 kg)  10/06/22 122 lb  12.8 oz (55.7 kg)    Physical Exam Vitals and nursing note reviewed.  Constitutional:      Appearance: Normal appearance. He is well-developed and underweight.  HENT:     Head: Normocephalic and atraumatic.     Nose: Nose normal.     Mouth/Throat:     Mouth: Mucous membranes are moist.     Pharynx: Oropharynx is clear.  Eyes:     Extraocular Movements: Extraocular movements intact.     Conjunctiva/sclera: Conjunctivae normal.     Pupils: Pupils are equal, round, and reactive to light.  Cardiovascular:     Rate and Rhythm: Normal rate and regular rhythm.     Pulses: Normal pulses.     Heart sounds: Normal heart sounds.  Pulmonary:     Effort: Pulmonary effort is normal.     Breath sounds: Normal breath sounds.  Abdominal:     Palpations: Abdomen is soft.  Musculoskeletal:        General: Normal range of motion.     Cervical back: Normal range of motion and neck supple.  Lymphadenopathy:     Cervical: No cervical adenopathy.  Skin:    General: Skin is warm and dry.     Capillary Refill: Capillary refill takes less than 2 seconds.  Neurological:     General: No focal deficit present.     Mental Status: He is alert and oriented to person, place, and time.  Psychiatric:        Mood and Affect: Mood normal.        Behavior: Behavior normal.        Thought Content: Thought content normal.        Judgment: Judgment normal.      Assessment & Plan    Atopic dermatitis, unspecified type Assessment & Plan: Trial triamcinolone 0.1% ointment. Use twice daily as needed.   Orders: -     Triamcinolone Acetonide; Apply 1 Application topically 2 (two) times daily.  Dispense: 80 g; Refill: 2  Situational stress Assessment & Plan: Improved. Continue remeron as prescribed and lexapro 20 mg daily. Reassess in 2 months.   Orders: -     Mirtazapine; Take 1 tablet (7.5 mg total) by mouth at bedtime.  Dispense: 90 tablet; Refill: 1  Acquired hypothyroidism Assessment & Plan: Most  recent thyroid panel stable. Continue current medication.    Abnormal weight loss Assessment & Plan: Appetite improved, however, weight loss continues. He will continue to discuss with oncologist.    Pancreatic mass Assessment & Plan: Currently being treated for pancreatic cancer. Continue regullar visits with oncology as scheduled.         Return in about 2 months (around  01/01/2023) for mood.         Carlean Jews, NP  [facility] [phone] (phone) [phone] (fax)  [facility]

## 2022-11-04 DIAGNOSIS — I081 Rheumatic disorders of both mitral and tricuspid valves: Secondary | ICD-10-CM | POA: Diagnosis not present

## 2022-11-04 DIAGNOSIS — C779 Secondary and unspecified malignant neoplasm of lymph node, unspecified: Secondary | ICD-10-CM | POA: Diagnosis not present

## 2022-11-04 DIAGNOSIS — I69354 Hemiplegia and hemiparesis following cerebral infarction affecting left non-dominant side: Secondary | ICD-10-CM | POA: Diagnosis not present

## 2022-11-04 DIAGNOSIS — D469 Myelodysplastic syndrome, unspecified: Secondary | ICD-10-CM | POA: Diagnosis not present

## 2022-11-04 DIAGNOSIS — C259 Malignant neoplasm of pancreas, unspecified: Secondary | ICD-10-CM | POA: Diagnosis not present

## 2022-11-04 DIAGNOSIS — I8393 Asymptomatic varicose veins of bilateral lower extremities: Secondary | ICD-10-CM | POA: Diagnosis not present

## 2022-11-04 DIAGNOSIS — D630 Anemia in neoplastic disease: Secondary | ICD-10-CM | POA: Diagnosis not present

## 2022-11-04 DIAGNOSIS — I7121 Aneurysm of the ascending aorta, without rupture: Secondary | ICD-10-CM | POA: Diagnosis not present

## 2022-11-04 DIAGNOSIS — I119 Hypertensive heart disease without heart failure: Secondary | ICD-10-CM | POA: Diagnosis not present

## 2022-11-09 ENCOUNTER — Other Ambulatory Visit (HOSPITAL_COMMUNITY): Payer: Self-pay

## 2022-11-11 ENCOUNTER — Other Ambulatory Visit: Payer: Self-pay | Admitting: Hematology

## 2022-11-11 ENCOUNTER — Other Ambulatory Visit (HOSPITAL_COMMUNITY): Payer: Self-pay

## 2022-11-11 ENCOUNTER — Other Ambulatory Visit: Payer: Self-pay

## 2022-11-11 MED ORDER — ELTROMBOPAG OLAMINE 50 MG PO TABS
50.0000 mg | ORAL_TABLET | Freq: Every day | ORAL | 2 refills | Status: DC
  Filled 2022-11-11: qty 30, 30d supply, fill #0
  Filled 2022-12-20 (×2): qty 30, 30d supply, fill #1
  Filled 2023-03-08: qty 30, 30d supply, fill #2

## 2022-11-14 ENCOUNTER — Other Ambulatory Visit: Payer: Self-pay

## 2022-11-17 ENCOUNTER — Other Ambulatory Visit: Payer: Self-pay | Admitting: Nurse Practitioner

## 2022-11-17 ENCOUNTER — Other Ambulatory Visit (HOSPITAL_COMMUNITY): Payer: Self-pay

## 2022-11-17 ENCOUNTER — Inpatient Hospital Stay: Payer: Medicare HMO | Attending: Physician Assistant

## 2022-11-17 DIAGNOSIS — D469 Myelodysplastic syndrome, unspecified: Secondary | ICD-10-CM | POA: Diagnosis not present

## 2022-11-17 DIAGNOSIS — R1084 Generalized abdominal pain: Secondary | ICD-10-CM | POA: Diagnosis not present

## 2022-11-17 DIAGNOSIS — R197 Diarrhea, unspecified: Secondary | ICD-10-CM | POA: Insufficient documentation

## 2022-11-17 DIAGNOSIS — E860 Dehydration: Secondary | ICD-10-CM | POA: Diagnosis not present

## 2022-11-17 DIAGNOSIS — C257 Malignant neoplasm of other parts of pancreas: Secondary | ICD-10-CM | POA: Diagnosis not present

## 2022-11-17 DIAGNOSIS — C251 Malignant neoplasm of body of pancreas: Secondary | ICD-10-CM

## 2022-11-17 DIAGNOSIS — Z95828 Presence of other vascular implants and grafts: Secondary | ICD-10-CM

## 2022-11-17 DIAGNOSIS — C250 Malignant neoplasm of head of pancreas: Secondary | ICD-10-CM

## 2022-11-17 LAB — CBC WITH DIFFERENTIAL (CANCER CENTER ONLY)
Abs Immature Granulocytes: 0.04 10*3/uL (ref 0.00–0.07)
Basophils Absolute: 0 10*3/uL (ref 0.0–0.1)
Basophils Relative: 1 %
Eosinophils Absolute: 0.1 10*3/uL (ref 0.0–0.5)
Eosinophils Relative: 2 %
HCT: 27.2 % — ABNORMAL LOW (ref 39.0–52.0)
Hemoglobin: 9 g/dL — ABNORMAL LOW (ref 13.0–17.0)
Immature Granulocytes: 1 %
Lymphocytes Relative: 16 %
Lymphs Abs: 1.1 10*3/uL (ref 0.7–4.0)
MCH: 26.7 pg (ref 26.0–34.0)
MCHC: 33.1 g/dL (ref 30.0–36.0)
MCV: 80.7 fL (ref 80.0–100.0)
Monocytes Absolute: 0.6 10*3/uL (ref 0.1–1.0)
Monocytes Relative: 9 %
Neutro Abs: 4.7 10*3/uL (ref 1.7–7.7)
Neutrophils Relative %: 71 %
Platelet Count: 273 10*3/uL (ref 150–400)
RBC: 3.37 MIL/uL — ABNORMAL LOW (ref 4.22–5.81)
RDW: 15.2 % (ref 11.5–15.5)
WBC Count: 6.6 10*3/uL (ref 4.0–10.5)
nRBC: 0 % (ref 0.0–0.2)

## 2022-11-17 LAB — CMP (CANCER CENTER ONLY)
ALT: 11 U/L (ref 0–44)
AST: 22 U/L (ref 15–41)
Albumin: 3.3 g/dL — ABNORMAL LOW (ref 3.5–5.0)
Alkaline Phosphatase: 78 U/L (ref 38–126)
Anion gap: 6 (ref 5–15)
BUN: 15 mg/dL (ref 8–23)
CO2: 28 mmol/L (ref 22–32)
Calcium: 8.9 mg/dL (ref 8.9–10.3)
Chloride: 105 mmol/L (ref 98–111)
Creatinine: 1.27 mg/dL — ABNORMAL HIGH (ref 0.61–1.24)
GFR, Estimated: 56 mL/min — ABNORMAL LOW (ref >60)
Glucose, Bld: 87 mg/dL (ref 70–99)
Potassium: 3.2 mmol/L — ABNORMAL LOW (ref 3.5–5.1)
Sodium: 139 mmol/L (ref 135–145)
Total Bilirubin: 0.7 mg/dL (ref 0.3–1.2)
Total Protein: 7 g/dL (ref 6.5–8.1)

## 2022-11-17 LAB — SAMPLE TO BLOOD BANK

## 2022-11-17 MED ORDER — SODIUM CHLORIDE 0.9% FLUSH
10.0000 mL | INTRAVENOUS | Status: DC | PRN
  Administered 2022-11-17: 10 mL

## 2022-11-17 MED ORDER — HEPARIN SOD (PORK) LOCK FLUSH 100 UNIT/ML IV SOLN
500.0000 [IU] | Freq: Once | INTRAVENOUS | Status: AC | PRN
  Administered 2022-11-17: 500 [IU]

## 2022-11-21 ENCOUNTER — Telehealth: Payer: Self-pay | Admitting: *Deleted

## 2022-11-21 NOTE — Telephone Encounter (Addendum)
-----   Message from Angel Mood, MD sent at 11/18/2022  9:59 PM EDT ----- Please let pt know his lab results, Cr slightly elevated, encourage him to drink more fluids, plt normal which is great, anemia stable, no other new concerns. Thanks  Angel Keller   Contacted patient - spoke with daughter Misty Stanley and shared results as above. She verbalized understanding and states she has been encouraging her father to drink more fluids.

## 2022-11-29 ENCOUNTER — Other Ambulatory Visit: Payer: Self-pay

## 2022-11-29 ENCOUNTER — Telehealth: Payer: Self-pay

## 2022-11-29 ENCOUNTER — Emergency Department (HOSPITAL_COMMUNITY)
Admission: EM | Admit: 2022-11-29 | Discharge: 2022-11-29 | Disposition: A | Discharge status: Home / Self Care | Payer: Medicare HMO | Attending: Emergency Medicine | Admitting: Emergency Medicine

## 2022-11-29 ENCOUNTER — Encounter (HOSPITAL_COMMUNITY): Payer: Self-pay

## 2022-11-29 DIAGNOSIS — Z79899 Other long term (current) drug therapy: Secondary | ICD-10-CM | POA: Insufficient documentation

## 2022-11-29 DIAGNOSIS — E876 Hypokalemia: Secondary | ICD-10-CM | POA: Diagnosis not present

## 2022-11-29 DIAGNOSIS — Z7982 Long term (current) use of aspirin: Secondary | ICD-10-CM | POA: Diagnosis not present

## 2022-11-29 DIAGNOSIS — E871 Hypo-osmolality and hyponatremia: Secondary | ICD-10-CM | POA: Insufficient documentation

## 2022-11-29 DIAGNOSIS — R197 Diarrhea, unspecified: Secondary | ICD-10-CM

## 2022-11-29 DIAGNOSIS — R109 Unspecified abdominal pain: Secondary | ICD-10-CM | POA: Insufficient documentation

## 2022-11-29 DIAGNOSIS — Z8507 Personal history of malignant neoplasm of pancreas: Secondary | ICD-10-CM | POA: Insufficient documentation

## 2022-11-29 DIAGNOSIS — I10 Essential (primary) hypertension: Secondary | ICD-10-CM | POA: Insufficient documentation

## 2022-11-29 DIAGNOSIS — N179 Acute kidney failure, unspecified: Secondary | ICD-10-CM

## 2022-11-29 DIAGNOSIS — E039 Hypothyroidism, unspecified: Secondary | ICD-10-CM | POA: Insufficient documentation

## 2022-11-29 DIAGNOSIS — R4182 Altered mental status, unspecified: Secondary | ICD-10-CM | POA: Diagnosis not present

## 2022-11-29 LAB — COMPREHENSIVE METABOLIC PANEL
ALT: 11 U/L (ref 0–44)
AST: 21 U/L (ref 15–41)
Albumin: 2.6 g/dL — ABNORMAL LOW (ref 3.5–5.0)
Alkaline Phosphatase: 71 U/L (ref 38–126)
Anion gap: 3 — ABNORMAL LOW (ref 5–15)
BUN: 12 mg/dL (ref 8–23)
CO2: 24 mmol/L (ref 22–32)
Calcium: 7.7 mg/dL — ABNORMAL LOW (ref 8.9–10.3)
Chloride: 106 mmol/L (ref 98–111)
Creatinine, Ser: 1.46 mg/dL — ABNORMAL HIGH (ref 0.61–1.24)
GFR, Estimated: 48 mL/min — ABNORMAL LOW (ref >60)
Glucose, Bld: 176 mg/dL — ABNORMAL HIGH (ref 70–99)
Potassium: 2.8 mmol/L — ABNORMAL LOW (ref 3.5–5.1)
Sodium: 133 mmol/L — ABNORMAL LOW (ref 135–145)
Total Bilirubin: 0.3 mg/dL (ref 0.3–1.2)
Total Protein: 5.8 g/dL — ABNORMAL LOW (ref 6.5–8.1)

## 2022-11-29 LAB — CBC WITH DIFFERENTIAL/PLATELET
Abs Immature Granulocytes: 0.05 10*3/uL (ref 0.00–0.07)
Basophils Absolute: 0 10*3/uL (ref 0.0–0.1)
Basophils Relative: 0 %
Eosinophils Absolute: 0.1 10*3/uL (ref 0.0–0.5)
Eosinophils Relative: 1 %
HCT: 27.6 % — ABNORMAL LOW (ref 39.0–52.0)
Hemoglobin: 8.7 g/dL — ABNORMAL LOW (ref 13.0–17.0)
Immature Granulocytes: 1 %
Lymphocytes Relative: 9 %
Lymphs Abs: 0.7 10*3/uL (ref 0.7–4.0)
MCH: 26.3 pg (ref 26.0–34.0)
MCHC: 31.5 g/dL (ref 30.0–36.0)
MCV: 83.4 fL (ref 80.0–100.0)
Monocytes Absolute: 0.6 10*3/uL (ref 0.1–1.0)
Monocytes Relative: 8 %
Neutro Abs: 7.1 10*3/uL (ref 1.7–7.7)
Neutrophils Relative %: 81 %
Platelets: 259 10*3/uL (ref 150–400)
RBC: 3.31 MIL/uL — ABNORMAL LOW (ref 4.22–5.81)
RDW: 16 % — ABNORMAL HIGH (ref 11.5–15.5)
WBC: 8.6 10*3/uL (ref 4.0–10.5)
nRBC: 0 % (ref 0.0–0.2)

## 2022-11-29 LAB — URINALYSIS, ROUTINE W REFLEX MICROSCOPIC
Bacteria, UA: NONE SEEN
Bilirubin Urine: NEGATIVE
Glucose, UA: NEGATIVE mg/dL
Ketones, ur: NEGATIVE mg/dL
Leukocytes,Ua: NEGATIVE
Nitrite: NEGATIVE
Protein, ur: NEGATIVE mg/dL
Specific Gravity, Urine: 1.006 (ref 1.005–1.030)
pH: 5 (ref 5.0–8.0)

## 2022-11-29 LAB — LIPASE, BLOOD: Lipase: 19 U/L (ref 11–51)

## 2022-11-29 LAB — MAGNESIUM: Magnesium: 1.9 mg/dL (ref 1.7–2.4)

## 2022-11-29 MED ORDER — POTASSIUM CHLORIDE CRYS ER 20 MEQ PO TBCR
40.0000 meq | EXTENDED_RELEASE_TABLET | Freq: Once | ORAL | Status: AC
  Administered 2022-11-29: 40 meq via ORAL
  Filled 2022-11-29: qty 2

## 2022-11-29 MED ORDER — LOPERAMIDE HCL 2 MG PO CAPS: 2.0000 mg | ORAL_CAPSULE | Freq: Four times a day (QID) | ORAL | 0 refills | Status: DC | PRN

## 2022-11-29 MED ORDER — POTASSIUM CHLORIDE ER 10 MEQ PO TBCR: 10.0000 meq | EXTENDED_RELEASE_TABLET | Freq: Every day | ORAL | 0 refills | Status: DC

## 2022-11-29 MED ORDER — POTASSIUM CHLORIDE 10 MEQ/100ML IV SOLN
10.0000 meq | Freq: Once | INTRAVENOUS | Status: AC
  Administered 2022-11-29: 10 meq via INTRAVENOUS
  Filled 2022-11-29: qty 100

## 2022-11-29 MED ORDER — LACTATED RINGERS IV BOLUS
1000.0000 mL | Freq: Once | INTRAVENOUS | Status: AC
  Administered 2022-11-29: 1000 mL via INTRAVENOUS

## 2022-11-29 MED ORDER — HEPARIN SOD (PORK) LOCK FLUSH 100 UNIT/ML IV SOLN
INTRAVENOUS | Status: AC
  Filled 2022-11-29: qty 5

## 2022-11-29 MED ORDER — LOPERAMIDE HCL 2 MG PO CAPS: 2.0000 mg | ORAL_CAPSULE | Freq: Once | ORAL | Status: DC

## 2022-11-29 NOTE — ED Triage Notes (Signed)
C/o diarrhea, lightheaded and AMS.  Per family the AMS has been going on for about 4 weeks.  Also c/o right eye redness and swelling Recently finished radiation in march.  Per family diarrhea 5-6 bowel movements/day.

## 2022-11-29 NOTE — ED Provider Notes (Signed)
[place] EMERGENCY DEPARTMENT AT [facility] Provider Note   CSN: [id number] Arrival date & time: 11/29/22  1422     History  Chief Complaint  Patient presents with   Diarrhea   Altered Mental Status    Angel Keller. is a 82 y.o. male.  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  Patient is here with his daughter and they report that he has had diarrhea for at least the last 5 to 6 days.  They state that he has been having 5-6 episodes of diarrhea per day and that he often cannot make it to the bathroom before having bowel movement.  He states that it has been watery and loose stool.  He denies any black or bloody stool.  He states he has an associated nausea and some abdominal cramping.  He denies any fevers or vomiting.  He denies any recent antibiotic use or recent hospitalizations or recent travel.  Per the triage note there is report of altered mental status however the patient and his daughter just report to me that he has been generally weak.  The history is provided by the patient and a relative.  Diarrhea Altered Mental Status      Home Medications Prior to Admission medications   Medication Sig Start Date End Date Taking? Authorizing Provider  loperamide (IMODIUM) 2 MG capsule Take 1 capsule (2 mg total) by mouth 4 (four) times daily as needed for diarrhea or loose stools. 11/29/22  Yes Theresia Lo, [place] K, DO  potassium chloride (KLOR-CON) 10 MEQ tablet Take 1 tablet (10 mEq total) by mouth daily. 11/29/22  Yes Theresia Lo, Turkey K, DO  amLODipine (NORVASC) 5 MG tablet TAKE 1 TABLET (5 MG TOTAL) BY MOUTH DAILY. 11/17/22 05/16/23  Carlean Jews, NP  aspirin EC 81 MG tablet Take 1 tablet (81 mg total)  by mouth daily. Swallow whole. 09/18/22   Osvaldo Shipper, MD  atorvastatin (LIPITOR) 40 MG tablet Take 1 tablet (40 mg total) by mouth at bedtime. 09/17/22   Osvaldo Shipper, MD  Cholecalciferol (VITAMIN D3) 50 MCG (2000 UT) TABS Take 2,000 Units by mouth daily.    [provider]  eltrombopag (PROMACTA) 50 MG tablet Take 1 tablet (50 mg total) by mouth daily. Take on an empty stomach 1 hour before a meal or 2 hours after 11/11/22   Malachy Mood, MD  escitalopram (LEXAPRO) 20 MG tablet Take 1 tablet (20 mg total) by mouth daily. 09/26/22   Carlean Jews, NP  levothyroxine (SYNTHROID) 112 MCG tablet Take 1 tablet (112 mcg total) by mouth daily before breakfast. 09/26/22   Carlean Jews, NP  lidocaine-prilocaine (EMLA) cream Apply topically to affected area(s) as needed. Patient taking differently: Apply 1 Application topically as needed (port). 08/24/22   Ronny Bacon, PA-C  lipase/protease/amylase (CREON) 36000 UNITS CPEP capsule Take 1 capsule (36,000 Units total) by mouth 3 (three) times daily before meals. Patient taking differently: Take 72,000 Units by mouth 3 (three) times daily before meals. 08/24/22   Malachy Mood, MD  mirtazapine (REMERON) 7.5 MG tablet Take 1 tablet (7.5 mg total) by mouth at bedtime. 11/01/22   Carlean Jews, NP  pantoprazole (PROTONIX) 40 MG tablet Take 1 tablet by mouth daily. 08/24/22   Malachy Mood, MD  prochlorperazine (  COMPAZINE) 10 MG tablet Take 1 tablet (10 mg total) by mouth every 6 (six) hours as needed (Nausea or vomiting). 06/09/22   Malachy Mood, MD  triamcinolone ointment (KENALOG) 0.1 % Apply 1 Application topically 2 (two) times daily. 11/01/22   Carlean Jews, NP  vitamin B-12 (CYANOCOBALAMIN) 1000 MCG tablet Take 1,000 mcg by mouth 2 (two) times daily.    [provider]      Allergies    Patient has no known allergies.    Review of Systems   Review of Systems  Gastrointestinal:  Positive for diarrhea.    Physical  Exam Updated Vital Signs BP (!) 140/73   Pulse 75   Temp 99 F (37.2 C) (Oral)   Resp 20   Wt 53 kg   SpO2 98%   BMI 16.77 kg/m  Physical Exam Vitals and nursing note reviewed.  Constitutional:      General: He is not in acute distress.    Comments: Frail and cachectic appearing  HENT:     Head: Normocephalic and atraumatic.     Nose: Nose normal.     Mouth/Throat:     Mouth: Mucous membranes are moist.     Pharynx: Oropharynx is clear.  Eyes:     Extraocular Movements: Extraocular movements intact.     Conjunctiva/sclera: Conjunctivae normal.  Cardiovascular:     Rate and Rhythm: Normal rate and regular rhythm.     Heart sounds: Normal heart sounds.  Pulmonary:     Effort: Pulmonary effort is normal.     Breath sounds: Normal breath sounds.  Abdominal:     General: Abdomen is flat.     Palpations: Abdomen is soft.     Tenderness: There is no abdominal tenderness.  Musculoskeletal:        General: Normal range of motion.     Cervical back: Normal range of motion and neck supple.  Skin:    General: Skin is warm and dry.  Neurological:     General: No focal deficit present.     Mental Status: He is alert and oriented to person, place, and time.  Psychiatric:        Mood and Affect: Mood normal.        Behavior: Behavior normal.     ED Results / Procedures / Treatments   Labs (all labs ordered are listed, but only abnormal results are displayed) Labs Reviewed  COMPREHENSIVE METABOLIC PANEL - Abnormal; Notable for the following components:      Result Value   Sodium 133 (*)    Potassium 2.8 (*)    Glucose, Bld 176 (*)    Creatinine, Ser 1.46 (*)    Calcium 7.7 (*)    Total Protein 5.8 (*)    Albumin 2.6 (*)    GFR, Estimated 48 (*)    Anion gap 3 (*)    All other components within normal limits  CBC WITH DIFFERENTIAL/PLATELET - Abnormal; Notable for the following components:   RBC 3.31 (*)    Hemoglobin 8.7 (*)    HCT 27.6 (*)    RDW 16.0 (*)    All  other components within normal limits  GASTROINTESTINAL PANEL BY PCR, STOOL (REPLACES STOOL CULTURE)  C DIFFICILE QUICK SCREEN W PCR REFLEX    MAGNESIUM  LIPASE, BLOOD  URINALYSIS, ROUTINE W REFLEX MICROSCOPIC    EKG None  Radiology No results found.  Procedures Procedures    Medications Ordered in ED Medications  potassium chloride 10  mEq in 100 mL IVPB (10 mEq Intravenous New Bag/Given 11/29/22 1716)  lactated ringers bolus 1,000 mL (1,000 mLs Intravenous New Bag/Given 11/29/22 1548)  potassium chloride SA (KLOR-CON M) CR tablet 40 mEq (40 mEq Oral Given 11/29/22 1712)    ED Course/ Medical Decision Making/ A&P Clinical Course as of 11/29/22 1728  Tue Nov 29, 2022  1642 Labs with mild increase of Cr with mild hyponatremia and hypokalemia consistent with dehydration. He will be repleted with IV and PO potassium. Urine and stool studies pending. [VK]  1725 Patient reports improvement of his symptoms and has had no further diarrhea here in the emergency department.  He states he would prefer to be discharged home and will be plan for discharge after potassium and fluid repletion.  He was recommended close primary care/oncology follow-up. [VK]    Clinical Course User Index [VK] Rexford Maus, DO                             Medical Decision Making This patient presents to the ED with chief complaint(s) of diarrhea with pertinent past medical history of pancreatic cancer s/p chemo & radiation, HTN, hypothyroidism, prior CVA which further complicates the presenting complaint. The complaint involves an extensive differential diagnosis and also carries with it a high risk of complications and morbidity.    The differential diagnosis includes dehydration, electrolyte abnormality, C. difficile or other bacterial diarrhea, viral syndrome, patient has no focal neurologic deficits on exam making CVA unlikely  Additional history obtained: Additional history obtained from  family Records reviewed outpatient oncology records  ED Course and Reassessment: On patient's arrival to the emergency department he is hemodynamically stable no acute distress.  He will have labs performed to evaluate for dehydration and electrolyte abnormality as well as stool studies should he have a bowel movement here.  He will be started on IV fluids for rehydration and will be closely reassessed.  Independent labs interpretation:  The following labs were independently interpreted: mild hyponatremia and AKI, hypokalemia consistent with dehydration, anemia at baseline  Independent visualization of imaging: N/A  Consultation: - Consulted or discussed management/test interpretation w/ external professional: N/A  Consideration for admission or further workup: Patient has no emergent conditions requiring admission or further work-up at this time and is stable for discharge home with primary care follow-up  Social Determinants of health: N/A    Amount and/or Complexity of Data Reviewed Labs: ordered.  Risk Prescription drug management.          Final Clinical Impression(s) / ED Diagnoses Final diagnoses:  Diarrhea, unspecified type  Hypokalemia  AKI (acute kidney injury)    Rx / DC Orders ED Discharge Orders          Ordered    potassium chloride (KLOR-CON) 10 MEQ tablet  Daily        11/29/22 1726    loperamide (IMODIUM) 2 MG capsule  4 times daily PRN        11/29/22 1726              Rexford Maus, DO 11/29/22 1728

## 2022-11-29 NOTE — Discharge Instructions (Signed)
You were seen in the emergency department for your diarrhea.  You did appear mildly dehydrated and your potassium level was low.  We gave you fluids and potassium through the IV and I have given you prescription for potassium supplement to take for the next several days to help prevent your potassium from getting low again from your diarrhea.  I have also given you an antidiarrheal that you can take as needed.  If you do have further episodes of diarrhea at home, you should try to collect a stool sample that you can bring to your primary doctor or your oncologist to have your stool tested.  You should follow-up within the next few days to have your symptoms and labs rechecked.  You should return to the emergency department if you are having worsening dehydration, severe abdominal pain, fevers or if you have any other new or concerning symptoms.

## 2022-11-29 NOTE — Telephone Encounter (Signed)
Pt's daughter called stating the pt is having severe diarrhea.  Pt daughter stated the pt is incontinent d/t diarrhea is so severe.  Pt's daughter stated the pt is having 5 to 6 bowel movements a day for 5 days.  Pt is not really eating or drinking.  Pt's daughter stated pt is fatigue.  Pt's daughter stated the pt has been taking Pepto Bismol but it's not working.  Pt's daughter confirmed that pt has been taking all his medications as prescribed.  Instructed pt's daughter to take the pt to the hospital for further evaluation since it's too late in the day for [facility] to see pt and pt has a hx of stroke.  Pt's daughter verbalized understanding and stated she will take the pt to the hospital for further evaluation.  Notified Dr. Mosetta Putt and Santiago Glad, NP of the pt's daughter's call.

## 2022-11-30 ENCOUNTER — Telehealth: Payer: Self-pay

## 2022-11-30 DIAGNOSIS — C250 Malignant neoplasm of head of pancreas: Secondary | ICD-10-CM

## 2022-11-30 DIAGNOSIS — C251 Malignant neoplasm of body of pancreas: Secondary | ICD-10-CM

## 2022-11-30 DIAGNOSIS — E878 Other disorders of electrolyte and fluid balance, not elsewhere classified: Secondary | ICD-10-CM

## 2022-11-30 NOTE — Telephone Encounter (Signed)
Spoke with pt's daughter regarding f/u visit post recent ED Visit.  Informed pt's daughter that pt has an appt on Friday 12/02/2022 at 9:30 am for port flush w/lab and [facility] for hydration & electrolyte replacement.  Pt's daughter stated she was very "Thankful" for everything Dr. Latanya Maudlin office does for her father.  Scheduled appts per [facility] request.

## 2022-12-02 ENCOUNTER — Telehealth: Payer: Self-pay | Admitting: Hematology

## 2022-12-02 ENCOUNTER — Inpatient Hospital Stay: Payer: Medicare HMO

## 2022-12-02 ENCOUNTER — Inpatient Hospital Stay: Payer: Medicare HMO | Admitting: Physician Assistant

## 2022-12-02 NOTE — Telephone Encounter (Signed)
Patients daughter called to let us know patient is having stomach issues and wont be at his appointments today.

## 2022-12-02 NOTE — Telephone Encounter (Signed)
Reached out to daughter to see if patient could follow up with Dr Mosetta Putt [ per her request] sometime between now and the may appointment, left voicemail.

## 2022-12-05 ENCOUNTER — Telehealth: Payer: Self-pay

## 2022-12-05 NOTE — Telephone Encounter (Signed)
        Patient  visited [facility] on 11/29/2022  for Diarrhea  Altered Mental Status.   Telephone encounter attempt :  1st  A HIPAA compliant voice message was left requesting a return call.  Instructed patient to call back at [phone].   Zahrah Sutherlin [facility]  [facility] Population Health Community Resource Care Guide   [email or web address]  ?? [phone]   Website: [email or web address]  [email or web address]

## 2022-12-05 NOTE — Telephone Encounter (Signed)
        Patient  visited [facility] on 11/29/2022  for Diarrhea  Altered Mental Status.   Telephone encounter attempt :  2nd  A HIPAA compliant voice message was left requesting a return call.  Instructed patient to call back at [phone].   Meribeth Vitug [facility]  [facility] Population Health Community Resource Care Guide   [email or web address]  ?? [phone]   Website: [email or web address]  [email or web address]

## 2022-12-06 ENCOUNTER — Telehealth: Payer: Self-pay

## 2022-12-06 ENCOUNTER — Other Ambulatory Visit: Payer: Self-pay

## 2022-12-06 NOTE — Telephone Encounter (Signed)
Called patient to schedule Medicare Annual Wellness Visit (AWV). Left message for patient to call back and schedule Medicare Annual Wellness Visit (AWV).  Last date of AWV: 08/26/21  Please schedule an appointment at any time on Annual Wellness Visit schedule.

## 2022-12-07 ENCOUNTER — Other Ambulatory Visit: Payer: Self-pay

## 2022-12-09 ENCOUNTER — Other Ambulatory Visit: Payer: Self-pay

## 2022-12-10 DIAGNOSIS — L209 Atopic dermatitis, unspecified: Secondary | ICD-10-CM | POA: Insufficient documentation

## 2022-12-10 NOTE — Assessment & Plan Note (Signed)
Currently being treated for pancreatic cancer. Continue regullar visits with oncology as scheduled.

## 2022-12-10 NOTE — Assessment & Plan Note (Signed)
Improved. Continue remeron as prescribed and lexapro 20 mg daily. Reassess in 2 months.

## 2022-12-10 NOTE — Assessment & Plan Note (Signed)
Appetite improved, however, weight loss continues. He will continue to discuss with oncologist.

## 2022-12-10 NOTE — Assessment & Plan Note (Addendum)
Most recent thyroid panel stable. Continue current medication.

## 2022-12-10 NOTE — Assessment & Plan Note (Signed)
Trial triamcinolone 0.1% ointment. Use twice daily as needed.

## 2022-12-11 NOTE — Progress Notes (Unsigned)
Patient Care Team: Angel Jews, NP as PCP - General (Family Medicine) Angel Red, Keller as PCP - Cardiology (Cardiology) Angel Mood, Keller as Consulting Physician (Oncology) Angel Lente, RN as Oncology Nurse Navigator (Oncology)   CHIEF COMPLAINT: Follow up pancreatic cancer and MDS  Oncology History Overview Note   Cancer Staging  Pancreatic cancer [facility]) Staging form: Exocrine Pancreas, AJCC 8th Edition - Clinical stage from 08/05/2021: Stage IB (cT2, cN0, cM0) - Signed by Angel Mood, Keller on 08/17/2021 Stage prefix: Initial diagnosis Total positive nodes: 0     Pancreatic mass  07/18/2021 Initial Diagnosis   Pancreatic mass   07/18/2021 Imaging   CT Abdomen Pelvis W Contrast  IMPRESSION: 1. Suspected solid mass within the proximal body of the pancreas measures 2.3 x 2.6 x 2.0 cm. There is downstream dilation of the main pancreatic duct and its branches. There is also intra and extrahepatic biliary ductal dilation. Further evaluation with MRI of the abdomen, pancreatic protocol, when clinically feasible, may be considered. 2. 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 3. Aortic atherosclerosis   07/20/2021 Procedure   DG ERCP  IMPRESSION: Nondiagnostic ERCP as above. Correlation with the operative report is advised.  - The examination was suspicious for a biliary stricture in the bile duct. - Examination was suspicious for carcinoma of the head of the pancreas. - Attempts at a cholangiogram failed.   07/22/2021 Pathology Results   CASE: [id number]   FINAL MICROSCOPIC DIAGNOSIS:  - Suspicious for malignancy  - See comment   DIAGNOSTIC COMMENTS:  There are rare cells with cytologic atypia suspicious for  adenocarcinoma.  Angel Keller agrees.    07/22/2021 Procedure   IR INT EXT BILIARY DRAIN WITH CHOLANGIOGRAM ( IMPRESSION: 1. Percutaneous transhepatic cholangiogram demonstrates complete occlusion of the distal common bile  duct. 2. Successful brush biopsy of obstructing lesion x3. 3. Successful placement of a 10 French internal/external biliary drainage catheter.   PLAN: 1. Follow bilirubin. When significant downward trend is clear, the bag should be capped. Recommend capping bag before discharge home if possible. 2. Follow-up in IR in 4-6 weeks for initial biliary tube check and exchange. If initial brush biopsies are negative, repeat biopsy could be considered at that time.   Pancreatic cancer (HCC)  08/05/2021 Cancer Staging   Staging form: Exocrine Pancreas, AJCC 8th Edition - Clinical stage from 08/05/2021: Stage IB (cT2, cN0, cM0) - Signed by Angel Mood, Keller on 08/17/2021 Stage prefix: Initial diagnosis Total positive nodes: 0   08/14/2021 Initial Diagnosis   Pancreatic cancer (HCC)   08/20/2021 - 03/23/2022 Chemotherapy   Patient is on Treatment Plan : PANCREATIC Abraxane / Gemcitabine D1,8 q21d     08/20/2021 -  Chemotherapy   Patient is on Treatment Plan : PANCREAS Gemcitabine D1,8 (1000) q21d x 8 Cycles     11/15/2021 Cancer Staging   Staging form: Exocrine Pancreas, AJCC 8th Edition - Pathologic stage from 11/15/2021: Stage IB (ypT2, pN0, cM0) - Signed by Angel Mood, Keller on 01/03/2022 Stage prefix: Post-therapy Total positive nodes: 0 Histologic grade (G): G3 Histologic grading system: 3 grade system Residual tumor (R): R0 - None      CURRENT THERAPY:  Pancreatic cancer: s/p neoadjuvant chemotherapy, whipple 11/15/21, adjuvant chemo 06/18/22, and SBRT to oligo metastatic disease completed 09/30/22. Now on observation  MDS: Promacta, platelet range 100 - 200  INTERVAL HISTORY Angel Keller returns for follow up. Last seen by Angel Keller 10/06/22 with plans for 3 mont surveillance visit.  His daughter called to let us know he developed severe diarrhea and incontinence 5-6 episodes daily x5 days on 4/16. Medication at home was not working, he came to ED. He was repleated with IVF and po potassium, not admitted.    ROS   Past Medical History:  Diagnosis Date   Anxiety    Arthritis    Depression    Family history of breast cancer    Family history of lung cancer    Headache    HLD (hyperlipidemia)    Hx of inguinal hernia repair    Hypertension    Hypothyroidism    Nausea with vomiting 09/21/2021   Overactive bladder    Pancreatic cancer (HCC)    Pneumonia    Shingles    Sleep apnea    Patient denies   Thoracic ascending aortic aneurysm (HCC)    mildly dilated 4.0cm per 08/12/21 CT   Thyroid disease    Varicose veins of both lower extremities      Past Surgical History:  Procedure Laterality Date   BILATERAL CARPAL TUNNEL RELEASE     CATARACT EXTRACTION, BILATERAL Bilateral    ELBOW SURGERY Bilateral    ENDOSCOPIC RETROGRADE CHOLANGIOPANCREATOGRAPHY (ERCP) WITH PROPOFOL N/A 07/20/2021   Procedure: ENDOSCOPIC RETROGRADE CHOLANGIOPANCREATOGRAPHY (ERCP) WITH PROPOFOL;  Surgeon: Angel Keller;  Location: [facility] ENDOSCOPY;  Service: Endoscopy;  Laterality: N/A;   ESOPHAGOGASTRODUODENOSCOPY (EGD) WITH PROPOFOL N/A 08/05/2021   Procedure: ESOPHAGOGASTRODUODENOSCOPY (EGD) WITH PROPOFOL;  Surgeon: Angel Score Netty Starring., Keller;  Location: [facility] ENDOSCOPY;  Service: Gastroenterology;  Laterality: N/A;   EUS N/A 08/05/2021   Procedure: UPPER ENDOSCOPIC ULTRASOUND (EUS) RADIAL;  Surgeon: Angel Keller;  Location: [facility] ENDOSCOPY;  Service: Gastroenterology;  Laterality: N/A;   EYE SURGERY     bilateral cataracts   FINE NEEDLE ASPIRATION  08/05/2021   Procedure: FINE NEEDLE ASPIRATION (FNA) LINEAR;  Surgeon: Angel Keller;  Location: [facility] ENDOSCOPY;  Service: Gastroenterology;;   INGUINAL HERNIA REPAIR Right 1951   IR ANGIO INTRA EXTRACRAN SEL COM CAROTID INNOMINATE BILAT MOD SED  02/20/2019   IR BILIARY STENT(S) EXISTING ACCESS INC DILATION CATH EXCHANGE  09/20/2021   IR ENDOLUMINAL BX OF BILIARY TREE  07/22/2021   IR GENERIC HISTORICAL  10/28/2016   IR RADIOLOGIST EVAL  & MGMT 10/28/2016 [facility]-INTERV RAD   IR INT EXT BILIARY DRAIN WITH CHOLANGIOGRAM  07/22/2021   JOINT REPLACEMENT     LAPAROSCOPY N/A 11/15/2021   Procedure: LAPAROSCOPIC DIAGNOSTIC STAGING;  Surgeon: Fritzi Mandes, Keller;  Location: [facility] OR;  Service: General;  Laterality: N/A;   PORTACATH PLACEMENT N/A 08/19/2021   Procedure: INSERTION PORT-A-CATH;  Surgeon: Fritzi Mandes, Keller;  Location: [facility] ORS;  Service: General;  Laterality: N/A;  LMA   RADIOLOGY WITH ANESTHESIA N/A 10/21/2015   Procedure: RADIOLOGY WITH ANESTHESIA;  Surgeon: Julieanne Cotton, Keller;  Location: [facility] OR;  Service: Radiology;  Laterality: N/A;   RADIOLOGY WITH ANESTHESIA N/A 02/20/2019   Procedure: Sharman Crate;  Surgeon: Julieanne Cotton, Keller;  Location: [facility] OR;  Service: Radiology;  Laterality: N/A;   ROTATOR CUFF REPAIR Bilateral    VARICOSE VEIN SURGERY     WHIPPLE PROCEDURE N/A 11/15/2021   Procedure: WHIPPLE PROCEDURE;  Surgeon: Fritzi Mandes, Keller;  Location: [facility] OR;  Service: General;  Laterality: N/A;     Outpatient Encounter Medications as of 12/12/2022  Medication Sig   amLODipine (NORVASC) 5 MG tablet TAKE 1 TABLET (5 MG TOTAL) BY MOUTH DAILY.   aspirin EC 81 MG  tablet Take 1 tablet (81 mg total) by mouth daily. Swallow whole.   atorvastatin (LIPITOR) 40 MG tablet Take 1 tablet (40 mg total) by mouth at bedtime.   Cholecalciferol (VITAMIN D3) 50 MCG (2000 UT) TABS Take 2,000 Units by mouth daily.   eltrombopag (PROMACTA) 50 MG tablet Take 1 tablet (50 mg total) by mouth daily. Take on an empty stomach 1 hour before a meal or 2 hours after   escitalopram (LEXAPRO) 20 MG tablet Take 1 tablet (20 mg total) by mouth daily.   levothyroxine (SYNTHROID) 112 MCG tablet Take 1 tablet (112 mcg total) by mouth daily before breakfast.   lidocaine-prilocaine (EMLA) cream Apply topically to affected area(s) as needed. (Patient taking differently: Apply 1 Application topically as needed (port).)   lipase/protease/amylase (CREON) 36000 UNITS  CPEP capsule Take 1 capsule (36,000 Units total) by mouth 3 (three) times daily before meals. (Patient taking differently: Take 72,000 Units by mouth 3 (three) times daily before meals.)   loperamide (IMODIUM) 2 MG capsule Take 1 capsule (2 mg total) by mouth 4 (four) times daily as needed for diarrhea or loose stools.   mirtazapine (REMERON) 7.5 MG tablet Take 1 tablet (7.5 mg total) by mouth at bedtime.   pantoprazole (PROTONIX) 40 MG tablet Take 1 tablet by mouth daily.   potassium chloride (KLOR-CON) 10 MEQ tablet Take 1 tablet (10 mEq total) by mouth daily.   prochlorperazine (COMPAZINE) 10 MG tablet Take 1 tablet (10 mg total) by mouth every 6 (six) hours as needed (Nausea or vomiting).   triamcinolone ointment (KENALOG) 0.1 % Apply 1 Application topically 2 (two) times daily.   vitamin B-12 (CYANOCOBALAMIN) 1000 MCG tablet Take 1,000 mcg by mouth 2 (two) times daily.   No facility-administered encounter medications on file as of 12/12/2022.     There were no vitals filed for this visit. There is no height or weight on file to calculate BMI.   PHYSICAL EXAM GENERAL:alert, no distress and comfortable SKIN: no rash  EYES: sclera clear NECK: without mass LYMPH:  no palpable cervical or supraclavicular lymphadenopathy  LUNGS: clear with normal breathing effort HEART: regular rate & rhythm, no lower extremity edema ABDOMEN: abdomen soft, non-tender and normal bowel sounds NEURO: alert & oriented x 3 with fluent speech, no focal motor/sensory deficits Breast exam:  PAC without erythema    CBC    Component Value Date/Time   WBC 8.6 11/29/2022 1522   RBC 3.31 (L) 11/29/2022 1522   HGB 8.7 (L) 11/29/2022 1522   HGB 9.0 (L) 11/17/2022 1423   HGB 13.2 07/27/2021 1627   HCT 27.6 (L) 11/29/2022 1522   HCT 39.8 07/27/2021 1627   PLT 259 11/29/2022 1522   PLT 273 11/17/2022 1423   PLT 356 07/27/2021 1627   MCV 83.4 11/29/2022 1522   MCV 82 07/27/2021 1627   MCH 26.3 11/29/2022 1522    MCHC 31.5 11/29/2022 1522   RDW 16.0 (H) 11/29/2022 1522   RDW 16.5 (H) 07/27/2021 1627   LYMPHSABS 0.7 11/29/2022 1522   MONOABS 0.6 11/29/2022 1522   EOSABS 0.1 11/29/2022 1522   BASOSABS 0.0 11/29/2022 1522     CMP     Component Value Date/Time   NA 133 (L) 11/29/2022 1522   NA 135 07/27/2021 1627   K 2.8 (L) 11/29/2022 1522   CL 106 11/29/2022 1522   CO2 24 11/29/2022 1522   GLUCOSE 176 (H) 11/29/2022 1522   BUN 12 11/29/2022 1522   BUN 21 07/27/2021  1627   CREATININE 1.46 (H) 11/29/2022 1522   CREATININE 1.27 (H) 11/17/2022 1423   CALCIUM 7.7 (L) 11/29/2022 1522   PROT 5.8 (L) 11/29/2022 1522   PROT 7.0 07/27/2021 1627   ALBUMIN 2.6 (L) 11/29/2022 1522   ALBUMIN 4.1 07/27/2021 1627   AST 21 11/29/2022 1522   AST 22 11/17/2022 1423   ALT 11 11/29/2022 1522   ALT 11 11/17/2022 1423   ALKPHOS 71 11/29/2022 1522   BILITOT 0.3 11/29/2022 1522   BILITOT 0.7 11/17/2022 1423   GFRNONAA 48 (L) 11/29/2022 1522   GFRNONAA 56 (L) 11/17/2022 1423   GFRAA 55 (L) 02/20/2019 0608     ASSESSMENT & PLAN:Angel Keller. is a 82 y.o. male with    MDS (myelodysplastic syndrome)  -developed worsening thrombocytopenia in December 2023, s/p platelet transfusion.  -Bone marrow biopsy 07/28/22 showed hypercellular marrow and megakaryocytes dyspoiesis, which supports MDS.  Biopsy was negative for metastatic adenocarcinoma.  -cytogenetics and MDS FISH were normal  -he has responded well to Promacta -Due to his recent stroke, will keep Promacta at low-dose, and keep platelet count in 100-200 range   Pancreatic cancer, cT2N0M0, stage IB, ypT2N0  -diagnosed in 07/2021, s/p neoadjuvant chemo gemcitabine alone on 08/20/21. Abraxane was added with cycle 2 (09/17/21), but he tolerated poorly and discontinued after first dose.  -s/p whipple 11/15/21 with Dr. Freida Busman. 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 Margins and lymph nodes negative. -s/p  adjuvant gemcitabine 01/20/22 - 06/18/2022 -restaging CT from 08/17/22 showed small soft tissue nodule or lymph node interposed between the anterior aorta, portal vein, left renal vein, and superior mesenteric artery measuring 1.3 x 0.8 cm. This is new or at least significantly increased compared to prior CT 05/03/2022 and suspicious for local recurrence or nodal metastasis -unfortunately biopsy is not feasible due to location  -S/p SBRT, completed 09/30/2022. Repeat CT ~01/02/23   Recent stroke  -Recovering well, with minimal residual weakness, he will continue home PT    PLAN:  No orders of the defined types were placed in this encounter.     All questions were answered. The patient knows to call the clinic with any problems, questions or concerns. No barriers to learning were detected. I spent *** counseling the patient face to face. The total time spent in the appointment was *** and more than 50% was on counseling, review of test results, and coordination of care.   Santiago Glad, NP-C @DATE @

## 2022-12-12 ENCOUNTER — Telehealth: Payer: Self-pay

## 2022-12-12 ENCOUNTER — Inpatient Hospital Stay: Payer: Medicare HMO

## 2022-12-12 ENCOUNTER — Inpatient Hospital Stay (HOSPITAL_BASED_OUTPATIENT_CLINIC_OR_DEPARTMENT_OTHER): Payer: Medicare HMO | Admitting: Nurse Practitioner

## 2022-12-12 ENCOUNTER — Encounter: Payer: Self-pay | Admitting: Nurse Practitioner

## 2022-12-12 VITALS — BP 147/79 | HR 76 | Temp 97.7°F | Resp 17 | Wt 111.5 lb

## 2022-12-12 VITALS — BP 160/82 | HR 66 | Resp 16

## 2022-12-12 DIAGNOSIS — R197 Diarrhea, unspecified: Secondary | ICD-10-CM

## 2022-12-12 DIAGNOSIS — E860 Dehydration: Secondary | ICD-10-CM

## 2022-12-12 DIAGNOSIS — R1084 Generalized abdominal pain: Secondary | ICD-10-CM | POA: Diagnosis not present

## 2022-12-12 DIAGNOSIS — C257 Malignant neoplasm of other parts of pancreas: Secondary | ICD-10-CM | POA: Diagnosis not present

## 2022-12-12 DIAGNOSIS — D469 Myelodysplastic syndrome, unspecified: Secondary | ICD-10-CM | POA: Diagnosis not present

## 2022-12-12 DIAGNOSIS — C250 Malignant neoplasm of head of pancreas: Secondary | ICD-10-CM

## 2022-12-12 DIAGNOSIS — A498 Other bacterial infections of unspecified site: Secondary | ICD-10-CM | POA: Diagnosis not present

## 2022-12-12 DIAGNOSIS — Z95828 Presence of other vascular implants and grafts: Secondary | ICD-10-CM

## 2022-12-12 DIAGNOSIS — E878 Other disorders of electrolyte and fluid balance, not elsewhere classified: Secondary | ICD-10-CM

## 2022-12-12 DIAGNOSIS — C251 Malignant neoplasm of body of pancreas: Secondary | ICD-10-CM

## 2022-12-12 LAB — CBC WITH DIFFERENTIAL (CANCER CENTER ONLY)
Abs Immature Granulocytes: 0.03 10*3/uL (ref 0.00–0.07)
Basophils Absolute: 0 10*3/uL (ref 0.0–0.1)
Basophils Relative: 0 %
Eosinophils Absolute: 0 10*3/uL (ref 0.0–0.5)
Eosinophils Relative: 0 %
HCT: 32 % — ABNORMAL LOW (ref 39.0–52.0)
Hemoglobin: 10.7 g/dL — ABNORMAL LOW (ref 13.0–17.0)
Immature Granulocytes: 0 %
Lymphocytes Relative: 9 %
Lymphs Abs: 0.8 10*3/uL (ref 0.7–4.0)
MCH: 26.9 pg (ref 26.0–34.0)
MCHC: 33.4 g/dL (ref 30.0–36.0)
MCV: 80.4 fL (ref 80.0–100.0)
Monocytes Absolute: 0.5 10*3/uL (ref 0.1–1.0)
Monocytes Relative: 7 %
Neutro Abs: 6.7 10*3/uL (ref 1.7–7.7)
Neutrophils Relative %: 84 %
Platelet Count: 441 10*3/uL — ABNORMAL HIGH (ref 150–400)
RBC: 3.98 MIL/uL — ABNORMAL LOW (ref 4.22–5.81)
RDW: 16.6 % — ABNORMAL HIGH (ref 11.5–15.5)
WBC Count: 8.1 10*3/uL (ref 4.0–10.5)
nRBC: 0 % (ref 0.0–0.2)

## 2022-12-12 LAB — CMP (CANCER CENTER ONLY)
ALT: 11 U/L (ref 0–44)
AST: 29 U/L (ref 15–41)
Albumin: 3.1 g/dL — ABNORMAL LOW (ref 3.5–5.0)
Alkaline Phosphatase: 76 U/L (ref 38–126)
Anion gap: 9 (ref 5–15)
BUN: 12 mg/dL (ref 8–23)
CO2: 29 mmol/L (ref 22–32)
Calcium: 8.3 mg/dL — ABNORMAL LOW (ref 8.9–10.3)
Chloride: 104 mmol/L (ref 98–111)
Creatinine: 1.56 mg/dL — ABNORMAL HIGH (ref 0.61–1.24)
GFR, Estimated: 44 mL/min — ABNORMAL LOW (ref >60)
Glucose, Bld: 93 mg/dL (ref 70–99)
Potassium: 2.7 mmol/L — CL (ref 3.5–5.1)
Sodium: 142 mmol/L (ref 135–145)
Total Bilirubin: 1.2 mg/dL (ref 0.3–1.2)
Total Protein: 6 g/dL — ABNORMAL LOW (ref 6.5–8.1)

## 2022-12-12 LAB — C DIFFICILE QUICK SCREEN W PCR REFLEX
C Diff antigen: POSITIVE — AB
C Diff interpretation: DETECTED
C Diff toxin: POSITIVE — AB

## 2022-12-12 LAB — MAGNESIUM: Magnesium: 2.1 mg/dL (ref 1.7–2.4)

## 2022-12-12 LAB — SAMPLE TO BLOOD BANK

## 2022-12-12 MED ORDER — SODIUM CHLORIDE 0.9% FLUSH
10.0000 mL | INTRAVENOUS | Status: DC | PRN
  Administered 2022-12-12: 10 mL

## 2022-12-12 MED ORDER — HEPARIN SOD (PORK) LOCK FLUSH 100 UNIT/ML IV SOLN
500.0000 [IU] | Freq: Once | INTRAVENOUS | Status: AC | PRN
  Administered 2022-12-12: 500 [IU]

## 2022-12-12 MED ORDER — POTASSIUM CHLORIDE 10 MEQ/100ML IV SOLN
10.0000 meq | INTRAVENOUS | Status: AC
  Administered 2022-12-12 (×2): 10 meq via INTRAVENOUS
  Filled 2022-12-12 (×2): qty 100

## 2022-12-12 MED ORDER — SODIUM CHLORIDE 0.9 % IV SOLN: Freq: Once | INTRAVENOUS | Status: AC

## 2022-12-12 MED ORDER — POTASSIUM CHLORIDE CRYS ER 20 MEQ PO TBCR: 20.0000 meq | EXTENDED_RELEASE_TABLET | Freq: Two times a day (BID) | ORAL | 0 refills | Status: DC

## 2022-12-12 MED ORDER — HEPARIN SOD (PORK) LOCK FLUSH 100 UNIT/ML IV SOLN: 500.0000 [IU] | Freq: Once | INTRAVENOUS | Status: DC | PRN

## 2022-12-12 NOTE — Patient Instructions (Signed)

## 2022-12-12 NOTE — Progress Notes (Signed)
Patient unable to provide stool sample while in [facility]. Patient given hat and labeled collection cup to provide stool sample to drop off at the lab when he is able. Lab staff aware, patient verbalized understanding.

## 2022-12-12 NOTE — Telephone Encounter (Signed)
CRITICAL VALUE STICKER  CRITICAL VALUE:  POTASSIUM 2.7  RECEIVER (on-site recipient of call): Alva Broxson P. LPN  DATE & TIME NOTIFIED: 12/12/2022 1222 P  MESSENGER (representative from lab): Mindi Junker  MD NOTIFIED: Santiago Glad, NP  TIME OF NOTIFICATION: 1224 P

## 2022-12-12 NOTE — Patient Instructions (Addendum)
We are given you a liter of fluids and IV potassium today in clinic. We are also checking stool for infection.   I have asked your PCP to contact you about adjustments to your anxiety regimen (lexapro, remeron)  The CT is scheduled on 5/2 at 9am at [facility]. Nothing to eat or drink after midnight the night before. Will do scan at 11am after drinking contrast as 9am.   At home: Please start taking oral potassium 1 tablet twice a day Drink more water or electrolyte replacement drink Hold promacta until we tell you to restart, ~1-2 weeks or so but we will let you know If stool panel is negative, you can take imodium to stop the diarrhea

## 2022-12-13 ENCOUNTER — Encounter: Payer: Self-pay | Admitting: Hematology

## 2022-12-13 ENCOUNTER — Telehealth: Payer: Self-pay

## 2022-12-13 LAB — GASTROINTESTINAL PANEL BY PCR, STOOL (REPLACES STOOL CULTURE)

## 2022-12-13 MED ORDER — VANCOMYCIN HCL 125 MG PO CAPS: 125.0000 mg | ORAL_CAPSULE | Freq: Four times a day (QID) | ORAL | 0 refills | Status: AC

## 2022-12-13 NOTE — Telephone Encounter (Signed)
Called patients daughter and relayed message below as per Santiago Glad NP. She understood and will pick up meds for her dad when they are ready.   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 I called in to [facility]. avoid taking imodium, this will delay clearance of the infection. and please call [facility] to see what their policy is for imaging these cdif + patients. now that we know what is causing diarrhea, it's ok to wait on the scan. Thanks  Santiago Glad NP

## 2022-12-13 NOTE — Telephone Encounter (Signed)
Notified Patient's daughter by voicemail of prior authorization approval for Vancomycin 125mg  Capsules. Medication is approved through 08/15/2023.

## 2022-12-13 NOTE — Addendum Note (Signed)
Addended by: Pollyann Samples on: 12/13/2022 09:52 AM   Modules accepted: Orders

## 2022-12-14 ENCOUNTER — Other Ambulatory Visit: Payer: Self-pay

## 2022-12-15 ENCOUNTER — Inpatient Hospital Stay: Payer: Medicare HMO | Attending: Physician Assistant

## 2022-12-15 ENCOUNTER — Ambulatory Visit (HOSPITAL_BASED_OUTPATIENT_CLINIC_OR_DEPARTMENT_OTHER)
Admission: RE | Admit: 2022-12-15 | Discharge: 2022-12-15 | Disposition: A | Discharge status: Home / Self Care | Payer: Medicare HMO | Source: Ambulatory Visit | Attending: Hematology | Admitting: Hematology

## 2022-12-15 ENCOUNTER — Other Ambulatory Visit: Payer: Self-pay | Admitting: Hematology

## 2022-12-15 ENCOUNTER — Inpatient Hospital Stay: Payer: Medicare HMO

## 2022-12-15 DIAGNOSIS — J90 Pleural effusion, not elsewhere classified: Secondary | ICD-10-CM | POA: Diagnosis not present

## 2022-12-15 DIAGNOSIS — D696 Thrombocytopenia, unspecified: Secondary | ICD-10-CM | POA: Diagnosis not present

## 2022-12-15 DIAGNOSIS — R197 Diarrhea, unspecified: Secondary | ICD-10-CM | POA: Diagnosis not present

## 2022-12-15 DIAGNOSIS — A0472 Enterocolitis due to Clostridium difficile, not specified as recurrent: Secondary | ICD-10-CM | POA: Diagnosis not present

## 2022-12-15 DIAGNOSIS — C251 Malignant neoplasm of body of pancreas: Secondary | ICD-10-CM

## 2022-12-15 DIAGNOSIS — C250 Malignant neoplasm of head of pancreas: Secondary | ICD-10-CM

## 2022-12-15 DIAGNOSIS — D469 Myelodysplastic syndrome, unspecified: Secondary | ICD-10-CM

## 2022-12-15 DIAGNOSIS — C259 Malignant neoplasm of pancreas, unspecified: Secondary | ICD-10-CM | POA: Diagnosis not present

## 2022-12-15 DIAGNOSIS — K529 Noninfective gastroenteritis and colitis, unspecified: Secondary | ICD-10-CM | POA: Diagnosis not present

## 2022-12-15 LAB — CMP (CANCER CENTER ONLY)
ALT: 14 U/L (ref 0–44)
AST: 33 U/L (ref 15–41)
Albumin: 2.9 g/dL — ABNORMAL LOW (ref 3.5–5.0)
Alkaline Phosphatase: 71 U/L (ref 38–126)
Anion gap: 6 (ref 5–15)
BUN: 13 mg/dL (ref 8–23)
CO2: 29 mmol/L (ref 22–32)
Calcium: 8.2 mg/dL — ABNORMAL LOW (ref 8.9–10.3)
Chloride: 105 mmol/L (ref 98–111)
Creatinine: 1.44 mg/dL — ABNORMAL HIGH (ref 0.61–1.24)
GFR, Estimated: 49 mL/min — ABNORMAL LOW (ref >60)
Glucose, Bld: 89 mg/dL (ref 70–99)
Potassium: 3.1 mmol/L — ABNORMAL LOW (ref 3.5–5.1)
Sodium: 140 mmol/L (ref 135–145)
Total Bilirubin: 1.4 mg/dL — ABNORMAL HIGH (ref 0.3–1.2)
Total Protein: 5.8 g/dL — ABNORMAL LOW (ref 6.5–8.1)

## 2022-12-15 MED ORDER — HEPARIN SOD (PORK) LOCK FLUSH 100 UNIT/ML IV SOLN
500.0000 [IU] | Freq: Once | INTRAVENOUS | Status: AC
  Administered 2022-12-15: 500 [IU] via INTRAVENOUS

## 2022-12-16 ENCOUNTER — Telehealth: Payer: Self-pay

## 2022-12-16 NOTE — Telephone Encounter (Signed)
LVM for Misty Stanley (pt's daughter) stating that Dr. Mosetta Putt reviewed the pt's labs yesterday and the pt's K+ was 3.1 which is low.  Dr. Mosetta Putt wants to confirm that the pt is taking his oral K+ BID as prescribed by Santiago Glad, NP on 12/12/2022.  If pt is not, please have the pt to start taking daily.  Dr. Mosetta Putt will continue to monitor the pt's labs. Instructed Lisa to contact Dr. Latanya Maudlin office if they have additional questions or concerns.

## 2022-12-20 ENCOUNTER — Telehealth: Payer: Self-pay | Admitting: *Deleted

## 2022-12-20 ENCOUNTER — Inpatient Hospital Stay (HOSPITAL_BASED_OUTPATIENT_CLINIC_OR_DEPARTMENT_OTHER): Payer: Medicare HMO | Admitting: Hematology

## 2022-12-20 ENCOUNTER — Encounter: Payer: Self-pay | Admitting: Hematology

## 2022-12-20 ENCOUNTER — Other Ambulatory Visit: Payer: Self-pay | Admitting: *Deleted

## 2022-12-20 ENCOUNTER — Other Ambulatory Visit: Payer: Self-pay

## 2022-12-20 ENCOUNTER — Other Ambulatory Visit (HOSPITAL_COMMUNITY): Payer: Self-pay

## 2022-12-20 ENCOUNTER — Telehealth: Payer: Self-pay | Admitting: Hematology

## 2022-12-20 ENCOUNTER — Encounter: Payer: Self-pay | Admitting: *Deleted

## 2022-12-20 ENCOUNTER — Inpatient Hospital Stay: Payer: Medicare HMO

## 2022-12-20 VITALS — BP 139/77 | HR 69 | Temp 98.2°F | Resp 16 | Wt 116.5 lb

## 2022-12-20 DIAGNOSIS — A0472 Enterocolitis due to Clostridium difficile, not specified as recurrent: Secondary | ICD-10-CM | POA: Diagnosis not present

## 2022-12-20 DIAGNOSIS — C251 Malignant neoplasm of body of pancreas: Secondary | ICD-10-CM

## 2022-12-20 DIAGNOSIS — D469 Myelodysplastic syndrome, unspecified: Secondary | ICD-10-CM

## 2022-12-20 DIAGNOSIS — C250 Malignant neoplasm of head of pancreas: Secondary | ICD-10-CM

## 2022-12-20 DIAGNOSIS — F439 Reaction to severe stress, unspecified: Secondary | ICD-10-CM | POA: Diagnosis not present

## 2022-12-20 DIAGNOSIS — E878 Other disorders of electrolyte and fluid balance, not elsewhere classified: Secondary | ICD-10-CM

## 2022-12-20 DIAGNOSIS — D696 Thrombocytopenia, unspecified: Secondary | ICD-10-CM | POA: Diagnosis not present

## 2022-12-20 DIAGNOSIS — R197 Diarrhea, unspecified: Secondary | ICD-10-CM | POA: Diagnosis not present

## 2022-12-20 LAB — CMP (CANCER CENTER ONLY)
ALT: 29 U/L (ref 0–44)
AST: 76 U/L — ABNORMAL HIGH (ref 15–41)
Albumin: 2.6 g/dL — ABNORMAL LOW (ref 3.5–5.0)
Alkaline Phosphatase: 83 U/L (ref 38–126)
Anion gap: 5 (ref 5–15)
BUN: 14 mg/dL (ref 8–23)
CO2: 26 mmol/L (ref 22–32)
Calcium: 8.3 mg/dL — ABNORMAL LOW (ref 8.9–10.3)
Chloride: 104 mmol/L (ref 98–111)
Creatinine: 1.49 mg/dL — ABNORMAL HIGH (ref 0.61–1.24)
GFR, Estimated: 47 mL/min — ABNORMAL LOW (ref >60)
Glucose, Bld: 98 mg/dL (ref 70–99)
Potassium: 5.8 mmol/L — ABNORMAL HIGH (ref 3.5–5.1)
Sodium: 135 mmol/L (ref 135–145)
Total Bilirubin: 0.4 mg/dL (ref 0.3–1.2)
Total Protein: 6 g/dL — ABNORMAL LOW (ref 6.5–8.1)

## 2022-12-20 LAB — CBC WITH DIFFERENTIAL (CANCER CENTER ONLY)
Abs Immature Granulocytes: 0.01 10*3/uL (ref 0.00–0.07)
Basophils Absolute: 0 10*3/uL (ref 0.0–0.1)
Basophils Relative: 0 %
Eosinophils Absolute: 0.1 10*3/uL (ref 0.0–0.5)
Eosinophils Relative: 1 %
HCT: 29.8 % — ABNORMAL LOW (ref 39.0–52.0)
Hemoglobin: 9.9 g/dL — ABNORMAL LOW (ref 13.0–17.0)
Immature Granulocytes: 0 %
Lymphocytes Relative: 21 %
Lymphs Abs: 1.4 10*3/uL (ref 0.7–4.0)
MCH: 27.7 pg (ref 26.0–34.0)
MCHC: 33.2 g/dL (ref 30.0–36.0)
MCV: 83.2 fL (ref 80.0–100.0)
Monocytes Absolute: 0.5 10*3/uL (ref 0.1–1.0)
Monocytes Relative: 8 %
Neutro Abs: 4.4 10*3/uL (ref 1.7–7.7)
Neutrophils Relative %: 70 %
Platelet Count: 269 10*3/uL (ref 150–400)
RBC: 3.58 MIL/uL — ABNORMAL LOW (ref 4.22–5.81)
RDW: 17.5 % — ABNORMAL HIGH (ref 11.5–15.5)
WBC Count: 6.3 10*3/uL (ref 4.0–10.5)
nRBC: 0 % (ref 0.0–0.2)

## 2022-12-20 MED ORDER — MEGESTROL ACETATE 625 MG/5ML PO SUSP: 625.0000 mg | Freq: Every day | ORAL | 0 refills | Status: DC

## 2022-12-20 NOTE — Assessment & Plan Note (Signed)
-  Secondary to MDS -On Promacta, will give a platelet count in 100-200 range due to his recent stroke

## 2022-12-20 NOTE — Assessment & Plan Note (Signed)
-  Patient presented with diarrhea and acute renal failure last months -Stool test on December 12, 2022 was positive for C. difficile, he started oral vancomycin.

## 2022-12-20 NOTE — Assessment & Plan Note (Signed)
developed worsening thrombocytopenia in December 2023, s/p platelet transfusion.  -Bone marrow biopsy 07/28/22 showed hypercellular marrow and megakaryocytes dyspoiesis, which supports MDS.  Biopsy was negative for metastatic adenocarcinoma.  -cytogenetics and MDS FISH were normal  -he has responded well to Promacta -Due to his recent stroke, will keep Promacta at low-dose, and keep platelet count in 100-200 range

## 2022-12-20 NOTE — Assessment & Plan Note (Signed)
cT2N0M0, stage IB, ypT2N0  -diagnosed in 07/2021, s/p neoadjuvant chemo gemcitabine alone on 08/20/21. Abraxane was added with cycle 2 (09/17/21), but he tolerated poorly and discontinued after first dose.  -s/p whipple 11/15/21 with Dr. Freida Busman. 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 Margins and lymph nodes negative. -s/p adjuvant gemcitabine 01/20/22 - 06/18/2022 -restaging CT from 08/17/22 showed small soft tissue nodule or lymph node interposed between the anterior aorta, portal vein, left renal vein, and superior mesenteric artery measuring 1.3 x 0.8 cm. This is new or at least significantly increased compared to prior CT 05/03/2022 and suspicious for local recurrence or nodal metastasis -unfortunately biopsy is not feasible due to location  -S/p SBRT, completed 09/30/2022 -Surveillance CT scan from Dec 15, 2022 showed no definitive evidence of recurrence, however the skin quality is limited due to lack of IV contrast, especially in the surgical site.  Plan to repeat abdominal MRI with contrast in 2 to 3 months. -will repeat CA19.9

## 2022-12-20 NOTE — Progress Notes (Signed)
[facility]   [phone] [phone]   Clinic Follow up Note   Patient Care Team: Carlean Jews, NP as PCP - General (Family Medicine) Jodelle Red, MD as PCP - Cardiology (Cardiology) Malachy Mood, MD as Consulting Physician (Oncology) Marily Lente, RN as Oncology Nurse Navigator (Oncology)  Date of Service:  12/20/2022  CHIEF COMPLAINT: f/u of pancreatic cancer and MDS   CURRENT THERAPY:   Pancreatic cancer: s/p neoadjuvant chemotherapy, whipple 11/15/21, adjuvant chemo 06/18/22, and SBRT to oligo metastatic disease completed 09/30/22. Now on observation  MDS: Promacta, platelet range 100 - 200    ASSESSMENT:  Angel Ruschak. is a 82 y.o. male with   MDS (myelodysplastic syndrome) (HCC) developed worsening thrombocytopenia in December 2023, s/p platelet transfusion.  -Bone marrow biopsy 07/28/22 showed hypercellular marrow and megakaryocytes dyspoiesis, which supports MDS.  Biopsy was negative for metastatic adenocarcinoma.  -cytogenetics and MDS FISH were normal  -he has responded well to Promacta -Due to his recent stroke, will keep Promacta at low-dose, and keep platelet count in 100-200 range.  Promacta was held when he had a ED visit for diarrhea 2 weeks ago.  I will repeat his CBC today, if platelet count low, will restart Promacta.  Thrombocytopenia (HCC) -Secondary to MDS -On Promacta, will give a platelet count in 100-200 range due to his recent stroke, will repeat a CBC today.  Pancreatic cancer (HCC)  cT2N0M0, stage IB, ypT2N0  -diagnosed in 07/2021, s/p neoadjuvant chemo gemcitabine alone on 08/20/21. Abraxane was added with cycle 2 (09/17/21), but he tolerated poorly and discontinued after first dose.  -s/p whipple 11/15/21 with Dr. Freida Busman. 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 Margins and lymph nodes negative. -s/p adjuvant gemcitabine 01/20/22 - 06/18/2022 -restaging CT  from 08/17/22 showed small soft tissue nodule or lymph node interposed between the anterior aorta, portal vein, left renal vein, and superior mesenteric artery measuring 1.3 x 0.8 cm. This is new or at least significantly increased compared to prior CT 05/03/2022 and suspicious for local recurrence or nodal metastasis -unfortunately biopsy is not feasible due to location  -S/p SBRT, completed 09/30/2022 -Surveillance CT scan from Dec 15, 2022 showed no definitive evidence of recurrence, however the skin quality is limited due to lack of IV contrast, especially in the surgical site.  Plan to repeat abdominal MRI with contrast in 2 to 3 months. -will repeat CA19.9  C. difficile colitis and AKI  -Patient presented with diarrhea and acute renal failure last months -Stool test on December 12, 2022 was positive for C. difficile, he started oral vancomycin.     PLAN: -labs order today, will be done after my office visit. -Recommend to finish the oral Vancomycin for C-difficile -I prescribe Megace for appetite stimulation -Repeat scan in 2-3 months -lab  and f/u -6 /10, or sooner if needed   SUMMARY OF ONCOLOGIC HISTORY: Oncology History Overview Note   Cancer Staging  Pancreatic cancer [facility]) Staging form: Exocrine Pancreas, AJCC 8th Edition - Clinical stage from 08/05/2021: Stage IB (cT2, cN0, cM0) - Signed by Malachy Mood, MD on 08/17/2021 Stage prefix: Initial diagnosis Total positive nodes: 0     Pancreatic mass  07/18/2021 Initial Diagnosis   Pancreatic mass   07/18/2021 Imaging   CT Abdomen Pelvis W Contrast  IMPRESSION: 1. Suspected solid mass within the proximal body of the pancreas measures 2.3 x 2.6 x 2.0 cm. There is downstream dilation of the main pancreatic  duct and its branches. There is also intra and extrahepatic biliary ductal dilation. Further evaluation with MRI of the abdomen, pancreatic protocol, when clinically feasible, may be considered. 2. 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 3. Aortic atherosclerosis   07/20/2021 Procedure   DG ERCP  IMPRESSION: Nondiagnostic ERCP as above. Correlation with the operative report is advised.  - The examination was suspicious for a biliary stricture in the bile duct. - Examination was suspicious for carcinoma of the head of the pancreas. - Attempts at a cholangiogram failed.   07/22/2021 Pathology Results   CASE: [id number]   FINAL MICROSCOPIC DIAGNOSIS:  - Suspicious for malignancy  - See comment   DIAGNOSTIC COMMENTS:  There are rare cells with cytologic atypia suspicious for  adenocarcinoma.  Dr. Kenard Gower agrees.    07/22/2021 Procedure   IR INT EXT BILIARY DRAIN WITH CHOLANGIOGRAM ( IMPRESSION: 1. 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 2. Successful brush biopsy of obstructing lesion x3. 3. Successful placement of a 10 French internal/external biliary drainage catheter.   PLAN: 1. Follow bilirubin. When significant downward trend is clear, the bag should be capped. Recommend capping bag before discharge home if possible. 2. Follow-up in IR in 4-6 weeks for initial biliary tube check and exchange. If initial brush biopsies are negative, repeat biopsy could be considered at that time.   Pancreatic cancer (HCC)  08/05/2021 Cancer Staging   Staging form: Exocrine Pancreas, AJCC 8th Edition - Clinical stage from 08/05/2021: Stage IB (cT2, cN0, cM0) - Signed by Malachy Mood, MD on 08/17/2021 Stage prefix: Initial diagnosis Total positive nodes: 0   08/14/2021 Initial Diagnosis   Pancreatic cancer (HCC)   08/20/2021 - 03/23/2022 Chemotherapy   Patient is on Treatment Plan : PANCREATIC Abraxane / Gemcitabine D1,8 q21d     08/20/2021 -  Chemotherapy   Patient is on Treatment Plan : PANCREAS Gemcitabine D1,8 (1000) q21d x 8 Cycles     11/15/2021 Cancer Staging   Staging form: Exocrine Pancreas, AJCC 8th Edition -  Pathologic stage from 11/15/2021: Stage IB (ypT2, pN0, cM0) - Signed by Malachy Mood, MD on 01/03/2022 Stage prefix: Post-therapy Total positive nodes: 0 Histologic grade (G): G3 Histologic grading system: 3 grade system Residual tumor (R): R0 - None      INTERVAL HISTORY:  Angel Frederic Sr. is here for a follow up of pancreatic cancer and MDS. He was last seen by NP Lacie on 12/12/2022. He presents to the clinic accompanied by wife.daughter. Pt state that he still have some diarrhea, but it has gotten better. Pt state that he feels nauseous. Pt has some stomach pain, but its tolerable. His appetite is not what it use to be, but he does eat soup due to not able to chew well.   All other systems were reviewed with the patient and are negative.  MEDICAL HISTORY:  Past Medical History:  Diagnosis Date   Anxiety    Arthritis    Depression    Family history of breast cancer    Family history of lung cancer    Headache    HLD (hyperlipidemia)    Hx of inguinal hernia repair    Hypertension    Hypothyroidism    Nausea with vomiting 09/21/2021   Overactive bladder    Pancreatic cancer (HCC)    Pneumonia    Shingles    Sleep apnea    Patient denies   Thoracic ascending aortic aneurysm (HCC)  mildly dilated 4.0cm per 08/12/21 CT   Thyroid disease    Varicose veins of both lower extremities     SURGICAL HISTORY: Past Surgical History:  Procedure Laterality Date   BILATERAL CARPAL TUNNEL RELEASE     CATARACT EXTRACTION, BILATERAL Bilateral    ELBOW SURGERY Bilateral    ENDOSCOPIC RETROGRADE CHOLANGIOPANCREATOGRAPHY (ERCP) WITH PROPOFOL N/A 07/20/2021   Procedure: ENDOSCOPIC RETROGRADE CHOLANGIOPANCREATOGRAPHY (ERCP) WITH PROPOFOL;  Surgeon: Iva Boop, MD;  Location[facility] ENDOSCOPY;  Service: Endoscopy;  Laterality: N/A;   ESOPHAGOGASTRODUODENOSCOPY (EGD) WITH PROPOFOL N/A 08/05/2021   Procedure: ESOPHAGOGASTRODUODENOSCOPY (EGD) WITH PROPOFOL;  Surgeon: Meridee Score Netty Starring.,  MD;  Location: [facility] ENDOSCOPY;  Service: Gastroenterology;  Laterality: N/A;   EUS N/A 08/05/2021   Procedure: UPPER ENDOSCOPIC ULTRASOUND (EUS) RADIAL;  Surgeon: Lemar Lofty., MD;  Location: [facility] ENDOSCOPY;  Service: Gastroenterology;  Laterality: N/A;   EYE SURGERY     bilateral cataracts   FINE NEEDLE ASPIRATION  08/05/2021   Procedure: FINE NEEDLE ASPIRATION (FNA) LINEAR;  Surgeon: Lemar Lofty., MD;  Location: [facility] ENDOSCOPY;  Service: Gastroenterology;;   INGUINAL HERNIA REPAIR Right 1951   IR ANGIO INTRA EXTRACRAN SEL COM CAROTID INNOMINATE BILAT MOD SED  02/20/2019   IR BILIARY STENT(S) EXISTING ACCESS INC DILATION CATH EXCHANGE  09/20/2021   IR ENDOLUMINAL BX OF BILIARY TREE  07/22/2021   IR GENERIC HISTORICAL  10/28/2016   IR RADIOLOGIST EVAL & MGMT 10/28/2016 [facility]-INTERV RAD   IR INT EXT BILIARY DRAIN WITH CHOLANGIOGRAM  07/22/2021   JOINT REPLACEMENT     LAPAROSCOPY N/A 11/15/2021   Procedure: LAPAROSCOPIC DIAGNOSTIC STAGING;  Surgeon: Fritzi Mandes, MD;  Location: [facility] OR;  Service: General;  Laterality: N/A;   PORTACATH PLACEMENT N/A 08/19/2021   Procedure: INSERTION PORT-A-CATH;  Surgeon: Fritzi Mandes, MD;  Location: [facility] ORS;  Service: General;  Laterality: N/A;  LMA   RADIOLOGY WITH ANESTHESIA N/A 10/21/2015   Procedure: RADIOLOGY WITH ANESTHESIA;  Surgeon: Julieanne Cotton, MD;  Location: [facility] OR;  Service: Radiology;  Laterality: N/A;   RADIOLOGY WITH ANESTHESIA N/A 02/20/2019   Procedure: Sharman Crate;  Surgeon: Julieanne Cotton, MD;  Location: [facility] OR;  Service: Radiology;  Laterality: N/A;   ROTATOR CUFF REPAIR Bilateral    VARICOSE VEIN SURGERY     WHIPPLE PROCEDURE N/A 11/15/2021   Procedure: WHIPPLE PROCEDURE;  Surgeon: Fritzi Mandes, MD;  Location: [facility] OR;  Service: General;  Laterality: N/A;    I have reviewed the social history and family history with the patient and they are unchanged from previous note.  ALLERGIES:  has No Known  Allergies.  MEDICATIONS:  Current Outpatient Medications  Medication Sig Dispense Refill   megestrol (MEGACE ES) 625 MG/5ML suspension Take 5 mLs (625 mg total) by mouth daily. 150 mL 0   amLODipine (NORVASC) 5 MG tablet TAKE 1 TABLET (5 MG TOTAL) BY MOUTH DAILY. 90 tablet 1   aspirin EC 81 MG tablet Take 1 tablet (81 mg total) by mouth daily. Swallow whole. 30 tablet 12   atorvastatin (LIPITOR) 40 MG tablet Take 1 tablet (40 mg total) by mouth at bedtime. 30 tablet 3   Cholecalciferol (VITAMIN D3) 50 MCG (2000 UT) TABS Take 2,000 Units by mouth daily.     eltrombopag (PROMACTA) 50 MG tablet Take 1 tablet (50 mg total) by mouth daily. Take on an empty stomach 1 hour before a meal or 2 hours after 30 tablet 2   escitalopram (LEXAPRO) 20 MG tablet Take 1 tablet (20  mg total) by mouth daily. 90 tablet 1   levothyroxine (SYNTHROID) 112 MCG tablet Take 1 tablet (112 mcg total) by mouth daily before breakfast. 90 tablet 1   lidocaine-prilocaine (EMLA) cream Apply topically to affected area(s) as needed. (Patient taking differently: Apply 1 Application topically as needed (port).) 30 g 0   lipase/protease/amylase (CREON) 36000 UNITS CPEP capsule Take 1 capsule (36,000 Units total) by mouth 3 (three) times daily before meals. (Patient taking differently: Take 72,000 Units by mouth 3 (three) times daily before meals.) 180 capsule 3   loperamide (IMODIUM) 2 MG capsule Take 1 capsule (2 mg total) by mouth 4 (four) times daily as needed for diarrhea or loose stools. 12 capsule 0   mirtazapine (REMERON) 7.5 MG tablet Take 1 tablet (7.5 mg total) by mouth at bedtime. 90 tablet 1   pantoprazole (PROTONIX) 40 MG tablet Take 1 tablet by mouth daily. 90 tablet 0   potassium chloride SA (KLOR-CON M) 20 MEQ tablet Take 1 tablet (20 mEq total) by mouth 2 (two) times daily. 60 tablet 0   prochlorperazine (COMPAZINE) 10 MG tablet Take 1 tablet (10 mg total) by mouth every 6 (six) hours as needed (Nausea or vomiting). 30  tablet 1   triamcinolone ointment (KENALOG) 0.1 % Apply 1 Application topically 2 (two) times daily. 80 g 2   vancomycin (VANCOCIN) 125 MG capsule Take 1 capsule (125 mg total) by mouth 4 (four) times daily for 10 days. 40 capsule 0   vitamin B-12 (CYANOCOBALAMIN) 1000 MCG tablet Take 1,000 mcg by mouth 2 (two) times daily.     No current facility-administered medications for this visit.    PHYSICAL EXAMINATION: ECOG PERFORMANCE STATUS: 2 - Symptomatic, <50% confined to bed  Vitals:   12/20/22 1130  BP: 139/77  Pulse: 69  Resp: 16  Temp: 98.2 F (36.8 C)  SpO2: 100%   Wt Readings from Last 3 Encounters:  12/20/22 116 lb 8 oz (52.8 kg)  12/12/22 111 lb 8 oz (50.6 kg)  11/29/22 116 lb 13.5 oz (53 kg)     GENERAL:alert, no distress and comfortable SKIN: skin color normal, no rashes or significant lesions EYES: normal, Conjunctiva are pink and non-injected, sclera clear  NEURO: alert & oriented x 3 with fluent speech LABORATORY DATA:  I have reviewed the data as listed    Latest Ref Rng & Units 12/20/2022   11:58 AM 12/12/2022   11:27 AM 11/29/2022    3:22 PM  CBC  WBC 4.0 - 10.5 K/uL 6.3  8.1  8.6   Hemoglobin 13.0 - 17.0 g/dL 9.9  16.1  8.7   Hematocrit 39.0 - 52.0 % 29.8  32.0  27.6   Platelets 150 - 400 K/uL 269  441  259         Latest Ref Rng & Units 12/20/2022   11:58 AM 12/15/2022    8:27 AM 12/12/2022   11:27 AM  CMP  Glucose 70 - 99 mg/dL 98  89  93   BUN 8 - 23 mg/dL 14  13  12    Creatinine 0.61 - 1.24 mg/dL 0.96  0.45  4.09   Sodium 135 - 145 mmol/L 135  140  142   Potassium 3.5 - 5.1 mmol/L 5.8  3.1  2.7   Chloride 98 - 111 mmol/L 104  105  104   CO2 22 - 32 mmol/L 26  29  29    Calcium 8.9 - 10.3 mg/dL 8.3  8.2  8.3  Total Protein 6.5 - 8.1 g/dL 6.0  5.8  6.0   Total Bilirubin 0.3 - 1.2 mg/dL 0.4  1.4  1.2   Alkaline Phos 38 - 126 U/L 83  71  76   AST 15 - 41 U/L 76  33  29   ALT 0 - 44 U/L 29  14  11        RADIOGRAPHIC STUDIES: I have personally  reviewed the radiological images as listed and agreed with the findings in the report. No results found.    Orders Placed This Encounter  Procedures   Cancer antigen 19-9    Standing Status:   Standing    Number of Occurrences:   20    Standing Expiration Date:   12/20/2023   All questions were answered. The patient knows to call the clinic with any problems, questions or concerns. No barriers to learning was detected. The total time spent in the appointment was 30 minutes.     Malachy Mood, MD 12/20/2022   Carolin Coy, CMA, am acting as scribe for Malachy Mood, MD.   I have reviewed the above documentation for accuracy and completeness, and I agree with the above.

## 2022-12-20 NOTE — Telephone Encounter (Signed)
-----   Message from Malachy Mood, MD sent at 12/20/2022  2:36 PM EDT ----- Please let his daughter know his lab rsult  1. Plt is normal, OK to continue holding on Promacta, do not restart. 2. K high, please stop potassium supplement if he is still on, and avoid high K diet  3. Cr still elevated, let him drink more fluids. If he is agreeable, come back for 1L NS over 2 hours in next few days 4. Schedule lab/flush in 2 weeks

## 2022-12-20 NOTE — Telephone Encounter (Signed)
Left message for daughter Misty Stanley to return call to relay message from Dr. Mosetta Putt below.

## 2022-12-20 NOTE — Progress Notes (Signed)
Spoke to [place] in regard to the lab results today. She verbalized an understanding, patient will not take his Promacta. He did take the medication today when he got home but will hold starting tomorrow. He will discontinue the potassium supplement.  He will come in Thursday for IVF.   Message sent to scheduling.

## 2022-12-20 NOTE — Telephone Encounter (Signed)
Contacted patient to scheduled appointments. Patient is aware of appointments that are scheduled.   

## 2022-12-21 ENCOUNTER — Other Ambulatory Visit: Payer: Self-pay

## 2022-12-22 ENCOUNTER — Encounter: Payer: Self-pay | Admitting: Hematology

## 2022-12-22 ENCOUNTER — Inpatient Hospital Stay: Payer: Medicare HMO

## 2022-12-22 DIAGNOSIS — D696 Thrombocytopenia, unspecified: Secondary | ICD-10-CM | POA: Diagnosis not present

## 2022-12-22 DIAGNOSIS — D469 Myelodysplastic syndrome, unspecified: Secondary | ICD-10-CM | POA: Diagnosis not present

## 2022-12-22 DIAGNOSIS — C251 Malignant neoplasm of body of pancreas: Secondary | ICD-10-CM | POA: Diagnosis not present

## 2022-12-22 DIAGNOSIS — R197 Diarrhea, unspecified: Secondary | ICD-10-CM | POA: Diagnosis not present

## 2022-12-22 DIAGNOSIS — A0472 Enterocolitis due to Clostridium difficile, not specified as recurrent: Secondary | ICD-10-CM | POA: Diagnosis not present

## 2022-12-22 DIAGNOSIS — E878 Other disorders of electrolyte and fluid balance, not elsewhere classified: Secondary | ICD-10-CM

## 2022-12-22 LAB — CANCER ANTIGEN 19-9: CA 19-9: 27 U/mL (ref 0–35)

## 2022-12-22 MED ORDER — SODIUM CHLORIDE 0.9 % IV SOLN: INTRAVENOUS | Status: AC

## 2022-12-22 NOTE — Telephone Encounter (Signed)
error 

## 2022-12-22 NOTE — Patient Instructions (Signed)

## 2022-12-23 ENCOUNTER — Other Ambulatory Visit: Payer: Self-pay

## 2022-12-27 ENCOUNTER — Telehealth: Payer: Self-pay

## 2022-12-27 ENCOUNTER — Other Ambulatory Visit: Payer: Self-pay

## 2022-12-27 DIAGNOSIS — Z0000 Encounter for general adult medical examination without abnormal findings: Secondary | ICD-10-CM

## 2022-12-27 NOTE — Telephone Encounter (Signed)
Unsuccessful attempt to reach patient on preferred number listed in noted for scheduled AWV. Left message on voicemail okay to reschedule. 

## 2022-12-27 NOTE — Progress Notes (Signed)
Subjective:   Angel Keller. is a 82 y.o. male who presents for Medicare Annual/Subsequent preventive examination.  Review of Systems            Objective:    Today's Vitals   There is no height or weight on file to calculate BMI.     12/22/2022    2:03 PM 11/29/2022    2:40 PM 09/16/2022    5:00 PM 09/16/2022   11:10 AM 08/24/2022   12:36 PM 01/20/2022   10:08 AM 11/09/2021    2:44 PM  Advanced Directives  Does Patient Have a Medical Advance Directive? No No Yes Yes Yes Yes Yes  Type of Surveyor, minerals;Living will Living will;Healthcare Power of [employer] Power of [place];Living will  Living will  Does patient want to make changes to medical advance directive? No - Patient declined  No - Patient declined  No - Patient declined No - Patient declined   Copy of Healthcare Power of Attorney in Chart?   No - copy requested  No - copy requested    Would patient like information on creating a medical advance directive?  No - Patient declined   No - Patient declined      Current Medications (verified) Outpatient Encounter Medications as of 12/27/2022  Medication Sig   amLODipine (NORVASC) 5 MG tablet TAKE 1 TABLET (5 MG TOTAL) BY MOUTH DAILY.   aspirin EC 81 MG tablet Take 1 tablet (81 mg total) by mouth daily. Swallow whole.   atorvastatin (LIPITOR) 40 MG tablet Take 1 tablet (40 mg total) by mouth at bedtime.   Cholecalciferol (VITAMIN D3) 50 MCG (2000 UT) TABS Take 2,000 Units by mouth daily.   eltrombopag (PROMACTA) 50 MG tablet Take 1 tablet (50 mg total) by mouth daily. Take on an empty stomach 1 hour before a meal or 2 hours after   escitalopram (LEXAPRO) 20 MG tablet Take 1 tablet (20 mg total) by mouth daily.   levothyroxine (SYNTHROID) 112 MCG tablet Take 1 tablet (112 mcg total) by mouth daily before breakfast.   lidocaine-prilocaine (EMLA) cream Apply topically to affected area(s) as needed. (Patient taking differently: Apply 1  Application topically as needed (port).)   lipase/protease/amylase (CREON) 36000 UNITS CPEP capsule Take 1 capsule (36,000 Units total) by mouth 3 (three) times daily before meals. (Patient taking differently: Take 72,000 Units by mouth 3 (three) times daily before meals.)   loperamide (IMODIUM) 2 MG capsule Take 1 capsule (2 mg total) by mouth 4 (four) times daily as needed for diarrhea or loose stools.   megestrol (MEGACE ES) 625 MG/5ML suspension Take 5 mLs (625 mg total) by mouth daily.   mirtazapine (REMERON) 7.5 MG tablet Take 1 tablet (7.5 mg total) by mouth at bedtime.   pantoprazole (PROTONIX) 40 MG tablet Take 1 tablet by mouth daily.   potassium chloride SA (KLOR-CON M) 20 MEQ tablet Take 1 tablet (20 mEq total) by mouth 2 (two) times daily.   prochlorperazine (COMPAZINE) 10 MG tablet Take 1 tablet (10 mg total) by mouth every 6 (six) hours as needed (Nausea or vomiting).   triamcinolone ointment (KENALOG) 0.1 % Apply 1 Application topically 2 (two) times daily.   vitamin B-12 (CYANOCOBALAMIN) 1000 MCG tablet Take 1,000 mcg by mouth 2 (two) times daily.   No facility-administered encounter medications on file as of 12/27/2022.    Allergies (verified) Patient has no known allergies.   History: Past Medical History:  Diagnosis Date   Anxiety    Arthritis    Depression    Family history of breast cancer    Family history of lung cancer    Headache    HLD (hyperlipidemia)    Hx of inguinal hernia repair    Hypertension    Hypothyroidism    Nausea with vomiting 09/21/2021   Overactive bladder    Pancreatic cancer (HCC)    Pneumonia    Shingles    Sleep apnea    Patient denies   Thoracic ascending aortic aneurysm (HCC)    mildly dilated 4.0cm per 08/12/21 CT   Thyroid disease    Varicose veins of both lower extremities    Past Surgical History:  Procedure Laterality Date   BILATERAL CARPAL TUNNEL RELEASE     CATARACT EXTRACTION, BILATERAL Bilateral    ELBOW SURGERY  Bilateral    ENDOSCOPIC RETROGRADE CHOLANGIOPANCREATOGRAPHY (ERCP) WITH PROPOFOL N/A 07/20/2021   Procedure: ENDOSCOPIC RETROGRADE CHOLANGIOPANCREATOGRAPHY (ERCP) WITH PROPOFOL;  Surgeon: Iva Boop, MD;  Location: [facility] ENDOSCOPY;  Service: Endoscopy;  Laterality: N/A;   ESOPHAGOGASTRODUODENOSCOPY (EGD) WITH PROPOFOL N/A 08/05/2021   Procedure: ESOPHAGOGASTRODUODENOSCOPY (EGD) WITH PROPOFOL;  Surgeon: Meridee Score Netty Starring., MD;  [facility] END[facility]COPY;  Service: Gastroenterology;  Laterality: N/A;   EUS N/A 08/05/2021   Procedure: UPPER ENDOSCOPIC ULTRASOUND (EUS) RADIAL;  Surgeon: Lemar Lofty., MD;  Location: [facility] ENDOSCOPY;  Service: Gastroenterology;  Laterality: N/A;   EYE SURGERY     bilateral cataracts   FINE NEEDLE ASPIRATION  08/05/2021   Procedure: FINE NEEDLE ASPIRATION (FNA) LINEAR;  Surgeon: Lemar Lofty., MD;  Location: [facility] ENDOSCOPY;  Service: Gastroenterology;;   INGUINAL HERNIA REPAIR Right 1951   IR ANGIO INTRA EXTRACRAN SEL COM CAROTID INNOMINATE BILAT MOD SED  02/20/2019   IR BILIARY STENT(S) EXISTING ACCESS INC DILATION CATH EXCHANGE  09/20/2021   IR ENDOLUMINAL BX OF BILIARY TREE  07/22/2021   IR GENERIC HISTORICAL  10/28/2016   IR RADIOLOGIST EVAL & MGMT 10/28/2016 [facility]INTER[facility] RAD   IR INT EXT BILIARY DRAIN WITH CHOLANGIOGRAM  07/22/2021   JOINT REPLACEMENT     LAPAROSCOPY N/A 11/15/2021   Procedure: LAPAROSCOPIC DIAGNOSTIC STAGING;  Surgeon: Fritzi Mandes, MD;  Location: [facility] OR;  Service: General;  Laterality: N/A;   PORTACATH PLACEMENT N/A 08/19/2021   Procedure: INSERTION PORT-A-CATH;  Surgeon: Fritzi Mandes, MD;  Location: [facility] ORS;  Service: General;  Laterality: N/A;  LMA   RADIOLOGY WITH ANESTHESIA N/A 10/21/2015   Procedure: RADIOLOGY WITH ANESTHESIA;  Surgeon: Julieanne Cotton, MD;  Location: [facility] OR;  Service: Radiology;  Laterality: N/A;   RADIOLOGY WITH ANESTHESIA N/A 02/20/2019   Procedure: Sharman Crate;  Surgeon: Julieanne Cotton, MD;   Location: [facility] OR;  Service: Radiology;  Laterality: N/A;   ROTATOR CUFF REPAIR Bilateral    VARICOSE VEIN SURGERY     WHIPPLE PROCEDURE N/A 11/15/2021   Procedure: WHIPPLE PROCEDURE;  Surgeon: Fritzi Mandes, MD;  Location: [facility] OR;  Service: General;  Laterality: N/A;   Family History  Problem Relation Age of Onset   Breast cancer Mother    Lung cancer Father    Alcoholism Sister    Cirrhosis Sister    Colon cancer Neg Hx    Esophageal cancer Neg Hx    Liver cancer Neg Hx    Pancreatic cancer Neg Hx    Prostate cancer Neg Hx    Social History   Socioeconomic History   Marital status: Widowed    Spouse name: Not  on file   Number of children: 5   Years of education: Not on file   Highest education level: Not on file  Occupational History   Occupation: retired  Tobacco Use   Smoking status: Former    Packs/day: 0.25    Years: 1.00    Additional pack years: 0.00    Total pack years: 0.25    Types: Cigarettes    Quit date: 08/15/1961    Years since quitting: 61.4   Smokeless tobacco: Never  Vaping Use   Vaping Use: Never used  Substance and Sexual Activity   Alcohol use: No   Drug use: No   Sexual activity: Not Currently  Other Topics Concern   Not on file  Social History Narrative   Not on file   Social Determinants of Health   Financial Resource Strain: High Risk (03/10/2022)   Overall Financial Resource Strain (CARDIA)    Difficulty of Paying Living Expenses: Hard  Food Insecurity: No Food Insecurity (09/16/2022)   Hunger Vital Sign    Worried About Running Out of Food in the Last Year: Never true    Ran Out of Food in the Last Year: Never true  Transportation Needs: No Transportation Needs (09/16/2022)   PRAPARE - Administrator, Civil Service (Medical): No    Lack of Transportation (Non-Medical): No  Physical Activity: Not on file  Stress: Not on file  Social Connections: Not on file    Tobacco Counseling Counseling given: Not  Answered   Clinical Intake:                 Diabetic?          Activities of Daily Living    09/16/2022    5:00 PM 07/28/2022    9:32 AM  In your present state of health, do you have any difficulty performing the following activities:  Hearing? 1 0  Vision? 0 0  Difficulty concentrating or making decisions? 0 0  Walking or climbing stairs? 1 0  Dressing or bathing? 0 0  Doing errands, shopping? 0     Patient Care Team: Carlean Jews, NP as PCP - General (Family Medicine) Jodelle Red, MD as PCP - Cardiology (Cardiology) Malachy Mood, MD as Consulting Physician (Oncology) Marily Lente, RN as Oncology Nurse Navigator (Oncology)  Indicate any recent Medical Services you may have received from other than [facility] providers in the past year (date may be approximate).     Assessment:   This is a routine wellness examination for Maxsim.  Hearing/Vision screen No results found.  Dietary issues and exercise activities discussed:     Goals Addressed   None    Depression Screen    02/03/2022    3:07 PM 10/07/2021    2:23 PM 09/07/2021    1:50 PM 08/26/2021    1:33 PM 07/02/2021   11:05 AM  PHQ 2/9 Scores  PHQ - 2 Score 2 4 2 4 1   PHQ- 9 Score 10 8 9 10 3     Fall Risk    09/07/2021    1:50 PM 08/26/2021    1:33 PM 07/02/2021   11:06 AM  Fall Risk   Falls in the past year? 0 0 0  Number falls in past yr: 0 0 0  Injury with Fall? 0 0 0  Follow up Falls evaluation completed Falls evaluation completed Falls evaluation completed    FALL RISK PREVENTION PERTAINING TO THE HOME:  Any stairs in or  around the home?   If so, are there any without handrails?  Home free of loose throw rugs in walkways, pet beds, electrical cords, etc?  Adequate lighting in your home to reduce risk of falls?   ASSISTIVE DEVICES UTILIZED TO PREVENT FALLS:  Life alert?  Use of a cane, walker or w/c?  Grab bars in the bathroom?  Shower chair or bench in shower?   Elevated toilet seat or a handicapped toilet?   TIMED UP AND GO:  Was the test performed? .  Length of time to ambulate 10 feet:  sec.     Cognitive Function:        08/26/2021    1:35 PM  6CIT Screen  What Year? 0 points  What time? 0 points  Count back from 20 0 points  Months in reverse 0 points  Repeat phrase 0 points    Immunizations Immunization History  Administered Date(s) Administered   Fluad Quad(high Dose 65+) 09/17/2022   H1N1 11/07/2008   Influenza Whole 06/13/2007, 04/23/2009   Influenza, High Dose Seasonal PF 04/09/2018   PFIZER(Purple Top)SARS-COV-2 Vaccination 12/18/2019, 01/15/2020   Pneumococcal Polysaccharide-23 11/07/2008, 09/17/2022   Td 11/07/2008            Qualifies for Shingles Vaccine?   Zostavax completed     Screening Tests Health Maintenance  Topic Date Due   DTaP/Tdap/Td (2 - Tdap) 11/08/2018   COVID-19 Vaccine (3 - Pfizer risk series) 02/12/2020   Zoster Vaccines- Shingrix (1 of 2) 02/10/2023 (Originally 12/29/1959)   INFLUENZA VACCINE  03/16/2023   Pneumonia Vaccine 37+ Years old (3 of 3 - PCV) 09/18/2023   Medicare Annual Wellness (AWV)  12/27/2023   HPV VACCINES  Aged Out    Health Maintenance  Health Maintenance Due  Topic Date Due   DTaP/Tdap/Td (2 - Tdap) 11/08/2018   COVID-19 Vaccine (3 - Pfizer risk series) 02/12/2020      Lung Cancer Screening: (Low Dose CT Chest recommended if Age 62-80 years, 30 pack-year currently smoking OR have quit w/in 15years.)  qualify.   Lung Cancer Screening Referral:   Additional Screening:  Hepatitis C Screening:  qualify; Completed   Vision Screening: Recommended annual ophthalmology exams for early detection of glaucoma and other disorders of the eye. Is the patient up to date with their annual eye exam?   Who is the provider or what is the name of the office in which the patient attends annual eye exams?  If pt is not established with a provider, would they like to  be referred to a provider to establish care? .   Dental Screening: Recommended annual dental exams for proper oral hygiene  Community Resource Referral / Chronic Care Management: CRR required this visit?    CCM required this visit?      Plan:     I have personally reviewed and noted the following in the patient's chart:   Medical and social history Use of alcohol, tobacco or illicit drugs  Current medications and supplements including opioid prescriptions. Functional ability and status Nutritional status Physical activity Advanced directives List of other physicians Hospitalizations, surgeries, and ER visits in previous 12 months Vitals Screenings to include cognitive, depression, and falls Referrals and appointments  In addition, I have reviewed and discussed with patient certain preventive protocols, quality metrics, and best practice recommendations. A written personalized care plan for preventive services as well as general preventive health recommendations were provided to patient.     Tillie Rung, LPN  12/27/2022   Nurse Notes:    This encounter was created in error - please disregard.

## 2022-12-29 ENCOUNTER — Other Ambulatory Visit (HOSPITAL_COMMUNITY): Payer: Self-pay

## 2023-01-02 ENCOUNTER — Ambulatory Visit (INDEPENDENT_AMBULATORY_CARE_PROVIDER_SITE_OTHER): Payer: Medicare HMO | Admitting: Nurse Practitioner

## 2023-01-02 ENCOUNTER — Encounter: Payer: Self-pay | Admitting: Nurse Practitioner

## 2023-01-02 VITALS — BP 148/70 | HR 66 | Ht 70.0 in | Wt 125.1 lb

## 2023-01-02 DIAGNOSIS — C251 Malignant neoplasm of body of pancreas: Secondary | ICD-10-CM

## 2023-01-02 DIAGNOSIS — I10 Essential (primary) hypertension: Secondary | ICD-10-CM

## 2023-01-02 DIAGNOSIS — R634 Abnormal weight loss: Secondary | ICD-10-CM

## 2023-01-02 DIAGNOSIS — R630 Anorexia: Secondary | ICD-10-CM

## 2023-01-02 DIAGNOSIS — K219 Gastro-esophageal reflux disease without esophagitis: Secondary | ICD-10-CM | POA: Diagnosis not present

## 2023-01-02 DIAGNOSIS — E039 Hypothyroidism, unspecified: Secondary | ICD-10-CM | POA: Diagnosis not present

## 2023-01-02 MED ORDER — PANTOPRAZOLE SODIUM 40 MG PO TBEC
40.0000 mg | DELAYED_RELEASE_TABLET | Freq: Two times a day (BID) | ORAL | 1 refills | Status: DC
Start: 2023-01-02 — End: 2023-02-24

## 2023-01-02 MED ORDER — MEGESTROL ACETATE 625 MG/5ML PO SUSP
625.0000 mg | Freq: Every day | ORAL | 2 refills | Status: DC
Start: 2023-01-02 — End: 2023-02-24

## 2023-01-02 NOTE — Progress Notes (Unsigned)
Established patient visit   Patient: Angel Keller.   DOB: 02/16/1941   82 y.o. Male  MRN: [id number] Visit Date: 01/02/2023   Chief Complaint  Patient presents with   Medical Management of Chronic Issues   Subjective    HPI  Follow up -continues  on remeron 7.5 mg every evening  -continues to take lexapro 20 mg daily.  --needs refill of Megace to help with appetite.  --appetite has improved.  --has had weight gain since last visit of 9 pounds  --states that he is working out, even using light weights.   -hypertension  --slightly elevated today  --was taken off blood pressure medication recently due to low blood pressure. --will see cardiology tomorrow.   Had fall at home onto the carpet at home. Happened last Sunday 12/25/2022.   Has traumatic abrasion of the left elbow and left shoulder.  -daughter keeping it clean and applying antibiotic ointment.  -no loss of strength or ROM in left shoulder or left elbow.    Medications: Outpatient Medications Prior to Visit  Medication Sig   amLODipine (NORVASC) 5 MG tablet TAKE 1 TABLET (5 MG TOTAL) BY MOUTH DAILY.   atorvastatin (LIPITOR) 40 MG tablet Take 1 tablet (40 mg total) by mouth at bedtime.   Cholecalciferol (VITAMIN D3) 50 MCG (2000 UT) TABS Take 2,000 Units by mouth daily.   eltrombopag (PROMACTA) 50 MG tablet Take 1 tablet (50 mg total) by mouth daily. Take on an empty stomach 1 hour before a meal or 2 hours after   escitalopram (LEXAPRO) 20 MG tablet Take 1 tablet (20 mg total) by mouth daily.   levothyroxine (SYNTHROID) 112 MCG tablet Take 1 tablet (112 mcg total) by mouth daily before breakfast.   lidocaine-prilocaine (EMLA) cream Apply topically to affected area(s) as needed. (Patient taking differently: Apply 1 Application topically as needed (port).)   lipase/protease/amylase (CREON) 36000 UNITS CPEP capsule Take 1 capsule (36,000 Units total) by mouth 3 (three) times daily before meals. (Patient taking  differently: Take 72,000 Units by mouth 3 (three) times daily before meals.)   loperamide (IMODIUM) 2 MG capsule Take 1 capsule (2 mg total) by mouth 4 (four) times daily as needed for diarrhea or loose stools.   mirtazapine (REMERON) 7.5 MG tablet Take 1 tablet (7.5 mg total) by mouth at bedtime.   prochlorperazine (COMPAZINE) 10 MG tablet Take 1 tablet (10 mg total) by mouth every 6 (six) hours as needed (Nausea or vomiting).   vitamin B-12 (CYANOCOBALAMIN) 1000 MCG tablet Take 1,000 mcg by mouth 2 (two) times daily.   [DISCONTINUED] aspirin EC 81 MG tablet Take 1 tablet (81 mg total) by mouth daily. Swallow whole.   [DISCONTINUED] megestrol (MEGACE ES) 625 MG/5ML suspension Take 5 mLs (625 mg total) by mouth daily.   [DISCONTINUED] pantoprazole (PROTONIX) 40 MG tablet Take 1 tablet by mouth daily.   [DISCONTINUED] potassium chloride SA (KLOR-CON M) 20 MEQ tablet Take 1 tablet (20 mEq total) by mouth 2 (two) times daily.   [DISCONTINUED] triamcinolone ointment (KENALOG) 0.1 % Apply 1 Application topically 2 (two) times daily.   No facility-administered medications prior to visit.    Review of Systems See HPI    Last CBC Lab Results  Component Value Date   WBC 6.3 12/20/2022   HGB 9.9 (L) 12/20/2022   HCT 29.8 (L) 12/20/2022   MCV 83.2 12/20/2022   MCH 27.7 12/20/2022   RDW 17.5 (H) 12/20/2022   PLT 269 12/20/2022   Last  metabolic panel Lab Results  Component Value Date   GLUCOSE 98 12/20/2022   NA 135 12/20/2022   K 5.8 (H) 12/20/2022   CL 104 12/20/2022   CO2 26 12/20/2022   BUN 14 12/20/2022   CREATININE 1.49 (H) 12/20/2022   GFRNONAA 47 (L) 12/20/2022   CALCIUM 8.3 (L) 12/20/2022   PHOS 3.5 07/09/2009   PROT 6.0 (L) 12/20/2022   ALBUMIN 2.6 (L) 12/20/2022   LABGLOB 2.9 07/27/2021   AGRATIO 1.4 07/27/2021   BILITOT 0.4 12/20/2022   ALKPHOS 83 12/20/2022   AST 76 (H) 12/20/2022   ALT 29 12/20/2022   ANIONGAP 5 12/20/2022   Last lipids Lab Results  Component  Value Date   CHOL 130 09/17/2022   HDL 59 09/17/2022   LDLCALC 61 09/17/2022   LDLDIRECT 149.7 11/07/2008   TRIG 49 09/17/2022   CHOLHDL 2.2 09/17/2022   Last hemoglobin A1c Lab Results  Component Value Date   HGBA1C 5.2 09/16/2022   Last thyroid functions Lab Results  Component Value Date   TSH 4.060 07/27/2021   T3TOTAL <0.1 Result repeated and verified. (L) 07/11/2009   T4TOTAL 1.1 (L) 07/11/2009    Last vitamin B12 and Folate Lab Results  Component Value Date   VITAMINB12 1,288 (H) 09/05/2022       Objective     Today's Vitals   01/02/23 1440 01/02/23 1532  BP: (Abnormal) 146/78 (Abnormal) 148/70  Pulse: 66   SpO2: 98%   Weight: 125 lb 1.9 oz (56.8 kg)   Height: 5\' 10"  (1.778 m)    Body mass index is 17.95 kg/m.  BP Readings from Last 3 Encounters:  01/03/23 (Abnormal) 154/74  01/02/23 (Abnormal) 148/70  12/22/22 138/78    Wt Readings from Last 3 Encounters:  01/03/23 124 lb 3.2 oz (56.3 kg)  01/02/23 125 lb 1.9 oz (56.8 kg)  12/20/22 116 lb 8 oz (52.8 kg)    Physical Exam Vitals and nursing note reviewed.  Constitutional:      Appearance: Normal appearance. He is well-developed and underweight.  HENT:     Head: Normocephalic and atraumatic.     Nose: Nose normal.     Mouth/Throat:     Mouth: Mucous membranes are moist.     Pharynx: Oropharynx is clear.  Eyes:     Extraocular Movements: Extraocular movements intact.     Conjunctiva/sclera: Conjunctivae normal.     Pupils: Pupils are equal, round, and reactive to light.  Cardiovascular:     Rate and Rhythm: Normal rate and regular rhythm.     Pulses: Normal pulses.     Heart sounds: Normal heart sounds.  Pulmonary:     Effort: Pulmonary effort is normal.     Breath sounds: Normal breath sounds.  Abdominal:     Palpations: Abdomen is soft.  Musculoskeletal:        General: Normal range of motion.     Cervical back: Normal range of motion and neck supple.  Lymphadenopathy:     Cervical:  No cervical adenopathy.  Skin:    General: Skin is warm and dry.     Capillary Refill: Capillary refill takes less than 2 seconds.  Neurological:     General: No focal deficit present.     Mental Status: He is alert and oriented to person, place, and time.  Psychiatric:        Mood and Affect: Mood normal.        Behavior: Behavior normal.  Thought Content: Thought content normal.        Judgment: Judgment normal.       Assessment & Plan    Essential hypertension Assessment & Plan: Blood pressure elevated today. -Cardiologist recently lowered blood pressure medication dosages. -Has appointment later this week with cardiology.   Acquired hypothyroidism Assessment & Plan: Thyroid panel stable. -.Continue current medication.    Abnormal weight loss Assessment & Plan: Weight stable.  -conitnue megace daily. New prescription sent to pharmacy today   Orders: -     Megestrol Acetate; Take 5 mLs (625 mg total) by mouth daily.  Dispense: 150 mL; Refill: 2  Decreased appetite Assessment & Plan: Likely due to treatment for pancreatic cancer -continue Megace as previously prescribed.  -refill sent to his pharmacy today   Orders: -     Megestrol Acetate; Take 5 mLs (625 mg total) by mouth daily.  Dispense: 150 mL; Refill: 2  Gastroesophageal reflux disease without esophagitis -     Pantoprazole Sodium; Take 1 tablet (40 mg total) by mouth 2 (two) times daily.  Dispense: 180 tablet; Refill: 1  Malignant neoplasm of body of pancreas [facility]) Assessment & Plan: The patient should continue regular visits with oncology.       Return in about 3 months (around 04/04/2023) for mood.         Carlean Jews, NP  [facility] [phone] (phone) [phone] (fax)  [facility]

## 2023-01-02 NOTE — Progress Notes (Signed)
Cardiology Office Note:    Date:  01/03/2023   ID:  Angel Keller., DOB [birth year], MRN [id number]  PCP:  Carlean Jews, NP  Cardiologist:  Jodelle Red, MD  Referring MD: Carlean Jews, NP   CC: follow up  History of Present Illness:    Angel Keller. is a 82 y.o. male with a hx of hypertension, hyperlipidemia, pancreatic cancer, sleep apnea, and anxiety who is seen for follow up today. I initially met him 09/29/21 as a new consult at the request of Carlean Jews, NP for the evaluation and management of paroxysmal atrial fibrillation in the setting of sepsis.  He was hospitalized 08/27/21 for fever. The fever started the evening after his chemotherapy. His daughter called 911 when he showed weakness and a temperature of 101. While in the ED, his blood pressure dropped to 80/40 and he required IV fluid. Although he had no source of infection, he was started on broad spectrum antibiotics for suspected sepsis. EKG at this time showed atrial fibrillation. He reported he was on Eliquis in the past but had not taken it in a few years. Blood and urine cultures were positive for streptococcus and infection. He was started on Eliquis and diltiazem and was discharged with amoxicillin.   At his last visit, he complained of swelling in his bilateral hands and feet for 1.5 months, usually improved in the mornings. He noted occasional issues with indigestion, racing heart beats when he became upset, and shortness of breath. Since his Whipple procedure he struggled with shortness of breath. He was no longer able to complete his long bicycle rides. His blood pressures were more controlled at home than in the office. About 2 weeks prior he had he received his final chemotherapy treatment. He attributed a weight loss of 30 lbs to his cancer. He had a repeat echocardiogram 09/2022 revealing LVEF 60-65%, grade 1 diastolic dysfunction, trivial mitral valve regurgitation. Agitated saline  contrast bubble study was negative, with no evidence of any interatrial shunt.   He presented to the ED 11/29/2022 with complaints of diarrhea lasting 5-6 days, multiple times each day. Labs had shown mild hyponatremia and AKI, hypokalemia consistent with dehydration, anemia at baseline. Discharged home in stable condition.  Today, he is accompanied by his daughter. He states he is feeling lousy overall and is feeling lightheaded constantly. On Mother's Day he had a dizzy spell with a subsequent fall. He believes that he stood up too quickly at that time. No loss of consciousness.  In clinic today his blood pressure is elevated to 154/74. He did have an argument with his daughter earlier today.  His leg swelling is improved today, but will worsen if he is standing up for a long time. He does wear compression socks which has helped.  His appetite has improved and he has gained weight. Previously 111 lbs and today he is 124 lbs in the office.  He denies any palpitations, chest pain, shortness of breath, headaches, syncope, orthopnea, or PND.   Past Medical History:  Diagnosis Date   Anxiety    Arthritis    Depression    Family history of breast cancer    Family history of lung cancer    Headache    HLD (hyperlipidemia)    Hx of inguinal hernia repair    Hypertension    Hypothyroidism    Nausea with vomiting 09/21/2021   Overactive bladder    Pancreatic cancer (HCC)    Pneumonia  Shingles    Sleep apnea    Patient denies   Thoracic ascending aortic aneurysm (HCC)    mildly dilated 4.0cm per 08/12/21 CT   Thyroid disease    Varicose veins of both lower extremities     Past Surgical History:  Procedure Laterality Date   BILATERAL CARPAL TUNNEL RELEASE     CATARACT EXTRACTION, BILATERAL Bilateral    ELBOW SURGERY Bilateral    ENDOSCOPIC RETROGRADE CHOLANGIOPANCREATOGRAPHY (ERCP) WITH PROPOFOL N/A 07/20/2021   Procedure: ENDOSCOPIC RETROGRADE CHOLANGIOPANCREATOGRAPHY (ERCP)  WITH PROPOFOL;  Surgeon: Iva Boop, MD;  Location: [facility] ENDOSCOPY;  Service: Endoscopy;  Laterality: N/A;   ESOPHAGOGASTRODUODENOSCOPY (EGD) WITH PROPOFOL N/A 08/05/2021   Procedure: ESOPHAGOGASTRODUODENOSCOPY (EGD) WITH PROPOFOL;  Surgeon: Meridee Score Netty Starring., MD;  Location: [facility] ENDOSCOPY;  Service: Gastroenterology;  Laterality: N/A;   EUS N/A 08/05/2021   Procedure: UPPER ENDOSCOPIC ULTRASOUND (EUS) RADIAL;  Surgeon: Lemar Lofty., MD;  Location: [facility] ENDOSCOPY;  Service: Gastroenterology;  Laterality: N/A;   EYE SURGERY     bilateral cataracts   FINE NEEDLE ASPIRATION  08/05/2021   Procedure: FINE NEEDLE ASPIRATION (FNA) LINEAR;  Surgeon: Lemar Lofty., MD;  Location: [facility] ENDOSCOPY;  Service: Gastroenterology;;   INGUINAL HERNIA REPAIR Right 1951   IR ANGIO INTRA EXTRACRAN SEL COM CAROTID INNOMINATE BILAT MOD SED  02/20/2019   IR BILIARY STENT(S) EXISTING ACCESS INC DILATION CATH EXCHANGE  09/20/2021   IR ENDOLUMINAL BX OF BILIARY TREE  07/22/2021   IR GENERIC HISTORICAL  10/28/2016   IR RADIOLOGIST EVAL & MGMT 10/28/2016 [facility]-INTERV RAD   IR INT EXT BILIARY DRAIN WITH CHOLANGIOGRAM  07/22/2021   JOINT REPLACEMENT     LAPAROSCOPY N/A 11/15/2021   Procedure: LAPAROSCOPIC DIAGNOSTIC STAGING;  Surgeon: Fritzi Mandes, MD;  Location: [facility] OR;  Service: General;  Laterality: N/A;   PORTACATH PLACEMENT N/A 08/19/2021   Procedure: INSERTION PORT-A-CATH;  Surgeon: Fritzi Mandes, MD;  Location: [facility] ORS;  Service: General;  Laterality: N/A;  LMA   RADIOLOGY WITH ANESTHESIA N/A 10/21/2015   Procedure: RADIOLOGY WITH ANESTHESIA;  Surgeon: Julieanne Cotton, MD;  Location: [facility] OR;  Service: Radiology;  Laterality: N/A;   RADIOLOGY WITH ANESTHESIA N/A 02/20/2019   Procedure: Sharman Crate;  Surgeon: Julieanne Cotton, MD;  Location: [facility] OR;  Service: Radiology;  Laterality: N/A;   ROTATOR CUFF REPAIR Bilateral    VARICOSE VEIN SURGERY     WHIPPLE PROCEDURE N/A 11/15/2021   Procedure:  WHIPPLE PROCEDURE;  Surgeon: Fritzi Mandes, MD;  Location: [facility] OR;  Service: General;  Laterality: N/A;    Current Medications: Current Outpatient Medications on File Prior to Visit  Medication Sig   amLODipine (NORVASC) 5 MG tablet TAKE 1 TABLET (5 MG TOTAL) BY MOUTH DAILY.   atorvastatin (LIPITOR) 40 MG tablet Take 1 tablet (40 mg total) by mouth at bedtime.   Cholecalciferol (VITAMIN D3) 50 MCG (2000 UT) TABS Take 2,000 Units by mouth daily.   eltrombopag (PROMACTA) 50 MG tablet Take 1 tablet (50 mg total) by mouth daily. Take on an empty stomach 1 hour before a meal or 2 hours after   escitalopram (LEXAPRO) 20 MG tablet Take 1 tablet (20 mg total) by mouth daily.   levothyroxine (SYNTHROID) 112 MCG tablet Take 1 tablet (112 mcg total) by mouth daily before breakfast.   lidocaine-prilocaine (EMLA) cream Apply topically to affected area(s) as needed. (Patient taking differently: Apply 1 Application topically as needed (port).)   lipase/protease/amylase (CREON) 36000 UNITS CPEP capsule Take 1 capsule (36,000  Units total) by mouth 3 (three) times daily before meals. (Patient taking differently: Take 72,000 Units by mouth 3 (three) times daily before meals.)   loperamide (IMODIUM) 2 MG capsule Take 1 capsule (2 mg total) by mouth 4 (four) times daily as needed for diarrhea or loose stools.   megestrol (MEGACE ES) 625 MG/5ML suspension Take 5 mLs (625 mg total) by mouth daily.   mirtazapine (REMERON) 7.5 MG tablet Take 1 tablet (7.5 mg total) by mouth at bedtime.   pantoprazole (PROTONIX) 40 MG tablet Take 1 tablet (40 mg total) by mouth 2 (two) times daily.   prochlorperazine (COMPAZINE) 10 MG tablet Take 1 tablet (10 mg total) by mouth every 6 (six) hours as needed (Nausea or vomiting).   vitamin B-12 (CYANOCOBALAMIN) 1000 MCG tablet Take 1,000 mcg by mouth 2 (two) times daily.   No current facility-administered medications on file prior to visit.     Allergies:   Patient has no known  allergies.   Social History   Tobacco Use   Smoking status: Former    Packs/day: 0.25    Years: 1.00    Additional pack years: 0.00    Total pack years: 0.25    Types: Cigarettes    Quit date: 08/15/1961    Years since quitting: 61.4   Smokeless tobacco: Never  Vaping Use   Vaping Use: Never used  Substance Use Topics   Alcohol use: No   Drug use: No    Family History: family history includes Alcoholism in his sister; Breast cancer in his mother; Cirrhosis in his sister; Lung cancer in his father. There is no history of Colon cancer, Esophageal cancer, Liver cancer, Pancreatic cancer, or Prostate cancer.  ROS:   Please see the history of present illness.   (+) Dizziness (+) LE edema Additional pertinent ROS otherwise unremarkable.  EKGs/Labs/Other Studies Reviewed:    The following studies were reviewed today:  CT Chest/Abdomen/Pelvis  12/15/2022: IMPRESSION: 1. The combination of lack of IV contrast and paucity of abdominal fat severely limit post Whipple anatomy evaluation. The suspicious nodule or node within the abdominal retroperitoneum is not well evaluated. Equivocal soft tissue fullness in this region could alternatively be unopacified small bowel. If the patient cannot receive IV contrast, consider further evaluation with abdominal MRI. 2. No acute process or evidence of metastatic disease in the chest/pelvis. 3. Diffuse colitis, likely related to the clinical history of recent C difficile. 4. Esophageal air fluid level suggests dysmotility or gastroesophageal reflux. 5. Coronary artery atherosclerosis. Aortic Atherosclerosis (ICD10-I70.0). 6. New or increased mild right lower lobe ground-glass opacity. Mild atypical infection could have this appearance.  Echo  09/17/2022: Sonographer Comments: Suboptimal parasternal window. Image acquisition  challenging due to patient body habitus and Image acquisition challenging  due to respiratory motion.    IMPRESSIONS   1. Left ventricular ejection fraction, by estimation, is 60 to 65%. The  left ventricle has normal function. The left ventricle has no regional  wall motion abnormalities. Left ventricular diastolic parameters are  consistent with Grade I diastolic  dysfunction (impaired relaxation).   2. Right ventricular systolic function is normal. The right ventricular  size is normal. There is normal pulmonary artery systolic pressure. The  estimated right ventricular systolic pressure is 28.4 mmHg.   3. The mitral valve is abnormal. Trivial mitral valve regurgitation.   4. The aortic valve is tricuspid. Aortic valve regurgitation is not  visualized.   5. The inferior vena cava is normal in size with  greater than 50%  respiratory variability, suggesting right atrial pressure of 3 mmHg.   6. Agitated saline contrast bubble study was negative, with no evidence  of any interatrial shunt.   Comparison(s): Changes from prior study are noted. 08/28/2021: LVEF 60-65%,  grade 1 DD, mild biatrial enlargment.   MRA  Head/Neck  09/16/2022: IMPRESSION: 1. 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 No acute hemorrhage or mass effect. 2. 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 3. No emergent large vessel occlusion or high-grade stenosis. 4. Moderate narrowing of the midportion of the left vertebral artery V2 segment.  Right UE Venous Doppler 06/16/2022: Summary:    Right:  No evidence of deep vein thrombosis in the upper extremity. No evidence of  superficial vein thrombosis in the upper extremity.    Left:  No evidence of thrombosis in the subclavian.   CT Chest/Abdomen/Pelvis  05/03/2022: IMPRESSION: 1. Interval Whipple procedure, without findings of residual/recurrent or metastatic disease. 2. Esophageal air fluid level suggests dysmotility or gastroesophageal reflux. 3. Coronary artery atherosclerosis. Aortic Atherosclerosis (ICD10-I70.0). 4.  Possible constipation.  Echo  08/28/21  1. Left ventricular ejection fraction, by estimation, is 60 to 65%. The  left ventricle has normal function. The left ventricle has no regional  wall motion abnormalities. Left ventricular diastolic parameters are  consistent with Grade I diastolic  dysfunction (impaired relaxation).   2. Right ventricular systolic function is normal. The right ventricular  size is normal. There is normal pulmonary artery systolic pressure.   3. Left atrial size was mildly dilated.   4. Right atrial size was mildly dilated.   5. The mitral valve is grossly normal. Trivial mitral valve  regurgitation.   6. The aortic valve is tricuspid. There is mild calcification of the  aortic valve. There is mild thickening of the aortic valve. Aortic valve  regurgitation is not visualized. Aortic valve sclerosis/calcification is  present, without any evidence of  aortic stenosis.   7. The inferior vena cava is normal in size with greater than 50%  respiratory variability, suggesting right atrial pressure of 3 mmHg.   Comparison(s): No prior Echocardiogram.  Lower Venous DVT 07/28/20 RIGHT:  - Findings consistent with age indeterminate deep vein thrombosis  involving the right popliteal vein.  - No cystic structure found in the popliteal fossa.     LEFT:  - There is no evidence of deep vein thrombosis in the lower extremity.  - No cystic structure found in the popliteal fossa.  EKG:  EKG is personally reviewed.   01/03/2023:  EKG was not ordered. 06/29/2022:  NSR at 70 bpm 09/29/21: sinus bradycardia at 55 bpm  Recent Labs: 12/12/2022: Magnesium 2.1 12/20/2022: ALT 29; BUN 14; Creatinine 1.49; Hemoglobin 9.9; Platelet Count 269; Potassium 5.8; Sodium 135   Recent Lipid Panel    Component Value Date/Time   CHOL 130 09/17/2022 0258   TRIG 49 09/17/2022 0258   TRIG 150 (H) 07/11/2006 0731   HDL 59 09/17/2022 0258   CHOLHDL 2.2 09/17/2022 0258   VLDL 10 09/17/2022 0258   LDLCALC 61 09/17/2022 0258    LDLDIRECT 149.7 11/07/2008 0000    Physical Exam:    VS:  BP (!) 154/74 (BP Location: Right Arm, Patient Position: Sitting, Cuff Size: Normal)   Pulse 67   Ht 5\' 10"  (1.778 m)   Wt 124 lb 3.2 oz (56.3 kg)   SpO2 98%   BMI 17.82 kg/m     Wt Readings from Last 3 Encounters:  01/03/23 124 lb  3.2 oz (56.3 kg)  01/02/23 125 lb 1.9 oz (56.8 kg)  12/20/22 116 lb 8 oz (52.8 kg)    GEN: Frail appearing, thin elderly gentleman with temporal muscle wasting, in no acute distress HEENT: Normal, moist mucous membranes NECK: No JVD CARDIAC: regular rhythm, normal S1 and S2, no rubs or gallops. No murmur. VASCULAR: Radial and DP pulses 2+ bilaterally. No carotid bruits RESPIRATORY:  Clear to auscultation without rales, wheezing or rhonchi  ABDOMEN: Soft, non-tender, non-distended MUSCULOSKELETAL:  Ambulates independently SKIN: Warm and dry, trivial bl LE edema NEUROLOGIC:  Alert and oriented x 3. No focal neuro deficits noted. PSYCHIATRIC:  Normal affect    ASSESSMENT:    1. Generalized edema   2. Paroxysmal atrial fibrillation (HCC)   3. Pancreatic adenocarcinoma (HCC)   4. Essential hypertension   5. Aortic atherosclerosis (HCC)     PLAN:    Generalized edema, shortness of breath -no JVD, nonpitting, no rales on exam -EF 60-65%, RAP 3, suggests noncardiac  Paroxysmal atrial fibrillation, in the setting of sepsis/bacteremia -has not been told previously that he has any rhythm issues -no longer on apixaban, unclear to me when this was stopped but as he has required blood transfusion, this is reasonable -CHA2DS2/VAS Stroke Risk Points= 3  -recommended stopping aspirin given that this does not prevent stroke in afib and he has had anemia and GI surgery -given that he has only had afib in the setting of sepsis, would be reasonable to allow him to continue to heal, then recheck monitor to see if he has any afib. If none, would consider not restarting anticoagulation. Will discuss  again at follow up  Hypertension -elevated today but typically well controlled.  -restarting amlodipine. 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 Just completed his last chemo treatment  Aortic atherosclerosis: seen on CT -on lovastatin -stop aspirin given anemia  Plan for follow up: 6 months or sooner as needed  Jodelle Red, MD, PhD, [facility]  [facility]    Patient Instructions  Stop aspirin for now--blood counts are still on the low side, so there is more risk to bleeding.  Restart amlodipine 5 mg daily, watch for worsening swelling in the feet.  Follow up in 6 months    I,Mathew Stumpf,acting as a scribe for [employer], MD.,have documented all relevant documentation on the behalf of Jodelle Red, MD,as directed by  Jodelle Red, MD while in the presence of Jodelle Red, MD.  I, Jodelle Red, MD, have reviewed all documentation for this visit. The documentation on 01/03/23 for the exam, diagnosis, procedures, and orders are all accurate and complete.   Signed, Jodelle Red, MD PhD 01/03/2023     [facility]

## 2023-01-03 ENCOUNTER — Other Ambulatory Visit: Payer: Self-pay

## 2023-01-03 ENCOUNTER — Encounter (HOSPITAL_BASED_OUTPATIENT_CLINIC_OR_DEPARTMENT_OTHER): Payer: Self-pay | Admitting: Cardiology

## 2023-01-03 ENCOUNTER — Ambulatory Visit (HOSPITAL_BASED_OUTPATIENT_CLINIC_OR_DEPARTMENT_OTHER): Payer: Medicare HMO | Admitting: Cardiology

## 2023-01-03 VITALS — BP 154/74 | HR 67 | Ht 70.0 in | Wt 124.2 lb

## 2023-01-03 DIAGNOSIS — R601 Generalized edema: Secondary | ICD-10-CM | POA: Diagnosis not present

## 2023-01-03 DIAGNOSIS — I700 Atherosclerosis of aorta: Secondary | ICD-10-CM | POA: Diagnosis not present

## 2023-01-03 DIAGNOSIS — I480 Paroxysmal atrial fibrillation: Secondary | ICD-10-CM

## 2023-01-03 DIAGNOSIS — C259 Malignant neoplasm of pancreas, unspecified: Secondary | ICD-10-CM | POA: Diagnosis not present

## 2023-01-03 DIAGNOSIS — I10 Essential (primary) hypertension: Secondary | ICD-10-CM | POA: Diagnosis not present

## 2023-01-03 NOTE — Patient Instructions (Addendum)
Stop aspirin for now--blood counts are still on the low side, so there is more risk to bleeding.  Restart amlodipine 5 mg daily, watch for worsening swelling in the feet.  Follow up in 6 months

## 2023-01-04 ENCOUNTER — Other Ambulatory Visit: Payer: Self-pay

## 2023-01-05 ENCOUNTER — Ambulatory Visit: Payer: Medicare HMO | Admitting: Hematology

## 2023-01-05 ENCOUNTER — Other Ambulatory Visit: Payer: Medicare HMO

## 2023-01-05 ENCOUNTER — Other Ambulatory Visit (HOSPITAL_COMMUNITY): Payer: Self-pay

## 2023-01-06 DIAGNOSIS — R630 Anorexia: Secondary | ICD-10-CM | POA: Insufficient documentation

## 2023-01-09 NOTE — Assessment & Plan Note (Addendum)
Blood pressure elevated today. -Cardiologist recently lowered blood pressure medication dosages. -Has appointment later this week with cardiology.

## 2023-01-09 NOTE — Assessment & Plan Note (Signed)
Thyroid panel stable. -.Continue current medication.

## 2023-01-09 NOTE — Assessment & Plan Note (Signed)
Patient may take pantoprazole twice daily.  -changed prescription to reflect higher dose.  -encouraged him to discuss with oncology/GI provider

## 2023-01-09 NOTE — Assessment & Plan Note (Signed)
Weight stable.  -conitnue megace daily. New prescription sent to pharmacy today

## 2023-01-09 NOTE — Assessment & Plan Note (Signed)
Likely due to treatment for pancreatic cancer -continue Megace as previously prescribed.  -refill sent to his pharmacy today

## 2023-01-09 NOTE — Assessment & Plan Note (Signed)
The patient should continue regular visits with oncology.

## 2023-01-10 ENCOUNTER — Other Ambulatory Visit (HOSPITAL_COMMUNITY): Payer: Self-pay

## 2023-01-12 ENCOUNTER — Other Ambulatory Visit (HOSPITAL_COMMUNITY): Payer: Self-pay

## 2023-01-12 ENCOUNTER — Encounter (HOSPITAL_COMMUNITY): Payer: Self-pay

## 2023-01-23 ENCOUNTER — Inpatient Hospital Stay: Payer: Medicare HMO | Attending: Physician Assistant | Admitting: Hematology

## 2023-01-23 ENCOUNTER — Encounter: Payer: Self-pay | Admitting: Hematology

## 2023-01-23 ENCOUNTER — Inpatient Hospital Stay: Payer: Medicare HMO

## 2023-01-23 VITALS — BP 183/95 | HR 70 | Temp 97.9°F | Resp 18 | Ht 70.0 in | Wt 115.7 lb

## 2023-01-23 DIAGNOSIS — C251 Malignant neoplasm of body of pancreas: Secondary | ICD-10-CM | POA: Insufficient documentation

## 2023-01-23 DIAGNOSIS — D696 Thrombocytopenia, unspecified: Secondary | ICD-10-CM | POA: Insufficient documentation

## 2023-01-23 DIAGNOSIS — D469 Myelodysplastic syndrome, unspecified: Secondary | ICD-10-CM | POA: Diagnosis not present

## 2023-01-23 DIAGNOSIS — C250 Malignant neoplasm of head of pancreas: Secondary | ICD-10-CM

## 2023-01-23 DIAGNOSIS — Z95828 Presence of other vascular implants and grafts: Secondary | ICD-10-CM

## 2023-01-23 LAB — CMP (CANCER CENTER ONLY)
ALT: 29 U/L (ref 0–44)
AST: 48 U/L — ABNORMAL HIGH (ref 15–41)
Albumin: 3.9 g/dL (ref 3.5–5.0)
Alkaline Phosphatase: 79 U/L (ref 38–126)
Anion gap: 7 (ref 5–15)
BUN: 29 mg/dL — ABNORMAL HIGH (ref 8–23)
CO2: 25 mmol/L (ref 22–32)
Calcium: 9.2 mg/dL (ref 8.9–10.3)
Chloride: 107 mmol/L (ref 98–111)
Creatinine: 1.39 mg/dL — ABNORMAL HIGH (ref 0.61–1.24)
GFR, Estimated: 50 mL/min — ABNORMAL LOW (ref >60)
Glucose, Bld: 95 mg/dL (ref 70–99)
Potassium: 3.9 mmol/L (ref 3.5–5.1)
Sodium: 139 mmol/L (ref 135–145)
Total Bilirubin: 0.5 mg/dL (ref 0.3–1.2)
Total Protein: 7.6 g/dL (ref 6.5–8.1)

## 2023-01-23 LAB — CBC WITH DIFFERENTIAL (CANCER CENTER ONLY)
Abs Immature Granulocytes: 0.02 10*3/uL (ref 0.00–0.07)
Basophils Absolute: 0 10*3/uL (ref 0.0–0.1)
Basophils Relative: 1 %
Eosinophils Absolute: 0.1 10*3/uL (ref 0.0–0.5)
Eosinophils Relative: 1 %
HCT: 28.4 % — ABNORMAL LOW (ref 39.0–52.0)
Hemoglobin: 9.4 g/dL — ABNORMAL LOW (ref 13.0–17.0)
Immature Granulocytes: 0 %
Lymphocytes Relative: 41 %
Lymphs Abs: 2.2 10*3/uL (ref 0.7–4.0)
MCH: 27.6 pg (ref 26.0–34.0)
MCHC: 33.1 g/dL (ref 30.0–36.0)
MCV: 83.3 fL (ref 80.0–100.0)
Monocytes Absolute: 0.5 10*3/uL (ref 0.1–1.0)
Monocytes Relative: 9 %
Neutro Abs: 2.6 10*3/uL (ref 1.7–7.7)
Neutrophils Relative %: 48 %
Platelet Count: 157 10*3/uL (ref 150–400)
RBC: 3.41 MIL/uL — ABNORMAL LOW (ref 4.22–5.81)
RDW: 17.3 % — ABNORMAL HIGH (ref 11.5–15.5)
WBC Count: 5.4 10*3/uL (ref 4.0–10.5)
nRBC: 0 % (ref 0.0–0.2)

## 2023-01-23 MED ORDER — HEPARIN SOD (PORK) LOCK FLUSH 100 UNIT/ML IV SOLN
500.0000 [IU] | Freq: Once | INTRAVENOUS | Status: AC | PRN
  Administered 2023-01-23: 500 [IU]

## 2023-01-23 MED ORDER — SODIUM CHLORIDE 0.9% FLUSH
10.0000 mL | INTRAVENOUS | Status: DC | PRN
  Administered 2023-01-23: 10 mL

## 2023-01-23 NOTE — Assessment & Plan Note (Addendum)
-  Secondary to MDS -he was on Promacta, will give a platelet count in 100-200 range due to his recent stroke -Promacta stopped in April 2024 due to normal plt

## 2023-01-23 NOTE — Assessment & Plan Note (Signed)
developed worsening thrombocytopenia in December 2023, s/p platelet transfusion.  -Bone marrow biopsy 07/28/22 showed hypercellular marrow and megakaryocytes dyspoiesis, which supports MDS.  Biopsy was negative for metastatic adenocarcinoma.  -cytogenetics and MDS FISH were normal  -he has responded well to Promacta -Due to his recent stroke, will keep Promacta at low-dose, and keep platelet count in 100-200 range.  Promacta was held when he had a ED visit for diarrhea on 11/28/2021. Due to normal plt, I have not restarted promacta yet.

## 2023-01-23 NOTE — Progress Notes (Signed)
[facility]   [phone] Fax:([phone]   Clinic Follow up Note   Patient Care Team: Carlean Jews, NP as PCP - General (Family Medicine) Jodelle Red, MD as PCP - Cardiology (Cardiology) Malachy Mood, MD as Consulting Physician (Oncology) Marily Lente, RN as Oncology Nurse Navigator (Oncology)  Date of Service:  01/23/2023  CHIEF COMPLAINT: f/u of  pancreatic cancer and MDS   CURRENT THERAPY:  Pancreatic cancer: s/p neoadjuvant chemotherapy, whipple 11/15/21, adjuvant chemo 06/18/22, and SBRT to oligo metastatic disease completed 09/30/22. Now on observation  MDS:  was on Promacta, held since 11/29/2022    ASSESSMENT:  Angel Denn. is a 82 y.o. male with   Pancreatic cancer (HCC)  cT2N0M0, stage IB, ypT2N0  -diagnosed in 07/2021, s/p neoadjuvant chemo gemcitabine alone on 08/20/21. Abraxane was added with cycle 2 (09/17/21), but he tolerated poorly and discontinued after first dose.  -s/p whipple 11/15/21 with Dr. Freida Busman. 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 Margins and lymph nodes negative. -s/p adjuvant gemcitabine 01/20/22 - 06/18/2022 -restaging CT from 08/17/22 showed small soft tissue nodule or lymph node interposed between the anterior aorta, portal vein, left renal vein, and superior mesenteric artery measuring 1.3 x 0.8 cm. This is new or at least significantly increased compared to prior CT 05/03/2022 and suspicious for local recurrence or nodal metastasis -unfortunately biopsy is not feasible due to location  -S/p SBRT, completed 09/30/2022 -Surveillance CT scan from Dec 15, 2022 showed no definitive evidence of recurrence, however the scan quality is limited due to lack of IV contrast, especially in the surgical site.  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  -Bone marrow biopsy 07/28/22 showed hypercellular marrow and megakaryocytes dyspoiesis, which supports MDS.  Biopsy was negative for metastatic adenocarcinoma.  -cytogenetics and MDS FISH were normal  -he has responded well to Promacta -Due to his recent stroke, will keep Promacta at low-dose, and keep platelet count in 100-200 range.  Promacta was held when he had a ED visit for diarrhea on 11/28/2021. Due to normal plt, I have not restarted promacta yet.     PLAN: -lab reviewed - I order Liver MRI in 3 weeks -lab and f/u in 3 weeks, after MRI   SUMMARY OF ONCOLOGIC HISTORY: Oncology History Overview Note   Cancer Staging  Pancreatic cancer [facility]) Staging form: Exocrine Pancreas, AJCC 8th Edition - Clinical stage from 08/05/2021: Stage IB (cT2, cN0, cM0) - Signed by Malachy Mood, MD on 08/17/2021 Stage prefix: Initial diagnosis Total positive nodes: 0     Pancreatic mass  07/18/2021 Initial Diagnosis   Pancreatic mass   07/18/2021 Imaging   CT Abdomen Pelvis W Contrast  IMPRESSION: 1. Suspected solid mass within the proximal body of the pancreas measures 2.3 x 2.6 x 2.0 cm. There is downstream dilation of the main pancreatic duct and its branches. There is also intra and extrahepatic biliary ductal dilation. Further evaluation with MRI of the abdomen, pancreatic protocol, when clinically feasible, may be considered. 2. 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 3. Aortic atherosclerosis   07/20/2021 Procedure   DG ERCP  IMPRESSION: Nondiagnostic ERCP  as above. Correlation with the operative report is advised.  - The examination was suspicious for a biliary stricture in the bile duct. - Examination was suspicious for carcinoma of the head of the pancreas. -  Attempts at a cholangiogram failed.   07/22/2021 Pathology Results   CASE: [id number]   FINAL MICROSCOPIC DIAGNOSIS:  - Suspicious for malignancy  - See comment   DIAGNOSTIC COMMENTS:  There are rare cells with cytologic atypia suspicious for  adenocarcinoma.  Dr. Kenard Gower agrees.    07/22/2021 Procedure   IR INT EXT BILIARY DRAIN WITH CHOLANGIOGRAM ( IMPRESSION: 1. 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 2. Successful brush biopsy of obstructing lesion x3. 3. Successful placement of a 10 French internal/external biliary drainage catheter.   PLAN: 1. Follow bilirubin. When significant downward trend is clear, the bag should be capped. Recommend capping bag before discharge home if possible. 2. Follow-up in IR in 4-6 weeks for initial biliary tube check and exchange. If initial brush biopsies are negative, repeat biopsy could be considered at that time.   Pancreatic cancer (HCC)  08/05/2021 Cancer Staging   Staging form: Exocrine Pancreas, AJCC 8th Edition - Clinical stage from 08/05/2021: Stage IB (cT2, cN0, cM0) - Signed by Malachy Mood, MD on 08/17/2021 Stage prefix: Initial diagnosis Total positive nodes: 0   08/14/2021 Initial Diagnosis   Pancreatic cancer (HCC)   08/20/2021 - 03/23/2022 Chemotherapy   Patient is on Treatment Plan : PANCREATIC Abraxane / Gemcitabine D1,8 q21d     08/20/2021 -  Chemotherapy   Patient is on Treatment Plan : PANCREAS Gemcitabine D1,8 (1000) q21d x 8 Cycles     11/15/2021 Cancer Staging   Staging form: Exocrine Pancreas, AJCC 8th Edition - Pathologic stage from 11/15/2021: Stage IB (ypT2, pN0, cM0) - Signed by Malachy Mood, MD on 01/03/2022 Stage prefix: Post-therapy Total positive nodes: 0 Histologic grade (G): G3 Histologic grading system: 3 grade system Residual tumor (R): R0 - None      INTERVAL HISTORY:  Angel Frederic Sr. is here for a follow up of  pancreatic cancer and MDS  . He was last seen by me on 12/20/2022. He presents to the clinic accompanied by daughter. Pt state that he is eating well and he is having some lower back pain, but its unrelated. Pt state that he has a stomach ache  because he is nervous. Pt daughter state that he doesn't walk outside and but he does in the house. Pt state that he does exercise at home and he get dizzy and its being going on for a while.      All other systems were reviewed with the patient and are negative.  MEDICAL HISTORY:  Past Medical History:  Diagnosis Date   Anxiety    Arthritis    Depression    Family history of breast cancer    Family history of lung cancer    Headache    HLD (hyperlipidemia)    Hx of inguinal hernia repair    Hypertension    Hypothyroidism    Nausea with vomiting 09/21/2021   Overactive bladder    Pancreatic cancer (HCC)    Pneumonia    Shingles    Sleep apnea    Patient denies   Thoracic ascending aortic aneurysm (HCC)    mildly dilated 4.0cm per 08/12/21 CT   Thyroid disease    Varicose veins of both lower extremities     SURGICAL HISTORY: Past Surgical History:  Procedure Laterality Date   BILATERAL CARPAL TUNNEL RELEASE     CATARACT EXTRACTION, BILATERAL Bilateral    ELBOW SURGERY Bilateral    ENDOSCOPIC RETROGRADE CHOLANGIOPANCREATOGRAPHY (ERCP) WITH PROPOFOL N/A 07/20/2021   Procedure: ENDOSCOPIC RETROGRADE CHOLANGIOPANCREATOGRAPHY (ERCP) WITH PROPOFOL;  Surgeon: Iva Boop, MD;  Location: [facility] ENDOSCOPY;  Service: Endoscopy;  Laterality: N/A;   ESOPHAGOGASTRODUODENOSCOPY (EGD) WITH PROPOFOL N/A 08/05/2021   Procedure: ESOPHAGOGASTRODUODENOSCOPY (EGD) WITH PROPOFOL;  Surgeon: Meridee Score Netty Starring., MD;  Location: [facility] ENDOSCOPY;  Service: Gastroenterology;  Laterality: N/A;   EUS N/A 08/05/2021   Procedure: UPPER ENDOSCOPIC ULTRASOUND (EUS) RADIAL;  Surgeon: Lemar Lofty., MD;  Location: [facility] ENDOSCOPY;  Service: Gastroenterology;  Laterality: N/A;   EYE  SURGERY     bilateral cataracts   FINE NEEDLE ASPIRATION  08/05/2021   Procedure: FINE NEEDLE ASPIRATION (FNA) LINEAR;  Surgeon: Lemar Lofty., MD;  Location: [facility] ENDOSCOPY;  Service: Gastroenterology;;   INGUINAL HERNIA REPAIR Right 1952   IR ANGIO INTRA EXTRACRAN SEL COM CAROTID INNOMINATE BILAT MOD SED  02/20/2019   IR BILIARY STENT(S) EXISTING ACCESS INC DILATION CATH EXCHANGE  09/20/2021   IR ENDOLUMINAL BX OF BILIARY TREE  07/22/2021   IR GENERIC HISTORICAL  10/28/2016   IR RADIOLOGIST EVAL & MGMT 10/28/2016 [facility]-INTERV RAD   IR INT EXT BILIARY DRAIN WITH CHOLANGIOGRAM  07/22/2021   JOINT REPLACEMENT     LAPAROSCOPY N/A 11/15/2021   Procedure: LAPAROSCOPIC DIAGNOSTIC STAGING;  Surgeon: Fritzi Mandes, MD;  Location[facility] OR;  Service: General;  Laterality: N/A;   PORTACATH PLACEMENT N/A 08/19/2021   Procedure: INSERTION PORT-A-CATH;  Surgeon: Fritzi Mandes, MD;  Location: [facility] ORS;  Service: General;  Laterality: N/A;  LMA   RADIOLOGY WITH ANESTHESIA N/A 10/21/2015   Procedure: RADIOLOGY WITH ANESTHESIA;  Surgeon: Julieanne Cotton, MD;  Location: [facility] OR;  Service: Radiology;  Laterality: N/A;   RADIOLOGY WITH ANESTHESIA N/A 02/20/2019   Procedure: Sharman Crate;  Surgeon: Julieanne Cotton, MD;  Location: [facility] OR;  Service: Radiology;  Laterality: N/A;   ROTATOR CUFF REPAIR Bilateral    VARICOSE VEIN SURGERY     WHIPPLE PROCEDURE N/A 11/15/2021   Procedure: WHIPPLE PROCEDURE;  Surgeon: Fritzi Mandes, MD;  Location: [facility] OR;  Service: General;  Laterality: N/A;    I have reviewed the social history and family history with the patient and they are unchanged from previous note.  ALLERGIES:  has No Known Allergies.  MEDICATIONS:  Current Outpatient Medications  Medication Sig Dispense Refill   amLODipine (NORVASC) 5 MG tablet TAKE 1 TABLET (5 MG TOTAL) BY MOUTH DAILY. 90 tablet 1   atorvastatin (LIPITOR) 40 MG tablet Take 1 tablet (40 mg total) by mouth at bedtime. 30 tablet 3    Cholecalciferol (VITAMIN D3) 50 MCG (2000 UT) TABS Take 2,000 Units by mouth daily.     eltrombopag (PROMACTA) 50 MG tablet Take 1 tablet (50 mg total) by mouth daily. Take on an empty stomach 1 hour before a meal or 2 hours after 30 tablet 2   escitalopram (LEXAPRO) 20 MG tablet Take 1 tablet (20 mg total) by mouth daily. 90 tablet 1   levothyroxine (SYNTHROID) 112 MCG tablet Take 1 tablet (112 mcg total) by mouth daily before breakfast. 90 tablet 1   lidocaine-prilocaine (EMLA) cream Apply topically to affected area(s) as needed. (Patient taking differently: Apply 1 Application topically as needed (port).) 30 g 0   lipase/protease/amylase (CREON) 36000 UNITS CPEP capsule Take 1 capsule (36,000 Units total) by mouth  3 (three) times daily before meals. (Patient taking differently: Take 72,000 Units by mouth 3 (three) times daily before meals.) 180 capsule 3   loperamide (IMODIUM) 2 MG capsule Take 1 capsule (2 mg total) by mouth 4 (four) times daily as needed for diarrhea or loose stools. 12 capsule 0   megestrol (MEGACE ES) 625 MG/5ML suspension Take 5 mLs (625 mg total) by mouth daily. 150 mL 2   mirtazapine (REMERON) 7.5 MG tablet Take 1 tablet (7.5 mg total) by mouth at bedtime. 90 tablet 1   pantoprazole (PROTONIX) 40 MG tablet Take 1 tablet (40 mg total) by mouth 2 (two) times daily. 180 tablet 1   prochlorperazine (COMPAZINE) 10 MG tablet Take 1 tablet (10 mg total) by mouth every 6 (six) hours as needed (Nausea or vomiting). 30 tablet 1   vitamin B-12 (CYANOCOBALAMIN) 1000 MCG tablet Take 1,000 mcg by mouth 2 (two) times daily.     No current facility-administered medications for this visit.    PHYSICAL EXAMINATION: ECOG PERFORMANCE STATUS: 2 - Symptomatic, <50% confined to bed  Vitals:   01/23/23 1421  BP: (!) 183/95  Pulse: 70  Resp: 18  Temp: 97.9 F (36.6 C)  SpO2: 99%   Wt Readings from Last 3 Encounters:  01/23/23 115 lb 11.2 oz (52.5 kg)  01/03/23 124 lb 3.2 oz (56.3 kg)   01/02/23 125 lb 1.9 oz (56.8 kg)   GENERAL:alert, no distress and comfortable SKIN: skin color normal, no rashes or significant lesions EYES: normal, Conjunctiva are pink and non-injected, sclera clear  NEURO: alert & oriented x 3 with fluent speech  LABORATORY DATA:  I have reviewed the data as listed    Latest Ref Rng & Units 01/23/2023    1:48 PM 12/20/2022   11:58 AM 12/12/2022   11:27 AM  CBC  WBC 4.0 - 10.5 K/uL 5.4  6.3  8.1   Hemoglobin 13.0 - 17.0 g/dL 9.4  9.9  16.1   Hematocrit 39.0 - 52.0 % 28.4  29.8  32.0   Platelets 150 - 400 K/uL 157  269  441         Latest Ref Rng & Units 01/23/2023    1:48 PM 12/20/2022   11:58 AM 12/15/2022    8:27 AM  CMP  Glucose 70 - 99 mg/dL 95  98  89   BUN 8 - 23 mg/dL 29  14  13    Creatinine 0.61 - 1.24 mg/dL 0.96  0.45  4.09   Sodium 135 - 145 mmol/L 139  135  140   Potassium 3.5 - 5.1 mmol/L 3.9  5.8  3.1   Chloride 98 - 111 mmol/L 107  104  105   CO2 22 - 32 mmol/L 25  26  29    Calcium 8.9 - 10.3 mg/dL 9.2  8.3  8.2   Total Protein 6.5 - 8.1 g/dL 7.6  6.0  5.8   Total Bilirubin 0.3 - 1.2 mg/dL 0.5  0.4  1.4   Alkaline Phos 38 - 126 U/L 79  83  71   AST 15 - 41 U/L 48  76  33   ALT 0 - 44 U/L 29  29  14        RADIOGRAPHIC STUDIES: I have personally reviewed the radiological images as listed and agreed with the findings in the report. No results found.    Orders Placed This Encounter  Procedures   MR Abdomen W Wo Contrast    Standing Status:  Future    Standing Expiration Date:   01/23/2024    Order Specific Question:   If indicated for the ordered procedure, I authorize the administration of contrast media per Radiology protocol    Answer:   Yes    Order Specific Question:   What is the patient's sedation requirement?    Answer:   No Sedation    Order Specific Question:   Does the patient have a pacemaker or implanted devices?    Answer:   No    Order Specific Question:   Preferred imaging location?    Answer:    [facility] (table limit - 550 lbs)    Order Specific Question:   Release to patient    Answer:   Immediate   All questions were answered. The patient knows to call the clinic with any problems, questions or concerns. No barriers to learning was detected. The total time spent in the appointment was 25 minutes.     Malachy Mood, MD 01/23/2023   Carolin Coy, CMA, am acting as scribe for Malachy Mood, MD.   I have reviewed the above documentation for accuracy and completeness, and I agree with the above.

## 2023-01-23 NOTE — Assessment & Plan Note (Signed)
cT2N0M0, stage IB, ypT2N0  -diagnosed in 07/2021, s/p neoadjuvant chemo gemcitabine alone on 08/20/21. Abraxane was added with cycle 2 (09/17/21), but he tolerated poorly and discontinued after first dose.  -s/p whipple 11/15/21 with Dr. Freida Busman. 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 Margins and lymph nodes negative. -s/p adjuvant gemcitabine 01/20/22 - 06/18/2022 -restaging CT from 08/17/22 showed small soft tissue nodule or lymph node interposed between the anterior aorta, portal vein, left renal vein, and superior mesenteric artery measuring 1.3 x 0.8 cm. This is new or at least significantly increased compared to prior CT 05/03/2022 and suspicious for local recurrence or nodal metastasis -unfortunately biopsy is not feasible due to location  -S/p SBRT, completed 09/30/2022 -Surveillance CT scan from Dec 15, 2022 showed no definitive evidence of recurrence, however the skin quality is limited due to lack of IV contrast, especially in the surgical site.  Plan to repeat abdominal MRI with contrast in 2 to 3 months.

## 2023-01-24 ENCOUNTER — Telehealth: Payer: Self-pay | Admitting: Hematology

## 2023-01-24 NOTE — Telephone Encounter (Signed)
Patient is aware of updated timeframe for appointments and also patient will contact to schedule MRI

## 2023-01-25 ENCOUNTER — Other Ambulatory Visit: Payer: Self-pay

## 2023-01-25 ENCOUNTER — Emergency Department (HOSPITAL_COMMUNITY)
Admission: EM | Admit: 2023-01-25 | Discharge: 2023-01-25 | Disposition: A | Discharge status: Home / Self Care | Payer: Medicare HMO | Attending: Emergency Medicine | Admitting: Emergency Medicine

## 2023-01-25 DIAGNOSIS — S51811A Laceration without foreign body of right forearm, initial encounter: Secondary | ICD-10-CM | POA: Diagnosis not present

## 2023-01-25 DIAGNOSIS — Z79899 Other long term (current) drug therapy: Secondary | ICD-10-CM | POA: Diagnosis not present

## 2023-01-25 DIAGNOSIS — S59911A Unspecified injury of right forearm, initial encounter: Secondary | ICD-10-CM | POA: Diagnosis present

## 2023-01-25 DIAGNOSIS — W010XXA Fall on same level from slipping, tripping and stumbling without subsequent striking against object, initial encounter: Secondary | ICD-10-CM | POA: Insufficient documentation

## 2023-01-25 LAB — CANCER ANTIGEN 19-9: CA 19-9: 32 U/mL (ref 0–35)

## 2023-01-25 NOTE — ED Provider Notes (Signed)
[place] EMERGENCY DEPARTMENT AT [facility] Provider Note   CSN: [id number] Arrival date & time: 01/25/23  1658     History Chief Complaint  Patient presents with   Abrasion    Angel Keller. is a 82 y.o. male.  Patient presents to the emergency department following a mechanical fall.  He reports that he sustained an abrasion to the right forearm about 3 days ago after tripping and falling.  Daughter reports that if they were having difficulty getting the bleeding to fully stop as it would start to rebleed with dressing changes.  Denies any significant pain or redness around the abrasion site.  No recent antibiotic use.  Denies being diabetic, but does report that he was recently treated for cancer with chemotherapy and radiation about a few months ago.  From chart review, patient has had evaluations with Dr. Mosetta Putt with oncology as recently as 01/23/2023 and no current schedule chemotherapy sessions.  HPI     Home Medications Prior to Admission medications   Medication Sig Start Date End Date Taking? Authorizing Provider  amLODipine (NORVASC) 5 MG tablet TAKE 1 TABLET (5 MG TOTAL) BY MOUTH DAILY. 11/17/22 05/16/23  Carlean Jews, NP  atorvastatin (LIPITOR) 40 MG tablet Take 1 tablet (40 mg total) by mouth at bedtime. 09/17/22   Osvaldo Shipper, MD  Cholecalciferol (VITAMIN D3) 50 MCG (2000 UT) TABS Take 2,000 Units by mouth daily.    [provider]  eltrombopag (PROMACTA) 50 MG tablet Take 1 tablet (50 mg total) by mouth daily. Take on an empty stomach 1 hour before a meal or 2 hours after 11/11/22   Malachy Mood, MD  escitalopram (LEXAPRO) 20 MG tablet Take 1 tablet (20 mg total) by mouth daily. 09/26/22   Carlean Jews, NP  levothyroxine (SYNTHROID) 112 MCG tablet Take 1 tablet (112 mcg total) by mouth daily before breakfast. 09/26/22   Carlean Jews, NP  lidocaine-prilocaine (EMLA) cream Apply topically to affected area(s) as needed. Patient taking  differently: Apply 1 Application topically as needed (port). 08/24/22   Ronny Bacon, PA-C  lipase/protease/amylase (CREON) 36000 UNITS CPEP capsule Take 1 capsule (36,000 Units total) by mouth 3 (three) times daily before meals. Patient taking differently: Take 72,000 Units by mouth 3 (three) times daily before meals. 08/24/22   Malachy Mood, MD  loperamide (IMODIUM) 2 MG capsule Take 1 capsule (2 mg total) by mouth 4 (four) times daily as needed for diarrhea or loose stools. 11/29/22   Rexford Maus, DO  megestrol (MEGACE ES) 625 MG/5ML suspension Take 5 mLs (625 mg total) by mouth daily. 01/02/23   Carlean Jews, NP  mirtazapine (REMERON) 7.5 MG tablet Take 1 tablet (7.5 mg total) by mouth at bedtime. 11/01/22   Carlean Jews, NP  pantoprazole (PROTONIX) 40 MG tablet Take 1 tablet (40 mg total) by mouth 2 (two) times daily. 01/02/23   Carlean Jews, NP  prochlorperazine (COMPAZINE) 10 MG tablet Take 1 tablet (10 mg total) by mouth every 6 (six) hours as needed (Nausea or vomiting). 06/09/22   Malachy Mood, MD  vitamin B-12 (CYANOCOBALAMIN) 1000 MCG tablet Take 1,000 mcg by mouth 2 (two) times daily.    [provider]      Allergies    Patient has no known allergies.    Review of Systems   Review of Systems  Skin:  Positive for wound.  All other systems reviewed and are negative.   Physical Exam  Updated Vital Signs BP (!) 172/95 (BP Location: Left Arm)   Pulse 64   Temp 98.5 F (36.9 C) (Oral)   Resp 16   Ht 5\' 10"  (1.778 m)   Wt 52 kg   SpO2 97%   BMI 16.45 kg/m  Physical Exam Vitals and nursing note reviewed.  Constitutional:      General: He is not in acute distress.    Appearance: He is well-developed.  HENT:     Head: Normocephalic and atraumatic.  Eyes:     Conjunctiva/sclera: Conjunctivae normal.  Cardiovascular:     Rate and Rhythm: Normal rate and regular rhythm.     Heart sounds: No murmur heard. Pulmonary:     Effort: Pulmonary  effort is normal. No respiratory distress.     Breath sounds: Normal breath sounds.  Abdominal:     Palpations: Abdomen is soft.     Tenderness: There is no abdominal tenderness.  Musculoskeletal:        General: No swelling or tenderness. Normal range of motion.     Cervical back: Neck supple.  Skin:    General: Skin is warm and dry.     Capillary Refill: Capillary refill takes less than 2 seconds.     Findings: Bruising and lesion present.          Comments: Notable skin tear to the right forearm.  Wound is approximately 65 to 79 days old and the wound edges are adhered with inability to tract or reposition for suture repair.  No erythema or drainage.  Not actively bleeding.  Neurological:     Mental Status: He is alert.  Psychiatric:        Mood and Affect: Mood normal.     ED Results / Procedures / Treatments   Labs (all labs ordered are listed, but only abnormal results are displayed) Labs Reviewed - No data to display  EKG None  Radiology No results found.  Procedures Procedures   Medications Ordered in ED Medications - No data to display  ED Course/ Medical Decision Making/ A&P Clinical Course as of 01/27/23 1917  Wed Jan 25, 2023  1750 Right arm skin tear from a GLF.  Too late for primary closure. Wound care. [CC]    Clinical Course User Index [CC] Glyn Ade, MD                           Medical Decision Making  This patient presents to the ED for concern of abrasion.  Differential diagnosis includes skin laceration, ulceration, cellulitis, skin tear   Problem List / ED Course:  Patient presented to the ED with concerns of a skin abrasions. Reports that this abrasion occurred approximately 3 days ago. Patient's family reports that he is not currently on blood thinners, but wound continued to bleed/weep slightly for about 2 days. Denies any concerns for infection such as drainage, redness, warmth, or fevers. Given that this lesion occurred several  days ago and appears that the skin has begun to readhere, laceration repair not indicated. Instead, patient would benefit from outpatient wound care management to ensure the area is healing properly without complications. Patient and family are agreeable to this plan. No evidence of infection so no indication for antibiotics at this point. Ambulatory referral to wound clinic made. Patient advised to return to the ED if symptoms of infection develop. All questions answered prior to patient discharge.   Final Clinical Impression(s) / ED Diagnoses  Final diagnoses:  Skin tear of right forearm without complication, initial encounter    Rx / DC Orders ED Discharge Orders          Ordered    Ambulatory referral to Wound Clinic        01/25/23 1803              Smitty Knudsen, PA-C 01/27/23 1610    Glyn Ade, MD 01/30/23 [phone]

## 2023-01-25 NOTE — ED Triage Notes (Signed)
Pt reports he tripped and fell 2 days ago. Skin tear to right forearm, per daughter bleeds on and off.

## 2023-01-25 NOTE — Discharge Instructions (Signed)
You were seen in the emergency department for a skin abrasion. The skin itself has notable tears in it, but given that this occurred 2-3 days ago and how thin the skin is, repairing the area is not advised at this time. I would instead advise you follow up with wound care clinic to ensure the area is healing well without issues. If there appears to be any development of infection such as pus draining, worsening pain, significant redness, please return to the ER or follow up with your primary care provider for further evaluation.

## 2023-01-27 ENCOUNTER — Other Ambulatory Visit (HOSPITAL_COMMUNITY): Payer: Self-pay

## 2023-01-31 ENCOUNTER — Telehealth: Payer: Self-pay

## 2023-01-31 NOTE — Telephone Encounter (Signed)
Transition Care Management Unsuccessful Follow-up Telephone Call  Date of discharge and from where:  01/25/2023 [facility]  Attempts:  1st Attempt  Reason for unsuccessful TCM follow-up call:  No answer/busy  Kjell Brannen [facility]  [facility] Population Health Community Resource Care Guide   [email or web address]  ?? [phone]   [email or web address]: [email or web address]  [email or web address]

## 2023-02-01 ENCOUNTER — Telehealth: Payer: Self-pay

## 2023-02-01 IMAGING — US IR ENDOLUMNICAL BIOPSY OF BILIARY TREE
1 series · 1 of 1 positions shown · non-contrast
Comparison: none

INDICATION: 80-year-old male with obstructed jaundice and suspected underlying
pancreatic cancer. He presents for percutaneous transhepatic
cholangiogram, percutaneous biliary drain placement and brush biopsy
if possible.

[Series 1: ir endolumnical biopsy of biliary tree · 1 of 1 slices shown]
[im 1/1]
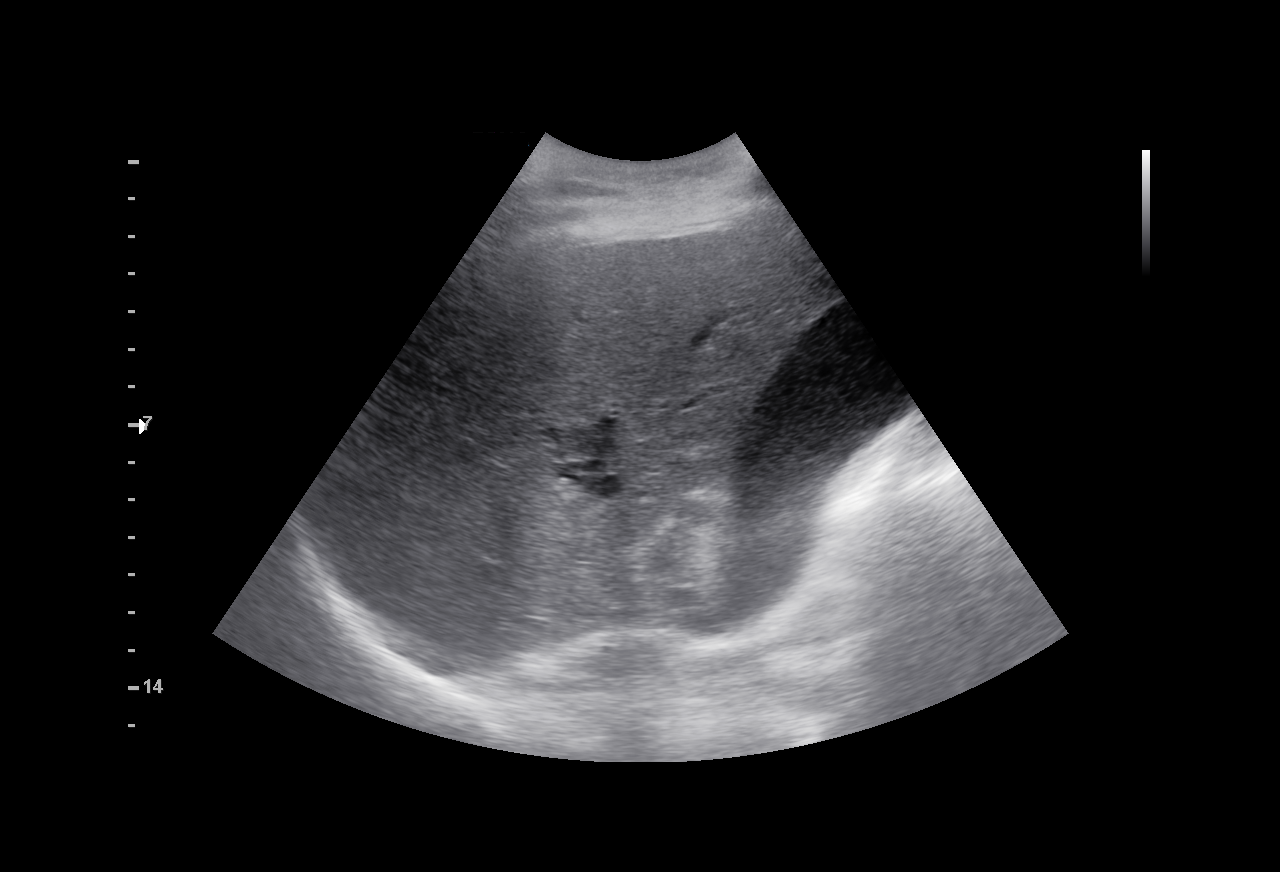

[1 of 1 positions shown; findings below may reference images not displayed]

EXAM:
IR ENDOLUMINAL BIOPSY OF BILIARY TREE; IR INT-EXT BILIARY DRAIN W/
CHOLANGIOGRAM

MEDICATIONS:
In patient currently on antibiotics.

ANESTHESIA/SEDATION:
Moderate (conscious) sedation was employed during this procedure. A
total of Versed 2 mg and Fentanyl 150 mcg was administered
intravenously.

Moderate Sedation Time: 33 minutes. The patient's level of
consciousness and vital signs were monitored continuously by
radiology nursing throughout the procedure under my direct
supervision.

FLUOROSCOPY TIME:  Fluoroscopy Time: 8 minutes 6 seconds (68 mGy).

COMPLICATIONS:
None immediate.

PROCEDURE:
Informed written consent was obtained from the patient. After a
thorough discussion of the procedural risks, benefits and
alternatives. All questions were addressed. Maximal Sterile Barrier
Technique was utilized including caps, mask, sterile gowns, sterile
gloves, sterile drape, hand hygiene and skin antiseptic. A timeout
was performed prior to the initiation of the procedure.

the liver was interrogated with ultrasound. Intrahepatic biliary
ductal dilatation is visualized. A suitable peripheral radicle in
the right hemi liver was identified. Local anesthesia was attained
by infiltration with 1% lidocaine. A small dermatotomy was made.
Under real-time ultrasound guidance, a 21 gauge trocar needle was
used to puncture the peripheral biliary radicle. A transhepatic
percutaneous cholangiogram was then performed confirming good access
of the biliary tree. There is marked intra and extrahepatic biliary
ductal dilatation. The cystic duct is patent. Contrast opacifies the
gallbladder. Both the right and left ducts are opacified. There is
complete occlusion of the common bile duct distally.

A 0.018 wire was advanced into the common bile duct. The soft tissue
tract was dilated utilizing the transitional sheath. An angled 4
French glide catheter and roadrunner wire were then advanced
coaxially and used to successfully navigate through the obstructed
distal common bile duct and into the duodenum. The road runner wire
was exchanged for an Amplatz wire. The transitional sheath was
exchanged for an 8 French Brite tip sheath which was advanced into
the distal common bile duct just proximal to the stenosis. Brush
biopsy was then performed 3 times. Specimens were placed in both
formalin and saline and delivered to pathology for further analysis.

The 8 French sheath was removed. A 10 French internal/external
biliary drainage catheter was then advanced over the wire and formed
with the locking loop in the duodenum. Contrast injection confirms
excellent placement and adequate drainage. The catheter was
connected to gravity bag drainage and secured to the skin with 0
Prolene suture. The patient tolerated the procedure well.
IMPRESSION: 1. Percutaneous transhepatic cholangiogram demonstrates complete
occlusion of the distal common bile duct.
2. Successful brush biopsy of obstructing lesion x3.
3. Successful placement of a 10 French internal/external biliary
drainage catheter.

PLAN:
1. Follow bilirubin. When significant downward trend is clear, the
bag should be capped. Recommend capping bag before discharge home if
possible.
2. Follow-up in IR in 4-6 weeks for initial biliary tube check and
exchange. If initial brush biopsies are negative, repeat biopsy
could be considered at that time.

## 2023-02-01 NOTE — Telephone Encounter (Signed)
Transition Care Management Unsuccessful Follow-up Telephone Call  Date of discharge and from where:  01/25/2023 [facility]  Attempts:  2nd Attempt  Reason for unsuccessful TCM follow-up call:  No answer/busy  Quintus Premo [facility]  [facility] Population Health Community Resource Care Guide   ??[email or web address]  ?? [phone]   Website: [email or web address]  [email or web address]

## 2023-02-02 ENCOUNTER — Other Ambulatory Visit: Payer: Self-pay | Admitting: Nurse Practitioner

## 2023-02-13 ENCOUNTER — Other Ambulatory Visit: Payer: Self-pay | Admitting: Hematology

## 2023-02-13 ENCOUNTER — Ambulatory Visit (HOSPITAL_COMMUNITY)
Admission: RE | Admit: 2023-02-13 | Discharge: 2023-02-13 | Disposition: A | Discharge status: Home / Self Care | Payer: Medicare HMO | Source: Ambulatory Visit | Attending: Hematology | Admitting: Hematology

## 2023-02-13 ENCOUNTER — Inpatient Hospital Stay: Payer: Medicare HMO | Attending: Physician Assistant

## 2023-02-13 DIAGNOSIS — D469 Myelodysplastic syndrome, unspecified: Secondary | ICD-10-CM | POA: Insufficient documentation

## 2023-02-13 DIAGNOSIS — C251 Malignant neoplasm of body of pancreas: Secondary | ICD-10-CM | POA: Insufficient documentation

## 2023-02-13 DIAGNOSIS — C259 Malignant neoplasm of pancreas, unspecified: Secondary | ICD-10-CM | POA: Diagnosis not present

## 2023-02-13 DIAGNOSIS — Z9049 Acquired absence of other specified parts of digestive tract: Secondary | ICD-10-CM | POA: Diagnosis not present

## 2023-02-13 DIAGNOSIS — C250 Malignant neoplasm of head of pancreas: Secondary | ICD-10-CM

## 2023-02-13 DIAGNOSIS — K8689 Other specified diseases of pancreas: Secondary | ICD-10-CM | POA: Diagnosis not present

## 2023-02-13 LAB — CMP (CANCER CENTER ONLY)
ALT: 19 U/L (ref 0–44)
AST: 29 U/L (ref 15–41)
Albumin: 3.3 g/dL — ABNORMAL LOW (ref 3.5–5.0)
Alkaline Phosphatase: 81 U/L (ref 38–126)
Anion gap: 8 (ref 5–15)
BUN: 23 mg/dL (ref 8–23)
CO2: 22 mmol/L (ref 22–32)
Calcium: 8.4 mg/dL — ABNORMAL LOW (ref 8.9–10.3)
Chloride: 102 mmol/L (ref 98–111)
Creatinine: 1.14 mg/dL (ref 0.61–1.24)
GFR, Estimated: >60 mL/min (ref >60)
Glucose, Bld: 158 mg/dL — ABNORMAL HIGH (ref 70–99)
Potassium: 4 mmol/L (ref 3.5–5.1)
Sodium: 132 mmol/L — ABNORMAL LOW (ref 135–145)
Total Bilirubin: 0.7 mg/dL (ref 0.3–1.2)
Total Protein: 7.3 g/dL (ref 6.5–8.1)

## 2023-02-13 LAB — CBC WITH DIFFERENTIAL (CANCER CENTER ONLY)
Abs Immature Granulocytes: 0.04 10*3/uL (ref 0.00–0.07)
Basophils Absolute: 0 10*3/uL (ref 0.0–0.1)
Basophils Relative: 0 %
Eosinophils Absolute: 0 10*3/uL (ref 0.0–0.5)
Eosinophils Relative: 0 %
HCT: 26 % — ABNORMAL LOW (ref 39.0–52.0)
Hemoglobin: 8.5 g/dL — ABNORMAL LOW (ref 13.0–17.0)
Immature Granulocytes: 1 %
Lymphocytes Relative: 15 %
Lymphs Abs: 1.2 10*3/uL (ref 0.7–4.0)
MCH: 26.6 pg (ref 26.0–34.0)
MCHC: 32.7 g/dL (ref 30.0–36.0)
MCV: 81.3 fL (ref 80.0–100.0)
Monocytes Absolute: 0.7 10*3/uL (ref 0.1–1.0)
Monocytes Relative: 8 %
Neutro Abs: 6.5 10*3/uL (ref 1.7–7.7)
Neutrophils Relative %: 76 %
Platelet Count: 214 10*3/uL (ref 150–400)
RBC: 3.2 MIL/uL — ABNORMAL LOW (ref 4.22–5.81)
RDW: 15.6 % — ABNORMAL HIGH (ref 11.5–15.5)
WBC Count: 8.5 10*3/uL (ref 4.0–10.5)
nRBC: 0 % (ref 0.0–0.2)

## 2023-02-13 MED ORDER — GADOBUTROL 1 MMOL/ML IV SOLN
5.0000 mL | Freq: Once | INTRAVENOUS | Status: AC | PRN
  Administered 2023-02-13: 5 mL via INTRAVENOUS

## 2023-02-16 ENCOUNTER — Encounter (HOSPITAL_BASED_OUTPATIENT_CLINIC_OR_DEPARTMENT_OTHER): Payer: Self-pay | Admitting: Cardiology

## 2023-02-16 NOTE — Assessment & Plan Note (Deleted)
cT2N0M0, stage IB, ypT2N0  -diagnosed in 07/2021, s/p neoadjuvant chemo gemcitabine alone on 08/20/21. Abraxane was added with cycle 2 (09/17/21), but he tolerated poorly and discontinued after first dose.  -s/p whipple 11/15/21 with Dr. Freida Busman. 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 Margins and lymph nodes negative. -s/p adjuvant gemcitabine 01/20/22 - 06/18/2022 -restaging CT from 08/17/22 showed small soft tissue nodule or lymph node interposed between the anterior aorta, portal vein, left renal vein, and superior mesenteric artery measuring 1.3 x 0.8 cm. This is new or at least significantly increased compared to prior CT 05/03/2022 and suspicious for local recurrence or nodal metastasis -unfortunately biopsy is not feasible due to location  -S/p SBRT, completed 09/30/2022 -Surveillance CT scan from Dec 15, 2022 showed no definitive evidence of recurrence, however the scan quality is limited due to lack of IV contrast, especially in the surgical site.   -he had abdominal MRI on 02/13/2023, unfortunately the examination is significantly limited by breath motion artifact, no definitive evidence of recurrence on MRI

## 2023-02-16 NOTE — Assessment & Plan Note (Deleted)
-  pt developed worsening thrombocytopenia in December 2023, s/p platelet transfusion.  -Bone marrow biopsy 07/28/22 showed hypercellular marrow and megakaryocytes dyspoiesis, which supports MDS.  Biopsy was negative for metastatic adenocarcinoma.  -cytogenetics and MDS FISH were normal  -he has responded well to Promacta -Due to his recent stroke, will keep Promacta at low-dose, and keep platelet count in 100-200 range.  Promacta was held when he had a ED visit for diarrhea on 11/28/2021. Due to normal plt, I have not restarted promacta yet.

## 2023-02-17 ENCOUNTER — Emergency Department (HOSPITAL_COMMUNITY): Payer: Medicare HMO

## 2023-02-17 ENCOUNTER — Other Ambulatory Visit: Payer: Medicare HMO

## 2023-02-17 ENCOUNTER — Observation Stay (HOSPITAL_BASED_OUTPATIENT_CLINIC_OR_DEPARTMENT_OTHER)
Admission: EM | Admit: 2023-02-17 | Discharge: 2023-02-19 | Disposition: A | Discharge status: Home / Self Care | Payer: Medicare HMO | Source: Home / Self Care | Attending: Emergency Medicine | Admitting: Emergency Medicine

## 2023-02-17 ENCOUNTER — Other Ambulatory Visit: Payer: Self-pay

## 2023-02-17 ENCOUNTER — Inpatient Hospital Stay: Payer: Medicare HMO | Admitting: Hematology

## 2023-02-17 ENCOUNTER — Telehealth: Payer: Self-pay

## 2023-02-17 ENCOUNTER — Inpatient Hospital Stay: Payer: Medicare HMO | Admitting: Dietician

## 2023-02-17 ENCOUNTER — Other Ambulatory Visit (HOSPITAL_COMMUNITY): Payer: Self-pay

## 2023-02-17 ENCOUNTER — Encounter (HOSPITAL_COMMUNITY): Payer: Self-pay

## 2023-02-17 DIAGNOSIS — M79601 Pain in right arm: Secondary | ICD-10-CM | POA: Diagnosis not present

## 2023-02-17 DIAGNOSIS — W19XXXA Unspecified fall, initial encounter: Secondary | ICD-10-CM

## 2023-02-17 DIAGNOSIS — I10 Essential (primary) hypertension: Secondary | ICD-10-CM | POA: Insufficient documentation

## 2023-02-17 DIAGNOSIS — G4489 Other headache syndrome: Secondary | ICD-10-CM | POA: Diagnosis not present

## 2023-02-17 DIAGNOSIS — E785 Hyperlipidemia, unspecified: Secondary | ICD-10-CM | POA: Diagnosis present

## 2023-02-17 DIAGNOSIS — I6521 Occlusion and stenosis of right carotid artery: Secondary | ICD-10-CM | POA: Diagnosis not present

## 2023-02-17 DIAGNOSIS — A419 Sepsis, unspecified organism: Secondary | ICD-10-CM | POA: Diagnosis not present

## 2023-02-17 DIAGNOSIS — M19011 Primary osteoarthritis, right shoulder: Secondary | ICD-10-CM | POA: Diagnosis not present

## 2023-02-17 DIAGNOSIS — R5381 Other malaise: Secondary | ICD-10-CM | POA: Diagnosis not present

## 2023-02-17 DIAGNOSIS — I6503 Occlusion and stenosis of bilateral vertebral arteries: Secondary | ICD-10-CM | POA: Diagnosis not present

## 2023-02-17 DIAGNOSIS — Z79890 Hormone replacement therapy (postmenopausal): Secondary | ICD-10-CM | POA: Diagnosis not present

## 2023-02-17 DIAGNOSIS — R9431 Abnormal electrocardiogram [ECG] [EKG]: Secondary | ICD-10-CM | POA: Diagnosis present

## 2023-02-17 DIAGNOSIS — Z8507 Personal history of malignant neoplasm of pancreas: Secondary | ICD-10-CM | POA: Insufficient documentation

## 2023-02-17 DIAGNOSIS — Z87891 Personal history of nicotine dependence: Secondary | ICD-10-CM | POA: Insufficient documentation

## 2023-02-17 DIAGNOSIS — R64 Cachexia: Secondary | ICD-10-CM | POA: Diagnosis present

## 2023-02-17 DIAGNOSIS — I6509 Occlusion and stenosis of unspecified vertebral artery: Secondary | ICD-10-CM | POA: Insufficient documentation

## 2023-02-17 DIAGNOSIS — D469 Myelodysplastic syndrome, unspecified: Secondary | ICD-10-CM

## 2023-02-17 DIAGNOSIS — Z7401 Bed confinement status: Secondary | ICD-10-CM | POA: Diagnosis not present

## 2023-02-17 DIAGNOSIS — G459 Transient cerebral ischemic attack, unspecified: Secondary | ICD-10-CM | POA: Diagnosis not present

## 2023-02-17 DIAGNOSIS — I7121 Aneurysm of the ascending aorta, without rupture: Secondary | ICD-10-CM | POA: Diagnosis present

## 2023-02-17 DIAGNOSIS — M75101 Unspecified rotator cuff tear or rupture of right shoulder, not specified as traumatic: Secondary | ICD-10-CM | POA: Diagnosis not present

## 2023-02-17 DIAGNOSIS — E039 Hypothyroidism, unspecified: Secondary | ICD-10-CM | POA: Diagnosis present

## 2023-02-17 DIAGNOSIS — E876 Hypokalemia: Secondary | ICD-10-CM | POA: Diagnosis present

## 2023-02-17 DIAGNOSIS — Z681 Body mass index (BMI) 19 or less, adult: Secondary | ICD-10-CM | POA: Diagnosis not present

## 2023-02-17 DIAGNOSIS — R42 Dizziness and giddiness: Secondary | ICD-10-CM | POA: Insufficient documentation

## 2023-02-17 DIAGNOSIS — S51011A Laceration without foreign body of right elbow, initial encounter: Secondary | ICD-10-CM | POA: Diagnosis present

## 2023-02-17 DIAGNOSIS — W010XXA Fall on same level from slipping, tripping and stumbling without subsequent striking against object, initial encounter: Secondary | ICD-10-CM | POA: Insufficient documentation

## 2023-02-17 DIAGNOSIS — R531 Weakness: Secondary | ICD-10-CM | POA: Diagnosis not present

## 2023-02-17 DIAGNOSIS — I700 Atherosclerosis of aorta: Secondary | ICD-10-CM | POA: Diagnosis not present

## 2023-02-17 DIAGNOSIS — I6502 Occlusion and stenosis of left vertebral artery: Secondary | ICD-10-CM | POA: Diagnosis present

## 2023-02-17 DIAGNOSIS — E860 Dehydration: Secondary | ICD-10-CM | POA: Diagnosis present

## 2023-02-17 DIAGNOSIS — S0181XA Laceration without foreign body of other part of head, initial encounter: Secondary | ICD-10-CM

## 2023-02-17 DIAGNOSIS — Y92009 Unspecified place in unspecified non-institutional (private) residence as the place of occurrence of the external cause: Secondary | ICD-10-CM | POA: Diagnosis not present

## 2023-02-17 DIAGNOSIS — K219 Gastro-esophageal reflux disease without esophagitis: Secondary | ICD-10-CM | POA: Diagnosis present

## 2023-02-17 DIAGNOSIS — E43 Unspecified severe protein-calorie malnutrition: Secondary | ICD-10-CM | POA: Diagnosis present

## 2023-02-17 DIAGNOSIS — R55 Syncope and collapse: Secondary | ICD-10-CM | POA: Diagnosis present

## 2023-02-17 DIAGNOSIS — F32A Depression, unspecified: Secondary | ICD-10-CM | POA: Diagnosis present

## 2023-02-17 DIAGNOSIS — C259 Malignant neoplasm of pancreas, unspecified: Secondary | ICD-10-CM | POA: Diagnosis present

## 2023-02-17 DIAGNOSIS — C251 Malignant neoplasm of body of pancreas: Secondary | ICD-10-CM

## 2023-02-17 DIAGNOSIS — W1830XA Fall on same level, unspecified, initial encounter: Secondary | ICD-10-CM | POA: Diagnosis present

## 2023-02-17 DIAGNOSIS — S4981XA Other specified injuries of right shoulder and upper arm, initial encounter: Secondary | ICD-10-CM | POA: Diagnosis not present

## 2023-02-17 DIAGNOSIS — S40211A Abrasion of right shoulder, initial encounter: Secondary | ICD-10-CM | POA: Diagnosis present

## 2023-02-17 DIAGNOSIS — M25521 Pain in right elbow: Secondary | ICD-10-CM | POA: Diagnosis not present

## 2023-02-17 DIAGNOSIS — R29898 Other symptoms and signs involving the musculoskeletal system: Secondary | ICD-10-CM | POA: Diagnosis not present

## 2023-02-17 DIAGNOSIS — L03113 Cellulitis of right upper limb: Secondary | ICD-10-CM | POA: Diagnosis present

## 2023-02-17 DIAGNOSIS — Z1152 Encounter for screening for COVID-19: Secondary | ICD-10-CM | POA: Diagnosis not present

## 2023-02-17 DIAGNOSIS — E7849 Other hyperlipidemia: Secondary | ICD-10-CM | POA: Diagnosis not present

## 2023-02-17 DIAGNOSIS — R296 Repeated falls: Secondary | ICD-10-CM | POA: Diagnosis present

## 2023-02-17 DIAGNOSIS — U071 COVID-19: Secondary | ICD-10-CM | POA: Diagnosis not present

## 2023-02-17 DIAGNOSIS — I959 Hypotension, unspecified: Secondary | ICD-10-CM | POA: Diagnosis not present

## 2023-02-17 DIAGNOSIS — M6281 Muscle weakness (generalized): Secondary | ICD-10-CM | POA: Diagnosis not present

## 2023-02-17 DIAGNOSIS — R627 Adult failure to thrive: Secondary | ICD-10-CM | POA: Diagnosis present

## 2023-02-17 DIAGNOSIS — K8689 Other specified diseases of pancreas: Secondary | ICD-10-CM | POA: Insufficient documentation

## 2023-02-17 DIAGNOSIS — M19021 Primary osteoarthritis, right elbow: Secondary | ICD-10-CM | POA: Diagnosis not present

## 2023-02-17 DIAGNOSIS — F419 Anxiety disorder, unspecified: Secondary | ICD-10-CM | POA: Diagnosis present

## 2023-02-17 LAB — CBC
HCT: 23.8 % — ABNORMAL LOW (ref 39.0–52.0)
Hemoglobin: 7.7 g/dL — ABNORMAL LOW (ref 13.0–17.0)
MCH: 26.1 pg (ref 26.0–34.0)
MCHC: 32.4 g/dL (ref 30.0–36.0)
MCV: 80.7 fL (ref 80.0–100.0)
Platelets: 209 10*3/uL (ref 150–400)
RBC: 2.95 MIL/uL — ABNORMAL LOW (ref 4.22–5.81)
RDW: 15.5 % (ref 11.5–15.5)
WBC: 7.7 10*3/uL (ref 4.0–10.5)
nRBC: 0 % (ref 0.0–0.2)

## 2023-02-17 LAB — COMPREHENSIVE METABOLIC PANEL
ALT: 53 U/L — ABNORMAL HIGH (ref 0–44)
AST: 73 U/L — ABNORMAL HIGH (ref 15–41)
Albumin: 2.9 g/dL — ABNORMAL LOW (ref 3.5–5.0)
Alkaline Phosphatase: 119 U/L (ref 38–126)
Anion gap: 8 (ref 5–15)
BUN: 30 mg/dL — ABNORMAL HIGH (ref 8–23)
CO2: 21 mmol/L — ABNORMAL LOW (ref 22–32)
Calcium: 8.2 mg/dL — ABNORMAL LOW (ref 8.9–10.3)
Chloride: 101 mmol/L (ref 98–111)
Creatinine, Ser: 1.16 mg/dL (ref 0.61–1.24)
GFR, Estimated: >60 mL/min (ref >60)
Glucose, Bld: 102 mg/dL — ABNORMAL HIGH (ref 70–99)
Potassium: 3.4 mmol/L — ABNORMAL LOW (ref 3.5–5.1)
Sodium: 130 mmol/L — ABNORMAL LOW (ref 135–145)
Total Bilirubin: 0.3 mg/dL (ref 0.3–1.2)
Total Protein: 7.2 g/dL (ref 6.5–8.1)

## 2023-02-17 MED ORDER — LEVOTHYROXINE SODIUM 112 MCG PO TABS
112.0000 ug | ORAL_TABLET | Freq: Every day | ORAL | Status: DC
  Administered 2023-02-18 – 2023-02-19 (×2): 112 ug via ORAL
  Filled 2023-02-17 (×2): qty 1

## 2023-02-17 MED ORDER — ATORVASTATIN CALCIUM 40 MG PO TABS
40.0000 mg | ORAL_TABLET | Freq: Every day | ORAL | Status: DC
  Administered 2023-02-18: 40 mg via ORAL
  Filled 2023-02-17: qty 1

## 2023-02-17 MED ORDER — POTASSIUM CHLORIDE 20 MEQ PO PACK
40.0000 meq | PACK | ORAL | Status: AC
  Administered 2023-02-18: 40 meq via ORAL
  Filled 2023-02-17 (×2): qty 2

## 2023-02-17 MED ORDER — ACETAMINOPHEN 650 MG RE SUPP: 650.0000 mg | Freq: Four times a day (QID) | RECTAL | Status: DC | PRN

## 2023-02-17 MED ORDER — ONDANSETRON HCL 4 MG PO TABS: 4.0000 mg | ORAL_TABLET | Freq: Four times a day (QID) | ORAL | Status: DC | PRN

## 2023-02-17 MED ORDER — IOHEXOL 350 MG/ML SOLN
75.0000 mL | Freq: Once | INTRAVENOUS | Status: AC | PRN
  Administered 2023-02-17: 75 mL via INTRAVENOUS

## 2023-02-17 MED ORDER — PANCRELIPASE (LIP-PROT-AMYL) 36000-114000 UNITS PO CPEP
72000.0000 [IU] | ORAL_CAPSULE | Freq: Three times a day (TID) | ORAL | Status: DC
  Administered 2023-02-18 – 2023-02-19 (×4): 72000 [IU] via ORAL
  Filled 2023-02-17 (×6): qty 2

## 2023-02-17 MED ORDER — ESCITALOPRAM OXALATE 10 MG PO TABS
20.0000 mg | ORAL_TABLET | Freq: Every day | ORAL | Status: DC
  Administered 2023-02-18 – 2023-02-19 (×2): 20 mg via ORAL
  Filled 2023-02-17 (×2): qty 2

## 2023-02-17 MED ORDER — SODIUM CHLORIDE (PF) 0.9 % IJ SOLN
INTRAMUSCULAR | Status: AC
  Filled 2023-02-17: qty 50

## 2023-02-17 MED ORDER — AMLODIPINE BESYLATE 5 MG PO TABS
5.0000 mg | ORAL_TABLET | Freq: Every day | ORAL | Status: DC
  Administered 2023-02-18 – 2023-02-19 (×2): 5 mg via ORAL
  Filled 2023-02-17 (×2): qty 1

## 2023-02-17 MED ORDER — PANTOPRAZOLE SODIUM 40 MG PO TBEC
40.0000 mg | DELAYED_RELEASE_TABLET | Freq: Two times a day (BID) | ORAL | Status: DC
  Administered 2023-02-17 – 2023-02-19 (×4): 40 mg via ORAL
  Filled 2023-02-17 (×4): qty 1

## 2023-02-17 MED ORDER — ONDANSETRON HCL 4 MG/2ML IJ SOLN: 4.0000 mg | Freq: Four times a day (QID) | INTRAMUSCULAR | Status: DC | PRN

## 2023-02-17 MED ORDER — ENOXAPARIN SODIUM 40 MG/0.4ML IJ SOSY
40.0000 mg | PREFILLED_SYRINGE | INTRAMUSCULAR | Status: DC
  Administered 2023-02-17 – 2023-02-18 (×2): 40 mg via SUBCUTANEOUS
  Filled 2023-02-17 (×2): qty 0.4

## 2023-02-17 MED ORDER — MAGNESIUM SULFATE 2 GM/50ML IV SOLN
2.0000 g | Freq: Once | INTRAVENOUS | Status: AC
  Administered 2023-02-18: 2 g via INTRAVENOUS
  Filled 2023-02-17: qty 50

## 2023-02-17 MED ORDER — LACTATED RINGERS IV SOLN: INTRAVENOUS | Status: DC

## 2023-02-17 MED ORDER — ACETAMINOPHEN 325 MG PO TABS
650.0000 mg | ORAL_TABLET | Freq: Four times a day (QID) | ORAL | Status: DC | PRN
  Administered 2023-02-19: 650 mg via ORAL
  Filled 2023-02-17: qty 2

## 2023-02-17 MED ORDER — SODIUM CHLORIDE 0.9 % IV BOLUS
1000.0000 mL | Freq: Once | INTRAVENOUS | Status: AC
  Administered 2023-02-17: 1000 mL via INTRAVENOUS

## 2023-02-17 MED ORDER — OXYCODONE HCL 5 MG PO TABS
5.0000 mg | ORAL_TABLET | ORAL | Status: DC | PRN
  Administered 2023-02-17 – 2023-02-18 (×3): 5 mg via ORAL
  Filled 2023-02-17 (×3): qty 1

## 2023-02-17 MED ORDER — LIDOCAINE-EPINEPHRINE (PF) 2 %-1:200000 IJ SOLN
10.0000 mL | Freq: Once | INTRAMUSCULAR | Status: AC
  Administered 2023-02-17: 10 mL
  Filled 2023-02-17: qty 20

## 2023-02-17 MED ORDER — MIRTAZAPINE 7.5 MG PO TABS: 7.5000 mg | ORAL_TABLET | Freq: Every day | ORAL | Status: DC

## 2023-02-17 MED ORDER — MEGESTROL ACETATE 625 MG/5ML PO SUSP: 625.0000 mg | Freq: Every day | ORAL | Status: DC

## 2023-02-17 NOTE — ED Provider Notes (Signed)
Care of patient received from prior provider at 5:04 PM, please see their note for complete H/P and care plan.  Received handoff per ED course.  Clinical Course as of 02/17/23 1704  Fri Feb 17, 2023  1703 Stable  82 YOM with GLF, poor PO intake. Weakness and dehydration, AMS and Panc cancer not tolerating PO at home.  CTA pending. [CC]    Clinical Course User Index [CC] Glyn Ade, MD    Reassessment: Reassessed at bedside.  Patient too lightheaded to ambulate.  Will admit to hospitalist for ongoing care and management.     Glyn Ade, MD 02/17/23 [phone]

## 2023-02-17 NOTE — H&P (Signed)
History and Physical    Angel Keller UJW:[id number] DOB: 1941-07-03 DOA: 02/17/2023  PCP: Carlean Jews, NP   Chief Complaint: fall  HPI: Angel Keller is a 82 y.o. male with medical history significant of thoracic aortic aneurysm, pancreatic cancer who presented to the emergency department after a fall.  Patient was planning to go to a cancer center doctor's appointment when he felt lightheaded and fell to the ground hitting his right elbow shoulder.  He is feeling lightheaded and dizzy for the last several days due to decreased oral intake.  He is planning on seeing nutritionist.  He states that since his diagnosis he has become more weak and had some lightheadedness.  He presented to the ER where he was found be afebrile he was in stable.  Labs were obtained which showed sodium 130, potassium 3.4, bicarb 21 creatinine 1.1 at baseline, AST 73, ALT 53, WBC 7.7, hemoglobin 7.7.  Patient underwent shoulder x-ray which was negative for fracture, elbow x-ray which was negative for fracture.  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  Patient was admitted for IV hydration and further management.    Review of Systems: Review of Systems  All other systems reviewed and are negative.    As per HPI otherwise 10 point review of systems negative.   No Known Allergies  Past Medical History:  Diagnosis Date   Anxiety    Arthritis    Depression    Family history of breast cancer    Family history of lung cancer    Headache    HLD (hyperlipidemia)    Hx of inguinal hernia repair    Hypertension    Hypothyroidism    Nausea with vomiting 09/21/2021   Overactive bladder    Pancreatic cancer (HCC)    Pneumonia    Shingles    Sleep apnea    Patient denies   Thoracic ascending aortic aneurysm (HCC)    mildly dilated 4.0cm per 08/12/21 CT   Thyroid disease    Varicose veins of both  lower extremities     Past Surgical History:  Procedure Laterality Date   BILATERAL CARPAL TUNNEL RELEASE     CATARACT EXTRACTION, BILATERAL Bilateral    ELBOW SURGERY Bilateral    ENDOSCOPIC RETROGRADE CHOLANGIOPANCREATOGRAPHY (ERCP) WITH PROPOFOL N/A 07/20/2021   Procedure: ENDOSCOPIC RETROGRADE CHOLANGIOPANCREATOGRAPHY (ERCP) WITH PROPOFOL;  Surgeon: Iva Boop, MD;  Location: [facility] ENDOSCOPY;  Service: Endoscopy;  Laterality: N/A;   ESOPHAGOGASTRODUODENOSCOPY (EGD) WITH PROPOFOL N/A 08/05/2021   Procedure: ESOPHAGOGASTRODUODENOSCOPY (EGD) WITH PROPOFOL;  Surgeon: Meridee Score Netty Starring., MD;  Location: [facility] ENDOSCOPY;  Service: Gastroenterology;  Laterality: N/A;   EUS N/A 08/05/2021   Procedure: UPPER ENDOSCOPIC ULTRASOUND (EUS) RADIAL;  Surgeon: Lemar Lofty., MD;  Location: [facility] ENDOSCOPY;  Service: Gastroenterology;  Laterality: N/A;   EYE SURGERY     bilateral cataracts   FINE NEEDLE ASPIRATION  08/05/2021   Procedure: FINE NEEDLE ASPIRATION (FNA) LINEAR;  Surgeon: Lemar Lofty., MD;  Location: [facility] ENDOSCOPY;  Service: Gastroenterology;;   INGUINAL HERNIA REPAIR Right 1951   IR ANGIO INTRA EXTRACRAN SEL COM CAROTID INNOMINATE BILAT MOD SED  02/20/2019   IR BILIARY STENT(S) EXISTING ACCESS INC DILATION CATH EXCHANGE  09/20/2021   IR ENDOLUMINAL BX OF BILIARY TREE  07/22/2021   IR GENERIC HISTORICAL  10/28/2016   IR RADIOLOGIST EVAL & MGMT 10/28/2016 [facility]  RAD   IR INT EXT BILIARY DRAIN WITH CHOLANGIOGRAM  07/22/2021   JOINT REPLACEMENT     LAPAROSCOPY N/A 11/15/2021   Procedure: LAPAROSCOPIC DIAGNOSTIC STAGING;  Surgeon: Fritzi Mandes, MD;  Location: [facility] OR;  Service: General;  Laterality: N/A;   PORTACATH PLACEMENT N/A 08/19/2021   Procedure: INSERTION PORT-A-CATH;  Surgeon: Fritzi Mandes, MD;  Location: [facility] ORS;  Service: General;  Laterality: N/A;  LMA   RADIOLOGY WITH ANESTHESIA N/A 10/21/2015   Procedure: RADIOLOGY WITH ANESTHESIA;  Surgeon: Julieanne Cotton, MD;  Location: [facility] OR;  Service: Radiology;  Laterality: N/A;   RADIOLOGY WITH ANESTHESIA N/A 02/20/2019   Procedure: Sharman Crate;  Surgeon: Julieanne Cotton, MD;  Location: [facility] OR;  Service: Radiology;  Laterality: N/A;   ROTATOR CUFF REPAIR Bilateral    VARICOSE VEIN SURGERY     WHIPPLE PROCEDURE N/A 11/15/2021   Procedure: WHIPPLE PROCEDURE;  Surgeon: Fritzi Mandes, MD;  Location: [facility] OR;  Service: General;  Laterality: N/A;     reports that he quit smoking about 61 years ago. His smoking use included cigarettes. He has a 0.25 pack-year smoking history. He has never used smokeless tobacco. He reports that he does not drink alcohol and does not use drugs.  Family History  Problem Relation Age of Onset   Breast cancer Mother    Lung cancer Father    Alcoholism Sister    Cirrhosis Sister    Colon cancer Neg Hx    Esophageal cancer Neg Hx    Liver cancer Neg Hx    Pancreatic cancer Neg Hx    Prostate cancer Neg Hx     Prior to Admission medications   Medication Sig Start Date End Date Taking? Authorizing Provider  amLODipine (NORVASC) 5 MG tablet TAKE 1 TABLET (5 MG TOTAL) BY MOUTH DAILY. 11/17/22 05/16/23  Carlean Jews, NP  atorvastatin (LIPITOR) 40 MG tablet TAKE 1 TABLET (40 MG TOTAL) BY MOUTH AT BEDTIME. 02/02/23   Carlean Jews, NP  Cholecalciferol (VITAMIN D3) 50 MCG (2000 UT) TABS Take 2,000 Units by mouth daily.    [provider]  eltrombopag (PROMACTA) 50 MG tablet Take 1 tablet (50 mg total) by mouth daily. Take on an empty stomach 1 hour before a meal or 2 hours after 11/11/22   Malachy Mood, MD  escitalopram (LEXAPRO) 20 MG tablet Take 1 tablet (20 mg total) by mouth daily. 09/26/22   Carlean Jews, NP  levothyroxine (SYNTHROID) 112 MCG tablet Take 1 tablet (112 mcg total) by mouth daily before breakfast. 09/26/22   Carlean Jews, NP  lidocaine-prilocaine (EMLA) cream Apply topically to affected area(s) as needed. Patient taking differently:  Apply 1 Application topically as needed (port). 08/24/22   Ronny Bacon, PA-C  lipase/protease/amylase (CREON) 36000 UNITS CPEP capsule Take 1 capsule (36,000 Units total) by mouth 3 (three) times daily before meals. Patient taking differently: Take 72,000 Units by mouth 3 (three) times daily before meals. 08/24/22   Malachy Mood, MD  loperamide (IMODIUM) 2 MG capsule Take 1 capsule (2 mg total) by mouth 4 (four) times daily as needed for diarrhea or loose stools. 11/29/22   Rexford Maus, DO  megestrol (MEGACE ES) 625 MG/5ML suspension Take 5 mLs (625 mg total) by mouth daily. 01/02/23   Carlean Jews, NP  mirtazapine (REMERON) 7.5 MG tablet Take 1 tablet (7.5 mg total) by mouth at bedtime. 11/01/22   Carlean Jews, NP  pantoprazole (PROTONIX) 40 MG tablet Take  1 tablet (40 mg total) by mouth 2 (two) times daily. 01/02/23   Carlean Jews, NP  prochlorperazine (COMPAZINE) 10 MG tablet Take 1 tablet (10 mg total) by mouth every 6 (six) hours as needed (Nausea or vomiting). 06/09/22   Malachy Mood, MD  vitamin B-12 (CYANOCOBALAMIN) 1000 MCG tablet Take 1,000 mcg by mouth 2 (two) times daily.    [provider]    Physical Exam: Vitals:   02/17/23 1530 02/17/23 1659 02/17/23 1730 02/17/23 2059  BP: (!) 146/70 (!) 143/83 (!) 146/86 (!) 157/82  Pulse: 75 79 74 70  Resp:  16  14  Temp:  98.6 F (37 C)  98.9 F (37.2 C)  TempSrc:  Oral  Oral  SpO2: 99% 99% 98% 100%  Weight:    60.8 kg   Physical Exam Constitutional:      Appearance: He is normal weight.  HENT:     Head: Normocephalic.     Mouth/Throat:     Mouth: Mucous membranes are dry.  Eyes:     Extraocular Movements: Extraocular movements intact.     Conjunctiva/sclera: Conjunctivae normal.     Pupils: Pupils are equal, round, and reactive to light.  Cardiovascular:     Rate and Rhythm: Normal rate and regular rhythm.     Pulses: Normal pulses.     Heart sounds: Normal heart sounds.  Pulmonary:      Effort: Pulmonary effort is normal.     Breath sounds: Normal breath sounds.  Abdominal:     General: Abdomen is flat. Bowel sounds are normal.  Musculoskeletal:        General: Normal range of motion.     Cervical back: Normal range of motion.  Skin:    General: Skin is dry.     Capillary Refill: Capillary refill takes less than 2 seconds.  Neurological:     General: No focal deficit present.     Mental Status: He is alert. Mental status is at baseline.        Labs on Admission: I have personally reviewed the patients's labs and imaging studies.  Assessment/Plan Principal Problem:   Syncope   # Ground-level fall likely secondary to dehydration poor p.o. intake # Dehydration # Pancreatic cancer - Patient endorses poor p.o. intake in setting of cancer diagnosis - Patient was scheduled to see oncologist.  At last visit on 6/10 patient had ongoing Whipple in 2023 and neoadjuvant chemotherapy he had an MRI of his abdomen that time which showed no evidence of metastatic disease. 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  Will continue to monitor  # Hypokalemia-replete potassium and check magnesium levels  Elevated LFTs-patient is history of elevated LFTs likely due to history of malignancy, prior surgeries and chronic liver disease.  Will continue to monitor. Admission status: Observation Telemetry  Certification: The appropriate patient status for this patient is OBSERVATION. Observation status is judged to be reasonable and necessary in order to provide the required intensity of service to ensure the patient's safety. The patient's presenting symptoms, physical exam findings, and initial radiographic and laboratory data in the context of their medical condition is felt to place them at decreased risk for  further clinical deterioration. Furthermore, it is anticipated that the patient will be medically stable for discharge from the hospital within 2 midnights of admission.  Alan Mulder MD [facility] If 7PM-7AM, please contact night-coverage [email or web address]  02/17/2023, 11:15 PM

## 2023-02-17 NOTE — ED Provider Notes (Signed)
[place] EMERGENCY DEPARTMENT AT [facility] Provider Note   CSN: [id number] Arrival date & time: 02/17/23  1312     History  Chief Complaint  Patient presents with   Dizziness   Fall    Angel Weisel. is a 82 y.o. male.   Dizziness Fall  Patient presents with dizziness and fall.  Reported some feeling bad for the last few days.  Recurrent falls.  Abrasion to right shoulder and right elbow.  Did have recent falls with evaluation of the right elbow.  Has more lightheaded.  Has had decreased oral intake.  States he feels dizzy but somewhat difficult to chronic find that.  Does have a history of pancreatic cancer.    Past Medical History:  Diagnosis Date   Anxiety    Arthritis    Depression    Family history of breast cancer    Family history of lung cancer    Headache    HLD (hyperlipidemia)    Hx of inguinal hernia repair    Hypertension    Hypothyroidism    Nausea with vomiting 09/21/2021   Overactive bladder    Pancreatic cancer (HCC)    Pneumonia    Shingles    Sleep apnea    Patient denies   Thoracic ascending aortic aneurysm (HCC)    mildly dilated 4.0cm per 08/12/21 CT   Thyroid disease    Varicose veins of both lower extremities     Home Medications Prior to Admission medications   Medication Sig Start Date End Date Taking? Authorizing Provider  amLODipine (NORVASC) 5 MG tablet TAKE 1 TABLET (5 MG TOTAL) BY MOUTH DAILY. 11/17/22 05/16/23  Carlean Jews, NP  atorvastatin (LIPITOR) 40 MG tablet TAKE 1 TABLET (40 MG TOTAL) BY MOUTH AT BEDTIME. 02/02/23   Carlean Jews, NP  Cholecalciferol (VITAMIN D3) 50 MCG (2000 UT) TABS Take 2,000 Units by mouth daily.    [provider]  eltrombopag (PROMACTA) 50 MG tablet Take 1 tablet (50 mg total) by mouth daily. Take on an empty stomach 1 hour before a meal or 2 hours after 11/11/22   Malachy Mood, MD  escitalopram (LEXAPRO) 20 MG tablet Take 1 tablet (20 mg total) by mouth daily. 09/26/22    Carlean Jews, NP  levothyroxine (SYNTHROID) 112 MCG tablet Take 1 tablet (112 mcg total) by mouth daily before breakfast. 09/26/22   Carlean Jews, NP  lidocaine-prilocaine (EMLA) cream Apply topically to affected area(s) as needed. Patient taking differently: Apply 1 Application topically as needed (port). 08/24/22   Ronny Bacon, PA-C  lipase/protease/amylase (CREON) 36000 UNITS CPEP capsule Take 1 capsule (36,000 Units total) by mouth 3 (three) times daily before meals. Patient taking differently: Take 72,000 Units by mouth 3 (three) times daily before meals. 08/24/22   Malachy Mood, MD  loperamide (IMODIUM) 2 MG capsule Take 1 capsule (2 mg total) by mouth 4 (four) times daily as needed for diarrhea or loose stools. 11/29/22   Rexford Maus, DO  megestrol (MEGACE ES) 625 MG/5ML suspension Take 5 mLs (625 mg total) by mouth daily. 01/02/23   Carlean Jews, NP  mirtazapine (REMERON) 7.5 MG tablet Take 1 tablet (7.5 mg total) by mouth at bedtime. 11/01/22   Carlean Jews, NP  pantoprazole (PROTONIX) 40 MG tablet Take 1 tablet (40 mg total) by mouth 2 (two) times daily. 01/02/23   Carlean Jews, NP  prochlorperazine (COMPAZINE) 10 MG tablet Take 1 tablet (10  mg total) by mouth every 6 (six) hours as needed (Nausea or vomiting). 06/09/22   Malachy Mood, MD  vitamin B-12 (CYANOCOBALAMIN) 1000 MCG tablet Take 1,000 mcg by mouth 2 (two) times daily.    [provider]      Allergies    Patient has no known allergies.    Review of Systems   Review of Systems  Neurological:  Positive for dizziness.    Physical Exam Updated Vital Signs BP (!) 146/70   Pulse 75   Temp 98.6 F (37 C) (Oral)   Resp 16   SpO2 99%  Physical Exam Vitals reviewed.  HENT:     Head:     Comments: Vertical laceration over right forehead.    Mouth/Throat:     Mouth: Mucous membranes are moist.  Eyes:     Extraocular Movements: Extraocular movements intact.  Cardiovascular:      Rate and Rhythm: Regular rhythm.  Abdominal:     Tenderness: There is no abdominal tenderness.  Musculoskeletal:     Cervical back: Neck supple. No tenderness.     Comments: Some tenderness to right shoulder with abrasion.  Tenderness to right elbow laterally with previous abrasion at that site.  No chest or spine tenderness.  Neurological:     Mental Status: He is alert and oriented to person, place, and time.     ED Results / Procedures / Treatments   Labs (all labs ordered are listed, but only abnormal results are displayed) Labs Reviewed  COMPREHENSIVE METABOLIC PANEL - Abnormal; Notable for the following components:      Result Value   Sodium 130 (*)    Potassium 3.4 (*)    CO2 21 (*)    Glucose, Bld 102 (*)    BUN 30 (*)    Calcium 8.2 (*)    Albumin 2.9 (*)    AST 73 (*)    ALT 53 (*)    All other components within normal limits  CBC - Abnormal; Notable for the following components:   RBC 2.95 (*)    Hemoglobin 7.7 (*)    HCT 23.8 (*)    All other components within normal limits    EKG EKG Interpretation Date/Time:  Friday February 17 2023 13:23:54 EDT Ventricular Rate:  72 PR Interval:  161 QRS Duration:  78 QT Interval:  443 QTC Calculation: 485 R Axis:   25  Text Interpretation: Sinus rhythm Borderline prolonged QT interval Confirmed by Benjiman Core [id number]) on 02/17/2023 4:37:02 PM  Radiology DG Elbow Complete Right  Result Date: 02/17/2023 CLINICAL DATA:  Right arm pain after falling. Patient had a skin tear with bleeding on the posterior aspect of his right elbow. EXAM: RIGHT ELBOW - COMPLETE 3+ VIEW COMPARISON:  None Available. FINDINGS: There is diffuse decreased bone mineralization. Mild-to-moderate medial and lateral elbow joint space narrowing, greatest at the medial aspect of the trochlea-coronoid articulation. Mild radial head-neck junction degenerative spurring, greatest at the articulation with the radial notch of the ulna. Mild chronic  enthesopathic change at the common flexor tendon origin at the medial epicondyle greater than the common extensor tendon origin at the lateral epicondyle. There is soft tissue attenuation, irregularity, and mild subcutaneous air at the posterior and medial aspects of the elbow consistent with reported wound. No acute fracture or dislocation. IMPRESSION: Soft tissue attenuation, irregularity, and mild subcutaneous air at the posterior and medial aspects of the elbow consistent with the reported wound. 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  No acute fracture. Electronically Signed   By: Neita Garnet M.D.   On: 02/17/2023 15:05   DG Shoulder Right  Result Date: 02/17/2023 CLINICAL DATA:  Dizziness and fall. Recurrent falls recently. Head laceration, bleeding controlled. Skin tears to right arm - right anterior skin tears on right shoulder -Hx of right rotator cuff surgery EXAM: RIGHT SHOULDER - 2+ VIEW COMPARISON:  None available FINDINGS: No fracture or dislocation. Soft tissues are unremarkable. Moderate degenerative changes of the glenohumeral and acromioclavicular joints. Prominent subacromial spur is present. Irregularity of the greater tuberosity consistent with rotator cuff tendinopathy. Right chest port is seen terminating in the region of the upper SVC. IMPRESSION: No acute abnormality of the right shoulder. Degenerative changes as discussed above. Electronically Signed   By: Acquanetta Belling M.D.   On: 02/17/2023 14:18    Procedures .Marland KitchenLaceration Repair  Date/Time: 02/17/2023 4:51 PM  Performed by: Benjiman Core, MD Authorized by: Benjiman Core, MD   Consent:    Consent obtained:  Verbal   Consent given by:  Patient   Risks discussed:  Infection, pain, need for additional repair and nerve damage   Alternatives discussed:  No treatment Laceration details:    Length (cm):  2 Pre-procedure details:    Preparation:  Patient was prepped and draped in usual sterile  fashion and imaging obtained to evaluate for foreign bodies Exploration:    Limited defect created (wound extended): no     Hemostasis achieved with:  Epinephrine   Contaminated: no   Treatment:    Area cleansed with:  Saline   Amount of cleaning:  Standard   Irrigation method:  Syringe Skin repair:    Repair method:  Sutures   Suture size:  4-0   Wound skin closure material used: victyl rapide.   Number of sutures:  4 Approximation:    Approximation:  Close Repair type:    Repair type:  Simple Post-procedure details:    Dressing:  Sterile dressing   Procedure completion:  Tolerated well, no immediate complications     Medications Ordered in ED Medications  lidocaine-EPINEPHrine (XYLOCAINE W/EPI) 2 %-1:200000 (PF) injection 10 mL (has no administration in time range)  sodium chloride 0.9 % bolus 1,000 mL (1,000 mLs Intravenous New Bag/Given 02/17/23 1454)  iohexol (OMNIPAQUE) 350 MG/ML injection 75 mL (75 mLs Intravenous Contrast Given 02/17/23 1606)    ED Course/ Medical Decision Making/ A&P                             Medical Decision Making Amount and/or Complexity of Data Reviewed Labs: ordered. Radiology: ordered.  Risk Prescription drug management.   Patient with fall.  Weakness.  History of pancreatic cancer with decreased oral intake.  Potentially could be dehydrated but also history of strokes and this dizziness could be vertiginous.  Somewhat difficult to get history from the patient states he feels weak all over.  Fluid bolus will be given.  Shoulder x-ray and elbow x-ray overall reassuring.  I reviewed previous oncology note  Does have anemia.  Slightly worse than normal.  With decreased oral intake this is a potential cause.  Fluid boluses been given.  Does have laceration on forehead that is approximately a centimeter and a half long.  Will closed.  Pending CTA results at this time.    Care turned over to Dr Doran Durand  Wound closed.  Patient's son Derry Lory do  better with admission due to dehydration and not  eating and drinking.        Final Clinical Impression(s) / ED Diagnoses Final diagnoses:  Fall, initial encounter  Facial laceration, initial encounter  Dehydration    Rx / DC Orders ED Discharge Orders     None         Benjiman Core, MD 02/17/23 1652

## 2023-02-17 NOTE — ED Notes (Signed)
ED TO INPATIENT HANDOFF REPORT  ED Nurse Name and Phone #: Linus Orn Name/Age/Gender Angel Frederic Sr. 82 y.o. male Room/Bed: WA02/WA02  Code Status   Code Status: Full Code  Home/SNF/Other Home Patient oriented to: self, place, time, and situation Is this baseline? No   Triage Complete: Triage complete  Chief Complaint Syncope [R55]  Triage Note Pt arrived via EMS, from home. C/o dizziness and fall. Recurrent falls recently. Head laceration, bleeding controlled. Skin tears to right arm    Allergies No Known Allergies  Level of Care/Admitting Diagnosis ED Disposition     ED Disposition  Admit   Condition  --   Comment  Hospital Area: [facility]  HOSPITAL [[id number]]  Level of Care: Telemetry [5]  Admit to tele based on following criteria: Eval of Syncope  May place patient in observation at [facility] or [facility] if equivalent level of care is available:: Yes  Covid Evaluation: Asymptomatic - no recent exposure (last 10 days) testing not required  Diagnosis: Syncope [206001]  Admitting Physician: Alan Mulder [[id number]]  Attending Physician: Alan Mulder [[id number]]          B Medical/Surgery History Past Medical History:  Diagnosis Date   Anxiety    Arthritis    Depression    Family history of breast cancer    Family history of lung cancer    Headache    HLD (hyperlipidemia)    Hx of inguinal hernia repair    Hypertension    Hypothyroidism    Nausea with vomiting 09/21/2021   Overactive bladder    Pancreatic cancer (HCC)    Pneumonia    Shingles    Sleep apnea    Patient denies   Thoracic ascending aortic aneurysm (HCC)    mildly dilated 4.0cm per 08/12/21 CT   Thyroid disease    Varicose veins of both lower extremities    Past Surgical History:  Procedure Laterality Date   BILATERAL CARPAL TUNNEL RELEASE     CATARACT EXTRACTION, BILATERAL Bilateral    ELBOW SURGERY Bilateral    ENDOSCOPIC RETROGRADE  CHOLANGIOPANCREATOGRAPHY (ERCP) WITH PROPOFOL N/A 07/20/2021   Procedure: ENDOSCOPIC RETROGRADE CHOLANGIOPANCREATOGRAPHY (ERCP) WITH PROPOFOL;  Surgeon: Iva Boop, MD;  Location: [facility] ENDOSCOPY;  Service: Endoscopy;  Laterality: N/A;   ESOPHAGOGASTRODUODENOSCOPY (EGD) WITH PROPOFOL N/A 08/05/2021   Procedure: ESOPHAGOGASTRODUODENOSCOPY (EGD) WITH PROPOFOL;  Surgeon: Meridee Score Netty Starring., MD;  Location: [facility] ENDOSCOPY;  Service: Gastroenterology;  Laterality: N/A;   EUS N/A 08/05/2021   Procedure: UPPER ENDOSCOPIC ULTRASOUND (EUS) RADIAL;  Surgeon: Lemar Lofty., MD;  Location: [facility] ENDOSCOPY;  Service: Gastroenterology;  Laterality: N/A;   EYE SURGERY     bilateral cataracts   FINE NEEDLE ASPIRATION  08/05/2021   Procedure: FINE NEEDLE ASPIRATION (FNA) LINEAR;  Surgeon: Lemar Lofty., MD;  Location: [facility] ENDOSCOPY;  Service: Gastroenterology;;   INGUINAL HERNIA REPAIR Right 1952   IR ANGIO INTRA EXTRACRAN SEL COM CAROTID INNOMINATE BILAT MOD SED  02/20/2019   IR BILIARY STENT(S) EXISTING ACCESS INC DILATION CATH EXCHANGE  09/20/2021   IR ENDOLUMINAL BX OF BILIARY TREE  07/22/2021   IR GENERIC HISTORICAL  10/28/2016   IR RADIOLOGIST EVAL & MGMT 10/28/2016 [facility]-INTERV RAD   IR INT EXT BILIARY DRAIN WITH CHOLANGIOGRAM  07/22/2021   JOINT REPLACEMENT     LAPAROSCOPY N/A 11/15/2021   Procedure: LAPAROSCOPIC DIAGNOSTIC STAGING;  Surgeon: Fritzi Mandes, MD;  Location: [facility] OR;  Service: General;  Laterality: N/A;   PORTACATH PLACEMENT N/A  08/19/2021   Procedure: INSERTION PORT-A-CATH;  Surgeon: Fritzi Mandes, MD;  Location: [facility] ORS;  Service: General;  Laterality: N/A;  LMA   RADIOLOGY WITH ANESTHESIA N/A 10/21/2015   Procedure: RADIOLOGY WITH ANESTHESIA;  Surgeon: Julieanne Cotton, MD;  Location: [facility] OR;  Service: Radiology;  Laterality: N/A;   RADIOLOGY WITH ANESTHESIA N/A 02/20/2019   Procedure: Sharman Crate;  Surgeon: Julieanne Cotton, MD;  Location: [facility] OR;  Service:  Radiology;  Laterality: N/A;   ROTATOR CUFF REPAIR Bilateral    VARICOSE VEIN SURGERY     WHIPPLE PROCEDURE N/A 11/15/2021   Procedure: WHIPPLE PROCEDURE;  Surgeon: Fritzi Mandes, MD;  Location: [facility] OR;  Service: General;  Laterality: N/A;     A IV Location/Drains/Wounds Patient Lines/Drains/Airways Status     Active Line/Drains/Airways     Name Placement date Placement time Site Days   Implanted Port 10/06/22 Right Chest 10/06/22  1538  Chest  134            Intake/Output Last 24 hours  Intake/Output Summary (Last 24 hours) at 02/17/2023 2035 Last data filed at 02/17/2023 1710 Gross per 24 hour  Intake 1000 ml  Output --  Net 1000 ml    Labs/Imaging Results for orders placed or performed during the hospital encounter of 02/17/23 (from the past 48 hour(s))  Comprehensive metabolic panel     Status: Abnormal   Collection Time: 02/17/23  2:56 PM  Result Value Ref Range   Sodium 130 (L) 135 - 145 mmol/L   Potassium 3.4 (L) 3.5 - 5.1 mmol/L   Chloride 101 98 - 111 mmol/L   CO2 21 (L) 22 - 32 mmol/L   Glucose, Bld 102 (H) 70 - 99 mg/dL    Comment: Glucose reference range applies only to samples taken after fasting for at least 8 hours.   BUN 30 (H) 8 - 23 mg/dL   Creatinine, Ser 8.65 0.61 - 1.24 mg/dL   Calcium 8.2 (L) 8.9 - 10.3 mg/dL   Total Protein 7.2 6.5 - 8.1 g/dL   Albumin 2.9 (L) 3.5 - 5.0 g/dL   AST 73 (H) 15 - 41 U/L   ALT 53 (H) 0 - 44 U/L   Alkaline Phosphatase 119 38 - 126 U/L   Total Bilirubin 0.3 0.3 - 1.2 mg/dL   GFR, Estimated >78 >46 mL/min    Comment: (NOTE) Calculated using the CKD-EPI Creatinine Equation (2021)    Anion gap 8 5 - 15    Comment: Performed at [facility], [address] [place], [place] [address]  CBC     Status: Abnormal   Collection Time: 02/17/23  2:56 PM  Result Value Ref Range   WBC 7.7 4.0 - 10.5 K/uL   RBC 2.95 (L) 4.22 - 5.81 MIL/uL   Hemoglobin 7.7 (L) 13.0 - 17.0 g/dL   HCT 28.4 (L) 13.2 - 44.0 %    MCV 80.7 80.0 - 100.0 fL   MCH 26.1 26.0 - 34.0 pg   MCHC 32.4 30.0 - 36.0 g/dL   RDW 10.2 72.5 - 36.6 %   Platelets 209 150 - 400 K/uL   nRBC 0.0 0.0 - 0.2 %    Comment: Performed at [facility], [address] [place], [place] [address]   CT ANGIO HEAD NECK W WO CM  Result Date: 02/17/2023 CLINICAL DATA:  Vertigo, central EXAM: CT ANGIOGRAPHY HEAD AND NECK WITH AND WITHOUT CONTRAST TECHNIQUE: Multidetector CT imaging of the head and neck was  performed using the standard protocol during bolus administration of intravenous contrast. Multiplanar CT image reconstructions and MIPs were obtained to evaluate the vascular anatomy. Carotid stenosis measurements (when applicable) are obtained utilizing NASCET criteria, using the distal internal carotid diameter as the denominator. RADIATION DOSE REDUCTION: This exam was performed according to the departmental dose-optimization program which includes automated exposure control, adjustment of the mA and/or kV according to patient size and/or use of iterative reconstruction technique. CONTRAST:  75mL OMNIPAQUE IOHEXOL 350 MG/ML SOLN COMPARISON:  MRA September 16, 2022. FINDINGS: CT HEAD FINDINGS Brain: No evidence of acute large vascular territory infarction, hemorrhage, hydrocephalus, extra-axial collection or mass lesion/mass effect. Remote right PCA territory infarct. Vascular: See below. Skull: No acute fracture. Sinuses/Orbits: Largely clear sinuses.  No acute orbital findings. Other: No mastoid effusions. Review of the MIP images confirms the above findings CTA NECK FINDINGS Aortic arch: Aortic atherosclerosis. Visualized great vessel origins are patent without significant stenosis. Right carotid system: Atherosclerosis involving the carotid bifurcation and proximal ICA with approximately 40% stenosis. Left carotid system: Atherosclerosis at the carotid bifurcation without greater than 50% stenosis. Vertebral arteries: Both vertebral  arteries are patent. 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 (Series 13, image 116). Skeleton: Multilevel severe degenerative change including degenerative disc disease and facet/uncovertebral hypertrophy with varying degrees of neural foraminal stenosis. Other neck: No acute abnormality on limited assessment. Upper chest: Lung apices are clear. Review of the MIP images confirms the above findings CTA HEAD FINDINGS Anterior circulation: Bilateral intracranial ICAs, MCAs, and ACAs are patent. 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 The stent appears patent. Posterior circulation: Bilateral intradural vertebral arteries are patent with mild atherosclerotic narrowing. Basilar artery and bilateral posterior cerebral arteries are patent without proximal hemodynamically significant stenosis. Venous sinuses: As permitted by contrast timing, patent. Review of the MIP images confirms the above findings IMPRESSION: 1. No emergent large vessel occlusion. 2. 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 3. Approximately 40% stenosis of the proximal right ICA in the neck. 4. When comparing across modalities, similar versus slightly decreased size of a 2-3 mm inferiorly MRA September 16, 2022. 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 The stent appears patent. Electronically Signed   By: Feliberto Harts M.D.   On: 02/17/2023 17:03   DG Elbow Complete Right  Result Date: 02/17/2023 CLINICAL DATA:  Right arm pain after falling. Patient had a skin tear with bleeding on the posterior aspect of his right elbow. EXAM: RIGHT ELBOW - COMPLETE 3+ VIEW COMPARISON:  None Available. FINDINGS: There is diffuse decreased bone mineralization.  Mild-to-moderate medial and lateral elbow joint space narrowing, greatest at the medial aspect of the trochlea-coronoid articulation. Mild radial head-neck junction degenerative spurring, greatest at the articulation with the radial notch of the ulna. Mild chronic enthesopathic change at the common flexor tendon origin at the medial epicondyle greater than the common extensor tendon origin at the lateral epicondyle. There is soft tissue attenuation, irregularity, and mild subcutaneous air at the posterior and medial aspects of the elbow consistent with reported wound. No acute fracture or dislocation. IMPRESSION: Soft tissue attenuation, irregularity, and mild subcutaneous air at the posterior and medial aspects of the elbow consistent with the reported wound. 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 No acute fracture. Electronically Signed   By: Neita Garnet M.D.   On: 02/17/2023 15:05   DG Shoulder Right  Result Date: 02/17/2023 CLINICAL DATA:  Dizziness and fall. Recurrent falls recently. Head laceration, bleeding controlled. Skin tears to right arm - right anterior skin tears on right shoulder -Hx of right rotator cuff surgery EXAM: RIGHT SHOULDER - 2+ VIEW COMPARISON:  None available FINDINGS: No fracture or dislocation. Soft tissues are unremarkable. Moderate degenerative changes of the glenohumeral and acromioclavicular joints. Prominent subacromial spur is present. Irregularity of the greater tuberosity consistent with rotator cuff tendinopathy. Right chest port is seen terminating in the region of the upper SVC. IMPRESSION: No acute abnormality of the right shoulder. Degenerative changes as discussed above. Electronically Signed   By: Acquanetta Belling M.D.   On: 02/17/2023 14:18    Pending Labs Unresulted Labs (From admission, onward)     Start     Ordered   02/24/23 0500  Creatinine, serum  (enoxaparin (LOVENOX)    CrCl >/= 30 ml/min)  Weekly,   R     Comments: while on  enoxaparin therapy    02/17/23 2000   02/18/23 0500  CBC  Tomorrow morning,   R        02/17/23 2000   02/18/23 0500  Basic metabolic panel  Tomorrow morning,   R        02/17/23 2000   02/17/23 1959  CBC  (enoxaparin (LOVENOX)    CrCl >/= 30 ml/min)  Once,   R       Comments: Baseline for enoxaparin therapy IF NOT ALREADY DRAWN.  Notify MD if PLT < 100 K.    02/17/23 2000   02/17/23 1959  Creatinine, serum  (enoxaparin (LOVENOX)    CrCl >/= 30 ml/min)  Once,   R       Comments: Baseline for enoxaparin therapy IF NOT ALREADY DRAWN.    02/17/23 2000            Vitals/Pain Today's Vitals   02/17/23 1318 02/17/23 1530 02/17/23 1659 02/17/23 1730  BP: (!) 144/71 (!) 146/70 (!) 143/83 (!) 146/86  Pulse: 76 75 79 74  Resp: 16  16   Temp: 98.6 F (37 C)  98.6 F (37 C)   TempSrc: Oral  Oral   SpO2: 99% 99% 99% 98%    Isolation Precautions No active isolations  Medications Medications  lactated ringers infusion ( Intravenous New Bag/Given 02/17/23 1838)  amLODipine (NORVASC) tablet 5 mg (has no administration in time range)  atorvastatin (LIPITOR) tablet 40 mg (has no administration in time range)  escitalopram (LEXAPRO) tablet 20 mg (has no administration in time range)  mirtazapine (REMERON) tablet 7.5 mg (has no administration in time range)  levothyroxine (SYNTHROID) tablet 112 mcg (has no administration in time range)  megestrol (MEGACE ES) 625 MG/5ML suspension 625 mg (has no administration in time range)  lipase/protease/amylase (CREON) capsule 72,000 Units (has no administration in time range)  pantoprazole (PROTONIX) EC tablet 40 mg (has no administration in time range)  enoxaparin (LOVENOX) injection 40 mg (has no administration in time range)  acetaminophen (TYLENOL) tablet 650 mg (has no administration in time range)    Or  acetaminophen (TYLENOL) suppository 650 mg (has no administration in time range)  oxyCODONE (Oxy IR/ROXICODONE) immediate release tablet 5  mg (has no administration in time range)  ondansetron (ZOFRAN) tablet 4 mg (has no administration in time range)  Or  ondansetron [facility]) injection 4 mg (has no administration in time range)  sodium chloride 0.9 % bolus 1,000 mL (0 mLs Intravenous Stopped 02/17/23 1710)  iohexol (OMNIPAQUE) 350 MG/ML injection 75 mL (75 mLs Intravenous Contrast Given 02/17/23 1606)  lidocaine-EPINEPHrine (XYLOCAINE W/EPI) 2 %-1:200000 (PF) injection 10 mL (10 mLs Infiltration Given 02/17/23 1809)    Mobility walks with device     Focused Assessments Cardiac Assessment Handoff:    Lab Results  Component Value Date   CKTOTAL 3,599 (H) 07/10/2009   CKMB 31.3 (H) 07/10/2009   TROPONINI 0.02        NO INDICATION OF MYOCARDIAL INJURY. 07/10/2009   Lab Results  Component Value Date   DDIMER 1.35 (H) 07/22/2022   Does the Patient currently have chest pain? No    R Recommendations: See Admitting Provider Note  Report given to:   Additional Notes: aaox4, walks with walker at home, son and daughter at bedsisde

## 2023-02-17 NOTE — Telephone Encounter (Signed)
Pt's daughter called stating Pt fell at home on way to MD appt today, daughter is taking pt to [facility] ED for further evaluation.  Daughter stated pt hit his head and injured his arm.  Daughter wanted Dr. Mosetta Putt to be aware of the pt's falll and to cancel the pt's appt today.  Notified Dr. Antonietta Breach and Edwena Felty in Nutrition.

## 2023-02-17 NOTE — ED Triage Notes (Signed)
Pt arrived via EMS, from home. C/o dizziness and fall. Recurrent falls recently. Head laceration, bleeding controlled. Skin tears to right arm

## 2023-02-18 ENCOUNTER — Other Ambulatory Visit: Payer: Self-pay

## 2023-02-18 DIAGNOSIS — K8689 Other specified diseases of pancreas: Secondary | ICD-10-CM | POA: Insufficient documentation

## 2023-02-18 DIAGNOSIS — E860 Dehydration: Secondary | ICD-10-CM | POA: Diagnosis not present

## 2023-02-18 DIAGNOSIS — I6509 Occlusion and stenosis of unspecified vertebral artery: Secondary | ICD-10-CM | POA: Insufficient documentation

## 2023-02-18 LAB — CBC
HCT: 26.1 % — ABNORMAL LOW (ref 39.0–52.0)
Hemoglobin: 8.5 g/dL — ABNORMAL LOW (ref 13.0–17.0)
MCH: 26.3 pg (ref 26.0–34.0)
MCHC: 32.6 g/dL (ref 30.0–36.0)
MCV: 80.8 fL (ref 80.0–100.0)
Platelets: 233 10*3/uL (ref 150–400)
RBC: 3.23 MIL/uL — ABNORMAL LOW (ref 4.22–5.81)
RDW: 15.3 % (ref 11.5–15.5)
WBC: 7.8 10*3/uL (ref 4.0–10.5)
nRBC: 0 % (ref 0.0–0.2)

## 2023-02-18 LAB — BASIC METABOLIC PANEL
Anion gap: 7 (ref 5–15)
BUN: 18 mg/dL (ref 8–23)
CO2: 23 mmol/L (ref 22–32)
Calcium: 8.1 mg/dL — ABNORMAL LOW (ref 8.9–10.3)
Chloride: 101 mmol/L (ref 98–111)
Creatinine, Ser: 0.86 mg/dL (ref 0.61–1.24)
GFR, Estimated: >60 mL/min (ref >60)
Glucose, Bld: 93 mg/dL (ref 70–99)
Potassium: 3.3 mmol/L — ABNORMAL LOW (ref 3.5–5.1)
Sodium: 131 mmol/L — ABNORMAL LOW (ref 135–145)

## 2023-02-18 LAB — MAGNESIUM: Magnesium: 2.5 mg/dL — ABNORMAL HIGH (ref 1.7–2.4)

## 2023-02-18 MED ORDER — ASPIRIN 81 MG PO TBEC: 81.0000 mg | DELAYED_RELEASE_TABLET | Freq: Every day | ORAL | 0 refills | Status: AC

## 2023-02-18 MED ORDER — ADULT MULTIVITAMIN W/MINERALS CH
1.0000 | ORAL_TABLET | Freq: Every day | ORAL | Status: DC
  Administered 2023-02-18 – 2023-02-19 (×2): 1 via ORAL
  Filled 2023-02-18 (×2): qty 1

## 2023-02-18 MED ORDER — POTASSIUM CHLORIDE CRYS ER 20 MEQ PO TBCR
40.0000 meq | EXTENDED_RELEASE_TABLET | ORAL | Status: AC
  Administered 2023-02-18 (×2): 40 meq via ORAL
  Filled 2023-02-18 (×2): qty 2

## 2023-02-18 MED ORDER — ENSURE ENLIVE PO LIQD: 237.0000 mL | Freq: Two times a day (BID) | ORAL | 12 refills | Status: DC

## 2023-02-18 MED ORDER — MEGESTROL ACETATE 400 MG/10ML PO SUSP
400.0000 mg | Freq: Two times a day (BID) | ORAL | Status: DC
  Administered 2023-02-18 – 2023-02-19 (×3): 400 mg via ORAL
  Filled 2023-02-18 (×3): qty 10

## 2023-02-18 MED ORDER — CHLORHEXIDINE GLUCONATE CLOTH 2 % EX PADS
6.0000 | MEDICATED_PAD | Freq: Every day | CUTANEOUS | Status: DC
  Administered 2023-02-18 – 2023-02-19 (×2): 6 via TOPICAL

## 2023-02-18 MED ORDER — ENSURE ENLIVE PO LIQD
237.0000 mL | Freq: Two times a day (BID) | ORAL | Status: DC
  Administered 2023-02-18 – 2023-02-19 (×3): 237 mL via ORAL

## 2023-02-18 MED ORDER — MIRTAZAPINE 15 MG PO TABS
7.5000 mg | ORAL_TABLET | Freq: Every day | ORAL | Status: DC
  Administered 2023-02-18: 7.5 mg via ORAL
  Filled 2023-02-18: qty 1

## 2023-02-18 NOTE — Evaluation (Signed)
Physical Therapy Evaluation Patient Details Name: Angel Keller Sr. MRN: [id number] DOB: Dec 12, 1940 Today's Date: 02/18/2023  History of Present Illness  82 y.o. male with medical history significant of thoracic aortic aneurysm, CVA, hyperlipidemia, hypertension, pancreatic cancer who presented to the emergency department after a fall.  Patient was planning to go to a cancer center doctor's appointment when he felt lightheaded and fell to the ground hitting his right elbow shoulder. xrays negative for fracture  Clinical Impression  Pt admitted with above diagnosis.  Pt currently with functional limitations due to the deficits listed below (see PT Problem List). Pt will benefit from acute skilled PT to increase their independence and safety with mobility to allow discharge.   Pt agreeable to mobilize however only felt able to transfer to recliner.  Pt reports feeling very stiff and sore after fall prior to admission.  Pt also requiring at least mod assist for mobility at this time. Pt feels he will be able to return home, however uncertain if daughter will be able to provide current physical assist.  If daughter cannot assist, pt may benefit from post acute rehab.          Assistance Recommended at Discharge Intermittent Supervision/Assistance  If plan is discharge home, recommend the following:  Can travel by private vehicle  A lot of help with walking and/or transfers;Assistance with cooking/housework;Assist for transportation;Help with stairs or ramp for entrance        Equipment Recommendations None recommended by PT  Recommendations for Other Services       Functional Status Assessment Patient has had a recent decline in their functional status and demonstrates the ability to make significant improvements in function in a reasonable and predictable amount of time.     Precautions / Restrictions Precautions Precautions: Fall Precaution Comments: R shoulder and upper arm with skin  tears from fall      Mobility  Bed Mobility Overal bed mobility: Needs Assistance Bed Mobility: Supine to Sit     Supine to sit: Mod assist     General bed mobility comments: assist for LEs over EOB and scooting to EOB with bed pad    Transfers Overall transfer level: Needs assistance Equipment used: Rolling walker (2 wheels) Transfers: Sit to/from Stand, Bed to chair/wheelchair/BSC Sit to Stand: Mod assist   Step pivot transfers: Mod assist       General transfer comment: assist to rise and steady, posterior bias, difficulty accepting weight through Rt UE due to pain so encouraged more through Lt UE as pt requiring more stability, assist for weakness and stabilizing    Ambulation/Gait               General Gait Details: pt felt unable to tolerate  Stairs            Wheelchair Mobility     Tilt Bed    Modified Rankin (Stroke Patients Only)       Balance Overall balance assessment: Mild deficits observed, not formally tested, History of Falls (pt reports he does not know why he fell)                                           Pertinent Vitals/Pain Pain Assessment Pain Assessment: 0-10 Pain Score: 9  Pain Location: right shoulder Pain Descriptors / Indicators: Sore Pain Intervention(s): Repositioned, Monitored during session    Home Living  Family/patient expects to be discharged to:: Private residence Living Arrangements: Children Available Help at Discharge: Family;Available 24 hours/day (daughter) Type of Home: House Home Access: Stairs to enter Entrance Stairs-Rails: Lawyer of Steps: 3   Home Layout: One level Home Equipment: Agricultural consultant (2 wheels)      Prior Function Prior Level of Function : Needs assist             Mobility Comments: independent with ambulation; ADLs Comments: daughter drives him and does IADLs ; Ind with ADLs     Hand Dominance        Extremity/Trunk  Assessment   Upper Extremity Assessment Upper Extremity Assessment: RUE deficits/detail RUE: Unable to fully assess due to pain;Shoulder pain at rest    Lower Extremity Assessment Lower Extremity Assessment: Generalized weakness       Communication   Communication: HOH  Cognition Arousal/Alertness: Awake/alert Behavior During Therapy: WFL for tasks assessed/performed Overall Cognitive Status: Within Functional Limits for tasks assessed                                          General Comments      Exercises     Assessment/Plan    PT Assessment Patient needs continued PT services  PT Problem List Decreased strength;Decreased activity tolerance;Decreased balance;Decreased mobility;Decreased knowledge of use of DME;Pain;Decreased skin integrity       PT Treatment Interventions DME instruction;Gait training;Balance training;Therapeutic exercise;Functional mobility training;Therapeutic activities;Patient/family education    PT Goals (Current goals can be found in the Care Plan section)  Acute Rehab PT Goals PT Goal Formulation: With patient Time For Goal Achievement: 03/04/23 Potential to Achieve Goals: Good    Frequency Min 1X/week     Co-evaluation               AM-PAC PT "6 Clicks" Mobility  Outcome Measure Help needed turning from your back to your side while in a flat bed without using bedrails?: A Lot Help needed moving from lying on your back to sitting on the side of a flat bed without using bedrails?: A Lot Help needed moving to and from a bed to a chair (including a wheelchair)?: A Lot Help needed standing up from a chair using your arms (e.g., wheelchair or bedside chair)?: A Lot Help needed to walk in hospital room?: A Lot Help needed climbing 3-5 steps with a railing? : Total 6 Click Score: 11    End of Session Equipment Utilized During Treatment: Gait belt Activity Tolerance: Patient tolerated treatment well Patient left: with  call bell/phone within reach;in chair;with chair alarm set Nurse Communication: Mobility status PT Visit Diagnosis: Difficulty in walking, not elsewhere classified (R26.2);Muscle weakness (generalized) (M62.81);Unsteadiness on feet (R26.81)    Time: 7106-2694 PT Time Calculation (min) (ACUTE ONLY): 17 min   Charges:   PT Evaluation $PT Eval Low Complexity: 1 Low   PT General Charges $$ ACUTE PT VISIT: 1 Visit        Thomasene Mohair PT, DPT Physical Therapist Acute Rehabilitation Services Office: [phone]   Janan Halter [place] 02/18/2023, 12:27 PM

## 2023-02-18 NOTE — TOC Transition Note (Signed)
Transition of Care [facility]) - CM/SW Discharge Note   Patient Details  Name: Angel RINGOLD Sr. MRN: [id number] Date of Birth: 05/14/1941  Transition of Care [facility]) CM/SW Contact:  Carmina Miller, LCSWA Phone Number: 02/18/2023, 4:42 PM   Clinical Narrative:     CSW spoke with pt's daughter concerning HH PT recommendation, daughter states she has no preference. CSW spoke with [place] at [facility], agreeable to providing services with a TTS 24-48 hours, daughter provided with this information, she requests to be point of contact.     Barriers to Discharge: No Barriers Identified   Patient Goals and CMS Choice      Discharge Placement                         Discharge Plan and Services Additional resources added to the After Visit Summary for   In-house Referral: Clinical Social Work                        HH Arranged: PT HH Agency: [facility] Date [facility] Agency Contacted: 02/18/23 Time HH Agency Contacted: 1641 Representative spoke with at [facility] Agency: Kandee Keen  Social Determinants of Health (SDOH) Interventions SDOH Screenings   Food Insecurity: No Food Insecurity (02/18/2023)  Housing: Low Risk  (02/18/2023)  Transportation Needs: No Transportation Needs (02/18/2023)  Utilities: Not At Risk (02/18/2023)  Depression (PHQ2-9): Medium Risk (01/02/2023)  Financial Resource Strain: High Risk (03/10/2022)  Tobacco Use: Medium Risk (02/17/2023)     Readmission Risk Interventions    11/21/2021    1:06 PM  Readmission Risk Prevention Plan  Transportation Screening Complete  Medication Review (RN Care Manager) Complete  PCP or Specialist appointment within 3-5 days of discharge Complete  HRI or Home Care Consult Complete  Palliative Care Screening Not Applicable  Skilled Nursing Facility Not Applicable

## 2023-02-18 NOTE — Discharge Summary (Addendum)
Physician Discharge Summary  Angel Keller ZOX:[id number] DOB: 04-07-1941 DOA: 02/17/2023  PCP: Carlean Jews, NP  Admit date: 02/17/2023 Discharge date: 02/18/2023 Discharging to: home   Discharge Diagnoses:   Principal Problem:   Dehydration Active Problems:   Pancreatic cancer [facility])   Vertebral artery stenosis   Pancreatic insufficiency     Hospital Course:  This is an 82 year old male with a history of pancreatic cancer (treated) pancreatic insufficiency, thoracic aortic aneurysm who presents to the hospital after a fall. ED exam was suggestive of dehydration and the patient was admitted for IV fluids.  Principal Problem:   Dehydration -Patient admits that he has not been eating and drinking very well lately - Today he states that his oral intake has improved -Appears to be much better hydrated -Will DC IV fluids today -okay to discharge home  Active Problems:  Fall - Appreciate PT eval-Home health PT has been recommended  Hypokalemia - Has been replaced    Vertebral artery stenosis - h/o CVA -Has unilateral left V2 moderate to severe stenosis secondary to an osteophyte (not an acute issue) - No further workup required at this time -Of note the patient has a history of a stroke in February 2024 and a baby aspirin was recommended at that time -I have re-ordered a baby aspirin daily    Pancreatic insufficiency -History of pancreatic cancer in December 2022-status post Whipple's and neoadjuvant chemotherapy - He received radiation on 09/30/2022 for a local recurrence -Continue Creon  Myelodysplastic syndrome    Discharge Instructions  Discharge Instructions     No wound care   Complete by: As directed       Allergies as of 02/18/2023   No Known Allergies      Medication List     TAKE these medications    amLODipine 5 MG tablet Commonly known as: NORVASC TAKE 1 TABLET (5 MG TOTAL) BY MOUTH DAILY.   atorvastatin 40 MG tablet Commonly known as:  LIPITOR TAKE 1 TABLET (40 MG TOTAL) BY MOUTH AT BEDTIME.   cyanocobalamin 1000 MCG tablet Commonly known as: VITAMIN B12 Take 1,000 mcg by mouth 2 (two) times daily.   escitalopram 20 MG tablet Commonly known as: LEXAPRO Take 1 tablet (20 mg total) by mouth daily.   feeding supplement Liqd Take 237 mLs by mouth 2 (two) times daily between meals. Start taking on: February 19, 2023   levothyroxine 112 MCG tablet Commonly known as: SYNTHROID Take 1 tablet (112 mcg total) by mouth daily before breakfast.   lidocaine-prilocaine cream Commonly known as: EMLA Apply topically to affected area(s) as needed. What changed: reasons to take this   lipase/protease/amylase 81191 UNITS Cpep capsule Commonly known as: CREON Take 1 capsule (36,000 Units total) by mouth 3 (three) times daily before meals. What changed: how much to take   loperamide 2 MG capsule Commonly known as: IMODIUM Take 1 capsule (2 mg total) by mouth 4 (four) times daily as needed for diarrhea or loose stools.   megestrol 625 MG/5ML suspension Commonly known as: MEGACE ES Take 5 mLs (625 mg total) by mouth daily.   mirtazapine 7.5 MG tablet Commonly known as: REMERON Take 1 tablet (7.5 mg total) by mouth at bedtime.   pantoprazole 40 MG tablet Commonly known as: PROTONIX Take 1 tablet (40 mg total) by mouth 2 (two) times daily.   prochlorperazine 10 MG tablet Commonly known as: COMPAZINE Take 1 tablet (10 mg total) by mouth every 6 (six) hours as needed (  Nausea or vomiting).   Promacta 50 MG tablet Generic drug: eltrombopag Take 1 tablet (50 mg total) by mouth daily. Take on an empty stomach 1 hour before a meal or 2 hours after   Vitamin D3 50 MCG (2000 UT) Tabs Take 2,000 Units by mouth daily.            The results of significant diagnostics from this hospitalization (including imaging, microbiology, ancillary and laboratory) are listed below for reference.    CT ANGIO HEAD NECK W WO CM  Result  Date: 02/17/2023 CLINICAL DATA:  Vertigo, central EXAM: CT ANGIOGRAPHY HEAD AND NECK WITH AND WITHOUT CONTRAST TECHNIQUE: Multidetector CT imaging of the head and neck was performed using the standard protocol during bolus administration of intravenous contrast. Multiplanar CT image reconstructions and MIPs were obtained to evaluate the vascular anatomy. Carotid stenosis measurements (when applicable) are obtained utilizing NASCET criteria, using the distal internal carotid diameter as the denominator. RADIATION DOSE REDUCTION: This exam was performed according to the departmental dose-optimization program which includes automated exposure control, adjustment of the mA and/or kV according to patient size and/or use of iterative reconstruction technique. CONTRAST:  75mL OMNIPAQUE IOHEXOL 350 MG/ML SOLN COMPARISON:  MRA September 16, 2022. FINDINGS: CT HEAD FINDINGS Brain: No evidence of acute large vascular territory infarction, hemorrhage, hydrocephalus, extra-axial collection or mass lesion/mass effect. Remote right PCA territory infarct. Vascular: See below. Skull: No acute fracture. Sinuses/Orbits: Largely clear sinuses.  No acute orbital findings. Other: No mastoid effusions. Review of the MIP images confirms the above findings CTA NECK FINDINGS Aortic arch: Aortic atherosclerosis. Visualized great vessel origins are patent without significant stenosis. Right carotid system: Atherosclerosis involving the carotid bifurcation and proximal ICA with approximately 40% stenosis. Left carotid system: Atherosclerosis at the carotid bifurcation without greater than 50% stenosis. Vertebral arteries: Both vertebral arteries are patent. 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 (Series 13, image 116). Skeleton: Multilevel severe degenerative change including degenerative disc disease and facet/uncovertebral hypertrophy with varying degrees of neural  foraminal stenosis. Other neck: No acute abnormality on limited assessment. Upper chest: Lung apices are clear. Review of the MIP images confirms the above findings CTA HEAD FINDINGS Anterior circulation: Bilateral intracranial ICAs, MCAs, and ACAs are patent. 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 The stent appears patent. Posterior circulation: Bilateral intradural vertebral arteries are patent with mild atherosclerotic narrowing. Basilar artery and bilateral posterior cerebral arteries are patent without proximal hemodynamically significant stenosis. Venous sinuses: As permitted by contrast timing, patent. Review of the MIP images confirms the above findings IMPRESSION: 1. No emergent large vessel occlusion. 2. 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 3. Approximately 40% stenosis of the proximal right ICA in the neck. 4. When comparing across modalities, similar versus slightly decreased size of a 2-3 mm inferiorly MRA September 16, 2022. 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 The stent appears patent. Electronically Signed   By: Feliberto Harts M.D.   On: 02/17/2023 17:03   DG Elbow Complete Right  Result Date: 02/17/2023 CLINICAL DATA:  Right arm pain after falling. Patient had a skin tear with bleeding on the posterior aspect of his right elbow. EXAM: RIGHT ELBOW - COMPLETE 3+ VIEW COMPARISON:  None  Available. FINDINGS: There is diffuse decreased bone mineralization. Mild-to-moderate medial and lateral elbow joint space narrowing, greatest at the medial aspect of the trochlea-coronoid articulation. Mild radial head-neck junction degenerative spurring, greatest at the articulation with the radial notch of the ulna. Mild chronic enthesopathic change at the common flexor tendon origin at  the medial epicondyle greater than the common extensor tendon origin at the lateral epicondyle. There is soft tissue attenuation, irregularity, and mild subcutaneous air at the posterior and medial aspects of the elbow consistent with reported wound. No acute fracture or dislocation. IMPRESSION: Soft tissue attenuation, irregularity, and mild subcutaneous air at the posterior and medial aspects of the elbow consistent with the reported wound. 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 No acute fracture. Electronically Signed   By: Neita Garnet M.D.   On: 02/17/2023 15:05   DG Shoulder Right  Result Date: 02/17/2023 CLINICAL DATA:  Dizziness and fall. Recurrent falls recently. Head laceration, bleeding controlled. Skin tears to right arm - right anterior skin tears on right shoulder -Hx of right rotator cuff surgery EXAM: RIGHT SHOULDER - 2+ VIEW COMPARISON:  None available FINDINGS: No fracture or dislocation. Soft tissues are unremarkable. Moderate degenerative changes of the glenohumeral and acromioclavicular joints. Prominent subacromial spur is present. Irregularity of the greater tuberosity consistent with rotator cuff tendinopathy. Right chest port is seen terminating in the region of the upper SVC. IMPRESSION: No acute abnormality of the right shoulder. Degenerative changes as discussed above. Electronically Signed   By: Acquanetta Belling M.D.   On: 02/17/2023 14:18   MR Abdomen W Wo Contrast  Result Date: 02/15/2023 CLINICAL DATA:  Pancreatic cancer EXAM: MRI ABDOMEN WITHOUT AND WITH CONTRAST TECHNIQUE: Multiplanar multisequence MR imaging of the abdomen was performed both before and after the administration of intravenous contrast. CONTRAST:  5mL GADAVIST GADOBUTROL 1 MMOL/ML IV SOLN COMPARISON:  CT chest abdomen pelvis, 12/15/2022, 08/16/2022 PET-CT, 07/28/2023 FINDINGS: Examination is significantly limited by breath motion artifact, which degrades essentially all sequences,  particularly multiphasic contrast enhanced sequences. Lower chest: No acute abnormality. Hepatobiliary: Hyperemia of the left lobe of the liver, likely secondary to radiation port. Status post cholecystectomy and hepatojejunostomy. Unchanged postoperative pneumobilia. Pancreas: Status post Whipple pancreaticoduodenectomy, in general poorly assessed secondary to breath motion artifact. No obvious soft tissue or abnormality of the pancreaticojejunostomy. Mildly atrophic pancreas. No pancreatic ductal dilatation. Spleen: Normal in size without significant abnormality. Adrenals/Urinary Tract: Adrenal glands are unremarkable. Kidneys are normal, without renal calculi, solid lesion, or hydronephrosis. Stomach/Bowel: Status post distal gastrectomy and gastrojejunostomy. No evidence of bowel wall thickening, distention, or inflammatory changes. Vascular/Lymphatic: No significant vascular findings are present. A previously described soft tissue nodule or lymph node interposed between the aorta, portal vein, left renal vein, and superior mesenteric artery is not clearly visualized on today's examination (series 14, image 45). No definitely enlarged abdominal lymph nodes. Other: No abdominal wall hernia or abnormality. No ascites. Musculoskeletal: No acute or significant osseous findings. IMPRESSION: 1. Examination is significantly limited by breath motion artifact, which degrades essentially all sequences, particularly multiphasic contrast enhanced sequences. 2. Status post Whipple pancreaticoduodenectomy, in general poorly assessed secondary to breath motion artifact. No obvious soft tissue or abnormality of the pancreaticojejunostomy. 3. A previously described soft tissue nodule or lymph node interposed between the aorta, portal vein, left renal vein, and superior mesenteric artery is not clearly visualized on today's examination, possibly resolved in response to treatment. No definitely enlarged abdominal lymph nodes. 4.  No evidence of recurrent or metastatic  disease in the abdomen. Electronically Signed   By: Jearld Lesch M.D.   On: 02/15/2023 16:14   MR 3D Recon At Scanner  Result Date: 02/15/2023 CLINICAL DATA:  Pancreatic cancer EXAM: MRI ABDOMEN WITHOUT AND WITH CONTRAST TECHNIQUE: Multiplanar multisequence MR imaging of the abdomen was performed both before and after the administration of intravenous contrast. CONTRAST:  5mL GADAVIST GADOBUTROL 1 MMOL/ML IV SOLN COMPARISON:  CT chest abdomen pelvis, 12/15/2022, 08/16/2022 PET-CT, 07/28/2023 FINDINGS: Examination is significantly limited by breath motion artifact, which degrades essentially all sequences, particularly multiphasic contrast enhanced sequences. Lower chest: No acute abnormality. Hepatobiliary: Hyperemia of the left lobe of the liver, likely secondary to radiation port. Status post cholecystectomy and hepatojejunostomy. Unchanged postoperative pneumobilia. Pancreas: Status post Whipple pancreaticoduodenectomy, in general poorly assessed secondary to breath motion artifact. No obvious soft tissue or abnormality of the pancreaticojejunostomy. Mildly atrophic pancreas. No pancreatic ductal dilatation. Spleen: Normal in size without significant abnormality. Adrenals/Urinary Tract: Adrenal glands are unremarkable. Kidneys are normal, without renal calculi, solid lesion, or hydronephrosis. Stomach/Bowel: Status post distal gastrectomy and gastrojejunostomy. No evidence of bowel wall thickening, distention, or inflammatory changes. Vascular/Lymphatic: No significant vascular findings are present. A previously described soft tissue nodule or lymph node interposed between the aorta, portal vein, left renal vein, and superior mesenteric artery is not clearly visualized on today's examination (series 14, image 45). No definitely enlarged abdominal lymph nodes. Other: No abdominal wall hernia or abnormality. No ascites. Musculoskeletal: No acute or significant osseous  findings. IMPRESSION: 1. Examination is significantly limited by breath motion artifact, which degrades essentially all sequences, particularly multiphasic contrast enhanced sequences. 2. Status post Whipple pancreaticoduodenectomy, in general poorly assessed secondary to breath motion artifact. No obvious soft tissue or abnormality of the pancreaticojejunostomy. 3. A previously described soft tissue nodule or lymph node interposed between the aorta, portal vein, left renal vein, and superior mesenteric artery is not clearly visualized on today's examination, possibly resolved in response to treatment. No definitely enlarged abdominal lymph nodes. 4. No evidence of recurrent or metastatic disease in the abdomen. Electronically Signed   By: Jearld Lesch M.D.   On: 02/15/2023 16:14   Labs:   Basic Metabolic Panel: Recent Labs  Lab 02/13/23 1534 02/17/23 1456 02/18/23 0500  NA 132* 130* 131*  K 4.0 3.4* 3.3*  CL 102 101 101  CO2 22 21* 23  GLUCOSE 158* 102* 93  BUN 23 30* 18  CREATININE 1.14 1.16 0.86  CALCIUM 8.4* 8.2* 8.1*  MG  --   --  2.5*     CBC: Recent Labs  Lab 02/13/23 1534 02/17/23 1456 02/18/23 0500  WBC 8.5 7.7 7.8  NEUTROABS 6.5  --   --   HGB 8.5* 7.7* 8.5*  HCT 26.0* 23.8* 26.1*  MCV 81.3 80.7 80.8  PLT 214 209 233         SIGNED:   Calvert Cantor, MD  [facility] 02/18/2023, 3:35 PM

## 2023-02-18 NOTE — Progress Notes (Signed)
Initial Nutrition Assessment  DOCUMENTATION CODES:   Underweight  INTERVENTION:   -Ensure Plus High Protein po BID, each supplement provides 350 kcal and 20 grams of protein.   -Multivitamin with minerals daily  NUTRITION DIAGNOSIS:   Increased nutrient needs related to chronic illness as evidenced by estimated needs.  GOAL:   Patient will meet greater than or equal to 90% of their needs  MONITOR:   PO intake, Supplement acceptance, Labs, Weight trends, I & O's  REASON FOR ASSESSMENT:   Consult Assessment of nutrition requirement/status  ASSESSMENT:   82 y.o. male with medical history significant of thoracic aortic aneurysm, pancreatic cancer who presented to the emergency department after a fall. Admitted for IV hydration.  Patient currently consuming 50% of meals today.  Will likely benefit from nutritional supplements given increased weakness and falls at home. Will order Ensure supplements and daily MVI.  [facility] when undergoing chemo treatment for pancreatic cancer. Underweight Whipple surgery in April 2023.   Per weight records, pt's weight is increased since 6/12.  Current weight: 134 lbs  Medications: Creon, Megace, Remeron, KLOR-CON, IV Mg sulfate   Labs reviewed: Low Na Low K  Elevated Mg  NUTRITION - FOCUSED PHYSICAL EXAM:  Unable to complete, RD working remotely.   Diet Order:   Diet Order             Diet regular Room service appropriate? Yes; Fluid consistency: Thin  Diet effective now                   EDUCATION NEEDS:   Not appropriate for education at this time  Skin:  Skin Assessment: Reviewed RN Assessment  Last BM:  7/4  Height:   Ht Readings from Last 1 Encounters:  02/18/23 6' (1.829 m)    Weight:   Wt Readings from Last 1 Encounters:  02/18/23 60.8 kg    BMI:  Body mass index is 18.18 kg/m.  Estimated Nutritional Needs:   Kcal:  2050-2250  Protein:   95-105g  Fluid:  2L/day    Tilda Franco, MS, RD, LDN Inpatient Clinical Dietitian Contact information available via Amion

## 2023-02-18 NOTE — Progress Notes (Signed)
Pt scheduled for discharge. When attempted to review discharge instructions, pt's daughter voiced concerns about pt not being able to ambulate and therefore she felt unsafe taking pt home. Daughter also voiced concerns that wound care had not been performed to right arm wound that was sustained during pt's fall at home. This Clinical research associate then performed wound care with family present. Dr. Butler Denmark made aware. Pt remained inpatient at this time.

## 2023-02-19 MED ORDER — HEPARIN SOD (PORK) LOCK FLUSH 100 UNIT/ML IV SOLN
500.0000 [IU] | INTRAVENOUS | Status: DC | PRN
  Filled 2023-02-19: qty 5

## 2023-02-20 ENCOUNTER — Other Ambulatory Visit: Payer: Self-pay

## 2023-02-20 ENCOUNTER — Encounter (HOSPITAL_COMMUNITY): Payer: Self-pay | Admitting: Emergency Medicine

## 2023-02-20 ENCOUNTER — Telehealth: Payer: Self-pay

## 2023-02-20 ENCOUNTER — Emergency Department (HOSPITAL_COMMUNITY): Payer: Medicare HMO

## 2023-02-20 ENCOUNTER — Inpatient Hospital Stay (HOSPITAL_COMMUNITY)
Admission: EM | Admit: 2023-02-20 | Discharge: 2023-02-24 | DRG: 602 | Disposition: A | Discharge status: Home Health | Payer: Medicare HMO | Attending: Internal Medicine | Admitting: Internal Medicine

## 2023-02-20 ENCOUNTER — Other Ambulatory Visit (HOSPITAL_COMMUNITY): Payer: Self-pay

## 2023-02-20 DIAGNOSIS — Z8673 Personal history of transient ischemic attack (TIA), and cerebral infarction without residual deficits: Secondary | ICD-10-CM

## 2023-02-20 DIAGNOSIS — Z811 Family history of alcohol abuse and dependence: Secondary | ICD-10-CM

## 2023-02-20 DIAGNOSIS — D469 Myelodysplastic syndrome, unspecified: Secondary | ICD-10-CM | POA: Diagnosis present

## 2023-02-20 DIAGNOSIS — I10 Essential (primary) hypertension: Secondary | ICD-10-CM | POA: Diagnosis present

## 2023-02-20 DIAGNOSIS — Z7982 Long term (current) use of aspirin: Secondary | ICD-10-CM

## 2023-02-20 DIAGNOSIS — R296 Repeated falls: Secondary | ICD-10-CM | POA: Diagnosis present

## 2023-02-20 DIAGNOSIS — R531 Weakness: Secondary | ICD-10-CM

## 2023-02-20 DIAGNOSIS — Z5986 Financial insecurity: Secondary | ICD-10-CM

## 2023-02-20 DIAGNOSIS — E785 Hyperlipidemia, unspecified: Secondary | ICD-10-CM | POA: Diagnosis present

## 2023-02-20 DIAGNOSIS — Y92009 Unspecified place in unspecified non-institutional (private) residence as the place of occurrence of the external cause: Secondary | ICD-10-CM | POA: Diagnosis not present

## 2023-02-20 DIAGNOSIS — Z801 Family history of malignant neoplasm of trachea, bronchus and lung: Secondary | ICD-10-CM

## 2023-02-20 DIAGNOSIS — I7121 Aneurysm of the ascending aorta, without rupture: Secondary | ICD-10-CM | POA: Diagnosis present

## 2023-02-20 DIAGNOSIS — W1830XA Fall on same level, unspecified, initial encounter: Secondary | ICD-10-CM | POA: Diagnosis present

## 2023-02-20 DIAGNOSIS — C259 Malignant neoplasm of pancreas, unspecified: Secondary | ICD-10-CM | POA: Diagnosis present

## 2023-02-20 DIAGNOSIS — K219 Gastro-esophageal reflux disease without esophagitis: Secondary | ICD-10-CM | POA: Diagnosis present

## 2023-02-20 DIAGNOSIS — Z9221 Personal history of antineoplastic chemotherapy: Secondary | ICD-10-CM

## 2023-02-20 DIAGNOSIS — R9431 Abnormal electrocardiogram [ECG] [EKG]: Secondary | ICD-10-CM | POA: Diagnosis present

## 2023-02-20 DIAGNOSIS — R64 Cachexia: Secondary | ICD-10-CM | POA: Diagnosis present

## 2023-02-20 DIAGNOSIS — Z923 Personal history of irradiation: Secondary | ICD-10-CM

## 2023-02-20 DIAGNOSIS — S0181XA Laceration without foreign body of other part of head, initial encounter: Secondary | ICD-10-CM | POA: Diagnosis present

## 2023-02-20 DIAGNOSIS — E860 Dehydration: Secondary | ICD-10-CM | POA: Diagnosis present

## 2023-02-20 DIAGNOSIS — Z87891 Personal history of nicotine dependence: Secondary | ICD-10-CM

## 2023-02-20 DIAGNOSIS — S51011A Laceration without foreign body of right elbow, initial encounter: Secondary | ICD-10-CM | POA: Diagnosis present

## 2023-02-20 DIAGNOSIS — L03113 Cellulitis of right upper limb: Secondary | ICD-10-CM | POA: Diagnosis present

## 2023-02-20 DIAGNOSIS — F32A Depression, unspecified: Secondary | ICD-10-CM | POA: Diagnosis present

## 2023-02-20 DIAGNOSIS — I6502 Occlusion and stenosis of left vertebral artery: Secondary | ICD-10-CM | POA: Diagnosis present

## 2023-02-20 DIAGNOSIS — Z79890 Hormone replacement therapy (postmenopausal): Secondary | ICD-10-CM | POA: Diagnosis not present

## 2023-02-20 DIAGNOSIS — F419 Anxiety disorder, unspecified: Secondary | ICD-10-CM | POA: Diagnosis present

## 2023-02-20 DIAGNOSIS — Z1152 Encounter for screening for COVID-19: Secondary | ICD-10-CM | POA: Diagnosis not present

## 2023-02-20 DIAGNOSIS — E039 Hypothyroidism, unspecified: Secondary | ICD-10-CM | POA: Diagnosis present

## 2023-02-20 DIAGNOSIS — Z681 Body mass index (BMI) 19 or less, adult: Secondary | ICD-10-CM | POA: Diagnosis not present

## 2023-02-20 DIAGNOSIS — R627 Adult failure to thrive: Secondary | ICD-10-CM | POA: Diagnosis present

## 2023-02-20 DIAGNOSIS — S40211A Abrasion of right shoulder, initial encounter: Secondary | ICD-10-CM | POA: Diagnosis present

## 2023-02-20 DIAGNOSIS — E43 Unspecified severe protein-calorie malnutrition: Secondary | ICD-10-CM | POA: Diagnosis present

## 2023-02-20 DIAGNOSIS — E876 Hypokalemia: Secondary | ICD-10-CM | POA: Diagnosis present

## 2023-02-20 DIAGNOSIS — Z803 Family history of malignant neoplasm of breast: Secondary | ICD-10-CM

## 2023-02-20 DIAGNOSIS — Z79899 Other long term (current) drug therapy: Secondary | ICD-10-CM

## 2023-02-20 DIAGNOSIS — Z90411 Acquired partial absence of pancreas: Secondary | ICD-10-CM

## 2023-02-20 LAB — URINALYSIS, W/ REFLEX TO CULTURE (INFECTION SUSPECTED)
Bacteria, UA: NONE SEEN
Bilirubin Urine: NEGATIVE
Glucose, UA: NEGATIVE mg/dL
Ketones, ur: NEGATIVE mg/dL
Leukocytes,Ua: NEGATIVE
Nitrite: NEGATIVE
Protein, ur: NEGATIVE mg/dL
Specific Gravity, Urine: 1.009 (ref 1.005–1.030)
pH: 5 (ref 5.0–8.0)

## 2023-02-20 LAB — COMPREHENSIVE METABOLIC PANEL
ALT: 42 U/L (ref 0–44)
AST: 40 U/L (ref 15–41)
Albumin: 2.6 g/dL — ABNORMAL LOW (ref 3.5–5.0)
Alkaline Phosphatase: 129 U/L — ABNORMAL HIGH (ref 38–126)
Anion gap: 9 (ref 5–15)
BUN: 19 mg/dL (ref 8–23)
CO2: 22 mmol/L (ref 22–32)
Calcium: 8.3 mg/dL — ABNORMAL LOW (ref 8.9–10.3)
Chloride: 102 mmol/L (ref 98–111)
Creatinine, Ser: 1.14 mg/dL (ref 0.61–1.24)
GFR, Estimated: >60 mL/min (ref >60)
Glucose, Bld: 108 mg/dL — ABNORMAL HIGH (ref 70–99)
Potassium: 4 mmol/L (ref 3.5–5.1)
Sodium: 133 mmol/L — ABNORMAL LOW (ref 135–145)
Total Bilirubin: 0.3 mg/dL (ref 0.3–1.2)
Total Protein: 6.9 g/dL (ref 6.5–8.1)

## 2023-02-20 LAB — CBC WITH DIFFERENTIAL/PLATELET
Abs Immature Granulocytes: 0.14 10*3/uL — ABNORMAL HIGH (ref 0.00–0.07)
Basophils Absolute: 0 10*3/uL (ref 0.0–0.1)
Basophils Relative: 0 %
Eosinophils Absolute: 0 10*3/uL (ref 0.0–0.5)
Eosinophils Relative: 0 %
HCT: 27.3 % — ABNORMAL LOW (ref 39.0–52.0)
Hemoglobin: 8.8 g/dL — ABNORMAL LOW (ref 13.0–17.0)
Immature Granulocytes: 1 %
Lymphocytes Relative: 9 %
Lymphs Abs: 1 10*3/uL (ref 0.7–4.0)
MCH: 26.3 pg (ref 26.0–34.0)
MCHC: 32.2 g/dL (ref 30.0–36.0)
MCV: 81.5 fL (ref 80.0–100.0)
Monocytes Absolute: 1 10*3/uL (ref 0.1–1.0)
Monocytes Relative: 9 %
Neutro Abs: 8.9 10*3/uL — ABNORMAL HIGH (ref 1.7–7.7)
Neutrophils Relative %: 81 %
Platelets: 249 10*3/uL (ref 150–400)
RBC: 3.35 MIL/uL — ABNORMAL LOW (ref 4.22–5.81)
RDW: 15.6 % — ABNORMAL HIGH (ref 11.5–15.5)
WBC: 11.1 10*3/uL — ABNORMAL HIGH (ref 4.0–10.5)
nRBC: 0 % (ref 0.0–0.2)

## 2023-02-20 LAB — TROPONIN I (HIGH SENSITIVITY): Troponin I (High Sensitivity): 9 ng/L (ref <18)

## 2023-02-20 LAB — RESP PANEL BY RT-PCR (RSV, FLU A&B, COVID)  RVPGX2
Influenza A by PCR: NEGATIVE
Influenza B by PCR: NEGATIVE
Resp Syncytial Virus by PCR: NEGATIVE
SARS Coronavirus 2 by RT PCR: NEGATIVE

## 2023-02-20 LAB — LACTIC ACID, PLASMA: Lactic Acid, Venous: 1.1 mmol/L (ref 0.5–1.9)

## 2023-02-20 LAB — MAGNESIUM: Magnesium: 2.2 mg/dL (ref 1.7–2.4)

## 2023-02-20 LAB — BRAIN NATRIURETIC PEPTIDE: B Natriuretic Peptide: 106 pg/mL — ABNORMAL HIGH (ref 0.0–100.0)

## 2023-02-20 MED ORDER — LEVOTHYROXINE SODIUM 112 MCG PO TABS
112.0000 ug | ORAL_TABLET | Freq: Every day | ORAL | Status: DC
  Administered 2023-02-21 – 2023-02-24 (×4): 112 ug via ORAL
  Filled 2023-02-20 (×4): qty 1

## 2023-02-20 MED ORDER — POLYETHYLENE GLYCOL 3350 17 G PO PACK: 17.0000 g | PACK | Freq: Every day | ORAL | Status: DC | PRN

## 2023-02-20 MED ORDER — VANCOMYCIN HCL 750 MG/150ML IV SOLN
750.0000 mg | INTRAVENOUS | Status: DC
  Administered 2023-02-21: 750 mg via INTRAVENOUS
  Filled 2023-02-20: qty 150

## 2023-02-20 MED ORDER — ESCITALOPRAM OXALATE 20 MG PO TABS
20.0000 mg | ORAL_TABLET | Freq: Every day | ORAL | Status: DC
  Administered 2023-02-21 – 2023-02-24 (×4): 20 mg via ORAL
  Filled 2023-02-20 (×4): qty 1

## 2023-02-20 MED ORDER — SODIUM CHLORIDE 0.9 % IV SOLN: INTRAVENOUS | Status: AC

## 2023-02-20 MED ORDER — VANCOMYCIN HCL 1500 MG/300ML IV SOLN
1500.0000 mg | Freq: Once | INTRAVENOUS | Status: AC
  Administered 2023-02-20: 1500 mg via INTRAVENOUS
  Filled 2023-02-20: qty 300

## 2023-02-20 MED ORDER — SODIUM CHLORIDE 0.9 % IV SOLN
2.0000 g | INTRAVENOUS | Status: DC
  Administered 2023-02-21: 2 g via INTRAVENOUS
  Filled 2023-02-20: qty 20

## 2023-02-20 MED ORDER — ACETAMINOPHEN 325 MG PO TABS
650.0000 mg | ORAL_TABLET | Freq: Four times a day (QID) | ORAL | Status: DC | PRN
  Administered 2023-02-21 – 2023-02-22 (×3): 650 mg via ORAL
  Filled 2023-02-20 (×3): qty 2

## 2023-02-20 MED ORDER — SODIUM CHLORIDE 0.9 % IV BOLUS (SEPSIS)
1000.0000 mL | Freq: Once | INTRAVENOUS | Status: AC
  Administered 2023-02-20: 1000 mL via INTRAVENOUS

## 2023-02-20 MED ORDER — ENOXAPARIN SODIUM 40 MG/0.4ML IJ SOSY
40.0000 mg | PREFILLED_SYRINGE | INTRAMUSCULAR | Status: DC
  Administered 2023-02-20 – 2023-02-23 (×4): 40 mg via SUBCUTANEOUS
  Filled 2023-02-20 (×4): qty 0.4

## 2023-02-20 MED ORDER — VITAMIN B-12 1000 MCG PO TABS
1000.0000 ug | ORAL_TABLET | Freq: Two times a day (BID) | ORAL | Status: DC
  Administered 2023-02-20 – 2023-02-24 (×8): 1000 ug via ORAL
  Filled 2023-02-20 (×7): qty 1

## 2023-02-20 MED ORDER — PANTOPRAZOLE SODIUM 40 MG PO TBEC
40.0000 mg | DELAYED_RELEASE_TABLET | Freq: Two times a day (BID) | ORAL | Status: DC
  Administered 2023-02-20 – 2023-02-23 (×7): 40 mg via ORAL
  Filled 2023-02-20 (×7): qty 1

## 2023-02-20 MED ORDER — ENSURE ENLIVE PO LIQD
237.0000 mL | Freq: Two times a day (BID) | ORAL | Status: DC
  Administered 2023-02-22: 237 mL via ORAL

## 2023-02-20 MED ORDER — SODIUM CHLORIDE 0.9 % IV SOLN: INTRAVENOUS | Status: DC

## 2023-02-20 MED ORDER — SODIUM CHLORIDE 0.9 % IV SOLN
2.0000 g | Freq: Once | INTRAVENOUS | Status: AC
  Administered 2023-02-20: 2 g via INTRAVENOUS
  Filled 2023-02-20: qty 20

## 2023-02-20 MED ORDER — VANCOMYCIN HCL 1500 MG/300ML IV SOLN: 1500.0000 mg | INTRAVENOUS | Status: DC

## 2023-02-20 MED ORDER — MELATONIN 5 MG PO TABS
5.0000 mg | ORAL_TABLET | Freq: Every evening | ORAL | Status: DC | PRN
  Administered 2023-02-22 – 2023-02-23 (×2): 5 mg via ORAL
  Filled 2023-02-20 (×2): qty 1

## 2023-02-20 MED ORDER — ATORVASTATIN CALCIUM 40 MG PO TABS
40.0000 mg | ORAL_TABLET | Freq: Every day | ORAL | Status: DC
  Administered 2023-02-20 – 2023-02-23 (×4): 40 mg via ORAL
  Filled 2023-02-20 (×4): qty 1

## 2023-02-20 MED ORDER — VANCOMYCIN HCL IN DEXTROSE 1-5 GM/200ML-% IV SOLN: 1000.0000 mg | Freq: Once | INTRAVENOUS | Status: DC

## 2023-02-20 MED ORDER — PROCHLORPERAZINE EDISYLATE 10 MG/2ML IJ SOLN: 5.0000 mg | Freq: Four times a day (QID) | INTRAMUSCULAR | Status: DC | PRN

## 2023-02-20 MED ORDER — ASPIRIN 81 MG PO TBEC
81.0000 mg | DELAYED_RELEASE_TABLET | Freq: Every day | ORAL | Status: DC
  Administered 2023-02-21 – 2023-02-24 (×4): 81 mg via ORAL
  Filled 2023-02-20 (×4): qty 1

## 2023-02-20 NOTE — Telephone Encounter (Signed)
Spoke with pt's daughter via telephone regarding pt's status.  Pt's daughter stated that the pt was d/c home and pt cannot walk, roll, nor stand.  Daughter stated that the pt is incontinent and requires total care.  Reviewed pt's chart and pt was recently d/c home from hospital d/t recent fall on 02/17/2023.  Pt's daughter stated the pt should have been admitted to a Rehab because they are unable to take care of the pt in his current state.  Asked daughter if a Case Manager Spoke with them while the pt was admitted to the hospital.  Both pt's daughters stated "NO".  This RN continued to review pt's chart notes from recent hospitalization.  Saw progress note from Education officer, community which states pt will get home PT with [facility].  Cherish's note also states the pt's case was accepted by [facility].  Cherish also noted that she spoke with the pt's daughter to inform daughter that [facility] will be contacting her w/in 24 to 48 hrs.  Asked pt's daughter did she speak with Madilyn Fireman regarding home PT w/[facility].  Pt's daughter said "Yes" I did get a call from someone saying [facility] was going to contact me.  Informed pt's daughter that that was the Child psychotherapist which is the pt's Sports coach.  Stated that if Frances Furbish does not contact the pt's daughter by Wednesday, to please give Dr. Latanya Maudlin office a call and we will ask Cherish to reach out to [facility] again.  Pt's daughter stated she will listen for [facility] call.  Pt's daughter stated the pt has stitches in his forehead, bruised on left side, and several skin tears/open wounds on his left side.  Daughter stated pt is c/o being cold but daughter was unable to take a temperature at time of call d/t pt does not have a thermometer.  Instructed pt's daughter to please purchase a thermometer and to check pt's temp when pt c/o of being cold w/shaking.  Stated if pt has a fever, please take pt to ER for further  evaluation since pt has open wounds.  Pt's daughter verbalized understanding.  Pt's daughter stated the pt is currently extremely fatigue, disoriented at times, incontinent, and extremely weak.  Daughter stated one of the wounds have brown "Puss" coming out of it and they are not sure should they be concerned.  Pt's daughter feels the pt should have been d/c to a Rehab instead of home d/t pt is now total care.  Pt's daughter stated since the pt missed his appts on 02/17/2023 when does Dr. Mosetta Putt want to see the pt back in clinic.  Informed pt's daughter that this RN will f/u with Dr. Mosetta Putt regarding the pt's f/u appt.  Notified Dr. Mosetta Putt and Madilyn Fireman regarding pt's daughter's call and concerns.

## 2023-02-20 NOTE — ED Triage Notes (Signed)
Patient BIB EMS from home c/o weakness. Family report unable to take care of the patient at home and hope they can get a rehab for him. Patient was recently DC at home yesterday. Patient a/ox4.

## 2023-02-20 NOTE — H&P (Incomplete)
History and Physical  Angel Keller FXT:[id number] DOB: 1941/03/25 DOA: 02/20/2023  Referring physician: Dr. Durwin Nora, EDP  PCP: Carlean Jews, NP  Outpatient Specialists: Medical oncology. Patient coming from: Home  Chief Complaint: Generalized weakness  HPI: Angel Keller. is a 82 y.o. 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  He returns today due to generalized weakness, family unable to care for him at home.  Per family, the patient has not been able to get up on his own or assist in his own shifting or transfer due to his generalized weakness.  In the ED, weak-appearing.  Endorses poor oral intake with loss of appetite.  Reports, has had purulent drainage from right forearm.  Due to concern for infected wound, peripheral blood cultures were obtained x 2 and the patient was started on empiric IV antibiotics.  Wound care specialist was consulted for local wound care.  TR[facility], hospitalist service, was asked to admit.  The patient was admitted to telemetry unit as inpatient status.  TOC and medical oncology are consulted to assist with the management.  ED Course: Temperature 98.2.  BP 150/78, pulse 73, respiratory 17, O2 saturation 99% on room air.  Lab studies remarkable for serum sodium 133, glucose 108, alkaline phosphatase 129, albumin 2.6, GFR greater than 60, BNP 106.  WBC 11.1 from 7.8, 2 days ago, hemoglobin 8.8 from 8.5, 2 days ago, platelet count 249.  Review of Systems: Review of systems as noted in the HPI. All other systems reviewed and are negative.   Past Medical History:  Diagnosis Date   Anxiety     Arthritis    Depression    Family history of breast cancer    Family history of lung cancer    Headache    HLD (hyperlipidemia)    Hx of inguinal hernia repair    Hypertension    Hypothyroidism    Nausea with vomiting 09/21/2021   Overactive bladder    Pancreatic cancer (HCC)    Pneumonia    Shingles    Sleep apnea    Patient denies   Thoracic ascending aortic aneurysm (HCC)    mildly dilated 4.0cm per 08/12/21 CT   Thyroid disease    Varicose veins of both lower extremities    Past Surgical History:  Procedure Laterality Date   BILATERAL CARPAL TUNNEL RELEASE     CATARACT EXTRACTION, BILATERAL Bilateral    ELBOW SURGERY Bilateral    ENDOSCOPIC RETROGRADE CHOLANGIOPANCREATOGRAPHY (ERCP) WITH PROPOFOL N/A 07/20/2021   Procedure: ENDOSCOPIC RETROGRADE CHOLANGIOPANCREATOGRAPHY (ERCP) WITH PROPOFOL;  Surgeon: Iva Boop, MD;  Location: [facility] ENDOSCOPY;  Service: Endoscopy;  Laterality: N/A;   ESOPHAGOGASTRODUODENOSCOPY (EGD) WITH PROPOFOL N/A 08/05/2021   Procedure: ESOPHAGOGASTRODUODENOSCOPY (EGD) WITH PROPOFOL;  Surgeon: Meridee Score Netty Starring., MD;  Location: [facility] ENDOSCOPY;  Service: Gastroenterology;  Laterality: N/A;   EUS N/A 08/05/2021   Procedure: UPPER ENDOSCOPIC ULTRASOUND (EUS) RADIAL;  Surgeon: Lemar Lofty., MD;  Location: [facility] ENDOSCOPY;  Service: Gastroenterology;  Laterality: N/A;   EYE SURGERY     bilateral cataracts   FINE NEEDLE ASPIRATION  08/05/2021   Procedure: FINE NEEDLE ASPIRATION (FNA) LINEAR;  Surgeon: Lemar Lofty., MD;  Location: [facility] ENDOSCOPY;  Service: Gastroenterology;;  INGUINAL HERNIA REPAIR Right 1951   IR ANGIO INTRA EXTRACRAN SEL COM CAROTID INNOMINATE BILAT MOD SED  02/20/2019   IR BILIARY STENT(S) EXISTING ACCESS INC DILATION CATH EXCHANGE  09/20/2021   IR ENDOLUMINAL BX OF BILIARY TREE  07/22/2021   IR GENERIC HISTORICAL  10/28/2016   IR RADIOLOGIST EVAL & MGMT 10/28/2016 [facility]-INTER[facility] RAD   IR INT EXT BILIARY DRAIN WITH  CHOLANGIOGRAM  07/22/2021   JOINT REPLACEMENT     LAPAROSCOPY N/A 11/15/2021   Procedure: LAPAROSCOPIC DIAGNOSTIC STAGING;  Surgeon: Fritzi Mandes, MD;  Location: [facility] OR;  Service: General;  Laterality: N/A;   PORTACATH PLACEMENT N/A 08/19/2021   Procedure: INSERTION PORT-A-CATH;  Surgeon: Fritzi Mandes, MD;  Location: [facility] ORS;  Service: General;  Laterality: N/A;  LMA   RADIOLOGY WITH ANESTHESIA N/A 10/21/2015   Procedure: RADIOLOGY WITH ANESTHESIA;  Surgeon: Julieanne Cotton, MD;  Location: [facility] OR;  Service: Radiology;  Laterality: N/A;   RADIOLOGY WITH ANESTHESIA N/A 02/20/2019   Procedure: Sharman Crate;  Surgeon: Julieanne Cotton, MD;  Location: [facility] OR;  Service: Radiology;  Laterality: N/A;   ROTATOR CUFF REPAIR Bilateral    VARICOSE VEIN SURGERY     WHIPPLE PROCEDURE N/A 11/15/2021   Procedure: WHIPPLE PROCEDURE;  Surgeon: Fritzi Mandes, MD;  Location: [facility] OR;  Service: General;  Laterality: N/A;    Social History:  reports that he quit smoking about 61 years ago. His smoking use included cigarettes. He has a 0.25 pack-year smoking history. He has never used smokeless tobacco. He reports that he does not drink alcohol and does not use drugs.   No Known Allergies  Family History  Problem Relation Age of Onset   Breast cancer Mother    Lung cancer Father    Alcoholism Sister    Cirrhosis Sister    Colon cancer Neg Hx    Esophageal cancer Neg Hx    Liver cancer Neg Hx    Pancreatic cancer Neg Hx    Prostate cancer Neg Hx       Prior to Admission medications   Medication Sig Start Date End Date Taking? Authorizing Provider  amLODipine (NORVASC) 5 MG tablet TAKE 1 TABLET (5 MG TOTAL) BY MOUTH DAILY. 11/17/22 05/16/23  Carlean Jews, NP  aspirin EC 81 MG tablet Take 1 tablet (81 mg total) by mouth daily. Swallow whole. 02/18/23 03/20/23  Calvert Cantor, MD  atorvastatin (LIPITOR) 40 MG tablet TAKE 1 TABLET (40 MG TOTAL) BY MOUTH AT BEDTIME. 02/02/23   Carlean Jews, NP   Cholecalciferol (VITAMIN D3) 50 MCG (2000 UT) TABS Take 2,000 Units by mouth daily.    [provider]  eltrombopag (PROMACTA) 50 MG tablet Take 1 tablet (50 mg total) by mouth daily. Take on an empty stomach 1 hour before a meal or 2 hours after 11/11/22   Malachy Mood, MD  escitalopram (LEXAPRO) 20 MG tablet Take 1 tablet (20 mg total) by mouth daily. 09/26/22   Carlean Jews, NP  feeding supplement (ENSURE ENLIVE / ENSURE PLUS) LIQD Take 237 mLs by mouth 2 (two) times daily between meals. 02/19/23   Calvert Cantor, MD  levothyroxine (SYNTHROID) 112 MCG tablet Take 1 tablet (112 mcg total) by mouth daily before breakfast. 09/26/22   Carlean Jews, NP  lidocaine-prilocaine (EMLA) cream Apply topically to affected area(s) as needed. Patient taking differently: Apply 1 Application topically as needed (port). 08/24/22   Ronny Bacon, PA-C  lipase/protease/amylase (CREON) 36000 UNITS CPEP capsule Take 1  capsule (36,000 Units total) by mouth 3 (three) times daily before meals. Patient taking differently: Take 72,000 Units by mouth 3 (three) times daily before meals. 08/24/22   Malachy Mood, MD  loperamide (IMODIUM) 2 MG capsule Take 1 capsule (2 mg total) by mouth 4 (four) times daily as needed for diarrhea or loose stools. 11/29/22   Rexford Maus, DO  megestrol (MEGACE ES) 625 MG/5ML suspension Take 5 mLs (625 mg total) by mouth daily. 01/02/23   Carlean Jews, NP  mirtazapine (REMERON) 7.5 MG tablet Take 1 tablet (7.5 mg total) by mouth at bedtime. 11/01/22   Carlean Jews, NP  pantoprazole (PROTONIX) 40 MG tablet Take 1 tablet (40 mg total) by mouth 2 (two) times daily. 01/02/23   Carlean Jews, NP  prochlorperazine (COMPAZINE) 10 MG tablet Take 1 tablet (10 mg total) by mouth every 6 (six) hours as needed (Nausea or vomiting). 06/09/22   Malachy Mood, MD  vitamin B-12 (CYANOCOBALAMIN) 1000 MCG tablet Take 1,000 mcg by mouth 2 (two) times daily.    [provider]     Physical Exam: BP 115/66   Pulse 71   Temp 97.8 F (36.6 C) (Oral)   Resp 19   Ht 6' (1.829 m)   Wt 60 kg   SpO2 98%   BMI 17.94 kg/m   General: 82 y.o. year-old male frail in no acute distress.  Alert and weak-appearing. Cardiovascular: Regular rate and rhythm with no rubs or gallops.  No thyromegaly or JVD noted.  No lower extremity edema. 2/4 pulses in all 4 extremities. Respiratory: Clear to auscultation with no wheezes or rales. Good inspiratory effort. Abdomen: Soft nontender nondistended with normal bowel sounds x4 quadrants. Muskuloskeletal: No cyanosis, clubbing or edema noted bilaterally Neuro: CN II-XII intact, strength, sensation, reflexes Skin:     Right forearm 02/20/2023.   Right forearm 02/20/2023.   Psychiatry: Mood is appropriate for condition and setting          Labs on Admission:  Basic Metabolic Panel: Recent Labs  Lab 02/17/23 1456 02/18/23 0500 02/20/23 1800  NA 130* 131* 133*  K 3.4* 3.3* 4.0  CL 101 101 102  CO2 21* 23 22  GLUCOSE 102* 93 108*  BUN 30* 18 19  CREATININE 1.16 0.86 1.14  CALCIUM 8.2* 8.1* 8.3*  MG  --  2.5* 2.2   Liver Function Tests: Recent Labs  Lab 02/17/23 1456 02/20/23 1800  AST 73* 40  ALT 53* 42  ALKPHOS 119 129*  BILITOT 0.3 0.3  PROT 7.2 6.9  ALBUMIN 2.9* 2.6*   No results for input(s): "LIPASE", "AMYLASE" in the last 168 hours. No results for input(s): "AMMONIA" in the last 168 hours. CBC: Recent Labs  Lab 02/17/23 1456 02/18/23 0500 02/20/23 1800  WBC 7.7 7.8 11.1*  NEUTROABS  --   --  8.9*  HGB 7.7* 8.5* 8.8*  HCT 23.8* 26.1* 27.3*  MCV 80.7 80.8 81.5  PLT 209 233 249   Cardiac Enzymes: No results for input(s): "CKTOTAL", "CKMB", "CKMBINDEX", "TROPONINI" in the last 168 hours.  BNP (last 3 results) Recent Labs    02/20/23 1800  BNP 106.0*    ProBNP (last 3 results) No results for input(s): "PROBNP" in the last 8760 hours.  CBG: No results for input(s): "GLUCAP" in the last  168 hours.  Radiological Exams on Admission: DG Chest Port 1 View  Result Date: 02/20/2023 CLINICAL DATA:  Sepsis. EXAM: PORTABLE CHEST 1 VIEW COMPARISON:  August 02, 2022. FINDINGS: The heart size and mediastinal contours are within normal limits. Both lungs are clear. Right internal jugular Port-A-Cath is unchanged. The visualized skeletal structures are unremarkable. IMPRESSION: No active disease. Electronically Signed   By: Lupita Raider M.D.   On: 02/20/2023 17:59    EKG: I independently viewed the EKG done and my findings are as followed: Sinus rhythm rate of 69.  Nonspecific ST-T changes.  QTc 491.  Assessment/Plan Present on Admission: **None**  Principal Problem:   Generalized weakness  Generalized weakness likely multifactorial secondary to dehydration, malnutrition from poor oral intake, physical debility. Treat underlying conditions. PT OT assessment Dietitian assessment Fall precautions Encourage increase in oral protein calorie intake as tolerated. TOC consulted to assist with disposition.  Dehydration, from poor oral intake Failure to thrive in an adult Severe protein calorie malnutrition BMI 16, severe muscle mass loss Continue gentle IV fluid hydration NS at 50 cc/h x 1 day Encourage oral protein calorie intake as tolerated The patient is on Megetrol for appetite stimulant Dietitian consulted Feeding assistance with each meal Liberalize his diet to facilitate oral calorie intake  Right forearm wound with purulence, POA Wound care specialist consulted Follow peripheral blood cultures x 2 Continue broad-spectrum IV antibiotics, IV vancomycin, Rocephin. Obtain MRSA screening test.  QTc prolongation Avoid QTc prolonging agents Optimize magnesium and potassium levels Monitor on telemetry  Hypothyroidism Resume home levothyroxine  Chronic anxiety/depression Resume home Lexapro  Pancreatic cancer Myelodysplastic syndrome on [facility] Medical oncology  consulted Appreciate Dr. Latanya Maudlin assistance.  GERD Resume home PPI  Hyperlipidemia Resume home Lipitor.   Time: 75 minutes.   DVT prophylaxis: Subcu Lovenox daily  Code Status: Full code.  Family Communication: Updated the patient's daughter and son at bedside.  Disposition Plan: Admitted to telemetry unit  Consults called: Hematology oncology, dietitian, PT OT.  Admission status: Inpatient status.   Status is: Inpatient The patient requires at least 2 midnights for further evaluation and treatment of present condition.   Darlin Drop MD [facility] Pager [phone]  If 7PM-7AM, please contact night-coverage [email or web address] Password [facility]  02/20/2023, 8:49 PM

## 2023-02-20 NOTE — Progress Notes (Signed)
PHARMACY -  BRIEF ANTIBIOTIC NOTE   Pharmacy has received consult(s) for vancomycin from an ED provider.  The patient's profile has been reviewed for ht/wt/allergies/indication/available labs.    One time order(s) placed for vancomycin 1500 mg  Further antibiotics/pharmacy consults should be ordered by admitting physician if indicated.                       Thank you,  Pricilla Riffle, PharmD, BCPS Clinical Pharmacist 02/20/2023 5:30 PM

## 2023-02-20 NOTE — Telephone Encounter (Signed)
Patients son called in stating that the patient fell on Friday went to ER and was admitted to the for weakness and 4 stitches to his forehead from fall. He was discharged on Sunday home. Patient is very weak and unable to get out of bed even with assistance from his son. Son was asking why they didn't send him to a rehab for therapy I told him I was not sure why but to be admitted into a rehab he would have to be in the hospital to have that referral done. Son stated that he would be calling the ambulance to come transport him back to the ER so he can be seen and get a referral for physical therapy in a rehab. Told the son to contact if we could do anything to help.

## 2023-02-20 NOTE — ED Provider Notes (Signed)
[place] EMERGENCY DEPARTMENT AT [facility] Provider Note   CSN: [id number] Arrival date & time: 02/20/23  1651     History  Chief Complaint  Patient presents with   Weakness    Angel Keller. is a 82 y.o. male.   Weakness Patient presents for generalized weakness.  Medical history includes anxiety, arthritis, depression, HLD, HTN, hypothyroidism, pancreatic cancer, MDS, sleep apnea.  He was admitted to the hospital 3 days ago for dehydration.  He was discharged yesterday.  Family reports that he is very weak and unable to get out of bed, even with assistance.  Family is concerned of open wounds.  Patient endorses soreness in his back from laying around.  He states that he has had poor p.o. intake due to loss of appetite.  He has had purulent drainage from right forearm wound.     Home Medications Prior to Admission medications   Medication Sig Start Date End Date Taking? Authorizing Provider  amLODipine (NORVASC) 5 MG tablet TAKE 1 TABLET (5 MG TOTAL) BY MOUTH DAILY. 11/17/22 05/16/23  Carlean Jews, NP  aspirin EC 81 MG tablet Take 1 tablet (81 mg total) by mouth daily. Swallow whole. 02/18/23 03/20/23  Calvert Cantor, MD  atorvastatin (LIPITOR) 40 MG tablet TAKE 1 TABLET (40 MG TOTAL) BY MOUTH AT BEDTIME. 02/02/23   Carlean Jews, NP  Cholecalciferol (VITAMIN D3) 50 MCG (2000 UT) TABS Take 2,000 Units by mouth daily.    [provider]  eltrombopag (PROMACTA) 50 MG tablet Take 1 tablet (50 mg total) by mouth daily. Take on an empty stomach 1 hour before a meal or 2 hours after 11/11/22   Malachy Mood, MD  escitalopram (LEXAPRO) 20 MG tablet Take 1 tablet (20 mg total) by mouth daily. 09/26/22   Carlean Jews, NP  feeding supplement (ENSURE ENLIVE / ENSURE PLUS) LIQD Take 237 mLs by mouth 2 (two) times daily between meals. 02/19/23   Calvert Cantor, MD  levothyroxine (SYNTHROID) 112 MCG tablet Take 1 tablet (112 mcg total) by mouth daily before breakfast.  09/26/22   Carlean Jews, NP  lidocaine-prilocaine (EMLA) cream Apply topically to affected area(s) as needed. Patient taking differently: Apply 1 Application topically as needed (port). 08/24/22   Ronny Bacon, PA-C  lipase/protease/amylase (CREON) 36000 UNITS CPEP capsule Take 1 capsule (36,000 Units total) by mouth 3 (three) times daily before meals. Patient taking differently: Take 72,000 Units by mouth 3 (three) times daily before meals. 08/24/22   Malachy Mood, MD  loperamide (IMODIUM) 2 MG capsule Take 1 capsule (2 mg total) by mouth 4 (four) times daily as needed for diarrhea or loose stools. 11/29/22   Rexford Maus, DO  megestrol (MEGACE ES) 625 MG/5ML suspension Take 5 mLs (625 mg total) by mouth daily. 01/02/23   Carlean Jews, NP  mirtazapine (REMERON) 7.5 MG tablet Take 1 tablet (7.5 mg total) by mouth at bedtime. 11/01/22   Carlean Jews, NP  pantoprazole (PROTONIX) 40 MG tablet Take 1 tablet (40 mg total) by mouth 2 (two) times daily. 01/02/23   Carlean Jews, NP  prochlorperazine (COMPAZINE) 10 MG tablet Take 1 tablet (10 mg total) by mouth every 6 (six) hours as needed (Nausea or vomiting). 06/09/22   Malachy Mood, MD  vitamin B-12 (CYANOCOBALAMIN) 1000 MCG tablet Take 1,000 mcg by mouth 2 (two) times daily.    [provider]      Allergies    Patient has  no known allergies.    Review of Systems   Review of Systems  Constitutional:  Positive for activity change, appetite change and fatigue.  Musculoskeletal:  Positive for back pain.  Skin:  Positive for wound.  Neurological:  Positive for weakness (Generalized).  All other systems reviewed and are negative.   Physical Exam Updated Vital Signs BP 126/70   Pulse 73   Temp 98.2 F (36.8 C) (Oral)   Resp (!) 21   Ht 6' (1.829 m)   Wt 60 kg   SpO2 100%   BMI 17.94 kg/m  Physical Exam Vitals and nursing note reviewed.  Constitutional:      General: He is not in acute distress.     Appearance: Normal appearance. He is well-developed. He is not ill-appearing, toxic-appearing or diaphoretic.  HENT:     Head: Normocephalic and atraumatic.     Right Ear: External ear normal.     Left Ear: External ear normal.     Nose: Nose normal.     Mouth/Throat:     Mouth: Mucous membranes are moist.  Eyes:     Extraocular Movements: Extraocular movements intact.     Conjunctiva/sclera: Conjunctivae normal.  Cardiovascular:     Rate and Rhythm: Normal rate and regular rhythm.     Heart sounds: No murmur heard. Pulmonary:     Effort: Pulmonary effort is normal. No respiratory distress.     Breath sounds: Normal breath sounds. No wheezing or rales.  Chest:     Chest wall: No tenderness.  Abdominal:     General: There is no distension.     Palpations: Abdomen is soft.     Tenderness: There is no abdominal tenderness.  Musculoskeletal:        General: No swelling or deformity. Normal range of motion.     Cervical back: Normal range of motion and neck supple.     Right lower leg: No edema.     Left lower leg: No edema.  Skin:    General: Skin is warm and dry.     Capillary Refill: Capillary refill takes less than 2 seconds.     Coloration: Skin is not jaundiced or pale.     Comments: Skin tears to bilateral upper arms, scabbed wound with purulent drainage on right forearm, well-healing laceration to right eyebrow area.  Neurological:     General: No focal deficit present.     Mental Status: He is alert and oriented to person, place, and time.  Psychiatric:        Mood and Affect: Mood normal.        Behavior: Behavior normal.        Thought Content: Thought content normal.        Judgment: Judgment normal.        ED Results / Procedures / Treatments   Labs (all labs ordered are listed, but only abnormal results are displayed) Labs Reviewed  COMPREHENSIVE METABOLIC PANEL - Abnormal; Notable for the following components:      Result Value   Sodium 133 (*)     Glucose, Bld 108 (*)    Calcium 8.3 (*)    Albumin 2.6 (*)    Alkaline Phosphatase 129 (*)    All other components within normal limits  CBC WITH DIFFERENTIAL/PLATELET - Abnormal; Notable for the following components:   WBC 11.1 (*)    RBC 3.35 (*)    Hemoglobin 8.8 (*)    HCT 27.3 (*)  RDW 15.6 (*)    Neutro Abs 8.9 (*)    Abs Immature Granulocytes 0.14 (*)    All other components within normal limits  BRAIN NATRIURETIC PEPTIDE - Abnormal; Notable for the following components:   B Natriuretic Peptide 106.0 (*)    All other components within normal limits  URINALYSIS, W/ REFLEX TO CULTURE (INFECTION SUSPECTED) - Abnormal; Notable for the following components:   Hgb urine dipstick SMALL (*)    All other components within normal limits  RESP PANEL BY RT-PCR (RSV, FLU A&B, COVID)  RVPGX2  CULTURE, BLOOD (ROUTINE X 2)  CULTURE, BLOOD (ROUTINE X 2)  MRSA NEXT GEN BY PCR, NASAL  LACTIC ACID, PLASMA  MAGNESIUM  CBC WITH DIFFERENTIAL/PLATELET  COMPREHENSIVE METABOLIC PANEL  MAGNESIUM  PHOSPHORUS  TROPONIN I (HIGH SENSITIVITY)    EKG None  Radiology DG Chest Port 1 View  Result Date: 02/20/2023 CLINICAL DATA:  Sepsis. EXAM: PORTABLE CHEST 1 VIEW COMPARISON:  August 02, 2022. FINDINGS: The heart size and mediastinal contours are within normal limits. Both lungs are clear. Right internal jugular Port-A-Cath is unchanged. The visualized skeletal structures are unremarkable. IMPRESSION: No active disease. Electronically Signed   By: Lupita Raider M.D.   On: 02/20/2023 17:59    Procedures Procedures    Medications Ordered in ED Medications  enoxaparin (LOVENOX) injection 40 mg (40 mg Subcutaneous Given 02/20/23 2214)  cefTRIAXone (ROCEPHIN) 2 g in sodium chloride 0.9 % 100 mL IVPB (has no administration in time range)  aspirin EC tablet 81 mg (has no administration in time range)  atorvastatin (LIPITOR) tablet 40 mg (40 mg Oral Given 02/20/23 2214)  escitalopram (LEXAPRO) tablet  20 mg (has no administration in time range)  feeding supplement (ENSURE ENLIVE / ENSURE PLUS) liquid 237 mL (has no administration in time range)  levothyroxine (SYNTHROID) tablet 112 mcg (has no administration in time range)  pantoprazole (PROTONIX) EC tablet 40 mg (40 mg Oral Given 02/20/23 2213)  cyanocobalamin (VITAMIN B12) tablet 1,000 mcg (1,000 mcg Oral Given 02/20/23 2214)  acetaminophen (TYLENOL) tablet 650 mg (has no administration in time range)  prochlorperazine (COMPAZINE) injection 5 mg (has no administration in time range)  polyethylene glycol (MIRALAX / GLYCOLAX) packet 17 g (has no administration in time range)  melatonin tablet 5 mg (has no administration in time range)  vancomycin (VANCOREADY) IVPB 750 mg/150 mL (has no administration in time range)  0.9 %  sodium chloride infusion ( Intravenous Rate/Dose Change 02/20/23 2201)  sodium chloride 0.9 % bolus 1,000 mL (0 mLs Intravenous Stopped 02/20/23 2048)  cefTRIAXone (ROCEPHIN) 2 g in sodium chloride 0.9 % 100 mL IVPB (0 g Intravenous Stopped 02/20/23 1839)  vancomycin (VANCOREADY) IVPB 1500 mg/300 mL (0 mg Intravenous Stopped 02/20/23 2047)    ED Course/ Medical Decision Making/ A&P                             Medical Decision Making Amount and/or Complexity of Data Reviewed Labs: ordered. Radiology: ordered. ECG/medicine tests: ordered.  Risk Prescription drug management. Decision regarding hospitalization.   This patient presents to the ED for concern of generalized weakness, this involves an extensive number of treatment options, and is a complaint that carries with it a high risk of complications and morbidity.  The differential diagnosis includes infection, dehydration, anemia, metabolic derangements, deconditioning, neoplasm, depression   Co morbidities that complicate the patient evaluation  anxiety, arthritis, depression, HLD, HTN, hypothyroidism, pancreatic cancer,  MDS, sleep apnea   Additional history  obtained:  Additional history obtained from N/A External records from outside source obtained and reviewed including EMR   Lab Tests:  I Ordered, and personally interpreted labs.  The pertinent results include: Leukocytosis is present.  Anemia is baseline.  Troponin is normal.   Imaging Studies ordered:  I ordered imaging studies including chest x-ray I independently visualized and interpreted imaging which showed no acute findings I agree with the radiologist interpretation   Cardiac Monitoring: / EKG:  The patient was maintained on a cardiac monitor.  I personally viewed and interpreted the cardiac monitored which showed an underlying rhythm of: Sinus rhythm  Problem List / ED Course / Critical interventions / Medication management  Patient presenting for generalized weakness.  On exam, patient is alert and oriented.  Vital signs are normal.  He endorses some soreness in his back which he attributes to recent immobility.  He has skin tears to bilateral arms.  Notably, he has an area of erythema, scabbing, and purulent drainage to his right forearm.  Patient was treated for purulent cellulitis with ceftriaxone and vancomycin.  IV fluids were ordered for hydration.  Lab work is notable for leukocytosis.  Patient was admitted to medicine for further management. I ordered medication including IV fluids for hydration; vancomycin and ceftriaxone for purulent cellulitis Reevaluation of the patient after these medicines showed that the patient improved I have reviewed the patients home medicines and have made adjustments as needed   Social Determinants of Health:  Lives at home with family         Final Clinical Impression(s) / ED Diagnoses Final diagnoses:  Cellulitis of right upper extremity    Rx / DC Orders ED Discharge Orders     None         Gloris Manchester, MD 02/20/23 2346

## 2023-02-20 NOTE — Progress Notes (Signed)
Pharmacy Antibiotic Note  Collyn Stalvey. is a 82 y.o. male admitted on 02/20/2023 with cellulitis.  Pharmacy has been consulted for vanc dosing.  Plan: Vanc 1500mg  x 1 then 750mg  IV q24 - goal AUC 400-550  Height: 6' (182.9 cm) Weight: 60 kg (132 lb 4.4 oz) IBW/kg (Calculated) : 77.6  Temp (24hrs), Avg:97.8 F (36.6 C), Min:97.8 F (36.6 C), Max:97.8 F (36.6 C)  Recent Labs  Lab 02/17/23 1456 02/18/23 0500 02/20/23 1800  WBC 7.7 7.8 11.1*  CREATININE 1.16 0.86 1.14  LATICACIDVEN  --   --  1.1    Estimated Creatinine Clearance: 41.7 mL/min (by C-G formula based on SCr of 1.14 mg/dL).    No Known Allergies    Thank you for allowing pharmacy to be a part of this patient's care.  Berkley Harvey 02/20/2023 8:52 PM

## 2023-02-21 DIAGNOSIS — L03113 Cellulitis of right upper limb: Secondary | ICD-10-CM | POA: Diagnosis not present

## 2023-02-21 DIAGNOSIS — R627 Adult failure to thrive: Secondary | ICD-10-CM | POA: Diagnosis not present

## 2023-02-21 DIAGNOSIS — C259 Malignant neoplasm of pancreas, unspecified: Secondary | ICD-10-CM | POA: Diagnosis not present

## 2023-02-21 LAB — CBC WITH DIFFERENTIAL/PLATELET
Abs Immature Granulocytes: 0.06 10*3/uL (ref 0.00–0.07)
Basophils Absolute: 0 10*3/uL (ref 0.0–0.1)
Basophils Relative: 0 %
Eosinophils Absolute: 0.1 10*3/uL (ref 0.0–0.5)
Eosinophils Relative: 1 %
HCT: 25.7 % — ABNORMAL LOW (ref 39.0–52.0)
Hemoglobin: 8.3 g/dL — ABNORMAL LOW (ref 13.0–17.0)
Immature Granulocytes: 1 %
Lymphocytes Relative: 14 %
Lymphs Abs: 1.3 10*3/uL (ref 0.7–4.0)
MCH: 26.3 pg (ref 26.0–34.0)
MCHC: 32.3 g/dL (ref 30.0–36.0)
MCV: 81.6 fL (ref 80.0–100.0)
Monocytes Absolute: 0.6 10*3/uL (ref 0.1–1.0)
Monocytes Relative: 6 %
Neutro Abs: 7.6 10*3/uL (ref 1.7–7.7)
Neutrophils Relative %: 78 %
Platelets: 276 10*3/uL (ref 150–400)
RBC: 3.15 MIL/uL — ABNORMAL LOW (ref 4.22–5.81)
RDW: 15.8 % — ABNORMAL HIGH (ref 11.5–15.5)
WBC: 9.6 10*3/uL (ref 4.0–10.5)
nRBC: 0 % (ref 0.0–0.2)

## 2023-02-21 LAB — COMPREHENSIVE METABOLIC PANEL
ALT: 39 U/L (ref 0–44)
AST: 38 U/L (ref 15–41)
Albumin: 2.4 g/dL — ABNORMAL LOW (ref 3.5–5.0)
Alkaline Phosphatase: 123 U/L (ref 38–126)
Anion gap: 11 (ref 5–15)
BUN: 20 mg/dL (ref 8–23)
CO2: 20 mmol/L — ABNORMAL LOW (ref 22–32)
Calcium: 8.2 mg/dL — ABNORMAL LOW (ref 8.9–10.3)
Chloride: 104 mmol/L (ref 98–111)
Creatinine, Ser: 1.01 mg/dL (ref 0.61–1.24)
GFR, Estimated: >60 mL/min (ref >60)
Glucose, Bld: 151 mg/dL — ABNORMAL HIGH (ref 70–99)
Potassium: 3.6 mmol/L (ref 3.5–5.1)
Sodium: 135 mmol/L (ref 135–145)
Total Bilirubin: 0.3 mg/dL (ref 0.3–1.2)
Total Protein: 6.5 g/dL (ref 6.5–8.1)

## 2023-02-21 LAB — PHOSPHORUS: Phosphorus: 3.9 mg/dL (ref 2.5–4.6)

## 2023-02-21 LAB — MAGNESIUM: Magnesium: 2.3 mg/dL (ref 1.7–2.4)

## 2023-02-21 NOTE — Evaluation (Signed)
Physical Therapy Evaluation Patient Details Name: Angel CUDE Sr. MRN: [id number] DOB: August 18, 1940 Today's Date: 02/21/2023  History of Present Illness  Patient is a 82 year old male who presented on 7/8 with poor oral intake, and drainage from right forearm.Patient was admitted with dehydration, failure to thrive, severe protein calorie malnutrition and right forearm wound with purulence.  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  Clinical Impression  Pt admitted with above diagnosis.  Patient was living at home with daughter with recent d/c from hospital. Patient currently has a LE weakness, strong posterior lean and instability with STS transfers requiring mod assist to stand from EOB, min-mod assist with bed mobility. Pt will benefit from post acute rehab. Will follow acutely  Pt currently with functional limitations due to the deficits listed below (see PT Problem List). Pt will benefit from acute skilled PT to increase their independence and safety with mobility to allow discharge.           Assistance Recommended at Discharge Intermittent Supervision/Assistance  If plan is discharge home, recommend the following:  Can travel by private vehicle  A lot of help with walking and/or transfers;Assistance with cooking/housework;Assist for transportation;Help with stairs or ramp for entrance   No    Equipment Recommendations None recommended by PT  Recommendations for Other Services       Functional Status Assessment Patient has had a recent decline in their functional status and demonstrates the ability to make significant improvements in function in a reasonable and predictable amount of time.     Precautions / Restrictions  Precautions Precautions: Fall Precaution Comments: R shoulder and upper arm with skin tears from fall Restrictions Weight Bearing Restrictions: No      Mobility  Bed Mobility Overal bed mobility: Needs Assistance Bed Mobility: Supine to Sit, Sit to Supine     Supine to sit: Min assist, Mod assist Sit to supine: Min assist   General bed mobility comments: assist to lifttrunk and progress LEs over EOB,   bed pad used to assist scooting to EOB; min assist to lift LEs on to bed and return to supine    Transfers Overall transfer level: Needs assistance Equipment used: Rolling walker (2 wheels) Transfers: Sit to/from Stand Sit to Stand: Mod assist           General transfer comment: 2 attempts to come to full stand; cues for hand and foot position; assist to rise and steady, posterior bias with difficulty maintaining knee flexion for anterior-superior wt translation    Ambulation/Gait               General Gait Details: lateral steps along EOB with RW and min assist to balance  Stairs            Wheelchair Mobility     Tilt Bed    Modified Rankin (Stroke Patients Only)       Balance Overall balance assessment: History of Falls, Needs assistance Sitting-balance support: Bilateral upper extremity supported, Feet supported Sitting balance-Leahy Scale: Fair Sitting balance - Comments: posterior lean initially, able to correct with cues and light assist   Standing balance support: Reliant on assistive device for balance, Bilateral upper extremity supported Standing balance-Leahy Scale: Poor Standing balance comment: posterior leaning needing physical A to maintain balance.  Pertinent Vitals/Pain Pain Assessment Pain Assessment: Faces Faces Pain Scale: Hurts even more Pain Location: right shoulder Pain Descriptors / Indicators: Sore, Guarding Pain Intervention(s): Limited activity within patient's tolerance,  Monitored during session, Repositioned    Home Living Family/patient expects to be discharged to:: Private residence Living Arrangements: Children;Other relatives Available Help at Discharge: Family;Available 24 hours/day (dtr) Type of Home: House Home Access: Stairs to enter Entrance Stairs-Rails: Lawyer of Steps: 3   Home Layout: One level Home Equipment: Agricultural consultant (2 wheels)      Prior Function Prior Level of Function : Needs assist             Mobility Comments: independent with ambulation; ADLs Comments: daughter drives him and does IADLs ; Ind with ADLs     Hand Dominance   Dominant Hand: Right    Extremity/Trunk Assessment   Upper Extremity Assessment Upper Extremity Assessment: Defer to OT evaluation RUE Deficits / Details: reported pain in shoulder, ableto AROM shoulder to aobut 90 degrees and touch top of head bilaterally. patient reported having fallen on this shoulder. noted to have dressings over glenohumeral joint, olecranon process, and proximal forearm.    Lower Extremity Assessment Lower Extremity Assessment: Generalized weakness    Cervical / Trunk Assessment Cervical / Trunk Assessment: Normal  Communication   Communication: HOH  Cognition Arousal/Alertness: Awake/alert Behavior During Therapy: WFL for tasks assessed/performed Overall Cognitive Status: Within Functional Limits for tasks assessed                                          General Comments      Exercises     Assessment/Plan    PT Assessment Patient needs continued PT services  PT Problem List Decreased strength;Decreased activity tolerance;Decreased balance;Decreased mobility;Decreased knowledge of use of DME;Pain;Decreased skin integrity       PT Treatment Interventions DME instruction;Gait training;Balance training;Therapeutic exercise;Functional mobility training;Therapeutic activities;Patient/family education    PT  Goals (Current goals can be found in the Care Plan section)  Acute Rehab PT Goals PT Goal Formulation: With patient Time For Goal Achievement: 03/07/23 Potential to Achieve Goals: Good    Frequency Min 1X/week     Co-evaluation               AM-PAC PT "6 Clicks" Mobility  Outcome Measure Help needed turning from your back to your side while in a flat bed without using bedrails?: A Little Help needed moving from lying on your back to sitting on the side of a flat bed without using bedrails?: A Lot Help needed moving to and from a bed to a chair (including a wheelchair)?: A Lot Help needed standing up from a chair using your arms (e.g., wheelchair or bedside chair)?: A Lot Help needed to walk in hospital room?: A Lot Help needed climbing 3-5 steps with a railing? : Total 6 Click Score: 12    End of Session   Activity Tolerance: Patient limited by fatigue Patient left: in bed;with bed alarm set;with call bell/phone within reach   PT Visit Diagnosis: Difficulty in walking, not elsewhere classified (R26.2);Muscle weakness (generalized) (M62.81);Unsteadiness on feet (R26.81)    Time: 4132-4401 PT Time Calculation (min) (ACUTE ONLY): 23 min   Charges:   PT Evaluation $PT Eval Low Complexity: 1 Low PT Treatments $Therapeutic Activity: 8-22 mins PT General Charges $$ ACUTE PT VISIT: 1 Visit  Delice Bison, PT  Acute Rehab Dept [facility]) [phone]  02/21/2023   Drucilla Chalet 02/21/2023, 4:15 PM

## 2023-02-21 NOTE — ED Notes (Signed)
ED TO INPATIENT HANDOFF REPORT  Name/Age/Gender Angel Frederic Sr. 82 y.o. male  Code Status    Code Status Orders  (From admission, onward)           Start     Ordered   02/20/23 2045  Full code  Continuous       Question:  By:  Answer:  Consent: discussion documented in EHR   02/20/23 2044           Code Status History     Date Active Date Inactive Code Status Order ID Comments User Context   02/17/2023 2001 02/19/2023 2024 Full Code [id number]  Alan Mulder, MD ED   09/16/2022 1510 09/17/2022 2306 Full Code [id number]  Jonah Blue, MD ED   07/28/2022 1206 07/29/2022 0510 Full Code [id number]  Berdine Dance, MD HOV   11/15/2021 1533 11/22/2021 1746 Full Code [id number]  Fritzi Mandes, MD Inpatient   08/28/2021 0410 09/01/2021 0011 Full Code [id number]  Chotiner, Claudean Severance, MD ED   07/18/2021 1953 07/23/2021 2225 Full Code [id number]  Synetta Fail, MD ED   07/27/2020 1942 07/31/2020 1800 Full Code [id number]  Arnetha Courser, MD ED   10/21/2015 1501 10/22/2015 1846 Full Code [id number]  Julieanne Cotton, MD Inpatient       Home/SNF/Other Home  Chief Complaint Generalized weakness [R53.1]  Level of Care/Admitting Diagnosis ED Disposition     ED Disposition  Admit   Condition  --   Comment  Hospital Area: [facility] [[id number]]  Level of Care: Telemetry [5]  Admit to tele based on following criteria: Monitor for Ischemic changes  May admit patient to [facility] or [facility] if equivalent level of care is available:: No  Covid Evaluation: Asymptomatic - no recent exposure (last 10 days) testing not required  Diagnosis: Generalized weakness [329518]  Admitting Physician: Darlin Drop [[id number]]  Attending Physician: Darlin Drop [[id number]]  Certification:: I certify this patient will need inpatient services for at least 2 midnights  Estimated Length of Stay: 2          Medical History Past Medical History:  Diagnosis Date   Anxiety     Arthritis    Depression    Family history of breast cancer    Family history of lung cancer    Headache    HLD (hyperlipidemia)    Hx of inguinal hernia repair    Hypertension    Hypothyroidism    Nausea with vomiting 09/21/2021   Overactive bladder    Pancreatic cancer (HCC)    Pneumonia    Shingles    Sleep apnea    Patient denies   Thoracic ascending aortic aneurysm (HCC)    mildly dilated 4.0cm per 08/12/21 CT   Thyroid disease    Varicose veins of both lower extremities     Allergies No Known Allergies  IV Location/Drains/Wounds Patient Lines/Drains/Airways Status     Active Line/Drains/Airways     Name Placement date Placement time Site Days   Implanted Port 10/06/22 Right Chest 10/06/22  1538  Chest  138   Peripheral IV 02/20/23 20 G Anterior;Left Forearm 02/20/23  1751  Forearm  1   Wound / Incision (Open or Dehisced) 02/17/23 Laceration Face Right;Upper sutured 02/17/23  2235  Face  4   Wound / Incision (Open or Dehisced) 02/17/23 Skin tear;Laceration Arm Anterior;Distal;Right;Upper 02/17/23  2236  Arm  4            Labs/Imaging Results for  orders placed or performed during the hospital encounter of 02/20/23 (from the past 48 hour(s))  Resp panel by RT-PCR (RSV, Flu A&B, Covid) Anterior Nasal Swab     Status: None   Collection Time: 02/20/23  6:00 PM   Specimen: Anterior Nasal Swab  Result Value Ref Range   SARS Coronavirus 2 by RT PCR NEGATIVE NEGATIVE    Comment: (NOTE) SARS-CoV-2 target nucleic acids are NOT DETECTED.  The SARS-CoV-2 RNA is generally detectable in upper respiratory specimens during the acute phase of infection. The lowest concentration of SARS-CoV-2 viral copies this assay can detect is 138 copies/mL. A negative result does not preclude SARS-Cov-2 infection and should not be used as the sole basis for treatment or other patient management decisions. A negative result may occur with  improper specimen collection/handling,  submission of specimen other than nasopharyngeal swab, presence of viral mutation(s) within the areas targeted by this assay, and inadequate number of viral copies(<138 copies/mL). A negative result must be combined with clinical observations, patient history, and epidemiological information. The expected result is Negative.  Fact Sheet for Patients:  BloggerCourse.com  Fact Sheet for Healthcare Providers:  SeriousBroker.it  This test is no t yet approved or cleared by the [place] FDA and  has been authorized for detection and/or diagnosis of SARS-CoV-2 by FDA under an Emergency Use Authorization (EUA). This EUA will remain  in effect (meaning this test can be used) for the duration of the COVID-19 declaration under Section 564(b)(1) of the Act, 21 U.S.C.section 360bbb-3(b)(1), unless the authorization is terminated  or revoked sooner.       Influenza A by PCR NEGATIVE NEGATIVE   Influenza B by PCR NEGATIVE NEGATIVE    Comment: (NOTE) The Xpert Xpress SARS-CoV-2/FLU/RSV plus assay is intended as an aid in the diagnosis of influenza from Nasopharyngeal swab specimens and should not be used as a sole basis for treatment. Nasal washings and aspirates are unacceptable for Xpert Xpress SARS-CoV-2/FLU/RSV testing.  Fact Sheet for Patients: BloggerCourse.com  Fact Sheet for Healthcare Providers: SeriousBroker.it  This test is not yet approved or cleared by the [place] FDA and has been authorized for detection and/or diagnosis of SARS-CoV-2 by FDA under an Emergency Use Authorization (EUA). This EUA will remain in effect (meaning this test can be used) for the duration of the COVID-19 declaration under Section 564(b)(1) of the Act, 21 U.S.C. section 360bbb-3(b)(1), unless the authorization is terminated or revoked.     Resp Syncytial Virus by PCR NEGATIVE NEGATIVE     Comment: (NOTE) Fact Sheet for Patients: BloggerCourse.com  Fact Sheet for Healthcare Providers: SeriousBroker.it  This test is not yet approved or cleared by the [place] FDA and has been authorized for detection and/or diagnosis of SARS-CoV-2 by FDA under an Emergency Use Authorization (EUA). This EUA will remain in effect (meaning this test can be used) for the duration of the COVID-19 declaration under Section 564(b)(1) of the Act, 21 U.S.C. section 360bbb-3(b)(1), unless the authorization is terminated or revoked.  Performed at [facility], [address], [place], [place] [address]   Lactic acid, plasma     Status: None   Collection Time: 02/20/23  6:00 PM  Result Value Ref Range   Lactic Acid, Venous 1.1 0.5 - 1.9 mmol/L    Comment: Performed at [facility], [address] [place], [place] [address]  Comprehensive metabolic panel     Status: Abnormal   Collection Time: 02/20/23  6:00 PM  Result Value Ref Range   Sodium 133 (L) 135 - 145 mmol/L   Potassium 4.0 3.5 - 5.1 mmol/L   Chloride 102 98 - 111 mmol/L   CO2 22 22 - 32 mmol/L   Glucose, Bld 108 (H) 70 - 99 mg/dL    Comment: Glucose reference range applies only to samples taken after fasting for at least 8 hours.   BUN 19 8 - 23 mg/dL   Creatinine, Ser 1.61 0.61 - 1.24 mg/dL   Calcium 8.3 (L) 8.9 - 10.3 mg/dL   Total Protein 6.9 6.5 - 8.1 g/dL   Albumin 2.6 (L) 3.5 - 5.0 g/dL   AST 40 15 - 41 U/L   ALT 42 0 - 44 U/L   Alkaline Phosphatase 129 (H) 38 - 126 U/L   Total Bilirubin 0.3 0.3 - 1.2 mg/dL   GFR, Estimated >09 >60 mL/min    Comment: (NOTE) Calculated using the CKD-EPI Creatinine Equation (2021)    Anion gap 9 5 - 15    Comment: Performed at [facility], [address] [place], [place] [address]  CBC with Differential     Status: Abnormal   Collection Time: 02/20/23  6:00 PM  Result Value  Ref Range   WBC 11.1 (H) 4.0 - 10.5 K/uL   RBC 3.35 (L) 4.22 - 5.81 MIL/uL   Hemoglobin 8.8 (L) 13.0 - 17.0 g/dL   HCT 81.1 (L) 91.4 - 78.2 %   MCV 81.5 80.0 - 100.0 fL   MCH 26.3 26.0 - 34.0 pg   MCHC 32.2 30.0 - 36.0 g/dL   RDW 95.6 (H) 21.3 - 08.6 %   Platelets 249 150 - 400 K/uL   nRBC 0.0 0.0 - 0.2 %   Neutrophils Relative % 81 %   Neutro Abs 8.9 (H) 1.7 - 7.7 K/uL   Lymphocytes Relative 9 %   Lymphs Abs 1.0 0.7 - 4.0 K/uL   Monocytes Relative 9 %   Monocytes Absolute 1.0 0.1 - 1.0 K/uL   Eosinophils Relative 0 %   Eosinophils Absolute 0.0 0.0 - 0.5 K/uL   Basophils Relative 0 %   Basophils Absolute 0.0 0.0 - 0.1 K/uL   Immature Granulocytes 1 %   Abs Immature Granulocytes 0.14 (H) 0.00 - 0.07 K/uL    Comment: Performed at [facility], [address], [place], [place] [address]  Brain natriuretic peptide     Status: Abnormal   Collection Time: 02/20/23  6:00 PM  Result Value Ref Range   B Natriuretic Peptide 106.0 (H) 0.0 - 100.0 pg/mL    Comment: Performed at [facility], [address], [place], [place] [address]  Troponin I (High Sensitivity)     Status: None   Collection Time: 02/20/23  6:00 PM  Result Value Ref Range   Troponin I (High Sensitivity) 9 <18 ng/L    Comment: (NOTE) Elevated high sensitivity troponin I (hsTnI) values and significant  changes across serial measurements may suggest ACS but many other  chronic and acute conditions are known to elevate hsTnI results.  Refer to the "Links" section for chest pain algorithms and additional  guidance. Performed at [facility], [address], [place], [place] [address]   Magnesium     Status: None   Collection Time: 02/20/23  6:00 PM  Result Value Ref Range   Magnesium 2.2 1.7 - 2.4 mg/dL    Comment: Performed at [facility], [address] [place], [place] [address]  Urinalysis, w/ Reflex to Culture (Infection Suspected)  -Urine, Clean Catch     Status: Abnormal   Collection Time: 02/20/23  8:23 PM  Result Value Ref Range   Specimen Source URINE, CLEAN CATCH    Color, Urine YELLOW YELLOW   APPearance CLEAR CLEAR   Specific Gravity, Urine 1.009 1.005 - 1.030   pH 5.0 5.0 - 8.0   Glucose, UA NEGATIVE NEGATIVE mg/dL   Hgb urine dipstick SMALL (A) NEGATIVE   Bilirubin Urine NEGATIVE NEGATIVE   Ketones, ur NEGATIVE NEGATIVE mg/dL   Protein, ur NEGATIVE NEGATIVE mg/dL   Nitrite NEGATIVE NEGATIVE   Leukocytes,Ua NEGATIVE NEGATIVE   RBC / HPF 0-5 0 - 5 RBC/hpf   WBC, UA 0-5 0 - 5 WBC/hpf    Comment:        Reflex urine culture not performed if WBC <=10, OR if Squamous epithelial cells >5. If Squamous epithelial cells >5 suggest recollection.    Bacteria, UA NONE SEEN NONE SEEN   Squamous Epithelial / HPF 0-5 0 - 5 /HPF   Mucus PRESENT     Comment: Performed at [facility], [address], [place], [place] [address]   DG Chest Port 1 View  Result Date: 02/20/2023 CLINICAL DATA:  Sepsis. EXAM: PORTABLE CHEST 1 VIEW COMPARISON:  August 02, 2022. FINDINGS: The heart size and mediastinal contours are within normal limits. Both lungs are clear. Right internal jugular Port-A-Cath is unchanged. The visualized skeletal structures are unremarkable. IMPRESSION: No active disease. Electronically Signed   By: Lupita Raider M.D.   On: 02/20/2023 17:59    Pending Labs Unresulted Labs (From admission, onward)     Start     Ordered   02/27/23 0500  Creatinine, serum  (enoxaparin (LOVENOX)    CrCl >/= 30 ml/min)  Weekly,   R     Comments: while on enoxaparin therapy    02/20/23 2044   02/21/23 0500  CBC with Differential/Platelet  Tomorrow morning,   R        02/20/23 2108   02/21/23 0500  Comprehensive metabolic panel  Tomorrow morning,   R        02/20/23 2108   02/21/23 0500  Magnesium  Tomorrow morning,   R        02/20/23 2108   02/21/23 0500  Phosphorus  Tomorrow morning,   R         02/20/23 2108   02/20/23 2046  MRSA Next Gen by PCR, Nasal  Once,   R        02/20/23 2045   02/20/23 1727  Blood Culture (routine x 2)  (Septic presentation on arrival (screening labs, nursing and treatment orders for obvious sepsis))  BLOOD CULTURE X 2,   STAT      02/20/23 1728            Vitals/Pain Today's Vitals   02/20/23 1715 02/20/23 2000 02/20/23 2053 02/20/23 2145  BP: 110/65 115/66  126/70  Pulse: 68 71  73  Resp: 20 19  (!) 21  Temp:   98.2 F (36.8 C)   TempSrc:   Oral   SpO2: 98% 98%  100%  Weight:      Height:      PainSc:        Isolation Precautions No active isolations  Medications Medications  enoxaparin (LOVENOX) injection 40 mg (40 mg Subcutaneous Given 02/20/23 2214)  cefTRIAXone (ROCEPHIN) 2 g in sodium  chloride 0.9 % 100 mL IVPB (has no administration in time range)  aspirin EC tablet 81 mg (has no administration in time range)  atorvastatin (LIPITOR) tablet 40 mg (40 mg Oral Given 02/20/23 2214)  escitalopram (LEXAPRO) tablet 20 mg (has no administration in time range)  feeding supplement (ENSURE ENLIVE / ENSURE PLUS) liquid 237 mL (has no administration in time range)  levothyroxine (SYNTHROID) tablet 112 mcg (has no administration in time range)  pantoprazole (PROTONIX) EC tablet 40 mg (40 mg Oral Given 02/20/23 2213)  cyanocobalamin (VITAMIN B12) tablet 1,000 mcg (1,000 mcg Oral Given 02/20/23 2214)  acetaminophen (TYLENOL) tablet 650 mg (has no administration in time range)  prochlorperazine (COMPAZINE) injection 5 mg (has no administration in time range)  polyethylene glycol (MIRALAX / GLYCOLAX) packet 17 g (has no administration in time range)  melatonin tablet 5 mg (has no administration in time range)  vancomycin (VANCOREADY) IVPB 750 mg/150 mL (has no administration in time range)  0.9 %  sodium chloride infusion ( Intravenous Rate/Dose Change 02/20/23 2201)  sodium chloride 0.9 % bolus 1,000 mL (0 mLs Intravenous Stopped 02/20/23 2048)   cefTRIAXone (ROCEPHIN) 2 g in sodium chloride 0.9 % 100 mL IVPB (0 g Intravenous Stopped 02/20/23 1839)  vancomycin (VANCOREADY) IVPB 1500 mg/300 mL (0 mg Intravenous Stopped 02/20/23 2047)    Mobility walks with person assist

## 2023-02-21 NOTE — Evaluation (Signed)
Occupational Therapy Evaluation Patient Details Name: Angel CHAM Sr. MRN: [id number] DOB: 1940-10-20 Today's Date: 02/21/2023   History of Present Illness Patient is a 82 year old male who presented on 7/8 with poor oral intake, and drainage from right forearm.Patient was admitted with dehydration, failure to thrive, severe protein calorie malnutrition and right forearm wound with purulence.  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   Clinical Impression   Patient is a 82 year old male who was admitted for above. Patient was living at home with daughter with recent d/c from hospital. Patient  currently has a strong posterior lean and instability with transfers,max A for Lb dressing tasks. Patient was noted to have decreased functional activity tolerance, decreased endurance, decreased standing balance, decreased safety awareness, and decreased knowledge of AD/AE impacting participation in ADLs. Patient would continue to benefit from skilled OT services at this time while admitted and after d/c to address noted deficits in order to improve overall safety and independence in ADLs.       Recommendations for follow up therapy are one component of a multi-disciplinary discharge planning process, led by the attending physician.  Recommendations may be updated based on patient status, additional functional criteria and insurance authorization.   Assistance Recommended at Discharge Frequent or constant Supervision/Assistance  Patient can return home with the following A lot of help with bathing/dressing/bathroom;Assistance with cooking/housework;Direct supervision/assist for medications management;Assist for transportation;Help with stairs or ramp for entrance;Direct  supervision/assist for financial management;A lot of help with walking and/or transfers    Functional Status Assessment  Patient has had a recent decline in their functional status and demonstrates the ability to make significant improvements in function in a reasonable and predictable amount of time.  Equipment Recommendations  None recommended by OT       Precautions / Restrictions Precautions Precautions: Fall Precaution Comments: R shoulder and upper arm with skin tears from fall Restrictions Weight Bearing Restrictions: No      Mobility Bed Mobility Overal bed mobility: Needs Assistance Bed Mobility: Supine to Sit, Sit to Supine     Supine to sit: Mod assist Sit to supine: Min assist   General bed mobility comments: assist for LEs over EOB and scooting to EOB with bed pad        Balance Overall balance assessment: History of Falls, Needs assistance Sitting-balance support: Bilateral upper extremity supported, Feet supported Sitting balance-Leahy Scale: Fair     Standing balance support: Reliant on assistive device for balance, Bilateral upper extremity supported Standing balance-Leahy Scale: Poor Standing balance comment: posterior leaning needing physical A to maintain balance.           ADL either performed or assessed with clinical judgement   ADL Overall ADL's : Needs assistance/impaired Eating/Feeding: Supervision/ safety;Sitting   Grooming: Minimal assistance;Sitting Grooming Details (indicate cue type and reason): EOB to wash face with wash cloth with some reminance of lunch still in beard. Upper Body Bathing: Min guard;Sitting   Lower Body Bathing: Maximal assistance;Sitting/lateral leans;Sit to/from stand   Upper Body Dressing : Min guard;Sitting   Lower Body Dressing: Maximal assistance;Sitting/lateral leans;Sit to/from Market researcher Details (indicate cue type and reason): patient noted to have strong posterior lean with RW in  standing. patient uanble to correct and did not appear to be aware  of leaning. patient able to transfer to mat in room with RW with mod A to maintain balance. patient unable to control landing into chair. Toileting- Clothing Manipulation and Hygiene: Maximal assistance;Sitting/lateral lean;Sit to/from stand                Pertinent Vitals/Pain Pain Assessment Pain Assessment: No/denies pain     Hand Dominance Right   Extremity/Trunk Assessment Upper Extremity Assessment Upper Extremity Assessment: RUE deficits/detail RUE Deficits / Details: reported pain in shoulder, ableto AROM shoulder to aobut 90 degrees and touch top of head bilaterally. patient reported having fallen on this shoulder. noted to have dressings over glenohumeral joint, olecranon process, and proximal forearm.   Lower Extremity Assessment Lower Extremity Assessment: Defer to PT evaluation   Cervical / Trunk Assessment Cervical / Trunk Assessment: Normal   Communication Communication Communication: HOH   Cognition Arousal/Alertness: Awake/alert Behavior During Therapy: WFL for tasks assessed/performed Overall Cognitive Status: Within Functional Limits for tasks assessed                    Home Living Family/patient expects to be discharged to:: Private residence Living Arrangements: Children;Other relatives Available Help at Discharge: Family;Available 24 hours/day (daughter) Type of Home: House Home Access: Stairs to enter [employer] of Steps: 3 Entrance Stairs-Rails: Left;Right Home Layout: One level     Bathroom Shower/Tub: Chief Strategy Officer: Standard Bathroom Accessibility: Yes   Home Equipment: Agricultural consultant (2 wheels)          Prior Functioning/Environment Prior Level of Function : Needs assist             Mobility Comments: independent with ambulation; ADLs Comments: daughter drives him and does IADLs ; Ind with ADLs        OT Problem  List: Decreased activity tolerance;Impaired balance (sitting and/or standing);Decreased coordination;Decreased knowledge of use of DME or AE;Decreased knowledge of precautions;Decreased safety awareness      OT Treatment/Interventions: Self-care/ADL training;Energy conservation;Therapeutic exercise;DME and/or AE instruction;Therapeutic activities;Patient/family education;Balance training    OT Goals(Current goals can be found in the care plan section) Acute Rehab OT Goals Patient Stated Goal: to go home OT Goal Formulation: With patient Time For Goal Achievement: 03/07/23 Potential to Achieve Goals: Fair  OT Frequency: Min 1X/week       AM-PAC OT "6 Clicks" Daily Activity     Outcome Measure Help from another person eating meals?: A Little Help from another person taking care of personal grooming?: A Little Help from another person toileting, which includes using toliet, bedpan, or urinal?: A Lot Help from another person bathing (including washing, rinsing, drying)?: A Lot Help from another person to put on and taking off regular upper body clothing?: A Little Help from another person to put on and taking off regular lower body clothing?: A Lot 6 Click Score: 15   End of Session Equipment Utilized During Treatment: Gait belt;Rolling walker (2 wheels) Nurse Communication: Mobility status  Activity Tolerance: Patient tolerated treatment well Patient left: in bed;with call bell/phone within reach;with bed alarm set  OT Visit Diagnosis: Unsteadiness on feet (R26.81);Other abnormalities of gait and mobility (R26.89);Pain                Time: 1610-9604 OT Time Calculation (min): 17 min Charges:  OT General Charges $OT Visit: 1 Visit OT Evaluation $OT Eval Low Complexity: 1 Low  Emanuel Dowson OTR/L, MS Acute Rehabilitation Department Office# [phone]   Selinda Flavin 02/21/2023, 3:25 PM

## 2023-02-21 NOTE — Progress Notes (Signed)
[facility] PROGRESS NOTE    Progress Note  Angel Keller  ZOX:[id number] DOB: 01/06/1941 DOA: 02/20/2023 PCP: Carlean Jews, NP     Brief Narrative:   Angel Hauger. is an 81 y.o. 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   Assessment/Plan:   Generalized weakness multifactorial likely due to dehydration, physical debility/ Failure to thrive in adult: PT OT have been consulted. We encourage oral intake Ensure 3 times daily. TOC has been consulted for disposition.  Dehydration/severe protein caloric malnutrition: BMI of 16, severe muscle loss. Encourage oral protein intake. He has failed Megace as an outpatient. Ensure 3 times daily.  Right forearm wound possible purulent cellulitis: On IV vancomycin and Rocephin. Blood cultures have been obtained negative till date. Had a Tmax of 100.5, now has defervesced. Leukocytosis also improved.  Prolonged QTc: Try to keep potassium greater than 4 magnesium greater than 2. Discontinue telemetry.  Hypothyroidism: Continue Synthroid.  Chronic anxiety/depression: Resume Lexapro.  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 Mosetta Putt medical oncology has been consulted. Restaging CT on 08/17/2022 showed new at least significant increase compared to prior CT on 05/03/2022 small soft tissue nodule lymph node interposed in the anterior aorta and portal vein suspicion for local recurrence or nodal metastases. Continue Promacta. Will need to discuss goals of care. Family would like to talk to palliative care.  Essential  hypertension Blood pressures at goal continue to hold Norvasc.  GERD: Continue PPI.    DVT prophylaxis: lovenox Family Communication:none Status is: Inpatient Remains inpatient appropriate because: Cellulitis dehydration    Code Status:     Code Status Orders  (From admission, onward)           Start     Ordered   02/20/23 2045  Full code  Continuous       Question:  By:  Answer:  Consent: discussion documented in EHR   02/20/23 2044           Code Status History     Date Active Date Inactive Code Status Order ID Comments User Context   02/17/2023 2001 02/19/2023 2024 Full Code [id number]  Alan Mulder, MD ED   09/16/2022 1510 09/17/2022 2306 Full Code [id number]  Jonah Blue, MD ED   07/28/2022 1206 07/29/2022 0510 Full Code [id number]  Berdine Dance, MD HOV   11/15/2021 1533 11/22/2021 1746 Full Code [id number]  Fritzi Mandes, MD Inpatient   08/28/2021 0410 09/01/2021 0011 Full Code [id number]  Chotiner, Claudean Severance, MD ED   07/18/2021 1953 07/23/2021 2225 Full Code [id number]  Synetta Fail, MD ED   07/27/2020 1942 07/31/2020 1800 Full Code [id number]  Arnetha Courser, MD ED   10/21/2015 1501 10/22/2015 1846 Full Code [id number]  Julieanne Cotton, MD Inpatient         IV Access:   Peripheral IV   Procedures and diagnostic studies:   DG Chest Port 1 View  Result Date: 02/20/2023 CLINICAL DATA:  Sepsis. EXAM: PORTABLE CHEST 1 VIEW COMPARISON:  August 02, 2022. FINDINGS: The heart size and mediastinal contours are within normal limits. Both lungs are clear. Right internal jugular Port-A-Cath is unchanged. The visualized skeletal structures  are unremarkable. IMPRESSION: No active disease. Electronically Signed   By: Lupita Raider M.D.   On: 02/20/2023 17:59     Medical Consultants:   None.   Subjective:    Angel Frederic Sr. No appetite.  Objective:    Vitals:   02/20/23 2145 02/21/23 0101 02/21/23 0102 02/21/23 0447  BP: 126/70 128/72  (!) 140/66   Pulse: 73 72  70  Resp: (!) 21 20  17   Temp:  98 F (36.7 C)  (!) 97.3 F (36.3 C)  TempSrc:      SpO2: 100% 98%  98%  Weight:   55.8 kg   Height:       SpO2: 98 %   Intake/Output Summary (Last 24 hours) at 02/21/2023 0811 Last data filed at 02/20/2023 2349 Gross per 24 hour  Intake 2005 ml  Output --  Net 2005 ml   Filed Weights   02/20/23 1659 02/21/23 0102  Weight: 60 kg 55.8 kg    Exam: General exam: In no acute distress, cachetic Respiratory system: Good air movement and clear to auscultation. Cardiovascular system: S1 & S2 heard, RRR. No JVD. Gastrointestinal system: Abdomen is nondistended, soft and nontender.  Extremities: No pedal edema. Skin: No rashes, lesions or ulcers Psychiatry: Judgement and insight appear normal. Mood & affect appropriate.   Data Reviewed:    Labs: Basic Metabolic Panel: Recent Labs  Lab 02/17/23 1456 02/18/23 0500 02/20/23 1800 02/21/23 0546  NA 130* 131* 133* 135  K 3.4* 3.3* 4.0 3.6  CL 101 101 102 104  CO2 21* 23 22 20*  GLUCOSE 102* 93 108* 151*  BUN 30* 18 19 20   CREATININE 1.16 0.86 1.14 1.01  CALCIUM 8.2* 8.1* 8.3* 8.2*  MG  --  2.5* 2.2 2.3  PHOS  --   --   --  3.9   GFR Estimated Creatinine Clearance: 43.7 mL/min (by C-G formula based on SCr of 1.01 mg/dL). Liver Function Tests: Recent Labs  Lab 02/17/23 1456 02/20/23 1800 02/21/23 0546  AST 73* 40 38  ALT 53* 42 39  ALKPHOS 119 129* 123  BILITOT 0.3 0.3 0.3  PROT 7.2 6.9 6.5  ALBUMIN 2.9* 2.6* 2.4*   No results for input(s): "LIPASE", "AMYLASE" in the last 168 hours. No results for input(s): "AMMONIA" in the last 168 hours. Coagulation profile No results for input(s): "INR", "PROTIME" in the last 168 hours. COVID-19 Labs  No results for input(s): "DDIMER", "FERRITIN", "LDH", "CRP" in the last 72 hours.  Lab Results  Component Value Date   SARSCOV2NAA NEGATIVE 02/20/2023   SARSCOV2NAA NEGATIVE 08/27/2021   SARSCOV2NAA NEGATIVE 07/18/2021    SARSCOV2NAA POSITIVE (A) 07/27/2020    CBC: Recent Labs  Lab 02/17/23 1456 02/18/23 0500 02/20/23 1800 02/21/23 0546  WBC 7.7 7.8 11.1* 9.6  NEUTROABS  --   --  8.9* 7.6  HGB 7.7* 8.5* 8.8* 8.3*  HCT 23.8* 26.1* 27.3* 25.7*  MCV 80.7 80.8 81.5 81.6  PLT 209 233 249 276   Cardiac Enzymes: No results for input(s): "CKTOTAL", "CKMB", "CKMBINDEX", "TROPONINI" in the last 168 hours. BNP (last 3 results) No results for input(s): "PROBNP" in the last 8760 hours. CBG: No results for input(s): "GLUCAP" in the last 168 hours. D-Dimer: No results for input(s): "DDIMER" in the last 72 hours. Hgb A1c: No results for input(s): "HGBA1C" in the last 72 hours. Lipid Profile: No results for input(s): "CHOL", "HDL", "LDLCALC", "TRIG", "CHOLHDL", "LDLDIRECT" in the last 72 hours. Thyroid function studies:  No results for input(s): "TSH", "T4TOTAL", "T3FREE", "THYROIDAB" in the last 72 hours.  Invalid input(s): "FREET3" Anemia work up: No results for input(s): "VITAMINB12", "FOLATE", "FERRITIN", "TIBC", "IRON", "RETICCTPCT" in the last 72 hours. Sepsis Labs: Recent Labs  Lab 02/17/23 1456 02/18/23 0500 02/20/23 1800 02/21/23 0546  WBC 7.7 7.8 11.1* 9.6  LATICACIDVEN  --   --  1.1  --    Microbiology Recent Results (from the past 240 hour(s))  Resp panel by RT-PCR (RSV, Flu A&B, Covid) Anterior Nasal Swab     Status: None   Collection Time: 02/20/23  6:00 PM   Specimen: Anterior Nasal Swab  Result Value Ref Range Status   SARS Coronavirus 2 by RT PCR NEGATIVE NEGATIVE Final    Comment: (NOTE) SARS-CoV-2 target nucleic acids are NOT DETECTED.  The SARS-CoV-2 RNA is generally detectable in upper respiratory specimens during the acute phase of infection. The lowest concentration of SARS-CoV-2 viral copies this assay can detect is 138 copies/mL. A negative result does not preclude SARS-Cov-2 infection and should not be used as the sole basis for treatment or other patient management  decisions. A negative result may occur with  improper specimen collection/handling, submission of specimen other than nasopharyngeal swab, presence of viral mutation(s) within the areas targeted by this assay, and inadequate number of viral copies(<138 copies/mL). A negative result must be combined with clinical observations, patient history, and epidemiological information. The expected result is Negative.  Fact Sheet for Patients:  BloggerCourse.com  Fact Sheet for Healthcare Providers:  SeriousBroker.it  This test is no t yet approved or cleared by the [place] FDA and  has been authorized for detection and/or diagnosis of SARS-CoV-2 by FDA under an Emergency Use Authorization (EUA). This EUA will remain  in effect (meaning this test can be used) for the duration of the COVID-19 declaration under Section 564(b)(1) of the Act, 21 U.S.C.section 360bbb-3(b)(1), unless the authorization is terminated  or revoked sooner.       Influenza A by PCR NEGATIVE NEGATIVE Final   Influenza B by PCR NEGATIVE NEGATIVE Final    Comment: (NOTE) The Xpert Xpress SARS-CoV-2/FLU/RSV plus assay is intended as an aid in the diagnosis of influenza from Nasopharyngeal swab specimens and should not be used as a sole basis for treatment. Nasal washings and aspirates are unacceptable for Xpert Xpress SARS-CoV-2/FLU/RSV testing.  Fact Sheet for Patients: BloggerCourse.com  Fact Sheet for Healthcare Providers: SeriousBroker.it  This test is not yet approved or cleared by the [place] FDA and has been authorized for detection and/or diagnosis of SARS-CoV-2 by FDA under an Emergency Use Authorization (EUA). This EUA will remain in effect (meaning this test can be used) for the duration of the COVID-19 declaration under Section 564(b)(1) of the Act, 21 U.S.C. section 360bbb-3(b)(1), unless the  authorization is terminated or revoked.     Resp Syncytial Virus by PCR NEGATIVE NEGATIVE Final    Comment: (NOTE) Fact Sheet for Patients: BloggerCourse.com  Fact Sheet for Healthcare Providers: SeriousBroker.it  This test is not yet approved or cleared by the [place] FDA and has been authorized for detection and/or diagnosis of SARS-CoV-2 by FDA under an Emergency Use Authorization (EUA). This EUA will remain in effect (meaning this test can be used) for the duration of the COVID-19 declaration under Section 564(b)(1) of the Act, 21 U.S.C. section 360bbb-3(b)(1), unless the authorization is terminated or revoked.  Performed at [facility], [address] [place], [place] [address]   Blood  Culture (routine x 2)     Status: None (Preliminary result)   Collection Time: 02/20/23  6:00 PM   Specimen: BLOOD  Result Value Ref Range Status   Specimen Description   Final    BLOOD RIGHT ANTECUBITAL Performed at [facility], [address], [place], [place] [address]    Special Requests   Final    BOTTLES DRAWN AEROBIC AND ANAEROBIC Blood Culture adequate volume Performed at [facility], [address] [place], [place] [address]    Culture   Final    NO GROWTH < 12 HOURS Performed at [facility], [address], [place], [place] [address]    Report Status PENDING  Incomplete  Blood Culture (routine x 2)     Status: None (Preliminary result)   Collection Time: 02/20/23  6:00 PM   Specimen: BLOOD  Result Value Ref Range Status   Specimen Description   Final    BLOOD LEFT ANTECUBITAL Performed at [facility], [address] [place], [place] [address]    Special Requests   Final    BOTTLES DRAWN AEROBIC ONLY Blood Culture adequate volume Performed at [facility], [address] [place], [place] [address]    Culture    Final    NO GROWTH < 12 HOURS Performed at [facility], [address] [place], [place] [address]    Report Status PENDING  Incomplete     Medications:    aspirin EC  81 mg Oral Daily   atorvastatin  40 mg Oral QHS   cyanocobalamin  1,000 mcg Oral BID   enoxaparin (LOVENOX) injection  40 mg Subcutaneous Q24H   escitalopram  20 mg Oral Daily   feeding supplement  237 mL Oral BID BM   levothyroxine  112 mcg Oral QAC breakfast   pantoprazole  40 mg Oral BID   Continuous Infusions:  sodium chloride 50 mL/hr at 02/20/23 2201   cefTRIAXone (ROCEPHIN)  IV     vancomycin        LOS: 1 day   Marinda Elk  [facility]  02/21/2023, 8:11 AM

## 2023-02-21 NOTE — Consult Note (Addendum)
WOC Nurse Consult Note: Reason for Consult: Consult requested for right arm.  Previously reported to have pus draining from previous skin tears which occurred several days ago. Wound type: Right upper arm full thickness skin tear 1X1X.1cm, red and moist, small amt yellow drainage Right lower elbow with full thickness skin tear; skin approximated over 90%, 10% red and moist with small amt bloody drainage, 4X4X.2cm.  Applied steristrips to assist with holding skin in place.  Right anterior arm with full thickness dry brown scabbed wound, moistened with NS and removed easily, revealing red moist full thickness wound without pus or drainage; 4X1.5X.2cm Dressing procedure/placement/frequency: Topical treatment orders provided for bedside nurses to perform as follows: Change dressing to right arm Q TUES/FRI/SUN as follows: leave steristrips in place until they fall off. Apply Xeroform over open wounds to elbow and anterior arm, then cover all areas with foam dressings.  Please re-consult if further assistance is needed.  Thank-you,  Cammie Mcgee MSN, RN, CWOCN, [place], CNS [phone]

## 2023-02-21 NOTE — Consult Note (Signed)
Consultation Note Date: 02/21/2023   Patient Name: Angel Keller  DOB: 01-Nov-1940  MRN: [id number]  Age / Sex: 82 y.o., male  PCP: Carlean Jews, NP Referring Physician: David Stall, Darin Engels, MD  Reason for Consultation: Establishing goals of care  HPI/Patient Profile: 82 y.o. male admitted on 02/20/2023   Angel Petrak. is an 82 y.o. 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   Clinical Assessment and Goals of Care: Patient remains admitted to hospital medicine service for generalized weakness due to dehydration, physical debility/failure to thrive, dehydration, severe protein calorie malnutrition.  Patient has right forearm wound-possible purulent cellulitis.  Local recurrence of pancreatic cancer. Has ongoing functional status decline. Palliative consult for ongoing goals of care discussions. Patient participated some with OT earlier today. Patient seen and examined.  Discussed with patient's daughter Angel Keller who was present at the bedside. Chart reviewed. Palliative medicine is specialized medical care for people living with serious illness. It focuses on providing relief from the symptoms and stress of a serious illness. The goal is to improve quality of life for both the patient and the family. Goals of care: Broad aims of medical therapy in relation to the patient's values and preferences. Our aim is to provide medical care aimed at enabling patients to achieve the goals that matter most to them, given the circumstances of their particular  medical situation and their constraints.    NEXT OF KIN  Daughter Angel Keller who is his primary caregiver Son Angel Keller   SUMMARY OF RECOMMENDATIONS   Brief goals of care discussions undertaken.  Patient not able to participate for longer, tired from OT attempt.  Discussed with patient's daughter Angel Keller.  She states that the patient told her 2 weeks ago that he believes that his cancer came back.  She states that the patient told her that if there was a recurrence, he does not believe that he would want to undergo treatments.  They are waiting to discuss further with Dr. Mosetta Putt from medical oncology. Offered brief overview of differences between hospice and palliative, offered brief overview of full code versus DNR/DNI today, at the time of initial palliative consultation. PMT will continue to follow hospital course and be available for further ongoing goals of care discussions.  Code Status/Advance Care Planning: Full code   Symptom Management:     Palliative Prophylaxis:  Frequent Pain Assessment  Additional Recommendations (Limitations, Scope, Preferences): Full Scope Treatment  Psycho-social/Spiritual:  Desire for further Chaplaincy support:yes Additional Recommendations: Caregiving  Support/Resources  Prognosis:  Unable to determine  Discharge Planning: To Be Determined      Primary Diagnoses: Present on Admission:  Local recurrence of pancreatic cancer [facility])  Essential hypertension  Dehydration   I have reviewed the medical record, interviewed the patient and family, and examined the patient. The following aspects are pertinent.  Past Medical History:  Diagnosis Date   Anxiety    Arthritis    Depression    Family history of breast cancer    Family history of lung cancer    Headache    HLD (hyperlipidemia)    Hx of inguinal hernia repair    Hypertension    Hypothyroidism    Nausea with vomiting 09/21/2021   Overactive bladder    Pancreatic cancer (HCC)     Pneumonia    Shingles    Sleep apnea    Patient denies   Thoracic ascending aortic aneurysm (HCC)    mildly dilated 4.0cm per 08/12/21 CT   Thyroid disease    Varicose veins of both lower extremities    Social History   Socioeconomic History   Marital status: Widowed    Spouse name: Not on file   Number of children: 5   Years of education: Not on file   Highest education level: Not on file  Occupational History   Occupation: retired  Tobacco Use   Smoking status: Former    Packs/day: 0.25    Years: 1.00    Additional pack years: 0.00    Total pack years: 0.25    Types: Cigarettes    Quit date: 08/15/1961    Years since quitting: 61.5   Smokeless tobacco: Never  Vaping Use   Vaping Use: Never used  Substance and Sexual Activity   Alcohol use: No   Drug use: No   Sexual activity: Not Currently  Other Topics Concern   Not on file  Social History Narrative   Not on file   Social Determinants of Health   Financial Resource Strain: High Risk (03/10/2022)   Overall Financial Resource Strain (CARDIA)    Difficulty of Paying Living Expenses: Hard  Food Insecurity: No Food Insecurity (02/21/2023)   Hunger Vital Sign    Worried About Running Out of Food in the Last Year: Never true    Ran Out of Food in the Last Year: Never true  Transportation Needs: No Transportation Needs (02/21/2023)   PRAPARE - Administrator, Civil Service (Medical): No    Lack of Transportation (Non-Medical): No  Physical Activity: Not on file  Stress: Not on file  Social Connections: Not on file   Family History  Problem Relation Age of Onset   Breast cancer Mother    Lung cancer Father    Alcoholism Sister    Cirrhosis Sister    Colon cancer Neg Hx    Esophageal cancer Neg Hx    Liver cancer Neg Hx    Pancreatic cancer Neg Hx    Prostate cancer Neg Hx    Scheduled Meds:  aspirin EC  81 mg Oral Daily   atorvastatin  40 mg Oral QHS   cyanocobalamin  1,000 mcg Oral BID    enoxaparin (LOVENOX) injection  40 mg Subcutaneous Q24H   escitalopram  20 mg Oral Daily   feeding supplement  237 mL Oral BID BM   levothyroxine  112 mcg Oral QAC breakfast   pantoprazole  40 mg Oral BID   Continuous Infusions:  sodium chloride 50 mL/hr at 02/20/23 2201   cefTRIAXone (ROCEPHIN)  IV     vancomycin     PRN Meds:.acetaminophen, melatonin, polyethylene glycol, prochlorperazine Medications Prior to Admission:  Prior to Admission medications  Medication Sig Start Date End Date Taking? Authorizing Provider  acetaminophen (TYLENOL) 500 MG tablet Take 1,000 mg by mouth as needed for headache.   Yes [provider]  amLODipine (NORVASC) 5 MG tablet TAKE 1 TABLET (5 MG TOTAL) BY MOUTH DAILY. 11/17/22 05/16/23 Yes Carlean Jews, NP  aspirin EC 81 MG tablet Take 1 tablet (81 mg total) by mouth daily. Swallow whole. 02/18/23 03/20/23 Yes Calvert Cantor, MD  atorvastatin (LIPITOR) 40 MG tablet TAKE 1 TABLET (40 MG TOTAL) BY MOUTH AT BEDTIME. 02/02/23  Yes Boscia, Heather E, NP  eltrombopag (PROMACTA) 50 MG tablet Take 1 tablet (50 mg total) by mouth daily. Take on an empty stomach 1 hour before a meal or 2 hours after 11/11/22  Yes Malachy Mood, MD  escitalopram (LEXAPRO) 20 MG tablet Take 1 tablet (20 mg total) by mouth daily. 09/26/22  Yes Boscia, Kathlynn Grate, NP  feeding supplement (ENSURE ENLIVE / ENSURE PLUS) LIQD Take 237 mLs by mouth 2 (two) times daily between meals. Patient taking differently: Take 237 mLs by mouth daily. When compliant 02/19/23  Yes Calvert Cantor, MD  levothyroxine (SYNTHROID) 112 MCG tablet Take 1 tablet (112 mcg total) by mouth daily before breakfast. 09/26/22  Yes Boscia, Heather E, NP  lidocaine-prilocaine (EMLA) cream Apply topically to affected area(s) as needed. Patient taking differently: Apply 1 Application topically as needed (port). 08/24/22  Yes Ronny Bacon, PA-C  lipase/protease/amylase (CREON) 36000 UNITS CPEP capsule Take 1 capsule (36,000  Units total) by mouth 3 (three) times daily before meals. 08/24/22  Yes Malachy Mood, MD  megestrol (MEGACE ES) 625 MG/5ML suspension Take 5 mLs (625 mg total) by mouth daily. 01/02/23  Yes Boscia, Kathlynn Grate, NP  mirtazapine (REMERON) 7.5 MG tablet Take 1 tablet (7.5 mg total) by mouth at bedtime. 11/01/22  Yes Boscia, Heather E, NP  pantoprazole (PROTONIX) 40 MG tablet Take 1 tablet (40 mg total) by mouth 2 (two) times daily. 01/02/23  Yes Carlean Jews, NP   No Known Allergies Review of Systems +weakness  Physical Exam No acute distress Appears cachectic Appears with generalized weakness Has dressings on upper extremity No edema  Vital Signs: BP 120/74 (BP Location: Right Arm)   Pulse 69   Temp 98.2 F (36.8 C) (Oral)   Resp 16   Ht 6' (1.829 m)   Wt 55.8 kg   SpO2 100%   BMI 16.68 kg/m  Pain Scale: 0-10   Pain Score: Asleep   SpO2: SpO2: 100 % O2 Device:SpO2: 100 % O2 Flow Rate: .   IO: Intake/output summary:  Intake/Output Summary (Last 24 hours) at 02/21/2023 1441 Last data filed at 02/20/2023 2349 Gross per 24 hour  Intake 2005 ml  Output --  Net 2005 ml    LBM: Last BM Date :  (pta) Baseline Weight: Weight: 60 kg Most recent weight: Weight: 55.8 kg     Palliative Assessment/Data:   PPS 50%  Time In:  1420 Time Out:  1520 Time Total:  60  Greater than 50%  of this time was spent counseling and coordinating care related to the above assessment and plan.  Signed by: Rosalin Hawking, MD   Please contact Palliative Medicine Team phone at [phone] for questions and concerns.  For individual provider: See Loretha Stapler

## 2023-02-21 NOTE — Progress Notes (Signed)
Pt arrived to unit via stretcher  to room 1501. Alert and oriented x4. Oriented to room and callbell with no complications. Initial assessment and 2 RN assessment completed. Callbell placed in view. Plan of care ongoing

## 2023-02-22 DIAGNOSIS — C259 Malignant neoplasm of pancreas, unspecified: Secondary | ICD-10-CM | POA: Diagnosis not present

## 2023-02-22 DIAGNOSIS — L03113 Cellulitis of right upper limb: Secondary | ICD-10-CM | POA: Diagnosis not present

## 2023-02-22 DIAGNOSIS — R627 Adult failure to thrive: Secondary | ICD-10-CM

## 2023-02-22 MED ORDER — ADULT MULTIVITAMIN W/MINERALS CH
1.0000 | ORAL_TABLET | Freq: Every day | ORAL | Status: DC
  Administered 2023-02-22 – 2023-02-24 (×3): 1 via ORAL
  Filled 2023-02-22 (×3): qty 1

## 2023-02-22 MED ORDER — DOXYCYCLINE HYCLATE 100 MG PO TABS
100.0000 mg | ORAL_TABLET | Freq: Two times a day (BID) | ORAL | Status: DC
  Administered 2023-02-22 – 2023-02-24 (×4): 100 mg via ORAL
  Filled 2023-02-22 (×5): qty 1

## 2023-02-22 MED ORDER — ENSURE ENLIVE PO LIQD
237.0000 mL | ORAL | Status: DC
  Administered 2023-02-22 – 2023-02-23 (×5): 237 mL via ORAL

## 2023-02-22 MED ORDER — DOXYCYCLINE HYCLATE 100 MG PO TABS
100.0000 mg | ORAL_TABLET | Freq: Two times a day (BID) | ORAL | Status: DC
  Administered 2023-02-22: 100 mg via ORAL
  Filled 2023-02-22: qty 1

## 2023-02-22 NOTE — Progress Notes (Signed)
Initial Nutrition Assessment  DOCUMENTATION CODES:   Severe malnutrition in context of chronic illness, Underweight  INTERVENTION:   -Ensure Plus High Protein po TID, each supplement provides 350 kcal and 20 grams of protein. More likely to drink if chilled and chocolate flavor.  -Multivitamin with minerals daily -crushed with applesauce   NUTRITION DIAGNOSIS:   Severe Malnutrition related to chronic illness, cancer and cancer related treatments as evidenced by severe fat depletion, severe muscle depletion.  GOAL:   Patient will meet greater than or equal to 90% of their needs  MONITOR:   PO intake, Supplement acceptance, Labs, Weight trends, I & O's, Skin  REASON FOR ASSESSMENT:   Consult Assessment of nutrition requirement/status  ASSESSMENT:   82 y.o. 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  Patient in room, no family at bedside. Pt currently consuming 50-100% of meals with feeding assistance. States he mainly had trouble swallowing pills. Likes chocolate Ensure that is cold, so will add to order. Pt willing to try MVI but crushed so he can swallow easier.  Pt no longer a candidate for chemotherapy. Plan is for him to discharge to SNF.   Per weight records, no significant weight changes noted. Current weight: 127 lbs.  Medications: Vitamin B-12  Labs reviewed.  NUTRITION - FOCUSED PHYSICAL EXAM:  Flowsheet Row Most Recent Value  Orbital Region Severe depletion  Upper Arm Region Severe depletion  Thoracic and Lumbar Region Severe depletion  Buccal Region Severe depletion  Temple Region Severe  depletion  Clavicle Bone Region Severe depletion  Clavicle and Acromion Bone Region Severe depletion  Scapular Bone Region Severe depletion  Dorsal Hand Severe depletion  Patellar Region Severe depletion  Anterior Thigh Region Severe depletion  Posterior Calf Region Severe depletion  Edema (RD Assessment) None  Hair Unable to assess  Eyes Reviewed  Mouth Reviewed  Skin Reviewed  Nails Reviewed       Diet Order:   Diet Order             Diet regular Fluid consistency: Thin  Diet effective now                   EDUCATION NEEDS:   No education needs have been identified at this time  Skin:  Skin Assessment: Skin Integrity Issues: Skin Integrity Issues:: Other (Comment) Other: skin tears of right arm  Last BM:  PTA  Height:   Ht Readings from Last 1 Encounters:  02/20/23 6' (1.829 m)    Weight:   Wt Readings from Last 1 Encounters:  02/22/23 57.7 kg    BMI:  Body mass index is 17.25 kg/m.  Estimated Nutritional Needs:   Kcal:  1800-2000  Protein:  90-100g  Fluid:  2L/day  Tilda Franco, MS, RD, LDN Inpatient Clinical Dietitian Contact information available via Amion

## 2023-02-22 NOTE — Progress Notes (Addendum)
Angel Keller   DOB:14-Feb-1941   FG#:[id number]   RCV#:[id number]  Medical oncology follow-up note  Subjective: Pt is well-known to me, under my care for pancreatic cancer, he is currently on surveillance.  His overall health has deteriorated in the past few months, with multiple falls, skin tear and wound in the right arm, etc. he presented with worsening fatigue, anorexia, not able to function and dehydration.  He was admitted for further management.  He remains to be weak, did not get out of bed today.  His 2 daughters and son were at bedside when I saw him in the late afternoon.  He is frustrated that he is not able to function.   Objective:  Vitals:   02/22/23 0530 02/22/23 1718  BP: (!) 159/81 (!) 149/81  Pulse: 75 79  Resp: 16 (!) 21  Temp: 98.4 F (36.9 C) 99.1 F (37.3 C)  SpO2: 98% 97%    Body mass index is 17.25 kg/m.  Intake/Output Summary (Last 24 hours) at 02/22/2023 1856 Last data filed at 02/22/2023 1558 Gross per 24 hour  Intake 740 ml  Output 1475 ml  Net -735 ml              Chronically cachectic appearance  Sclerae unicteric  Oropharynx clear  No peripheral adenopathy  Lungs clear -- no rales or rhonchi  Heart regular rate and rhythm  Abdomen benign  MSK no focal spinal tenderness, no peripheral edema  Neuro nonfocal    CBG (last 3)  No results for input(s): "GLUCAP" in the last 72 hours.   Labs:   Urine Studies No results for input(s): "UHGB", "CRYS" in the last 72 hours.  Invalid input(s): "UACOL", "UAPR", "USPG", "UPH", "UTP", "UGL", "UKET", "UBIL", "UNIT", "UROB", "ULEU", "UEPI", "UWBC", "URBC", "UBAC", "CAST", "UCOM", "BILUA"  Basic Metabolic Panel: Recent Labs  Lab 02/17/23 1456 02/18/23 0500 02/20/23 1800 02/21/23 0546  NA 130* 131* 133* 135  K 3.4* 3.3* 4.0 3.6  CL 101 101 102 104  CO2 21* 23 22 20*  GLUCOSE 102* 93 108* 151*  BUN 30* 18 19 20   CREATININE 1.16 0.86 1.14 1.01  CALCIUM 8.2* 8.1* 8.3* 8.2*  MG  --  2.5* 2.2 2.3   PHOS  --   --   --  3.9   GFR Estimated Creatinine Clearance: 45.2 mL/min (by C-G formula based on SCr of 1.01 mg/dL). Liver Function Tests: Recent Labs  Lab 02/17/23 1456 02/20/23 1800 02/21/23 0546  AST 73* 40 38  ALT 53* 42 39  ALKPHOS 119 129* 123  BILITOT 0.3 0.3 0.3  PROT 7.2 6.9 6.5  ALBUMIN 2.9* 2.6* 2.4*   No results for input(s): "LIPASE", "AMYLASE" in the last 168 hours. No results for input(s): "AMMONIA" in the last 168 hours. Coagulation profile No results for input(s): "INR", "PROTIME" in the last 168 hours.  CBC: Recent Labs  Lab 02/17/23 1456 02/18/23 0500 02/20/23 1800 02/21/23 0546  WBC 7.7 7.8 11.1* 9.6  NEUTROABS  --   --  8.9* 7.6  HGB 7.7* 8.5* 8.8* 8.3*  HCT 23.8* 26.1* 27.3* 25.7*  MCV 80.7 80.8 81.5 81.6  PLT 209 233 249 276   Cardiac Enzymes: No results for input(s): "CKTOTAL", "CKMB", "CKMBINDEX", "TROPONINI" in the last 168 hours. BNP: Invalid input(s): "POCBNP" CBG: No results for input(s): "GLUCAP" in the last 168 hours. D-Dimer No results for input(s): "DDIMER" in the last 72 hours. Hgb A1c No results for input(s): "HGBA1C" in the last 72 hours.  Lipid Profile No results for input(s): "CHOL", "HDL", "LDLCALC", "TRIG", "CHOLHDL", "LDLDIRECT" in the last 72 hours. Thyroid function studies No results for input(s): "TSH", "T4TOTAL", "T3FREE", "THYROIDAB" in the last 72 hours.  Invalid input(s): "FREET3" Anemia work up No results for input(s): "VITAMINB12", "FOLATE", "FERRITIN", "TIBC", "IRON", "RETICCTPCT" in the last 72 hours. Microbiology Recent Results (from the past 240 hour(s))  Resp panel by RT-PCR (RSV, Flu A&B, Covid) Anterior Nasal Swab     Status: None   Collection Time: 02/20/23  6:00 PM   Specimen: Anterior Nasal Swab  Result Value Ref Range Status   SARS Coronavirus 2 by RT PCR NEGATIVE NEGATIVE Final    Comment: (NOTE) SARS-CoV-2 target nucleic acids are NOT DETECTED.  The SARS-CoV-2 RNA is generally detectable  in upper respiratory specimens during the acute phase of infection. The lowest concentration of SARS-CoV-2 viral copies this assay can detect is 138 copies/mL. A negative result does not preclude SARS-Cov-2 infection and should not be used as the sole basis for treatment or other patient management decisions. A negative result may occur with  improper specimen collection/handling, submission of specimen other than nasopharyngeal swab, presence of viral mutation(s) within the areas targeted by this assay, and inadequate number of viral copies(<138 copies/mL). A negative result must be combined with clinical observations, patient history, and epidemiological information. The expected result is Negative.  Fact Sheet for Patients:  BloggerCourse.com  Fact Sheet for Healthcare Providers:  SeriousBroker.it  This test is no t yet approved or cleared by the [place] FDA and  has been authorized for detection and/or diagnosis of SARS-CoV-2 by FDA under an Emergency Use Authorization (EUA). This EUA will remain  in effect (meaning this test can be used) for the duration of the COVID-19 declaration under Section 564(b)(1) of the Act, 21 U.S.C.section 360bbb-3(b)(1), unless the authorization is terminated  or revoked sooner.       Influenza A by PCR NEGATIVE NEGATIVE Final   Influenza B by PCR NEGATIVE NEGATIVE Final    Comment: (NOTE) The Xpert Xpress SARS-CoV-2/FLU/RSV plus assay is intended as an aid in the diagnosis of influenza from Nasopharyngeal swab specimens and should not be used as a sole basis for treatment. Nasal washings and aspirates are unacceptable for Xpert Xpress SARS-CoV-2/FLU/RSV testing.  Fact Sheet for Patients: BloggerCourse.com  Fact Sheet for Healthcare Providers: SeriousBroker.it  This test is not yet approved or cleared by the [place] FDA and has  been authorized for detection and/or diagnosis of SARS-CoV-2 by FDA under an Emergency Use Authorization (EUA). This EUA will remain in effect (meaning this test can be used) for the duration of the COVID-19 declaration under Section 564(b)(1) of the Act, 21 U.S.C. section 360bbb-3(b)(1), unless the authorization is terminated or revoked.     Resp Syncytial Virus by PCR NEGATIVE NEGATIVE Final    Comment: (NOTE) Fact Sheet for Patients: BloggerCourse.com  Fact Sheet for Healthcare Providers: SeriousBroker.it  This test is not yet approved or cleared by the [place] FDA and has been authorized for detection and/or diagnosis of SARS-CoV-2 by FDA under an Emergency Use Authorization (EUA). This EUA will remain in effect (meaning this test can be used) for the duration of the COVID-19 declaration under Section 564(b)(1) of the Act, 21 U.S.C. section 360bbb-3(b)(1), unless the authorization is terminated or revoked.  Performed at [facility], [address], [place], [place] [address]   Blood Culture (routine x 2)     Status: None (Preliminary result)  Collection Time: 02/20/23  6:00 PM   Specimen: BLOOD  Result Value Ref Range Status   Specimen Description   Final    BLOOD RIGHT ANTECUBITAL Performed at [facility], [address] [place], [place] [address]    Special Requests   Final    BOTTLES DRAWN AEROBIC AND ANAEROBIC Blood Culture adequate volume Performed at [facility], [address], [place], [place] [address]    Culture   Final    NO GROWTH 2 DAYS Performed at [facility], [address], [place], [place] [address]    Report Status PENDING  Incomplete  Blood Culture (routine x 2)     Status: None (Preliminary result)   Collection Time: 02/20/23  6:00 PM   Specimen: BLOOD  Result Value Ref Range Status   Specimen Description   Final    BLOOD  LEFT ANTECUBITAL Performed at [facility], [address] [place], [place] [address]    Special Requests   Final    BOTTLES DRAWN AEROBIC ONLY Blood Culture adequate volume Performed at [facility], [address], [place], [place] [address]    Culture   Final    NO GROWTH 2 DAYS Performed at [facility], [address], [place], [place] [address]    Report Status PENDING  Incomplete      Studies:  No results found.  Assessment: 82 y.o. male with   Right forearm wound infection  Severe protein calorie malnutrition, failure to thrive at home Stage Ib pancreatic cancer, status post neoadjuvant chemotherapy, surgery, and adjuvant chemotherapy.  Local recurrence with radiation treatment in February 2024.  Currently on surveillance MDS, previous on Promacta, thrombocytopenia has resolved. Hypertension Deconditioning     Plan:  -I reviewed his recent abdominal MRI scan from March 14, 2023, which showed no definitive evidence of cancer progression or recurrence.  The quality of the scan was limited due to motion artifact.  If tumor marker CA 19.9 has been normal today.  I do not have strong suspicion of cancer progression, but it is very possible that that he has residual disease since he completed radiation.  Patient has clearly stated that he is not interested in cancer treatment if he has disease progression, which is very reasonable given his age and other medical comorbidities. -Not sure why he overall health condition deteriorated in the past months, possible to combination of aging, multiple previous cancer treatment, recent fall and wound depression, etc. -I encouraged him to participate occupational and physical therapy, the goal of therapy is to regain some his function and independence, he was previously very active, used to go to gym regularly.  He is very frustrated due to the loss of independence. -I also told pt and his family  that he will unlikely recover 100%, will see how much he can do and eat, the goal of therapy is manage palliative, hope you will recover some and have some quality of life.  If he does not recover well, certainly palliative care alone and even hospice is reasonable. -I encourage patient to consider going to a rehab, to see if he can regain some strength.  He will think about it. -I will f/u as needed. I will see him back after his discharge form SNF.    Malachy Mood, MD 02/22/2023  6:56 PM

## 2023-02-22 NOTE — TOC Initial Note (Addendum)
Transition of Care (TOC) - Initial/Assessment Note    Patient Details  Name: Angel FARIA Sr. MRN: [id number] Date of Birth: 10-02-1940  Transition of Care [facility]) CM/SW Contact:    [name], LCSW Phone Number: 02/22/2023, 1:29 PM  Clinical Narrative:                 Met with pt at bedside to discuss recommendation for SNF placement. Pt shares he has not been to SNF and currently does not have preference for facility.  Left voicemail for pt's daughter, Misty Stanley to provide update.  PASRR system currently down. Will work up for SNF placement when PASRR site is accessible.  Addendum: Spoke with pt's daughter who shares that their top choice for SNF is [facility]. PASRR requested. Currently at level 2 review. Additional records have been faxed in for review. Referrals have been sent out for placement.    Expected Discharge Plan: Skilled Nursing Facility Barriers to Discharge: Continued Medical Work up, SNF Pending bed offer   Patient Goals and CMS Choice Patient states their goals for this hospitalization and ongoing recovery are:: To go get back home CMS Medicare.gov Compare Post Acute Care list provided to:: Patient Choice offered to / list presented to : Patient [place] ownership interest in [facility].provided to:: Patient    Expected Discharge Plan and Services In-house Referral: Clinical Social Work Discharge Planning Services: NA Post Acute Care Choice: Skilled Nursing Facility Living arrangements for the past 2 months: Apartment                 DME Arranged: N/A DME Agency: NA                  Prior Living Arrangements/Services Living arrangements for the past 2 months: Apartment Lives with:: Self Patient language and need for interpreter reviewed:: Yes Do you feel safe going back to the place where you live?: Yes      Need for Family Participation in Patient Care: No (Comment) Care giver support system in place?: Yes (comment) Current home services:  Home PT Criminal Activity/Legal Involvement Pertinent to Current Situation/Hospitalization: No - Comment as needed  Activities of Daily Living Home Assistive Devices/Equipment: Walker (specify type), Eyeglasses ADL Screening (condition at time of admission) Patient's cognitive ability adequate to safely complete daily activities?: Yes Is the patient deaf or have difficulty hearing?: No Does the patient have difficulty seeing, even when wearing glasses/contacts?: No Does the patient have difficulty concentrating, remembering, or making decisions?: Yes Patient able to express need for assistance with ADLs?: Yes Does the patient have difficulty dressing or bathing?: Yes Independently performs ADLs?: Yes (appropriate for developmental age) Does the patient have difficulty walking or climbing stairs?: Yes Weakness of Legs: Both Weakness of Arms/Hands: Right  Permission Sought/Granted   Permission granted to share information with : Yes, Release of Information Signed  Share Information with NAME: [name]  Permission granted to share info w AGENCY: SNF's  Permission granted to share info w Relationship: Daughter  Permission granted to share info w Contact Information: [phone]  Emotional Assessment Appearance:: Appears stated age Attitude/Demeanor/Rapport: Engaged Affect (typically observed): Pleasant Orientation: : Oriented to Self, Oriented to Place, Oriented to  Time, Oriented to Situation Alcohol / Substance Use: Not Applicable Psych Involvement: No (comment)  Admission diagnosis:  Cellulitis of right upper extremity [L03.113] Generalized weakness [R53.1] Patient Active Problem List   Diagnosis Date Noted   Failure to thrive in adult 02/21/2023   Right forearm  cellulitis 02/21/2023   Generalized weakness 02/20/2023   Vertebral artery stenosis 02/18/2023   Pancreatic insufficiency 02/18/2023   Decreased appetite 01/06/2023   C. difficile colitis 12/20/2022   Atopic  dermatitis 12/10/2022   Acute left-sided weakness 09/16/2022   MDS (myelodysplastic syndrome) (HCC) 08/02/2022   Thrombocytopenia (HCC) 07/15/2022   Diarrhea due to malabsorption 02/03/2022   Aortic atherosclerosis (HCC) 11/10/2021   Dehydration 09/21/2021   Nausea with vomiting 09/21/2021   Superficial skin infection 09/19/2021   Lower extremity edema 09/12/2021   Family history of breast cancer 09/06/2021   Family history of lung cancer 09/06/2021   Protein-calorie malnutrition, severe 08/31/2021   SIRS (systemic inflammatory response syndrome) (HCC) 08/28/2021   Paroxysmal atrial fibrillation (HCC) 08/28/2021   Hypotension 08/28/2021   Hypokalemia 08/28/2021   Hyponatremia 08/28/2021   Fever 08/28/2021   Prolonged QT interval 08/28/2021   Abnormal weight loss 08/26/2021   Situational stress 08/26/2021   Port-A-Cath in place 08/20/2021   Local recurrence of pancreatic cancer (HCC) 08/14/2021   Nonspecific abnormal results of liver function study 08/09/2021   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   Nausea 08/09/2021   Iron deficiency anemia 08/09/2021   Abnormal INR 08/09/2021   Obstructive jaundice 07/18/2021   Acute non-recurrent pansinusitis 07/11/2021   Shortness of breath 07/11/2021   Acute respiratory failure due to COVID-19 [facility]) 07/27/2020   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 [place] [address] Phone:  [phone] Fax: [phone]     Social Determinants of Health (SDOH) Social History: SDOH Screenings   Food Insecurity: No Food Insecurity (02/21/2023)  Housing: Low Risk  (02/21/2023)  Transportation Needs: No Transportation Needs (02/21/2023)  Utilities: Not At Risk (02/21/2023)  Depression (PHQ2-9): Medium Risk (01/02/2023)  Financial Resource Strain: High Risk (03/10/2022)  Tobacco Use: Medium Risk (02/20/2023)   SDOH Interventions:     Readmission Risk Interventions    02/22/2023    1:24 PM 11/21/2021    1:06 PM  Readmission Risk Prevention Plan  Transportation Screening Complete Complete  Medication Review (RN Care Manager) Complete Complete  PCP or Specialist appointment within 3-5 days of discharge Complete Complete  HRI or Home Care Consult Complete Complete  SW Recovery Care/Counseling Consult Complete   Palliative Care Screening Not Applicable Not Applicable  Skilled Nursing Facility Complete Not Applicable

## 2023-02-22 NOTE — Progress Notes (Signed)
30 Day PASRR Note   Patient Details  Name: Angel SCHMUTZ Sr. Date of Birth: 24-Nov-1940   Transition of Care [facility]) CM/SW Contact:    [name], LCSW Phone Number: 02/22/2023, 3:20 PM  To Whom It May Concern:  Please be advised that this patient will require a short-term nursing home stay - anticipated 30 days or less for rehabilitation and strengthening.   The plan is for return home.

## 2023-02-22 NOTE — Progress Notes (Signed)
PMT no charge note.   Chart reviewed, briefly discussed with Dr. Robb Matar. Plan remains for skilled nursing facility rehabilitation attempt. Recommend addition of palliative services at [facility] rehab as well as outpatient follow-up with medical oncology. Recommend palliative follow-up with my colleague Ms. Cousar, NP at [facility] when the patient presents for follow-up with Dr. Parke Poisson from medical oncology. No new inpatient PMT specific recommendations at this time No charge Rosalin Hawking, MD [facility] health palliative

## 2023-02-22 NOTE — Progress Notes (Signed)
[facility] PROGRESS NOTE    Progress Note  Angel Keller  ZOX:[id number] DOB: 09-15-1940 DOA: 02/20/2023 PCP: Carlean Jews, NP     Brief Narrative:   Angel Drudge. is an 82 y.o. 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   Assessment/Plan:   Generalized weakness multifactorial likely due to dehydration, physical debility/ Failure to thrive in adult: PT OT recommended skilled nursing facility. We encourage oral intake Ensure 3 times daily. Anticipate skilled nursing facility placement.  Dehydration/severe protein caloric malnutrition: BMI of 16, severe muscle loss. Encourage oral protein intake. Ensure 3 times daily.  Right forearm wound possible purulent cellulitis: Has defervesced will de-escalate to oral doxycycline. Blood cultures have been obtained negative till date.  Prolonged QTc: Try to keep potassium greater than 4 magnesium greater than 2. Discontinue telemetry.  Hypothyroidism: Continue Synthroid.  Chronic anxiety/depression: Resume Lexapro.  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 Mosetta Putt medical oncology has been consulted.  He is not a candidate for chemotherapy. Oncology to see today. Palliative care spoke with the family they would like full code. Anticipate skilled nursing facility  Essential hypertension Blood pressures at goal continue to hold Norvasc.  GERD: Continue PPI.    DVT prophylaxis: lovenox Family Communication:none Status is: Inpatient Remains inpatient appropriate because: Cellulitis dehydration    Code Status:      Code Status Orders  (From admission, onward)           Start     Ordered   02/20/23 2045  Full code  Continuous       Question:  By:  Answer:  Consent: discussion documented in EHR   02/20/23 2044           Code Status History     Date Active Date Inactive Code Status Order ID Comments User Context   02/17/2023 2001 02/19/2023 2024 Full Code [id number]  Alan Mulder, MD ED   09/16/2022 1510 09/17/2022 2306 Full Code [id number]  Jonah Blue, MD ED   07/28/2022 1206 07/29/2022 0510 Full Code [id number]  Berdine Dance, MD HOV   11/15/2021 1533 11/22/2021 1746 Full Code [id number]  Fritzi Mandes, MD Inpatient   08/28/2021 0410 09/01/2021 0011 Full Code [id number]  Chotiner, Claudean Severance, MD ED   07/18/2021 1953 07/23/2021 2225 Full Code [id number]  Synetta Fail, MD ED   07/27/2020 1942 07/31/2020 1800 Full Code [id number]  Arnetha Courser, MD ED   10/21/2015 1501 10/22/2015 1846 Full Code [id number]  Julieanne Cotton, MD Inpatient         IV Access:   Peripheral IV   Procedures and diagnostic studies:   DG Chest Port 1 View  Result Date: 02/20/2023 CLINICAL DATA:  Sepsis. EXAM: PORTABLE CHEST 1 VIEW COMPARISON:  August 02, 2022. FINDINGS: The heart size and mediastinal contours are within normal limits. Both lungs are clear. Right internal jugular Port-A-Cath is unchanged. The visualized skeletal structures are unremarkable. IMPRESSION: No active disease. Electronically Signed   By: Lupita Raider M.D.   On: 02/20/2023 17:59     Medical Consultants:   None.   Subjective:    Angel Frederic Sr. appetite has improved  and has no new complaints.  Objective:    Vitals:   02/21/23 1453 02/21/23 2115 02/22/23 0500 02/22/23 0530  BP: 139/69 (!) 147/74  (!) 159/81  Pulse: 68 72  75  Resp: 16 16  16   Temp: 98.5 F (36.9 C) 98.4 F (36.9 C)  98.4 F (36.9 C)  TempSrc: Oral Oral  Oral  SpO2: 99% 100%  98%  Weight:   57.7 kg   Height:       SpO2: 98 %   Intake/Output  Summary (Last 24 hours) at 02/22/2023 0933 Last data filed at 02/22/2023 0600 Gross per 24 hour  Intake 740 ml  Output 875 ml  Net -135 ml    Filed Weights   02/20/23 1659 02/21/23 0102 02/22/23 0500  Weight: 60 kg 55.8 kg 57.7 kg    Exam: General exam: In no acute distress, cachectic Respiratory system: Good air movement and clear to auscultation. Cardiovascular system: S1 & S2 heard, RRR. No JVD. Gastrointestinal system: Abdomen is nondistended, soft and nontender.  Extremities: No pedal edema. Psychiatry: Judgement and insight appear normal. Mood & affect appropriate. Data Reviewed:    Labs: Basic Metabolic Panel: Recent Labs  Lab 02/17/23 1456 02/18/23 0500 02/20/23 1800 02/21/23 0546  NA 130* 131* 133* 135  K 3.4* 3.3* 4.0 3.6  CL 101 101 102 104  CO2 21* 23 22 20*  GLUCOSE 102* 93 108* 151*  BUN 30* 18 19 20   CREATININE 1.16 0.86 1.14 1.01  CALCIUM 8.2* 8.1* 8.3* 8.2*  MG  --  2.5* 2.2 2.3  PHOS  --   --   --  3.9    GFR Estimated Creatinine Clearance: 45.2 mL/min (by C-G formula based on SCr of 1.01 mg/dL). Liver Function Tests: Recent Labs  Lab 02/17/23 1456 02/20/23 1800 02/21/23 0546  AST 73* 40 38  ALT 53* 42 39  ALKPHOS 119 129* 123  BILITOT 0.3 0.3 0.3  PROT 7.2 6.9 6.5  ALBUMIN 2.9* 2.6* 2.4*    No results for input(s): "LIPASE", "AMYLASE" in the last 168 hours. No results for input(s): "AMMONIA" in the last 168 hours. Coagulation profile No results for input(s): "INR", "PROTIME" in the last 168 hours. COVID-19 Labs  No results for input(s): "DDIMER", "FERRITIN", "LDH", "CRP" in the last 72 hours.  Lab Results  Component Value Date   SARSCOV2NAA NEGATIVE 02/20/2023   SARSCOV2NAA NEGATIVE 08/27/2021   SARSCOV2NAA NEGATIVE 07/18/2021   SARSCOV2NAA POSITIVE (A) 07/27/2020    CBC: Recent Labs  Lab 02/17/23 1456 02/18/23 0500 02/20/23 1800 02/21/23 0546  WBC 7.7 7.8 11.1* 9.6  NEUTROABS  --   --  8.9* 7.6  HGB 7.7* 8.5* 8.8*  8.3*  HCT 23.8* 26.1* 27.3* 25.7*  MCV 80.7 80.8 81.5 81.6  PLT 209 233 249 276    Cardiac Enzymes: No results for input(s): "CKTOTAL", "CKMB", "CKMBINDEX", "TROPONINI" in the last 168 hours. BNP (last 3 results) No results for input(s): "PROBNP" in the last 8760 hours. CBG: No results for input(s): "GLUCAP" in the last 168 hours. D-Dimer: No results for input(s): "DDIMER" in the last 72 hours. Hgb A1c: No results for input(s): "HGBA1C" in the last 72 hours. Lipid Profile: No results for input(s): "CHOL", "HDL", "LDLCALC", "TRIG", "CHOLHDL", "LDLDIRECT" in the last 72 hours. Thyroid function studies: No results for input(s): "TSH", "T4TOTAL", "T3FREE", "THYROIDAB" in the last 72 hours.  Invalid input(s): "FREET3" Anemia work up: No results for input(s): "VITAMINB12", "FOLATE", "FERRITIN", "TIBC", "IRON", "RETICCTPCT" in the last 72  hours. Sepsis Labs: Recent Labs  Lab 02/17/23 1456 02/18/23 0500 02/20/23 1800 02/21/23 0546  WBC 7.7 7.8 11.1* 9.6  LATICACIDVEN  --   --  1.1  --     Microbiology Recent Results (from the past 240 hour(s))  Resp panel by RT-PCR (RSV, Flu A&B, Covid) Anterior Nasal Swab     Status: None   Collection Time: 02/20/23  6:00 PM   Specimen: Anterior Nasal Swab  Result Value Ref Range Status   SARS Coronavirus 2 by RT PCR NEGATIVE NEGATIVE Final    Comment: (NOTE) SARS-CoV-2 target nucleic acids are NOT DETECTED.  The SARS-CoV-2 RNA is generally detectable in upper respiratory specimens during the acute phase of infection. The lowest concentration of SARS-CoV-2 viral copies this assay can detect is 138 copies/mL. A negative result does not preclude SARS-Cov-2 infection and should not be used as the sole basis for treatment or other patient management decisions. A negative result may occur with  improper specimen collection/handling, submission of specimen other than nasopharyngeal swab, presence of viral mutation(s) within the areas targeted  by this assay, and inadequate number of viral copies(<138 copies/mL). A negative result must be combined with clinical observations, patient history, and epidemiological information. The expected result is Negative.  Fact Sheet for Patients:  BloggerCourse.com  Fact Sheet for Healthcare Providers:  SeriousBroker.it  This test is no t yet approved or cleared by the [place] FDA and  has been authorized for detection and/or diagnosis of SARS-CoV-2 by FDA under an Emergency Use Authorization (EUA). This EUA will remain  in effect (meaning this test can be used) for the duration of the COVID-19 declaration under Section 564(b)(1) of the Act, 21 U.S.C.section 360bbb-3(b)(1), unless the authorization is terminated  or revoked sooner.       Influenza A by PCR NEGATIVE NEGATIVE Final   Influenza B by PCR NEGATIVE NEGATIVE Final    Comment: (NOTE) The Xpert Xpress SARS-CoV-2/FLU/RSV plus assay is intended as an aid in the diagnosis of influenza from Nasopharyngeal swab specimens and should not be used as a sole basis for treatment. Nasal washings and aspirates are unacceptable for Xpert Xpress SARS-CoV-2/FLU/RSV testing.  Fact Sheet for Patients: BloggerCourse.com  Fact Sheet for Healthcare Providers: SeriousBroker.it  This test is not yet approved or cleared by the [place] FDA and has been authorized for detection and/or diagnosis of SARS-CoV-2 by FDA under an Emergency Use Authorization (EUA). This EUA will remain in effect (meaning this test can be used) for the duration of the COVID-19 declaration under Section 564(b)(1) of the Act, 21 U.S.C. section 360bbb-3(b)(1), unless the authorization is terminated or revoked.     Resp Syncytial Virus by PCR NEGATIVE NEGATIVE Final    Comment: (NOTE) Fact Sheet for Patients: BloggerCourse.com  Fact  Sheet for Healthcare Providers: SeriousBroker.it  This test is not yet approved or cleared by the [place] FDA and has been authorized for detection and/or diagnosis of SARS-CoV-2 by FDA under an Emergency Use Authorization (EUA). This EUA will remain in effect (meaning this test can be used) for the duration of the COVID-19 declaration under Section 564(b)(1) of the Act, 21 U.S.C. section 360bbb-3(b)(1), unless the authorization is terminated or revoked.  Performed at [facility], [address] [place], [place] [address]   Blood Culture (routine x 2)     Status: None (Preliminary result)   Collection Time: 02/20/23  6:00 PM   Specimen: BLOOD  Result Value Ref Range Status   Specimen  Description   Final    BLOOD RIGHT ANTECUBITAL Performed at [facility], [address], [place], [place] [address]    Special Requests   Final    BOTTLES DRAWN AEROBIC AND ANAEROBIC Blood Culture adequate volume Performed at [facility], [address], [place], [place] [address]    Culture   Final    NO GROWTH 2 DAYS Performed at [facility], [address], [place], [place] [address]    Report Status PENDING  Incomplete  Blood Culture (routine x 2)     Status: None (Preliminary result)   Collection Time: 02/20/23  6:00 PM   Specimen: BLOOD  Result Value Ref Range Status   Specimen Description   Final    BLOOD LEFT ANTECUBITAL Performed at [facility], [address] [place], [place] [address]    Special Requests   Final    BOTTLES DRAWN AEROBIC ONLY Blood Culture adequate volume Performed at [facility], [address] [place], [place] [address]    Culture   Final    NO GROWTH 2 DAYS Performed at [facility], [address], [place], [place] [address]    Report Status PENDING  Incomplete     Medications:    aspirin EC  81 mg Oral Daily    atorvastatin  40 mg Oral QHS   cyanocobalamin  1,000 mcg Oral BID   enoxaparin (LOVENOX) injection  40 mg Subcutaneous Q24H   escitalopram  20 mg Oral Daily   feeding supplement  237 mL Oral BID BM   levothyroxine  112 mcg Oral QAC breakfast   pantoprazole  40 mg Oral BID   Continuous Infusions:  cefTRIAXone (ROCEPHIN)  IV 2 g (02/21/23 1707)   vancomycin 750 mg (02/21/23 1752)      LOS: 2 days   Marinda Elk  [facility]  02/22/2023, 9:33 AM

## 2023-02-22 NOTE — NC FL2 (Signed)
[place] MEDICAID FL2 LEVEL OF CARE FORM     IDENTIFICATION  Patient Name: Angel Keller Birthdate: 1941-02-13 Sex: male Admission Date (Current Location): 02/20/2023  [facility] [place] Number:  Producer, television/film/video and Address:  [facility],  [address] [place], [place] [address]      Provider Number: [id number]  Attending Physician Name and Address:  Marinda Elk, MD  Relative Name and Phone Number:       Current Level of Care: Hospital Recommended Level of Care: Skilled Nursing Facility Prior Approval Number:    Date Approved/Denied:   PASRR Number: Pending  Discharge Plan: SNF    Current Diagnoses: Patient Active Problem List   Diagnosis Date Noted   Failure to thrive in adult 02/21/2023   Right forearm cellulitis 02/21/2023   Generalized weakness 02/20/2023   Vertebral artery stenosis 02/18/2023   Pancreatic insufficiency 02/18/2023   Decreased appetite 01/06/2023   C. 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   Thrombocytopenia (HCC) 07/15/2022   Diarrhea due to malabsorption 02/03/2022   Aortic atherosclerosis (HCC) 11/10/2021   Dehydration 09/21/2021   Nausea with vomiting 09/21/2021   Superficial skin infection 09/19/2021   Lower extremity edema 09/12/2021   Family history of breast cancer 09/06/2021   Family history of lung cancer 09/06/2021   Protein-calorie malnutrition, severe 08/31/2021   SIRS (systemic inflammatory response syndrome) (HCC) 08/28/2021   Paroxysmal atrial fibrillation (HCC) 08/28/2021   Hypotension 08/28/2021   Hypokalemia 08/28/2021   Hyponatremia 08/28/2021   Fever 08/28/2021   Prolonged QT interval 08/28/2021   Abnormal weight loss 08/26/2021   Situational stress 08/26/2021   Port-A-Cath in place 08/20/2021   Local recurrence of pancreatic cancer (HCC) 08/14/2021   Nonspecific abnormal  results of liver function study 08/09/2021   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   Nausea 08/09/2021   Iron deficiency anemia 08/09/2021   Abnormal INR 08/09/2021   Obstructive jaundice 07/18/2021   Acute non-recurrent pansinusitis 07/11/2021   Shortness of breath 07/11/2021   Acute respiratory failure due to COVID-19 (HCC) 07/27/2020   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   Acquired hypothyroidism 07/15/2009   FATIGUE 07/15/2009   SORE THROAT 06/11/2009   ALLERGIC RHINITIS 05/25/2009   HOARSENESS 05/25/2009   HYPERTHYROIDISM 11/07/2008   Essential hypertension 11/07/2008   DIVERTICULOSIS, COLON 11/07/2008   HLD (hyperlipidemia) 10/04/2007   Anxiety 10/04/2007   Depression 10/04/2007   GERD 10/04/2007   COLONIC POLYPS, HX OF 10/04/2007    Orientation RESPIRATION BLADDER Height & Weight     Self, Time, Situation, Place  Normal Incontinent, External catheter Weight: 127 lb 3.3 oz (57.7 kg) Height:  6' (182.9 cm)  BEHAVIORAL SYMPTOMS/MOOD NEUROLOGICAL BOWEL NUTRITION STATUS      Continent Diet (Regular)  AMBULATORY STATUS COMMUNICATION OF NEEDS Skin   Extensive Assist Verbally Skin abrasions                       Personal Care Assistance Level of Assistance  Bathing, Feeding, Dressing Bathing Assistance: Maximum assistance Feeding assistance: Limited assistance Dressing Assistance: Maximum assistance     Functional Limitations Info  Sight, Hearing, Speech Sight Info: Adequate Hearing Info: Impaired Speech Info: Adequate    SPECIAL CARE FACTORS FREQUENCY  PT (By licensed PT), OT (By licensed OT)     PT Frequency: 5x/wk OT Frequency: 5x/wk  Contractures Contractures Info: Not present    Additional Factors Info  Code Status, Allergies Code Status Info: FULL Allergies Info: NKA           Current Medications (02/22/2023):  This is the current hospital active medication list Current  Facility-Administered Medications  Medication Dose Route Frequency Provider Last Rate Last Admin   acetaminophen (TYLENOL) tablet 650 mg  650 mg Oral Q6H PRN Darlin Drop, DO   650 mg at 02/21/23 2104   aspirin EC tablet 81 mg  81 mg Oral Daily Darlin Drop, DO   81 mg at 02/22/23 0951   atorvastatin (LIPITOR) tablet 40 mg  40 mg Oral QHS Hall, Enid Derry N, DO   40 mg at 02/21/23 2104   cyanocobalamin (VITAMIN B12) tablet 1,000 mcg  1,000 mcg Oral BID Dow Adolph N, DO   1,000 mcg at 02/22/23 1610   doxycycline (VIBRA-TABS) tablet 100 mg  100 mg Oral Q12H Green, Terri L, RPH       enoxaparin (LOVENOX) injection 40 mg  40 mg Subcutaneous Q24H Hall, Carole N, DO   40 mg at 02/21/23 2104   escitalopram (LEXAPRO) tablet 20 mg  20 mg Oral Daily Dow Adolph N, DO   20 mg at 02/22/23 9604   feeding supplement (ENSURE ENLIVE / ENSURE PLUS) liquid 237 mL  237 mL Oral 3 times per day David Stall, Darin Engels, MD   237 mL at 02/22/23 1408   levothyroxine (SYNTHROID) tablet 112 mcg  112 mcg Oral QAC breakfast Darlin Drop, DO   112 mcg at 02/22/23 0600   melatonin tablet 5 mg  5 mg Oral QHS PRN Darlin Drop, DO       multivitamin with minerals tablet 1 tablet  1 tablet Oral Daily Marinda Elk, MD   1 tablet at 02/22/23 1407   pantoprazole (PROTONIX) EC tablet 40 mg  40 mg Oral BID Dow Adolph N, DO   40 mg at 02/22/23 0951   polyethylene glycol (MIRALAX / GLYCOLAX) packet 17 g  17 g Oral Daily PRN Dow Adolph N, DO       prochlorperazine (COMPAZINE) injection 5 mg  5 mg Intravenous Q6H PRN Darlin Drop, DO         Discharge Medications: Please see discharge summary for a list of discharge medications.  Relevant Imaging Results:  Relevant Lab Results:   Additional Information SSN: [id number]  [name], LCSW

## 2023-02-23 DIAGNOSIS — L03113 Cellulitis of right upper limb: Secondary | ICD-10-CM | POA: Diagnosis not present

## 2023-02-23 DIAGNOSIS — C259 Malignant neoplasm of pancreas, unspecified: Secondary | ICD-10-CM | POA: Diagnosis not present

## 2023-02-23 DIAGNOSIS — R627 Adult failure to thrive: Secondary | ICD-10-CM | POA: Diagnosis not present

## 2023-02-23 NOTE — Progress Notes (Signed)
Daily Progress Note   Patient Name: Angel MINTZER Sr.       Date: 02/23/2023 DOB: 1941/02/03  Age: 82 y.o. MRN#: [id number] Attending Physician: Marinda Elk, MD Primary Care Physician: Carlean Jews, NP Admit Date: 02/20/2023  Reason for Consultation/Follow-up: Establishing goals of care  Subjective: Awake alert, resting in bed, expresses frustration this morning, had a bowel movement, awaiting being cleaned up, recalls meeting with Dr Mosetta Putt on 02-22-23.   Length of Stay: 3  Current Medications: Scheduled Meds:   aspirin EC  81 mg Oral Daily   atorvastatin  40 mg Oral QHS   cyanocobalamin  1,000 mcg Oral BID   doxycycline  100 mg Oral Q12H   enoxaparin (LOVENOX) injection  40 mg Subcutaneous Q24H   escitalopram  20 mg Oral Daily   feeding supplement  237 mL Oral 3 times per day   levothyroxine  112 mcg Oral QAC breakfast   multivitamin with minerals  1 tablet Oral Daily   pantoprazole  40 mg Oral BID    Continuous Infusions:   PRN Meds: acetaminophen, melatonin, polyethylene glycol, prochlorperazine  Physical Exam         Appears with generalized weakness and cachectic appearance UE dressing Abdomen not distended No edema Non focal   Vital Signs: BP 137/78 (BP Location: Right Arm)   Pulse 63   Temp 98.9 F (37.2 C)   Resp 16   Ht 6' (1.829 m)   Wt 58.2 kg   SpO2 97%   BMI 17.39 kg/m  SpO2: SpO2: 97 % O2 Device: O2 Device: Room Air O2 Flow Rate:    Intake/output summary:  Intake/Output Summary (Last 24 hours) at 02/23/2023 1042 Last data filed at 02/23/2023 0500 Gross per 24 hour  Intake --  Output 1050 ml  Net -1050 ml   LBM: Last BM Date : 02/16/23 Baseline Weight: Weight: 60 kg Most recent weight: Weight: 58.2 kg       Palliative  Assessment/Data:      Patient Active Problem List   Diagnosis Date Noted   Failure to thrive in adult 02/21/2023   Right forearm cellulitis 02/21/2023   Generalized weakness 02/20/2023   Vertebral artery stenosis 02/18/2023   Pancreatic insufficiency 02/18/2023   Decreased appetite 01/06/2023   C. difficile colitis 12/20/2022   Atopic  dermatitis 12/10/2022   Acute left-sided weakness 09/16/2022   MDS (myelodysplastic syndrome) (HCC) 08/02/2022   Thrombocytopenia (HCC) 07/15/2022   Diarrhea due to malabsorption 02/03/2022   Aortic atherosclerosis (HCC) 11/10/2021   Dehydration 09/21/2021   Nausea with vomiting 09/21/2021   Superficial skin infection 09/19/2021   Lower extremity edema 09/12/2021   Family history of breast cancer 09/06/2021   Family history of lung cancer 09/06/2021   Protein-calorie malnutrition, severe 08/31/2021   SIRS (systemic inflammatory response syndrome) (HCC) 08/28/2021   Paroxysmal atrial fibrillation (HCC) 08/28/2021   Hypotension 08/28/2021   Hypokalemia 08/28/2021   Hyponatremia 08/28/2021   Fever 08/28/2021   Prolonged QT interval 08/28/2021   Abnormal weight loss 08/26/2021   Situational stress 08/26/2021   Port-A-Cath in place 08/20/2021   Local recurrence of pancreatic cancer (HCC) 08/14/2021   Nonspecific abnormal results of liver function study 08/09/2021   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   Nausea 08/09/2021   Iron deficiency anemia 08/09/2021   Abnormal INR 08/09/2021   Obstructive jaundice 07/18/2021   Acute non-recurrent pansinusitis 07/11/2021   Shortness of breath 07/11/2021   Acute respiratory failure due to COVID-19 [facility]) 07/27/2020   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  Local recurrence with radiation treatment in February 2024.  Currently on surveillance. Also with right forearm wound infection, severe protein calorie malnutrition failure to thrive at home.  Underlying MDS previously on Promacta, hypertension and deconditioning.  Recommendations/Plan: Overall plan remains for skilled nursing facility rehabilitation attempt so that the patient can attempt to regain some strength.  He is to follow-up with medical oncology in the outpatient setting.  Monitor PT OT attempts and p.o. intake.  Recommend outpatient palliative support.   Code Status:    Code Status Orders  (From admission, onward)           Start     Ordered   02/20/23 2045  Full code  Continuous       Question:  By:  Answer:  Consent: discussion documented in EHR   02/20/23 2044           Code Status History     Date Active Date Inactive Code Status Order ID Comments User Context   02/17/2023 2001 02/19/2023 2024 Full Code [id number]  Alan Mulder, MD ED   09/16/2022 1510 09/17/2022 2306 Full Code [id number]  Jonah Blue, MD ED   07/28/2022 1206 07/29/2022 0510 Full Code [id number]  Berdine Dance, MD HOV   11/15/2021 1533 11/22/2021 1746 Full Code [id number]  Fritzi Mandes, MD Inpatient   08/28/2021 0410 09/01/2021 0011 Full Code [id number]  Chotiner, Claudean Severance, MD ED   07/18/2021 1953 07/23/2021 2225 Full Code [id number]  Synetta Fail, MD ED   07/27/2020 1942 07/31/2020 1800 Full Code [id number]  Arnetha Courser, MD ED   10/21/2015 1501 10/22/2015 1846 Full Code [id number]  Julieanne Cotton, MD  Inpatient       Prognosis:  Unable to determine  Discharge Planning: Skilled Nursing Facility for rehab with Palliative care service follow-up  Care plan was discussed with patient.   Thank you for allowing the Palliative Medicine Team to assist in the care of this patient.  Low MDM.     Greater than 50%  of this time was spent counseling and coordinating care related to the above assessment and plan.  Rosalin Hawking, MD  Please contact Palliative Medicine Team phone at [phone] for questions and concerns.

## 2023-02-23 NOTE — Progress Notes (Signed)
[facility] PROGRESS NOTE    Progress Note  Angel Keller  ZOX:[id number] DOB: [birth year] DOA: 02/20/2023 PCP: Carlean Jews, NP     Brief Narrative:   Angel Keller. is an 82 y.o. 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   Assessment/Plan:   Generalized weakness multifactorial likely due to dehydration, physical debility/ Failure to thrive in adult: PT OT recommended skilled nursing facility. We encourage oral intake Ensure 3 times daily. Awaiting skilled nursing facility placement.  Dehydration/severe protein caloric malnutrition: BMI of 16, severe muscle loss. Encourage oral protein intake. Ensure 3 times daily.  Right forearm wound possible purulent cellulitis: Has defervesced will de-escalate to oral doxycycline. Blood cultures have been obtained negative till date.  Prolonged QTc: Try to keep potassium greater than 4 magnesium greater than 2. Discontinue telemetry.  Hypothyroidism: Continue Synthroid.  Chronic anxiety/depression: Resume Lexapro.  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 Mosetta Putt medical oncology has been consulted.  He is not a candidate for chemotherapy. Oncology to see today. Palliative care spoke with the family they would like full code. Anticipate skilled nursing facility  Essential hypertension Blood pressures at goal continue to hold Norvasc.  GERD: Continue PPI.    DVT prophylaxis: lovenox Family Communication:none Status is: Inpatient Remains inpatient appropriate because: Cellulitis dehydration    Code Status:      Code Status Orders  (From admission, onward)           Start     Ordered   02/20/23 2045  Full code  Continuous       Question:  By:  Answer:  Consent: discussion documented in EHR   02/20/23 2044           Code Status History     Date Active Date Inactive Code Status Order ID Comments User Context   02/17/2023 2001 02/19/2023 2024 Full Code [id number]  Alan Mulder, MD ED   09/16/2022 1510 09/17/2022 2306 Full Code [id number]  Jonah Blue, MD ED   07/28/2022 1206 07/29/2022 0510 Full Code [id number]  Berdine Dance, MD HOV   11/15/2021 1533 11/22/2021 1746 Full Code [id number]  Fritzi Mandes, MD Inpatient   08/28/2021 0410 09/01/2021 0011 Full Code [id number]  Chotiner, Claudean Severance, MD ED   07/18/2021 1953 07/23/2021 2225 Full Code [id number]  Synetta Fail, MD ED   07/27/2020 1942 07/31/2020 1800 Full Code [id number]  Arnetha Courser, MD ED   10/21/2015 1501 10/22/2015 1846 Full Code [id number]  Julieanne Cotton, MD Inpatient         IV Access:   Peripheral IV   Procedures and diagnostic studies:   No results found.   Medical Consultants:   None.   Subjective:    Samson Frederic Sr. no new complaints.  Objective:    Vitals:   02/22/23 1718 02/22/23 1914 02/23/23 0513 02/23/23 0543  BP: (!) 149/81 (!) 147/77 137/78   Pulse: 79 73 63   Resp: (!) 21 15 16    Temp: 99.1 F (37.3 C) 99 F (37.2 C) 98.9 F (37.2 C)   TempSrc: Oral Oral    SpO2: 97% 97% 97%   Weight:  58.2 kg  Height:       SpO2: 97 %   Intake/Output Summary (Last 24 hours) at 02/23/2023 1027 Last data filed at 02/23/2023 0500 Gross per 24 hour  Intake --  Output 1050 ml  Net -1050 ml   Filed Weights   02/21/23 0102 02/22/23 0500 02/23/23 0543  Weight: 55.8 kg 57.7 kg 58.2 kg    Exam: General exam: In no acute distress, cachectic Respiratory system: Good air movement and clear to auscultation. Cardiovascular system: S1 & S2 heard, RRR. No JVD. Gastrointestinal system: Abdomen is  nondistended, soft and nontender.  Extremities: No pedal edema. Psychiatry: Judgement and insight appear normal. Mood & affect appropriate. Data Reviewed:    Labs: Basic Metabolic Panel: Recent Labs  Lab 02/17/23 1456 02/18/23 0500 02/20/23 1800 02/21/23 0546  NA 130* 131* 133* 135  K 3.4* 3.3* 4.0 3.6  CL 101 101 102 104  CO2 21* 23 22 20*  GLUCOSE 102* 93 108* 151*  BUN 30* 18 19 20   CREATININE 1.16 0.86 1.14 1.01  CALCIUM 8.2* 8.1* 8.3* 8.2*  MG  --  2.5* 2.2 2.3  PHOS  --   --   --  3.9   GFR Estimated Creatinine Clearance: 45.6 mL/min (by C-G formula based on SCr of 1.01 mg/dL). Liver Function Tests: Recent Labs  Lab 02/17/23 1456 02/20/23 1800 02/21/23 0546  AST 73* 40 38  ALT 53* 42 39  ALKPHOS 119 129* 123  BILITOT 0.3 0.3 0.3  PROT 7.2 6.9 6.5  ALBUMIN 2.9* 2.6* 2.4*   No results for input(s): "LIPASE", "AMYLASE" in the last 168 hours. No results for input(s): "AMMONIA" in the last 168 hours. Coagulation profile No results for input(s): "INR", "PROTIME" in the last 168 hours. COVID-19 Labs  No results for input(s): "DDIMER", "FERRITIN", "LDH", "CRP" in the last 72 hours.  Lab Results  Component Value Date   SARSCOV2NAA NEGATIVE 02/20/2023   SARSCOV2NAA NEGATIVE 08/27/2021   SARSCOV2NAA NEGATIVE 07/18/2021   SARSCOV2NAA POSITIVE (A) 07/27/2020    CBC: Recent Labs  Lab 02/17/23 1456 02/18/23 0500 02/20/23 1800 02/21/23 0546  WBC 7.7 7.8 11.1* 9.6  NEUTROABS  --   --  8.9* 7.6  HGB 7.7* 8.5* 8.8* 8.3*  HCT 23.8* 26.1* 27.3* 25.7*  MCV 80.7 80.8 81.5 81.6  PLT 209 233 249 276   Cardiac Enzymes: No results for input(s): "CKTOTAL", "CKMB", "CKMBINDEX", "TROPONINI" in the last 168 hours. BNP (last 3 results) No results for input(s): "PROBNP" in the last 8760 hours. CBG: No results for input(s): "GLUCAP" in the last 168 hours. D-Dimer: No results for input(s): "DDIMER" in the last 72 hours. Hgb A1c: No results for input(s): "HGBA1C" in  the last 72 hours. Lipid Profile: No results for input(s): "CHOL", "HDL", "LDLCALC", "TRIG", "CHOLHDL", "LDLDIRECT" in the last 72 hours. Thyroid function studies: No results for input(s): "TSH", "T4TOTAL", "T3FREE", "THYROIDAB" in the last 72 hours.  Invalid input(s): "FREET3" Anemia work up: No results for input(s): "VITAMINB12", "FOLATE", "FERRITIN", "TIBC", "IRON", "RETICCTPCT" in the last 72 hours. Sepsis Labs: Recent Labs  Lab 02/17/23 1456 02/18/23 0500 02/20/23 1800 02/21/23 0546  WBC 7.7 7.8 11.1* 9.6  LATICACIDVEN  --   --  1.1  --    Microbiology Recent Results (from the past 240 hour(s))  Resp panel by RT-PCR (RSV, Flu A&B, Covid) Anterior Nasal Swab     Status: None   Collection Time: 02/20/23  6:00 PM   Specimen: Anterior Nasal Swab  Result Value  Ref Range Status   SARS Coronavirus 2 by RT PCR NEGATIVE NEGATIVE Final    Comment: (NOTE) SARS-CoV-2 target nucleic acids are NOT DETECTED.  The SARS-CoV-2 RNA is generally detectable in upper respiratory specimens during the acute phase of infection. The lowest concentration of SARS-CoV-2 viral copies this assay can detect is 138 copies/mL. A negative result does not preclude SARS-Cov-2 infection and should not be used as the sole basis for treatment or other patient management decisions. A negative result may occur with  improper specimen collection/handling, submission of specimen other than nasopharyngeal swab, presence of viral mutation(s) within the areas targeted by this assay, and inadequate number of viral copies(<138 copies/mL). A negative result must be combined with clinical observations, patient history, and epidemiological information. The expected result is Negative.  Fact Sheet for Patients:  BloggerCourse.com  Fact Sheet for Healthcare Providers:  SeriousBroker.it  This test is no t yet approved or cleared by the [place] FDA and  has been  authorized for detection and/or diagnosis of SARS-CoV-2 by FDA under an Emergency Use Authorization (EUA). This EUA will remain  in effect (meaning this test can be used) for the duration of the COVID-19 declaration under Section 564(b)(1) of the Act, 21 U.S.C.section 360bbb-3(b)(1), unless the authorization is terminated  or revoked sooner.       Influenza A by PCR NEGATIVE NEGATIVE Final   Influenza B by PCR NEGATIVE NEGATIVE Final    Comment: (NOTE) The Xpert Xpress SARS-CoV-2/FLU/RSV plus assay is intended as an aid in the diagnosis of influenza from Nasopharyngeal swab specimens and should not be used as a sole basis for treatment. Nasal washings and aspirates are unacceptable for Xpert Xpress SARS-CoV-2/FLU/RSV testing.  Fact Sheet for Patients: BloggerCourse.com  Fact Sheet for Healthcare Providers: SeriousBroker.it  This test is not yet approved or cleared by the [place] FDA and has been authorized for detection and/or diagnosis of SARS-CoV-2 by FDA under an Emergency Use Authorization (EUA). This EUA will remain in effect (meaning this test can be used) for the duration of the COVID-19 declaration under Section 564(b)(1) of the Act, 21 U.S.C. section 360bbb-3(b)(1), unless the authorization is terminated or revoked.     Resp Syncytial Virus by PCR NEGATIVE NEGATIVE Final    Comment: (NOTE) Fact Sheet for Patients: BloggerCourse.com  Fact Sheet for Healthcare Providers: SeriousBroker.it  This test is not yet approved or cleared by the [place] FDA and has been authorized for detection and/or diagnosis of SARS-CoV-2 by FDA under an Emergency Use Authorization (EUA). This EUA will remain in effect (meaning this test can be used) for the duration of the COVID-19 declaration under Section 564(b)(1) of the Act, 21 U.S.C. section 360bbb-3(b)(1), unless the  authorization is terminated or revoked.  Performed at [facility], [address] [place], [place] [address]   Blood Culture (routine x 2)     Status: None (Preliminary result)   Collection Time: 02/20/23  6:00 PM   Specimen: BLOOD  Result Value Ref Range Status   Specimen Description   Final    BLOOD RIGHT ANTECUBITAL Performed at [facility], [address], [place], [place] [address]    Special Requests   Final    BOTTLES DRAWN AEROBIC AND ANAEROBIC Blood Culture adequate volume Performed at [facility], [address], [place], [place] [address]    Culture   Final    NO GROWTH 3 DAYS Performed at [facility], [address], [place], [place] [address]    Report Status PENDING  Incomplete  Blood Culture (routine x 2)     Status: None (Preliminary result)   Collection Time: 02/20/23  6:00 PM   Specimen: BLOOD  Result Value Ref Range Status   Specimen Description   Final    BLOOD LEFT ANTECUBITAL Performed at [facility], [address], [place], [place] [address]    Special Requests   Final    BOTTLES DRAWN AEROBIC ONLY Blood Culture adequate volume Performed at [facility], [address] [place], [place] [address]    Culture   Final    NO GROWTH 3 DAYS Performed at [facility], [address] [place], [place] [address]    Report Status PENDING  Incomplete     Medications:    aspirin EC  81 mg Oral Daily   atorvastatin  40 mg Oral QHS   cyanocobalamin  1,000 mcg Oral BID   doxycycline  100 mg Oral Q12H   enoxaparin (LOVENOX) injection  40 mg Subcutaneous Q24H   escitalopram  20 mg Oral Daily   feeding supplement  237 mL Oral 3 times per day   levothyroxine  112 mcg Oral QAC breakfast   multivitamin with minerals  1 tablet Oral Daily   pantoprazole  40 mg Oral BID   Continuous Infusions:      LOS: 3 days   Marinda Elk  [facility]  02/23/2023, 10:27 AM

## 2023-02-23 NOTE — Plan of Care (Signed)

## 2023-02-23 NOTE — Care Management Important Message (Signed)
Important Message  Patient Details IM Letter given. Name: Angel Keller Sr. MRN: [id number] Date of Birth: August 05, 1941   Medicare Important Message Given:  Yes     Caren Macadam 02/23/2023, 12:31 PM

## 2023-02-23 NOTE — Progress Notes (Signed)
Physical Therapy Treatment Patient Details Name: Angel FRIEDE Sr. MRN: [id number] DOB: 1940/12/13 Today's Date: 02/23/2023   History of Present Illness Patient is a 82 year old male who presented on 7/8 with poor oral intake, and drainage from right forearm. Patient was admitted with dehydration, failure to thrive, severe protein calorie malnutrition and right forearm wound with purulence.  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    PT Comments  Pt assisted with standing and marching in place.  Pt then able to take a few steps forwards and backwards.  Pt required at least mod assist for stabilizing. Pt assisted to [facility] for BM and left call bell within reach of pt.  RN also aware pt on BSC.  Pt appears disgruntled about decreased mobility as well as needing assist for pericare/BMs ("it's just embarrassing").  Continue to recommend inpatient follow up therapy, <3 hours/day.     Assistance Recommended at Discharge Intermittent Supervision/Assistance  If plan is discharge home, recommend the following:  Can travel by private vehicle    A lot of help with walking and/or transfers;Assistance with cooking/housework;Assist for transportation;Help with stairs or ramp for entrance   No  Equipment Recommendations  None recommended by PT    Recommendations for Other Services       Precautions / Restrictions Precautions Precautions: Fall Precaution Comments: R shoulder and upper arm with skin tears from fall     Mobility  Bed Mobility Overal bed mobility: Needs Assistance Bed Mobility: Supine to Sit     Supine to sit: Min assist     General bed mobility comments: assist for trunk upright    Transfers Overall transfer level: Needs  assistance Equipment used: Rolling walker (2 wheels) Transfers: Sit to/from Stand Sit to Stand: Mod assist   Step pivot transfers: Min assist       General transfer comment: verbal cues for positioning and weight shifting, assist to rise and steady, posterior bias upon standing    Ambulation/Gait Ambulation/Gait assistance: Min assist, Mod assist Gait Distance (Feet): 4 Feet Assistive device: Rolling walker (2 wheels) Gait Pattern/deviations: Step-through pattern, Decreased stride length, Trunk flexed       General Gait Details: verbal cues for BOS, assist for stabilizing. took a few steps forwards and then backwards, pt then requested to use BSC which was provided   Stairs             Wheelchair Mobility     Tilt Bed    Modified Rankin (Stroke Patients Only)       Balance Overall balance assessment: History of Falls, Needs assistance         Standing balance support: Reliant on assistive device for balance, Bilateral upper extremity supported Standing balance-Leahy Scale: Poor Standing balance comment: posterior leaning needing physical A to maintain balance.                            Cognition Arousal/Alertness: Awake/alert Behavior During Therapy: WFL for tasks assessed/performed Overall Cognitive Status: Within Functional Limits for tasks assessed                                          Exercises  General Comments        Pertinent Vitals/Pain Pain Assessment Pain Assessment: Faces Faces Pain Scale: Hurts little more Pain Location: right shoulder Pain Descriptors / Indicators: Sore, Guarding Pain Intervention(s): Monitored during session, Repositioned    Home Living                          Prior Function            PT Goals (current goals can now be found in the care plan section) Progress towards PT goals: Progressing toward goals    Frequency    Min 1X/week      PT Plan  Current plan remains appropriate    Co-evaluation              AM-PAC PT "6 Clicks" Mobility   Outcome Measure  Help needed turning from your back to your side while in a flat bed without using bedrails?: A Little Help needed moving from lying on your back to sitting on the side of a flat bed without using bedrails?: A Lot Help needed moving to and from a bed to a chair (including a wheelchair)?: A Lot Help needed standing up from a chair using your arms (e.g., wheelchair or bedside chair)?: A Lot Help needed to walk in hospital room?: A Lot Help needed climbing 3-5 steps with a railing? : Total 6 Click Score: 12    End of Session Equipment Utilized During Treatment: Gait belt Activity Tolerance: Patient limited by fatigue Patient left: with call bell/phone within reach (On BSC per request, RN aware, pt agreeable to use call bell and wait for assist) Nurse Communication: Mobility status PT Visit Diagnosis: Difficulty in walking, not elsewhere classified (R26.2);Muscle weakness (generalized) (M62.81);Unsteadiness on feet (R26.81)     Time: 1610-9604 PT Time Calculation (min) (ACUTE ONLY): 20 min  Charges:    $Gait Training: 8-22 mins PT General Charges $$ ACUTE PT VISIT: 1 Visit                     Angel Keller, DPT Physical Therapist Acute Rehabilitation Services Office: [phone]    Angel Keller 02/23/2023, 4:04 PM

## 2023-02-23 NOTE — TOC Progression Note (Signed)
Transition of Care (TOC) - Progression Note    Patient Details  Name: Angel DANN Sr. MRN: [id number] Date of Birth: Mar 01, 1941  Transition of Care [facility]) CM/SW Contact  [name], LCSW Phone Number: 02/23/2023, 10:31 AM  Clinical Narrative:    Pt's PASRR returned: [phone] [id number]  Spoke with pt's daughter to review bed offers. Pt's daughter requested additional referrals be sent to SNF's in [place] as this would be closer for family than offers that were made in [place]. Referrals made to additional facilities.   Update 1:45: Currently no bed offers from additional facilities. CSW left voicemail with pt's daughter to discuss bed choice.     Expected Discharge Plan: Skilled Nursing Facility Barriers to Discharge: Continued Medical Work up, SNF Pending bed offer  Expected Discharge Plan and Services In-house Referral: Clinical Social Work Discharge Planning Services: NA Post Acute Care Choice: Skilled Nursing Facility Living arrangements for the past 2 months: Apartment                 DME Arranged: N/A DME Agency: NA                   Social Determinants of Health (SDOH) Interventions SDOH Screenings   Food Insecurity: No Food Insecurity (02/21/2023)  Housing: Low Risk  (02/21/2023)  Transportation Needs: No Transportation Needs (02/21/2023)  Utilities: Not At Risk (02/21/2023)  Depression (PHQ2-9): Medium Risk (01/02/2023)  Financial Resource Strain: High Risk (03/10/2022)  Tobacco Use: Medium Risk (02/20/2023)    Readmission Risk Interventions    02/22/2023    1:24 PM 11/21/2021    1:06 PM  Readmission Risk Prevention Plan  Transportation Screening Complete Complete  Medication Review (RN Care Manager) Complete Complete  PCP or Specialist appointment within 3-5 days of discharge Complete Complete  HRI or Home Care Consult Complete Complete  SW Recovery Care/Counseling Consult Complete   Palliative Care Screening Not Applicable Not Applicable  Skilled  Nursing Facility Complete Not Applicable

## 2023-02-24 DIAGNOSIS — M6281 Muscle weakness (generalized): Secondary | ICD-10-CM | POA: Diagnosis not present

## 2023-02-24 DIAGNOSIS — I639 Cerebral infarction, unspecified: Secondary | ICD-10-CM | POA: Diagnosis not present

## 2023-02-24 DIAGNOSIS — D469 Myelodysplastic syndrome, unspecified: Secondary | ICD-10-CM | POA: Diagnosis not present

## 2023-02-24 DIAGNOSIS — I10 Essential (primary) hypertension: Secondary | ICD-10-CM | POA: Diagnosis not present

## 2023-02-24 DIAGNOSIS — Z7401 Bed confinement status: Secondary | ICD-10-CM | POA: Diagnosis not present

## 2023-02-24 DIAGNOSIS — C257 Malignant neoplasm of other parts of pancreas: Secondary | ICD-10-CM | POA: Diagnosis not present

## 2023-02-24 DIAGNOSIS — C259 Malignant neoplasm of pancreas, unspecified: Secondary | ICD-10-CM | POA: Diagnosis not present

## 2023-02-24 DIAGNOSIS — L03113 Cellulitis of right upper limb: Secondary | ICD-10-CM | POA: Diagnosis not present

## 2023-02-24 DIAGNOSIS — G459 Transient cerebral ischemic attack, unspecified: Secondary | ICD-10-CM | POA: Diagnosis not present

## 2023-02-24 DIAGNOSIS — R531 Weakness: Secondary | ICD-10-CM | POA: Diagnosis not present

## 2023-02-24 DIAGNOSIS — E43 Unspecified severe protein-calorie malnutrition: Secondary | ICD-10-CM | POA: Diagnosis not present

## 2023-02-24 DIAGNOSIS — R627 Adult failure to thrive: Secondary | ICD-10-CM | POA: Diagnosis not present

## 2023-02-24 DIAGNOSIS — I959 Hypotension, unspecified: Secondary | ICD-10-CM | POA: Diagnosis not present

## 2023-02-24 DIAGNOSIS — U071 COVID-19: Secondary | ICD-10-CM | POA: Diagnosis not present

## 2023-02-24 DIAGNOSIS — F411 Generalized anxiety disorder: Secondary | ICD-10-CM | POA: Diagnosis not present

## 2023-02-24 DIAGNOSIS — R5381 Other malaise: Secondary | ICD-10-CM | POA: Diagnosis not present

## 2023-02-24 DIAGNOSIS — E039 Hypothyroidism, unspecified: Secondary | ICD-10-CM | POA: Diagnosis not present

## 2023-02-24 DIAGNOSIS — E119 Type 2 diabetes mellitus without complications: Secondary | ICD-10-CM | POA: Diagnosis not present

## 2023-02-24 DIAGNOSIS — E7849 Other hyperlipidemia: Secondary | ICD-10-CM | POA: Diagnosis not present

## 2023-02-24 DIAGNOSIS — E559 Vitamin D deficiency, unspecified: Secondary | ICD-10-CM | POA: Diagnosis not present

## 2023-02-24 LAB — BLOOD CULTURE ID PANEL (REFLEXED) - BCID2

## 2023-02-24 LAB — CULTURE, BLOOD (ROUTINE X 2)
Culture: NO GROWTH
Special Requests: ADEQUATE

## 2023-02-24 MED ORDER — PSYLLIUM 95 % PO PACK
1.0000 | PACK | Freq: Every day | ORAL | Status: DC
  Administered 2023-02-24: 1 via ORAL
  Filled 2023-02-24: qty 1

## 2023-02-24 MED ORDER — PANCRELIPASE (LIP-PROT-AMYL) 36000-114000 UNITS PO CPEP
36000.0000 [IU] | ORAL_CAPSULE | Freq: Three times a day (TID) | ORAL | Status: DC
  Administered 2023-02-24: 36000 [IU] via ORAL
  Filled 2023-02-24 (×2): qty 1

## 2023-02-24 MED ORDER — DOXYCYCLINE HYCLATE 100 MG PO TABS: 100.0000 mg | ORAL_TABLET | Freq: Two times a day (BID) | ORAL | Status: AC

## 2023-02-24 NOTE — Plan of Care (Signed)

## 2023-02-24 NOTE — Progress Notes (Signed)
Occupational Therapy Treatment Patient Details Name: Angel PEBLEY Sr. MRN: [id number] DOB: [birth year] Today's Date: 02/24/2023   History of present illness Patient is a 82 year old male who presented on 7/8 with poor oral intake, and drainage from right forearm. Patient was admitted with dehydration, failure to thrive, severe protein calorie malnutrition and right forearm wound with purulence.  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   OT comments  Pt in bed on bedpan upon therapy arrival and reports that he is finished. Session focused on UB strength and endurance while participating in bed mobility to allow for toilet hygiene. Pt reports that he is leaving today for rehab. Feels frustrated by not being able to care for himself. Education provided on goals of therapy and what to expect at next venue of care. Pt perseverating on waiting for the nurse to come and change his "bandages." OT will continue to follow patient acutely.   Recommendations for follow up therapy are one component of a multi-disciplinary discharge planning process, led by the attending physician.  Recommendations may be updated based on patient status, additional functional criteria and insurance authorization.    Assistance Recommended at Discharge Frequent or constant Supervision/Assistance  Patient can return home with the following  A lot of help with bathing/dressing/bathroom;Assistance with cooking/housework;Direct supervision/assist for medications management;Assist for transportation;Help with stairs or ramp for entrance;Direct supervision/assist for financial management;A lot of help with walking and/or transfers   Equipment Recommendations  Other (comment) (defer to next venue of  care)       Precautions / Restrictions Precautions Precautions: Fall Precaution Comments: R shoulder and upper arm with skin tears from fall Restrictions Weight Bearing Restrictions: No       Mobility Bed Mobility Overal bed mobility: Needs Assistance Bed Mobility: Rolling Rolling: Supervision         General bed mobility comments: Utilized bed rails to roll right and left in bed during toilet hygiene.           ADL either performed or assessed with clinical judgement   ADL     Toileting- Clothing Manipulation and Hygiene: Maximal assistance;Bed level Toileting - Clothing Manipulation Details (indicate cue type and reason): Assisted pt off bedpan and completing hygiene                       Cognition Arousal/Alertness: Awake/alert Behavior During Therapy: WFL for tasks assessed/performed Overall Cognitive Status: Within Functional Limits for tasks assessed                                Pertinent Vitals/ Pain       Pain Assessment Pain Assessment: Faces Faces Pain Scale: No hurt         Frequency  Min 1X/week        Progress Toward Goals  OT Goals(current goals can now be found in the care plan section)  Progress towards OT goals: Progressing toward goals     Plan Discharge plan remains appropriate;Frequency remains appropriate       AM-PAC OT "6 Clicks" Daily Activity     Outcome Measure   Help from another person eating meals?: A Little Help from another person taking care of personal grooming?: A Little Help from another person  toileting, which includes using toliet, bedpan, or urinal?: A Lot Help from another person bathing (including washing, rinsing, drying)?: A Lot Help from another person to put on and taking off regular upper body clothing?: A Little Help from another person to put on and taking off regular lower body clothing?: A Lot 6 Click Score: 15    End of Session    OT Visit Diagnosis: Unsteadiness on  feet (R26.81);Other abnormalities of gait and mobility (R26.89)   Activity Tolerance Patient tolerated treatment well   Patient Left in bed;with call bell/phone within reach;with bed alarm set;with family/visitor present           Time: 6045-4098 OT Time Calculation (min): 16 min  Charges: OT General Charges $OT Visit: 1 Visit OT Treatments $Self Care/Home Management : 8-22 mins  Limmie Patricia, OTR/L,CBIS  Supplemental OT - [facility] and [facility] Secure Chat Preferred    Olegario Emberson, Charisse March 02/24/2023, 5:31 PM

## 2023-02-24 NOTE — TOC Transition Note (Addendum)
Transition of Care [facility]) - CM/SW Discharge Note   Patient Details  Name: Angel SAFFLE Sr. MRN: [id number] Date of Birth: [birth year]  Transition [facility] Care [facility]) CM/SW Contact:  [name], LCSW Phone Number: 02/24/2023, 11:34 AM   Clinical Narrative:    Pt accepted bed offer for [facility]. Pt's insurance auth approved from 7/12 to 7/16. Auth ID: [id number]. Pt will be going to room 3213. Call report to [phone]. Pt's daughter to transport pt to facility. Pt is able to arrive to facility after 1pm.  Spoke with pt to discuss recommendation for outpatient palliative care to continue to follow at SNF/home. CSW explained benefits of having palliative care following. Pt declined for CSW to make referral. TOC encouraged pt to reach out to CSW should he change his mind.  ADDENDUM: [facility] called at 3:45pm to transport pt to SNF so, that daughter can go to facility to sign paperwork prior to 4pm.    Final next level of care: Skilled Nursing Facility Barriers to Discharge: Barriers Resolved   Patient Goals and CMS Choice CMS Medicare.gov Compare Post Acute Care list provided to:: Patient Choice offered to / list presented to : Patient  Discharge Placement PASRR number recieved: 02/23/23 PASRR number recieved: 02/23/23            Patient chooses bed at: [facility] Patient to be transferred to facility by: Daughter Name of family member notified: [name] Patient and family notified of of transfer: 02/24/23  Discharge Plan and Services Additional resources added to the After Visit Summary for   In-house Referral: Clinical Social Work Discharge Planning Services: NA Post Acute Care Choice: Skilled Nursing Facility          DME Arranged: N/A DME Agency: NA                  Social Determinants of Health (SDOH) Interventions SDOH Screenings   Food Insecurity: No Food Insecurity (02/21/2023)  Housing: Low Risk  (02/21/2023)  Transportation Needs: No  Transportation Needs (02/21/2023)  Utilities: Not At Risk (02/21/2023)  Depression (PHQ2-9): Medium Risk (01/02/2023)  Financial Resource Strain: High Risk (03/10/2022)  Tobacco Use: Medium Risk (02/20/2023)     Readmission Risk Interventions    02/24/2023   11:31 AM 02/22/2023    1:24 PM 11/21/2021    1:06 PM  Readmission Risk Prevention Plan  Transportation Screening Complete Complete Complete  PCP or Specialist Appt within 3-5 Days Complete    HRI or Home Care Consult Complete    Social Work Consult for Recovery Care Planning/Counseling Complete    Palliative Care Screening Complete    Medication Review Oceanographer) Complete Complete Complete  PCP or Specialist appointment within 3-5 days of discharge  Complete Complete  HRI or Home Care Consult  Complete Complete  SW Recovery Care/Counseling Consult  Complete   Palliative Care Screening  Not Applicable Not Applicable  Skilled Nursing Facility  Complete Not Applicable

## 2023-02-24 NOTE — Discharge Summary (Signed)
Physician Discharge Summary  Angel Keller ZOX:[id number] DOB: 1941/04/25 DOA: 02/20/2023  PCP: Carlean Jews, NP  Admit date: 02/20/2023 Discharge date: 02/24/2023  Admitted From: Home Disposition:  SNF  Recommendations for Outpatient Follow-up:  Follow up with PCP in 1-2 weeks Please obtain BMP/CBC in one week   Home Health:No Equipment/Devices:None  Discharge Condition:Stable CODE STATUS:Full Diet recommendation: Heart Healthy   Brief/Interim Summary: 82 y.o. 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   Discharge Diagnoses:  Principal Problem:   Failure to thrive in adult Active Problems:   Local recurrence of pancreatic cancer [facility])   Essential hypertension   Dehydration   Generalized weakness   Right forearm cellulitis  Generalized weakness multifactorial in the setting of dehydration physical debility and failure to thrive: PT evaluated the patient recommended skilled nursing facility.  Dehydration/severe protein caloric malnutrition: With a BMI of 16 severe muscle loss. We encourage protein intake.  Possible right forearm cellulitis: He was started empirically on antibiotics he defervesced blood cultures were obtained which were negative. He will continue oral Doxy as an outpatient.  Prolonged QTc: Likely due to electrolyte imbalance, we try to keep the potassium greater than 4 magnesium greater than 2.  Hypothyroidism: Continue Synthroid.  Chronic anxiety/depression: Resume Lexapro.  Possible local recurrence of pancreatic cancer/middle dysp sick syndrome: Dr. Marshall Cork oncology was consulted she  relates she is not a candidate for chemotherapy. Plan of care met with patient and family decided they wanted full code and aggressive measures. Physical therapy evaluated the patient recommended skilled nursing facility.  Essential hypertension: Discontinue Norvasc his blood pressure has remained stable. His blood pressure has been less than 160/100 will monitor off Norvasc it could be resumed as an outpatient if needed.  GERD: Noted.  Discharge Instructions  Discharge Instructions     Diet - low sodium heart healthy   Complete by: As directed    Increase activity slowly   Complete by: As directed    No wound care   Complete by: As directed       Allergies as of 02/24/2023   No Known Allergies      Medication List     STOP taking these medications    megestrol 625 MG/5ML suspension Commonly known as: MEGACE ES   pantoprazole 40 MG tablet Commonly known as: PROTONIX       TAKE these medications    acetaminophen 500 MG tablet Commonly known as: TYLENOL Take 1,000 mg by mouth as needed for headache.   amLODipine 5 MG tablet Commonly known as: NORVASC TAKE 1 TABLET (5 MG TOTAL) BY MOUTH DAILY.   aspirin EC 81 MG tablet Take 1 tablet (81 mg total) by mouth daily. Swallow whole.   atorvastatin 40 MG tablet Commonly known as: LIPITOR TAKE 1 TABLET (40 MG TOTAL) BY MOUTH AT BEDTIME.   doxycycline 100 MG tablet Commonly known as: VIBRA-TABS Take 1 tablet (100 mg total) by mouth every 12 (twelve) hours for 3 days.   escitalopram 20 MG tablet Commonly known as: LEXAPRO Take 1 tablet (20 mg total) by mouth daily.   feeding supplement Liqd Take 237 mLs by mouth 2 (two) times daily between meals.  What changed:  when to take this additional instructions   levothyroxine 112 MCG tablet Commonly known as: SYNTHROID Take 1 tablet (112 mcg total) by mouth daily before breakfast.   lidocaine-prilocaine cream Commonly known as: EMLA Apply topically to  affected area(s) as needed. What changed: reasons to take this   lipase/protease/amylase [address] UNITS Cpep capsule Commonly known as: CREON Take 1 capsule (36,000 Units total) by mouth 3 (three) times daily before meals.   mirtazapine 7.5 MG tablet Commonly known as: REMERON Take 1 tablet (7.5 mg total) by mouth at bedtime.   Promacta 50 MG tablet Generic drug: eltrombopag Take 1 tablet (50 mg total) by mouth daily. Take on an empty stomach 1 hour before a meal or 2 hours after        No Known Allergies  Consultations: Oncology Palliative care   Procedures/Studies: DG Chest Port 1 View  Result Date: 02/20/2023 CLINICAL DATA:  Sepsis. EXAM: PORTABLE CHEST 1 VIEW COMPARISON:  August 02, 2022. FINDINGS: The heart size and mediastinal contours are within normal limits. Both lungs are clear. Right internal jugular Port-A-Cath is unchanged. The visualized skeletal structures are unremarkable. IMPRESSION: No active disease. Electronically Signed   By: Lupita Raider M.D.   On: 02/20/2023 17:59   CT ANGIO HEAD NECK W WO CM  Result Date: 02/17/2023 CLINICAL DATA:  Vertigo, central EXAM: CT ANGIOGRAPHY HEAD AND NECK WITH AND WITHOUT CONTRAST TECHNIQUE: Multidetector CT imaging of the head and neck was performed using the standard protocol during bolus administration of intravenous contrast. Multiplanar CT image reconstructions and MIPs were obtained to evaluate the vascular anatomy. Carotid stenosis measurements (when applicable) are obtained utilizing NASCET criteria, using the distal internal carotid diameter as the denominator. RADIATION DOSE REDUCTION: This exam was performed according to the departmental dose-optimization program which includes automated exposure control, adjustment of the mA and/or kV according to patient size and/or use of iterative reconstruction technique. CONTRAST:  75mL OMNIPAQUE IOHEXOL 350 MG/ML SOLN COMPARISON:  MRA September 16, 2022. FINDINGS: CT HEAD FINDINGS  Brain: No evidence of acute large vascular territory infarction, hemorrhage, hydrocephalus, extra-axial collection or mass lesion/mass effect. Remote right PCA territory infarct. Vascular: See below. Skull: No acute fracture. Sinuses/Orbits: Largely clear sinuses.  No acute orbital findings. Other: No mastoid effusions. Review of the MIP images confirms the above findings CTA NECK FINDINGS Aortic arch: Aortic atherosclerosis. Visualized great vessel origins are patent without significant stenosis. Right carotid system: Atherosclerosis involving the carotid bifurcation and proximal ICA with approximately 40% stenosis. Left carotid system: Atherosclerosis at the carotid bifurcation without greater than 50% stenosis. Vertebral arteries: Both vertebral arteries are patent. 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 (Series 13, image 116). Skeleton: Multilevel severe degenerative change including degenerative disc disease and facet/uncovertebral hypertrophy with varying degrees of neural foraminal stenosis. Other neck: No acute abnormality on limited assessment. Upper chest: Lung apices are clear. Review of the MIP images confirms the above findings CTA HEAD FINDINGS Anterior circulation: Bilateral intracranial ICAs, MCAs, and ACAs are patent. 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 The stent appears patent. Posterior circulation: Bilateral intradural vertebral arteries are patent with mild atherosclerotic narrowing. Basilar artery and bilateral posterior cerebral arteries are patent without proximal hemodynamically significant stenosis. Venous sinuses: As permitted by contrast timing, patent. Review of the MIP images confirms  the above findings IMPRESSION: 1. No emergent large vessel occlusion. 2. Moderate to severe stenosis of  the left V2 vertebral artery at the C4-C5 level due to mass effect from osteophytes. 3. Approximately 40% stenosis of the proximal right ICA in the neck. 4. When comparing across modalities, similar versus slightly decreased size of a 2-3 mm inferiorly MRA September 16, 2022. 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 The stent appears patent. Electronically Signed   By: Feliberto Harts M.D.   On: 02/17/2023 17:03   DG Elbow Complete Right  Result Date: 02/17/2023 CLINICAL DATA:  Right arm pain after falling. Patient had a skin tear with bleeding on the posterior aspect of his right elbow. EXAM: RIGHT ELBOW - COMPLETE 3+ VIEW COMPARISON:  None Available. FINDINGS: There is diffuse decreased bone mineralization. Mild-to-moderate medial and lateral elbow joint space narrowing, greatest at the medial aspect of the trochlea-coronoid articulation. Mild radial head-neck junction degenerative spurring, greatest at the articulation with the radial notch of the ulna. Mild chronic enthesopathic change at the common flexor tendon origin at the medial epicondyle greater than the common extensor tendon origin at the lateral epicondyle. There is soft tissue attenuation, irregularity, and mild subcutaneous air at the posterior and medial aspects of the elbow consistent with reported wound. No acute fracture or dislocation. IMPRESSION: Soft tissue attenuation, irregularity, and mild subcutaneous air at the posterior and medial aspects of the elbow consistent with the reported wound. 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 No acute fracture. Electronically Signed   By: Neita Garnet M.D.   On: 02/17/2023 15:05   DG Shoulder Right  Result Date: 02/17/2023 CLINICAL DATA:  Dizziness and fall. Recurrent falls recently. Head laceration, bleeding controlled. Skin tears to right arm - right anterior skin tears on right shoulder -Hx of right rotator cuff surgery EXAM: RIGHT SHOULDER - 2+  VIEW COMPARISON:  None available FINDINGS: No fracture or dislocation. Soft tissues are unremarkable. Moderate degenerative changes of the glenohumeral and acromioclavicular joints. Prominent subacromial spur is present. Irregularity of the greater tuberosity consistent with rotator cuff tendinopathy. Right chest port is seen terminating in the region of the upper SVC. IMPRESSION: No acute abnormality of the right shoulder. Degenerative changes as discussed above. Electronically Signed   By: Acquanetta Belling M.D.   On: 02/17/2023 14:18   MR Abdomen W Wo Contrast  Result Date: 02/15/2023 CLINICAL DATA:  Pancreatic cancer EXAM: MRI ABDOMEN WITHOUT AND WITH CONTRAST TECHNIQUE: Multiplanar multisequence MR imaging of the abdomen was performed both before and after the administration of intravenous contrast. CONTRAST:  5mL GADAVIST GADOBUTROL 1 MMOL/ML IV SOLN COMPARISON:  CT chest abdomen pelvis, 12/15/2022, 08/16/2022 PET-CT, 07/28/2023 FINDINGS: Examination is significantly limited by breath motion artifact, which degrades essentially all sequences, particularly multiphasic contrast enhanced sequences. Lower chest: No acute abnormality. Hepatobiliary: Hyperemia of the left lobe of the liver, likely secondary to radiation port. Status post cholecystectomy and hepatojejunostomy. Unchanged postoperative pneumobilia. Pancreas: Status post Whipple pancreaticoduodenectomy, in general poorly assessed secondary to breath motion artifact. No obvious soft tissue or abnormality of the pancreaticojejunostomy. Mildly atrophic pancreas. No pancreatic ductal dilatation. Spleen: Normal in size without significant abnormality. Adrenals/Urinary Tract: Adrenal glands are unremarkable. Kidneys are normal, without renal calculi, solid lesion, or hydronephrosis. Stomach/Bowel: Status post distal gastrectomy and gastrojejunostomy. No evidence of bowel wall thickening, distention, or inflammatory changes. Vascular/Lymphatic: No significant  vascular findings are present. A previously described soft tissue nodule or lymph node interposed between the aorta,  portal vein, left renal vein, and superior mesenteric artery is not clearly visualized on today's examination (series 14, image 45). No definitely enlarged abdominal lymph nodes. Other: No abdominal wall hernia or abnormality. No ascites. Musculoskeletal: No acute or significant osseous findings. IMPRESSION: 1. Examination is significantly limited by breath motion artifact, which degrades essentially all sequences, particularly multiphasic contrast enhanced sequences. 2. Status post Whipple pancreaticoduodenectomy, in general poorly assessed secondary to breath motion artifact. No obvious soft tissue or abnormality of the pancreaticojejunostomy. 3. A previously described soft tissue nodule or lymph node interposed between the aorta, portal vein, left renal vein, and superior mesenteric artery is not clearly visualized on today's examination, possibly resolved in response to treatment. No definitely enlarged abdominal lymph nodes. 4. No evidence of recurrent or metastatic disease in the abdomen. Electronically Signed   By: Jearld Lesch M.D.   On: 02/15/2023 16:14   MR 3D Recon At Scanner  Result Date: 02/15/2023 CLINICAL DATA:  Pancreatic cancer EXAM: MRI ABDOMEN WITHOUT AND WITH CONTRAST TECHNIQUE: Multiplanar multisequence MR imaging of the abdomen was performed both before and after the administration of intravenous contrast. CONTRAST:  5mL GADAVIST GADOBUTROL 1 MMOL/ML IV SOLN COMPARISON:  CT chest abdomen pelvis, 12/15/2022, 08/16/2022 PET-CT, 07/28/2023 FINDINGS: Examination is significantly limited by breath motion artifact, which degrades essentially all sequences, particularly multiphasic contrast enhanced sequences. Lower chest: No acute abnormality. Hepatobiliary: Hyperemia of the left lobe of the liver, likely secondary to radiation port. Status post cholecystectomy and  hepatojejunostomy. Unchanged postoperative pneumobilia. Pancreas: Status post Whipple pancreaticoduodenectomy, in general poorly assessed secondary to breath motion artifact. No obvious soft tissue or abnormality of the pancreaticojejunostomy. Mildly atrophic pancreas. No pancreatic ductal dilatation. Spleen: Normal in size without significant abnormality. Adrenals/Urinary Tract: Adrenal glands are unremarkable. Kidneys are normal, without renal calculi, solid lesion, or hydronephrosis. Stomach/Bowel: Status post distal gastrectomy and gastrojejunostomy. No evidence of bowel wall thickening, distention, or inflammatory changes. Vascular/Lymphatic: No significant vascular findings are present. A previously described soft tissue nodule or lymph node interposed between the aorta, portal vein, left renal vein, and superior mesenteric artery is not clearly visualized on today's examination (series 14, image 45). No definitely enlarged abdominal lymph nodes. Other: No abdominal wall hernia or abnormality. No ascites. Musculoskeletal: No acute or significant osseous findings. IMPRESSION: 1. Examination is significantly limited by breath motion artifact, which degrades essentially all sequences, particularly multiphasic contrast enhanced sequences. 2. Status post Whipple pancreaticoduodenectomy, in general poorly assessed secondary to breath motion artifact. No obvious soft tissue or abnormality of the pancreaticojejunostomy. 3. A previously described soft tissue nodule or lymph node interposed between the aorta, portal vein, left renal vein, and superior mesenteric artery is not clearly visualized on today's examination, possibly resolved in response to treatment. No definitely enlarged abdominal lymph nodes. 4. No evidence of recurrent or metastatic disease in the abdomen. Electronically Signed   By: Jearld Lesch M.D.   On: 02/15/2023 16:14   (Echo, Carotid, EGD, Colonoscopy, ERCP)    Subjective: No  complaints  Discharge Exam: Vitals:   02/23/23 2035 02/24/23 0514  BP: (!) 152/77 (!) 155/85  Pulse: 71 72  Resp: 16 16  Temp: 98 F (36.7 C) 98 F (36.7 C)  SpO2: 98% 96%   Vitals:   02/23/23 0543 02/23/23 1241 02/23/23 2035 02/24/23 0514  BP:  126/64 (!) 152/77 (!) 155/85  Pulse:  70 71 72  Resp:  18 16 16   Temp:  98.2 F (36.8 C) 98 F (36.7 C) 98 F (  36.7 C)  TempSrc:  Oral Oral Oral  SpO2:  99% 98% 96%  Weight: 58.2 kg     Height:        General: Pt is alert, awake, not in acute distress Cardiovascular: RRR, S1/S2 +, no rubs, no gallops Respiratory: CTA bilaterally, no wheezing, no rhonchi Abdominal: Soft, NT, ND, bowel sounds + Extremities: no edema, no cyanosis    The results of significant diagnostics from this hospitalization (including imaging, microbiology, ancillary and laboratory) are listed below for reference.     Microbiology: Recent Results (from the past 240 hour(s))  Resp panel by RT-PCR (RSV, Flu A&B, Covid) Anterior Nasal Swab     Status: None   Collection Time: 02/20/23  6:00 PM   Specimen: Anterior Nasal Swab  Result Value Ref Range Status   SARS Coronavirus 2 by RT PCR NEGATIVE NEGATIVE Final    Comment: (NOTE) SARS-CoV-2 target nucleic acids are NOT DETECTED.  The SARS-CoV-2 RNA is generally detectable in upper respiratory specimens during the acute phase of infection. The lowest concentration of SARS-CoV-2 viral copies this assay can detect is 138 copies/mL. A negative result does not preclude SARS-Cov-2 infection and should not be used as the sole basis for treatment or other patient management decisions. A negative result may occur with  improper specimen collection/handling, submission of specimen other than nasopharyngeal swab, presence of viral mutation(s) within the areas targeted by this assay, and inadequate number of viral copies(<138 copies/mL). A negative result must be combined with clinical observations, patient  history, and epidemiological information. The expected result is Negative.  Fact Sheet for Patients:  BloggerCourse.com  Fact Sheet for Healthcare Providers:  SeriousBroker.it  This test is no t yet approved or cleared by the [place] FDA and  has been authorized for detection and/or diagnosis of SARS-CoV-2 by FDA under an Emergency Use Authorization (EUA). This EUA will remain  in effect (meaning this test can be used) for the duration of the COVID-19 declaration under Section 564(b)(1) of the Act, 21 U.S.C.section 360bbb-3(b)(1), unless the authorization is terminated  or revoked sooner.       Influenza A by PCR NEGATIVE NEGATIVE Final   Influenza B by PCR NEGATIVE NEGATIVE Final    Comment: (NOTE) The Xpert Xpress SARS-CoV-2/FLU/RSV plus assay is intended as an aid in the diagnosis of influenza from Nasopharyngeal swab specimens and should not be used as a sole basis for treatment. Nasal washings and aspirates are unacceptable for Xpert Xpress SARS-CoV-2/FLU/RSV testing.  Fact Sheet for Patients: BloggerCourse.com  Fact Sheet for Healthcare Providers: SeriousBroker.it  This test is not yet approved or cleared by the [place] FDA and has been authorized for detection and/or diagnosis of SARS-CoV-2 by FDA under an Emergency Use Authorization (EUA). This EUA will remain in effect (meaning this test can be used) for the duration of the COVID-19 declaration under Section 564(b)(1) of the Act, 21 U.S.C. section 360bbb-3(b)(1), unless the authorization is terminated or revoked.     Resp Syncytial Virus by PCR NEGATIVE NEGATIVE Final    Comment: (NOTE) Fact Sheet for Patients: BloggerCourse.com  Fact Sheet for Healthcare Providers: SeriousBroker.it  This test is not yet approved or cleared by the [place] FDA  and has been authorized for detection and/or diagnosis of SARS-CoV-2 by FDA under an Emergency Use Authorization (EUA). This EUA will remain in effect (meaning this test can be used) for the duration of the COVID-19 declaration under Section 564(b)(1) of the Act, 21 U.S.C. section 360bbb-3(b)(1), unless  the authorization is terminated or revoked.  Performed at [facility], [address], [place], [place] [address]   Blood Culture (routine x 2)     Status: None (Preliminary result)   Collection Time: 02/20/23  6:00 PM   Specimen: BLOOD  Result Value Ref Range Status   Specimen Description   Final    BLOOD RIGHT ANTECUBITAL Performed at [facility], [address] [place], [place] [address]    Special Requests   Final    BOTTLES DRAWN AEROBIC AND ANAEROBIC Blood Culture adequate volume Performed at [facility], [address], [place], [place] [address]    Culture  Setup Time   Final    GRAM POSITIVE COCCI ANAEROBIC BOTTLE ONLY CRITICAL RESULT CALLED TO, READ BACK BY AND VERIFIED WITH: PHARMD E. JACKSON 02/24/23 @ 0326 BY AB Performed at [facility], [address] [place], [place] [address]    Culture GRAM POSITIVE COCCI  Final   Report Status PENDING  Incomplete  Blood Culture (routine x 2)     Status: None (Preliminary result)   Collection Time: 02/20/23  6:00 PM   Specimen: BLOOD  Result Value Ref Range Status   Specimen Description   Final    BLOOD LEFT ANTECUBITAL Performed at [facility], [address], [place], [place] [address]    Special Requests   Final    BOTTLES DRAWN AEROBIC ONLY Blood Culture adequate volume Performed at [facility], [address], [place], [place] [address]    Culture   Final    NO GROWTH 4 DAYS Performed at [facility], [address], [place], [place] [address]    Report Status PENDING  Incomplete  Blood Culture ID  Panel (Reflexed)     Status: Abnormal   Collection Time: 02/20/23  6:00 PM  Result Value Ref Range Status   Enterococcus faecalis NOT DETECTED NOT DETECTED Final   Enterococcus Faecium NOT DETECTED NOT DETECTED Final   Listeria monocytogenes NOT DETECTED NOT DETECTED Final   Staphylococcus species NOT DETECTED NOT DETECTED Final   Staphylococcus aureus (BCID) NOT DETECTED NOT DETECTED Final   Staphylococcus epidermidis NOT DETECTED NOT DETECTED Final   Staphylococcus lugdunensis NOT DETECTED NOT DETECTED Final   Streptococcus species DETECTED (A) NOT DETECTED Final    Comment: Not Enterococcus species, Streptococcus agalactiae, Streptococcus pyogenes, or Streptococcus pneumoniae. CRITICAL RESULT CALLED TO, READ BACK BY AND VERIFIED WITH: PHARMD E. JACKSON 02/24/23 @ 0326 BY AB    Streptococcus agalactiae NOT DETECTED NOT DETECTED Final   Streptococcus pneumoniae NOT DETECTED NOT DETECTED Final   Streptococcus pyogenes NOT DETECTED NOT DETECTED Final   A.calcoaceticus-baumannii NOT DETECTED NOT DETECTED Final   Bacteroides fragilis NOT DETECTED NOT DETECTED Final   Enterobacterales NOT DETECTED NOT DETECTED Final   Enterobacter cloacae complex NOT DETECTED NOT DETECTED Final   Escherichia coli NOT DETECTED NOT DETECTED Final   Klebsiella aerogenes NOT DETECTED NOT DETECTED Final   Klebsiella oxytoca NOT DETECTED NOT DETECTED Final   Klebsiella pneumoniae NOT DETECTED NOT DETECTED Final   Proteus species NOT DETECTED NOT DETECTED Final   Salmonella species NOT DETECTED NOT DETECTED Final   Serratia marcescens NOT DETECTED NOT DETECTED Final   Haemophilus influenzae NOT DETECTED NOT DETECTED Final   Neisseria meningitidis NOT DETECTED NOT DETECTED Final   Pseudomonas aeruginosa NOT DETECTED NOT DETECTED Final   Stenotrophomonas maltophilia NOT DETECTED NOT DETECTED Final   Candida  albicans NOT DETECTED NOT DETECTED Final   Candida auris NOT DETECTED NOT DETECTED Final   Candida  glabrata NOT DETECTED NOT DETECTED Final   Candida krusei NOT DETECTED NOT DETECTED Final   Candida parapsilosis NOT DETECTED NOT DETECTED Final   Candida tropicalis NOT DETECTED NOT DETECTED Final   Cryptococcus neoformans/gattii NOT DETECTED NOT DETECTED Final    Comment: Performed at [facility], [address], [place], [place] [address]     Labs: BNP (last 3 results) Recent Labs    02/20/23 1800  BNP 106.0*   Basic Metabolic Panel: Recent Labs  Lab 02/17/23 1456 02/18/23 0500 02/20/23 1800 02/21/23 0546  NA 130* 131* 133* 135  K 3.4* 3.3* 4.0 3.6  CL 101 101 102 104  CO2 21* 23 22 20*  GLUCOSE 102* 93 108* 151*  BUN 30* 18 19 20   CREATININE 1.16 0.86 1.14 1.01  CALCIUM 8.2* 8.1* 8.3* 8.2*  MG  --  2.5* 2.2 2.3  PHOS  --   --   --  3.9   Liver Function Tests: Recent Labs  Lab 02/17/23 1456 02/20/23 1800 02/21/23 0546  AST 73* 40 38  ALT 53* 42 39  ALKPHOS 119 129* 123  BILITOT 0.3 0.3 0.3  PROT 7.2 6.9 6.5  ALBUMIN 2.9* 2.6* 2.4*   No results for input(s): "LIPASE", "AMYLASE" in the last 168 hours. No results for input(s): "AMMONIA" in the last 168 hours. CBC: Recent Labs  Lab 02/17/23 1456 02/18/23 0500 02/20/23 1800 02/21/23 0546  WBC 7.7 7.8 11.1* 9.6  NEUTROABS  --   --  8.9* 7.6  HGB 7.7* 8.5* 8.8* 8.3*  HCT 23.8* 26.1* 27.3* 25.7*  MCV 80.7 80.8 81.5 81.6  PLT 209 233 249 276   Cardiac Enzymes: No results for input(s): "CKTOTAL", "CKMB", "CKMBINDEX", "TROPONINI" in the last 168 hours. BNP: Invalid input(s): "POCBNP" CBG: No results for input(s): "GLUCAP" in the last 168 hours. D-Dimer No results for input(s): "DDIMER" in the last 72 hours. Hgb A1c No results for input(s): "HGBA1C" in the last 72 hours. Lipid Profile No results for input(s): "CHOL", "HDL", "LDLCALC", "TRIG", "CHOLHDL", "LDLDIRECT" in the last 72 hours. Thyroid function studies No results for input(s): "TSH", "T4TOTAL", "T3FREE", "THYROIDAB" in the last 72  hours.  Invalid input(s): "FREET3" Anemia work up No results for input(s): "VITAMINB12", "FOLATE", "FERRITIN", "TIBC", "IRON", "RETICCTPCT" in the last 72 hours. Urinalysis    Component Value Date/Time   COLORURINE YELLOW 02/20/2023 2023   APPEARANCEUR CLEAR 02/20/2023 2023   LABSPEC 1.009 02/20/2023 2023   PHURINE 5.0 02/20/2023 2023   GLUCOSEU NEGATIVE 02/20/2023 2023   GLUCOSEU NEGATIVE 11/07/2008 0000   HGBUR SMALL (A) 02/20/2023 2023   BILIRUBINUR NEGATIVE 02/20/2023 2023   KETONESUR NEGATIVE 02/20/2023 2023   PROTEINUR NEGATIVE 02/20/2023 2023   UROBILINOGEN 1.0 07/09/2009 2019   NITRITE NEGATIVE 02/20/2023 2023   LEUKOCYTESUR NEGATIVE 02/20/2023 2023   Sepsis Labs Recent Labs  Lab 02/17/23 1456 02/18/23 0500 02/20/23 1800 02/21/23 0546  WBC 7.7 7.8 11.1* 9.6   Microbiology Recent Results (from the past 240 hour(s))  Resp panel by RT-PCR (RSV, Flu A&B, Covid) Anterior Nasal Swab     Status: None   Collection Time: 02/20/23  6:00 PM   Specimen: Anterior Nasal Swab  Result Value Ref Range Status   SARS Coronavirus 2 by RT PCR NEGATIVE NEGATIVE Final    Comment: (NOTE) SARS-CoV-2 target nucleic acids are NOT DETECTED.  The SARS-CoV-2 RNA is generally detectable  in upper respiratory specimens during the acute phase of infection. The lowest concentration of SARS-CoV-2 viral copies this assay can detect is 138 copies/mL. A negative result does not preclude SARS-Cov-2 infection and should not be used as the sole basis for treatment or other patient management decisions. A negative result may occur with  improper specimen collection/handling, submission of specimen other than nasopharyngeal swab, presence of viral mutation(s) within the areas targeted by this assay, and inadequate number of viral copies(<138 copies/mL). A negative result must be combined with clinical observations, patient history, and epidemiological information. The expected result is  Negative.  Fact Sheet for Patients:  BloggerCourse.com  Fact Sheet for Healthcare Providers:  SeriousBroker.it  This test is no t yet approved or cleared by the [place] FDA and  has been authorized for detection and/or diagnosis of SARS-CoV-2 by FDA under an Emergency Use Authorization (EUA). This EUA will remain  in effect (meaning this test can be used) for the duration of the COVID-19 declaration under Section 564(b)(1) of the Act, 21 U.S.C.section 360bbb-3(b)(1), unless the authorization is terminated  or revoked sooner.       Influenza A by PCR NEGATIVE NEGATIVE Final   Influenza B by PCR NEGATIVE NEGATIVE Final    Comment: (NOTE) The Xpert Xpress SARS-CoV-2/FLU/RSV plus assay is intended as an aid in the diagnosis of influenza from Nasopharyngeal swab specimens and should not be used as a sole basis for treatment. Nasal washings and aspirates are unacceptable for Xpert Xpress SARS-CoV-2/FLU/RSV testing.  Fact Sheet for Patients: BloggerCourse.com  Fact Sheet for Healthcare Providers: SeriousBroker.it  This test is not yet approved or cleared by the [place] FDA and has been authorized for detection and/or diagnosis of SARS-CoV-2 by FDA under an Emergency Use Authorization (EUA). This EUA will remain in effect (meaning this test can be used) for the duration of the COVID-19 declaration under Section 564(b)(1) of the Act, 21 U.S.C. section 360bbb-3(b)(1), unless the authorization is terminated or revoked.     Resp Syncytial Virus by PCR NEGATIVE NEGATIVE Final    Comment: (NOTE) Fact Sheet for Patients: BloggerCourse.com  Fact Sheet for Healthcare Providers: SeriousBroker.it  This test is not yet approved or cleared by the [place] FDA and has been authorized for detection and/or diagnosis of  SARS-CoV-2 by FDA under an Emergency Use Authorization (EUA). This EUA will remain in effect (meaning this test can be used) for the duration of the COVID-19 declaration under Section 564(b)(1) of the Act, 21 U.S.C. section 360bbb-3(b)(1), unless the authorization is terminated or revoked.  Performed at [facility], [address] [place], [place] [address]   Blood Culture (routine x 2)     Status: None (Preliminary result)   Collection Time: 02/20/23  6:00 PM   Specimen: BLOOD  Result Value Ref Range Status   Specimen Description   Final    BLOOD RIGHT ANTECUBITAL Performed at [facility], [address], [place], [place] [address]    Special Requests   Final    BOTTLES DRAWN AEROBIC AND ANAEROBIC Blood Culture adequate volume Performed at [facility], [address], [place], [place] [address]    Culture  Setup Time   Final    GRAM POSITIVE COCCI ANAEROBIC BOTTLE ONLY CRITICAL RESULT CALLED TO, READ BACK BY AND VERIFIED WITH: PHARMD E. JACKSON 02/24/23 @ 0326 BY AB Performed at [facility], [address] [place], [place] [address]    Culture GRAM POSITIVE  COCCI  Final   Report Status PENDING  Incomplete  Blood Culture (routine x 2)     Status: None (Preliminary result)   Collection Time: 02/20/23  6:00 PM   Specimen: BLOOD  Result Value Ref Range Status   Specimen Description   Final    BLOOD LEFT ANTECUBITAL Performed at [facility], [address] [place], [place] [address]    Special Requests   Final    BOTTLES DRAWN AEROBIC ONLY Blood Culture adequate volume Performed at [facility], [address], [place], [place] [address]    Culture   Final    NO GROWTH 4 DAYS Performed at [facility], [address], [place], [place] [address]    Report Status PENDING  Incomplete  Blood Culture ID Panel (Reflexed)     Status: Abnormal   Collection Time:  02/20/23  6:00 PM  Result Value Ref Range Status   Enterococcus faecalis NOT DETECTED NOT DETECTED Final   Enterococcus Faecium NOT DETECTED NOT DETECTED Final   Listeria monocytogenes NOT DETECTED NOT DETECTED Final   Staphylococcus species NOT DETECTED NOT DETECTED Final   Staphylococcus aureus (BCID) NOT DETECTED NOT DETECTED Final   Staphylococcus epidermidis NOT DETECTED NOT DETECTED Final   Staphylococcus lugdunensis NOT DETECTED NOT DETECTED Final   Streptococcus species DETECTED (A) NOT DETECTED Final    Comment: Not Enterococcus species, Streptococcus agalactiae, Streptococcus pyogenes, or Streptococcus pneumoniae. CRITICAL RESULT CALLED TO, READ BACK BY AND VERIFIED WITH: PHARMD E. JACKSON 02/24/23 @ 0326 BY AB    Streptococcus agalactiae NOT DETECTED NOT DETECTED Final   Streptococcus pneumoniae NOT DETECTED NOT DETECTED Final   Streptococcus pyogenes NOT DETECTED NOT DETECTED Final   A.calcoaceticus-baumannii NOT DETECTED NOT DETECTED Final   Bacteroides fragilis NOT DETECTED NOT DETECTED Final   Enterobacterales NOT DETECTED NOT DETECTED Final   Enterobacter cloacae complex NOT DETECTED NOT DETECTED Final   Escherichia coli NOT DETECTED NOT DETECTED Final   Klebsiella aerogenes NOT DETECTED NOT DETECTED Final   Klebsiella oxytoca NOT DETECTED NOT DETECTED Final   Klebsiella pneumoniae NOT DETECTED NOT DETECTED Final   Proteus species NOT DETECTED NOT DETECTED Final   Salmonella species NOT DETECTED NOT DETECTED Final   Serratia marcescens NOT DETECTED NOT DETECTED Final   Haemophilus influenzae NOT DETECTED NOT DETECTED Final   Neisseria meningitidis NOT DETECTED NOT DETECTED Final   Pseudomonas aeruginosa NOT DETECTED NOT DETECTED Final   Stenotrophomonas maltophilia NOT DETECTED NOT DETECTED Final   Candida albicans NOT DETECTED NOT DETECTED Final   Candida auris NOT DETECTED NOT DETECTED Final   Candida glabrata NOT DETECTED NOT DETECTED Final   Candida krusei NOT  DETECTED NOT DETECTED Final   Candida parapsilosis NOT DETECTED NOT DETECTED Final   Candida tropicalis NOT DETECTED NOT DETECTED Final   Cryptococcus neoformans/gattii NOT DETECTED NOT DETECTED Final    Comment: Performed at [facility], [address], [place], [place] [address]      SIGNED:   Marinda Elk, MD  [facility] 02/24/2023, 8:58 AM Pager   If 7PM-7AM, please contact night-coverage [email or web address] Password [email or web address]

## 2023-02-24 NOTE — Progress Notes (Signed)
PHARMACY - PHYSICIAN COMMUNICATION CRITICAL VALUE ALERT - BLOOD CULTURE IDENTIFICATION (BCID)  Angel Keller. is an 82 y.o. male who presented to [facility] on 02/20/2023 with a chief complaint of generalized weakness  Assessment:  1/4 strep species, no resist  Name of physician (or Provider) Contacted: A Chavez  Current antibiotics: po doxy  Changes to prescribed antibiotics recommended:  Probable contaminant  Results for orders placed or performed during the hospital encounter of 02/20/23  Blood Culture ID Panel (Reflexed) (Collected: 02/20/2023  6:00 PM)  Result Value Ref Range   Enterococcus faecalis NOT DETECTED NOT DETECTED   Enterococcus Faecium NOT DETECTED NOT DETECTED   Listeria monocytogenes NOT DETECTED NOT DETECTED   Staphylococcus species NOT DETECTED NOT DETECTED   Staphylococcus aureus (BCID) NOT DETECTED NOT DETECTED   Staphylococcus epidermidis NOT DETECTED NOT DETECTED   Staphylococcus lugdunensis NOT DETECTED NOT DETECTED   Streptococcus species DETECTED (A) NOT DETECTED   Streptococcus agalactiae NOT DETECTED NOT DETECTED   Streptococcus pneumoniae NOT DETECTED NOT DETECTED   Streptococcus pyogenes NOT DETECTED NOT DETECTED   A.calcoaceticus-baumannii NOT DETECTED NOT DETECTED   Bacteroides fragilis NOT DETECTED NOT DETECTED   Enterobacterales NOT DETECTED NOT DETECTED   Enterobacter cloacae complex NOT DETECTED NOT DETECTED   Escherichia coli NOT DETECTED NOT DETECTED   Klebsiella aerogenes NOT DETECTED NOT DETECTED   Klebsiella oxytoca NOT DETECTED NOT DETECTED   Klebsiella pneumoniae NOT DETECTED NOT DETECTED   Proteus species NOT DETECTED NOT DETECTED   Salmonella species NOT DETECTED NOT DETECTED   Serratia marcescens NOT DETECTED NOT DETECTED   Haemophilus influenzae NOT DETECTED NOT DETECTED   Neisseria meningitidis NOT DETECTED NOT DETECTED   Pseudomonas aeruginosa NOT DETECTED NOT DETECTED   Stenotrophomonas maltophilia NOT DETECTED NOT DETECTED    Candida albicans NOT DETECTED NOT DETECTED   Candida auris NOT DETECTED NOT DETECTED   Candida glabrata NOT DETECTED NOT DETECTED   Candida krusei NOT DETECTED NOT DETECTED   Candida parapsilosis NOT DETECTED NOT DETECTED   Candida tropicalis NOT DETECTED NOT DETECTED   Cryptococcus neoformans/gattii NOT DETECTED NOT DETECTED    Arley Phenix RPh 02/24/2023, 4:02 AM

## 2023-02-24 NOTE — [place] FL2 (Signed)
[place] MEDICAID FL2 LEVEL OF CARE FORM     IDENTIFICATION  Patient Name: Angel Keller Birthdate: [birth year] Sex: male Admission Date (Current Location): 02/20/2023  [facility] and [place] Number:  Producer, television/film/video and Address:  [facility],  [address] 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   Thrombocytopenia (HCC) 07/15/2022   Diarrhea due to malabsorption 02/03/2022   Aortic atherosclerosis (HCC) 11/10/2021   Dehydration 09/21/2021   Nausea with vomiting 09/21/2021   Superficial skin infection 09/19/2021   Lower extremity edema 09/12/2021   Family history of breast cancer 09/06/2021   Family history of lung cancer 09/06/2021   Protein-calorie malnutrition, severe 08/31/2021   SIRS (systemic inflammatory response syndrome) (HCC) 08/28/2021   Paroxysmal atrial fibrillation (HCC) 08/28/2021   Hypotension 08/28/2021   Hypokalemia 08/28/2021   Hyponatremia 08/28/2021   Fever 08/28/2021   Prolonged QT interval 08/28/2021   Abnormal weight loss 08/26/2021   Situational stress 08/26/2021   Port-A-Cath in place 08/20/2021   Local recurrence of pancreatic cancer (HCC) 08/14/2021   Nonspecific abnormal  results of liver function study 08/09/2021   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   Nausea 08/09/2021   Iron deficiency anemia 08/09/2021   Abnormal INR 08/09/2021   Obstructive jaundice 07/18/2021   Acute non-recurrent pansinusitis 07/11/2021   Shortness of breath 07/11/2021   Acute respiratory failure due to COVID-19 (HCC) 07/27/2020   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   Acquired hypothyroidism 07/15/2009   FATIGUE 07/15/2009   SORE THROAT 06/11/2009   ALLERGIC RHINITIS 05/25/2009   HOARSENESS 05/25/2009   HYPERTHYROIDISM 11/07/2008   Essential hypertension 11/07/2008   DIVERTICULOSIS, COLON 11/07/2008   HLD (hyperlipidemia) 10/04/2007   Anxiety 10/04/2007   Depression 10/04/2007   GERD 10/04/2007   COLONIC POLYPS, HX OF 10/04/2007    Orientation RESPIRATION BLADDER Height & Weight     Self, Time, Situation, Place  Normal Incontinent, External catheter Weight: 128 lb 3.2 oz (58.2 kg) Height:  6' (182.9 cm)  BEHAVIORAL SYMPTOMS/MOOD NEUROLOGICAL BOWEL NUTRITION STATUS      Continent Diet (Regular)  AMBULATORY STATUS COMMUNICATION OF NEEDS Skin   Extensive Assist Verbally Skin abrasions                       Personal Care Assistance Level of Assistance  Bathing, Feeding, Dressing Bathing Assistance: Maximum assistance Feeding assistance: Limited assistance Dressing Assistance: Maximum assistance     Functional Limitations Info  Sight, Hearing, Speech Sight Info: Adequate Hearing Info: Impaired Speech Info: Adequate    SPECIAL CARE FACTORS FREQUENCY  PT (By licensed PT), OT (By licensed OT)     PT Frequency: 5x/wk OT Frequency: 5x/wk  Contractures Contractures Info: Not present    Additional Factors Info  Code Status, Allergies Code Status Info: FULL Allergies Info: NKA           Current Medications (02/24/2023):  This is the current hospital active medication list Current  Facility-Administered Medications  Medication Dose Route Frequency Provider Last Rate Last Admin   acetaminophen (TYLENOL) tablet 650 mg  650 mg Oral Q6H PRN Darlin Drop, DO   650 mg at 02/22/23 2100   aspirin EC tablet 81 mg  81 mg Oral Daily Darlin Drop, DO   81 mg at 02/24/23 1027   atorvastatin (LIPITOR) tablet 40 mg  40 mg Oral QHS Hall, Carole N, DO   40 mg at 02/23/23 2059   cyanocobalamin (VITAMIN B12) tablet 1,000 mcg  1,000 mcg Oral BID Dow Adolph N, DO   1,000 mcg at 02/24/23 1027   doxycycline (VIBRA-TABS) tablet 100 mg  100 mg Oral Q12H Green, Terri L, RPH   100 mg at 02/24/23 0844   enoxaparin (LOVENOX) injection 40 mg  40 mg Subcutaneous Q24H Dow Adolph N, DO   40 mg at 02/23/23 2100   escitalopram (LEXAPRO) tablet 20 mg  20 mg Oral Daily Dow Adolph N, DO   20 mg at 02/24/23 1027   levothyroxine (SYNTHROID) tablet 112 mcg  112 mcg Oral QAC breakfast Darlin Drop, DO   112 mcg at 02/24/23 1610   lipase/protease/amylase (CREON) capsule 36,000 Units  36,000 Units Oral TID [facility] Marinda Elk, MD       melatonin tablet 5 mg  5 mg Oral QHS PRN Dow Adolph N, DO   5 mg at 02/23/23 2100   multivitamin with minerals tablet 1 tablet  1 tablet Oral Daily Marinda Elk, MD   1 tablet at 02/24/23 1027   polyethylene glycol (MIRALAX / GLYCOLAX) packet 17 g  17 g Oral Daily PRN Dow Adolph N, DO       prochlorperazine (COMPAZINE) injection 5 mg  5 mg Intravenous Q6H PRN Dow Adolph N, DO       psyllium (HYDROCIL/METAMUCIL) 1 packet  1 packet Oral Daily Marinda Elk, MD   1 packet at 02/24/23 1027     Discharge Medications: Please see discharge summary for a list of discharge medications.  Relevant Imaging Results:  Relevant Lab Results:   Additional Information SSN: [id number]  [name], LCSW

## 2023-02-24 NOTE — Progress Notes (Signed)
Nursing report called and given to Drenita at [facility]. No questions expressed at this time by receiving facility. Awaiting [facility] arrival for patient transport.

## 2023-02-25 LAB — CULTURE, BLOOD (ROUTINE X 2)

## 2023-02-26 LAB — CULTURE, BLOOD (ROUTINE X 2): Special Requests: ADEQUATE

## 2023-02-27 ENCOUNTER — Other Ambulatory Visit (HOSPITAL_COMMUNITY): Payer: Self-pay

## 2023-02-27 DIAGNOSIS — E43 Unspecified severe protein-calorie malnutrition: Secondary | ICD-10-CM | POA: Diagnosis not present

## 2023-02-27 DIAGNOSIS — I10 Essential (primary) hypertension: Secondary | ICD-10-CM | POA: Diagnosis not present

## 2023-02-27 DIAGNOSIS — I639 Cerebral infarction, unspecified: Secondary | ICD-10-CM | POA: Diagnosis not present

## 2023-02-27 DIAGNOSIS — R627 Adult failure to thrive: Secondary | ICD-10-CM | POA: Diagnosis not present

## 2023-02-27 DIAGNOSIS — E039 Hypothyroidism, unspecified: Secondary | ICD-10-CM | POA: Diagnosis not present

## 2023-02-27 DIAGNOSIS — F411 Generalized anxiety disorder: Secondary | ICD-10-CM | POA: Diagnosis not present

## 2023-02-27 DIAGNOSIS — L03113 Cellulitis of right upper limb: Secondary | ICD-10-CM | POA: Diagnosis not present

## 2023-02-27 DIAGNOSIS — D469 Myelodysplastic syndrome, unspecified: Secondary | ICD-10-CM | POA: Diagnosis not present

## 2023-02-27 DIAGNOSIS — C257 Malignant neoplasm of other parts of pancreas: Secondary | ICD-10-CM | POA: Diagnosis not present

## 2023-02-28 ENCOUNTER — Encounter (HOSPITAL_BASED_OUTPATIENT_CLINIC_OR_DEPARTMENT_OTHER): Payer: Medicare HMO | Admitting: General Surgery

## 2023-03-01 DIAGNOSIS — R627 Adult failure to thrive: Secondary | ICD-10-CM | POA: Diagnosis not present

## 2023-03-01 DIAGNOSIS — E039 Hypothyroidism, unspecified: Secondary | ICD-10-CM | POA: Diagnosis not present

## 2023-03-01 DIAGNOSIS — L03113 Cellulitis of right upper limb: Secondary | ICD-10-CM | POA: Diagnosis not present

## 2023-03-01 DIAGNOSIS — I10 Essential (primary) hypertension: Secondary | ICD-10-CM | POA: Diagnosis not present

## 2023-03-02 ENCOUNTER — Other Ambulatory Visit (HOSPITAL_COMMUNITY): Payer: Self-pay

## 2023-03-03 DIAGNOSIS — I639 Cerebral infarction, unspecified: Secondary | ICD-10-CM | POA: Diagnosis not present

## 2023-03-03 DIAGNOSIS — C259 Malignant neoplasm of pancreas, unspecified: Secondary | ICD-10-CM | POA: Diagnosis not present

## 2023-03-06 DIAGNOSIS — U071 COVID-19: Secondary | ICD-10-CM | POA: Diagnosis not present

## 2023-03-06 DIAGNOSIS — R627 Adult failure to thrive: Secondary | ICD-10-CM | POA: Diagnosis not present

## 2023-03-06 DIAGNOSIS — I10 Essential (primary) hypertension: Secondary | ICD-10-CM | POA: Diagnosis not present

## 2023-03-06 DIAGNOSIS — F411 Generalized anxiety disorder: Secondary | ICD-10-CM | POA: Diagnosis not present

## 2023-03-07 ENCOUNTER — Other Ambulatory Visit: Payer: Self-pay

## 2023-03-08 ENCOUNTER — Other Ambulatory Visit (HOSPITAL_COMMUNITY): Payer: Self-pay

## 2023-03-21 ENCOUNTER — Ambulatory Visit: Payer: Medicare HMO | Admitting: Hematology

## 2023-03-21 ENCOUNTER — Other Ambulatory Visit: Payer: Medicare HMO

## 2023-03-27 NOTE — Assessment & Plan Note (Signed)
developed worsening thrombocytopenia in December 2023, s/p platelet transfusion.  -Bone marrow biopsy 07/28/22 showed hypercellular marrow and megakaryocytes dyspoiesis, which supports MDS.  Biopsy was negative for metastatic adenocarcinoma.  -cytogenetics and MDS FISH were normal  -he has responded well to Promacta -Due to his recent stroke, will keep Promacta at low-dose, and keep platelet count in 100-200 range.  Promacta was held when he had a ED visit for diarrhea on 11/28/2021. Due to normal plt, I have not restarted promacta yet.

## 2023-03-27 NOTE — Assessment & Plan Note (Signed)
cT2N0M0, stage IB, ypT2N0  -diagnosed in 07/2021, s/p neoadjuvant chemo gemcitabine alone on 08/20/21. Abraxane was added with cycle 2 (09/17/21), but he tolerated poorly and discontinued after first dose.  -s/p whipple 11/15/21 with Dr. Freida Busman. 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 Margins and lymph nodes negative. -s/p adjuvant gemcitabine 01/20/22 - 06/18/2022 -restaging CT from 08/17/22 showed small soft tissue nodule or lymph node interposed between the anterior aorta, portal vein, left renal vein, and superior mesenteric artery measuring 1.3 x 0.8 cm. This is new or at least significantly increased compared to prior CT 05/03/2022 and suspicious for local recurrence or nodal metastasis -unfortunately biopsy is not feasible due to location  -S/p SBRT, completed 09/30/2022 -Surveillance CT scan from Dec 15, 2022 showed no definitive evidence of recurrence, however the skin quality is limited due to lack of IV contrast, especially in the surgical site.  He attempted MRI of abdomen with and without contrast on February 13, 2023, but the quality of imaging was compromised due to breathing moving artifact, there is no definitive malignancy on the MRI. -His overall health mind frequently, he was hospitalized twice in July 2024 for fall and cellulitis.  He is recovering slowly.

## 2023-03-27 NOTE — Assessment & Plan Note (Signed)
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

## 2023-03-28 ENCOUNTER — Other Ambulatory Visit: Payer: Self-pay

## 2023-03-28 ENCOUNTER — Inpatient Hospital Stay: Attending: Physician Assistant

## 2023-03-28 ENCOUNTER — Inpatient Hospital Stay: Admitting: Hematology

## 2023-03-28 VITALS — BP 182/89 | HR 77 | Temp 98.0°F | Resp 20 | Ht 72.0 in | Wt 124.5 lb

## 2023-03-28 DIAGNOSIS — C251 Malignant neoplasm of body of pancreas: Secondary | ICD-10-CM | POA: Insufficient documentation

## 2023-03-28 DIAGNOSIS — D469 Myelodysplastic syndrome, unspecified: Secondary | ICD-10-CM | POA: Insufficient documentation

## 2023-03-28 DIAGNOSIS — C259 Malignant neoplasm of pancreas, unspecified: Secondary | ICD-10-CM | POA: Diagnosis not present

## 2023-03-28 DIAGNOSIS — D696 Thrombocytopenia, unspecified: Secondary | ICD-10-CM | POA: Diagnosis not present

## 2023-03-28 DIAGNOSIS — Z95828 Presence of other vascular implants and grafts: Secondary | ICD-10-CM

## 2023-03-28 DIAGNOSIS — C250 Malignant neoplasm of head of pancreas: Secondary | ICD-10-CM

## 2023-03-28 LAB — CMP (CANCER CENTER ONLY)
ALT: 14 U/L (ref 0–44)
AST: 24 U/L (ref 15–41)
Albumin: 3.9 g/dL (ref 3.5–5.0)
Alkaline Phosphatase: 71 U/L (ref 38–126)
Anion gap: 6 (ref 5–15)
BUN: 30 mg/dL — ABNORMAL HIGH (ref 8–23)
CO2: 27 mmol/L (ref 22–32)
Calcium: 9.1 mg/dL (ref 8.9–10.3)
Chloride: 105 mmol/L (ref 98–111)
Creatinine: 1.15 mg/dL (ref 0.61–1.24)
GFR, Estimated: >60 mL/min (ref >60)
Glucose, Bld: 66 mg/dL — ABNORMAL LOW (ref 70–99)
Potassium: 4.1 mmol/L (ref 3.5–5.1)
Sodium: 138 mmol/L (ref 135–145)
Total Bilirubin: 0.4 mg/dL (ref 0.3–1.2)
Total Protein: 7.6 g/dL (ref 6.5–8.1)

## 2023-03-28 LAB — CBC WITH DIFFERENTIAL (CANCER CENTER ONLY)
Abs Immature Granulocytes: 0.02 10*3/uL (ref 0.00–0.07)
Basophils Absolute: 0 10*3/uL (ref 0.0–0.1)
Basophils Relative: 0 %
Eosinophils Absolute: 0.2 10*3/uL (ref 0.0–0.5)
Eosinophils Relative: 3 %
HCT: 27.7 % — ABNORMAL LOW (ref 39.0–52.0)
Hemoglobin: 9.1 g/dL — ABNORMAL LOW (ref 13.0–17.0)
Immature Granulocytes: 0 %
Lymphocytes Relative: 40 %
Lymphs Abs: 3.2 10*3/uL (ref 0.7–4.0)
MCH: 26.3 pg (ref 26.0–34.0)
MCHC: 32.9 g/dL (ref 30.0–36.0)
MCV: 80.1 fL (ref 80.0–100.0)
Monocytes Absolute: 0.7 10*3/uL (ref 0.1–1.0)
Monocytes Relative: 9 %
Neutro Abs: 3.7 10*3/uL (ref 1.7–7.7)
Neutrophils Relative %: 48 %
Platelet Count: 230 10*3/uL (ref 150–400)
RBC: 3.46 MIL/uL — ABNORMAL LOW (ref 4.22–5.81)
RDW: 17.3 % — ABNORMAL HIGH (ref 11.5–15.5)
WBC Count: 7.9 10*3/uL (ref 4.0–10.5)
nRBC: 0 % (ref 0.0–0.2)

## 2023-03-28 MED ORDER — HEPARIN SOD (PORK) LOCK FLUSH 100 UNIT/ML IV SOLN
500.0000 [IU] | Freq: Once | INTRAVENOUS | Status: AC | PRN
  Administered 2023-03-28: 500 [IU]

## 2023-03-28 MED ORDER — SODIUM CHLORIDE 0.9% FLUSH
10.0000 mL | INTRAVENOUS | Status: DC | PRN
  Administered 2023-03-28: 10 mL

## 2023-03-28 MED ORDER — OXYBUTYNIN CHLORIDE ER 10 MG PO TB24: 10.0000 mg | ORAL_TABLET | Freq: Every day | ORAL | 0 refills | Status: DC

## 2023-03-28 NOTE — Progress Notes (Signed)
[facility]   [phone] Fax:([phone]   Clinic Follow up Note   Patient Care Team: Carlean Jews, NP as PCP - General (Family Medicine) Jodelle Red, MD as PCP - Cardiology (Cardiology) Malachy Mood, MD as Consulting Physician (Oncology) Marily Lente, RN as Oncology Nurse Navigator (Oncology)  Date of Service:  03/28/2023  CHIEF COMPLAINT: f/u of pancreatic cancer and MDS   CURRENT THERAPY:  Pancreatic cancer: s/p neoadjuvant chemotherapy, whipple 11/15/21, adjuvant chemo 06/18/22, and SBRT to oligo metastatic disease completed 09/30/22. Now on observation  MDS:  was on Promacta, held since 11/29/2022     ASSESSMENT:  Angel Keller. is a 82 y.o. 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  -Bone marrow biopsy 07/28/22 showed hypercellular marrow and megakaryocytes dyspoiesis, which supports MDS.  Biopsy was negative for metastatic adenocarcinoma.  -cytogenetics and MDS FISH were normal  -he has responded well to Promacta -Due to his recent stroke, will keep Promacta at low-dose, and keep platelet count in 100-200 range.  Promacta was held when he had a ED visit for diarrhea on 11/28/2021. Due to normal plt, I have not restarted promacta yet.   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 Abraxane was added with cycle 2 (09/17/21), but he tolerated poorly and discontinued after first dose.  -s/p whipple 11/15/21 with Dr. Freida Busman. 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 Margins and lymph nodes negative. -s/p adjuvant gemcitabine 01/20/22 - 06/18/2022 -restaging CT from 08/17/22 showed small soft  tissue nodule or lymph node interposed between the anterior aorta, portal vein, left renal vein, and superior mesenteric artery measuring 1.3 x 0.8 cm. This is new or at least significantly increased compared to prior CT 05/03/2022 and suspicious for local recurrence or nodal metastasis -unfortunately biopsy is not feasible due to location  -S/p SBRT, completed 09/30/2022 -Surveillance CT scan from Dec 15, 2022 showed no definitive evidence of recurrence, however the skin quality is limited due to lack of IV contrast, especially in the surgical site.  He attempted MRI of abdomen with and without contrast on February 13, 2023, but the quality of imaging was compromised due to breathing moving artifact, there is no definitive malignancy on the MRI. -His overall health has declined lately, he was hospitalized twice in July 2024 for fall and cellulitis, he recovered overall well. -pt is not interested in more cancer treatment, he would like to continue palliative home care, and see me as needed in future.  I think this is very reasonable given his advanced age and multiple medical issues.       PLAN: -lab -reviewed hg 9.1 -Continue observation and Palliative home care. -I encourage the pt to monitor BP at home. - keep f/u open.  He will call me if he wants to see me back.   SUMMARY OF ONCOLOGIC HISTORY: Oncology History Overview Note   Cancer Staging  Pancreatic cancer [facility]) Staging form: Exocrine Pancreas, AJCC 8th Edition - Clinical stage from 08/05/2021: Stage IB (cT2, cN0, cM0) - Signed by Malachy Mood, MD on 08/17/2021 Stage prefix: Initial diagnosis Total positive nodes: 0     Pancreatic mass (Resolved)  07/18/2021 Initial Diagnosis   Pancreatic mass  07/18/2021 Imaging   CT Abdomen Pelvis W Contrast  IMPRESSION: 1. Suspected solid mass within the proximal body of the pancreas measures 2.3 x 2.6 x 2.0 cm. There is downstream dilation of the main pancreatic duct and its branches. There is  also intra and extrahepatic biliary ductal dilation. Further evaluation with MRI of the abdomen, pancreatic protocol, when clinically feasible, may be considered. 2. 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 3. Aortic atherosclerosis   07/20/2021 Procedure   DG ERCP  IMPRESSION: Nondiagnostic ERCP as above. Correlation with the operative report is advised.  - The examination was suspicious for a biliary stricture in the bile duct. - Examination was suspicious for carcinoma of the head of the pancreas. - Attempts at a cholangiogram failed.   07/22/2021 Pathology Results   CASE: [id number]   FINAL MICROSCOPIC DIAGNOSIS:  - Suspicious for malignancy  - See comment   DIAGNOSTIC COMMENTS:  There are rare cells with cytologic atypia suspicious for  adenocarcinoma.  Dr. Kenard Gower agrees.    07/22/2021 Procedure   IR INT EXT BILIARY DRAIN WITH CHOLANGIOGRAM ( IMPRESSION: 1. 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 2. Successful brush biopsy of obstructing lesion x3. 3. Successful placement of a 10 French internal/external biliary drainage catheter.   PLAN: 1. Follow bilirubin. When significant downward trend is clear, the bag should be capped. Recommend capping bag before discharge home if possible. 2. Follow-up in IR in 4-6 weeks for initial biliary tube check and exchange. If initial brush biopsies are negative, repeat biopsy could be considered at that time.   Local recurrence of pancreatic cancer (HCC)  08/05/2021 Cancer Staging   Staging form: Exocrine Pancreas, AJCC 8th Edition - Clinical stage from 08/05/2021: Stage IB (cT2, cN0, cM0) - Signed by Malachy Mood, MD on 08/17/2021 Stage prefix: Initial diagnosis Total positive nodes: 0   08/14/2021 Initial Diagnosis   Pancreatic cancer (HCC)   08/20/2021 - 03/23/2022 Chemotherapy   Patient is on Treatment Plan : PANCREATIC Abraxane /  Gemcitabine D1,8 q21d     08/20/2021 -  Chemotherapy   Patient is on Treatment Plan : PANCREAS Gemcitabine D1,8 (1000) q21d x 8 Cycles     11/15/2021 Cancer Staging   Staging form: Exocrine Pancreas, AJCC 8th Edition - Pathologic stage from 11/15/2021: Stage IB (ypT2, pN0, cM0) - Signed by Malachy Mood, MD on 01/03/2022 Stage prefix: Post-therapy Total positive nodes: 0 Histologic grade (G): G3 Histologic grading system: 3 grade system Residual tumor (R): R0 - None      INTERVAL HISTORY:  Angel Frederic Sr. is here for a follow up of pancreatic cancer and MDS. He was last seen by me on 01/23/2023. He presents to the clinic accompanied by son. Pt state that he is doing well, since being release from the hospital. Pt state that he has been well compression socks. Pt report that he is doing some exercise at home. Pt state that his appetite is good. He denies having any pain and any BM issues.    All other systems were reviewed with the patient and are negative.  MEDICAL HISTORY:  Past Medical History:  Diagnosis Date   Anxiety    Arthritis    Depression    Family history of breast cancer    Family history of lung cancer    Headache    HLD (hyperlipidemia)    Hx of inguinal hernia repair    Hypertension    Hypothyroidism  Nausea with vomiting 09/21/2021   Overactive bladder    Pancreatic cancer (HCC)    Pneumonia    Shingles    Sleep apnea    Patient denies   Thoracic ascending aortic aneurysm (HCC)    mildly dilated 4.0cm per 08/12/21 CT   Thyroid disease    Varicose veins of both lower extremities     SURGICAL HISTORY: Past Surgical History:  Procedure Laterality Date   BILATERAL CARPAL TUNNEL RELEASE     CATARACT EXTRACTION, BILATERAL Bilateral    ELBOW SURGERY Bilateral    ENDOSCOPIC RETROGRADE CHOLANGIOPANCREATOGRAPHY (ERCP) WITH PROPOFOL N/A 07/20/2021   Procedure: ENDOSCOPIC RETROGRADE CHOLANGIOPANCREATOGRAPHY (ERCP) WITH PROPOFOL;  Surgeon: Iva Boop, MD;   Location: [facility] ENDOSCOPY;  Service: Endoscopy;  Laterality: N/A;   ESOPHAGOGASTRODUODENOSCOPY (EGD) WITH PROPOFOL N/A 08/05/2021   Procedure: ESOPHAGOGASTRODUODENOSCOPY (EGD) WITH PROPOFOL;  Surgeon: Meridee Score Netty Starring., MD;  Location: [facility] ENDOSCOPY;  Service: Gastroenterology;  Laterality: N/A;   EUS N/A 08/05/2021   Procedure: UPPER ENDOSCOPIC ULTRASOUND (EUS) RADIAL;  Surgeon: Lemar Lofty., MD;  Location: [facility] ENDOSCOPY;  Service: Gastroenterology;  Laterality: N/A;   EYE SURGERY     bilateral cataracts   FINE NEEDLE ASPIRATION  08/05/2021   Procedure: FINE NEEDLE ASPIRATION (FNA) LINEAR;  Surgeon: Lemar Lofty., MD;  Location: [facility] ENDOSCOPY;  Service: Gastroenterology;;   INGUINAL HERNIA REPAIR Right 1951   IR ANGIO INTRA EXTRACRAN SEL COM CAROTID INNOMINATE BILAT MOD SED  02/20/2019   IR BILIARY STENT(S) EXISTING ACCESS INC DILATION CATH EXCHANGE  09/20/2021   IR ENDOLUMINAL BX OF BILIARY TREE  07/22/2021   IR GENERIC HISTORICAL  10/28/2016   IR RADIOLOGIST EVAL & MGMT 10/28/2016 [facility]-INTERV RAD   IR INT EXT BILIARY DRAIN WITH CHOLANGIOGRAM  07/22/2021   JOINT REPLACEMENT     LAPAROSCOPY N/A 11/15/2021   Procedure: LAPAROSCOPIC DIAGNOSTIC STAGING;  Surgeon: Fritzi Mandes, MD;  Location: [facility] OR;  Service: General;  Laterality: N/A;   PORTACATH PLACEMENT N/A 08/19/2021   Procedure: INSERTION PORT-A-CATH;  Surgeon: Fritzi Mandes, MD;  Location: [facility] ORS;  Service: General;  Laterality: N/A;  LMA   RADIOLOGY WITH ANESTHESIA N/A 10/21/2015   Procedure: RADIOLOGY WITH ANESTHESIA;  Surgeon: Julieanne Cotton, MD;  Location: [facility] OR;  Service: Radiology;  Laterality: N/A;   RADIOLOGY WITH ANESTHESIA N/A 02/20/2019   Procedure: Sharman Crate;  Surgeon: Julieanne Cotton, MD;  Location: [facility] OR;  Service: Radiology;  Laterality: N/A;   ROTATOR CUFF REPAIR Bilateral    VARICOSE VEIN SURGERY     WHIPPLE PROCEDURE N/A 11/15/2021   Procedure: WHIPPLE PROCEDURE;  Surgeon: Fritzi Mandes,  MD;  Location: [facility] OR;  Service: General;  Laterality: N/A;    I have reviewed the social history and family history with the patient and they are unchanged from previous note.  ALLERGIES:  has No Known Allergies.  MEDICATIONS:  Current Outpatient Medications  Medication Sig Dispense Refill   oxybutynin (DITROPAN XL) 10 MG 24 hr tablet Take 1 tablet (10 mg total) by mouth at bedtime. 30 tablet 0   acetaminophen (TYLENOL) 500 MG tablet Take 1,000 mg by mouth as needed for headache.     atorvastatin (LIPITOR) 40 MG tablet TAKE 1 TABLET (40 MG TOTAL) BY MOUTH AT BEDTIME. 30 tablet 3   escitalopram (LEXAPRO) 20 MG tablet Take 1 tablet (20 mg total) by mouth daily. 90 tablet 1   feeding supplement (ENSURE ENLIVE / ENSURE PLUS) LIQD Take 237 mLs by mouth 2 (two) times daily between  meals. (Patient taking differently: Take 237 mLs by mouth daily. When compliant) 237 mL 12   levothyroxine (SYNTHROID) 112 MCG tablet Take 1 tablet (112 mcg total) by mouth daily before breakfast. 90 tablet 1   lidocaine-prilocaine (EMLA) cream Apply topically to affected area(s) as needed. (Patient taking differently: Apply 1 Application topically as needed (port).) 30 g 0   lipase/protease/amylase (CREON) 36000 UNITS CPEP capsule Take 1 capsule (36,000 Units total) by mouth 3 (three) times daily before meals. 180 capsule 3   mirtazapine (REMERON) 7.5 MG tablet Take 1 tablet (7.5 mg total) by mouth at bedtime. 90 tablet 1   No current facility-administered medications for this visit.    PHYSICAL EXAMINATION: ECOG PERFORMANCE STATUS: 2 - Symptomatic, <50% confined to bed  Vitals:   03/28/23 1135  BP: (!) 182/89  Pulse: 77  Resp: 20  Temp: 98 F (36.7 C)  SpO2: 100%   Wt Readings from Last 3 Encounters:  03/28/23 124 lb 8 oz (56.5 kg)  02/23/23 128 lb 3.2 oz (58.2 kg)  02/18/23 134 lb 0.6 oz (60.8 kg)     GENERAL:alert, no distress and comfortable SKIN: skin color normal, no rashes or significant  lesions EYES: normal, Conjunctiva are pink and non-injected, sclera clear  NEURO: alert & oriented x 3 with fluent speech  LABORATORY DATA:  I have reviewed the data as listed    Latest Ref Rng & Units 03/28/2023   10:59 AM 02/21/2023    5:46 AM 02/20/2023    6:00 PM  CBC  WBC 4.0 - 10.5 K/uL 7.9  9.6  11.1   Hemoglobin 13.0 - 17.0 g/dL 9.1  8.3  8.8   Hematocrit 39.0 - 52.0 % 27.7  25.7  27.3   Platelets 150 - 400 K/uL 230  276  249         Latest Ref Rng & Units 03/28/2023   10:59 AM 02/21/2023    5:46 AM 02/20/2023    6:00 PM  CMP  Glucose 70 - 99 mg/dL 66  409  811   BUN 8 - 23 mg/dL 30  20  19    Creatinine 0.61 - 1.24 mg/dL 9.14  7.82  9.56   Sodium 135 - 145 mmol/L 138  135  133   Potassium 3.5 - 5.1 mmol/L 4.1  3.6  4.0   Chloride 98 - 111 mmol/L 105  104  102   CO2 22 - 32 mmol/L 27  20  22    Calcium 8.9 - 10.3 mg/dL 9.1  8.2  8.3   Total Protein 6.5 - 8.1 g/dL 7.6  6.5  6.9   Total Bilirubin 0.3 - 1.2 mg/dL 0.4  0.3  0.3   Alkaline Phos 38 - 126 U/L 71  123  129   AST 15 - 41 U/L 24  38  40   ALT 0 - 44 U/L 14  39  42       RADIOGRAPHIC STUDIES: I have personally reviewed the radiological images as listed and agreed with the findings in the report. No results found.    No orders of the defined types were placed in this encounter.  All questions were answered. The patient knows to call the clinic with any problems, questions or concerns. No barriers to learning was detected. The total time spent in the appointment was 30 minutes.     Malachy Mood, MD 03/28/2023   Carolin Coy, CMA, am acting as scribe for Malachy Mood, MD.  I have reviewed the above documentation for accuracy and completeness, and I agree with the above.

## 2023-04-02 IMAGING — XA IR BILIARY STENT EXISTING ACCESSINC DILATION, CATH EXCHANGE
4 series · 14 of 14 positions shown · non-contrast
Comparison: none

INDICATION: 80-year-old male with pancreatic cancer and malignant obstructed
jaundice. A percutaneous internal/external biliary drain was placed
on 07/22/2021. Patient presents today for internalization with
placement of an self expanding metallic biliary stent.

[Series 1: fl (-) angio · 1 of 1 slices shown (1 of 3)]
[im 1/1]
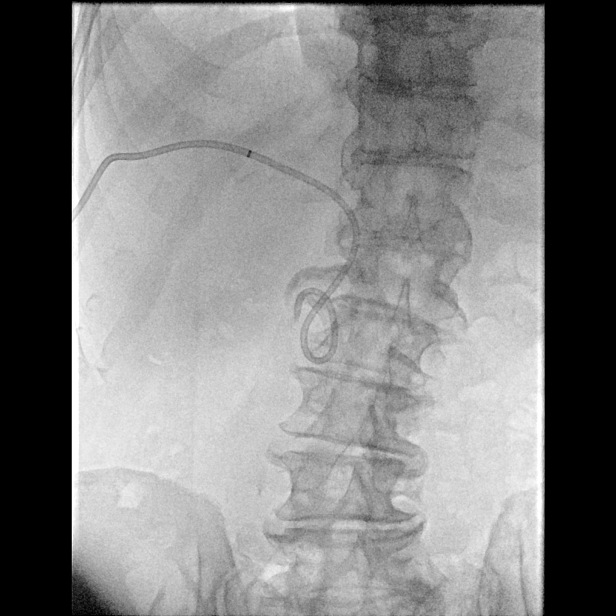

[Series 2: fl (-) angio · 4 acquisitions, 7 frames shown (2 of 3)]
[im 1/4]
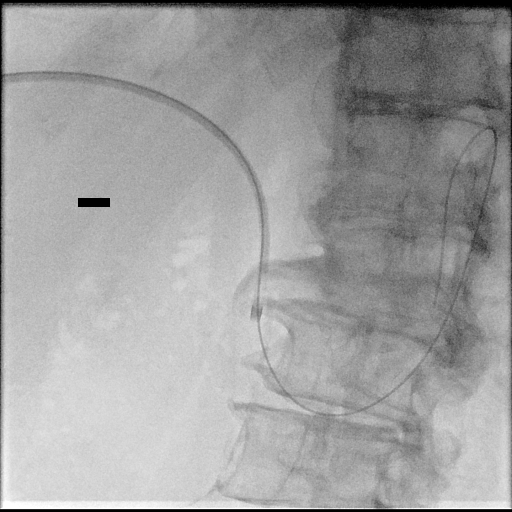
[im 1/4]
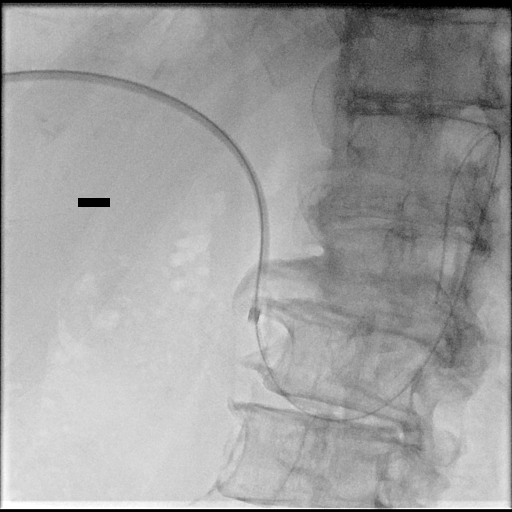
[im 1/4]
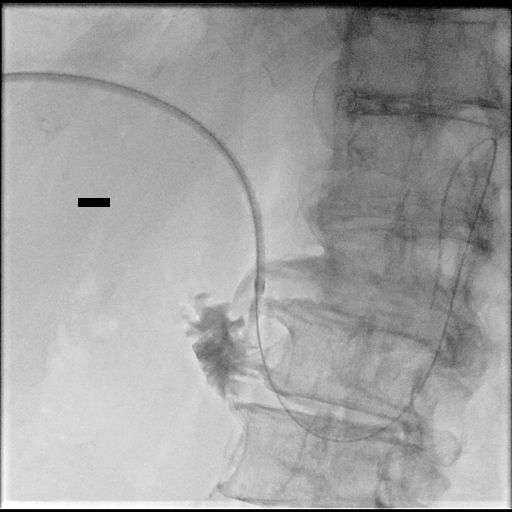
[im 1/4]
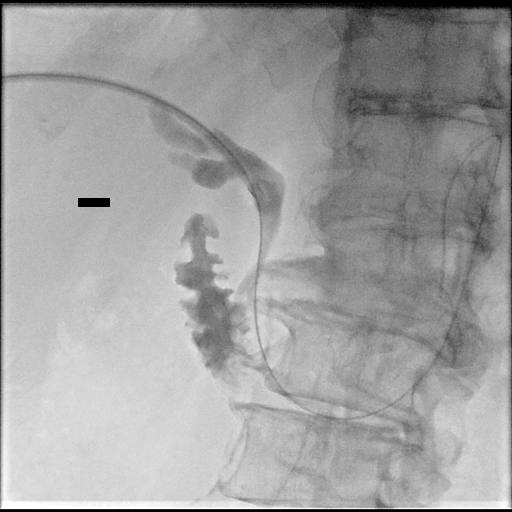
[im 2/4]
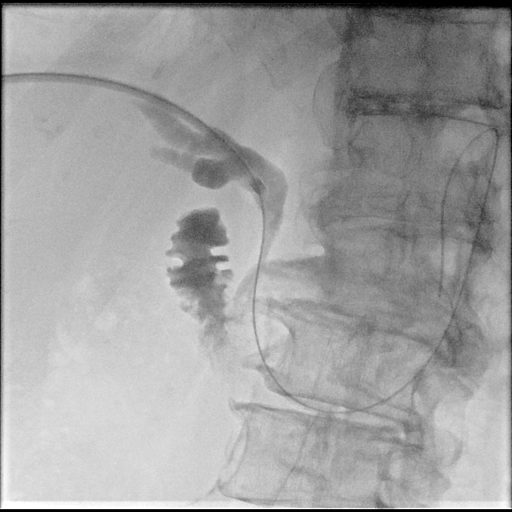
[im 3/4]
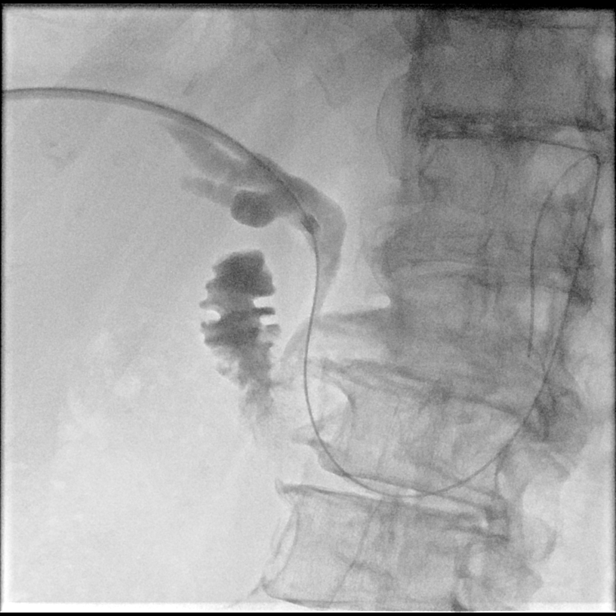
[im 4/4]
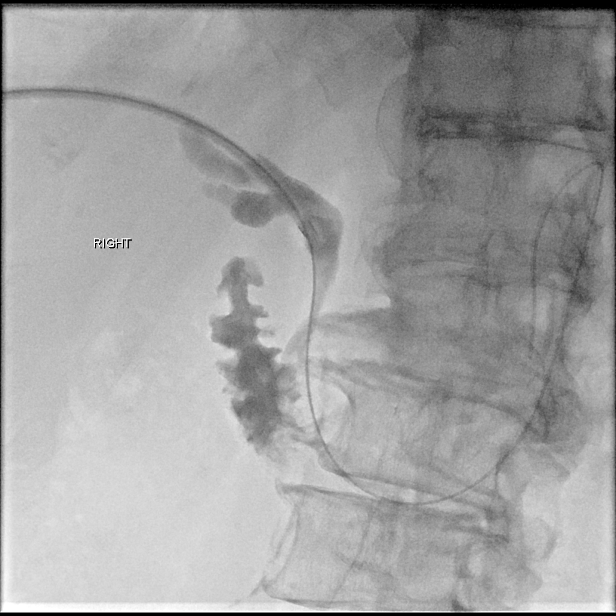

[Series 3: body 4 care · 2 acquisitions, 5 frames shown]
[im 1/2]
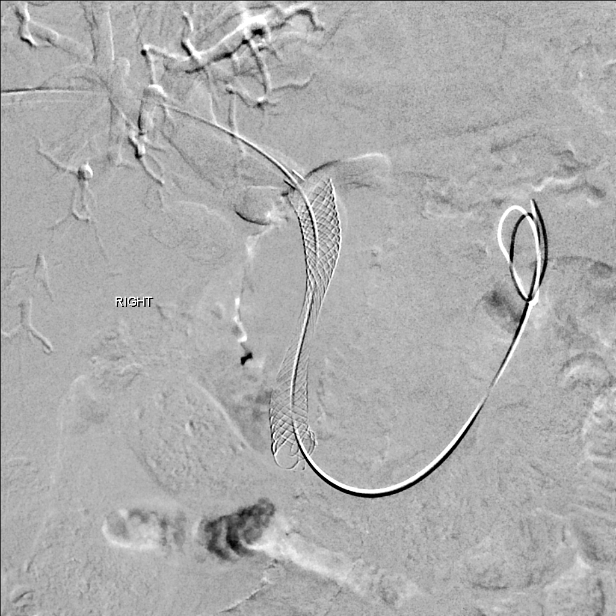
[im 1/2]
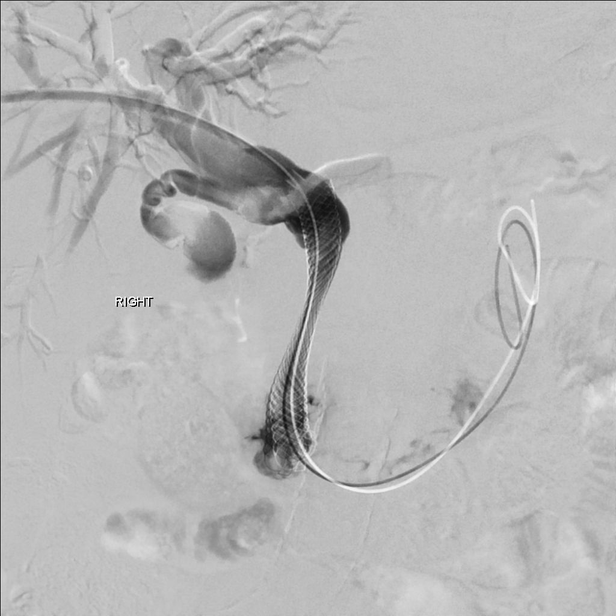
[im 1/2]
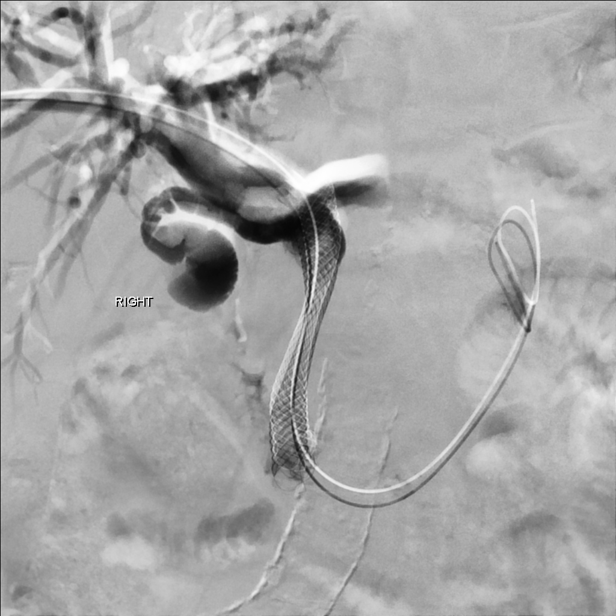
[im 1/2]
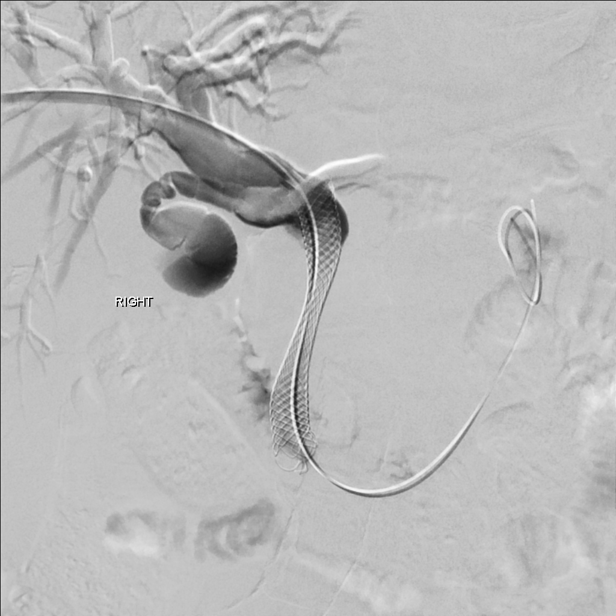
[im 2/2]
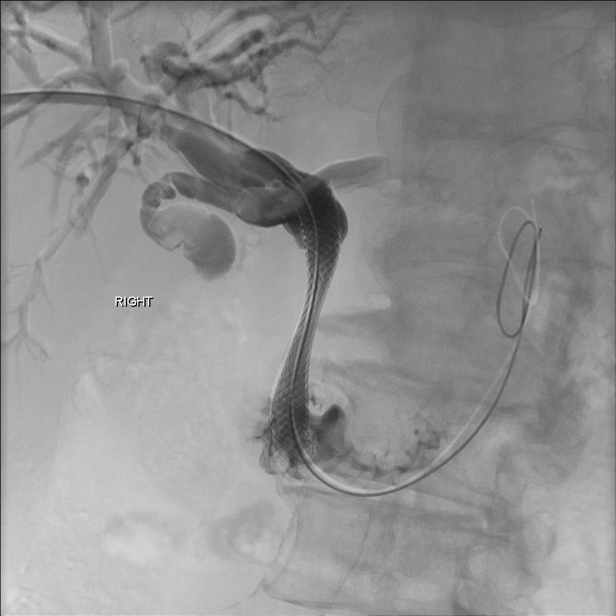

[Series 4: fl (-) angio · 1 of 1 slices shown (3 of 3)]
[im 1/1]
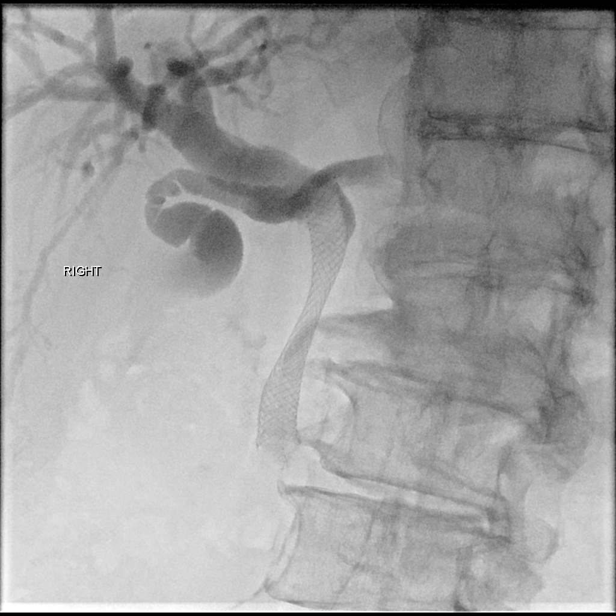

[14 of 14 positions shown; findings below may reference images not displayed]

EXAM:
Biliary stent placement

MEDICATIONS:
2 g Ancef; The antibiotic was administered within an appropriate
time frame prior to the initiation of the procedure.

ANESTHESIA/SEDATION:
Moderate (conscious) sedation was employed during this procedure. A
total of Versed 1 mg and Fentanyl 50 mcg was administered
intravenously by the radiology nurse.

Total intra-service moderate Sedation Time: 15 minutes. The
patient's level of consciousness and vital signs were monitored
continuously by radiology nursing throughout the procedure under my
direct supervision.

FLUOROSCOPY:
Radiation Exposure Index (as provided by the fluoroscopic device):
45 mGy Kerma

COMPLICATIONS:
None immediate.

PROCEDURE:
Informed written consent was obtained from the patient after a
thorough discussion of the procedural risks, benefits and
alternatives. All questions were addressed. Maximal Sterile Barrier
Technique was utilized including caps, mask, sterile gowns, sterile
gloves, sterile drape, hand hygiene and skin antiseptic. A timeout
was performed prior to the initiation of the procedure.

Local anesthesia was attained by infiltration with 1% lidocaine. The
retention suture was cut. The tube was transected and removed over a
Bentson wire. A 5 French Amplatz catheter was advanced over the wire
and used to manipulate the wire into the first portion of the
jejunum. The catheter was removed. An 8 French Brite tip sheath was
advanced over the wire and into the duodenum. A pull-back through
the sheath cholangiogram was then performed outlining the malignant
stricture.

A wall flex self expanding covered biliary stent measuring 10 mm in
diameter by 6 cm in length was selected. The stent was advanced over
the wire and deployed across the malignant stricture. The sheath was
reintroduced. Contrast injection demonstrates excellent positioning
of the stent with excellent drainage of injected contrast material
through the stent.

The decision was made to proceed with removal of the sheath and
wire. Given performance of the stent, it was felt that there was no
need to leave a biliary drain behind.
IMPRESSION: Successful placement of 10 x 60 covered WalFlex biliary stent.

## 2023-04-03 ENCOUNTER — Other Ambulatory Visit: Payer: Self-pay

## 2023-04-04 ENCOUNTER — Ambulatory Visit: Payer: Medicare HMO | Admitting: Family Medicine

## 2023-04-04 NOTE — Progress Notes (Deleted)
   Established Patient Office Visit  Subjective   Patient ID: Angel Keller., male    DOB: [birth year]  Age: 82 y.o. MRN: [id number]  No chief complaint on file.   HPI  HTN - what dosage - cards.   Weight loss - megace -   Pancreatiic cancer  Palliative home care   The ASCVD Risk score (Arnett DK, et al., 2019) failed to calculate for the following reasons:   The 2019 ASCVD risk score is only valid for ages 24 to 25   The patient has a prior MI or stroke diagnosis  Health Maintenance Due  Topic Date Due   Zoster Vaccines- Shingrix (1 of 2) Never done   DTaP/Tdap/Td (2 - Tdap) 11/08/2018   COVID-19 Vaccine (3 - Pfizer risk series) 02/12/2020   INFLUENZA VACCINE  03/16/2023      Objective:     There were no vitals taken for this visit. {Vitals History (Optional):23777}  Physical Exam   No results found for any visits on 04/04/23.      Assessment & Plan:   There are no diagnoses linked to this encounter.   No follow-ups on file.    Sandre Kitty, MD

## 2023-04-05 ENCOUNTER — Other Ambulatory Visit: Payer: Self-pay

## 2023-07-07 ENCOUNTER — Ambulatory Visit (HOSPITAL_BASED_OUTPATIENT_CLINIC_OR_DEPARTMENT_OTHER): Payer: Medicare HMO | Admitting: Cardiology

## 2023-07-19 ENCOUNTER — Encounter: Payer: Self-pay | Admitting: Hematology

## 2023-08-31 ENCOUNTER — Encounter: Payer: Self-pay | Admitting: Hematology

## 2023-08-31 ENCOUNTER — Other Ambulatory Visit (HOSPITAL_COMMUNITY): Payer: Self-pay

## 2023-08-31 MED ORDER — PANCREAZE 2600-8800 UNITS PO CPEP
ORAL_CAPSULE | ORAL | 3 refills | Status: AC
  Filled 2023-08-31: qty 100, 30d supply, fill #0
  Filled 2023-09-29 (×2): qty 100, 30d supply, fill #1
  Filled 2024-01-27: qty 100, 30d supply, fill #2
  Filled 2024-03-09 – 2024-05-19 (×2): qty 100, 30d supply, fill #3
  Filled 2024-06-27: qty 60, 15d supply, fill #3

## 2023-09-01 ENCOUNTER — Other Ambulatory Visit (HOSPITAL_COMMUNITY): Payer: Self-pay

## 2023-09-29 ENCOUNTER — Other Ambulatory Visit (HOSPITAL_COMMUNITY): Payer: Self-pay

## 2023-10-26 ENCOUNTER — Other Ambulatory Visit (HOSPITAL_COMMUNITY): Payer: Self-pay

## 2023-10-26 MED ORDER — PANCREAZE 2600-8800 UNITS PO CPEP
1.0000 | ORAL_CAPSULE | ORAL | 3 refills | Status: DC
  Filled 2023-10-26: qty 100, 30d supply, fill #0
  Filled 2023-12-10: qty 100, 30d supply, fill #1

## 2023-11-01 ENCOUNTER — Other Ambulatory Visit: Payer: Self-pay

## 2023-11-01 ENCOUNTER — Other Ambulatory Visit (HOSPITAL_COMMUNITY): Payer: Self-pay

## 2023-12-08 ENCOUNTER — Other Ambulatory Visit (HOSPITAL_COMMUNITY): Payer: Self-pay

## 2023-12-08 ENCOUNTER — Other Ambulatory Visit: Payer: Self-pay

## 2023-12-08 ENCOUNTER — Encounter: Payer: Self-pay | Admitting: Hematology

## 2023-12-08 MED ORDER — TRAMADOL HCL 50 MG PO TABS
50.0000 mg | ORAL_TABLET | Freq: Four times a day (QID) | ORAL | 0 refills | Status: DC | PRN
  Filled 2023-12-08 – 2023-12-15 (×2): qty 56, 14d supply, fill #0

## 2023-12-11 ENCOUNTER — Other Ambulatory Visit (HOSPITAL_COMMUNITY): Payer: Self-pay

## 2023-12-11 ENCOUNTER — Other Ambulatory Visit: Payer: Self-pay

## 2023-12-13 ENCOUNTER — Other Ambulatory Visit (HOSPITAL_COMMUNITY): Payer: Self-pay

## 2023-12-15 ENCOUNTER — Other Ambulatory Visit (HOSPITAL_COMMUNITY): Payer: Self-pay

## 2023-12-15 ENCOUNTER — Other Ambulatory Visit: Payer: Self-pay

## 2023-12-15 MED ORDER — AMLODIPINE BESYLATE 5 MG PO TABS
5.0000 mg | ORAL_TABLET | Freq: Every morning | ORAL | 3 refills | Status: AC
  Filled 2023-12-15: qty 30, 30d supply, fill #0
  Filled 2024-01-27: qty 30, 30d supply, fill #1
  Filled 2024-03-09: qty 30, 30d supply, fill #2
  Filled 2024-04-24: qty 30, 30d supply, fill #3

## 2023-12-18 ENCOUNTER — Other Ambulatory Visit (HOSPITAL_COMMUNITY): Payer: Self-pay

## 2023-12-18 MED ORDER — AMLODIPINE BESYLATE 5 MG PO TABS
5.0000 mg | ORAL_TABLET | Freq: Every morning | ORAL | 3 refills | Status: DC
  Filled 2023-12-18: qty 30, 30d supply, fill #0

## 2023-12-19 ENCOUNTER — Other Ambulatory Visit (HOSPITAL_COMMUNITY): Payer: Self-pay

## 2024-01-11 ENCOUNTER — Other Ambulatory Visit (HOSPITAL_COMMUNITY): Payer: Self-pay

## 2024-01-11 ENCOUNTER — Other Ambulatory Visit: Payer: Self-pay

## 2024-01-11 MED ORDER — PROMETHAZINE HCL 25 MG PO TABS
25.0000 mg | ORAL_TABLET | Freq: Four times a day (QID) | ORAL | 3 refills | Status: AC | PRN
  Filled 2024-01-11: qty 120, 30d supply, fill #0
  Filled 2024-05-01: qty 120, 30d supply, fill #1

## 2024-01-11 MED ORDER — LORAZEPAM 0.5 MG PO TABS
0.5000 mg | ORAL_TABLET | Freq: Four times a day (QID) | ORAL | 3 refills | Status: AC | PRN
  Filled 2024-01-11: qty 120, 30d supply, fill #0

## 2024-01-11 MED ORDER — PANCREAZE 2600-8800 UNITS PO CPEP
1.0000 | ORAL_CAPSULE | ORAL | 3 refills | Status: DC
  Filled 2024-01-11: qty 90, 30d supply, fill #0
  Filled 2024-01-15: qty 100, 33d supply, fill #0

## 2024-01-15 ENCOUNTER — Other Ambulatory Visit: Payer: Self-pay

## 2024-01-15 ENCOUNTER — Encounter: Payer: Self-pay | Admitting: Hematology

## 2024-01-15 ENCOUNTER — Other Ambulatory Visit (HOSPITAL_COMMUNITY): Payer: Self-pay

## 2024-01-18 ENCOUNTER — Emergency Department (HOSPITAL_COMMUNITY)

## 2024-01-18 ENCOUNTER — Other Ambulatory Visit: Payer: Self-pay

## 2024-01-18 ENCOUNTER — Inpatient Hospital Stay (HOSPITAL_COMMUNITY)
Admission: EM | Admit: 2024-01-18 | Discharge: 2024-01-20 | DRG: 092 | Disposition: A | Discharge status: Hospice | Attending: Internal Medicine | Admitting: Internal Medicine

## 2024-01-18 ENCOUNTER — Encounter (HOSPITAL_COMMUNITY): Payer: Self-pay

## 2024-01-18 DIAGNOSIS — E876 Hypokalemia: Secondary | ICD-10-CM | POA: Diagnosis present

## 2024-01-18 DIAGNOSIS — R627 Adult failure to thrive: Secondary | ICD-10-CM | POA: Diagnosis present

## 2024-01-18 DIAGNOSIS — I480 Paroxysmal atrial fibrillation: Secondary | ICD-10-CM | POA: Diagnosis present

## 2024-01-18 DIAGNOSIS — I671 Cerebral aneurysm, nonruptured: Secondary | ICD-10-CM | POA: Diagnosis not present

## 2024-01-18 DIAGNOSIS — E039 Hypothyroidism, unspecified: Secondary | ICD-10-CM | POA: Diagnosis present

## 2024-01-18 DIAGNOSIS — H9190 Unspecified hearing loss, unspecified ear: Secondary | ICD-10-CM | POA: Diagnosis present

## 2024-01-18 DIAGNOSIS — Z8673 Personal history of transient ischemic attack (TIA), and cerebral infarction without residual deficits: Secondary | ICD-10-CM

## 2024-01-18 DIAGNOSIS — I6782 Cerebral ischemia: Secondary | ICD-10-CM | POA: Diagnosis not present

## 2024-01-18 DIAGNOSIS — T40425A Adverse effect of tramadol, initial encounter: Secondary | ICD-10-CM | POA: Diagnosis present

## 2024-01-18 DIAGNOSIS — R9431 Abnormal electrocardiogram [ECG] [EKG]: Secondary | ICD-10-CM | POA: Diagnosis not present

## 2024-01-18 DIAGNOSIS — Z811 Family history of alcohol abuse and dependence: Secondary | ICD-10-CM

## 2024-01-18 DIAGNOSIS — I10 Essential (primary) hypertension: Secondary | ICD-10-CM | POA: Diagnosis not present

## 2024-01-18 DIAGNOSIS — Z803 Family history of malignant neoplasm of breast: Secondary | ICD-10-CM

## 2024-01-18 DIAGNOSIS — N184 Chronic kidney disease, stage 4 (severe): Secondary | ICD-10-CM | POA: Diagnosis present

## 2024-01-18 DIAGNOSIS — Z7189 Other specified counseling: Secondary | ICD-10-CM | POA: Diagnosis not present

## 2024-01-18 DIAGNOSIS — C259 Malignant neoplasm of pancreas, unspecified: Secondary | ICD-10-CM | POA: Diagnosis present

## 2024-01-18 DIAGNOSIS — I5032 Chronic diastolic (congestive) heart failure: Secondary | ICD-10-CM | POA: Diagnosis present

## 2024-01-18 DIAGNOSIS — G4730 Sleep apnea, unspecified: Secondary | ICD-10-CM | POA: Diagnosis present

## 2024-01-18 DIAGNOSIS — Z66 Do not resuscitate: Secondary | ICD-10-CM | POA: Diagnosis present

## 2024-01-18 DIAGNOSIS — I700 Atherosclerosis of aorta: Secondary | ICD-10-CM | POA: Diagnosis not present

## 2024-01-18 DIAGNOSIS — N179 Acute kidney failure, unspecified: Secondary | ICD-10-CM | POA: Diagnosis present

## 2024-01-18 DIAGNOSIS — D469 Myelodysplastic syndrome, unspecified: Secondary | ICD-10-CM | POA: Diagnosis present

## 2024-01-18 DIAGNOSIS — Z6372 Alcoholism and drug addiction in family: Secondary | ICD-10-CM | POA: Diagnosis not present

## 2024-01-18 DIAGNOSIS — Z801 Family history of malignant neoplasm of trachea, bronchus and lung: Secondary | ICD-10-CM

## 2024-01-18 DIAGNOSIS — G928 Other toxic encephalopathy: Principal | ICD-10-CM | POA: Diagnosis present

## 2024-01-18 DIAGNOSIS — I130 Hypertensive heart and chronic kidney disease with heart failure and stage 1 through stage 4 chronic kidney disease, or unspecified chronic kidney disease: Secondary | ICD-10-CM | POA: Diagnosis present

## 2024-01-18 DIAGNOSIS — M1990 Unspecified osteoarthritis, unspecified site: Secondary | ICD-10-CM | POA: Diagnosis present

## 2024-01-18 DIAGNOSIS — I6521 Occlusion and stenosis of right carotid artery: Secondary | ICD-10-CM | POA: Diagnosis not present

## 2024-01-18 DIAGNOSIS — Z79890 Hormone replacement therapy (postmenopausal): Secondary | ICD-10-CM | POA: Diagnosis not present

## 2024-01-18 DIAGNOSIS — R4182 Altered mental status, unspecified: Secondary | ICD-10-CM | POA: Diagnosis present

## 2024-01-18 DIAGNOSIS — R4781 Slurred speech: Secondary | ICD-10-CM | POA: Diagnosis not present

## 2024-01-18 DIAGNOSIS — R569 Unspecified convulsions: Secondary | ICD-10-CM | POA: Diagnosis not present

## 2024-01-18 DIAGNOSIS — R54 Age-related physical debility: Secondary | ICD-10-CM | POA: Diagnosis present

## 2024-01-18 DIAGNOSIS — Z79899 Other long term (current) drug therapy: Secondary | ICD-10-CM

## 2024-01-18 DIAGNOSIS — E785 Hyperlipidemia, unspecified: Secondary | ICD-10-CM | POA: Diagnosis present

## 2024-01-18 DIAGNOSIS — I6502 Occlusion and stenosis of left vertebral artery: Secondary | ICD-10-CM | POA: Diagnosis not present

## 2024-01-18 DIAGNOSIS — G9341 Metabolic encephalopathy: Secondary | ICD-10-CM | POA: Diagnosis present

## 2024-01-18 DIAGNOSIS — I7121 Aneurysm of the ascending aorta, without rupture: Secondary | ICD-10-CM | POA: Diagnosis present

## 2024-01-18 DIAGNOSIS — G9389 Other specified disorders of brain: Secondary | ICD-10-CM | POA: Diagnosis not present

## 2024-01-18 DIAGNOSIS — G459 Transient cerebral ischemic attack, unspecified: Secondary | ICD-10-CM | POA: Diagnosis not present

## 2024-01-18 DIAGNOSIS — Z8507 Personal history of malignant neoplasm of pancreas: Secondary | ICD-10-CM

## 2024-01-18 DIAGNOSIS — R42 Dizziness and giddiness: Secondary | ICD-10-CM | POA: Diagnosis not present

## 2024-01-18 DIAGNOSIS — R000 Tachycardia, unspecified: Secondary | ICD-10-CM | POA: Diagnosis not present

## 2024-01-18 DIAGNOSIS — G9340 Encephalopathy, unspecified: Secondary | ICD-10-CM

## 2024-01-18 DIAGNOSIS — Z90411 Acquired partial absence of pancreas: Secondary | ICD-10-CM

## 2024-01-18 DIAGNOSIS — Z9049 Acquired absence of other specified parts of digestive tract: Secondary | ICD-10-CM | POA: Diagnosis not present

## 2024-01-18 DIAGNOSIS — Z7401 Bed confinement status: Secondary | ICD-10-CM | POA: Diagnosis not present

## 2024-01-18 DIAGNOSIS — Z87891 Personal history of nicotine dependence: Secondary | ICD-10-CM

## 2024-01-18 DIAGNOSIS — I6523 Occlusion and stenosis of bilateral carotid arteries: Secondary | ICD-10-CM | POA: Diagnosis not present

## 2024-01-18 DIAGNOSIS — A419 Sepsis, unspecified organism: Secondary | ICD-10-CM | POA: Diagnosis not present

## 2024-01-18 DIAGNOSIS — Z9660 Presence of unspecified orthopedic joint implant: Secondary | ICD-10-CM | POA: Diagnosis present

## 2024-01-18 LAB — CBC WITH DIFFERENTIAL/PLATELET
Abs Immature Granulocytes: 0.04 10*3/uL (ref 0.00–0.07)
Basophils Absolute: 0 10*3/uL (ref 0.0–0.1)
Basophils Relative: 0 %
Eosinophils Absolute: 0.1 10*3/uL (ref 0.0–0.5)
Eosinophils Relative: 1 %
HCT: 33.3 % — ABNORMAL LOW (ref 39.0–52.0)
Hemoglobin: 10.5 g/dL — ABNORMAL LOW (ref 13.0–17.0)
Immature Granulocytes: 0 %
Lymphocytes Relative: 53 %
Lymphs Abs: 5.3 10*3/uL — ABNORMAL HIGH (ref 0.7–4.0)
MCH: 24.4 pg — ABNORMAL LOW (ref 26.0–34.0)
MCHC: 31.5 g/dL (ref 30.0–36.0)
MCV: 77.3 fL — ABNORMAL LOW (ref 80.0–100.0)
Monocytes Absolute: 0.9 10*3/uL (ref 0.1–1.0)
Monocytes Relative: 8 %
Neutro Abs: 3.9 10*3/uL (ref 1.7–7.7)
Neutrophils Relative %: 38 %
Platelets: 145 10*3/uL — ABNORMAL LOW (ref 150–400)
RBC: 4.31 MIL/uL (ref 4.22–5.81)
RDW: 16.6 % — ABNORMAL HIGH (ref 11.5–15.5)
WBC: 10.3 10*3/uL (ref 4.0–10.5)
nRBC: 0 % (ref 0.0–0.2)

## 2024-01-18 LAB — LACTIC ACID, PLASMA: Lactic Acid, Venous: 1 mmol/L (ref 0.5–1.9)

## 2024-01-18 LAB — URINALYSIS, ROUTINE W REFLEX MICROSCOPIC
Bacteria, UA: NONE SEEN
Bilirubin Urine: NEGATIVE
Glucose, UA: NEGATIVE mg/dL
Ketones, ur: 5 mg/dL — AB
Nitrite: NEGATIVE
Protein, ur: NEGATIVE mg/dL
Specific Gravity, Urine: 1.031 — ABNORMAL HIGH (ref 1.005–1.030)
pH: 5 (ref 5.0–8.0)

## 2024-01-18 LAB — I-STAT CHEM 8, ED
BUN: 17 mg/dL (ref 8–23)
Calcium, Ion: 1.09 mmol/L — ABNORMAL LOW (ref 1.15–1.40)
Chloride: 105 mmol/L (ref 98–111)
Creatinine, Ser: 1.8 mg/dL — ABNORMAL HIGH (ref 0.61–1.24)
Glucose, Bld: 136 mg/dL — ABNORMAL HIGH (ref 70–99)
HCT: 33 % — ABNORMAL LOW (ref 39.0–52.0)
Hemoglobin: 11.2 g/dL — ABNORMAL LOW (ref 13.0–17.0)
Potassium: 3.2 mmol/L — ABNORMAL LOW (ref 3.5–5.1)
Sodium: 137 mmol/L (ref 135–145)
TCO2: 17 mmol/L — ABNORMAL LOW (ref 22–32)

## 2024-01-18 LAB — COMPREHENSIVE METABOLIC PANEL WITH GFR
ALT: 14 U/L (ref 0–44)
AST: 33 U/L (ref 15–41)
Albumin: 4.1 g/dL (ref 3.5–5.0)
Alkaline Phosphatase: 109 U/L (ref 38–126)
Anion gap: 18 — ABNORMAL HIGH (ref 5–15)
BUN: 20 mg/dL (ref 8–23)
CO2: 17 mmol/L — ABNORMAL LOW (ref 22–32)
Calcium: 9.5 mg/dL (ref 8.9–10.3)
Chloride: 102 mmol/L (ref 98–111)
Creatinine, Ser: 1.8 mg/dL — ABNORMAL HIGH (ref 0.61–1.24)
GFR, Estimated: 37 mL/min — ABNORMAL LOW (ref >60)
Glucose, Bld: 139 mg/dL — ABNORMAL HIGH (ref 70–99)
Potassium: 3.2 mmol/L — ABNORMAL LOW (ref 3.5–5.1)
Sodium: 137 mmol/L (ref 135–145)
Total Bilirubin: 0.8 mg/dL (ref 0.0–1.2)
Total Protein: 7.9 g/dL (ref 6.5–8.1)

## 2024-01-18 LAB — BLOOD GAS, VENOUS
Acid-base deficit: 0.7 mmol/L (ref 0.0–2.0)
Bicarbonate: 20.2 mmol/L (ref 20.0–28.0)
O2 Saturation: 83.1 %
Patient temperature: 37
pCO2, Ven: 22 mmHg — ABNORMAL LOW (ref 44–60)
pH, Ven: 7.57 — ABNORMAL HIGH (ref 7.25–7.43)
pO2, Ven: 45 mmHg (ref 32–45)

## 2024-01-18 LAB — RAPID URINE DRUG SCREEN, HOSP PERFORMED
Amphetamines: NOT DETECTED
Barbiturates: NOT DETECTED
Benzodiazepines: NOT DETECTED
Cocaine: NOT DETECTED
Opiates: NOT DETECTED
Tetrahydrocannabinol: NOT DETECTED

## 2024-01-18 LAB — PROTIME-INR
INR: 1.3 — ABNORMAL HIGH (ref 0.8–1.2)
Prothrombin Time: 16 s — ABNORMAL HIGH (ref 11.4–15.2)

## 2024-01-18 LAB — PHOSPHORUS: Phosphorus: 2.1 mg/dL — ABNORMAL LOW (ref 2.5–4.6)

## 2024-01-18 LAB — PROCALCITONIN: Procalcitonin: <0.1 ng/mL

## 2024-01-18 LAB — MAGNESIUM: Magnesium: 2.3 mg/dL (ref 1.7–2.4)

## 2024-01-18 LAB — TSH: TSH: 0.444 u[IU]/mL (ref 0.350–4.500)

## 2024-01-18 LAB — CBG MONITORING, ED: Glucose-Capillary: 145 mg/dL — ABNORMAL HIGH (ref 70–99)

## 2024-01-18 LAB — AMMONIA: Ammonia: 14 umol/L (ref 9–35)

## 2024-01-18 MED ORDER — POTASSIUM CHLORIDE 10 MEQ/100ML IV SOLN
10.0000 meq | Freq: Once | INTRAVENOUS | Status: AC
  Administered 2024-01-19: 10 meq via INTRAVENOUS
  Filled 2024-01-18: qty 100

## 2024-01-18 MED ORDER — HYDRALAZINE HCL 20 MG/ML IJ SOLN
5.0000 mg | Freq: Once | INTRAMUSCULAR | Status: AC
  Administered 2024-01-18: 5 mg via INTRAVENOUS
  Filled 2024-01-18: qty 1

## 2024-01-18 MED ORDER — POTASSIUM PHOSPHATES 15 MMOLE/5ML IV SOLN
15.0000 mmol | Freq: Once | INTRAVENOUS | Status: AC
  Administered 2024-01-19: 15 mmol via INTRAVENOUS
  Filled 2024-01-18: qty 5

## 2024-01-18 MED ORDER — THIAMINE HCL 100 MG/ML IJ SOLN
100.0000 mg | Freq: Every day | INTRAMUSCULAR | Status: DC
  Administered 2024-01-19 – 2024-01-20 (×3): 100 mg via INTRAVENOUS
  Filled 2024-01-18 (×3): qty 2

## 2024-01-18 MED ORDER — LORAZEPAM 2 MG/ML IJ SOLN
1.0000 mg | Freq: Once | INTRAMUSCULAR | Status: AC
  Administered 2024-01-18: 1 mg via INTRAVENOUS
  Filled 2024-01-18: qty 1

## 2024-01-18 MED ORDER — LORAZEPAM 2 MG/ML IJ SOLN
2.0000 mg | Freq: Once | INTRAMUSCULAR | Status: AC
  Administered 2024-01-18: 2 mg via INTRAVENOUS
  Filled 2024-01-18: qty 1

## 2024-01-18 MED ORDER — POTASSIUM CHLORIDE 10 MEQ/100ML IV SOLN: 10.0000 meq | INTRAVENOUS | Status: DC

## 2024-01-18 MED ORDER — IOHEXOL 350 MG/ML SOLN
60.0000 mL | Freq: Once | INTRAVENOUS | Status: AC | PRN
  Administered 2024-01-18: 60 mL via INTRAVENOUS

## 2024-01-18 NOTE — Assessment & Plan Note (Signed)
 Check TSH

## 2024-01-18 NOTE — Assessment & Plan Note (Signed)
History of embolization

## 2024-01-18 NOTE — ED Triage Notes (Addendum)
 Pt came in for slurred speech that started 30 minutes ago and dizziness that's lasted all day. Pt will not keep his eyes open and it altered upon arrival. Pt only yells out during painful stimuli. Pt was also banging his hands around the dashboard.

## 2024-01-18 NOTE — ED Notes (Signed)
 Provider verbally ordered an in-and-out on pt for urinalysis. Pt was prepped and cleaned for such but then became alert enough to provider urine sample via clean catch.

## 2024-01-18 NOTE — H&P (Signed)
 Angel Keller YNW:[id number] DOB: 09-23-1940 DOA: 01/18/2024     PCP: Sharyon Deis, NP   Outpatient Specialists:   CARDS:  Dr. Sheryle Donning, MD    Oncology  Dr. Maryalice Smaller    Patient arrived to ER on 01/18/24 at 1743 Referred by Attending Kommor, Alyse July, MD   Patient coming from:    home Lives   With family      Chief Complaint:   Chief Complaint  Patient presents with   Aphasia    HPI: [name]. is a 83 y.o. male with medical history significant of pancreatic cancer status post Whipple procedure, stroke myelodysplastic syndrome, failure to thrive, hypertension    Presented with confusion and slurred speech Patient presented with slurred speech since started around 5 PM today patient has been having some dizziness lasting all day appears to be altered on arrival   Pancreatic cancer has been in remission, but has been enrolled in hospice, yesterday started a small dose of ultram  Had a sudden onset of severe encephalopathy  Denies significant ETOH intake   Does not smoke       Regarding pertinent Chronic problems:     Hyperlipidemia -  on statins Lipitor (atorvastatin )  Lipid Panel     Component Value Date/Time   CHOL 130 09/17/2022 0258   TRIG 49 09/17/2022 0258   TRIG 150 (H) 07/11/2006 0731   HDL 59 09/17/2022 0258   CHOLHDL 2.2 09/17/2022 0258   VLDL 10 09/17/2022 0258   LDLCALC 61 09/17/2022 0258   LDLDIRECT 149.7 11/07/2008 0000     HTN on norvasc    chronic CHF diastolic  - last echo  Recent Results (from the past 65784 hours)  ECHOCARDIOGRAM COMPLETE   Collection Time: 08/28/21 11:51 AM  Result Value   Weight 2,384   Height 69   BP 131/74   S' Lateral 3.40   AR max vel 2.97   AV Area VTI 2.71   AV Mean grad 4.0   AV Peak grad 8.4   Ao pk vel 1.45   Area-P 1/2 2.87   AV Area mean vel 2.77   Narrative      ECHOCARDIOGRAM REPORT       Patient Name:   Angel DEMMER Sr. Date of Exam: 08/28/2021 Medical Rec #:  [id number]          Height:       69.0 in Accession #:    [id number]        Weight:       149.0 lb Date of Birth:  [birth year]         BSA:          1.823 m Patient Age:    80 years          BP:           117/61 mmHg Patient Gender: M                 HR:           63 bpm. Exam Location:  Inpatient  Procedure: 2D Echo  Indications:    Atrial fibrillation   History:        Patient has no prior history of Echocardiogram examinations.                 Risk Factors:Dyslipidemia and Hypertension. Dilated thoracic                 ascending aorta.  Sonographer:    Isabella Mao Referring Phys: [id number] BRADLEY S CHOTINER  IMPRESSIONS    1. Left ventricular ejection fraction, by estimation, is 60 to 65%. The left ventricle has normal function. The left ventricle has no regional wall motion abnormalities. Left ventricular diastolic parameters are consistent with Grade I diastolic  dysfunction (impaired relaxation).  2. Right ventricular systolic function is normal. The right ventricular size is normal. There is normal pulmonary artery systolic pressure.  3. Left atrial size was mildly dilated.  4. Right atrial size was mildly dilated.  5. The mitral valve is grossly normal. Trivial mitral valve regurgitation.  6. The aortic valve is tricuspid. There is mild calcification of the aortic valve. There is mild thickening of the aortic valve. Aortic valve regurgitation is not visualized. Aortic valve sclerosis/calcification is present, without any evidence of  aortic stenosis.  7. The inferior vena cava is normal in size with greater than 50% respiratory variability, suggesting right atrial pressure of 3 mmHg.             Hx of CVA -  with/out residual deficits no longer on aspirin     Chronic anemia - baseline hg Hemoglobin & Hematocrit  Recent Labs    03/28/23 1059 01/18/24 1757 01/18/24 1803  HGB 9.1* 10.5* 11.2*   Iron/TIBC/Ferritin/ %Sat    Component Value Date/Time   IRON 25 (L) 01/20/2022 0839    TIBC 434 01/20/2022 0839   FERRITIN 9 (L) 01/20/2022 0859   IRONPCTSAT 6 (L) 01/20/2022 0839     Cancer: pancreatic cancer     While in ER:     CTA non acute    Lab Orders         Comprehensive metabolic panel         CBC with Differential         Ammonia         Blood gas, venous (at [facility] and [facility])         Urinalysis, Routine w reflex microscopic -Urine, Clean Catch         Rapid urine drug screen (hospital performed)         TSH         I-stat chem 8, ED (not at [facility], [facility] or [facility])         CBG monitoring, ED      CT HEAD Generalized cerebral atrophy and microvascular disease   Right MCA stent.   CTA 1. No emergent large vessel occlusion. 2. Patent right M1 MCA stent. Suspected narrowing along the mid to distal portions of the stent, not well quantified by CTA. Catheter arteriogram could further evaluate if clinically warranted. 3. 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 4. Moderate left V2 vertebral artery stenosis.  MRI brain  orderd     CT chest -abd/pelvis -   Constipation  Following Medications were ordered in ER: Medications  hydrALAZINE  (APRESOLINE ) injection 5 mg (has no administration in time range)  LORazepam  (ATIVAN ) injection 1 mg (1 mg Intravenous Given 01/18/24 1810)  iohexol  (OMNIPAQUE ) 350 MG/ML injection 60 mL (60 mLs Intravenous Contrast Given 01/18/24 1918)  LORazepam  (ATIVAN ) injection 2 mg (2 mg Intravenous Given 01/18/24 1900)    _______________________________________________________ ER Provider Called:      Neurology  Dr. Nancee Awe They Recommend admit to medicine  transfer to [facility] Will see in AM      ED Triage Vitals  Encounter Vitals Group     BP 01/18/24 1815 (!) 166/74  Systolic BP Percentile --      Diastolic BP Percentile --      Pulse Rate 01/18/24 1800 93     Resp 01/18/24 1800 17     Temp 01/18/24 2133 (!) 97.2 F (36.2 C)     Temp Source 01/18/24 2133 Axillary     SpO2 01/18/24 1800 100 %     Weight --       Height --      Head Circumference --      Peak Flow --      Pain Score 01/18/24 2004 Asleep     Pain Loc --      Pain Education --      Exclude from Growth Chart --   ZOXW(96)@     _________________________________________ Significant initial  Findings: Abnormal Labs Reviewed  COMPREHENSIVE METABOLIC PANEL WITH GFR - Abnormal; Notable for the following components:      Result Value   Potassium 3.2 (*)    CO2 17 (*)    Glucose, Bld 139 (*)    Creatinine, Ser 1.80 (*)    GFR, Estimated 37 (*)    Anion gap 18 (*)    All other components within normal limits  CBC WITH DIFFERENTIAL/PLATELET - Abnormal; Notable for the following components:   Hemoglobin 10.5 (*)    HCT 33.3 (*)    MCV 77.3 (*)    MCH 24.4 (*)    RDW 16.6 (*)    Platelets 145 (*)    All other components within normal limits  BLOOD GAS, VENOUS - Abnormal; Notable for the following components:   pH, Ven 7.57 (*)    pCO2, Ven 22 (*)    All other components within normal limits  URINALYSIS, ROUTINE W REFLEX MICROSCOPIC - Abnormal; Notable for the following components:   Specific Gravity, Urine 1.031 (*)    Hgb urine dipstick SMALL (*)    Ketones, ur 5 (*)    Leukocytes,Ua TRACE (*)    All other components within normal limits  I-STAT CHEM 8, ED - Abnormal; Notable for the following components:   Potassium 3.2 (*)    Creatinine, Ser 1.80 (*)    Glucose, Bld 136 (*)    Calcium , Ion 1.09 (*)    TCO2 17 (*)    Hemoglobin 11.2 (*)    HCT 33.0 (*)    All other components within normal limits  CBG MONITORING, ED - Abnormal; Notable for the following components:   Glucose-Capillary 145 (*)    All other components within normal limits      Ck ordered    ECG: Ordered Personally reviewed and interpreted by me showing: HR : 102 Rhythm: Sinus tachycardia Probable left atrial enlargement Borderline left axis deviation Abnormal R-wave progression, early transition Borderline repolarization abnormality Prolonged  QT interval QTC 563  BNP (last 3 results) Recent Labs    02/20/23 1800  BNP 106.0*      No results for input(s): "DDIMER", "FERRITIN", "LDH", "CRP" in the last 72 hours.      The recent clinical data is shown below. Vitals:   01/18/24 1800 01/18/24 1815 01/18/24 2133  BP:  (!) 166/74 (!) 184/120  Pulse: 93 82 84  Resp: 17 17 15   Temp:   (!) 97.2 F (36.2 C)  TempSrc:   Axillary  SpO2: 100% 98% 97%    WBC     Component Value Date/Time   WBC 10.3 01/18/2024 1757   LYMPHSABS PENDING 01/18/2024 1757   MONOABS  PENDING 01/18/2024 1757   EOSABS PENDING 01/18/2024 1757   BASOSABS PENDING 01/18/2024 1757    Lactic Acid, Venous    Component Value Date/Time   LATICACIDVEN 1.0 01/18/2024 2221     Procalcitonin <0.10      UA   no evidence of UTI     Urine analysis:    Component Value Date/Time   COLORURINE YELLOW 01/18/2024 2035   APPEARANCEUR CLEAR 01/18/2024 2035   LABSPEC 1.031 (H) 01/18/2024 2035   PHURINE 5.0 01/18/2024 2035   GLUCOSEU NEGATIVE 01/18/2024 2035   GLUCOSEU NEGATIVE 11/07/2008 0000   HGBUR SMALL (A) 01/18/2024 2035   BILIRUBINUR NEGATIVE 01/18/2024 2035   KETONESUR 5 (A) 01/18/2024 2035   PROTEINUR NEGATIVE 01/18/2024 2035   UROBILINOGEN 1.0 07/09/2009 2019   NITRITE NEGATIVE 01/18/2024 2035   LEUKOCYTESUR TRACE (A) 01/18/2024 2035        __________________________________________________________ Recent Labs  Lab 01/18/24 1757 01/18/24 1803  NA 137 137  K 3.2* 3.2*  CO2 17*  --   GLUCOSE 139* 136*  BUN 20 17  CREATININE 1.80* 1.80*  CALCIUM  9.5  --     Cr  Up from baseline see below Lab Results  Component Value Date   CREATININE 1.80 (H) 01/18/2024   CREATININE 1.80 (H) 01/18/2024   CREATININE 1.15 03/28/2023    Recent Labs  Lab 01/18/24 1757  AST 33  ALT 14  ALKPHOS 109  BILITOT 0.8  PROT 7.9  ALBUMIN  4.1   Lab Results  Component Value Date   CALCIUM  9.5 01/18/2024   PHOS 3.9 02/21/2023    Plt: Lab Results   Component Value Date   PLT 145 (L) 01/18/2024       Recent Labs  Lab 01/18/24 1757 01/18/24 1803  WBC 10.3  --   NEUTROABS PENDING  --   HGB 10.5* 11.2*  HCT 33.3* 33.0*  MCV 77.3*  --   PLT 145*  --     HG/HCT   stable,     Component Value Date/Time   HGB 11.2 (L) 01/18/2024 1803   HGB 9.1 (L) 03/28/2023 1059   HGB 13.2 07/27/2021 1627   HCT 33.0 (L) 01/18/2024 1803   HCT 39.8 07/27/2021 1627   MCV 77.3 (L) 01/18/2024 1757   MCV 82 07/27/2021 1627      No results for input(s): "LIPASE", "AMYLASE" in the last 168 hours. Recent Labs  Lab 01/18/24 1800  AMMONIA 14      _______________________________________________ Hospitalist was called for admission for acute encephalopathy   The following Work up has been ordered so far:  Orders Placed This Encounter  Procedures   CT Head Wo Contrast   CT ANGIO HEAD NECK W WO CM   CT CHEST ABDOMEN PELVIS WO CONTRAST   MR BRAIN WO CONTRAST   Comprehensive metabolic panel   CBC with Differential   Ammonia   Blood gas, venous (at [facility] and [facility])   Urinalysis, Routine w reflex microscopic -Urine, Clean Catch   Rapid urine drug screen (hospital performed)   TSH   Activate Code Medical.  Code Medical Patients are identified by an ED Provider and need the prioritization of an emergent CT scan and must be transported by a RN with a monitor.   Consult to hospitalist   I-stat chem 8, ED (not at [facility], [facility] or [facility])   CBG monitoring, ED   EKG 12-Lead     OTHER Significant initial  Findings:  labs showing:     DM  labs:  HbA1C: No results for input(s): "HGBA1C" in the last 8760 hours.     CBG (last 3)  Recent Labs    01/18/24 1756  GLUCAP 145*    Cultures:    Component Value Date/Time   SDES  02/20/2023 1800    BLOOD RIGHT ANTECUBITAL Performed at [facility], [address], [place], [place] [address]    SDES  02/20/2023 1800    BLOOD LEFT ANTECUBITAL Performed at [facility], [address] [place], [place] [address]    SPECREQUEST  02/20/2023 1800    BOTTLES DRAWN AEROBIC AND ANAEROBIC Blood Culture adequate volume Performed at [facility], [address] [place], [place] [address]    SPECREQUEST  02/20/2023 1800    BOTTLES DRAWN AEROBIC ONLY Blood Culture adequate volume Performed at [facility], [address], [place], [place] [address]    CULT (A) 02/20/2023 1800    STREPTOCOCCUS INTERMEDIUS THE SIGNIFICANCE OF ISOLATING THIS ORGANISM FROM A SINGLE SET OF BLOOD CULTURES WHEN MULTIPLE SETS ARE DRAWN IS UNCERTAIN. PLEASE NOTIFY THE MICROBIOLOGY DEPARTMENT WITHIN ONE WEEK IF SPECIATION AND SENSITIVITIES ARE REQUIRED. Performed at [facility], [address], [place], [place] [address]    CULT  02/20/2023 1800    NO GROWTH 5 DAYS Performed at [facility], [address], [place], [place] [address]    REPTSTATUS 02/26/2023 FINAL 02/20/2023 1800   REPTSTATUS 02/25/2023 FINAL 02/20/2023 1800     Radiological Exams on Admission: CT ANGIO HEAD NECK W WO CM Result Date: 01/18/2024 CLINICAL DATA:  Stroke/TIA, determine embolic source EXAM: CT ANGIOGRAPHY HEAD AND NECK WITH AND WITHOUT CONTRAST TECHNIQUE: Multidetector CT imaging of the head and neck was performed using the standard protocol during bolus administration of intravenous contrast. Multiplanar CT image reconstructions and MIPs were obtained to evaluate the vascular anatomy. Carotid stenosis measurements (when applicable) are obtained utilizing NASCET criteria, using the distal internal carotid diameter as the denominator. RADIATION DOSE REDUCTION: This exam was performed according to the departmental dose-optimization program which includes automated exposure control, adjustment of the mA and/or kV according to patient size and/or use of iterative reconstruction technique. CONTRAST:  60mL OMNIPAQUE  IOHEXOL  350 MG/ML SOLN COMPARISON:  Same day  CT head. FINDINGS: CTA NECK FINDINGS Aortic arch: Great vessel origins are not fully imaged. Aortic atherosclerosis. Right carotid system: Atherosclerosis at the carotid bifurcation and involving the proximal ICA with approximately 50% stenosis of the proximal ICA. Left carotid system: The common carotid artery origin is not imaged. No visible significant (greater than 50% stenosis). Vertebral arteries: Codominant. Patent bilaterally. Moderate left V2 vertebral artery stenosis. Skeleton: No evidence of acute abnormality on limited assessment. Multilevel degenerative change in the cervical spine. Other neck: No acute abnormality on limited assessment. Upper chest: Visualized lung apices are clear. Review of the MIP images confirms the above findings CTA HEAD FINDINGS Anterior circulation: Bilateral intracranial ICAs, MCAs, and ACAs are patent. There is a stent within the right M1 MCA which may be narrowed along its mid to distal portion but remains patent. Posterior circulation: Bilateral intradural vertebral arteries, basilar artery and bilateral posterior cerebral arteries are patent without proximal hemodynamically significant stenosis. Venous sinuses: As permitted by contrast timing, patent. Review of the MIP images confirms the above findings IMPRESSION: 1. No emergent large vessel occlusion. 2. Patent right M1 MCA stent. Suspected narrowing along the mid to distal portions of the stent, not well quantified by CTA.  Catheter arteriogram could further evaluate if clinically warranted. 3. 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 4. Moderate left V2 vertebral artery stenosis. 5. Aortic Atherosclerosis (ICD10-I70.0). Electronically Signed   By: Stevenson Elbe M.D.   On: 01/18/2024 21:05   CT CHEST ABDOMEN PELVIS WO CONTRAST Result Date: 01/18/2024 EXAM: CT CHEST, ABDOMEN AND PELVIS WITHOUT CONTRAST 01/18/2024 07:19:18 PM TECHNIQUE: CT of the chest, abdomen and pelvis was performed without the  administration of intravenous contrast. Multiplanar reformatted images are provided for review. Automated exposure control, iterative reconstruction, and/or weight based adjustment of the mA/kV was utilized to reduce the radiation dose to as low as reasonably achievable. COMPARISON: MR abdomen dated 02/13/2023 and CT chest abdomen and pelvis dated 12/19/2022. CLINICAL HISTORY: Sepsis. FINDINGS: CHEST: MEDIASTINUM: Right chest port terminates in the mid SVC. Moderate 3-vessel coronary atherosclerosis. Thoracic aortic atherosclerosis. THORACIC LYMPH NODES: No mediastinal, hilar or axillary lymphadenopathy. LUNGS AND PLEURA: Faint subpleural ground-glass opacities in the lungs bilaterally (for example, image 72), suggesting postinfectious/inflammatory scarring. No pleural effusion or pneumothorax. ABDOMEN AND PELVIS: LIVER: No intrahepatic duct dilatation. Mild pneumobilia. GALLBLADDER AND BILE DUCTS: Status post cholecystectomy with choledojejunostomy. Associated mild pneumobilia. SPLEEN: No acute abnormality. PANCREAS: Status post partial pancreatectomy with pancreaticojejunal anastomosis. No mass/abnormality of the residual pancreas. ADRENAL GLANDS: No acute abnormality. KIDNEYS, URETERS AND BLADDER: No stones in the kidneys or ureters. No hydronephrosis. No perinephric or periureteral stranding. Urinary bladder is unremarkable. GI AND BOWEL: Status post distal gastrectomy with patent gastrojejunostomy. No evidence of bowel obstruction. Normal appendix (image 90). Moderate rectal stool burden (image 103), suggesting mild fecal impaction, without stercoral colitis. Mild sigmoid diverticulosis, without evidence of diverticulitis. REPRODUCTIVE ORGANS: No acute abnormality. PERITONEUM AND RETROPERITONEUM: No ascites. No free air. VASCULATURE: Atherosclerotic calcifications of the abdominal aorta and branch vessels. ABDOMINAL AND PELVIS LYMPH NODES: No lymphadenopathy. REPRODUCTIVE ORGANS: No acute abnormality. BONES AND  SOFT TISSUES: Moderate degenerative changes of the visualized thoracolumbar spine. No focal soft tissue abnormality. IMPRESSION: 1. Postsurgical changes related to whipple procedure. No evidence of recurrent or metastatic disease. 2. Suspected mild fecal impaction, without stercoral colitis. 3. Additional ancillary findings as above. Electronically signed by: Zadie Herter MD 01/18/2024 07:42 PM EDT RP Workstation: [id number]   CT Head Wo Contrast Result Date: 01/18/2024 CLINICAL DATA:  Dizziness. EXAM: CT HEAD WITHOUT CONTRAST TECHNIQUE: Contiguous axial images were obtained from the base of the skull through the vertex without intravenous contrast. RADIATION DOSE REDUCTION: This exam was performed according to the departmental dose-optimization program which includes automated exposure control, adjustment of the mA and/or kV according to patient size and/or use of iterative reconstruction technique. COMPARISON:  February 17, 2023 FINDINGS: Brain: There is generalized cerebral atrophy with widening of the extra-axial spaces and ventricular dilatation. There are areas of decreased attenuation within the white matter tracts of the supratentorial brain, consistent with microvascular disease changes. A small chronic right occipital lobe infarct is noted. Vascular: Marked severity bilateral cavernous carotid artery calcification is noted. A right MCA stent is in place. Skull: Normal. Negative for fracture or focal lesion. Sinuses/Orbits: No acute finding. Other: None. IMPRESSION: 1. Generalized cerebral atrophy and microvascular disease changes of the supratentorial brain. 2. Small chronic right occipital lobe infarct. 3. No acute intracranial abnormality. 4. Right MCA stent. Electronically Signed   By: Virgle Grime M.D.   On: 01/18/2024 18:45   _______________________________________________________________________________________________________ Latest  Blood pressure (!) 184/120, pulse 84, temperature (!)  97.2 F (36.2 C), temperature source Axillary, resp. rate 15, SpO2 97%.  Vitals  labs and radiology finding personally reviewed  Review of Systems:    Pertinent positives include:  fatigue, confusion  Constitutional:  No weight loss, night sweats, Fevers, chills, weight loss  HEENT:  No headaches, Difficulty swallowing,Tooth/dental problems,Sore throat,  No sneezing, itching, ear ache, nasal congestion, post nasal drip,  Cardio-vascular:  No chest pain, Orthopnea, PND, anasarca, dizziness, palpitations.no Bilateral lower extremity swelling  GI:  No heartburn, indigestion, abdominal pain, nausea, vomiting, diarrhea, change in bowel habits, loss of appetite, melena, blood in stool, hematemesis Resp:  no shortness of breath at rest. No dyspnea on exertion, No excess mucus, no productive cough, No non-productive cough, No coughing up of blood.No change in color of mucus.No wheezing. Skin:  no rash or lesions. No jaundice GU:  no dysuria, change in color of urine, no urgency or frequency. No straining to urinate.  No flank pain.  Musculoskeletal:  No joint pain or no joint swelling. No decreased range of motion. No back pain.  Psych:  No change in mood or affect. No depression or anxiety. No memory loss.  Neuro: no localizing neurological complaints, no tingling, no weakness, no double vision, no gait abnormality, no slurred speech, no   All systems reviewed and apart from HOPI all are negative _______________________________________________________________________________________________ Past Medical History:   Past Medical History:  Diagnosis Date   Anxiety    Arthritis    Depression    Family history of breast cancer    Family history of lung cancer    Headache    HLD (hyperlipidemia)    Hx of inguinal hernia repair    Hypertension    Hypothyroidism    Nausea with vomiting 09/21/2021   Overactive bladder    Pancreatic cancer (HCC)    Pneumonia    Shingles    Sleep  apnea    Patient denies   Thoracic ascending aortic aneurysm (HCC)    mildly dilated 4.0cm per 08/12/21 CT   Thyroid  disease    Varicose veins of both lower extremities       Past Surgical History:  Procedure Laterality Date   BILATERAL CARPAL TUNNEL RELEASE     CATARACT EXTRACTION, BILATERAL Bilateral    ELBOW SURGERY Bilateral    ENDOSCOPIC RETROGRADE CHOLANGIOPANCREATOGRAPHY (ERCP) WITH PROPOFOL  N/A 07/20/2021   Procedure: ENDOSCOPIC RETROGRADE CHOLANGIOPANCREATOGRAPHY (ERCP) WITH PROPOFOL ;  Surgeon: Kenney Peacemaker, MD;  Location: [facility] ENDOSCOPY;  Service: Endoscopy;  Laterality: N/A;   ESOPHAGOGASTRODUODENOSCOPY (EGD) WITH PROPOFOL  N/A 08/05/2021   Procedure: ESOPHAGOGASTRODUODENOSCOPY (EGD) WITH PROPOFOL ;  Surgeon: Brice Campi Albino Alu., MD;  Location: [facility] ENDOSCOPY;  Service: Gastroenterology;  Laterality: N/A;   EUS N/A 08/05/2021   Procedure: UPPER ENDOSCOPIC ULTRASOUND (EUS) RADIAL;  Surgeon: Normie Becton., MD;  Location: [facility] ENDOSCOPY;  Service: Gastroenterology;  Laterality: N/A;   EYE SURGERY     bilateral cataracts   FINE NEEDLE ASPIRATION  08/05/2021   Procedure: FINE NEEDLE ASPIRATION (FNA) LINEAR;  Surgeon: Normie Becton., MD;  Location: [facility] ENDOSCOPY;  Service: Gastroenterology;;   INGUINAL HERNIA REPAIR Right 1952   IR ANGIO INTRA EXTRACRAN SEL COM CAROTID INNOMINATE BILAT MOD SED  02/20/2019   IR BILIARY STENT(S) EXISTING ACCESS INC DILATION CATH EXCHANGE  09/20/2021   IR ENDOLUMINAL BX OF BILIARY TREE  07/22/2021   IR GENERIC HISTORICAL  10/28/2016   IR RADIOLOGIST EVAL & MGMT 10/28/2016 [facility]   IR INT EXT BILIARY DRAIN WITH CHOLANGIOGRAM  07/22/2021   JOINT REPLACEMENT     LAPAROSCOPY N/A 11/15/2021  Procedure: LAPAROSCOPIC DIAGNOSTIC STAGING;  Surgeon: Lujean Sake, MD;  Location: [facility] OR;  Service: General;  Laterality: N/A;   PORTACATH PLACEMENT N/A 08/19/2021   Procedure: INSERTION PORT-A-CATH;  Surgeon: Lujean Sake, MD;   Location: [facility] ORS;  Service: General;  Laterality: N/A;  LMA   RADIOLOGY WITH ANESTHESIA N/A 10/21/2015   Procedure: RADIOLOGY WITH ANESTHESIA;  Surgeon: Luellen Sages, MD;  Location: [facility] OR;  Service: Radiology;  Laterality: N/A;   RADIOLOGY WITH ANESTHESIA N/A 02/20/2019   Procedure: Rich Champ;  Surgeon: Luellen Sages, MD;  Location: [facility] OR;  Service: Radiology;  Laterality: N/A;   ROTATOR CUFF REPAIR Bilateral    VARICOSE VEIN SURGERY     WHIPPLE PROCEDURE N/A 11/15/2021   Procedure: WHIPPLE PROCEDURE;  Surgeon: Lujean Sake, MD;  Location: [facility] OR;  Service: General;  Laterality: N/A;    Social History:  Ambulatory      reports that he quit smoking about 62 years ago. His smoking use included cigarettes. He started smoking about 63 years ago. He has a 0.3 pack-year smoking history. He has never used smokeless tobacco. He reports that he does not drink alcohol and does not use drugs.     Family History:   Family History  Problem Relation Age of Onset   Breast cancer Mother    Lung cancer Father    Alcoholism Sister    Cirrhosis Sister    Colon cancer Neg Hx    Esophageal cancer Neg Hx    Liver cancer Neg Hx    Pancreatic cancer Neg Hx    Prostate cancer Neg Hx    ______________________________________________________________________________________________ Allergies: No Known Allergies   Prior to Admission medications   Medication Sig Start Date End Date Taking? Authorizing Provider  acetaminophen  (TYLENOL ) 500 MG tablet Take 1,000 mg by mouth as needed for headache.    [provider]  amLODipine  (NORVASC ) 5 MG tablet Take 1 tablet (5 mg total) by mouth in the morning. 12/15/23     amLODipine  (NORVASC ) 5 MG tablet Take 1 tablet (5 mg total) by mouth every morning. 12/18/23     atorvastatin  (LIPITOR) 40 MG tablet TAKE 1 TABLET (40 MG TOTAL) BY MOUTH AT BEDTIME. 02/02/23   Boscia, Rumaldo Countess, NP  escitalopram  (LEXAPRO ) 20 MG tablet Take 1 tablet (20 mg total)  by mouth daily. 09/26/22   Boscia, Heather E, NP  feeding supplement (ENSURE ENLIVE / ENSURE PLUS) LIQD Take 237 mLs by mouth 2 (two) times daily between meals. Patient taking differently: Take 237 mLs by mouth daily. When compliant 02/19/23   Rizwan, Saima, MD  levothyroxine  (SYNTHROID ) 112 MCG tablet Take 1 tablet (112 mcg total) by mouth daily before breakfast. 09/26/22   Boscia, Heather E, NP  lidocaine -prilocaine  (EMLA ) cream Apply topically to affected area(s) as needed. Patient taking differently: Apply 1 Application topically as needed (port). 08/24/22   Bettejane Brownie, PA-C  lipase/protease/amylase (CREON ) 36000 UNITS CPEP capsule Take 1 capsule (36,000 Units total) by mouth 3 (three) times daily before meals. 08/24/22   Sonja Cliffdell, MD  LORazepam  (ATIVAN ) 0.5 MG tablet Take 1 tablet (0.5 mg total) by mouth every 6 (six) hours as needed for nausea or anxiety 01/11/24     mirtazapine  (REMERON ) 7.5 MG tablet Take 1 tablet (7.5 mg total) by mouth at bedtime. 11/01/22   Sharyon Deis, NP  oxybutynin  (DITROPAN  XL) 10 MG 24 hr tablet Take 1 tablet (10 mg total) by mouth at bedtime. 03/28/23   Sonja [place],  MD  Pancrelipase , Lip-Prot-Amyl, (PANCREAZE ) 2600-8800 units CPEP Take 1 capsule by mouth as directed Take with meals 08/31/23     Pancrelipase , Lip-Prot-Amyl, (PANCREAZE ) 2600-8800 units CPEP Take 1 capsule (2,600 Units total) by mouth as directed with meals. 10/25/23     Pancrelipase , Lip-Prot-Amyl, (PANCREAZE ) 2600-8800 units CPEP Take 1 capsule (2,600 Units total) by mouth with meals as directed 01/11/24     promethazine  (PHENERGAN ) 25 MG tablet Take 1 tablet (25 mg total) by mouth every 6 (six) hours as needed for nausea/vomiting 01/11/24     traMADol  (ULTRAM ) 50 MG tablet Take 1 tablet by mouth every six hours as needed for pain 12/07/23       ___________________________________________________________________________________________________ Physical Exam:    01/18/2024    9:33 PM 01/18/2024     6:15 PM 01/18/2024    6:00 PM  Vitals with BMI  Systolic 184 166   Diastolic 120 74   Pulse 84 82 93     1. General:  in No  Acute distress   Chronically ill   -appearing 2. Psychological: Somnolent not following commands not oriented 3. Head/ENT:   Dry Mucous Membranes                          Head Non traumatic, neck supple                         A dentulous  4. SKIN:  decreased Skin turgor,  Skin clean Dry and intact no rash Multiple bruises 5. Heart: Regular rate and rhythm no  Murmur, no Rub or gallop 6. Lungs: no wheezes or crackles   7. Abdomen: Soft,  non-tender, Non distended bowel sounds present 8. Lower extremities: no clubbing, cyanosis, no  edema 9. Neurologically Grossly intact, moving all 4 extremities equally not following any commands 10. MSK: Normal range of motion    Chart has been reviewed  ______________________________________________________________________________________________  Assessment/Plan 83 y.o. male with medical history significant of pancreatic cancer status post Whipple procedure, stroke myelodysplastic syndrome, failure to thrive, hypertension  Admitted for acute encephalopathy   Present on Admission:  Acute encephalopathy  Local recurrence of pancreatic cancer (HCC)  Acquired hypothyroidism  Failure to thrive in adult  HLD (hyperlipidemia)  Middle cerebral artery aneurysm  Paroxysmal atrial fibrillation (HCC)  Prolonged QT interval  Hypophosphatemia  AKI (acute kidney injury) (HCC)     Local recurrence of pancreatic cancer (HCC) Patient currently on hospice care  Acquired hypothyroidism Check TSH  Acute encephalopathy Unclear etiology does not seem to have any evidence of infectious process at this time.  Discussed with family patient is currently on hospice and would like to avoid over aggressive intervention although would be interested in transferring to [facility] getting an MRI Patient's family would like to hold off  on LP Will order EEG appreciate neurology consult.  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  Paroxysmal atrial fibrillation (HCC) No longer on anticoagulation  Prolonged QT interval - will monitor on tele avoid QT prolonging medications, rehydrate correct electrolytes   Hypophosphatemia - will replace electrolytes and repeat  check Mg, phos and Ca level and replace as needed Monitor on telemetry  Lab Results  Component Value Date   K 3.2 (L) 01/18/2024     Lab Results  Component Value Date   CREATININE 1.80 (H) 01/18/2024   Lab Results  Component Value Date   MG 2.3 01/18/2024   Lab Results  Component Value Date   CALCIUM  9.5 01/18/2024   PHOS 2.1 (L) 01/18/2024     AKI (acute kidney injury) (HCC) Ordering electrolytes and rehydrate    Other plan as per orders.  DVT prophylaxis:  SCD     Code Status:  DNR/DNI  as per family  I had personally discussed CODE STATUS with patient and family  ACP   none   Family Communication:   Family  at  Bedside  plan of care was discussed  with   Son, Daughter,    Diet  npo    Disposition Plan:      To home vs hospice  Following barriers for discharge:                            Encephalopathy work up is complete                            Electrolytes corrected                                                         Will need consultants to evaluate patient prior to discharge                                  Consult Orders  (From admission, onward)           Start     Ordered   01/18/24 2144  Consult to hospitalist  Once       Provider:  (Not yet assigned)  Question Answer Comment  Place call to: [facility]   Reason for Consult Admit      01/18/24 2143                               Would benefit from PT/OT eval prior to DC  Ordered                   Swallow eval - SLP ordered                                     Transition of care consulted                   Nutrition    consulted                                  Palliative care    consulted                                     Consults called: Neurology is aware   Admission status:  ED  Disposition     ED Disposition  Admit   Condition  --   Comment  Hospital Area: [facility] [[id number]]  Level of Care: Progressive [102]  Admit to Progressive based on following criteria: NEUROLOGICAL AND NEUROSURGICAL complex patients with significant risk of instability, who do not meet ICU criteria, yet require close observation or frequent assessment (< / = every 2 - 4 hours) with medical / nursing intervention.  [facility] patient in [facility] at [facility] or [facility] if equivalent level of care is available:: No  Covid Evaluation: Asymptomatic - no recent exposure (last 10 days) testing not required  Diagnosis: Acute encephalopathy [[id number]]  Admitting Physician: Pao Haffey [[id number]]  Attending Physician: Able Malloy [[id number]]           Obs      Level of care   progressive        Selene Dais 01/18/2024, 11:55 PM    [facility]     after 2 AM please page floor coverage   If 7AM-7PM, please contact the day team taking care of the patient using [email or web address]

## 2024-01-18 NOTE — Assessment & Plan Note (Addendum)
 Unclear etiology does not seem to have any evidence of infectious process at this time.  Discussed with family patient is currently on hospice and would like to avoid over aggressive intervention although would be interested in transferring to [facility] getting an MRI Patient's family would like to hold off on LP Will order EEG appreciate neurology consult.  If there is any evidence of seizure activity would need to treat with Keppra rather than benzodiazepines Continue to gently rehydrate

## 2024-01-18 NOTE — Assessment & Plan Note (Signed)
 Ordering electrolytes and rehydrate

## 2024-01-18 NOTE — Assessment & Plan Note (Signed)
 No longer on anticoagulation

## 2024-01-18 NOTE — Subjective & Objective (Signed)
 Patient presented with slurred speech since started around 5 PM today patient has been having some dizziness lasting all day appears to be altered on arrival

## 2024-01-18 NOTE — Assessment & Plan Note (Signed)
 If able to tolerate could resume Lipitor

## 2024-01-18 NOTE — Assessment & Plan Note (Signed)
-   will replace electrolytes and repeat  check Mg, phos and Ca level and replace as needed Monitor on telemetry   Lab Results  Component Value Date   K 3.2 (L) 01/18/2024     Lab Results  Component Value Date   CREATININE 1.80 (H) 01/18/2024   Lab Results  Component Value Date   MG 2.3 01/18/2024   Lab Results  Component Value Date   CALCIUM  9.5 01/18/2024   PHOS 2.1 (L) 01/18/2024

## 2024-01-18 NOTE — ED Notes (Signed)
 Patient transported to CT

## 2024-01-18 NOTE — ED Provider Notes (Signed)
 [place] EMERGENCY DEPARTMENT AT [facility] Provider Note  CSN: [id number] Arrival date & time: 01/18/24 1743  Chief Complaint(s) Aphasia  HPI Camil Wilhelmsen. is a 83 y.o. 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  History and patient's son who states that they were driving to [place] to try a new restaurant today when patient had a sudden change in mental status.  He was talking normally and alert as a passenger in the car and suddenly became somnolent, confused and agitated.  Here in the emergency room, patient is encephalopathic but is moving all 4 extremities appropriately.  Additional history unable to obtain as patient is currently altered.  Of note, patient recently started on tramadol  which she started yesterday.   Past Medical History Past Medical History:  Diagnosis Date   Anxiety    Arthritis    Depression    Family history of breast cancer    Family history of lung cancer    Headache    HLD (hyperlipidemia)    Hx of inguinal hernia repair    Hypertension    Hypothyroidism    Nausea with vomiting 09/21/2021   Overactive bladder    Pancreatic cancer (HCC)    Pneumonia    Shingles    Sleep apnea    Patient denies   Thoracic ascending aortic aneurysm (HCC)    mildly dilated 4.0cm per 08/12/21 CT   Thyroid  disease    Varicose veins of both lower extremities    Patient Active Problem List   Diagnosis Date Noted   Failure to thrive in adult 02/21/2023   Right forearm cellulitis 02/21/2023   Generalized weakness 02/20/2023   Vertebral artery stenosis 02/18/2023   Pancreatic insufficiency 02/18/2023   Decreased appetite 01/06/2023   C. difficile colitis 12/20/2022   Atopic dermatitis 12/10/2022   Acute  left-sided weakness 09/16/2022   MDS (myelodysplastic syndrome) (HCC) 08/02/2022   Thrombocytopenia (HCC) 07/15/2022   Diarrhea due to malabsorption 02/03/2022   Aortic atherosclerosis (HCC) 11/10/2021   Dehydration 09/21/2021   Nausea with vomiting 09/21/2021   Superficial skin infection 09/19/2021   Lower extremity edema 09/12/2021   Protein-calorie malnutrition, severe 08/31/2021   Paroxysmal atrial fibrillation (HCC) 08/28/2021   Hypotension 08/28/2021   Hypokalemia 08/28/2021   Hyponatremia 08/28/2021   Prolonged QT interval 08/28/2021   Abnormal weight loss 08/26/2021   Situational stress 08/26/2021   Port-A-Cath in place 08/20/2021   Local recurrence of pancreatic cancer (HCC) 08/14/2021   Nonspecific abnormal results of liver function study 08/09/2021   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   Iron deficiency anemia 08/09/2021   Obstructive jaundice 07/18/2021   Shortness of breath 07/11/2021   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 Authorizing Provider  acetaminophen  (TYLENOL ) 500 MG tablet Take 1,000 mg by mouth as needed for headache.    [provider]  amLODipine  (  NORVASC ) 5 MG tablet Take 1 tablet (5 mg total) by mouth in the morning. 12/15/23     amLODipine  (NORVASC ) 5 MG tablet Take 1 tablet (5 mg total) by mouth every morning. 12/18/23     atorvastatin  (LIPITOR) 40 MG tablet TAKE 1 TABLET (40 MG TOTAL) BY MOUTH AT BEDTIME. 02/02/23   Sharyon Deis, NP  escitalopram  (LEXAPRO ) 20 MG  tablet Take 1 tablet (20 mg total) by mouth daily. 09/26/22   Boscia, Heather E, NP  feeding supplement (ENSURE ENLIVE / ENSURE PLUS) LIQD Take 237 mLs by mouth 2 (two) times daily between meals. Patient taking differently: Take 237 mLs by mouth daily. When compliant 02/19/23   Rizwan, Saima, MD  levothyroxine  (SYNTHROID ) 112 MCG tablet Take 1 tablet (112 mcg total) by mouth daily before breakfast. 09/26/22   Boscia, Heather E, NP  lidocaine -prilocaine  (EMLA ) cream Apply topically to affected area(s) as needed. Patient taking differently: Apply 1 Application topically as needed (port). 08/24/22   Bettejane Brownie, PA-C  lipase/protease/amylase (CREON ) 36000 UNITS CPEP capsule Take 1 capsule (36,000 Units total) by mouth 3 (three) times daily before meals. 08/24/22   Sonja Astoria, MD  LORazepam  (ATIVAN ) 0.5 MG tablet Take 1 tablet (0.5 mg total) by mouth every 6 (six) hours as needed for nausea or anxiety 01/11/24     mirtazapine  (REMERON ) 7.5 MG tablet Take 1 tablet (7.5 mg total) by mouth at bedtime. 11/01/22   Sharyon Deis, NP  oxybutynin  (DITROPAN  XL) 10 MG 24 hr tablet Take 1 tablet (10 mg total) by mouth at bedtime. 03/28/23   Sonja Gage, MD  Pancrelipase , Lip-Prot-Amyl, (PANCREAZE ) 2600-8800 units CPEP Take 1 capsule by mouth as directed Take with meals 08/31/23     Pancrelipase , Lip-Prot-Amyl, (PANCREAZE ) 2600-8800 units CPEP Take 1 capsule (2,600 Units total) by mouth as directed with meals. 10/25/23     Pancrelipase , Lip-Prot-Amyl, (PANCREAZE ) 2600-8800 units CPEP Take 1 capsule (2,600 Units total) by mouth with meals as directed 01/11/24     promethazine  (PHENERGAN ) 25 MG tablet Take 1 tablet (25 mg total) by mouth every 6 (six) hours as needed for nausea/vomiting 01/11/24     traMADol  (ULTRAM ) 50 MG tablet Take 1 tablet by mouth every six hours as needed for pain 12/07/23                                                                                                                                        Past Surgical History Past Surgical History:  Procedure Laterality Date   BILATERAL CARPAL TUNNEL RELEASE     CATARACT EXTRACTION, BILATERAL Bilateral    ELBOW SURGERY Bilateral    ENDOSCOPIC RETROGRADE CHOLANGIOPANCREATOGRAPHY (ERCP) WITH PROPOFOL  N/A 07/20/2021   Procedure: ENDOSCOPIC RETROGRADE CHOLANGIOPANCREATOGRAPHY (ERCP) WITH PROPOFOL ;  Surgeon: Kenney Peacemaker, MD;  Location: [facility] ENDOSCOPY;  Service: Endoscopy;  Laterality: N/A;   ESOPHAGOGASTRODUODENOSCOPY (EGD) WITH PROPOFOL  N/A 08/05/2021  Procedure: ESOPHAGOGASTRODUODENOSCOPY (EGD) WITH PROPOFOL ;  Surgeon: Brice Campi Albino Alu., MD;  Location: [facility] ENDOSCOPY;  Service: Gastroenterology;  Laterality: N/A;   EUS N/A 08/05/2021   Procedure: UPPER ENDOSCOPIC ULTRASOUND (EUS) RADIAL;  Surgeon: Normie Becton., MD;  Location: [facility] ENDOSCOPY;  Service: Gastroenterology;  Laterality: N/A;   EYE SURGERY     bilateral cataracts   FINE NEEDLE ASPIRATION  08/05/2021   Procedure: FINE NEEDLE ASPIRATION (FNA) LINEAR;  Surgeon: Normie Becton., MD;  Location: [facility] ENDOSCOPY;  Service: Gastroenterology;;   INGUINAL HERNIA REPAIR Right 1951   IR ANGIO INTRA EXTRACRAN SEL COM CAROTID INNOMINATE BILAT MOD SED  02/20/2019   IR BILIARY STENT(S) EXISTING ACCESS INC DILATION CATH EXCHANGE  09/20/2021   IR ENDOLUMINAL BX OF BILIARY TREE  07/22/2021   IR GENERIC HISTORICAL  10/28/2016   IR RADIOLOGIST EVAL & MGMT 10/28/2016 [facility]-INTERV RAD   IR INT EXT BILIARY DRAIN WITH CHOLANGIOGRAM  07/22/2021   JOINT REPLACEMENT     LAPAROSCOPY N/A 11/15/2021   Procedure: LAPAROSCOPIC DIAGNOSTIC STAGING;  Surgeon: Lujean Sake, MD;  Location: [facility] OR;  Service: General;  Laterality: N/A;   PORTACATH PLACEMENT N/A 08/19/2021   Procedure: INSERTION PORT-A-CATH;  Surgeon: Lujean Sake, MD;  Location: [facility] ORS;  Service: General;  Laterality: N/A;  LMA   RADIOLOGY WITH ANESTHESIA N/A 10/21/2015   Procedure: RADIOLOGY WITH ANESTHESIA;  Surgeon:  Luellen Sages, MD;  Location: [facility] OR;  Service: Radiology;  Laterality: N/A;   RADIOLOGY WITH ANESTHESIA N/A 02/20/2019   Procedure: Rich Champ;  Surgeon: Luellen Sages, MD;  Location: [facility] OR;  Service: Radiology;  Laterality: N/A;   ROTATOR CUFF REPAIR Bilateral    VARICOSE VEIN SURGERY     WHIPPLE PROCEDURE N/A 11/15/2021   Procedure: WHIPPLE PROCEDURE;  Surgeon: Lujean Sake, MD;  Location: [facility] OR;  Service: General;  Laterality: N/A;   Family History Family History  Problem Relation Age of Onset   Breast cancer Mother    Lung cancer Father    Alcoholism Sister    Cirrhosis Sister    Colon cancer Neg Hx    Esophageal cancer Neg Hx    Liver cancer Neg Hx    Pancreatic cancer Neg Hx    Prostate cancer Neg Hx     Social History Social History   Tobacco Use   Smoking status: Former    Current packs/day: 0.00    Average packs/day: 0.3 packs/day for 1 year (0.3 ttl pk-yrs)    Types: Cigarettes    Start date: 08/15/1960    Quit date: 08/15/1961    Years since quitting: 62.4   Smokeless tobacco: Never  Vaping Use   Vaping status: Never Used  Substance Use Topics   Alcohol use: No   Drug use: No   Allergies Patient has no known allergies.  Review of Systems Review of Systems  Unable to perform ROS: Mental status change    Physical Exam Vital Signs  I have reviewed the triage vital signs BP (!) 184/120   Pulse 84   Temp (!) 97.2 F (36.2 C) (Axillary)   Resp 15   SpO2 97%   Physical Exam Constitutional:      General: He is in acute distress.     Appearance: Normal appearance. He is ill-appearing.  HENT:     Head: Normocephalic and atraumatic.     Nose: No congestion or rhinorrhea.  Eyes:     General:        Right eye: No discharge.  Left eye: No discharge.     Extraocular Movements: Extraocular movements intact.     Pupils: Pupils are equal, round, and reactive to light.  Cardiovascular:     Rate and Rhythm: Normal rate and regular rhythm.      Heart sounds: No murmur heard. Pulmonary:     Effort: No respiratory distress.     Breath sounds: No wheezing or rales.  Abdominal:     General: There is no distension.     Tenderness: There is no abdominal tenderness.  Musculoskeletal:        General: Normal range of motion.     Cervical back: Normal range of motion.  Skin:    General: Skin is warm and dry.  Neurological:     General: No focal deficit present.     Mental Status: He is alert. He is disoriented.     ED Results and Treatments Labs (all labs ordered are listed, but only abnormal results are displayed) Labs Reviewed  COMPREHENSIVE METABOLIC PANEL WITH GFR - Abnormal; Notable for the following components:      Result Value   Potassium 3.2 (*)    CO2 17 (*)    Glucose, Bld 139 (*)    Creatinine, Ser 1.80 (*)    GFR, Estimated 37 (*)    Anion gap 18 (*)    All other components within normal limits  CBC WITH DIFFERENTIAL/PLATELET - Abnormal; Notable for the following components:   Hemoglobin 10.5 (*)    HCT 33.3 (*)    MCV 77.3 (*)    MCH 24.4 (*)    RDW 16.6 (*)    Platelets 145 (*)    Lymphs Abs 5.3 (*)    All other components within normal limits  BLOOD GAS, VENOUS - Abnormal; Notable for the following components:   pH, Ven 7.57 (*)    pCO2, Ven 22 (*)    All other components within normal limits  URINALYSIS, ROUTINE W REFLEX MICROSCOPIC - Abnormal; Notable for the following components:   Specific Gravity, Urine 1.031 (*)    Hgb urine dipstick SMALL (*)    Ketones, ur 5 (*)    Leukocytes,Ua TRACE (*)    All other components within normal limits  I-STAT CHEM 8, ED - Abnormal; Notable for the following components:   Potassium 3.2 (*)    Creatinine, Ser 1.80 (*)    Glucose, Bld 136 (*)    Calcium , Ion 1.09 (*)    TCO2 17 (*)    Hemoglobin 11.2 (*)    HCT 33.0 (*)    All other components within normal limits  CBG MONITORING, ED - Abnormal; Notable for the following components:   Glucose-Capillary  145 (*)    All other components within normal limits  AMMONIA  RAPID URINE DRUG SCREEN, HOSP PERFORMED  TSH  MAGNESIUM   PHOSPHORUS  CK  LACTIC ACID, PLASMA  LACTIC ACID, PLASMA  PROCALCITONIN  PROTIME-INR  VITAMIN B1  PREALBUMIN  Radiology CT ANGIO HEAD NECK W WO CM Result Date: 01/18/2024 CLINICAL DATA:  Stroke/TIA, determine embolic source EXAM: CT ANGIOGRAPHY HEAD AND NECK WITH AND WITHOUT CONTRAST TECHNIQUE: Multidetector CT imaging of the head and neck was performed using the standard protocol during bolus administration of intravenous contrast. Multiplanar CT image reconstructions and MIPs were obtained to evaluate the vascular anatomy. Carotid stenosis measurements (when applicable) are obtained utilizing NASCET criteria, using the distal internal carotid diameter as the denominator. RADIATION DOSE REDUCTION: This exam was performed according to the departmental dose-optimization program which includes automated exposure control, adjustment of the mA and/or kV according to patient size and/or use of iterative reconstruction technique. CONTRAST:  60mL OMNIPAQUE  IOHEXOL  350 MG/ML SOLN COMPARISON:  Same day CT head. FINDINGS: CTA NECK FINDINGS Aortic arch: Great vessel origins are not fully imaged. Aortic atherosclerosis. Right carotid system: Atherosclerosis at the carotid bifurcation and involving the proximal ICA with approximately 50% stenosis of the proximal ICA. Left carotid system: The common carotid artery origin is not imaged. No visible significant (greater than 50% stenosis). Vertebral arteries: Codominant. Patent bilaterally. Moderate left V2 vertebral artery stenosis. Skeleton: No evidence of acute abnormality on limited assessment. Multilevel degenerative change in the cervical spine. Other neck: No acute abnormality on limited assessment. Upper chest: Visualized  lung apices are clear. Review of the MIP images confirms the above findings CTA HEAD FINDINGS Anterior circulation: Bilateral intracranial ICAs, MCAs, and ACAs are patent. There is a stent within the right M1 MCA which may be narrowed along its mid to distal portion but remains patent. Posterior circulation: Bilateral intradural vertebral arteries, basilar artery and bilateral posterior cerebral arteries are patent without proximal hemodynamically significant stenosis. Venous sinuses: As permitted by contrast timing, patent. Review of the MIP images confirms the above findings IMPRESSION: 1. No emergent large vessel occlusion. 2. Patent right M1 MCA stent. Suspected narrowing along the mid to distal portions of the stent, not well quantified by CTA. Catheter arteriogram could further evaluate if clinically warranted. 3. 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 4. Moderate left V2 vertebral artery stenosis. 5. Aortic Atherosclerosis (ICD10-I70.0). Electronically Signed   By: Stevenson Elbe M.D.   On: 01/18/2024 21:05   CT CHEST ABDOMEN PELVIS WO CONTRAST Result Date: 01/18/2024 EXAM: CT CHEST, ABDOMEN AND PELVIS WITHOUT CONTRAST 01/18/2024 07:19:18 PM TECHNIQUE: CT of the chest, abdomen and pelvis was performed without the administration of intravenous contrast. Multiplanar reformatted images are provided for review. Automated exposure control, iterative reconstruction, and/or weight based adjustment of the mA/kV was utilized to reduce the radiation dose to as low as reasonably achievable. COMPARISON: MR abdomen dated 02/13/2023 and CT chest abdomen and pelvis dated 12/19/2022. CLINICAL HISTORY: Sepsis. FINDINGS: CHEST: MEDIASTINUM: Right chest port terminates in the mid SVC. Moderate 3-vessel coronary atherosclerosis. Thoracic aortic atherosclerosis. THORACIC LYMPH NODES: No mediastinal, hilar or axillary lymphadenopathy. LUNGS AND PLEURA: Faint subpleural ground-glass opacities in the lungs  bilaterally (for example, image 72), suggesting postinfectious/inflammatory scarring. No pleural effusion or pneumothorax. ABDOMEN AND PELVIS: LIVER: No intrahepatic duct dilatation. Mild pneumobilia. GALLBLADDER AND BILE DUCTS: Status post cholecystectomy with choledojejunostomy. Associated mild pneumobilia. SPLEEN: No acute abnormality. PANCREAS: Status post partial pancreatectomy with pancreaticojejunal anastomosis. No mass/abnormality of the residual pancreas. ADRENAL GLANDS: No acute abnormality. KIDNEYS, URETERS AND BLADDER: No stones in the kidneys or ureters. No hydronephrosis. No perinephric or periureteral stranding. Urinary bladder is unremarkable. GI AND BOWEL: Status post distal gastrectomy with patent gastrojejunostomy. No evidence of bowel obstruction. Normal appendix (image  90). Moderate rectal stool burden (image 103), suggesting mild fecal impaction, without stercoral colitis. Mild sigmoid diverticulosis, without evidence of diverticulitis. REPRODUCTIVE ORGANS: No acute abnormality. PERITONEUM AND RETROPERITONEUM: No ascites. No free air. VASCULATURE: Atherosclerotic calcifications of the abdominal aorta and branch vessels. ABDOMINAL AND PELVIS LYMPH NODES: No lymphadenopathy. REPRODUCTIVE ORGANS: No acute abnormality. BONES AND SOFT TISSUES: Moderate degenerative changes of the visualized thoracolumbar spine. No focal soft tissue abnormality. IMPRESSION: 1. Postsurgical changes related to whipple procedure. No evidence of recurrent or metastatic disease. 2. Suspected mild fecal impaction, without stercoral colitis. 3. Additional ancillary findings as above. Electronically signed by: Zadie Herter MD 01/18/2024 07:42 PM EDT RP Workstation: [id number]   CT Head Wo Contrast Result Date: 01/18/2024 CLINICAL DATA:  Dizziness. EXAM: CT HEAD WITHOUT CONTRAST TECHNIQUE: Contiguous axial images were obtained from the base of the skull through the vertex without intravenous contrast. RADIATION DOSE  REDUCTION: This exam was performed according to the departmental dose-optimization program which includes automated exposure control, adjustment of the mA and/or kV according to patient size and/or use of iterative reconstruction technique. COMPARISON:  February 17, 2023 FINDINGS: Brain: There is generalized cerebral atrophy with widening of the extra-axial spaces and ventricular dilatation. There are areas of decreased attenuation within the white matter tracts of the supratentorial brain, consistent with microvascular disease changes. A small chronic right occipital lobe infarct is noted. Vascular: Marked severity bilateral cavernous carotid artery calcification is noted. A right MCA stent is in place. Skull: Normal. Negative for fracture or focal lesion. Sinuses/Orbits: No acute finding. Other: None. IMPRESSION: 1. Generalized cerebral atrophy and microvascular disease changes of the supratentorial brain. 2. Small chronic right occipital lobe infarct. 3. No acute intracranial abnormality. 4. Right MCA stent. Electronically Signed   By: Virgle Grime M.D.   On: 01/18/2024 18:45    Pertinent labs & imaging results that were available during my care of the patient were reviewed by me and considered in my medical decision making (see MDM for details).  Medications Ordered in ED Medications  LORazepam  (ATIVAN ) injection 1 mg (1 mg Intravenous Given 01/18/24 1810)  iohexol  (OMNIPAQUE ) 350 MG/ML injection 60 mL (60 mLs Intravenous Contrast Given 01/18/24 1918)  LORazepam  (ATIVAN ) injection 2 mg (2 mg Intravenous Given 01/18/24 1900)  hydrALAZINE  (APRESOLINE ) injection 5 mg (5 mg Intravenous Given 01/18/24 2156)                                                                                                                                     Procedures .Critical Care  Performed by: Karlyn Overman, MD Authorized by: Karlyn Overman, MD   Critical care provider statement:    Critical care time (minutes):  30    Critical care was necessary to treat or prevent imminent or life-threatening deterioration of the following conditions:  CNS failure or compromise   Critical care was time spent personally by me on the following activities:  Development  of treatment plan with patient or surrogate, discussions with consultants, evaluation of patient's response to treatment, examination of patient, ordering and review of laboratory studies, ordering and review of radiographic studies, ordering and performing treatments and interventions, pulse oximetry, re-evaluation of patient's condition and review of old charts   (including critical care time)  Medical Decision Making / ED Course   This patient presents to the ED for concern of altered mental status, this involves an extensive number of treatment options, and is a complaint that carries with it a high risk of complications and morbidity.  The differential diagnosis includes infection, metabolic/toxic encephalopathy, hypoglycemia, malperfusion, hypoxia, trauma or other intracranial process  MDM: Patient seen emergency room for evaluation of altered mental status.  Physical exam reveals an encephalopathic patient moving all 4 extremities that is very agitated and confused.  Patient required physical strength with mittens and multiple doses of benzodiazepines for chemical restraint to facilitate medical care.  Ultimately patient did receive appropriate anxiolysis and laboratory evaluation with a hemoglobin of 10.5, platelet count 145 consistent with his MDS 1.2, creatinine 1.8, urinalysis without evidence of infection, UDS negative, pH with 7.57.  CT head without evidence of bleed, CT angio with patent stent and no LVO, chest abdomen pelvis with increased stool burden but is otherwise unremarkable.  On reevaluation, patient remains altered and will require hospital admission for further workup of his altered mental status.  I did speak with the neurologist on-call Dr.  Murvin Arthurs who is recommending [facility] admission for EEG.  Patient admitted to the hospitalist   Additional history obtained: -Additional history obtained from multiple family members -External records from outside source obtained and reviewed including: Chart review including previous notes, labs, imaging, consultation notes   Lab Tests: -I ordered, reviewed, and interpreted labs.   The pertinent results include:   Labs Reviewed  COMPREHENSIVE METABOLIC PANEL WITH GFR - Abnormal; Notable for the following components:      Result Value   Potassium 3.2 (*)    CO2 17 (*)    Glucose, Bld 139 (*)    Creatinine, Ser 1.80 (*)    GFR, Estimated 37 (*)    Anion gap 18 (*)    All other components within normal limits  CBC WITH DIFFERENTIAL/PLATELET - Abnormal; Notable for the following components:   Hemoglobin 10.5 (*)    HCT 33.3 (*)    MCV 77.3 (*)    MCH 24.4 (*)    RDW 16.6 (*)    Platelets 145 (*)    Lymphs Abs 5.3 (*)    All other components within normal limits  BLOOD GAS, VENOUS - Abnormal; Notable for the following components:   pH, Ven 7.57 (*)    pCO2, Ven 22 (*)    All other components within normal limits  URINALYSIS, ROUTINE W REFLEX MICROSCOPIC - Abnormal; Notable for the following components:   Specific Gravity, Urine 1.031 (*)    Hgb urine dipstick SMALL (*)    Ketones, ur 5 (*)    Leukocytes,Ua TRACE (*)    All other components within normal limits  I-STAT CHEM 8, ED - Abnormal; Notable for the following components:   Potassium 3.2 (*)    Creatinine, Ser 1.80 (*)    Glucose, Bld 136 (*)    Calcium , Ion 1.09 (*)    TCO2 17 (*)    Hemoglobin 11.2 (*)    HCT 33.0 (*)    All other components within normal limits  CBG MONITORING, ED -  Abnormal; Notable for the following components:   Glucose-Capillary 145 (*)    All other components within normal limits  AMMONIA  RAPID URINE DRUG SCREEN, HOSP PERFORMED  TSH  MAGNESIUM   PHOSPHORUS  CK  LACTIC ACID,  PLASMA  LACTIC ACID, PLASMA  PROCALCITONIN  PROTIME-INR  VITAMIN B1  PREALBUMIN      EKG   EKG Interpretation Date/Time:  Thursday January 18 2024 17:50:51 EDT Ventricular Rate:  102 PR Interval:  198 QRS Duration:  86 QT Interval:  432 QTC Calculation: 563 R Axis:   -29  Text Interpretation: Sinus tachycardia Probable left atrial enlargement Prolonged QT interval Confirmed by Ediel Unangst ([id number]) on 01/19/2024 1:08:08 AM         Imaging Studies ordered: I ordered imaging studies including CT head, CT angio head and neck, CT chest abdomen pelvis I independently visualized and interpreted imaging. I agree with the radiologist interpretation   Medicines ordered and prescription drug management: Meds ordered this encounter  Medications   LORazepam  (ATIVAN ) injection 1 mg   iohexol  (OMNIPAQUE ) 350 MG/ML injection 60 mL   LORazepam  (ATIVAN ) injection 2 mg   hydrALAZINE  (APRESOLINE ) injection 5 mg    -I have reviewed the patients home medicines and have made adjustments as needed  Critical interventions Physical and chemical restraint, expedited CT imaging, neurology consultation  Consultations Obtained: I requested consultation with the neurologist on-call ,  and discussed lab and imaging findings as well as pertinent plan - they recommend: [facility] for EEG   Cardiac Monitoring: The patient was maintained on a cardiac monitor.  I personally viewed and interpreted the cardiac monitored which showed an underlying rhythm of: Sinus tachycardia  Social Determinants of Health:  Factors impacting patients care include: none   Reevaluation: After the interventions noted above, I reevaluated the patient and found that they have :stayed the same  Co morbidities that complicate the patient evaluation  Past Medical History:  Diagnosis Date   Anxiety    Arthritis    Depression    Family history of breast cancer    Family history of lung cancer    Headache     HLD (hyperlipidemia)    Hx of inguinal hernia repair    Hypertension    Hypothyroidism    Nausea with vomiting 09/21/2021   Overactive bladder    Pancreatic cancer (HCC)    Pneumonia    Shingles    Sleep apnea    Patient denies   Thoracic ascending aortic aneurysm (HCC)    mildly dilated 4.0cm per 08/12/21 CT   Thyroid  disease    Varicose veins of both lower extremities       Dispostion: I considered admission for this patient, and patient require hospital mission for persistent altered mental status     Final Clinical Impression(s) / ED Diagnoses Final diagnoses:  Altered mental status, unspecified altered mental status type     @PCDICTATION @    Faatimah Spielberg, Alyse July, MD 01/19/24 [phone]

## 2024-01-18 NOTE — Assessment & Plan Note (Signed)
 Once improved would benefit from nutritional consult

## 2024-01-18 NOTE — Assessment & Plan Note (Signed)
Patient currently on hospice care.

## 2024-01-18 NOTE — Assessment & Plan Note (Signed)
-  will monitor on tele avoid QT prolonging medications, rehydrate correct electrolytes ? ?

## 2024-01-19 ENCOUNTER — Observation Stay (HOSPITAL_COMMUNITY)

## 2024-01-19 ENCOUNTER — Observation Stay (HOSPITAL_COMMUNITY)
Admit: 2024-01-19 | Discharge: 2024-01-19 | Disposition: A | Discharge status: Home / Self Care | Attending: Internal Medicine | Admitting: Internal Medicine

## 2024-01-19 DIAGNOSIS — Z801 Family history of malignant neoplasm of trachea, bronchus and lung: Secondary | ICD-10-CM | POA: Diagnosis not present

## 2024-01-19 DIAGNOSIS — G459 Transient cerebral ischemic attack, unspecified: Secondary | ICD-10-CM | POA: Diagnosis not present

## 2024-01-19 DIAGNOSIS — E876 Hypokalemia: Secondary | ICD-10-CM | POA: Diagnosis present

## 2024-01-19 DIAGNOSIS — Z6372 Alcoholism and drug addiction in family: Secondary | ICD-10-CM | POA: Diagnosis not present

## 2024-01-19 DIAGNOSIS — Z66 Do not resuscitate: Secondary | ICD-10-CM | POA: Diagnosis present

## 2024-01-19 DIAGNOSIS — R627 Adult failure to thrive: Secondary | ICD-10-CM | POA: Diagnosis present

## 2024-01-19 DIAGNOSIS — R569 Unspecified convulsions: Secondary | ICD-10-CM | POA: Diagnosis not present

## 2024-01-19 DIAGNOSIS — N184 Chronic kidney disease, stage 4 (severe): Secondary | ICD-10-CM | POA: Diagnosis present

## 2024-01-19 DIAGNOSIS — G9341 Metabolic encephalopathy: Secondary | ICD-10-CM | POA: Diagnosis present

## 2024-01-19 DIAGNOSIS — Z7189 Other specified counseling: Secondary | ICD-10-CM | POA: Diagnosis not present

## 2024-01-19 DIAGNOSIS — G4730 Sleep apnea, unspecified: Secondary | ICD-10-CM | POA: Diagnosis present

## 2024-01-19 DIAGNOSIS — R4781 Slurred speech: Secondary | ICD-10-CM | POA: Diagnosis not present

## 2024-01-19 DIAGNOSIS — D469 Myelodysplastic syndrome, unspecified: Secondary | ICD-10-CM | POA: Diagnosis present

## 2024-01-19 DIAGNOSIS — E039 Hypothyroidism, unspecified: Secondary | ICD-10-CM | POA: Diagnosis present

## 2024-01-19 DIAGNOSIS — C259 Malignant neoplasm of pancreas, unspecified: Secondary | ICD-10-CM | POA: Diagnosis present

## 2024-01-19 DIAGNOSIS — G9340 Encephalopathy, unspecified: Secondary | ICD-10-CM | POA: Diagnosis present

## 2024-01-19 DIAGNOSIS — R42 Dizziness and giddiness: Secondary | ICD-10-CM | POA: Diagnosis not present

## 2024-01-19 DIAGNOSIS — R54 Age-related physical debility: Secondary | ICD-10-CM | POA: Diagnosis present

## 2024-01-19 DIAGNOSIS — Z79899 Other long term (current) drug therapy: Secondary | ICD-10-CM | POA: Diagnosis not present

## 2024-01-19 DIAGNOSIS — I7121 Aneurysm of the ascending aorta, without rupture: Secondary | ICD-10-CM | POA: Diagnosis present

## 2024-01-19 DIAGNOSIS — G928 Other toxic encephalopathy: Secondary | ICD-10-CM | POA: Diagnosis present

## 2024-01-19 DIAGNOSIS — T40425A Adverse effect of tramadol, initial encounter: Secondary | ICD-10-CM | POA: Diagnosis present

## 2024-01-19 DIAGNOSIS — I5032 Chronic diastolic (congestive) heart failure: Secondary | ICD-10-CM | POA: Diagnosis present

## 2024-01-19 DIAGNOSIS — R4182 Altered mental status, unspecified: Secondary | ICD-10-CM | POA: Diagnosis present

## 2024-01-19 DIAGNOSIS — I130 Hypertensive heart and chronic kidney disease with heart failure and stage 1 through stage 4 chronic kidney disease, or unspecified chronic kidney disease: Secondary | ICD-10-CM | POA: Diagnosis present

## 2024-01-19 DIAGNOSIS — Z7401 Bed confinement status: Secondary | ICD-10-CM | POA: Diagnosis not present

## 2024-01-19 DIAGNOSIS — E785 Hyperlipidemia, unspecified: Secondary | ICD-10-CM | POA: Diagnosis present

## 2024-01-19 DIAGNOSIS — Z811 Family history of alcohol abuse and dependence: Secondary | ICD-10-CM | POA: Diagnosis not present

## 2024-01-19 DIAGNOSIS — I480 Paroxysmal atrial fibrillation: Secondary | ICD-10-CM | POA: Diagnosis present

## 2024-01-19 DIAGNOSIS — Z79890 Hormone replacement therapy (postmenopausal): Secondary | ICD-10-CM | POA: Diagnosis not present

## 2024-01-19 DIAGNOSIS — I10 Essential (primary) hypertension: Secondary | ICD-10-CM | POA: Diagnosis not present

## 2024-01-19 DIAGNOSIS — I6782 Cerebral ischemia: Secondary | ICD-10-CM | POA: Diagnosis not present

## 2024-01-19 DIAGNOSIS — N179 Acute kidney failure, unspecified: Secondary | ICD-10-CM | POA: Diagnosis present

## 2024-01-19 DIAGNOSIS — H9190 Unspecified hearing loss, unspecified ear: Secondary | ICD-10-CM | POA: Diagnosis present

## 2024-01-19 LAB — COMPREHENSIVE METABOLIC PANEL WITH GFR
ALT: 13 U/L (ref 0–44)
AST: 28 U/L (ref 15–41)
Albumin: 3.8 g/dL (ref 3.5–5.0)
Alkaline Phosphatase: 104 U/L (ref 38–126)
Anion gap: 10 (ref 5–15)
BUN: 13 mg/dL (ref 8–23)
CO2: 23 mmol/L (ref 22–32)
Calcium: 8.9 mg/dL (ref 8.9–10.3)
Chloride: 99 mmol/L (ref 98–111)
Creatinine, Ser: 1.02 mg/dL (ref 0.61–1.24)
GFR, Estimated: >60 mL/min (ref >60)
Glucose, Bld: 103 mg/dL — ABNORMAL HIGH (ref 70–99)
Potassium: 3.6 mmol/L (ref 3.5–5.1)
Sodium: 132 mmol/L — ABNORMAL LOW (ref 135–145)
Total Bilirubin: 0.8 mg/dL (ref 0.0–1.2)
Total Protein: 7.5 g/dL (ref 6.5–8.1)

## 2024-01-19 LAB — CBC
HCT: 34.4 % — ABNORMAL LOW (ref 39.0–52.0)
Hemoglobin: 11.1 g/dL — ABNORMAL LOW (ref 13.0–17.0)
MCH: 24.7 pg — ABNORMAL LOW (ref 26.0–34.0)
MCHC: 32.3 g/dL (ref 30.0–36.0)
MCV: 76.4 fL — ABNORMAL LOW (ref 80.0–100.0)
Platelets: 123 10*3/uL — ABNORMAL LOW (ref 150–400)
RBC: 4.5 MIL/uL (ref 4.22–5.81)
RDW: 17 % — ABNORMAL HIGH (ref 11.5–15.5)
WBC: 5.7 10*3/uL (ref 4.0–10.5)
nRBC: 0 % (ref 0.0–0.2)

## 2024-01-19 LAB — PHOSPHORUS: Phosphorus: 3.4 mg/dL (ref 2.5–4.6)

## 2024-01-19 LAB — MAGNESIUM: Magnesium: 2.1 mg/dL (ref 1.7–2.4)

## 2024-01-19 LAB — PREALBUMIN: Prealbumin: 13 mg/dL — ABNORMAL LOW (ref 18–38)

## 2024-01-19 LAB — CK: Total CK: 137 U/L (ref 49–397)

## 2024-01-19 LAB — LACTIC ACID, PLASMA: Lactic Acid, Venous: 0.9 mmol/L (ref 0.5–1.9)

## 2024-01-19 MED ORDER — HYDRALAZINE HCL 20 MG/ML IJ SOLN
10.0000 mg | Freq: Four times a day (QID) | INTRAMUSCULAR | Status: DC | PRN
  Administered 2024-01-19: 10 mg via INTRAVENOUS
  Filled 2024-01-19: qty 1

## 2024-01-19 MED ORDER — ACETAMINOPHEN 325 MG PO TABS: 650.0000 mg | ORAL_TABLET | Freq: Four times a day (QID) | ORAL | Status: DC | PRN

## 2024-01-19 MED ORDER — HYDROCODONE-ACETAMINOPHEN 5-325 MG PO TABS: 1.0000 | ORAL_TABLET | ORAL | Status: DC | PRN

## 2024-01-19 MED ORDER — LEVOTHYROXINE SODIUM 112 MCG PO TABS
112.0000 ug | ORAL_TABLET | Freq: Every day | ORAL | Status: DC
  Administered 2024-01-20: 112 ug via ORAL
  Filled 2024-01-19: qty 1

## 2024-01-19 MED ORDER — ACETAMINOPHEN 650 MG RE SUPP: 650.0000 mg | Freq: Four times a day (QID) | RECTAL | Status: DC | PRN

## 2024-01-19 MED ORDER — ALBUTEROL SULFATE (2.5 MG/3ML) 0.083% IN NEBU: 2.5000 mg | INHALATION_SOLUTION | RESPIRATORY_TRACT | Status: DC | PRN

## 2024-01-19 MED ORDER — SODIUM CHLORIDE 0.9 % IV SOLN: INTRAVENOUS | Status: AC

## 2024-01-19 NOTE — Procedures (Signed)
 Patient Name: Angel MCENTEE Sr.  MRN: [id number]  Epilepsy Attending: Arleene Lack  Referring Physician/Provider: Doutova, Anastassia, MD  Date: 01/19/2024 Duration: 24.02 mins  Patient history: 83yo M with ams. EEG to evaluate for seizure.  Level of alertness: Awake, asleep  AEDs during EEG study: None  Technical aspects: This EEG study was done with scalp electrodes positioned according to the 10-20 International system of electrode placement. Electrical activity was reviewed with band pass filter of 1-70Hz , sensitivity of 7 uV/mm, display speed of 33mm/sec with a 60Hz  notched filter applied as appropriate. EEG data were recorded continuously and digitally stored.  Video monitoring was available and reviewed as appropriate.  Description: The posterior dominant rhythm consists of 8 Hz activity of moderate voltage (25-35 uV) seen predominantly in posterior head regions, symmetric and reactive to eye opening and eye closing.  Sleep was characterized by vertex waves, sleep spindles (12 to 14 Hz), maximal frontocentral region. EEG showed intermittent generalized 3 to 6 Hz theta-delta slowing. Hyperventilation and photic stimulation were not performed.     ABNORMALITY - Intermittent slow, generalized  IMPRESSION: This study is suggestive of mild diffuse encephalopathy. No seizures or epileptiform discharges were seen throughout the recording.  Elodia Haviland O Ashlinn Hemrick

## 2024-01-19 NOTE — Progress Notes (Signed)
EEG complete. Results pending.  ?

## 2024-01-19 NOTE — Progress Notes (Signed)
 PROGRESS NOTE    Angel Keller  MWU:[id number] DOB: 06/25/1941 DOA: 01/18/2024 PCP: Sharyon Deis, NP    Brief Narrative:  83 year old man with history of pancreatic cancer status post Whipple procedure, failure to thrive, hypertension.  He lives at home with daughter.  Patient was recently complaining of pain on his jaw.  He is followed by home hospice.  Patient was taken to lunch by his son yesterday afternoon, he started slurring and holding his head and insist on going home.  He was almost lethargic to the point unable to keep awake so son drove him straight to the ER. In the emergency room significantly confused agitated.  Stroke workup negative.  Admitted with altered mental status.  Had taken 1 dose of tramadol  about 24 hours before the symptom onset.  Subjective: Patient seen and examined.  Detailed history taken from son and daughter at the bedside.  Patient had been agitated overnight and has mittens on. Hard of hearing.  On strong stimulation, he was able to tell that he is fine.  His voice is still very muffled.  Unable to keep awake.   Assessment & Plan:   Acute metabolic encephalopathy: Primary cause unsure. Patient persistently encephalopathic. No evidence of acute infection.  Procalcitonin less than 0.1. CT head, CT angiogram of the head and neck and subsequent MRI without evidence of a stroke or intracranial pathology. Suspect tramadol  induced altered mental status, questionable seizure disorder.  Currently no evidence of ongoing seizure. Provide supportive care.  N.p.o. until awake.  Symptomatic management of delirium and agitation as well as prevent from fall.  Use soft restraints as needed. EEG, pending. GI will be following. At this time the goal is to continue supportive care, IV fluid.  Family agreeable to transfer patient to [facility] for ongoing neurology workup. TSH is normal.  Hypokalemia: Replace.  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   Monitor.  Chronic medical issues including Hypothyroidism, on Synthroid  Failure to thrive due to pancreatic cancer, currently on hospice at home but not enrolled into end-of-life care. Hyperlipidemia, on Lipitor  Goal of care: Hospice at home.  Not for end-of-life care.  Remains quite debilitated and now with altered mental status. Goal is to continue supportive care. Discussed with the patient and family, if no adequate improvement may benefit with providing end-of-life care.  Family hopes to take him home with some stabilization. Consult palliative care team to coordinate care and continue follow-up.   DVT prophylaxis: SCDs   Code Status: DNR with limited intervention Family Communication: Son and daughter at the bedside, grandson at the bedside Disposition Plan: Status is: Observation The patient will require care spanning > 2 midnights and should be moved to inpatient because: Significant altered mental status, neurological workup     Consultants:  Neurology  Procedures:  EEG  Antimicrobials:  None     Objective: Vitals:   01/19/24 0600 01/19/24 0605 01/19/24 0630 01/19/24 0730  BP: (!) 153/75  (!) 163/84 (!) 174/82  Pulse:    69  Resp:    20  Temp:  97.6 F (36.4 C)  98.4 F (36.9 C)  TempSrc:    Oral  SpO2:    100%   No intake or output data in the 24 hours ending 01/19/24 0811 There were no vitals filed for this visit.  Examination:  General exam: Appears calm and comfortable.  Very frail and debilitated. Respiratory system: Clear to auscultation. Respiratory effort normal.  No added sounds. Cardiovascular system:  S1 & S2 heard, RRR.  Gastrointestinal system: Soft.  Nontender.  Bowel sounds present. Central nervous system: Sleepy and lethargic.  Opens mouth and follows simple commands on verbal stimuli.  Withdraws all extremities.  Mostly keeps eyes closed and sleepy. Patient has some bruises on the anterior sternal area.   Data Reviewed: I have  personally reviewed following labs and imaging studies  CBC: Recent Labs  Lab 01/18/24 1757 01/18/24 1803  WBC 10.3  --   NEUTROABS 3.9  --   HGB 10.5* 11.2*  HCT 33.3* 33.0*  MCV 77.3*  --   PLT 145*  --    Basic Metabolic Panel: Recent Labs  Lab 01/18/24 1757 01/18/24 1803  NA 137 137  K 3.2* 3.2*  CL 102 105  CO2 17*  --   GLUCOSE 139* 136*  BUN 20 17  CREATININE 1.80* 1.80*  CALCIUM  9.5  --   MG 2.3  --   PHOS 2.1*  --    GFR: CrCl cannot be calculated (Unknown ideal weight.). Liver Function Tests: Recent Labs  Lab 01/18/24 1757  AST 33  ALT 14  ALKPHOS 109  BILITOT 0.8  PROT 7.9  ALBUMIN  4.1   No results for input(s): "LIPASE", "AMYLASE" in the last 168 hours. Recent Labs  Lab 01/18/24 1800  AMMONIA 14   Coagulation Profile: Recent Labs  Lab 01/18/24 1757  INR 1.3*   Cardiac Enzymes: Recent Labs  Lab 01/19/24 0444  CKTOTAL 137   BNP (last 3 results) No results for input(s): "PROBNP" in the last 8760 hours. HbA1C: No results for input(s): "HGBA1C" in the last 72 hours. CBG: Recent Labs  Lab 01/18/24 1756  GLUCAP 145*   Lipid Profile: No results for input(s): "CHOL", "HDL", "LDLCALC", "TRIG", "CHOLHDL", "LDLDIRECT" in the last 72 hours. Thyroid  Function Tests: Recent Labs    01/18/24 2158  TSH 0.444   Anemia Panel: No results for input(s): "VITAMINB12", "FOLATE", "FERRITIN", "TIBC", "IRON", "RETICCTPCT" in the last 72 hours. Sepsis Labs: Recent Labs  Lab 01/18/24 1757 01/18/24 2221 01/19/24 0444  PROCALCITON <0.10  --   --   LATICACIDVEN  --  1.0 0.9    No results found for this or any previous visit (from the past 240 hours).       Radiology Studies: CT ANGIO HEAD NECK W WO CM Result Date: 01/18/2024 CLINICAL DATA:  Stroke/TIA, determine embolic source EXAM: CT ANGIOGRAPHY HEAD AND NECK WITH AND WITHOUT CONTRAST TECHNIQUE: Multidetector CT imaging of the head and neck was performed using the standard protocol during  bolus administration of intravenous contrast. Multiplanar CT image reconstructions and MIPs were obtained to evaluate the vascular anatomy. Carotid stenosis measurements (when applicable) are obtained utilizing NASCET criteria, using the distal internal carotid diameter as the denominator. RADIATION DOSE REDUCTION: This exam was performed according to the departmental dose-optimization program which includes automated exposure control, adjustment of the mA and/or kV according to patient size and/or use of iterative reconstruction technique. CONTRAST:  60mL OMNIPAQUE  IOHEXOL  350 MG/ML SOLN COMPARISON:  Same day CT head. FINDINGS: CTA NECK FINDINGS Aortic arch: Great vessel origins are not fully imaged. Aortic atherosclerosis. Right carotid system: Atherosclerosis at the carotid bifurcation and involving the proximal ICA with approximately 50% stenosis of the proximal ICA. Left carotid system: The common carotid artery origin is not imaged. No visible significant (greater than 50% stenosis). Vertebral arteries: Codominant. Patent bilaterally. Moderate left V2 vertebral artery stenosis. Skeleton: No evidence of acute abnormality on limited assessment. Multilevel degenerative change  in the cervical spine. Other neck: No acute abnormality on limited assessment. Upper chest: Visualized lung apices are clear. Review of the MIP images confirms the above findings CTA HEAD FINDINGS Anterior circulation: Bilateral intracranial ICAs, MCAs, and ACAs are patent. There is a stent within the right M1 MCA which may be narrowed along its mid to distal portion but remains patent. Posterior circulation: Bilateral intradural vertebral arteries, basilar artery and bilateral posterior cerebral arteries are patent without proximal hemodynamically significant stenosis. Venous sinuses: As permitted by contrast timing, patent. Review of the MIP images confirms the above findings IMPRESSION: 1. No emergent large vessel occlusion. 2. Patent  right M1 MCA stent. Suspected narrowing along the mid to distal portions of the stent, not well quantified by CTA. Catheter arteriogram could further evaluate if clinically warranted. 3. 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 4. Moderate left V2 vertebral artery stenosis. 5. Aortic Atherosclerosis (ICD10-I70.0). Electronically Signed   By: Stevenson Elbe M.D.   On: 01/18/2024 21:05   CT CHEST ABDOMEN PELVIS WO CONTRAST Result Date: 01/18/2024 EXAM: CT CHEST, ABDOMEN AND PELVIS WITHOUT CONTRAST 01/18/2024 07:19:18 PM TECHNIQUE: CT of the chest, abdomen and pelvis was performed without the administration of intravenous contrast. Multiplanar reformatted images are provided for review. Automated exposure control, iterative reconstruction, and/or weight based adjustment of the mA/kV was utilized to reduce the radiation dose to as low as reasonably achievable. COMPARISON: MR abdomen dated 02/13/2023 and CT chest abdomen and pelvis dated 12/19/2022. CLINICAL HISTORY: Sepsis. FINDINGS: CHEST: MEDIASTINUM: Right chest port terminates in the mid SVC. Moderate 3-vessel coronary atherosclerosis. Thoracic aortic atherosclerosis. THORACIC LYMPH NODES: No mediastinal, hilar or axillary lymphadenopathy. LUNGS AND PLEURA: Faint subpleural ground-glass opacities in the lungs bilaterally (for example, image 72), suggesting postinfectious/inflammatory scarring. No pleural effusion or pneumothorax. ABDOMEN AND PELVIS: LIVER: No intrahepatic duct dilatation. Mild pneumobilia. GALLBLADDER AND BILE DUCTS: Status post cholecystectomy with choledojejunostomy. Associated mild pneumobilia. SPLEEN: No acute abnormality. PANCREAS: Status post partial pancreatectomy with pancreaticojejunal anastomosis. No mass/abnormality of the residual pancreas. ADRENAL GLANDS: No acute abnormality. KIDNEYS, URETERS AND BLADDER: No stones in the kidneys or ureters. No hydronephrosis. No perinephric or periureteral stranding. Urinary bladder  is unremarkable. GI AND BOWEL: Status post distal gastrectomy with patent gastrojejunostomy. No evidence of bowel obstruction. Normal appendix (image 90). Moderate rectal stool burden (image 103), suggesting mild fecal impaction, without stercoral colitis. Mild sigmoid diverticulosis, without evidence of diverticulitis. REPRODUCTIVE ORGANS: No acute abnormality. PERITONEUM AND RETROPERITONEUM: No ascites. No free air. VASCULATURE: Atherosclerotic calcifications of the abdominal aorta and branch vessels. ABDOMINAL AND PELVIS LYMPH NODES: No lymphadenopathy. REPRODUCTIVE ORGANS: No acute abnormality. BONES AND SOFT TISSUES: Moderate degenerative changes of the visualized thoracolumbar spine. No focal soft tissue abnormality. IMPRESSION: 1. Postsurgical changes related to whipple procedure. No evidence of recurrent or metastatic disease. 2. Suspected mild fecal impaction, without stercoral colitis. 3. Additional ancillary findings as above. Electronically signed by: Zadie Herter MD 01/18/2024 07:42 PM EDT RP Workstation: [id number]   CT Head Wo Contrast Result Date: 01/18/2024 CLINICAL DATA:  Dizziness. EXAM: CT HEAD WITHOUT CONTRAST TECHNIQUE: Contiguous axial images were obtained from the base of the skull through the vertex without intravenous contrast. RADIATION DOSE REDUCTION: This exam was performed according to the departmental dose-optimization program which includes automated exposure control, adjustment of the mA and/or kV according to patient size and/or use of iterative reconstruction technique. COMPARISON:  February 17, 2023 FINDINGS: Brain: There is generalized cerebral atrophy with widening of the extra-axial spaces and ventricular dilatation.  There are areas of decreased attenuation within the white matter tracts of the supratentorial brain, consistent with microvascular disease changes. A small chronic right occipital lobe infarct is noted. Vascular: Marked severity bilateral cavernous carotid artery  calcification is noted. A right MCA stent is in place. Skull: Normal. Negative for fracture or focal lesion. Sinuses/Orbits: No acute finding. Other: None. IMPRESSION: 1. Generalized cerebral atrophy and microvascular disease changes of the supratentorial brain. 2. Small chronic right occipital lobe infarct. 3. No acute intracranial abnormality. 4. Right MCA stent. Electronically Signed   By: Virgle Grime M.D.   On: 01/18/2024 18:45        Scheduled Meds:  thiamine (VITAMIN B1) injection  100 mg Intravenous Daily   Continuous Infusions:   LOS: 0 days    Time spent: 55 minutes    Vada Garibaldi, MD [facility]

## 2024-01-19 NOTE — Progress Notes (Signed)
 [facility] ED rm 9 [facility]  hospitalized hospice patient visit  Mr. Kucinski is a current [facility] patient with a terminal diagnosis of malignant neoplasm of pancreas. He was out with his son yesterday when he began experiencing stroke like symptoms and decision was made to bring him to the ED for evaluation. He was admitted 6.5.25 with diagnosis of acute encephalopathy. Per Dr. Tessie Fila with [facility] this is a related hospital admission.   Visited in ED room with patient and his children. Mr. Budney is not alert or verbally responsive but does occasionally open his eyes and look around. We discussed the events of the last 24 hours and family reports that they are still waiting on some confirmation from the doctor of what might be going on.   Patient is inpatient appropriate due to need for further testing and diagnostics  Vital Signs- 98.9/66/18     184/97    spO2 99% on room air Intake/Output- not yet recorded Abnormal labs- Na+ 132, Hgb 11.1, Hct 34.4, Platelets 123 Diagnostics- CT Heat and MRI both without acute abnormalities      IV/PRN Meds- Ativan  2mg  IV once, Ativan  1mg  IV once, Thiamine 100mg  IV daily, NS @100ml /H, Potassium Phosphate 15mmol IV once  Problem List as per PN Dr. Letty Raya Ghimire 6.6.25  Acute metabolic encephalopathy: Primary cause unsure. Patient persistently encephalopathic. No evidence of acute infection.  Procalcitonin less than 0.1. CT head, CT angiogram of the head and neck and subsequent MRI without evidence of a stroke or intracranial pathology. Suspect tramadol  induced altered mental status, questionable seizure disorder.  Currently no evidence of ongoing seizure. Provide supportive care.  N.p.o. until awake.  Symptomatic management of delirium and agitation as well as prevent from fall.  Use soft restraints as needed. EEG, pending. GI will be following. At this time the goal is to continue supportive care, IV fluid.  Family agreeable to  transfer patient to [facility] for ongoing neurology workup. TSH is normal.   Hypokalemia: Replace.   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  Monitor.   Chronic medical issues including Hypothyroidism, on Synthroid  Failure to thrive due to pancreatic cancer, currently on hospice at home but not enrolled into end-of-life care. Hyperlipidemia, on Lipitor  Discharge Planning- Ongoing, family desires testing for current issues Family Contact- talked with family at bedside IDT- Updated Goals of care - DNR Dwane Gitelman, RN, [facility] Liaison [phone]

## 2024-01-19 NOTE — Evaluation (Signed)
 SLP Cancellation Note  Patient Details Name: Angel BECKER Sr. MRN: [id number] DOB: April 03, 1941   Cancelled treatment:       Reason Eval/Treat Not Completed: Other (comment) (pt lethargic at this time per RN, will continue efforts; note pt is followed by hospice at home)  Maudie Sorrow, MS [facility] SLP Acute Rehab Services Office [phone]   Chantal Comment 01/19/2024, 3:16 PM

## 2024-01-20 DIAGNOSIS — Z7189 Other specified counseling: Secondary | ICD-10-CM

## 2024-01-20 DIAGNOSIS — R627 Adult failure to thrive: Secondary | ICD-10-CM | POA: Diagnosis not present

## 2024-01-20 DIAGNOSIS — G9341 Metabolic encephalopathy: Secondary | ICD-10-CM | POA: Diagnosis not present

## 2024-01-20 MED ORDER — PANTOPRAZOLE SODIUM 40 MG PO TBEC
40.0000 mg | DELAYED_RELEASE_TABLET | Freq: Every day | ORAL | Status: DC
  Administered 2024-01-20: 40 mg via ORAL
  Filled 2024-01-20: qty 1

## 2024-01-20 NOTE — Consult Note (Signed)
 Consultation Note Date: 01/20/2024   Patient Name: Angel Keller  DOB: 08-31-1940  MRN: [id number]  Age / Sex: 83 y.o., male  PCP: Sharyon Deis, NP Referring Physician: Faith Homes, MD  Reason for Consultation:  goals of care  HPI/Patient Profile: 84 y.o. male  with past medical history of pancreatic cancer- on hospice admitted on 01/18/2024 with altered mental status after taking dose of tramadol . Palliative medicine consulted for goals of care.    Primary Decision Maker NEXT OF KIN  Discussion: Chart reviewed including labs, progress notes, imaging from this and previous encounters.  EEG completed- showed diffuse encephalopathy, no evidence of seizures.  Patient is under care of [facility] hospice. Discussed with hospice liaison.  They have discussed GOC with family - per their note GOC stated for IV fluid, neuro workup, d/c home with hospice. Neurology note reviewed- patient mental status improved- no further workup recommended.  On evaluation patient awake, alert, ambulating without assist with PT.  Discussed with family at bedside- confirmed goal is to discharge home with hospice.     SUMMARY OF RECOMMENDATIONS -D/C home with hospice -Recommend no further use of tramadol - will place on patient's allergy list    Code Status/Advance Care Planning:   Code Status: Limited: Do not attempt resuscitation (DNR) -DNR-LIMITED -Do Not Intubate/DNI     Prognosis:   < 6 months  Discharge Planning: Home with Hospice  Primary Diagnoses: Present on Admission:  Acute encephalopathy  Local recurrence of pancreatic cancer (HCC)  Acquired hypothyroidism  Failure to thrive in adult  HLD (hyperlipidemia)  Middle cerebral artery aneurysm  Paroxysmal atrial fibrillation (HCC)  Prolonged QT interval  Hypophosphatemia  AKI (acute kidney injury) (HCC)  Acute metabolic encephalopathy    Physical  Exam Vitals and nursing note reviewed.  Constitutional:      Comments: cachtic  Skin:    Comments: Scattered bruising  Neurological:     Mental Status: He is alert.     Vital Signs: BP 134/84 (BP Location: Left Arm)   Pulse 82   Temp 97.9 F (36.6 C) (Oral)   Resp 16   SpO2 99%  Pain Scale: 0-10   Pain Score: 0-No pain   SpO2: SpO2: 99 % O2 Device:SpO2: 99 % O2 Flow Rate: .   IO: Intake/output summary:  Intake/Output Summary (Last 24 hours) at 01/20/2024 1225 Last data filed at 01/20/2024 0300 Gross per 24 hour  Intake 704.88 ml  Output 300 ml  Net 404.88 ml    LBM: Last BM Date :  (unknown) Baseline Weight:   Most recent weight:         Thank you for this consult. Palliative medicine will continue to follow and assist as needed.   Signed by: Micki Alas, AGNP-C Palliative Medicine  Time includes:   Preparing to see the patient (e.g., review of tests) Obtaining and/or reviewing separately obtained history Performing a medically necessary appropriate examination and/or evaluation Counseling and educating the patient/family/caregiver Ordering medications, tests, or procedures Referring and communicating with other  health care professionals (when not reported separately) Documenting clinical information in the electronic or other health record Independently interpreting results (not reported separately) and communicating results to the patient/family/caregiver Care coordination (not reported separately) Clinical documentation   Please contact Palliative Medicine Team phone at [phone] for questions and concerns.  For individual provider: See Tilford Foley

## 2024-01-20 NOTE — Plan of Care (Signed)
  Problem: Coping: °Goal: Level of anxiety will decrease °Outcome: Progressing °  °Problem: Elimination: °Goal: Will not experience complications related to bowel motility °Outcome: Progressing °  °Problem: Safety: °Goal: Ability to remain free from injury will improve °Outcome: Progressing °  °Problem: Skin Integrity: °Goal: Risk for impaired skin integrity will decrease °Outcome: Progressing °  °

## 2024-01-20 NOTE — Evaluation (Signed)
 Occupational Therapy Evaluation Patient Details Name: Angel DEMAN Sr. MRN: [id number] DOB: 11/26/1940 Today's Date: 01/20/2024   History of Present Illness   Pt is an 83 y.o. male presenting to [facility] ED on 01/18/24 for AMS and stroke like symptoms. Stroke workup negative. 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     Clinical Impressions Pt admitted for above, PTA pt reports being able to perform ADLs independently and overall cognitively sharp, typically ambulates no AD. Pt currently presenting with mild impairments in ability to sequence (seems more for unfamiliar tasks), and impaired balance compared to baseline. Pt benefit from RW support to ambulate with CGA, displays decreased safety awareness and not at his cognitive baseline. Pt needing Mod A to setup A for ADLs. Pt would benefit from continue acute skilled OT services to address listed deficits and help transition to next level of care. Patient would benefit from post acute Home OT services to help maximize functional independence in natural environment       If plan is discharge home, recommend the following:   A little help with bathing/dressing/bathroom;Assistance with cooking/housework;Supervision due to cognitive status;Direct supervision/assist for financial management;Direct supervision/assist for medications management;Assist for transportation     Functional Status Assessment   Patient has had a recent decline in their functional status and/or demonstrates limited ability to make significant improvements in function in a reasonable and predictable amount of time     Equipment Recommendations   BSC/3in1     Recommendations for Other Services         Precautions/Restrictions   Precautions Precautions: Fall Recall of Precautions/Restrictions:  Impaired Restrictions Weight Bearing Restrictions Per Provider Order: No     Mobility Bed Mobility               General bed mobility comments: in recliner on arrival    Transfers Overall transfer level: Needs assistance Equipment used: None Transfers: Sit to/from Stand Sit to Stand: Contact guard assist                  Balance Overall balance assessment: Needs assistance Sitting-balance support: No upper extremity supported Sitting balance-Leahy Scale: Good Sitting balance - Comments: in recliner     Standing balance-Leahy Scale: Fair                             ADL either performed or assessed with clinical judgement   ADL Overall ADL's : Needs assistance/impaired Eating/Feeding: Independent;Sitting   Grooming: Standing;Contact guard assist   Upper Body Bathing: Sitting;Set up   Lower Body Bathing: Sitting/lateral leans;Minimal assistance   Upper Body Dressing : Sitting;Cueing for sequencing;Moderate assistance   Lower Body Dressing: Sit to/from stand;Moderate assistance Lower Body Dressing Details (indicate cue type and reason): min A to don underwear, needs help getting it started Toilet Transfer: Contact guard assist;Rolling walker (2 wheels);Ambulation   Toileting- Clothing Manipulation and Hygiene: Contact guard assist;Sit to/from stand       Functional mobility during ADLs: Contact guard assist;Rolling walker (2 wheels)       Vision   Vision Assessment?: No apparent visual deficits     Perception         Praxis         Pertinent Vitals/Pain Pain Assessment Faces Pain Scale: No hurt Pain Intervention(s): Monitored during session     Extremity/Trunk Assessment  Upper Extremity Assessment Upper Extremity Assessment: Overall WFL for tasks assessed   Lower Extremity Assessment Lower Extremity Assessment: Overall WFL for tasks assessed (MMT scores of >/= 4+/5 bil; sensation intact bil)   Cervical / Trunk  Assessment Cervical / Trunk Assessment: Kyphotic   Communication Communication Communication: Impaired Factors Affecting Communication: Hearing impaired   Cognition Arousal: Alert Behavior During Therapy: WFL for tasks assessed/performed Cognition: Cognition impaired       Memory impairment (select all impairments): Working Biochemist, clinical functioning impairment (select all impairments): Sequencing OT - Cognition Comments: trouble with sequencing donning shirt appropriately. It was a hospital scrub top so pt may have been more confused about it. Decreased safety awareness                 Following commands: Intact       Cueing  General Comments   Cueing Techniques: Verbal cues  Pt limited by diarrhea in session, increased time needed to toilet. Educated pt on the use of call string in bathroom   Exercises     Shoulder Instructions      Home Living Family/patient expects to be discharged to:: Private residence Living Arrangements: Children (daughter) Available Help at Discharge: Family;Available 24 hours/day Type of Home: House Home Access: Stairs to enter [employer] of Steps: 3 Entrance Stairs-Rails: None Home Layout: One level     Bathroom Shower/Tub: Chief Strategy Officer: Standard     Home Equipment: Agricultural consultant (2 wheels);Shower seat          Prior Functioning/Environment Prior Level of Function : Independent/Modified Independent;History of Falls (last six months)             Mobility Comments: No AD; x4 falls in past 6 months ADLs Comments: normally sharp cognitively and able to dress and bathe self per family    OT Problem List: Impaired balance (sitting and/or standing);Decreased cognition   OT Treatment/Interventions: Self-care/ADL training;Patient/family education;Cognitive remediation/compensation;DME and/or AE instruction;Therapeutic activities      OT Goals(Current goals can be found in the care  plan section)   Acute Rehab OT Goals Patient Stated Goal: To go home OT Goal Formulation: With patient/family Time For Goal Achievement: 02/03/24 Potential to Achieve Goals: Good ADL Goals Pt Will Perform Grooming: with supervision;standing Pt Will Perform Lower Body Dressing: sit to/from stand;with contact guard assist Pt Will Transfer to Toilet: with supervision;ambulating Pt Will Perform Toileting - Clothing Manipulation and hygiene: with supervision;sit to/from stand Additional ADL Goal #2: Pt will appropriately sequence 3/3 self care ADLs without cueing   OT Frequency:  Min 2X/week    Co-evaluation              AM-PAC OT "6 Clicks" Daily Activity     Outcome Measure Help from another person eating meals?: None Help from another person taking care of personal grooming?: A Little Help from another person toileting, which includes using toliet, bedpan, or urinal?: A Little Help from another person bathing (including washing, rinsing, drying)?: A Little Help from another person to put on and taking off regular upper body clothing?: A Lot Help from another person to put on and taking off regular lower body clothing?: A Lot 6 Click Score: 17   End of Session Equipment Utilized During Treatment: Gait belt;Rolling walker (2 wheels) Nurse Communication: Mobility status  Activity Tolerance: Patient tolerated treatment well Patient left: Other (comment);with nursing/sitter in room (Pt left on toilet to use restroom)  OT Visit Diagnosis: Unsteadiness on feet (  R26.81);Other abnormalities of gait and mobility (R26.89);Other symptoms and signs involving cognitive function                Time: 1239-1310 OT Time Calculation (min): 31 min Charges:  OT General Charges $OT Visit: 1 Visit OT Evaluation $OT Eval Low Complexity: 1 Low OT Treatments $Self Care/Home Management : 8-22 mins  01/20/2024  AB, OTR/L  Acute Rehabilitation Services  Office: [phone]   Jorene New 01/20/2024, 1:48 PM

## 2024-01-20 NOTE — Discharge Summary (Signed)
 Physician Discharge Summary   Angel Keller ZOX:[id number] DOB: 19-Oct-1940 DOA: 01/18/2024  PCP: Sharyon Deis, NP  Admit date: 01/18/2024 Discharge date: 01/20/2024  Admitted From: Home Disposition:  Home Discharging physician: Faith Homes, MD Barriers to discharge: none  Recommendations at discharge: Continue with home hospice  Home Health: PT, OT Equipment/Devices: none  Discharge Condition: stable CODE STATUS: DNR Diet recommendation:  Diet Orders (From admission, onward)     Start     Ordered   01/20/24 0751  DIET DYS 2 Room service appropriate? Yes with Assist; Fluid consistency: Thin  Diet effective now       Comments: Meds whole in puree, has dentures but needs adhesive to safely wear in mouth  Question Answer Comment  Room service appropriate? Yes with Assist   Fluid consistency: Thin      01/20/24 0751            Hospital Course: Angel Keller is an 83 year old male with history of pancreatic cancer status post Whipple procedure, failure to thrive, hypertension.  He lives at home with daughter.  Patient was recently complaining of pain on his jaw.  He is followed by home hospice.  He was recently started on tramadol  and had taken 1 dose prior to hospitalization.  Upon going to lunch with his son he began developing lethargy and confusion. He was brought to the hospital for further evaluation. MRI brain was negative for acute findings although slightly motion degraded.  EEG was unremarkable. It was suspected that tramadol  may have contributed to his altered mentation from oversedation.  He is also prescribed Ativan  however has not yet started taking it and caution was stressed to family over future administration of this. He regained normal mentation after monitoring overnight and was felt to be back to his prior mental baseline. He was evaluated by PT and OT prior to discharge with recommendations for home health.  He will be resumed on hospice upon return  home.    Principal Diagnosis: Acute metabolic encephalopathy  Discharge Diagnoses: Active Hospital Problems   Diagnosis Date Noted   Acute metabolic encephalopathy 01/19/2024    Priority: 1.   Failure to thrive in adult 02/21/2023    Priority: 2.   Local recurrence of pancreatic cancer (HCC) 08/14/2021    Priority: 2.   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     Discharge Instructions     Increase activity slowly   Complete by: As directed    No wound care   Complete by: As directed       Allergies as of 01/20/2024       Reactions   Tramadol  Other (See Comments)   Altered mental status        Medication List     STOP taking these medications    oxybutynin  10 MG 24 hr tablet Commonly known as: Ditropan  XL   traMADol  50 MG tablet Commonly known as: ULTRAM        TAKE these medications    amLODipine  5 MG tablet Commonly known as: NORVASC  Take 1 tablet (5 mg total) by mouth in the morning. What changed: Another medication with the same name was removed. Continue taking this medication, and follow the directions you  see here.   escitalopram  20 MG tablet Commonly known as: LEXAPRO  Take 1 tablet (20 mg total) by mouth daily.   ipratropium-albuterol  0.5-2.5 (3) MG/3ML Soln Commonly known as: DUONEB Take 3 mLs by nebulization every 6 (six) hours as needed (shortness of breath).   levothyroxine  112 MCG tablet Commonly known as: SYNTHROID  Take 1 tablet (112 mcg total) by mouth daily before breakfast.   lidocaine -prilocaine  cream Commonly known as: EMLA  Apply topically to affected area(s) as needed.   LORazepam  0.5 MG tablet Commonly known as: Ativan  Take  1 tablet (0.5 mg total) by mouth every 6 (six) hours as needed for nausea or anxiety   omeprazole  20 MG capsule Commonly known as: PRILOSEC Take 20 mg by mouth 2 (two) times daily.   Pancreaze  2600-8800 units Cpep Generic drug: Pancrelipase  (Lip-Prot-Amyl) Take 1 capsule by mouth as directed Take with meals What changed: Another medication with the same name was removed. Continue taking this medication, and follow the directions you see here.   prochlorperazine  10 MG tablet Commonly known as: COMPAZINE  Take 10 mg by mouth every 4 (four) hours as needed.   promethazine  25 MG tablet Commonly known as: PHENERGAN  Take 1 tablet (25 mg total) by mouth every 6 (six) hours as needed for nausea/vomiting               Durable Medical Equipment  (From admission, onward)           Start     Ordered   01/20/24 1317  For home use only DME Bedside commode  Once       Question:  Patient needs a bedside commode to treat with the following condition  Answer:  Generalized muscle weakness   01/20/24 1316            Follow-up Information     Sharyon Deis, NP. Call in 1 week(s).   Specialty: Family Medicine Contact information: [address] [place] [address] [phone]                Allergies  Allergen Reactions   Tramadol  Other (See Comments)    Altered mental status    Consultations:   Procedures:   Discharge Exam: BP 134/84 (BP Location: Left Arm)   Pulse 82   Temp 97.9 F (36.6 C) (Oral)   Resp 16   SpO2 99%  Physical Exam Constitutional:      General: He is not in acute distress.    Appearance: Normal appearance.  HENT:     Head: Normocephalic and atraumatic.     Mouth/Throat:     Mouth: Mucous membranes are moist.  Eyes:     Extraocular Movements: Extraocular movements intact.  Cardiovascular:     Rate and Rhythm: Normal rate and regular rhythm.  Pulmonary:     Effort: Pulmonary effort is normal. No respiratory  distress.     Breath sounds: Normal breath sounds. No wheezing.  Abdominal:     General: Bowel sounds are normal. There is no distension.     Palpations: Abdomen is soft.     Tenderness: There is no abdominal tenderness.  Musculoskeletal:        General: Normal range of motion.     Cervical back: Normal range of motion and neck supple.  Skin:    General: Skin is warm and dry.  Neurological:     General: No focal deficit present.     Mental Status: He is alert.  Psychiatric:  Mood and Affect: Mood normal.        Behavior: Behavior normal.      The results of significant diagnostics from this hospitalization (including imaging, microbiology, ancillary and laboratory) are listed below for reference.   Microbiology: No results found for this or any previous visit (from the past 240 hours).   Labs: BNP (last 3 results) Recent Labs    02/20/23 1800  BNP 106.0*   Basic Metabolic Panel: Recent Labs  Lab 01/18/24 1757 01/18/24 1803 01/19/24 1442  NA 137 137 132*  K 3.2* 3.2* 3.6  CL 102 105 99  CO2 17*  --  23  GLUCOSE 139* 136* 103*  BUN 20 17 13   CREATININE 1.80* 1.80* 1.02  CALCIUM  9.5  --  8.9  MG 2.3  --  2.1  PHOS 2.1*  --  3.4   Liver Function Tests: Recent Labs  Lab 01/18/24 1757 01/19/24 1442  AST 33 28  ALT 14 13  ALKPHOS 109 104  BILITOT 0.8 0.8  PROT 7.9 7.5  ALBUMIN  4.1 3.8   No results for input(s): "LIPASE", "AMYLASE" in the last 168 hours. Recent Labs  Lab 01/18/24 1800  AMMONIA 14   CBC: Recent Labs  Lab 01/18/24 1757 01/18/24 1803 01/19/24 1442  WBC 10.3  --  5.7  NEUTROABS 3.9  --   --   HGB 10.5* 11.2* 11.1*  HCT 33.3* 33.0* 34.4*  MCV 77.3*  --  76.4*  PLT 145*  --  123*   Cardiac Enzymes: Recent Labs  Lab 01/19/24 0444  CKTOTAL 137   BNP: Invalid input(s): "POCBNP" CBG: Recent Labs  Lab 01/18/24 1756  GLUCAP 145*   D-Dimer No results for input(s): "DDIMER" in the last 72 hours. Hgb A1c No results for  input(s): "HGBA1C" in the last 72 hours. Lipid Profile No results for input(s): "CHOL", "HDL", "LDLCALC", "TRIG", "CHOLHDL", "LDLDIRECT" in the last 72 hours. Thyroid  function studies Recent Labs    01/18/24 2158  TSH 0.444   Anemia work up No results for input(s): "VITAMINB12", "FOLATE", "FERRITIN", "TIBC", "IRON", "RETICCTPCT" in the last 72 hours. Urinalysis    Component Value Date/Time   COLORURINE YELLOW 01/18/2024 2035   APPEARANCEUR CLEAR 01/18/2024 2035   LABSPEC 1.031 (H) 01/18/2024 2035   PHURINE 5.0 01/18/2024 2035   GLUCOSEU NEGATIVE 01/18/2024 2035   GLUCOSEU NEGATIVE 11/07/2008 0000   HGBUR SMALL (A) 01/18/2024 2035   BILIRUBINUR NEGATIVE 01/18/2024 2035   KETONESUR 5 (A) 01/18/2024 2035   PROTEINUR NEGATIVE 01/18/2024 2035   UROBILINOGEN 1.0 07/09/2009 2019   NITRITE NEGATIVE 01/18/2024 2035   LEUKOCYTESUR TRACE (A) 01/18/2024 2035   Sepsis Labs Recent Labs  Lab 01/18/24 1757 01/19/24 1442  WBC 10.3 5.7   Microbiology No results found for this or any previous visit (from the past 240 hours).  Procedures/Studies: EEG adult Result Date: 01/19/2024 Arleene Lack, MD     01/19/2024  5:27 PM Patient Name: Angel Passer Sr. MRN: [id number] Epilepsy Attending: Arleene Lack Referring Physician/Provider: Doutova, Anastassia, MD Date: 01/19/2024 Duration: 24.02 mins Patient history: 83yo M with ams. EEG to evaluate for seizure. Level of alertness: Awake, asleep AEDs during EEG study: None Technical aspects: This EEG study was done with scalp electrodes positioned according to the 10-20 International system of electrode placement. Electrical activity was reviewed with band pass filter of 1-70Hz , sensitivity of 7 uV/mm, display speed of 41mm/sec with a 60Hz  notched filter applied as appropriate. EEG data were recorded continuously and  digitally stored.  Video monitoring was available and reviewed as appropriate. Description: The posterior dominant rhythm consists of 8 Hz  activity of moderate voltage (25-35 uV) seen predominantly in posterior head regions, symmetric and reactive to eye opening and eye closing.  Sleep was characterized by vertex waves, sleep spindles (12 to 14 Hz), maximal frontocentral region. EEG showed intermittent generalized 3 to 6 Hz theta-delta slowing. Hyperventilation and photic stimulation were not performed.   ABNORMALITY - Intermittent slow, generalized IMPRESSION: This study is suggestive of mild diffuse encephalopathy. No seizures or epileptiform discharges were seen throughout the recording. Arleene Lack   MR BRAIN WO CONTRAST Result Date: 01/19/2024 CLINICAL DATA:  83 year old male with slurred speech and dizziness, TIA, right MCA stent. EXAM: MRI HEAD WITHOUT CONTRAST TECHNIQUE: Multiplanar, multiecho pulse sequences of the brain and surrounding structures were obtained without intravenous contrast. COMPARISON:  CT head and CTA head and neck yesterday. Brain MRI 09/16/2022. FINDINGS: Study is intermittently degraded by motion artifact despite repeated imaging attempts. Brain: No restricted diffusion to suggest acute infarction. No midline shift, mass effect, evidence of mass lesion, ventriculomegaly, extra-axial collection or acute intracranial hemorrhage. Cervicomedullary junction and pituitary are within normal limits. Patchy area of chronic encephalomalacia in the right PCA territory, medial right occipital lobe. Scattered additional mild to moderate for age chronic white matter signal changes. Comparatively mild signal heterogeneity in the bilateral deep gray nuclei, with expected evolution of the right posterior lacunar infarcts seen last year. No convincing chronic cerebral blood products. Asymmetric right MCA stent related susceptibility. Vascular: Major intracranial vascular flow voids appear stable since last year when allowing for motion artifact. Some chronic intracranial artery tortuosity. Skull and upper cervical spine: Visualized  bone marrow signal is within normal limits. Mild for age upper cervical spine degeneration is visible. Sinuses/Orbits: Stable.  Minor paranasal sinus mucosal thickening. Other: Grossly negative scalp and face. IMPRESSION: 1. Intermittently motion degraded exam. No acute intracranial abnormality identified. 2. Stable appearance of chronic ischemic disease since last year. Electronically Signed   By: Marlise Simpers M.D.   On: 01/19/2024 08:28   CT ANGIO HEAD NECK W WO CM Result Date: 01/18/2024 CLINICAL DATA:  Stroke/TIA, determine embolic source EXAM: CT ANGIOGRAPHY HEAD AND NECK WITH AND WITHOUT CONTRAST TECHNIQUE: Multidetector CT imaging of the head and neck was performed using the standard protocol during bolus administration of intravenous contrast. Multiplanar CT image reconstructions and MIPs were obtained to evaluate the vascular anatomy. Carotid stenosis measurements (when applicable) are obtained utilizing NASCET criteria, using the distal internal carotid diameter as the denominator. RADIATION DOSE REDUCTION: This exam was performed according to the departmental dose-optimization program which includes automated exposure control, adjustment of the mA and/or kV according to patient size and/or use of iterative reconstruction technique. CONTRAST:  60mL OMNIPAQUE  IOHEXOL  350 MG/ML SOLN COMPARISON:  Same day CT head. FINDINGS: CTA NECK FINDINGS Aortic arch: Great vessel origins are not fully imaged. Aortic atherosclerosis. Right carotid system: Atherosclerosis at the carotid bifurcation and involving the proximal ICA with approximately 50% stenosis of the proximal ICA. Left carotid system: The common carotid artery origin is not imaged. No visible significant (greater than 50% stenosis). Vertebral arteries: Codominant. Patent bilaterally. Moderate left V2 vertebral artery stenosis. Skeleton: No evidence of acute abnormality on limited assessment. Multilevel degenerative change in the cervical spine. Other neck: No  acute abnormality on limited assessment. Upper chest: Visualized lung apices are clear. Review of the MIP images confirms the above findings CTA HEAD FINDINGS Anterior circulation: Bilateral  intracranial ICAs, MCAs, and ACAs are patent. There is a stent within the right M1 MCA which may be narrowed along its mid to distal portion but remains patent. Posterior circulation: Bilateral intradural vertebral arteries, basilar artery and bilateral posterior cerebral arteries are patent without proximal hemodynamically significant stenosis. Venous sinuses: As permitted by contrast timing, patent. Review of the MIP images confirms the above findings IMPRESSION: 1. No emergent large vessel occlusion. 2. Patent right M1 MCA stent. Suspected narrowing along the mid to distal portions of the stent, not well quantified by CTA. Catheter arteriogram could further evaluate if clinically warranted. 3. 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 4. Moderate left V2 vertebral artery stenosis. 5. Aortic Atherosclerosis (ICD10-I70.0). Electronically Signed   By: Stevenson Elbe M.D.   On: 01/18/2024 21:05   CT CHEST ABDOMEN PELVIS WO CONTRAST Result Date: 01/18/2024 EXAM: CT CHEST, ABDOMEN AND PELVIS WITHOUT CONTRAST 01/18/2024 07:19:18 PM TECHNIQUE: CT of the chest, abdomen and pelvis was performed without the administration of intravenous contrast. Multiplanar reformatted images are provided for review. Automated exposure control, iterative reconstruction, and/or weight based adjustment of the mA/kV was utilized to reduce the radiation dose to as low as reasonably achievable. COMPARISON: MR abdomen dated 02/13/2023 and CT chest abdomen and pelvis dated 12/19/2022. CLINICAL HISTORY: Sepsis. FINDINGS: CHEST: MEDIASTINUM: Right chest port terminates in the mid SVC. Moderate 3-vessel coronary atherosclerosis. Thoracic aortic atherosclerosis. THORACIC LYMPH NODES: No mediastinal, hilar or axillary lymphadenopathy. LUNGS AND  PLEURA: Faint subpleural ground-glass opacities in the lungs bilaterally (for example, image 72), suggesting postinfectious/inflammatory scarring. No pleural effusion or pneumothorax. ABDOMEN AND PELVIS: LIVER: No intrahepatic duct dilatation. Mild pneumobilia. GALLBLADDER AND BILE DUCTS: Status post cholecystectomy with choledojejunostomy. Associated mild pneumobilia. SPLEEN: No acute abnormality. PANCREAS: Status post partial pancreatectomy with pancreaticojejunal anastomosis. No mass/abnormality of the residual pancreas. ADRENAL GLANDS: No acute abnormality. KIDNEYS, URETERS AND BLADDER: No stones in the kidneys or ureters. No hydronephrosis. No perinephric or periureteral stranding. Urinary bladder is unremarkable. GI AND BOWEL: Status post distal gastrectomy with patent gastrojejunostomy. No evidence of bowel obstruction. Normal appendix (image 90). Moderate rectal stool burden (image 103), suggesting mild fecal impaction, without stercoral colitis. Mild sigmoid diverticulosis, without evidence of diverticulitis. REPRODUCTIVE ORGANS: No acute abnormality. PERITONEUM AND RETROPERITONEUM: No ascites. No free air. VASCULATURE: Atherosclerotic calcifications of the abdominal aorta and branch vessels. ABDOMINAL AND PELVIS LYMPH NODES: No lymphadenopathy. REPRODUCTIVE ORGANS: No acute abnormality. BONES AND SOFT TISSUES: Moderate degenerative changes of the visualized thoracolumbar spine. No focal soft tissue abnormality. IMPRESSION: 1. Postsurgical changes related to whipple procedure. No evidence of recurrent or metastatic disease. 2. Suspected mild fecal impaction, without stercoral colitis. 3. Additional ancillary findings as above. Electronically signed by: Zadie Herter MD 01/18/2024 07:42 PM EDT RP Workstation: [id number]   CT Head Wo Contrast Result Date: 01/18/2024 CLINICAL DATA:  Dizziness. EXAM: CT HEAD WITHOUT CONTRAST TECHNIQUE: Contiguous axial images were obtained from the base of the skull  through the vertex without intravenous contrast. RADIATION DOSE REDUCTION: This exam was performed according to the departmental dose-optimization program which includes automated exposure control, adjustment of the mA and/or kV according to patient size and/or use of iterative reconstruction technique. COMPARISON:  February 17, 2023 FINDINGS: Brain: There is generalized cerebral atrophy with widening of the extra-axial spaces and ventricular dilatation. There are areas of decreased attenuation within the white matter tracts of the supratentorial brain, consistent with microvascular disease changes. A small chronic right occipital lobe infarct is noted. Vascular: Marked severity bilateral cavernous  carotid artery calcification is noted. A right MCA stent is in place. Skull: Normal. Negative for fracture or focal lesion. Sinuses/Orbits: No acute finding. Other: None. IMPRESSION: 1. Generalized cerebral atrophy and microvascular disease changes of the supratentorial brain. 2. Small chronic right occipital lobe infarct. 3. No acute intracranial abnormality. 4. Right MCA stent. Electronically Signed   By: Virgle Grime M.D.   On: 01/18/2024 18:45     Time coordinating discharge: Over 30 minutes    Faith Homes, MD  [facility] 01/20/2024, 1:24 PM

## 2024-01-20 NOTE — Consult Note (Signed)
 NEUROLOGY CONSULT NOTE   Date of service: January 20, 2024 Patient Name: Angel LANZER Sr. MRN:  [id number] DOB:  October 05, 1940 Chief Complaint: "aphasia, altered mental status" Requesting Provider: Faith Homes, MD  History of Present Illness  Tijuan Dantes. is a 83 y.o. male with hx of home hospice care (not enrolled in end-of-life care), pancreatic cancer s/p Whipple procedure, failure to thrive, HTN who presented to [facility] ED due to acute onset of slurred speech and also dizziness that had lasted all day.  In ED, patient was confused and very lethargic. Stroke workup/imaging completed at [facility] was negative.  Patient did take 1 dose of tramadol  approximately 24 hours before the symptom onset.  No seizure-like activity was witnessed, but seizures were in the differential and patient was transferred over to [facility] due to possible need for long-term EEG monitoring.   On neurology exam this morning, patient is sleeping but easily awakened. He is oriented to place and time. Remembers coming into the hospital. Follows commands. States that he feels much better than before coming to the hospital.     ROS  Comprehensive ROS performed and pertinent positives documented in HPI   Past History   Past Medical History:  Diagnosis Date   Anxiety    Arthritis    Depression    Family history of breast cancer    Family history of lung cancer    Headache    HLD (hyperlipidemia)    Hx of inguinal hernia repair    Hypertension    Hypothyroidism    Nausea with vomiting 09/21/2021   Overactive bladder    Pancreatic cancer (HCC)    Pneumonia    Shingles    Sleep apnea    Patient denies   Thoracic ascending aortic aneurysm (HCC)    mildly dilated 4.0cm per 08/12/21 CT   Thyroid  disease    Varicose veins of both lower extremities     Past Surgical History:  Procedure Laterality Date   BILATERAL CARPAL TUNNEL RELEASE     CATARACT EXTRACTION, BILATERAL Bilateral    ELBOW SURGERY Bilateral     ENDOSCOPIC RETROGRADE CHOLANGIOPANCREATOGRAPHY (ERCP) WITH PROPOFOL  N/A 07/20/2021   Procedure: ENDOSCOPIC RETROGRADE CHOLANGIOPANCREATOGRAPHY (ERCP) WITH PROPOFOL ;  Surgeon: Kenney Peacemaker, MD;  Location: [facility] ENDOSCOPY;  Service: Endoscopy;  Laterality: N/A;   ESOPHAGOGASTRODUODENOSCOPY (EGD) WITH PROPOFOL  N/A 08/05/2021   Procedure: ESOPHAGOGASTRODUODENOSCOPY (EGD) WITH PROPOFOL ;  Surgeon: Brice Campi Albino Alu., MD;  Location: [facility] ENDOSCOPY;  Service: Gastroenterology;  Laterality: N/A;   EUS N/A 08/05/2021   Procedure: UPPER ENDOSCOPIC ULTRASOUND (EUS) RADIAL;  Surgeon: Normie Becton., MD;  Location: [facility] ENDOSCOPY;  Service: Gastroenterology;  Laterality: N/A;   EYE SURGERY     bilateral cataracts   FINE NEEDLE ASPIRATION  08/05/2021   Procedure: FINE NEEDLE ASPIRATION (FNA) LINEAR;  Surgeon: Normie Becton., MD;  Location: [facility] ENDOSCOPY;  Service: Gastroenterology;;   INGUINAL HERNIA REPAIR Right 1951   IR ANGIO INTRA EXTRACRAN SEL COM CAROTID INNOMINATE BILAT MOD SED  02/20/2019   IR BILIARY STENT(S) EXISTING ACCESS INC DILATION CATH EXCHANGE  09/20/2021   IR ENDOLUMINAL BX OF BILIARY TREE  07/22/2021   IR GENERIC HISTORICAL  10/28/2016   IR RADIOLOGIST EVAL & MGMT 10/28/2016 [facility]INTER[facility] RAD   IR INT EXT BILIARY DRAIN WITH CHOLANGIOGRAM  07/22/2021   JOINT REPLACEMENT     LAPAROSCOPY N/A 11/15/2021   Procedure: LAPAROSCOPIC DIAGNOSTIC STAGING;  Surgeon: Lujean Sake, MD;  Location: [facility] OR;  Service: General;  Laterality: N/A;   PORTACATH PLACEMENT N/A 08/19/2021   Procedure: INSERTION PORT-A-CATH;  Surgeon: Lujean Sake, MD;  Location: [facility] ORS;  Service: General;  Laterality: N/A;  LMA   RADIOLOGY WITH ANESTHESIA N/A 10/21/2015   Procedure: RADIOLOGY WITH ANESTHESIA;  Surgeon: Luellen Sages, MD;  Location: [facility] OR;  Service: Radiology;  Laterality: N/A;   RADIOLOGY WITH ANESTHESIA N/A 02/20/2019   Procedure: Rich Champ;  Surgeon: Luellen Sages, MD;  Location:  [facility] OR;  Service: Radiology;  Laterality: N/A;   ROTATOR CUFF REPAIR Bilateral    VARICOSE VEIN SURGERY     WHIPPLE PROCEDURE N/A 11/15/2021   Procedure: WHIPPLE PROCEDURE;  Surgeon: Lujean Sake, MD;  Location: [facility] OR;  Service: General;  Laterality: N/A;    Family History: Family History  Problem Relation Age of Onset   Breast cancer Mother    Lung cancer Father    Alcoholism Sister    Cirrhosis Sister    Colon cancer Neg Hx    Esophageal cancer Neg Hx    Liver cancer Neg Hx    Pancreatic cancer Neg Hx    Prostate cancer Neg Hx     Social History  reports that he quit smoking about 62 years ago. His smoking use included cigarettes. He started smoking about 63 years ago. He has a 0.3 pack-year smoking history. He has never used smokeless tobacco. He reports that he does not drink alcohol and does not use drugs.  No Known Allergies  Medications   Current Facility-Administered Medications:    acetaminophen  (TYLENOL ) tablet 650 mg, 650 mg, Oral, Q6H PRN **OR** acetaminophen  (TYLENOL ) suppository 650 mg, 650 mg, Rectal, Q6H PRN, Doutova, Anastassia, MD   albuterol  (PROVENTIL ) (2.5 MG/3ML) 0.083% nebulizer solution 2.5 mg, 2.5 mg, Nebulization, Q2H PRN, Doutova, Anastassia, MD   hydrALAZINE  (APRESOLINE ) injection 10 mg, 10 mg, Intravenous, Q6H PRN, Mansy, Jan A, MD, 10 mg at 01/19/24 2221   HYDROcodone -acetaminophen  (NORCO/VICODIN) 5-325 MG per tablet 1-2 tablet, 1-2 tablet, Oral, Q4H PRN, Doutova, Anastassia, MD   levothyroxine  (SYNTHROID ) tablet 112 mcg, 112 mcg, Oral, QAC breakfast, Doutova, Anastassia, MD   thiamine  (VITAMIN B1) injection 100 mg, 100 mg, Intravenous, Daily, Doutova, Anastassia, MD, 100 mg at 01/20/24 0739  Vitals   Vitals:   01/19/24 2100 01/20/24 0005 01/20/24 0355 01/20/24 0752  BP: (!) 182/96 (P) 128/73 (!) 148/78 126/83  Pulse: 66 (P) 81 80 80  Resp: 14 (P) 19    Temp: 98.2 F (36.8 C) (P) 98.3 F (36.8 C) (!) 97.5 F (36.4 C) (!) 97.5 F (36.4 C)   TempSrc:  (P) Oral Oral Oral  SpO2: 99% (P) 99% 99% 98%    There is no height or weight on file to calculate BMI.   Physical Exam   Constitutional: Appears chronically ill Cardiovascular: well-perfused Respiratory: Effort normal, non-labored breathing.   Neurologic Examination   Patient is drowsy but easily awakens.  Oriented to self, place, month, year.  Answers questions appropriately.  Follows commands.  CN II-XII grossly intact Spontaneous movement of all extremities with generalized weakness FNF slow but intact Sensory subjectively intact throughout  Labs/Imaging/Neurodiagnostic studies   CBC:  Recent Labs  Lab 2024-01-23 1757 Jan 23, 2024 1803 01/19/24 1442  WBC 10.3  --  5.7  NEUTROABS 3.9  --   --   HGB 10.5* 11.2* 11.1*  HCT 33.3* 33.0* 34.4*  MCV 77.3*  --  76.4*  PLT 145*  --  123*   Basic Metabolic Panel:  Lab Results  Component  Value Date   NA 132 (L) 01/19/2024   K 3.6 01/19/2024   CO2 23 01/19/2024   GLUCOSE 103 (H) 01/19/2024   BUN 13 01/19/2024   CREATININE 1.02 01/19/2024   CALCIUM  8.9 01/19/2024   GFRNONAA >60 01/19/2024   GFRAA 55 (L) 02/20/2019   Lipid Panel:  Lab Results  Component Value Date   LDLCALC 61 09/17/2022   HgbA1c:  Lab Results  Component Value Date   HGBA1C 5.2 09/16/2022   Urine Drug Screen:     Component Value Date/Time   LABOPIA NONE DETECTED 01/18/2024 2035   COCAINSCRNUR NONE DETECTED 01/18/2024 2035   LABBENZ NONE DETECTED 01/18/2024 2035   AMPHETMU NONE DETECTED 01/18/2024 2035   THCU NONE DETECTED 01/18/2024 2035   LABBARB NONE DETECTED 01/18/2024 2035    Alcohol Level     Component Value Date/Time   ETH <10 09/16/2022 1215   INR  Lab Results  Component Value Date   INR 1.3 (H) 01/18/2024   APTT  Lab Results  Component Value Date   APTT >200 (HH) 09/16/2022    CT Head without contrast(Personally reviewed): Generalized cerebral atrophy and microvascular disease changes of the supratentorial  brain Small chronic right occipital lobe infarct No acute intracranial abnormality Right MCA stent  CT angio Head and Neck with contrast(Personally reviewed): No emergent LVO Patent right M1 stent Suspected narrowing along mid to distal portions of stent, not well quantified by CTA Approximately 50% stenosis of proximal right ICA Moderate left V2 stenosis  MRI Brain(Personally reviewed): Intermittently motion degraded exam No acute intracranial abnormality Stable appearance of chronic ischemic disease since last years imaging  CT chest, abdomen, pelvis: No evidence of metastasis found.  Neurodiagnostics rEEG 6/6:  Intermittent slow, generalized.  Suggestive of mild diffuse encephalopathy.  No seizures or epileptiform discharges seen throughout recording  ASSESSMENT   Guy Seese. is a 83 y.o. male with hx of home hospice care, pancreatic cancer s/p Whipple, failure to thrive, HTN who presented with slurred speech and dizziness. In ED, patient was confused and very lethargic.   Stroke workup/imaging completed at [facility] was negative.  Patient did take 1 dose of tramadol  approximately 24 hours before the symptom onset.  No seizure-like activity was witnessed.As [patient's symptoms have improved without any anti-seizure interventions, routine EEG was negative, no further EEG monitoring is indicated at this time.   On neurology exam this morning, patient is sleeping but easily awakened. He is oriented to place and time. Remembers coming into the hospital. Follows commands. States that he feels much better than before coming to the hospital. Patient states that he takes tramadol  sometimes, "but not that much" for pain and does remember taking it prior to feeling dizzy. It is likely that his lethargy and altered mental status were due to taking this medication, as the symptoms have resolved with time over the past 2 days and labs have relatively been WNL, with no correlating values  for encephalopathy. As patient is currently an in-home hospice patient, further encephalopathy workup is not indicated as it would not change the management and treatment plan.   Na 132, K 3.6, Glucose 103, Cr 1.02, LFTs WNL, Mag 2.1, CK WNL, Ammonia WNL, UA negative B1 pending  Impression: Tramadol -induced altered mental status versus acute toxic-metabolic encephalopathy  RECOMMENDATIONS    - Decrease home tramadol  dose by half  - f/u with PCP for pending lab and to explore alternative pain medication  Neurology will sign off. Please recall with  further needs. Thank you for this consult.   ______________________________________________________________________   Signed, Audrene Lease, NP [facility]

## 2024-01-20 NOTE — Evaluation (Signed)
 Clinical/Bedside Swallow Evaluation Patient Details  Name: Angel SODERQUIST Sr. MRN: [id number] Date of Birth: 05-Aug-1941  Today's Date: 01/20/2024 Time: SLP Start Time (ACUTE ONLY): 0731 SLP Stop Time (ACUTE ONLY): 0750 SLP Time Calculation (min) (ACUTE ONLY): 19 min  Past Medical History:  Past Medical History:  Diagnosis Date   Anxiety    Arthritis    Depression    Family history of breast cancer    Family history of lung cancer    Headache    HLD (hyperlipidemia)    Hx of inguinal hernia repair    Hypertension    Hypothyroidism    Nausea with vomiting 09/21/2021   Overactive bladder    Pancreatic cancer (HCC)    Pneumonia    Shingles    Sleep apnea    Patient denies   Thoracic ascending aortic aneurysm (HCC)    mildly dilated 4.0cm per 08/12/21 CT   Thyroid  disease    Varicose veins of both lower extremities    Past Surgical History:  Past Surgical History:  Procedure Laterality Date   BILATERAL CARPAL TUNNEL RELEASE     CATARACT EXTRACTION, BILATERAL Bilateral    ELBOW SURGERY Bilateral    ENDOSCOPIC RETROGRADE CHOLANGIOPANCREATOGRAPHY (ERCP) WITH PROPOFOL  N/A 07/20/2021   Procedure: ENDOSCOPIC RETROGRADE CHOLANGIOPANCREATOGRAPHY (ERCP) WITH PROPOFOL ;  Surgeon: Kenney Peacemaker, MD;  Location: [facility] ENDOSCOPY;  Service: Endoscopy;  Laterality: N/A;   ESOPHAGOGASTRODUODENOSCOPY (EGD) WITH PROPOFOL  N/A 08/05/2021   Procedure: ESOPHAGOGASTRODUODENOSCOPY (EGD) WITH PROPOFOL ;  Surgeon: Brice Campi Albino Alu., MD;  Location: [facility] ENDOSCOPY;  Service: Gastroenterology;  Laterality: N/A;   EUS N/A 08/05/2021   Procedure: UPPER ENDOSCOPIC ULTRASOUND (EUS) RADIAL;  Surgeon: Normie Becton., MD;  Location: [facility] ENDOSCOPY;  Service: Gastroenterology;  Laterality: N/A;   EYE SURGERY     bilateral cataracts   FINE NEEDLE ASPIRATION  08/05/2021   Procedure: FINE NEEDLE ASPIRATION (FNA) LINEAR;  Surgeon: Normie Becton., MD;  Location: [facility] ENDOSCOPY;  Service:  Gastroenterology;;   INGUINAL HERNIA REPAIR Right 1951   IR ANGIO INTRA EXTRACRAN SEL COM CAROTID INNOMINATE BILAT MOD SED  02/20/2019   IR BILIARY STENT(S) EXISTING ACCESS INC DILATION CATH EXCHANGE  09/20/2021   IR ENDOLUMINAL BX OF BILIARY TREE  07/22/2021   IR GENERIC HISTORICAL  10/28/2016   IR RADIOLOGIST EVAL & MGMT 10/28/2016 [facility]   IR INT EXT BILIARY DRAIN WITH CHOLANGIOGRAM  07/22/2021   JOINT REPLACEMENT     LAPAROSCOPY N/A 11/15/2021   Procedure: LAPAROSCOPIC DIAGNOSTIC STAGING;  Surgeon: Lujean Sake, MD;  Location: [facility] OR;  Service: General;  Laterality: N/A;   PORTACATH PLACEMENT N/A 08/19/2021   Procedure: INSERTION PORT-A-CATH;  Surgeon: Lujean Sake, MD;  Location: [facility] ORS;  Service: General;  Laterality: N/A;  LMA   RADIOLOGY WITH ANESTHESIA N/A 10/21/2015   Procedure: RADIOLOGY WITH ANESTHESIA;  Surgeon: Luellen Sages, MD;  Location: [facility] OR;  Service: Radiology;  Laterality: N/A;   RADIOLOGY WITH ANESTHESIA N/A 02/20/2019   Procedure: Rich Champ;  Surgeon: Luellen Sages, MD;  Location: [facility] OR;  Service: Radiology;  Laterality: N/A;   ROTATOR CUFF REPAIR Bilateral    VARICOSE VEIN SURGERY     WHIPPLE PROCEDURE N/A 11/15/2021   Procedure: WHIPPLE PROCEDURE;  Surgeon: Lujean Sake, MD;  Location: [facility] OR;  Service: General;  Laterality: N/A;   HPI:  83 year old man with history of pancreatic cancer status post Whipple procedure, failure to thrive, hypertension.  He lives at home with daughter.  Patient was recently complaining of  pain on his jaw.  He is followed by home hospice.  Patient was taken to lunch by his son yesterday afternoon, he started slurring and holding his head and insist on going home.  He was almost lethargic to the point unable to keep awake so son drove him straight to the ER. In the emergency room significantly confused agitated.  Stroke workup negative.  Admitted with altered mental status.  Had taken 1 dose of tramadol  about 24 hours  before the symptom onset.    Assessment / Plan / Recommendation  Clinical Impression  Pt with improved alertness this date and able to participate in clinical swallow evaluation at bedside. Xerostomia noted with dried labial and palatal secretions improving post oral care by SLP. Pt has upper and lower dentures which were at bedside. Attempted to place however dentures, poorly fitting; very very loose. No denture adhesive present; will coordinate with family for request to bring denture adhesive. Pt consumed ice chips, thin liquids via cup, puree, and soft solid without overt s/sx of aspiration. Solid noted with prolonged mastication due to edentulous status. Recommend dysphagia 2 (finely chopped) and thin liquids with meds whole in puree. Full supervision with PO in setting of fluctuating mental status. SLP to closely monitor.  SLP Visit Diagnosis: Dysphagia, oral phase (R13.11)    Aspiration Risk  Mild aspiration risk    Diet Recommendation   Thin;Dysphagia 2 (chopped)  Medication Administration: Whole meds with puree    Other  Recommendations Oral Care Recommendations: Oral care BID     Assistance Recommended at Discharge    Functional Status Assessment Patient has had a recent decline in their functional status and demonstrates the ability to make significant improvements in function in a reasonable and predictable amount of time.  Frequency and Duration min 1 x/week  1 week       Prognosis Prognosis for improved oropharyngeal function: Good Barriers to Reach Goals: Time post onset      Swallow Study   General Date of Onset: 01/18/24 HPI: 83 year old man with history of pancreatic cancer status post Whipple procedure, failure to thrive, hypertension.  He lives at home with daughter.  Patient was recently complaining of pain on his jaw.  He is followed by home hospice.  Patient was taken to lunch by his son yesterday afternoon, he started slurring and holding his head and insist on  going home.  He was almost lethargic to the point unable to keep awake so son drove him straight to the ER. In the emergency room significantly confused agitated.  Stroke workup negative.  Admitted with altered mental status.  Had taken 1 dose of tramadol  about 24 hours before the symptom onset. Type of Study: Bedside Swallow Evaluation Previous Swallow Assessment: none on file Diet Prior to this Study: NPO Temperature Spikes Noted: No Respiratory Status: Room air History of Recent Intubation: No Behavior/Cognition: Alert;Cooperative;Requires cueing Oral Cavity Assessment: Dry;Dried secretions Oral Care Completed by SLP: Yes Oral Cavity - Dentition: Dentures, top;Dentures, bottom;Other (Comment) (loosely fitting; needs denture adhesive to safely wear dentures; no adhesive available) Self-Feeding Abilities: Needs assist Patient Positioning: Upright in bed Baseline Vocal Quality: Normal Volitional Swallow: Able to elicit    Oral/Motor/Sensory Function Overall Oral Motor/Sensory Function: Generalized oral weakness   Ice Chips Ice chips: Impaired Presentation: Spoon Oral Phase Impairments: Reduced lingual movement/coordination Oral Phase Functional Implications: Prolonged oral transit Pharyngeal Phase Impairments: Suspected delayed Swallow;Multiple swallows   Thin Liquid Thin Liquid: Impaired Presentation: Cup Oral Phase Impairments: Reduced lingual  movement/coordination Oral Phase Functional Implications: Prolonged oral transit Pharyngeal  Phase Impairments: Suspected delayed Swallow;Multiple swallows    Nectar Thick Nectar Thick Liquid: Not tested   Honey Thick Honey Thick Liquid: Not tested   Puree Puree: Within functional limits   Solid     Solid: Impaired Presentation: Spoon Oral Phase Impairments: Reduced lingual movement/coordination;Impaired mastication Oral Phase Functional Implications: Prolonged oral transit Pharyngeal Phase Impairments: Suspected delayed Swallow;Multiple  swallows      Makyi Ledo H. MA, CCC-SLP Acute Rehabilitation Services    01/20/2024,7:59 AM

## 2024-01-20 NOTE — Hospital Course (Signed)
 Angel Keller is an 83 year old male with history of pancreatic cancer status post Whipple procedure, failure to thrive, hypertension.  He lives at home with daughter.  Patient was recently complaining of pain on Angel jaw.  He is followed by home hospice.  He was recently started on tramadol  and had taken 1 dose prior to hospitalization.  Upon going to lunch with Angel Keller he began developing lethargy and confusion. He was brought to the hospital for further evaluation. MRI brain was negative for acute findings although slightly motion degraded.  EEG was unremarkable. It was suspected that tramadol  may have contributed to Angel altered mentation from oversedation.  He is also prescribed Ativan  however has not yet started taking it and caution was stressed to family over future administration of this. He regained normal mentation after monitoring overnight and was felt to be back to Angel prior mental baseline. He was evaluated by PT and OT prior to discharge with recommendations for home health.  He will be resumed on hospice upon return home.

## 2024-01-20 NOTE — Progress Notes (Signed)
 RNCM notified [facility] of HH PT/OT needs, BSC.  [facility] following today, Jacqlyn Matas.

## 2024-01-20 NOTE — Evaluation (Signed)
 Physical Therapy Evaluation Patient Details Name: Angel PENKALA Sr. MRN: [id number] DOB: Mar 03, 1941 Today's Date: 01/20/2024  History of Present Illness  Pt is an 83 y.o. male presenting to [facility] ED on 01/18/24 for AMS and stroke like symptoms. Stroke workup negative. 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   Clinical Impression  Pt presents with condition above and deficits mentioned below, see PT Problem List. PTA, he was independent without DME with a hx of x4 falls in the past 6 months. He lives with his daughter in a 1-level house with 3 STE. Currently, the pt is demonstrating deficits still in cognition (mistaking son for son-in-law, decreased insight into deficits and safety), balance, and activity tolerance. He is currently benefiting from a RW with CGA for safety to ambulate. When he walks without an AD he tends to trip on his L foot and need up to minA to recover during LOB bouts. He shuffles his feet and has a kyphotic posture and does stutter his feet at times, almost displaying a Parkinsonian like gait pattern. Educated pt and family of his risk for falls and recs to have someone assist him with all standing mobility with gait belt provided and currently use a RW at all times upon d/c. Discussed exercising through walking, repeated sit <> stands, and leg movements to improve endurance and strength. They verbalized understanding. Recommending follow-up with HHPT. Will continue to follow acutely.        If plan is discharge home, recommend the following: A little help with walking and/or transfers;A little help with bathing/dressing/bathroom;Assistance with cooking/housework;Direct supervision/assist for medications management;Direct supervision/assist for financial management;Assist for transportation;Help with stairs or ramp for  entrance   Can travel by private vehicle        Equipment Recommendations None recommended by PT  Recommendations for Other Services       Functional Status Assessment Patient has had a recent decline in their functional status and demonstrates the ability to make significant improvements in function in a reasonable and predictable amount of time.     Precautions / Restrictions Precautions Precautions: Fall Restrictions Weight Bearing Restrictions Per Provider Order: No      Mobility  Bed Mobility Overal bed mobility: Needs Assistance Bed Mobility: Supine to Sit     Supine to sit: Contact guard, HOB elevated, Used rails     General bed mobility comments: Extra time to transition to sit R EOB from elevated HOB, CGA for safety    Transfers Overall transfer level: Needs assistance Equipment used: None Transfers: Sit to/from Stand Sit to Stand: Min assist, Contact guard assist           General transfer comment: Pt initially had a posterior lean and needing minA to stand up from EOB to no AD. Upon second rep from recliner to no AD he only needed CGA, displaying less of a posterior lean. He needed minA to transfer to stand from a low toilet though.    Ambulation/Gait Ambulation/Gait assistance: Min assist, Contact guard assist Gait Distance (Feet): 150 Feet (x3 bouts of ~150 ft > ~20 ft > ~20 ft) Assistive device: Rolling walker (2 wheels), None Gait Pattern/deviations: Step-through pattern, Decreased stride length, Decreased dorsiflexion - left, Shuffle, Trunk flexed Gait velocity: reduced Gait velocity interpretation: 1.31 - 2.62 ft/sec, indicative of limited community ambulator   General Gait Details: Pt ambulated ~20 ft the first bout  with a RW to try to reduce his posterior lean and improve his upright posture, which was successful and he was able to progress away from UE support. However, he maintains a kyphotic posture and takes shuffling steps with decreased  feet clearance, especially on the L. He tends to trip on his L foot often due to not clearing it adequately, then he is walking too fast and his body continues with the momentum when his foot gets stuck and he has a LOB, needing minA to recover. Cues provided to slow down and focus on clearing his foot. He stutters at times with his feet as well, almost displaying a Parkinsonian like gait pattern  Stairs            Wheelchair Mobility     Tilt Bed    Modified Rankin (Stroke Patients Only) Modified Rankin (Stroke Patients Only) Pre-Morbid Rankin Score: Slight disability Modified Rankin: Moderately severe disability     Balance Overall balance assessment: Mild deficits observed, not formally tested                                           Pertinent Vitals/Pain Pain Assessment Pain Assessment: Faces Faces Pain Scale: No hurt Pain Intervention(s): Monitored during session    Home Living Family/patient expects to be discharged to:: Private residence Living Arrangements: Children (daughter) Available Help at Discharge: Family;Available 24 hours/day Type of Home: House Home Access: Stairs to enter Entrance Stairs-Rails: None Entrance Stairs-Number of Steps: 3   Home Layout: One level Home Equipment: Agricultural consultant (2 wheels);Shower seat      Prior Function Prior Level of Function : Independent/Modified Independent;History of Falls (last six months)             Mobility Comments: No AD; x4 falls in past 6 months ADLs Comments: normally sharp cognitively and able to dress and bathe self per family     Extremity/Trunk Assessment   Upper Extremity Assessment Upper Extremity Assessment: Defer to OT evaluation    Lower Extremity Assessment Lower Extremity Assessment: Overall WFL for tasks assessed (MMT scores of >/= 4+/5 bil; sensation intact bil)    Cervical / Trunk Assessment Cervical / Trunk Assessment: Kyphotic  Communication    Communication Communication: Impaired Factors Affecting Communication: Hearing impaired    Cognition Arousal: Alert Behavior During Therapy: WFL for tasks assessed/performed   PT - Cognitive impairments: Awareness, Safety/Judgement, Memory                       PT - Cognition Comments: Appears to have decreased insight into his deficits in balance and safety. Pt mistaking his son for his son-in-law. Family reports pt is cognitively "sharp" at baseline Following commands: Intact       Cueing Cueing Techniques: Verbal cues     General Comments General comments (skin integrity, edema, etc.): educated pt and family of his risk for falls and recs to have someone assist him with all standing mobility with gait belt provided and currently use a RW at all times upon d/c. Discussed exercising through walking, repeated sit <> stands, and leg movements to improve endurance and strength.    Exercises     Assessment/Plan    PT Assessment Patient needs continued PT services  PT Problem List Decreased strength;Decreased activity tolerance;Decreased balance;Decreased mobility;Decreased coordination;Decreased cognition;Decreased knowledge of use of DME;Decreased safety awareness  PT Treatment Interventions DME instruction;Gait training;Stair training;Functional mobility training;Therapeutic activities;Therapeutic exercise;Balance training;Neuromuscular re-education;Cognitive remediation;Patient/family education    PT Goals (Current goals can be found in the Care Plan section)  Acute Rehab PT Goals Patient Stated Goal: to go home PT Goal Formulation: With patient/family Time For Goal Achievement: 02/03/24 Potential to Achieve Goals: Good    Frequency Min 2X/week     Co-evaluation               AM-PAC PT "6 Clicks" Mobility  Outcome Measure Help needed turning from your back to your side while in a flat bed without using bedrails?: A Little Help needed moving from  lying on your back to sitting on the side of a flat bed without using bedrails?: A Little Help needed moving to and from a bed to a chair (including a wheelchair)?: A Little Help needed standing up from a chair using your arms (e.g., wheelchair or bedside chair)?: A Little Help needed to walk in hospital room?: A Little Help needed climbing 3-5 steps with a railing? : A Little 6 Click Score: 18    End of Session Equipment Utilized During Treatment: Gait belt Activity Tolerance: Patient tolerated treatment well Patient left: with call bell/phone within reach;in chair;with chair alarm set Nurse Communication: Mobility status PT Visit Diagnosis: Unsteadiness on feet (R26.81);Other abnormalities of gait and mobility (R26.89);Muscle weakness (generalized) (M62.81);History of falling (Z91.81);Repeated falls (R29.6);Difficulty in walking, not elsewhere classified (R26.2)    Time: 1610-9604 PT Time Calculation (min) (ACUTE ONLY): 31 min   Charges:   PT Evaluation $PT Eval Moderate Complexity: 1 Mod PT Treatments $Gait Training: 8-22 mins PT General Charges $$ ACUTE PT VISIT: 1 Visit         Vernida Goodie, PT, DPT Acute Rehabilitation Services  Office: [phone]   Ellyn Hack 01/20/2024, 1:07 PM

## 2024-01-25 LAB — VITAMIN B1: Vitamin B1 (Thiamine): 71.7 nmol/L (ref 66.5–200.0)

## 2024-01-29 ENCOUNTER — Other Ambulatory Visit (HOSPITAL_COMMUNITY): Payer: Self-pay

## 2024-01-29 ENCOUNTER — Other Ambulatory Visit: Payer: Self-pay

## 2024-01-30 ENCOUNTER — Other Ambulatory Visit: Payer: Self-pay

## 2024-02-05 ENCOUNTER — Encounter: Payer: Self-pay | Admitting: Hematology

## 2024-03-09 ENCOUNTER — Other Ambulatory Visit (HOSPITAL_COMMUNITY): Payer: Self-pay

## 2024-03-09 ENCOUNTER — Other Ambulatory Visit: Payer: Self-pay

## 2024-03-11 ENCOUNTER — Other Ambulatory Visit: Payer: Self-pay

## 2024-03-11 ENCOUNTER — Other Ambulatory Visit (HOSPITAL_COMMUNITY): Payer: Self-pay

## 2024-03-12 ENCOUNTER — Other Ambulatory Visit: Payer: Self-pay

## 2024-04-03 ENCOUNTER — Other Ambulatory Visit (HOSPITAL_COMMUNITY): Payer: Self-pay

## 2024-04-03 ENCOUNTER — Other Ambulatory Visit: Payer: Self-pay

## 2024-04-03 MED ORDER — LORAZEPAM 0.5 MG PO TABS
0.5000 mg | ORAL_TABLET | Freq: Four times a day (QID) | ORAL | 3 refills | Status: AC | PRN
  Filled 2024-04-03: qty 120, 30d supply, fill #0
  Filled 2024-04-24 – 2024-05-01 (×2): qty 120, 30d supply, fill #1
  Filled 2024-05-01: qty 120, 30d supply, fill #2
  Filled 2024-05-19 – 2024-06-12 (×2): qty 120, 30d supply, fill #1

## 2024-04-03 MED ORDER — TOBRAMYCIN 0.3 % OP SOLN
2.0000 [drp] | OPHTHALMIC | 0 refills | Status: DC
  Filled 2024-04-03: qty 5, 9d supply, fill #0

## 2024-04-03 MED ORDER — LEVOTHYROXINE SODIUM 112 MCG PO TABS
112.0000 ug | ORAL_TABLET | Freq: Every morning | ORAL | 3 refills | Status: AC
  Filled 2024-04-03 – 2024-04-13 (×2): qty 30, 30d supply, fill #0
  Filled 2024-04-24 – 2024-05-15 (×2): qty 30, 30d supply, fill #1
  Filled 2024-06-27: qty 30, 30d supply, fill #2

## 2024-04-10 ENCOUNTER — Other Ambulatory Visit (HOSPITAL_COMMUNITY): Payer: Self-pay

## 2024-04-10 MED ORDER — LEVOTHYROXINE SODIUM 112 MCG PO TABS
112.0000 ug | ORAL_TABLET | Freq: Every morning | ORAL | 3 refills | Status: AC
  Filled 2024-04-10 – 2024-05-01 (×2): qty 30, 30d supply, fill #0

## 2024-04-11 ENCOUNTER — Other Ambulatory Visit (HOSPITAL_COMMUNITY): Payer: Self-pay

## 2024-04-13 ENCOUNTER — Other Ambulatory Visit (HOSPITAL_COMMUNITY): Payer: Self-pay

## 2024-04-17 ENCOUNTER — Other Ambulatory Visit (HOSPITAL_COMMUNITY): Payer: Self-pay

## 2024-04-17 MED ORDER — PANCREAZE 2600-8800 UNITS PO CPEP
1.0000 | ORAL_CAPSULE | Freq: Three times a day (TID) | ORAL | 3 refills | Status: AC
  Filled 2024-04-17: qty 90, 30d supply, fill #0
  Filled 2024-05-19: qty 100, 30d supply, fill #0

## 2024-04-17 MED ORDER — LEVOTHYROXINE SODIUM 112 MCG PO TABS
112.0000 ug | ORAL_TABLET | ORAL | 3 refills | Status: AC
  Filled 2024-04-24 – 2024-05-19 (×3): qty 30, 30d supply, fill #0

## 2024-04-18 ENCOUNTER — Other Ambulatory Visit: Payer: Self-pay

## 2024-04-19 ENCOUNTER — Other Ambulatory Visit (HOSPITAL_COMMUNITY): Payer: Self-pay

## 2024-04-19 ENCOUNTER — Other Ambulatory Visit: Payer: Self-pay

## 2024-04-20 ENCOUNTER — Other Ambulatory Visit (HOSPITAL_COMMUNITY): Payer: Self-pay

## 2024-04-20 MED ORDER — PANCREAZE 2600-8800 UNITS PO CPEP
1.0000 | ORAL_CAPSULE | ORAL | 3 refills | Status: AC
  Filled 2024-04-20 – 2024-04-23 (×2): qty 100, 30d supply, fill #0
  Filled 2024-05-01 – 2024-05-19 (×3): qty 100, 30d supply, fill #1

## 2024-04-22 ENCOUNTER — Other Ambulatory Visit (HOSPITAL_COMMUNITY): Payer: Self-pay

## 2024-04-23 ENCOUNTER — Other Ambulatory Visit (HOSPITAL_COMMUNITY): Payer: Self-pay

## 2024-04-23 ENCOUNTER — Other Ambulatory Visit: Payer: Self-pay

## 2024-04-24 ENCOUNTER — Other Ambulatory Visit (HOSPITAL_COMMUNITY): Payer: Self-pay

## 2024-04-24 ENCOUNTER — Other Ambulatory Visit: Payer: Self-pay

## 2024-05-01 ENCOUNTER — Other Ambulatory Visit: Payer: Self-pay

## 2024-05-01 ENCOUNTER — Other Ambulatory Visit (HOSPITAL_COMMUNITY): Payer: Self-pay

## 2024-05-02 ENCOUNTER — Other Ambulatory Visit: Payer: Self-pay

## 2024-05-06 ENCOUNTER — Other Ambulatory Visit (HOSPITAL_COMMUNITY): Payer: Self-pay

## 2024-05-06 ENCOUNTER — Other Ambulatory Visit: Payer: Self-pay

## 2024-05-08 ENCOUNTER — Other Ambulatory Visit (HOSPITAL_COMMUNITY): Payer: Self-pay

## 2024-05-09 ENCOUNTER — Other Ambulatory Visit (HOSPITAL_COMMUNITY): Payer: Self-pay

## 2024-05-09 ENCOUNTER — Other Ambulatory Visit: Payer: Self-pay

## 2024-05-09 MED ORDER — IPRATROPIUM-ALBUTEROL 0.5-2.5 (3) MG/3ML IN SOLN
3.0000 mL | Freq: Four times a day (QID) | RESPIRATORY_TRACT | 3 refills | Status: AC
  Filled 2024-05-09: qty 30, 3d supply, fill #0
  Filled 2024-05-09 – 2024-05-29 (×4): qty 90, 8d supply, fill #0
  Filled 2024-06-10 – 2024-06-27 (×2): qty 90, 8d supply, fill #1

## 2024-05-09 MED ORDER — LEVOTHYROXINE SODIUM 112 MCG PO TABS
112.0000 ug | ORAL_TABLET | Freq: Every morning | ORAL | 3 refills | Status: AC
  Filled 2024-05-09 – 2024-06-10 (×5): qty 30, 30d supply, fill #0
  Filled 2024-07-17 (×2): qty 30, 30d supply, fill #1

## 2024-05-13 ENCOUNTER — Other Ambulatory Visit: Payer: Self-pay

## 2024-05-14 ENCOUNTER — Other Ambulatory Visit: Payer: Self-pay

## 2024-05-16 ENCOUNTER — Other Ambulatory Visit (HOSPITAL_COMMUNITY): Payer: Self-pay

## 2024-05-16 ENCOUNTER — Other Ambulatory Visit: Payer: Self-pay

## 2024-05-20 ENCOUNTER — Other Ambulatory Visit (HOSPITAL_COMMUNITY): Payer: Self-pay

## 2024-05-20 ENCOUNTER — Other Ambulatory Visit: Payer: Self-pay

## 2024-05-21 ENCOUNTER — Other Ambulatory Visit: Payer: Self-pay

## 2024-05-21 ENCOUNTER — Encounter: Payer: Self-pay | Admitting: Pharmacist

## 2024-05-22 ENCOUNTER — Other Ambulatory Visit (HOSPITAL_COMMUNITY): Payer: Self-pay

## 2024-05-22 MED ORDER — PANCREAZE 2600-8800 UNITS PO CPEP
1.0000 | ORAL_CAPSULE | Freq: Every day | ORAL | 3 refills | Status: AC
  Filled 2024-05-22 – 2024-05-30 (×2): qty 30, 30d supply, fill #0
  Filled 2024-05-31 (×2): qty 100, 30d supply, fill #0
  Filled 2024-06-10 – 2024-06-27 (×2): qty 100, 30d supply, fill #1

## 2024-05-22 MED ORDER — TAMSULOSIN HCL 0.4 MG PO CAPS
0.4000 mg | ORAL_CAPSULE | Freq: Every day | ORAL | 0 refills | Status: DC
  Filled 2024-05-22 – 2024-05-30 (×3): qty 30, 30d supply, fill #0

## 2024-05-23 ENCOUNTER — Other Ambulatory Visit (HOSPITAL_COMMUNITY): Payer: Self-pay

## 2024-05-24 ENCOUNTER — Other Ambulatory Visit: Payer: Self-pay

## 2024-05-27 ENCOUNTER — Other Ambulatory Visit (HOSPITAL_COMMUNITY): Payer: Self-pay

## 2024-05-28 ENCOUNTER — Other Ambulatory Visit: Payer: Self-pay

## 2024-05-28 ENCOUNTER — Other Ambulatory Visit (HOSPITAL_COMMUNITY): Payer: Self-pay

## 2024-05-29 ENCOUNTER — Other Ambulatory Visit: Payer: Self-pay

## 2024-05-29 ENCOUNTER — Encounter: Payer: Self-pay | Admitting: Pharmacist

## 2024-05-29 ENCOUNTER — Other Ambulatory Visit (HOSPITAL_COMMUNITY): Payer: Self-pay

## 2024-05-30 ENCOUNTER — Other Ambulatory Visit (HOSPITAL_COMMUNITY): Payer: Self-pay

## 2024-05-30 ENCOUNTER — Other Ambulatory Visit: Payer: Self-pay

## 2024-05-31 ENCOUNTER — Other Ambulatory Visit (HOSPITAL_COMMUNITY): Payer: Self-pay

## 2024-06-10 ENCOUNTER — Other Ambulatory Visit (HOSPITAL_BASED_OUTPATIENT_CLINIC_OR_DEPARTMENT_OTHER): Payer: Self-pay

## 2024-06-10 ENCOUNTER — Other Ambulatory Visit: Payer: Self-pay

## 2024-06-11 ENCOUNTER — Encounter: Payer: Self-pay | Admitting: Pharmacist

## 2024-06-11 ENCOUNTER — Other Ambulatory Visit: Payer: Self-pay

## 2024-06-12 ENCOUNTER — Other Ambulatory Visit (HOSPITAL_COMMUNITY): Payer: Self-pay

## 2024-06-12 ENCOUNTER — Other Ambulatory Visit: Payer: Self-pay

## 2024-06-12 MED ORDER — LEVOTHYROXINE SODIUM 112 MCG PO TABS: 112.0000 ug | ORAL_TABLET | Freq: Every morning | ORAL | 3 refills | Status: AC

## 2024-06-12 MED ORDER — LORAZEPAM 0.5 MG PO TABS: 0.5000 mg | ORAL_TABLET | Freq: Four times a day (QID) | ORAL | 3 refills | Status: AC | PRN

## 2024-06-12 MED ORDER — TOBRAMYCIN 0.3 % OP SOLN
2.0000 [drp] | OPHTHALMIC | 0 refills | Status: AC
  Filled 2024-06-12 – 2024-06-27 (×2): qty 5, 9d supply, fill #0

## 2024-06-13 ENCOUNTER — Other Ambulatory Visit: Payer: Self-pay

## 2024-06-13 ENCOUNTER — Other Ambulatory Visit (HOSPITAL_COMMUNITY): Payer: Self-pay

## 2024-06-13 ENCOUNTER — Encounter: Payer: Self-pay | Admitting: Pharmacist

## 2024-06-17 ENCOUNTER — Other Ambulatory Visit (HOSPITAL_COMMUNITY): Payer: Self-pay

## 2024-06-18 ENCOUNTER — Other Ambulatory Visit: Payer: Self-pay

## 2024-06-19 ENCOUNTER — Encounter: Payer: Self-pay | Admitting: Hematology

## 2024-06-27 ENCOUNTER — Other Ambulatory Visit: Payer: Self-pay

## 2024-06-27 ENCOUNTER — Other Ambulatory Visit (HOSPITAL_COMMUNITY): Payer: Self-pay

## 2024-06-27 ENCOUNTER — Encounter (HOSPITAL_COMMUNITY): Payer: Self-pay

## 2024-06-27 ENCOUNTER — Other Ambulatory Visit (HOSPITAL_BASED_OUTPATIENT_CLINIC_OR_DEPARTMENT_OTHER): Payer: Self-pay

## 2024-06-27 MED ORDER — PANCREAZE 2600-8800 UNITS PO CPEP
1.0000 | ORAL_CAPSULE | Freq: Three times a day (TID) | ORAL | 3 refills | Status: AC
  Filled 2024-06-27: qty 100, 30d supply, fill #0

## 2024-06-27 MED ORDER — LORAZEPAM 0.5 MG PO TABS
0.5000 mg | ORAL_TABLET | Freq: Four times a day (QID) | ORAL | 0 refills | Status: AC | PRN
  Filled 2024-06-27: qty 60, 15d supply, fill #0

## 2024-06-28 ENCOUNTER — Encounter: Payer: Self-pay | Admitting: Pharmacist

## 2024-06-28 ENCOUNTER — Other Ambulatory Visit: Payer: Self-pay

## 2024-07-03 ENCOUNTER — Other Ambulatory Visit: Payer: Self-pay

## 2024-07-17 ENCOUNTER — Other Ambulatory Visit (HOSPITAL_COMMUNITY): Payer: Self-pay

## 2024-07-17 ENCOUNTER — Other Ambulatory Visit: Payer: Self-pay

## 2024-07-18 ENCOUNTER — Other Ambulatory Visit (HOSPITAL_COMMUNITY): Payer: Self-pay

## 2024-07-18 ENCOUNTER — Other Ambulatory Visit: Payer: Self-pay

## 2024-07-18 MED ORDER — TAMSULOSIN HCL 0.4 MG PO CAPS
0.4000 mg | ORAL_CAPSULE | Freq: Every day | ORAL | 0 refills | Status: DC
  Filled 2024-07-18 – 2024-07-31 (×3): qty 30, 30d supply, fill #0

## 2024-07-19 ENCOUNTER — Encounter: Payer: Self-pay | Admitting: Hematology

## 2024-07-19 ENCOUNTER — Other Ambulatory Visit (HOSPITAL_COMMUNITY): Payer: Self-pay

## 2024-07-19 ENCOUNTER — Other Ambulatory Visit: Payer: Self-pay

## 2024-07-19 MED ORDER — LORAZEPAM 0.5 MG PO TABS
0.5000 mg | ORAL_TABLET | Freq: Four times a day (QID) | ORAL | 0 refills | Status: AC | PRN
  Filled 2024-07-19: qty 60, 15d supply, fill #0

## 2024-07-20 ENCOUNTER — Other Ambulatory Visit (HOSPITAL_COMMUNITY): Payer: Self-pay

## 2024-07-22 ENCOUNTER — Other Ambulatory Visit: Payer: Self-pay

## 2024-07-24 ENCOUNTER — Other Ambulatory Visit: Payer: Self-pay

## 2024-07-25 ENCOUNTER — Other Ambulatory Visit: Payer: Self-pay

## 2024-07-25 ENCOUNTER — Other Ambulatory Visit (HOSPITAL_COMMUNITY): Payer: Self-pay

## 2024-07-25 MED ORDER — PANCREAZE 2600-8800 UNITS PO CPEP
1.0000 | ORAL_CAPSULE | Freq: Three times a day (TID) | ORAL | 3 refills | Status: AC
  Filled 2024-07-25: qty 100, 34d supply, fill #0

## 2024-07-25 MED ORDER — LORAZEPAM 0.5 MG PO TABS
0.5000 mg | ORAL_TABLET | Freq: Four times a day (QID) | ORAL | 0 refills | Status: AC | PRN
  Filled 2024-07-25 – 2024-07-31 (×2): qty 60, 15d supply, fill #0

## 2024-07-31 ENCOUNTER — Encounter: Payer: Self-pay | Admitting: Hematology

## 2024-07-31 ENCOUNTER — Other Ambulatory Visit (HOSPITAL_COMMUNITY): Payer: Self-pay

## 2024-07-31 ENCOUNTER — Other Ambulatory Visit: Payer: Self-pay

## 2024-08-01 ENCOUNTER — Other Ambulatory Visit: Payer: Self-pay

## 2024-08-09 ENCOUNTER — Other Ambulatory Visit (HOSPITAL_BASED_OUTPATIENT_CLINIC_OR_DEPARTMENT_OTHER): Payer: Self-pay

## 2024-08-09 ENCOUNTER — Other Ambulatory Visit (HOSPITAL_COMMUNITY): Payer: Self-pay

## 2024-08-09 MED ORDER — LEVOTHYROXINE SODIUM 112 MCG PO TABS
112.0000 ug | ORAL_TABLET | Freq: Every morning | ORAL | 3 refills | Status: AC
  Filled 2024-08-09: qty 30, 30d supply, fill #0

## 2024-08-21 ENCOUNTER — Other Ambulatory Visit (HOSPITAL_COMMUNITY): Payer: Self-pay

## 2024-08-24 ENCOUNTER — Other Ambulatory Visit (HOSPITAL_COMMUNITY): Payer: Self-pay

## 2024-08-26 ENCOUNTER — Other Ambulatory Visit (HOSPITAL_COMMUNITY): Payer: Self-pay

## 2024-08-26 MED ORDER — TAMSULOSIN HCL 0.4 MG PO CAPS
0.4000 mg | ORAL_CAPSULE | Freq: Every day | ORAL | 0 refills | Status: AC
  Filled 2024-08-26: qty 30, 30d supply, fill #0

## 2024-08-27 ENCOUNTER — Other Ambulatory Visit (HOSPITAL_COMMUNITY): Payer: Self-pay

## 2024-08-27 ENCOUNTER — Other Ambulatory Visit: Payer: Self-pay

## 2024-08-28 ENCOUNTER — Other Ambulatory Visit (HOSPITAL_COMMUNITY): Payer: Self-pay

## 2024-08-28 MED ORDER — PANCREAZE 2600-8800 UNITS PO CPEP
1.0000 | ORAL_CAPSULE | ORAL | 3 refills | Status: AC
  Filled 2024-08-28: qty 90, 30d supply, fill #0
  Filled 2024-08-30: qty 100, 30d supply, fill #0

## 2024-08-29 ENCOUNTER — Other Ambulatory Visit: Payer: Self-pay

## 2024-08-30 ENCOUNTER — Other Ambulatory Visit: Payer: Self-pay

## 2024-09-06 ENCOUNTER — Other Ambulatory Visit (HOSPITAL_COMMUNITY): Payer: Self-pay

## 2024-09-06 MED ORDER — MECLIZINE HCL 25 MG PO TABS
25.0000 mg | ORAL_TABLET | Freq: Four times a day (QID) | ORAL | 0 refills | Status: AC
  Filled 2024-09-06: qty 120, 30d supply, fill #0

## 2024-09-14 ENCOUNTER — Other Ambulatory Visit (HOSPITAL_COMMUNITY): Payer: Self-pay

## 2024-09-14 MED ORDER — LEVOTHYROXINE SODIUM 112 MCG PO TABS
112.0000 ug | ORAL_TABLET | Freq: Every morning | ORAL | 3 refills | Status: AC
  Filled 2024-09-14: qty 30, 30d supply, fill #0
